# Patient Record
Sex: Female | Born: 1940 | Race: Black or African American | Hispanic: No | Marital: Single | Smoking: Former smoker
Health system: Southern US, Community
[De-identification: ages and names within clinical notes are randomized; demographics above are authoritative.]

## PROBLEM LIST (undated history)

## (undated) DIAGNOSIS — F329 Major depressive disorder, single episode, unspecified: Secondary | ICD-10-CM

## (undated) DIAGNOSIS — K219 Gastro-esophageal reflux disease without esophagitis: Secondary | ICD-10-CM

## (undated) DIAGNOSIS — M1990 Unspecified osteoarthritis, unspecified site: Secondary | ICD-10-CM

## (undated) DIAGNOSIS — K279 Peptic ulcer, site unspecified, unspecified as acute or chronic, without hemorrhage or perforation: Secondary | ICD-10-CM

## (undated) DIAGNOSIS — M519 Unspecified thoracic, thoracolumbar and lumbosacral intervertebral disc disorder: Secondary | ICD-10-CM

## (undated) DIAGNOSIS — K429 Umbilical hernia without obstruction or gangrene: Secondary | ICD-10-CM

## (undated) DIAGNOSIS — D649 Anemia, unspecified: Secondary | ICD-10-CM

## (undated) DIAGNOSIS — J449 Chronic obstructive pulmonary disease, unspecified: Secondary | ICD-10-CM

## (undated) DIAGNOSIS — K5790 Diverticulosis of intestine, part unspecified, without perforation or abscess without bleeding: Secondary | ICD-10-CM

## (undated) DIAGNOSIS — Z9981 Dependence on supplemental oxygen: Secondary | ICD-10-CM

## (undated) DIAGNOSIS — G4733 Obstructive sleep apnea (adult) (pediatric): Secondary | ICD-10-CM

## (undated) DIAGNOSIS — I10 Essential (primary) hypertension: Secondary | ICD-10-CM

## (undated) DIAGNOSIS — F32A Depression, unspecified: Secondary | ICD-10-CM

## (undated) DIAGNOSIS — K648 Other hemorrhoids: Secondary | ICD-10-CM

## (undated) DIAGNOSIS — D3000 Benign neoplasm of unspecified kidney: Secondary | ICD-10-CM

## (undated) DIAGNOSIS — M21079 Valgus deformity, not elsewhere classified, unspecified ankle: Secondary | ICD-10-CM

## (undated) DIAGNOSIS — D126 Benign neoplasm of colon, unspecified: Secondary | ICD-10-CM

## (undated) DIAGNOSIS — E7800 Pure hypercholesterolemia, unspecified: Secondary | ICD-10-CM

## (undated) HISTORY — DX: Chronic obstructive pulmonary disease, unspecified: J44.9

## (undated) HISTORY — PX: INCISE AND DRAIN ABCESS: PRO64

## (undated) HISTORY — DX: Diverticulosis of intestine, part unspecified, without perforation or abscess without bleeding: K57.90

## (undated) HISTORY — DX: Anemia, unspecified: D64.9

## (undated) HISTORY — PX: COLONOSCOPY: SHX174

## (undated) HISTORY — PX: TOTAL KNEE ARTHROPLASTY: SHX125

## (undated) HISTORY — DX: Obstructive sleep apnea (adult) (pediatric): G47.33

## (undated) HISTORY — DX: Benign neoplasm of unspecified kidney: D30.00

## (undated) HISTORY — DX: Umbilical hernia without obstruction or gangrene: K42.9

## (undated) HISTORY — PX: LIPOMA RESECTION: SHX23

## (undated) HISTORY — DX: Benign neoplasm of colon, unspecified: D12.6

## (undated) HISTORY — DX: Other hemorrhoids: K64.8

## (undated) HISTORY — DX: Valgus deformity, not elsewhere classified, unspecified ankle: M21.079

---

## 1997-06-16 ENCOUNTER — Encounter: Admission: RE | Admit: 1997-06-16 | Discharge: 1997-09-14 | Payer: Self-pay | Admitting: Specialist

## 1997-08-18 ENCOUNTER — Encounter: Admission: RE | Admit: 1997-08-18 | Discharge: 1997-11-16 | Payer: Self-pay | Admitting: Specialist

## 1997-10-08 ENCOUNTER — Encounter: Admission: RE | Admit: 1997-10-08 | Discharge: 1998-01-06 | Payer: Self-pay | Admitting: Specialist

## 1998-04-24 ENCOUNTER — Other Ambulatory Visit: Admission: RE | Admit: 1998-04-24 | Discharge: 1998-04-24 | Payer: Self-pay | Admitting: Internal Medicine

## 1998-05-07 ENCOUNTER — Encounter: Payer: Self-pay | Admitting: Internal Medicine

## 1998-05-07 ENCOUNTER — Ambulatory Visit (HOSPITAL_COMMUNITY): Admission: RE | Admit: 1998-05-07 | Discharge: 1998-05-07 | Payer: Self-pay | Admitting: Internal Medicine

## 1999-01-11 HISTORY — PX: UPPER GASTROINTESTINAL ENDOSCOPY: SHX188

## 1999-05-04 ENCOUNTER — Encounter: Payer: Self-pay | Admitting: Specialist

## 1999-05-04 ENCOUNTER — Ambulatory Visit (HOSPITAL_COMMUNITY): Admission: RE | Admit: 1999-05-04 | Discharge: 1999-05-04 | Payer: Self-pay | Admitting: Specialist

## 1999-05-18 ENCOUNTER — Ambulatory Visit (HOSPITAL_COMMUNITY): Admission: RE | Admit: 1999-05-18 | Discharge: 1999-05-18 | Payer: Self-pay | Admitting: Specialist

## 1999-05-18 ENCOUNTER — Encounter: Payer: Self-pay | Admitting: Specialist

## 1999-05-27 ENCOUNTER — Encounter: Admission: RE | Admit: 1999-05-27 | Discharge: 1999-05-27 | Payer: Self-pay | Admitting: Internal Medicine

## 1999-05-27 ENCOUNTER — Encounter: Payer: Self-pay | Admitting: Internal Medicine

## 1999-06-17 ENCOUNTER — Other Ambulatory Visit: Admission: RE | Admit: 1999-06-17 | Discharge: 1999-06-17 | Payer: Self-pay | Admitting: Internal Medicine

## 1999-07-28 ENCOUNTER — Encounter (INDEPENDENT_AMBULATORY_CARE_PROVIDER_SITE_OTHER): Payer: Self-pay | Admitting: *Deleted

## 1999-07-28 ENCOUNTER — Ambulatory Visit (HOSPITAL_COMMUNITY): Admission: RE | Admit: 1999-07-28 | Discharge: 1999-07-28 | Payer: Self-pay | Admitting: Gastroenterology

## 1999-11-11 ENCOUNTER — Encounter (INDEPENDENT_AMBULATORY_CARE_PROVIDER_SITE_OTHER): Payer: Self-pay | Admitting: Specialist

## 1999-11-11 ENCOUNTER — Ambulatory Visit (HOSPITAL_COMMUNITY): Admission: RE | Admit: 1999-11-11 | Discharge: 1999-11-11 | Payer: Self-pay | Admitting: Gastroenterology

## 1999-11-21 ENCOUNTER — Encounter (INDEPENDENT_AMBULATORY_CARE_PROVIDER_SITE_OTHER): Payer: Self-pay | Admitting: *Deleted

## 2000-06-06 ENCOUNTER — Encounter: Payer: Self-pay | Admitting: Internal Medicine

## 2000-06-06 ENCOUNTER — Encounter: Admission: RE | Admit: 2000-06-06 | Discharge: 2000-06-06 | Payer: Self-pay | Admitting: Internal Medicine

## 2001-06-08 ENCOUNTER — Encounter: Admission: RE | Admit: 2001-06-08 | Discharge: 2001-06-08 | Payer: Self-pay | Admitting: Internal Medicine

## 2001-06-08 ENCOUNTER — Encounter: Payer: Self-pay | Admitting: Internal Medicine

## 2002-03-20 ENCOUNTER — Encounter: Payer: Self-pay | Admitting: Specialist

## 2002-03-26 ENCOUNTER — Inpatient Hospital Stay (HOSPITAL_COMMUNITY): Admission: RE | Admit: 2002-03-26 | Discharge: 2002-03-29 | Payer: Self-pay | Admitting: Specialist

## 2002-03-26 ENCOUNTER — Encounter: Payer: Self-pay | Admitting: Specialist

## 2002-03-29 ENCOUNTER — Inpatient Hospital Stay (HOSPITAL_COMMUNITY)
Admission: RE | Admit: 2002-03-29 | Discharge: 2002-04-06 | Payer: Self-pay | Admitting: Physical Medicine & Rehabilitation

## 2002-06-19 ENCOUNTER — Encounter: Admission: RE | Admit: 2002-06-19 | Discharge: 2002-06-19 | Payer: Self-pay | Admitting: Internal Medicine

## 2002-06-19 ENCOUNTER — Encounter: Payer: Self-pay | Admitting: Internal Medicine

## 2002-10-15 ENCOUNTER — Encounter: Payer: Self-pay | Admitting: Specialist

## 2002-10-15 ENCOUNTER — Encounter: Admission: RE | Admit: 2002-10-15 | Discharge: 2002-10-15 | Payer: Self-pay | Admitting: Specialist

## 2003-11-25 ENCOUNTER — Encounter: Admission: RE | Admit: 2003-11-25 | Discharge: 2003-11-25 | Payer: Self-pay | Admitting: Internal Medicine

## 2005-01-10 HISTORY — PX: UMBILICAL HERNIA REPAIR: SHX196

## 2005-01-19 ENCOUNTER — Encounter: Admission: RE | Admit: 2005-01-19 | Discharge: 2005-01-19 | Payer: Self-pay | Admitting: Internal Medicine

## 2005-02-03 ENCOUNTER — Encounter: Admission: RE | Admit: 2005-02-03 | Discharge: 2005-02-03 | Payer: Self-pay | Admitting: Specialist

## 2005-11-03 ENCOUNTER — Encounter: Admission: RE | Admit: 2005-11-03 | Discharge: 2005-11-03 | Payer: Self-pay | Admitting: Specialist

## 2005-11-25 ENCOUNTER — Inpatient Hospital Stay (HOSPITAL_COMMUNITY): Admission: AD | Admit: 2005-11-25 | Discharge: 2005-12-02 | Payer: Self-pay | Admitting: General Surgery

## 2005-11-25 ENCOUNTER — Encounter (INDEPENDENT_AMBULATORY_CARE_PROVIDER_SITE_OTHER): Payer: Self-pay | Admitting: *Deleted

## 2005-11-25 ENCOUNTER — Encounter (INDEPENDENT_AMBULATORY_CARE_PROVIDER_SITE_OTHER): Payer: Self-pay | Admitting: Specialist

## 2006-01-04 ENCOUNTER — Encounter (INDEPENDENT_AMBULATORY_CARE_PROVIDER_SITE_OTHER): Payer: Self-pay | Admitting: *Deleted

## 2006-03-06 ENCOUNTER — Encounter: Admission: RE | Admit: 2006-03-06 | Discharge: 2006-03-06 | Payer: Self-pay | Admitting: Internal Medicine

## 2006-05-26 ENCOUNTER — Encounter: Admission: RE | Admit: 2006-05-26 | Discharge: 2006-05-26 | Payer: Self-pay | Admitting: Specialist

## 2006-10-10 ENCOUNTER — Inpatient Hospital Stay (HOSPITAL_COMMUNITY): Admission: RE | Admit: 2006-10-10 | Discharge: 2006-10-19 | Payer: Self-pay | Admitting: Specialist

## 2006-10-11 ENCOUNTER — Ambulatory Visit: Payer: Self-pay | Admitting: Physical Medicine & Rehabilitation

## 2007-03-09 ENCOUNTER — Encounter: Admission: RE | Admit: 2007-03-09 | Discharge: 2007-03-09 | Payer: Self-pay | Admitting: Internal Medicine

## 2008-01-16 ENCOUNTER — Ambulatory Visit (HOSPITAL_COMMUNITY): Admission: RE | Admit: 2008-01-16 | Discharge: 2008-01-16 | Payer: Self-pay | Admitting: Specialist

## 2008-03-10 ENCOUNTER — Encounter: Admission: RE | Admit: 2008-03-10 | Discharge: 2008-03-10 | Payer: Self-pay | Admitting: Internal Medicine

## 2008-06-27 ENCOUNTER — Ambulatory Visit (HOSPITAL_COMMUNITY): Admission: RE | Admit: 2008-06-27 | Discharge: 2008-06-27 | Payer: Self-pay | Admitting: Psychiatry

## 2009-01-10 DIAGNOSIS — D3000 Benign neoplasm of unspecified kidney: Secondary | ICD-10-CM

## 2009-01-10 HISTORY — DX: Benign neoplasm of unspecified kidney: D30.00

## 2009-03-20 ENCOUNTER — Ambulatory Visit (HOSPITAL_COMMUNITY): Admission: RE | Admit: 2009-03-20 | Discharge: 2009-03-20 | Payer: Self-pay | Admitting: Urology

## 2009-03-26 ENCOUNTER — Encounter: Admission: RE | Admit: 2009-03-26 | Discharge: 2009-03-26 | Payer: Self-pay | Admitting: Internal Medicine

## 2009-04-15 ENCOUNTER — Encounter: Admission: RE | Admit: 2009-04-15 | Discharge: 2009-04-15 | Payer: Self-pay | Admitting: Urology

## 2009-06-16 ENCOUNTER — Ambulatory Visit (HOSPITAL_COMMUNITY): Admission: RE | Admit: 2009-06-16 | Discharge: 2009-06-16 | Payer: Self-pay | Admitting: Interventional Radiology

## 2009-07-17 ENCOUNTER — Encounter (INDEPENDENT_AMBULATORY_CARE_PROVIDER_SITE_OTHER): Payer: Self-pay | Admitting: Interventional Radiology

## 2009-07-17 ENCOUNTER — Observation Stay (HOSPITAL_COMMUNITY)
Admission: RE | Admit: 2009-07-17 | Discharge: 2009-07-18 | Payer: Self-pay | Source: Home / Self Care | Admitting: Interventional Radiology

## 2009-08-07 ENCOUNTER — Encounter: Payer: Self-pay | Admitting: Internal Medicine

## 2009-08-26 ENCOUNTER — Ambulatory Visit (HOSPITAL_COMMUNITY): Admission: RE | Admit: 2009-08-26 | Discharge: 2009-08-26 | Payer: Self-pay | Admitting: Interventional Radiology

## 2009-08-26 ENCOUNTER — Encounter: Admission: RE | Admit: 2009-08-26 | Discharge: 2009-08-26 | Payer: Self-pay | Admitting: Interventional Radiology

## 2009-08-26 ENCOUNTER — Encounter (INDEPENDENT_AMBULATORY_CARE_PROVIDER_SITE_OTHER): Payer: Self-pay | Admitting: *Deleted

## 2010-01-31 ENCOUNTER — Encounter: Payer: Self-pay | Admitting: Interventional Radiology

## 2010-02-16 ENCOUNTER — Telehealth: Payer: Self-pay | Admitting: Radiology

## 2010-02-17 ENCOUNTER — Other Ambulatory Visit: Payer: Self-pay | Admitting: Interventional Radiology

## 2010-02-17 ENCOUNTER — Other Ambulatory Visit (HOSPITAL_COMMUNITY): Payer: Self-pay | Admitting: Interventional Radiology

## 2010-02-17 ENCOUNTER — Encounter (INDEPENDENT_AMBULATORY_CARE_PROVIDER_SITE_OTHER): Payer: Self-pay | Admitting: *Deleted

## 2010-02-17 DIAGNOSIS — N2889 Other specified disorders of kidney and ureter: Secondary | ICD-10-CM

## 2010-02-25 NOTE — Letter (Signed)
Summary: New Patient letter  [facility]  [address] [place], [place] [address]   Phone: [phone]  Fax: [phone]       02/17/2010 MRN: [id number]  [facility] [address] [place], [place]  [address]  Dear Ms. Lampton,  Welcome to the Gastroenterology Division at [facility].    You are scheduled to see Dr.  Carlean Purl on 03-23-10 at 3:00P.M. on the 3rd floor at [facility], [place] [employer].  We ask that you try to arrive at our office 15 minutes prior to your appointment time to allow for check-in.  We would like you to complete the enclosed self-administered evaluation form prior to your visit and bring it with you on the day of your appointment.  We will review it with you.  Also, please bring a complete list of all your medications or, if you prefer, bring the medication bottles and we will list them.  Please bring your insurance card so that we may make a copy of it.  If your insurance requires a referral to see a specialist, please bring your referral form from your primary care physician.  Co-payments are due at the time of your visit and may be paid by cash, check or credit card.     Your office visit will consist of a consult with your physician (includes a physical exam), any laboratory testing he/she may order, scheduling of any necessary diagnostic testing (e.g. x-ray, ultrasound, CT-scan), and scheduling of a procedure (e.g. Endoscopy, Colonoscopy) if required.  Please allow enough time on your schedule to allow for any/all of these possibilities.    If you cannot keep your appointment, please call [phone] to cancel or reschedule prior to your appointment date.  This allows [place] the opportunity to schedule an appointment for another patient in need of care.  If you do not cancel or reschedule by 5 p.m. the business day prior to your appointment date, you will be charged a $50.00 late cancellation/no-show fee.    Thank you for choosing [facility] for your medical needs.  We appreciate the opportunity to care for you.  Please visit [place] at our website  to learn more about our practice.                     Sincerely,                                                             The Gastroenterology Division

## 2010-03-02 ENCOUNTER — Ambulatory Visit
Admission: RE | Admit: 2010-03-02 | Discharge: 2010-03-02 | Disposition: A | Discharge status: Home / Self Care | Payer: MEDICARE | Source: Ambulatory Visit | Attending: Interventional Radiology | Admitting: Interventional Radiology

## 2010-03-02 ENCOUNTER — Ambulatory Visit (HOSPITAL_COMMUNITY)
Admission: RE | Admit: 2010-03-02 | Discharge: 2010-03-02 | Disposition: A | Discharge status: Home / Self Care | Payer: MEDICARE | Source: Ambulatory Visit | Attending: Interventional Radiology | Admitting: Interventional Radiology

## 2010-03-02 ENCOUNTER — Encounter (INDEPENDENT_AMBULATORY_CARE_PROVIDER_SITE_OTHER): Payer: Self-pay | Admitting: *Deleted

## 2010-03-02 VITALS — BP 195/78 | HR 72 | Temp 98.8°F | Resp 20 | Ht 61.0 in | Wt 200.0 lb

## 2010-03-02 DIAGNOSIS — N2889 Other specified disorders of kidney and ureter: Secondary | ICD-10-CM

## 2010-03-02 DIAGNOSIS — N3946 Mixed incontinence: Secondary | ICD-10-CM | POA: Insufficient documentation

## 2010-03-02 DIAGNOSIS — Z483 Aftercare following surgery for neoplasm: Secondary | ICD-10-CM | POA: Insufficient documentation

## 2010-03-02 DIAGNOSIS — C649 Malignant neoplasm of unspecified kidney, except renal pelvis: Secondary | ICD-10-CM | POA: Insufficient documentation

## 2010-03-02 DIAGNOSIS — R351 Nocturia: Secondary | ICD-10-CM | POA: Insufficient documentation

## 2010-03-02 DIAGNOSIS — R350 Frequency of micturition: Secondary | ICD-10-CM | POA: Insufficient documentation

## 2010-03-02 HISTORY — DX: Unspecified thoracic, thoracolumbar and lumbosacral intervertebral disc disorder: M51.9

## 2010-03-02 HISTORY — DX: Peptic ulcer, site unspecified, unspecified as acute or chronic, without hemorrhage or perforation: K27.9

## 2010-03-02 HISTORY — DX: Gastro-esophageal reflux disease without esophagitis: K21.9

## 2010-03-02 HISTORY — DX: Depression, unspecified: F32.A

## 2010-03-02 HISTORY — DX: Major depressive disorder, single episode, unspecified: F32.9

## 2010-03-02 HISTORY — DX: Unspecified osteoarthritis, unspecified site: M19.90

## 2010-03-02 HISTORY — DX: Essential (primary) hypertension: I10

## 2010-03-02 HISTORY — DX: Pure hypercholesterolemia, unspecified: E78.00

## 2010-03-02 MED ORDER — IOHEXOL 300 MG/ML  SOLN: 50.0000 mL | Freq: Once | INTRAMUSCULAR | Status: DC | PRN

## 2010-03-04 ENCOUNTER — Other Ambulatory Visit: Payer: Self-pay | Admitting: Interventional Radiology

## 2010-03-04 DIAGNOSIS — N2889 Other specified disorders of kidney and ureter: Secondary | ICD-10-CM

## 2010-03-04 NOTE — Progress Notes (Signed)
Encounter addended by: Andris Baumann on: 03/04/2010 11:48 AM<BR>     Documentation filed: Follow-up Section

## 2010-03-05 ENCOUNTER — Other Ambulatory Visit: Payer: Self-pay | Admitting: Internal Medicine

## 2010-03-05 DIAGNOSIS — Z1231 Encounter for screening mammogram for malignant neoplasm of breast: Secondary | ICD-10-CM

## 2010-03-23 ENCOUNTER — Ambulatory Visit (INDEPENDENT_AMBULATORY_CARE_PROVIDER_SITE_OTHER): Payer: MEDICARE | Admitting: Internal Medicine

## 2010-03-23 ENCOUNTER — Encounter: Payer: Self-pay | Admitting: Internal Medicine

## 2010-03-23 DIAGNOSIS — R159 Full incontinence of feces: Secondary | ICD-10-CM

## 2010-03-23 DIAGNOSIS — R198 Other specified symptoms and signs involving the digestive system and abdomen: Secondary | ICD-10-CM

## 2010-03-23 DIAGNOSIS — R194 Change in bowel habit: Secondary | ICD-10-CM

## 2010-03-23 NOTE — Discharge Summary (Signed)
Summary: Abdominal Wall Abcess  NAME:  [name], [name]                 ACCOUNT NO.:  [id number]   MEDICAL RECORD NO.:  [id number]          PATIENT TYPE:  INP   LOCATION:  20                         FACILITY:  [facility]   PHYSICIAN:  Sammuel Hines. Daiva Nakayama, M.D. DATE OF BIRTH:  [birth year]   DATE OF ADMISSION:  11/25/2005  DATE OF DISCHARGE:  12/02/2005                               DISCHARGE SUMMARY   HISTORY OF PRESENT ILLNESS:  [name] is a 70 year old diabetic female  who was admitted with strangulated umbilical hernia that had eroded  through the abdominal wall and created an abdominal wall abscess.   HOSPITAL COURSE:  She was taken to the operating room on November 16  where she underwent resection of strangulated omentum as well as her  umbilicus, incision and debridement of the abdominal wall, repair of the  umbilical hernia primarily and the wound was left open and packed with  dressings. Postoperatively, she did relatively well. Her diabetes was  controlled with insulin sliding scale. Her dressing changes were started  on postop day #1. Once her bowel function had returned, her diet was  gradually advanced. Home health nursing was arranged for dressing  changes at home and on November 23, she was tolerating a diet and ready  for home health dressing changes. She was out of bed ambulating and was  ready for discharge home.   DISCHARGE MEDICATIONS:  She is to resume her home medications, given a  prescription for Vicodin for pain.   DISCHARGE INSTRUCTIONS:  Activities:  No heavy lifting. Diet:  Diabetic  diet. Followup will be with Dr. Marlou Starks in 2 weeks.   FINAL DIAGNOSIS:  Abdominal wall abscess from strangulated umbilical  hernia.   CONDITION ON DISCHARGE:  Stable.      Sammuel Hines. Daiva Nakayama, M.D.  Electronically Signed     PST/MEDQ  D:  01/04/2006  T:  01/04/2006  Job:  [id number]   cc:   Sammuel Hines. Daiva Nakayama, M.D.  [id number]  [place]  [place] [address]

## 2010-03-23 NOTE — Progress Notes (Signed)
Subjective:    Patient ID: [name], female    DOB: 04/14/1940, 70 y.o.   MRN: [id number]  HPI  She has been having problems with defecating every times she urinates. Very small amount of stool comes out. She will also have an urge to defecate and have normal bowel movements. She believes she has had a colonoscopy in the past but does not remember where or by whom. Chart review shows 2001 colonoscopy and polypectomy by [facility] and she thinks there has been a subsequent one. She Sees Dr. Reynaldo Minium for primary care but Dr. Terrall Laity office referred her (his PA). She has been seeing them after a knee replacement.  She does think bowel habits are more urgent and frequent and has had incontinence. Stools are loose at times. No treatment tried. No rectal bleeding. Does have some gas and boating. She is unclear as to actual duration or onset of problems. She acknowledges memory disturbance.  Review of Systems    Unable to sleep lying down, arthritis and back pain, recent URI with cough, urinary frequency , pedal edema, uses a walker and a right leg brace for chronic foot deformities and gait problems Complete ROS otherwise negative except as in HPI   Past Medical History  Diagnosis Date  . Arthritis   . Lumbar disc disease   . Asthma   . Depression   . Diabetes mellitus   . GERD (gastroesophageal reflux disease)   . Hypercholesterolemia   . HTN (hypertension)   . Peptic ulcer disease   . Valgus foot     right foot  . Gastric ulcer   . Adenomatous colon polyp   . Diverticulosis   . Internal hemorrhoids   . 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 Anemia   . Anemia    History   Social History  . Marital Status: Single    Spouse Name: N/A    Number of Children: 0  . Years of Education: N/A   Occupational History  . Retired     Astronomer   Social History Main Topics  . Smoking status: Former Smoker -- 2.0 packs/day for 5 years    Types: Cigarettes    Quit date:  01/10/1958  . Smokeless tobacco: Not on file  . Alcohol Use: No     Quit when she quit smoking  . Drug Use: No  . Sexually Active: Not on file   Other Topics Concern  . 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  , Disp: , Rfl: ;  Carbonyl Iron (FEOSOL PO), One tablet by mouth once a day , Disp: , Rfl: ;  donepezil (ARICEPT) 5 MG tablet, 5 mg. 2 tablets by mouth daily , Disp: , Rfl: ;  esomeprazole (NEXIUM) 40 MG capsule, One tablet by mouth once a day , Disp: , Rfl:  exenatide (BYETTA 10 MCG PEN) 10 MCG/0.04ML SOLN, 79mcg subQ twice a day , Disp: , Rfl: ;  folic acid (FOLVITE) Q000111Q MCG tablet, 400 mcg. One tablet 1-2 x per week , Disp: , Rfl: ;  furosemide (LASIX) 40 MG tablet, 2 tablets by mouth daily , Disp: , Rfl: ;  glipiZIDE (GLUCOTROL) 10 MG tablet, One tablet by mouth twice a day , Disp: , Rfl:  HYDROcodone-acetaminophen (VICODIN ES) 7.5-750 MG per tablet, One tablet by  mouth 1-2 x daily as needed , Disp: , Rfl: ;  LORazepam (ATIVAN) 0.5 MG tablet, One tablet by mouth at bedtime , Disp: , Rfl: ;  metFORMIN (GLUCOPHAGE-XR) 500 MG 24 hr tablet, 2 tablets by mouth twice a day , Disp: , Rfl: ;  polyethylene glycol (MIRALAX / GLYCOLAX) packet, One capful daily , Disp: , Rfl:  potassium chloride (KLOR-CON) 20 MEQ packet, One tablet by mouth daily , Disp: , Rfl: ;  Sennosides (SENNA LAX PO), Take by mouth. One tablet by mouth once daily , Disp: , Rfl: ;  vitamin E 400 UNIT capsule, Take 400 Units by mouth daily.  , Disp: , Rfl: ;  methocarbamol (ROBAXIN) 500 MG tablet, One tablet by mouth every 8 hours as needed , Disp: , Rfl:  No Known Allergies  Family Hx: + diabetes and heart disease Objective:   Physical Exam     Elderly black woman , no acute distress   eyes anicteric and mouth and posterior pharynx clear Neck supple and no  mass, no cervical or supraclavicular adenopathy  lungs clear Heart S1-S2 no rubs murmurs or gallops   abdomen is protuberant soft and nontender without obvious mass   rectal exam with female staff present shows normal anoderm , slightly reduced resting and voluntary squeeze tone , slightly reduced descent with defecation simulation, Abdominal pressure appears adequate. No mass or signifcant rectocele. Ext: brace on RLE, everted feet with Charcot changes, able to walk with assistance, slow getting on table Neuro: memory inconsistent on recall, variable reporting on history. Affect: pleasant Skin: no rash      Assessment & Plan:   Problem List as of 03/23/2010        Fecal incontinence   Last Assessment & Plan Note   03/23/2010 Documentation Signed 03/23/2010  6:14 PM by Gatha Mayer    Urge incontinence in setting of more frequent defecation. Given her memory loss issues, ? Dementia, it is hard to sort this out. i do not have last colonoscopy report and have requested that. Some possibilities include overuse of MiraLax and/or Senna, side effects of Aricept. She is also on metformin. It seems like this has been a fairly long-standing problem. Rectal exam shows some decreased anal sphincter tone (subjective) but not that bad in my opinion. I do not think she is having urinary incontinence but may have an overactive bladder. Await record review prior to anything further. Anticipate contacting Dr. Reynaldo Minium also.    Change in bowel habits   Last Assessment & Plan Note   03/23/2010 Documentation Signed 03/23/2010  6:14 PM by Gatha Mayer    Increased frequency of defecation. Sometimes may be loose. Do not get sense that there is frank diarrhea but history seems somewhat unreliable. Aricept side effects, MiraLax/Senna may be part of the problem. Await review of previous colonoscopy.

## 2010-03-23 NOTE — Procedures (Signed)
Summary: COLON/Bx (Dr Earlean Shawl)    Colonoscopy  Procedure date:  11/21/1999  Findings:      Location:  [facility]  Patient:    [name], [name]                        MRN: [id number] Proc. Date: 11/11/99 Adm. Date:  [id number] Attending:  Harriette Bouillon CC:         Richard A. Reynaldo Minium, M.D.   Procedure Report  PROCEDURE:  Colonoscopic polypectomy.  INDICATION:  A 70 year old African-American female undergoing colonoscopy for neoplasia surveillance and to further evaluate symptoms of hematochezia with tenesmus and proctalgia.  No prior colorectal neoplasia surveillance.  DESCRIPTION OF PROCEDURE:  After reviewing the nature of the procedure with the patient, including potential risks and complications, and after discussing alternative methods of diagnosis and treatment, informed consent was signed.  The patient was premedicated, receiving Versed 7 mg, fentanyl 75 mcg IV, administered in divided doses prior to and during the course of the procedure.  Using an Olympus standard video colonoscope, I intubated after digital examination revealed external hemorrhoids.  The scope was inserted and advanced easily around the entire length of the colon to the cecum. Preparation was excellent throughout.  Cecum was identified by the appendiceal orifice and ileocecal valve.  The scope was slowly withdrawn, with careful inspection of the entire colon in a retrograde manner, including retroflex view in the rectal vault.  Photo documentation was obtained in the cecum.  Abnormalities included two polyps, one at 70 cm in the descending colon and a second at the anorectal verge.  Both were approximately 8 mm in diameter, sessile, and benign-appearing.  Both were resected using hot biopsy forceps, recovered, and submitted in their individual pathology container.  Moderate sigmoid diverticulosis was noted.  There was no inflammation.   No evidence of diverticular disease in any other segments of the colon.  Finally, in the rectum, upon retroflex view, internal hemorrhoids were noted at the anus.  These were noninflamed at this time.  The colon was decompressed, scope withdrawn.  The patient tolerated the procedure without difficulty, being maintained on Datascope monitor and low-flow oxygen throughout.  Time 1, technical 2, preparation 1, total score equals 4.  ASSESSMENT: 1. Colon polyps, benign-appearing.  Resected from the descending colon and    anorectal verge using hot biopsy forceps.  Recovered and submitted to    pathology.  Pathology pending. 2. Diverticulosis, moderate sigmoid involvement.  Noninflamed. 3. Internal hemorrhoids, probable source of hematochezia.  RECOMMENDATIONS: 1. Postpolypectomy instructions reviewed. 2. Follow up pathology. 3. Repeat colonoscopy in three years if adenoma. 4. Annual Hemoccult, per Dr. Reynaldo Minium. 5. Anusol-HC suppository one p.r. b.i.d. for seven days, repeat p.r.n.    for hematochezia. 6. ROV two week two review findings and pathology results with patient. DD:  11/11/99 TD:  11/12/99 Job: [id number]  ------------------------------------------------------------------------------------------------------------------------------------------------------  FINAL DIAGNOSIS    ***MICROSCOPIC EXAMINATION AND DIAGNOSIS***    I. COLON: ADENOMATOUS POLYP. NO HIGH GRADE DYSPLASIA OR   INVASIVE MALIGNANCY IDENTIFIED.    II. COLON: HYPERPLASTIC POLYP. NO ADENOMATOUS CHANGE OR   MALIGNANCY IDENTIFIED.    smr   Date Reported: 11/12/99 Louanne Belton. Rodney Cruise, MD   *** Electronically Signed Out By EAA ***    Clinical information  R/O neoplasm. (tmc)    specimen(s) obtained   1: Colon, polyp, descending   2: Rectum, polyp    Gross Description   I. Received in formalin is a tan, soft tissue fragment that is   submitted in toto. Size: 0.2 cm    II. Received in formalin is  a tan, soft tissue fragment that is   submitted in toto. Size: 0.2 cm   (JBM:smr 11/1)    smr/

## 2010-03-23 NOTE — Op Note (Signed)
Summary: I&D Abdominal Wall Abscess  NAME:  [name], [name]                 ACCOUNT NO.:  [id number]   MEDICAL RECORD NO.:  [id number]          PATIENT TYPE:  INP   LOCATION:  1611                         FACILITY:  [facility]   PHYSICIAN:  Sammuel Hines. Daiva Nakayama, M.D. DATE OF BIRTH:  Jun 04, 1940   DATE OF PROCEDURE:  11/25/2005  DATE OF DISCHARGE:                               OPERATIVE REPORT   PREOPERATIVE DIAGNOSIS:  Incarcerated umbilical hernia with abscess.   POSTOPERATIVE DIAGNOSIS:  Incarcerated umbilical hernia with abscess.   PROCEDURE:  I&D of abdominal wall abscess, resection of small piece of  omentum and primary repair of umbilical hernia.   SURGEON:  Sammuel Hines. Marlou Starks, M.D.   ANESTHESIA:  General endotracheal.   PROCEDURE:  After informed consent was obtained, the patient was brought  to the operating room, placed in supine position on the operating room  table.  After induction of general anesthesia, the patient's abdomen was  prepped with Betadine and draped in usual sterile manner.  The patient  had an open draining wound at the umbilicus.  The abscess cavity was  probed with a hemostat superiorly and inferiorly and the cavity was  opened along the midline sharply with a 10 blade knife and  electrocautery.  Once the cavity was opened, the umbilical hernia was  readily identified.  The contents within the hernia were incarcerated  and appeared to be strangulated and this strangulated hernia had eroded  through the base of the belly-button allowing an infection to form.  The  umbilicus was rather necrotic so the umbilicus around this hernia was  excised sharply with a 10 blade knife and Bovie electrocautery.  This  dissection was carried down to the base of the hernia sac.  The base of  the hernia sac as it joined with fascia of the anterior abdominal wall  was opened sharply with the electrocautery.  This allowed [place] to open the  sac.  It appeared as though only infarcted omentum  was within the sac.  This omentum was excised with the LigaSure.  Once this was accomplished,  the fascia was widely open.  We were able to inspect the contents of the  abdominal cavity.  No bowel appeared to be involved with this hernia at  all.  At this point, the rest of the wound was debrided sharply with the  electrocautery until healthy tissue remained.  The fascia of the  anterior abdominal wall was then closed with interrupted #1 Novofil  stitches.  The wound was then packed with saline-soaked Kerlix and  sterile dressings were applied.  The patient tolerated the procedure  well.  At the end of the case all needle, sponge and instrument counts  were correct.  The patient was then awakened and taken to recovery in  stable condition.      Sammuel Hines. Daiva Nakayama, M.D.  Electronically Signed     PST/MEDQ  D:  11/30/2005  T:  11/30/2005  Job:  [id number]

## 2010-03-23 NOTE — Assessment & Plan Note (Signed)
Urge incontinence in setting of more frequent defecation. Given her memory loss issues, ? Dementia, it is hard to sort this out. i do not have last colonoscopy report and have requested that. Some possibilities include overuse of MiraLax and/or Senna, side effects of Aricept. She is also on metformin. It seems like this has been a fairly long-standing problem. Rectal exam shows some decreased anal sphincter tone (subjective) but not that bad in my opinion. I do not think she is having urinary incontinence but may have an overactive bladder. Await record review prior to anything further. Anticipate contacting Dr. Reynaldo Minium also.

## 2010-03-23 NOTE — Procedures (Signed)
Summary: EGD (Dr Earlean Shawl)   EGD  Procedure date:  07/28/1999  Findings:      Location: [facility]                          [facility]  Patient:    [name], [name]                        MRN: [id number] Proc. Date: 07/28/99 Adm. Date:  [id number] Disc. Date: [id number] Attending:  Harriette Bouillon CC:         Richard A. Reynaldo Minium, M.D.                           Procedure Report  PROCEDURE:  Panendoscopy.  INDICATION FOR PROCEDURE:  A 70 year old African-American female with 3 month history of odynophagia. Gradually progressive. No dysphagia. Denies regurgitation or mild pyrosis relieved with Maalox. No prior GI evaluation other than remote history of upper GI x-ray and normal abdominal ultrasound July 1999. Risk factors include history of diabetes. The patient using Vioxx intermittently. No additional NSAID or aspirin use. No tobacco or alcohol.  DESCRIPTION OF PROCEDURE:  After reviewing the nature of the procedure with the patient including potential risks and complications and after discussing alternative methods of diagnosis and treatment, informed consent was signed.  The patient was premedicated receiving Versed 5 mg, fentanyl 50 mcg administered in divided doses prior to and during the course of the procedure.  Using the Olympus video endoscope, the proximal esophagus was intubated under direct vision. Normal oropharynx. No lesion of the epiglottis, vocal chords or piriform sinus. The proximal and mid esophagus were normal. The distal esophagus notable for erosive inflammation consistent with LA classification grade A. Mucosal Z line distinct at 38 cm without stricture. No significant hiatal hernia appreciated. No evidence of Barretts metaplasia or neoplasia.  The gastric fundus, body and antrum were normal. Pylorus symmetric.  The duodenal bulb and second portion normal. Retroflexed view of the angularis, lesser curve, gastric cardia and fundus  negative. Stomach decompressed, scope withdrawn.  The patient tolerated the procedure without difficulty being maintained on Datascope monitor and low flow oxygen throughout.  ASSESSMENT: 1. Erosive esophagitis classification grade A. Consistent with chronic    reflux disease. 2. Odynophagia--secondary to reflux esophagitis.  RECOMMENDATION: 1. Nexium 40 mg p.o. q.a.m. 2. Antireflux measures. 3. Return office visit at 1 month to assess symptomatic response. DD:  07/28/99 TD:  07/30/99 Job: [id number]

## 2010-03-23 NOTE — Assessment & Plan Note (Signed)
Increased frequency of defecation. Sometimes may be loose. Do not get sense that there is frank diarrhea but history seems somewhat unreliable. Aricept side effects, MiraLax/Senna may be part of the problem. Await review of previous colonoscopy.

## 2010-03-23 NOTE — Patient Instructions (Signed)
Created in [place]. Given to patient.

## 2010-03-28 LAB — CROSSMATCH
ABO/RH(D): B POS
Antibody Screen: NEGATIVE

## 2010-03-28 LAB — PROTIME-INR
INR: 1.04 (ref 0.00–1.49)
Prothrombin Time: 13.5 seconds (ref 11.6–15.2)

## 2010-03-28 LAB — GLUCOSE, CAPILLARY
Glucose-Capillary: 180 mg/dL — ABNORMAL HIGH (ref 70–99)
Glucose-Capillary: 226 mg/dL — ABNORMAL HIGH (ref 70–99)

## 2010-03-28 LAB — CBC
HCT: 27.6 % — ABNORMAL LOW (ref 36.0–46.0)
Hemoglobin: 9.7 g/dL — ABNORMAL LOW (ref 12.0–15.0)
MCH: 30.8 pg (ref 26.0–34.0)
MCHC: 35.3 g/dL (ref 30.0–36.0)
Platelets: 240 10*3/uL (ref 150–400)
RBC: 3.89 MIL/uL (ref 3.87–5.11)
RDW: 13.7 % (ref 11.5–15.5)
WBC: 7.7 10*3/uL (ref 4.0–10.5)

## 2010-03-28 LAB — BASIC METABOLIC PANEL
BUN: 49 mg/dL — ABNORMAL HIGH (ref 6–23)
CO2: 24 mEq/L (ref 19–32)
Calcium: 8.9 mg/dL (ref 8.4–10.5)
Chloride: 106 mEq/L (ref 96–112)
Chloride: 109 mEq/L (ref 96–112)
Creatinine, Ser: 1.63 mg/dL — ABNORMAL HIGH (ref 0.4–1.2)
GFR calc Af Amer: 38 mL/min — ABNORMAL LOW (ref >60)
GFR calc non Af Amer: 31 mL/min — ABNORMAL LOW (ref >60)
GFR calc non Af Amer: 37 mL/min — ABNORMAL LOW (ref >60)
Glucose, Bld: 186 mg/dL — ABNORMAL HIGH (ref 70–99)
Potassium: 3.9 mEq/L (ref 3.5–5.1)
Potassium: 4.4 mEq/L (ref 3.5–5.1)
Sodium: 137 mEq/L (ref 135–145)
Sodium: 137 mEq/L (ref 135–145)

## 2010-03-29 LAB — BASIC METABOLIC PANEL
BUN: 31 mg/dL — ABNORMAL HIGH (ref 6–23)
GFR calc non Af Amer: 47 mL/min — ABNORMAL LOW (ref >60)
Potassium: 4.4 mEq/L (ref 3.5–5.1)
Sodium: 142 mEq/L (ref 135–145)

## 2010-03-29 LAB — PROTIME-INR
INR: 1.13 (ref 0.00–1.49)
Prothrombin Time: 14.4 seconds (ref 11.6–15.2)

## 2010-03-29 LAB — CBC
HCT: 33.9 % — ABNORMAL LOW (ref 36.0–46.0)
Platelets: 266 10*3/uL (ref 150–400)
WBC: 6.9 10*3/uL (ref 4.0–10.5)

## 2010-03-29 LAB — CROSSMATCH: ABO/RH(D): B POS

## 2010-03-29 LAB — APTT: aPTT: 33 seconds (ref 24–37)

## 2010-03-29 LAB — ABO/RH: ABO/RH(D): B POS

## 2010-03-30 ENCOUNTER — Ambulatory Visit
Admission: RE | Admit: 2010-03-30 | Discharge: 2010-03-30 | Disposition: A | Discharge status: Home / Self Care | Payer: MEDICARE | Source: Ambulatory Visit | Attending: Internal Medicine | Admitting: Internal Medicine

## 2010-03-30 DIAGNOSIS — Z1231 Encounter for screening mammogram for malignant neoplasm of breast: Secondary | ICD-10-CM

## 2010-04-16 ENCOUNTER — Telehealth: Payer: Self-pay | Admitting: Internal Medicine

## 2010-04-16 DIAGNOSIS — Z860100 Personal history of colon polyps, unspecified: Secondary | ICD-10-CM | POA: Insufficient documentation

## 2010-04-16 NOTE — Telephone Encounter (Signed)
I now have seen records Last colonoscopy 2009 with 6 mm adenoma and diverticulosis, internal hemorrhoids were sclerosed Please ask her to schedule an REV if she is still having bowel problems

## 2010-04-19 ENCOUNTER — Encounter: Payer: Self-pay | Admitting: Internal Medicine

## 2010-04-19 NOTE — Telephone Encounter (Signed)
I spoke with the patient she is still having fecal incontinence.  She is scheduled for a follow up appt on 05/25/10 1:30

## 2010-05-25 ENCOUNTER — Ambulatory Visit (INDEPENDENT_AMBULATORY_CARE_PROVIDER_SITE_OTHER): Payer: MEDICARE | Admitting: Internal Medicine

## 2010-05-25 ENCOUNTER — Encounter: Payer: Self-pay | Admitting: Internal Medicine

## 2010-05-25 DIAGNOSIS — Z860100 Personal history of colon polyps, unspecified: Secondary | ICD-10-CM

## 2010-05-25 DIAGNOSIS — R413 Other amnesia: Secondary | ICD-10-CM

## 2010-05-25 DIAGNOSIS — R198 Other specified symptoms and signs involving the digestive system and abdomen: Secondary | ICD-10-CM

## 2010-05-25 DIAGNOSIS — R159 Full incontinence of feces: Secondary | ICD-10-CM

## 2010-05-25 DIAGNOSIS — R350 Frequency of micturition: Secondary | ICD-10-CM

## 2010-05-25 DIAGNOSIS — R194 Change in bowel habit: Secondary | ICD-10-CM

## 2010-05-25 NOTE — Assessment & Plan Note (Signed)
She has short-term memory problems. She is alert and oriented x3 today. However in talking to her it is evident she has short-term memory disturbance. Her psychiatrist (Dr. Plovsky)has prescribed the Aricept, she says is too expensive and she was not certain that helped anyway. I had thought it might have been interfering with her defecation habits though I really can't sort that out based on what she told me today. She will need to follow with primary care and her psychiatrist regarding these issues.

## 2010-05-25 NOTE — Assessment & Plan Note (Signed)
This is now improved and she is constipated. Question if could be related to her spinal stenosis though it is very hard to sort out based upon her limited ability to provide accurate history in the setting of memory disturbance.

## 2010-05-25 NOTE — Assessment & Plan Note (Signed)
I do not think she needs a routine repeat colonoscopy until around November 2014, depending upon her health status at that time.

## 2010-05-25 NOTE — Assessment & Plan Note (Signed)
She is having significant urinary frequency I asked her to discuss this with primary care again. She may need to see a urologist. The etiology of this is unclear to me at this time with her multiple possibilities. This is a chronic issue. Again motion if her spinal disease part of the problem.

## 2010-05-25 NOTE — Progress Notes (Signed)
Subjective:     Patient ID: [name], female   DOB: [birth year], 70 y.o.   MRN: [id number]  HPI69 yo bw with prior fecal incontinence. Now since starting a new blood pressure medication she has become constipated and is not having fecal incontinence. She is still urinating frequently and has had some incontinence. She has recevied epidural injections for spinal stenosis and is having less back pain. She has forgotten about her Miralax and has not taken in a while.  Review of Systems As above Using a walker    Objective:   Physical Exam Elderly bw NAD Alert and oriented x 4 but has short-term memory problems    Assessment:     See problem list    Plan:     As per problem-oriented charting

## 2010-05-25 NOTE — Discharge Summary (Signed)
NAME:  [name], [name] NO.:  [id number]   MEDICAL RECORD NO.:  [id number]          PATIENT TYPE:  INP   LOCATION:  5035                         FACILITY:  [place]   PHYSICIAN:  Jessy Oto, M.D.   DATE OF BIRTH:  Jul 19, 1940   DATE OF ADMISSION:  10/10/2006  DATE OF DISCHARGE:  10/18/2006                               DISCHARGE SUMMARY   ADMISSION DIAGNOSES:  1. Severe osteoarthritis, right knee, with tricompartmental changes.  2. Status post left total knee replacement, 2005.  3. Iron-deficiency anemia.  4. Diabetes mellitus.  5. Gastroesophageal reflux disease with remote history of gastric      ulcer.  6. Asthma.  7. Lumbar stenosis and spondylolisthesis at L3-L4, treated      conservatively.  8. 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   DISCHARGE DIAGNOSES:  1. Severe osteoarthritis, right knee, with tricompartmental changes.  2. Status post left total knee replacement, 2005.  3. Iron-deficiency anemia.  4. Diabetes mellitus.  5. Gastroesophageal reflux disease with remote history of gastric      ulcer.  6. Asthma.  7. Lumbar stenosis and spondylolisthesis at L3-L4, treated      conservatively.  8. 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  9. Posthemorrhagic anemia requiring blood transfusion.  10.Postoperative constipation, resolved at discharge  11.Deconditioning requiring prolonged physical therapy.   PROCEDURE:  On October 10, 2006, the patient underwent right total  knee arthroplasty, computer-assisted, with cemented DePuy components.  This was performed by Dr. Louanne Skye and assisted by Phillips Hay, [facility],  under general anesthesia.   CONSULTATIONS:  None.   BRIEF HISTORY:  [name] is a 70 year old black female well known to Dr.  Otho Ket practice having undergone left total knee arthroplasty in 2005.  She has done quite well from this.  She has developed progressive pain  in the right knee with radiographs showing  progressive degenerative  joint disease tricompartmentally.  She has had intra-articular steroid  injections as well as viscous supplementation therapy.  She is no longer  getting relief of her symptoms and is having difficulty with ambulation.  She also requires an AFO to the right lower extremity for severe  planovalgus foot deformity and is having difficulty ambulating secondary  to the pain in the knee.  It was felt that she would benefit from  surgical intervention and was admitted for the procedure as stated  above.   BRIEF HOSPITAL COURSE:  The patient tolerated the procedure under  general anesthesia without complications.  Postoperatively she was  placed on Coumadin for DVT and PE prophylaxis.  Adjustments in Coumadin  dose were made according to daily protimes by the pharmacy at [facility].  The patient's diabetes was treated with sliding scale  initially, and as she was able to have better oral intake and laboratory  values improved, she was placed back on her oral agents.  The patient  did have acute blood loss anemia which was monitored initially.  She was  restarted on her usual medication for her iron-deficiency anemia.  Hemoglobin did drop to 8.1 on the  second postoperative day, and 2 units  of autogenous blood was given.   Patient had an elevation in her white blood cell count on October 12, 2006, the second postoperative day, with episodes of desaturations to  79%.  She was started on handheld nebulizers as well as incentive  spirometry.  This did help with her pulmonary symptoms, and her white  blood cell count returned to normal value.  Chest x-ray was obtained and  showed central pulmonary vascular prominence with no segmental region of  consolidation.  The patient developed severe constipation during the  hospital stay.  KUB indicated normal bowel gas pattern with no  obstruction or ileus indicated.  She was started on MiraLax and Mylanta  as well as Dulcolax  suppositories and eventually was able to have bowel  movements independently.  The patient did complain of cramping-type  discomfort which was relieved with flatus and bowel movement.  At the  time of discharge, we are recommending a trial course of Levsin 0.25 mg  q.4 hours sublingually as needed for stomach cramping.   The patient's Foley catheter was discontinued on the third postoperative  day.  She was able to void independently after that.   Patient developed nausea on the fifth postoperative day.  The nausea was  relieved when she had further bowel movement, and this did require Fleet  enema.  She also was given Zofran which helped with her nausea.   Physical therapy was performed during the hospital stay on a daily  basis.  She was allowed 50% partial weightbearing on the right lower  extremity, wearing a knee immobilizer for ambulation.  Also she wore her  AFO to the right foot during physical therapy.  The patient was treated  for range of motion and strengthening exercises.  She was able to flex  to 60 degrees prior to discharge, both by the CPM machine and actively.  She did have full extension at the time of discharge of the right knee,  as well.  She was very slow progress with her activity level, and it was  felt that she may be a suitable candidate for inpatient rehabilitation.  However, due to insurance issues, it was felt that she would need to  have short-term nursing home placement for physical therapy to  facilitate her independence prior to discharge home.   After assistance by the case managers at the hospital, a bed was secured  at [facility].  The patient will be transferred on  October 18, 2006 in stable condition.  During the hospital stay, dressing  changes were done regularly, and her wound was found to be without  drainage or erythema.  She did have mild edema about the knee as  expected, and this was treated with ice.   OTHER PERTINENT  LABORATORY VALUES:  Patient's discharge hemoglobin and  hematocrit were 8.7 and 25.4, INR 1.6 at discharge.  She did receive 2  units of autologous blood during the hospital stay.  Preoperatively her  hemoglobin was 10.8 with hematocrit 31.4.  EKG on admission showed  normal sinus rhythm.   PLAN:  The patient will be admitted to [facility] for  continuation of her physical therapy.  She should receive range of  motion exercises to the right knee.  The goal for flexion is at least 90  degrees of flexion with full extension.  She has had some edema about  the knee, and this should be treated with ice packs  to help facilitate  her increasing range of motion.  She should have strengthening exercises  to the right knee and should be able to do a straight leg raise prior to  discharge to her home.  Knee immobilizer should be utilized for  ambulatory purposes only.  When she is ambulating, the knee immobilizer  should be placed on as well as the double upright AFO to the right foot.  The patient should be 50% partial weightbearing on the right lower  extremity until further notice.  She should receive physical therapy in  this capacity on a daily basis.  Occupational therapy would be helpful  in assisting with ADLs prior to discharge.  Dressing may be changed on  an as-needed basis.  She has Steri-Strips in place which will be allowed  to exfoliate spontaneously.  Wound may be wet for purposes of showering.  The patient should follow up with Dr. Louanne Skye in approximately 7-10 days  for a recheck.  His office can be notified at [phone] for arrangements  for the appointment, and patient will require transportation.  The  patient has had significant constipation during the hospital stay.  It  is most likely related to her narcotic analgesics.  Currently she is on  Percocet 5/325 one to two every 4-6 hours as needed for pain.  She  should continue to receive this for severe pain,  gradually weaning her  to Vicodin 1-2 every 4-6 hours as needed for mild-to-moderate pain.  Ice  packs also should be utilized for pain control and edema to the right  knee.   Other medications include:  1. Lasix 40 mg daily.  2. Protonix 80 mg daily.  3. Feosol 45-mg tablets daily.  4. Folic acid 1 mg daily.  5. Vitamin B6 at 100 mg daily.  6. Albuterol inhaler daily or as needed.  7. Colace 1 p.o. b.i.d.  8. Vitamin B1 at 100 mg p.o. daily.  9. Vitamin B12 at 100 mcg daily.  10.MiraLax 17 grams in 8 ounces of liquid daily.  11.Dulcolax suppository as needed.  12.OxyContin 5/325 one to two every 4-6 hours as needed for      breakthrough pain.  13.Glucophage 500 mg p.o. b.i.d.  14.Glucotrol XL 10 mg p.o. b.i.d.  15.Reglan 10 mg p.o. q.6-8 hours p.r.n. nausea.  16.Robaxin 500 mg 1 every 6-8 hours as needed for spasm.  17.Maalox 15-30 mL q.4 hours p.r.n.  18.She should remain on Coumadin for DVT prophylaxis for 4-6 weeks      postop.  Adjustments in Coumadin dose should be made according to      protimes at the nursing facility.  19.The patient is also on Byetta 10 mcg subcu b.i.d.   As stated above, weaning her pain medications may begin as soon as the  patient is able to tolerate this at the nursing facility.  If patient  continues to have difficulty with GI symptoms, she should have  arrangements made to see her primary care physician for further  evaluation and treatment.  If there are questions or concerns regarding  her orthopedic status, please notify Dr. Otho Ket office.  Also we could  please prescribe Levsin 0.25 mg sublingually every 4-6 hours as needed  for stomach discomfort and cramping.  This may be helpful for her GI  problems.   CONDITION ON DISCHARGE:  Stable.      [facility]      Jessy Oto, M.D.  Electronically Signed  SMV/MEDQ  D:  10/18/2006  T:  10/18/2006  Job:  [id number]

## 2010-05-25 NOTE — Op Note (Signed)
NAME:  [name], [name]                 ACCOUNT NO.:  [id number]   MEDICAL RECORD NO.:  [id number]          PATIENT TYPE:  INP   LOCATION:  2550                         FACILITY:  [facility]   PHYSICIAN:  Jessy Oto, M.D.   DATE OF BIRTH:  07/25/1940   DATE OF PROCEDURE:  10/10/2006  DATE OF DISCHARGE:                               OPERATIVE REPORT   PREOPERATIVE DIAGNOSIS:  Severe osteoarthritis right knee, medial joint  line, and patellofemoral joint greater than lateral joint line.   POSTOPERATIVE DIAGNOSIS:  Severe osteoarthritis right knee, medial joint  line, and patellofemoral joint greater than lateral joint line.   PROCEDURE:  1. Right computer-assisted total knee arthroplasty.  2. Cemented DePuy.  3. PFC femoral and tibial components with a 17.5 tibial tray, a 3-Peg,      32 mm patella component.   IMPLANT:  PFC Signal by DePuy.   SURGEON:  Jessy Oto, M.D.   ASSISTANT:  Epimenio Foot, P.A.-C   ANESTHESIA:  General via oral tracheal intubation by Jessy Oto  Frederick, M.D. supplemented with femoral block right lower extremity.   FINDINGS:  As above.   SPECIMENS:  Joint bone removed was not sent for pathology.   FINDINGS:  Consistent with severe medial and patellofemoral  osteoarthritis changes subchondral cysts of the medial tibial plateau  and sclerosis changes, particularly over the medial tibial plateau, and  posterior aspect of the medial plateau and femoral condyle.   ESTIMATED BLOOD LOSS:  123XX123 mL   COMPLICATIONS:  None.   DRAINS:  Foley to straight drain, Hemovac x1 right knee.   TOTAL TOURNIQUET TIME:  350 mmHg for 2 hours and 4 minutes.   DISPOSITION:  The patient returned to the PACU in good condition.   HISTORY OF PRESENT ILLNESS:  The patient is a 70 year old female who has  undergone previous left total knee arthroplasty 3 or 4 years ago with  Osteonics components cemented in place.  This knee has done very well.  She has a history of  planovalgus deformities of the feet and diabetes  mellitus, diet controlled with oral and hypoglycemic agents.  She has  undergone extensive treatment, conservatively, for progressive  degenerative joint disease of the right knee involving repetitive  steroid injections, the use of viscoelastic supplement therapy on a  continuous basis.  She wears an AFO for the right lower extremity for a  severe planovalgus foot deformity with collapse of the medial large.  She is brought to the operating room as she is beginning to experience  severe knee pain with night pain as well requiring the use of narcotic  medicines in addition to anti-inflammatory agents.  She has a history of  lumbar degenerative disk disease, and multilevel lumbar spinal stenosis  associated with Tanner spondylolisthesis.  Fortunately, her gait pattern  also is affecting her lumbar spine, and making it symptomatic as well.   DESCRIPTION OF PROCEDURE:  After adequate general anesthesia, with a  right femoral nerve block performed in preoperative holding area, the  patient underwent a standard prep of the right lower extremity involving  the right foot upwards to the right upper thigh, tourniquet about the  right upper thigh, lateral thigh, and lateral thigh bump as well as a  foot rest on the operating room table.  She was then draped in the usual  manner, an iodine Vi-Drape was used.  Right lower extremity was elevated  and exsanguinated with an Esmarch bandage, tourniquet inflated to 350  mmHg.  Leg in flexion.   Incision approximately 15-18 cm in length through the skin and  subcutaneous layers centered over the midportion of the distal  quadriceps mechanism midway between the midportion of the patella,  medial aspect of patella, in a central point, and then continuing along  the medial border of the patellar tendon and anterior tibial tubercle.  Incision through skin and subcutaneous layers down to the patient's   peritenon and periexternal retinacular layer.  This was incised in line  with the skin and then developed both medial lateral for later  reapproximation.   Incision then made into the superior border of the insertion of the  retinaculum of the knee and the quadriceps mechanism into the insertion  of the superior and medial border of the patella, superior pole.  This  was then carried medially, preserving cuff for later reattachment to the  external retinaculum here.  This was carried along the medial aspect of  the right patella tendon down to bone over the anterior proximal tibia  and along the medial aspect of the anterior tibial tubercle.   Incision was extended proximally in line with the medial third of the  quadriceps tendon.  The patient's synovial lining was then incised with  a 10-blade scalpel in line with the previous incision with the external  retinaculum of the knee, and continued downwards.  Patella was then  everted, and the knee flexed.  Large osteophytes circumferentially about  the femoral condyles were then resected using Leksell rongeur  intercondylar notch debrided of osteophytes as well; as well as the  proximal portion of the tibia.  The exposure of the proximal tibia using  electrocautery was continued medially with periosteal elevation of the  soft tissue structures including the MCL and medial retinaculum of the  knee off of the proximal medial aspect of the tibia, and over the  posterior medial corner of the knee to allow for full release of the  soft tissue structures medially in a severe varus knee.   Knee then subluxed and the intercondylar notch debrided of the visible  portions of the posterior cruciate ligament and anterior cruciate  ligament.  Both medial and lateral menisci were resected, 50% of the  posterior patella tendon fat pad was resected as well, as well as the  medial fat pad.  This allowed for exposure of the tibia surface  proximally, as  well as the distal femur.  The leg then brought into full  extension, and the Schanz screws placed for the computer rays.  Two  Schanz screws placed over the distal medial aspect of the right femur  obtaining a unicortical purchase, and just engaging the deep cortex.  The guide for placement of pins was used.   After placing the two pins, the two pins were then placed over the  midportion of the tibia on the right side medially.  This too was done  by first making an opening in the patient's dressing, and then applying  a body drape.  Stab incision with a 10-blade scalpel, then two Schanz  screws placed.  Each engaged in the superficial cortex, and then the  deeper cortex.  Essentially unicortical screws just engaging the deep  cortex.  Both the tibial and femoral arrays were then placed.  Knee  placed through range of motion to ensure that the arrays were visible  computer mechanism.  Site computer set up quite nicely.   Then registration of the proximal hip axis was then determined by  abduction and circumduction of the right hip obtaining enough  registration points to provide an accurate localization of the right  femoral head axis.  Then using the pointer, the tibial points were then  determined.  First determining the medial and lateral malleoli, the  proximal anterior cortex of the tibia, both the medial and lateral  tibial cut lines for the expected proximal tibia cut, the anterior and  posterior positions, the groove rotation on the surface was determined,  and the anterior cruciate ligament location of insertion as well.   Then registration obtained for the anterior aspect of the tibia and both  medial and lateral tibial plateau areas were registered.  Following  this, accurate registration points and determination of computer model  was verified to within 1 mm in all areas.  Attention was then turned to  registration of the femoral points.  Registration of the anterior  point  just proximal to the anterior surface of the femoral condyles was used.  This to be the expected coronal cut point for the anterior coronal cut  of the femoral condyles.   Next the medial and lateral epicondyles were located and localized and  registered.  The anterior point to the intercondylar notch registered as  well.  Then painting performed of the patient's distal medial femoral  condyle as well as the patient's distal lateral femoral condyle were  then painted.   After registration of these points, then registration the anterior  portion of the distal femur was performed.  After obtaining computer  model the verification was obtained within 1 mm of the expected values.  Then the verification device was used to check the anterior bow over the  distal femur.  This appeared to be neutral.  Radiographs suggested  neutral bow as well.  Therefore, this was not measured.  After  registration of each, the femur and tibia, and then determination of the  appropriate size of the components, and a #3 was chosen.  This was  placed against the end surface of the femur, and used to obtain adequate  verification of the correct size; a #4 appeared to be too large, and a  #3 appeared to be correct size.  With this then, a #3 cutting guide for  the proximal tibia was then aligned according to the expected proximal  tibia cut to be performed.  The expected cut would be approximately 5.3  on the medial surface and 11 mm from the lateral surface.   The tibial cutting jig with a verification guide placed was then  carefully aligned in both varus-and-valgus, internal-and-external  rotation, and flexion-extension and pinned into place into the proximal  tibia.  Once the jig was then placed, then a final third pin was placed  for stabilization purposes.  It protected the patellar tendon and soft  tissue structures, and the proximal tibia cut was made using oscillating  saw.  Verification was  within 1 mm of the expected cut surface, and 1-  degree of varus-valgus.  With this then the cutting jig was removed from  proximal tibia,  and then of the patient's extension-and-flexion gap were  examined.  Further release of the medial capsule portion of the knee was  carried out, especially with the posterior-and-medial aspect, and then  continued distally further in order to allow for further release of the  medial contracture of this knee, and this allowed for excellent release.   With this then, the patient's knee could be brought into varus that was  neutral, and valgus was neutral.  The extension-gap, flexion-gap  appeared to normalize quite nicely.  Using the extension-flexion gap  measurement then, we able were able to determine the expected distal  femoral cut.  Originally a 13.2 mm cut was decided by the computer, and  I elected to perform an 11.2 mm cut.  The cutting jig for the distal  femur with the verification guide in place was then carefully aligned  over the distal femur, a correct degree of varus-and-valgus, and a  correct degree of flexion-and-extension.  This was then pinned into  place.  A third pin was placed for further stabilization of the jig.   Protecting soft tissue structures, then a distal femoral cut transverse  was made.  Verification indicated further need for removal of posterior  bone which was found be quite hard, sclerotic, and this was done several  times until accuracy within 1-degree of varus-valgus, and 1 mm of  expected cut depth was obtained.  With this then, the cutting jig was  then removed from the distal femur, and the rotation guide placed over  the cut surface at the end of the femur.  Using this guide and the  computer, then the pins were then placed in the end of the femur for  placement of the cutting jig for the chamfer cuts and coronal cuts of  the distal femur.   A #3 cutting guide was then applied to the end of the femur, and   impacted using the holes provided by the rotation guide.  These were  held in place with clamps.  The anterior coronal cut and posterior  coronal cuts were performed, after first verifying the expected depth of  the anterior cut using the lobster claw.  After performing the anterior  cut, and verifying its depth, another additional cut was made over the  anterior surface to provide for accuracy.  This allowed for the cut to  be within 1 mm depth and in the correct degree of rotation.   Following this then, protecting the soft tissue structures posteriorly  with Cregos,  then the posterior coronal cuts were performed both in the  medial and lateral femoral condyles protecting posterior structures as  noted.  These were removed, and the posterior chamfer cuts, and anterior  chamfer cuts were performed without difficulty.  All excess bone  fragments were removed.  The cutting jig was then removed.  Tibia was  then subluxated, and the #3 plate for the proximal tibia was applied  after first debriding bone along the edges of the previous resected  proximal tibia.  Plate easily fit onto the proximal tibia in good  position and alignment.  Medialization was performed.  This was then  pinned into place, and the upright guide for the proximal reamer for the  proximal tibia was then applied.  Once this was applied, the reamer was  then used to ream the proximal femur in reverse.  An upright guide for  the reaming of the proximal tibia was removed, and the winged flanges as  well as the anterior central peg holes were then impacted into place  within the plate provided.  The plastic peg was then placed.  Cutting  jig for the distal femoral box cut was then applied to the end of the  femur, and aligned properly.  Then using oscillating saw, cutting in the  transverse direction, the transverse cut was made for the deepest  portion of the box cut.  This blade was then left in place, shifted both   medial and lateral.  All cutting was performed in the pacing sagittal  plane across the medial, lateral, femoral condyles.  We then removed the  box cut as provided.  This completed then, the trial femoral component  #3 was impacted into place.  Trial reduction was carried out, first with  10, then a 12.5 and then 15 mm inserts.  Originally 12.5 was chosen.   With the chosen implants the patella was then cut, first measuring the  expected width of the patella of 23 mm, a depth of 8 mm cut was chosen,  and the guide for cutting the patella was then set at 15 mm, leaving 15  mm of patella.  The cutting guide was then used to make coronal cut of  the patella.  This was done without difficulty.   When this was completed, then the 32 mm guide was used for drilling  holes over the posterior aspect of the patella with respected pegs for  the implant.  These measured a depth of 8 mm and provided excellent  fixation.  This completed, then attention was turned to the irrigation  and debridement of the knee joint.  Permanent implants were brought onto  the field, after range of motion of the knee demonstrated that the knee  could be extended and flexed to 130 degrees neutral, varus-and-valgus  alignment in full extension.  The knee was then irrigated with pulsatile  lavage.  Removing any debris from the knee joint itself, and irrigating  the knee of any soft tissue debris.  Cement was then mixed and brought  onto the field as well as permanent implants #3 femoral component, and a  #3 tibial tray for the proximal tibia.  Trial was to be performed at the  end of insertion of the implants.  A 32 mm patella implant was brought  onto the field.  With this then, cement was placed over the posterior  runners of the femur, and over the deep portions of the tibial tray  where the insert had recesses.   The knee was then subluxed anteriorly and the Caleb retractor was  placed.  This exposed the entire  surface of the cut surface of the  proximal tibia.  Cement was then placed over the entire surface, and  then within the central peg hole a small mat was placed.  The tibial  component was then packed into place, excess cement was removed;  impacted further, and further cement removed.  Cement was then placed  over the cut surface of the distal femur into the lug holes that were  drilled while the trial prosthesis was in place at the end of the femur.  A small amount was placed within the notch cut out of the distal femur,  and over the anterior surface of the femur as well.   With cement on the runners of the femoral component, this was then  impacted into place until it was fully seated.  Excess cement was  removed about  circumference of the femoral prosthesis.  A trial 12.5 mm  implant was then placed into the proximal tibial tray, and the knee  brought into extension.  Patella component was coated with cement as  were the holes of the posterior aspect of the patella.  The patella  component was then seated, and impacted into place using a spring-loaded  compressor.  This was held in place until the cement was completely  hardened over this, and as much excess cement around the circumference  of the patella as could be removed was removed.   The knee was brought into full extension, then using the computer, then  care taken to attempt to provide for full extension and neutral varus-  valgus with the cement in place.  When the cement had completely  hardened a small amount of cement off the medial aspect of patella  component, middle, and posterior patella was removed using osteotome.  Care was taken not to the damage the articular surface of the poly or of  the metal.  Circumferentially examination of both the femoral tibial  components demonstrated all excess cement had been removed; and the bow  prosthesis appeared to be well seated.  Irrigation was performed.  Tourniquet was  released.  The knee packed, after 5 minutes I removed the  packing, and there was no significant bleeding present.  Still, some  cauterization was carried out over the posterior midline, in the region  of the expected perforating vessels and geniculate vessels.  I examined  both the medial and lateral areas with geniculate arteries, and there  was no apparent bleeding apparent here.   Trial placement of components within the knee was carried with a 12.5  indicating further shuck than was felt to be appropriate, so that we  carried it up to a 15 mm insert, and then a 17.5 mm implant and this  provided the best degree of medial and lateral stability with very  little evidence of laxity in the medial lateral.  Shuck was reduced to a  very small degree with the knee in flexion at 90 degrees.   A 17.5 mm tibial insert tray was chosen.  This was brought onto the  field.  The trial was then removed, and the permanent implant was then  placed into the proximal tibia with the tibia subluxated, demonstrating  the whole surface of proximal tibia.  The knee reduced then and placed  at full range of motion and full extension at 0 degrees and even  hyperextension of 2-3 degrees possible, and flexion over 130 degrees.  Knee stable in varus and valgus and able to obtain a neutral varus-  valgus alignment with simple pressure on the knee.   With this being the case, then irrigation was carried out of the knee  and a medium Hemovac drain was placed in the depth incision.  The  synovial layer of the knee was then approximated with running stitch of  #0 Vicryl suture.  The distal quadriceps mechanism approximated with  interrupted #1 Vicryl sutures.  The external retinaculum of the knee  approximated over the medial patella, medial patella tendon border with  interrupted #1 Vicryl sutures.  Small frayed areas of medial aspect of  the patella tendon approximated with a single suture of #0 Ethilon.  The   peritenon was then approximated with interrupted #0 Vicryl sutures, deep  subcu layer was approximated with interrupted #0 Vicryl sutures, and  then 2-0 Vicryl sutures.   Skin closed  with a running subcu stitch of 4-0 Vicryl.  Tincture of  benzoin Steri-Strips applied, 4x4s, and ABD pad fixed to the skin with  sterile Kerlix and then an Ace wrap applied on the right leg.  Knee  immobilizer was applied.  The patient was then reactivated following  release of tourniquet.  Total tourniquet time when the tourniquet was  released after cementing was 2 hours and 4 minutes at 350 mmHg.  Final  observations regarding the right knee, showed neutral varus-valgus  alignment with ability to hyperextend the knee to 2 degrees and flexion  to 130 degrees.  The knee was felt to be quite stable.  The patient was  then returned to the recovery room in satisfactory condition.  All  instrument and sponge counts were correct.      Jessy Oto, M.D.  Electronically Signed     JEN/MEDQ  D:  10/10/2006  T:  10/10/2006  Job:  [id number]

## 2010-05-25 NOTE — Patient Instructions (Addendum)
#  1 restart MiraLax and take 4 doses tonight and then at least one dose every day until your bowels are moving regularly and continue that. #2 always bring the medicines you're taking in their bottles and in a bag to every doctor visit. #3 schedule a followup appointment with Dr. Reynaldo Minium and ask him about your bladder problems and whether you should see a bladder specialist #4 see Dr. Carlean Purl again in about 6 weeks, late June or early July. #5 Aricept was left on your medication list even though you are not taking - discuss this with Dr. Reynaldo Minium, also. It sounds like it did not help you and is too expensive, so it my not be worth it. I had thought it might have been interfering with your bowels but cannot tell for sure.

## 2010-05-25 NOTE — Assessment & Plan Note (Signed)
Now constipated. Seems possibly related to blood pressure medication which she is not certain of she will follow up primary care about her blood pressure and take MiraLax to try to relieve the constipation, she had stopped taking it. I don't think she needs any endoscopic evaluation at this time. Spinal stenosis could be another part of her problem here as well.

## 2010-05-26 ENCOUNTER — Telehealth: Payer: Self-pay | Admitting: Internal Medicine

## 2010-05-26 NOTE — Telephone Encounter (Signed)
Patient called to provide the names of her other meds Norvasc 5 mg and Zocor 40 mg both daily.  I have updated them on her medication list.

## 2010-05-27 NOTE — Telephone Encounter (Signed)
thanks

## 2010-05-28 NOTE — Op Note (Signed)
NAME:  [name], [name]                 ACCOUNT NO.:  [id number]   MEDICAL RECORD NO.:  [id number]          PATIENT TYPE:  INP   LOCATION:  1611                         FACILITY:  [facility]   PHYSICIAN:  Sammuel Hines. Daiva Nakayama, M.D. DATE OF BIRTH:  1940/06/12   DATE OF PROCEDURE:  11/25/2005  DATE OF DISCHARGE:                               OPERATIVE REPORT   PREOPERATIVE DIAGNOSIS:  Incarcerated umbilical hernia with abscess.   POSTOPERATIVE DIAGNOSIS:  Incarcerated umbilical hernia with abscess.   PROCEDURE:  I&D of abdominal wall abscess, resection of small piece of  omentum and primary repair of umbilical hernia.   SURGEON:  Sammuel Hines. Marlou Starks, M.D.   ANESTHESIA:  General endotracheal.   PROCEDURE:  After informed consent was obtained, the patient was brought  to the operating room, placed in supine position on the operating room  table.  After induction of general anesthesia, the patient's abdomen was  prepped with Betadine and draped in usual sterile manner.  The patient  had an open draining wound at the umbilicus.  The abscess cavity was  probed with a hemostat superiorly and inferiorly and the cavity was  opened along the midline sharply with a 10 blade knife and  electrocautery.  Once the cavity was opened, the umbilical hernia was  readily identified.  The contents within the hernia were incarcerated  and appeared to be strangulated and this strangulated hernia had eroded  through the base of the belly-button allowing an infection to form.  The  umbilicus was rather necrotic so the umbilicus around this hernia was  excised sharply with a 10 blade knife and Bovie electrocautery.  This  dissection was carried down to the base of the hernia sac.  The base of  the hernia sac as it joined with fascia of the anterior abdominal wall  was opened sharply with the electrocautery.  This allowed [place] to open the  sac.  It appeared as though only infarcted omentum was within the sac.  This omentum was  excised with the LigaSure.  Once this was accomplished,  the fascia was widely open.  We were able to inspect the contents of the  abdominal cavity.  No bowel appeared to be involved with this hernia at  all.  At this point, the rest of the wound was debrided sharply with the  electrocautery until healthy tissue remained.  The fascia of the  anterior abdominal wall was then closed with interrupted #1 Novofil  stitches.  The wound was then packed with saline-soaked Kerlix and  sterile dressings were applied.  The patient tolerated the procedure  well.  At the end of the case all needle, sponge and instrument counts  were correct.  The patient was then awakened and taken to recovery in  stable condition.      Sammuel Hines. Daiva Nakayama, M.D.  Electronically Signed     PST/MEDQ  D:  11/30/2005  T:  11/30/2005  Job:  [id number]

## 2010-05-28 NOTE — Discharge Summary (Signed)
NAME:  [name], [name]                           ACCOUNT NO.:  [id number]   MEDICAL RECORD NO.:  [id number]                   PATIENT TYPE:  INP   LOCATION:  5016                                 FACILITY:  [facility]   PHYSICIAN:  Jessy Oto, M.D.                DATE OF BIRTH:  05-18-1940   DATE OF ADMISSION:  03/26/2002  DATE OF DISCHARGE:  03/29/2002                                 DISCHARGE SUMMARY   ADMISSION DIAGNOSES:  1. Severe osteoarthritis, left knee.  2. Asthma.  3. Gastroesophageal reflux disease.  4. Hiatal hernia.  5. Type 2 diabetes mellitus.  6. History of iron deficiency anemia.  7. History of colon polyps.  8. Lactose-intolerance.   DISCHARGE DIAGNOSES:  1. Severe osteoarthritis, left knee.  2. Asthma.  3. Gastroesophageal reflux disease.  4. Hiatal hernia.  5. Type 2 diabetes mellitus.  6. History of iron deficiency anemia.  7. History of colon polyps.  8. Lactose-intolerance.  9. Post hemorrhagic anemia, requiring blood transfusion.  10.      Hyperglycemia.  11.      Hyperkalemia, resolved at discharge.  12.      Hyponatremia, resolved at discharge.   PROCEDURE:  On March 26, 2002, the patient underwent left total knee  arthroplasty utilizing [employer] posterior stabilized knee  replacement performed by Dr. Louanne Skye and assisted by Epimenio Foot, P.A.  under epidural anesthesia.   CONSULTATIONS:  Jarvis Morgan, M.D. of physical medicine and  rehabilitation.   HISTORY OF PRESENT ILLNESS:  The patient is a 70 year old black female with  longstanding history of osteoarthritis, bilateral knees.  She has also been  treated for bilateral valgus foot deformity with use of bilateral patella  tendon bearing AFO's.  The patient has failed conservative treatment for  severe osteoarthritis of bilateral knees.  She now requires narcotic  analgesics at all times for pain control.  Synvisc injections have been of  no help to the patient as well.  It was  felt that she would require surgical  intervention and was admitted for the procedure as stated above.   HOSPITAL COURSE:  The patient tolerated the procedure under epidural  anesthesia without difficulty. Her epidural remained indwelling for the  first postoperative days for pain control.  The patient did note itching on  the first postoperative day, however, the Nubain did help with her symptoms.  She had no nausea and vomiting postoperatively.  On the first postoperative  day, the patient was noted to have good motion of her ankles. She was able  to do a quad set as well.  She had difficulty tolerating the CPM machine as  0 to 30 degrees did cause her increased pain. She was noted to have elevated  blood sugars and was placed on sliding scale insulin. Hyperkalemia was noted  on the BMET postoperatively and her supplementation was held.  The patient  was placed on Coumadin for DVT and PE prophylaxis with adjustments in doses  made according to daily pro-times by the pharmacy at [facility].  Postoperative hemoglobin continued to decrease steadily and she did require  two units of autologous packed red blood cells for transfusion.  The patient  had hyponatremia which resolved following discontinuation of her IV fluids.  Hemovac drain was discontinued on the first postoperative day without  difficulty.  The patient's dressing change was done initially on the second  postoperative day and daily thereafter. She had excellent wound healing  without drainage. She continued to utilize ice to the knee.  The patient was  started on physical therapy for ambulation and gait training as well as  range of motion and strengthening exercises. Due to the fact that she did  live alone and needed to be independent at the time of discharge, a rehab  consult was obtained. She was felt to be a suitable candidate for inpatient  rehabilitation and was placed onto their waiting list. The patient  received  occupational therapy for ADL's and tolerated it well.  Fortunately, a bed  became available on the rehab unit on March 29, 2002, and the patient was  able to be transferred in stable condition. Prior to transfer, she was  making slow advancement with her physical therapy. She was afebrile and  vital signs were stable at the time of transfer.   LABORATORY DATA:  EKG on admission with normal sinus rhythm with short PR  and nonspecific ST and T wave abnormality with no old tracings for  comparison, confirmed by Peter M. Martinique, M.D.  Postoperative x-ray of the  left knee showed satisfactory position of left total knee replacement.   Urinalysis on March 18 was negative for urinary tract infection.  CBC on  admission was within normal limits.  The patient's hemoglobin dropped to the  lowest value of 8.7 with hematocrit 25.1 and she would receive two units of  autologous packed red blood cells. She tolerated the transfusion well.  Coagulation studies on admission were normal.  At the time of transfer, PT  was noted to be 16.9 with INR of 1.4.  Chemistry studies on admission were  within normal limits with the exception of BUN of 34 and glucose 311.  BMET  on March 17, revealed potassium of 5.8 which normalized by the next day with  discontinuation of her supplements.  Sodium was noted to be 128 on March 18.   PLAN:  Arrangements were made for the patient to be transferred to [facility].  She will be allowed weightbearing as  tolerated on the operative extremity. She should continue to receive range  of motion and strengthening exercises as well.  Occupational therapy will  assist with ADL's. She will remain on Coumadin for DVT and PE prophylaxis  for a total of six weeks postoperatively.  The patient did well utilizing  oral analgesics after her epidural was discontinued and she will continue on OxyContin 20 mg p.o. b.i.d. with OxyIR for breakthrough pain. A  list of her  remaining medications were sent with her to rehab. She did have some mild  low grade fevers during the hospital stay. Urine culture at the time of  transfer continued to be negative. She did receive albuterol breathing  treatments. She also was instructed in incentive spirometry.  Blood sugars  continued to be mildly elevated at the time of transfer. She will continue  on sliding  scale and Dr. Reynaldo Minium, her usual primary care physician, will be  notified to assist with her medication management. Dressing changes will be  done daily and clean dressing applied.  The patient's subcuticular stitch  will not require removal. She will be allowed to shower once there is no  further drainage from the wound. The patient will follow up with Dr. Louanne Skye  after her stay at the rehab center. If they have questions or concerns prior  to that time, Dr. Otho Ket office will be notified.     Epimenio Foot, P.A.                    Jessy Oto, M.D.    SMV/MEDQ  D:  04/29/2002  T:  04/30/2002  Job:  [id number]

## 2010-05-28 NOTE — Discharge Summary (Signed)
NAME:  [name], [name]                 ACCOUNT NO.:  [id number]   MEDICAL RECORD NO.:  [id number]          PATIENT TYPE:  INP   LOCATION:  1611                         FACILITY:  [facility]   PHYSICIAN:  Sammuel Hines. Daiva Nakayama, M.D. DATE OF BIRTH:  1940-02-28   DATE OF ADMISSION:  11/25/2005  DATE OF DISCHARGE:  12/02/2005                               DISCHARGE SUMMARY   HISTORY OF PRESENT ILLNESS:  [name] is a 70 year old diabetic female  who was admitted with strangulated umbilical hernia that had eroded  through the abdominal wall and created an abdominal wall abscess.   HOSPITAL COURSE:  She was taken to the operating room on November 16  where she underwent resection of strangulated omentum as well as her  umbilicus, incision and debridement of the abdominal wall, repair of the  umbilical hernia primarily and the wound was left open and packed with  dressings. Postoperatively, she did relatively well. Her diabetes was  controlled with insulin sliding scale. Her dressing changes were started  on postop day #1. Once her bowel function had returned, her diet was  gradually advanced. Home health nursing was arranged for dressing  changes at home and on November 23, she was tolerating a diet and ready  for home health dressing changes. She was out of bed ambulating and was  ready for discharge home.   DISCHARGE MEDICATIONS:  She is to resume her home medications, given a  prescription for Vicodin for pain.   DISCHARGE INSTRUCTIONS:  Activities:  No heavy lifting. Diet:  Diabetic  diet. Followup will be with Dr. Marlou Starks in 2 weeks.   FINAL DIAGNOSIS:  Abdominal wall abscess from strangulated umbilical  hernia.   CONDITION ON DISCHARGE:  Stable.      Sammuel Hines. Daiva Nakayama, M.D.  Electronically Signed     PST/MEDQ  D:  01/04/2006  T:  01/04/2006  Job:  [id number]   cc:   Sammuel Hines. Daiva Nakayama, M.D.  [id number]  [place]  [place] [address]

## 2010-05-28 NOTE — Op Note (Signed)
NAME:  [name], [name]                           ACCOUNT NO.:  [id number]   MEDICAL RECORD NO.:  [id number]                   PATIENT TYPE:  INP   LOCATION:  2550                                 FACILITY:  [facility]   PHYSICIAN:  Jessy Oto, M.D.                DATE OF BIRTH:  03/05/1940   DATE OF PROCEDURE:  03/26/2002  DATE OF DISCHARGE:                                 OPERATIVE REPORT   PREOPERATIVE DIAGNOSIS:  Left knee severe osteoarthritis.   POSTOPERATIVE DIAGNOSIS:  Left knee severe osteoarthritis.   PROCEDURE:  Left total knee cemented [employer]onics Scorpio posterior-stabilized  knee replacement utilizing a #9 femoral component, a #7 tibial component,  with a 10 mm tibial tray insert and a 26 mm patella component.  Lateral  retinacular release.   SURGEON:  Jessy Oto, M.D.   ASSISTANT:  Epimenio Foot, P.A.   ANESTHESIA:  Epidural anesthesia, Nelda Severe. Tobias Alexander, M.D.   ESTIMATED BLOOD LOSS:  200 mL.   DRAINS:  Hemovac left knee x1, Foley to straight drain.   BRIEF CLINICAL HISTORY:  The patient is a 70 year old female who has been  followed for at least eight years with findings of osteoarthritis in her  knee, severe bilateral planovalgus foot deformity.  She is being treated  conservatively for her foot deformity with the use of bilateral patellar  tendon-bearing (PTB) AFOs.  This to support her arch and decrease weight  transfer, both legs.  She does have diabetes mellitus and very little in the  way of peripheral neuropathy.  She has been treated clinically for  developing severe osteoarthritis changes of both medial compartments of both  knees.  She has gotten to the point where she requires narcotic medication  to control her pain along with anti-inflammatory agents.  Attempts at  conservative management, including Synvisc injection, have been unable to  control her pain.  She is brought to the operating room to undergo a left  total knee arthroplasty.   INTRAOPERATIVE FINDINGS:  Severe osteoarthritis changes involving the  patellofemoral joint and medial joint surface.  Moderate changes involving  the lateral joint line.  She had evidence of posterior medial tibial plateau  insufficiency where it was apparent that she may have had a very small  osteochondral injury in the past.  The anterior cruciate ligament was  attenuated.  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   DESCRIPTION OF PROCEDURE:  After adequate spinal anesthesia, the patient  brought to the operating room, the left lower extremity prepped with  Duraprep solution and standard preoperative antibiotics, draped in the usual  manner, and with the tourniquet about the left upper thigh.  An iodine Vi-  Drape was used.  The left lower extremity was then elevated and  exsanguinated with an Esmarch bandage, tourniquet inflated to 350 mmHg.  The  patient  then had incision made in the midline, left knee, extending from  just below the left anterior tibial tubercle, midline of the patella and in  midline with the expected quadriceps tendon proximally.  Incision made  approximately 20-25 cm in length through the skin and subcu layers directly  down to the external retinaculum of the knee.  This was incised in line with  the skin incision and then flaps developed both medial and lateral for  reapproximation at the end of the case.  Incision then made into the deep  retinaculum layers of the knee, then the superomedial pole of the patella  through-and-through the quadriceps tendon, then incised along the medial  border of the patella where reattachment of the cuff was preserved here,  then continued along the medial aspect of the patellar tendon distally down  to the medial aspect of the anterior tibial tubercle.  Incision carried  sharply down to the periosteum of the tibia, and this was incised along with  the fat pad centrally.  Then a  full-thickness flap then elevated over the  anterior medial aspect of the proximal tibia.  The medial meniscus was  incised and excised over its anterior two-thirds.  Then the pes anserinus  tendons were then elevated off the medial aspect of the proximal tibia along  with the proximal joint capsule.  This all the way to the posteromedial  corner of the tibia to allow for adequate mobilization and soft tissue  tensioning at the medial joint line, which was significantly arthritic and  narrowed.   The lateral joint line was then similarly exposed along the tibial plateau  anteriorly and laterally.   The lateral ligament of the patella was then incised.  A partial synovectomy  was performed along the medial joint line as well as lateral joint line of  the knee in order to remove the hypertrophic synovitis that was noted to be  present.   The incision carried into the quadriceps proximally, further freeing up the  patella to allow for its eversion and then externally rotated it out of the  field.  Rongeurs were then used to debride the distal femur of osteophytes  both medial and lateral and the intercondylar region as well.  The ACL was  then removed, excised using electrocautery, then subluxing the posterior  cruciate ligament, partially excised.  Drill hole then placed into the  distal femur in line with the intramedullary canal just at the anterior  aspect of the intercondylar notch.  Drilling was performed and then a step-  cut reamer was used to further drill the distal portion of the femur.  Intramedullary guide was then inserted in 5 degrees of valgus.  This was  then fixed to the distal femur using fixation pins in slight degree of  external rotation of 3-5 degrees.  It was lined up primarily with the  epicondylar axis of the femur.  Following pinning, then the intramedullary  guide was removed.  A cross pin was provided to provide even further fixation of the femoral component.   A transverse cut then made, protecting  the soft tissue sleeve of the knee by using the oscillating saw.  This was  then removed.   The end of the femur was then sized and felt to approximate a #9 cement stem  best.  The 9 guide was then used to place holes for placing the distal  femoral cutting jig.  These pin holes were then placed and then the #9  cutting guide impacted into place.  Anterior and posterior cuts were then  performed, protecting the soft tissue sleeve appropriately, and then  posterior chamfer cuts and then anterior chamfer cuts were made without  difficulty.   The knee was then subluxed forward.  The remaining portions of the posterior  cruciate ligament were excised.  The remnants of the menisci, both medial  and lateral, were excised in entirety.  The median eminence was then removed  using oscillating saw down to the tibial plateau surfaces and evening it.  A  drill guide was then used to place the drill into the proximal portion of  the tibia for the alignment guide, first using the initial drill and then  the step-cut reamer to drill further.  The intramedullary guide was placed  without difficulty, providing excellent stability of the guide.  The guide  then pinned to the proximal tibia after determining proper alignment, the  patient showing and demonstrating a planovalgus foot deformity, a tendency  for the foot to externally rotate.  Measurements were taken off of the ankle  joint itself, bisecting the ankle joint.  The patient did have some  tendencies to tibial external torsion.  The alignment was placed just medial  to the anterior tibial tubercle.   The guide then pinned into place.  Cutting was performed at 4 mm below the  medial joint surface, ascertaining that this provided an adequate amount of  resection of bony material using the alignment guide.  Note that the distal  femoral cut was performed in a +2 cut, indicating that we removed an  additional  2 mm from the distal femur with her femoral cut.  After cutting  the proximal tibia, the tibia was further subluxed and the posterior aspect  of the tibia carefully trimmed to ensure that a smooth, flat surface was  present.   The trial reduction was then performed after cutting the femoral component.  The distal intercondylar region was cut out of the femur using the guide for  providing for removal of the notch over the anterior aspect of the distal  femur and then using a notching guide and removing excess bone, then taking  bone, placing in the medullary guide position, and impacting this bone into  place.  Trial femur was then placed, the trial tibia placed using a 10 mm  insert.  This provided excellent stability.  No evidence of recurvatum, the  knee in full extension.  With medial and lateral tests, there was no  shucking present and there was no evidence of significant medial or lateral collateral instability.  With this, the permanent prostheses were chosen  with #7 cement tibia, #9 femur.  The cut was made for the patella using the  patellar cutting guide.  Reaming with a 26 mm guide, 26 mm patella was  chosen using the patient's native patella and determining that this was the  appropriate size.  Medialization was made of the patella using the guide.  A  10 mm depth was chosen.  Then drill holes were made for the pegs for the  prosthesis using a proper alignment guide within the patellar clamp once  this was removed and the trial prosthesis was placed and the patella then  trimmed to approximate the prosthesis and all osteophytes were resected.  The knee then placed through a range of motion demonstrating a tendency for  the patella to sublux laterally.  Lateral retinacular release was performed,  slightly more lateral than usual  as the patellar tendon itself was showing  some evidence of fraying laterally.  With the lateral retinacular release,  the patella tracked normally  within the distal femoral groove without thumb  pressure.  The knee was then re-dislocated.  The distal femoral prosthesis  was removed.  This was done following the correct alignment of the tibial  component and marking the anterior aspect of the proximal tibia for  placement of the permanent prosthesis.  After removing both tibial and  femoral components, then McCale retractors were placed and the tibia  subluxed.  The tibial plate, #7 plate, was then approximated  to the  proximal tibia, pinned in position and the osteotomes for making of the  cemented keel openings were then done.  Three in total were performed up to  a #7, 9 cement keel.  Once this was completed, then the plate was removed.  Irrigation was performed of the knee, examining and removing any loose  debris from the knee joint.  Cauterization was performed in the region of  the posterior aspect of the central portion of the notch and also over both  medial and lateral aspects of the posterior knee in the regions of expected  geniculate vessels.  Once cement had been completely mixed and the permanent  prostheses were on the field, the knee was then replaced into flexion, the  patella everted, and McCale retractors inserted.  Drying performed of the  proximal tibial.  Drill holes were then placed over the posterior medial  aspect of the knee over bone felt to be quite hard, and this allowed for  better cement annealing and cement to the medial tibia.  Cement then in a  semisolid state was then placed into the keel opening for the proximal tibia  following this drying, then over the proximal tibial surface and the  permanent implant tibial component was then impacted in place.  Excess  cement was then removed circumferentially.  Cement was then placed into the  holes in the end of the femur and both over both the distal portions of the  femoral condyles and the anterior aspect of the condyle.  This was also cement that was  placed over the posterior trays of the femoral component.  The femoral component then carefully aligned and impacted onto the end of  the femur without difficulty.  The excess cement was resected  circumferentially about the femoral prosthesis, excising the cement that had  extruded.  Carefully the areas for the pegs for the patellar component were  marked on the circumference of the patella.  The cement was then placed in  the area reamed over the retropatellar region.  The pegs were then inserted  in the proper orientation and the clamp used to squeeze excess cement from  the posterior aspect of the patellar component, and all excess cement was  then removed circumferentially.  The knee placed into full extension with  the 10 mm trial insert and with this in extension, then the cement was  allowed to fully anneal.  The cement, once it was completely hardened, then  the knee was then re-flexed and the patella clamp removed, excess cement  about the patella was removed using a quarter-inch osteotome, carefully  protecting the surface of the patella.  Additional excess cement along the  medial aspect of the femur was excised as well as over the posterior medial  aspect of the tibia.  The knee then was packed with sponges and the  tourniquet  released.  Total tourniquet time was 1 hour 52 minutes.  The  patient after some two or three minutes of hemostasis then underwent  cauterization of those areas of small bleeding over the subcu layers as well  as deep layers.  There was no significant bleeding from the region of the  geniculate vessels both medial and lateral.  It was evident that there was  bone bleeding from the area of the central portion of the distal femur cut  from the intercondylar region as well as over the posterior aspect of the  tibia.  These areas were hemostased using bone wax.  Additional Gelfoam was  placed over the posterior aspect of the knee.  The knee then hyperflexed  and  the McCale instruments used to maintain exposure.  Then the 10 mm posterior-  stabilized tibial insert was then inserted in place, impacted into place  without difficulty.  It appeared to seat quite nicely.  The knee was then  reduced, placed into full extension, was stable to varus and valgus stress,  demonstrated full extension and flexion to 130 degrees with the patella  showing no tendency to lateral subluxation following the lateral release  provided.  A single Hemovac drain was passed into the depth of the incision.  The deep retinaculum of the knee then reapproximated with interrupted #1  Vicryl sutures, more superficial layers with interrupted #1 and 0 Vicryl  suture, with the deep subcu layers approximated with interrupted 0 Vicryl  suture, the more superficial layers with interrupted 2-0 Vicryl suture, and  the skin closed with a running subcu stitch of 4-0 Monocryl.  Tincture of  Benzoin and Steri-Strips applied, 4 x 4's, ABD pad affixed to the skin with Kerlix, and an Ace wrap applied from the toes of the left foot to the upper  thigh.  The patient was then returned to the recovery room in satisfactory  condition.  All instrument and sponge counts were correct.  Note that a knee  immobilizer was placed at the end of the case.                                               Jessy Oto, M.D.    JEN/MEDQ  D:  03/26/2002  T:  03/27/2002  Job:  [id number]

## 2010-05-28 NOTE — Procedures (Signed)
[facility]  Patient:    [name], [name]                        MRN: [id number] Proc. Date: 11/11/99 Adm. Date:  [id number] Attending:  Harriette Bouillon CC:         Richard A. Reynaldo Minium, M.D.   Procedure Report  PROCEDURE:  Colonoscopic polypectomy.  INDICATION:  A 70 year old African-American female undergoing colonoscopy for neoplasia surveillance and to further evaluate symptoms of hematochezia with tenesmus and proctalgia.  No prior colorectal neoplasia surveillance.  DESCRIPTION OF PROCEDURE:  After reviewing the nature of the procedure with the patient, including potential risks and complications, and after discussing alternative methods of diagnosis and treatment, informed consent was signed.  The patient was premedicated, receiving Versed 7 mg, fentanyl 75 mcg IV, administered in divided doses prior to and during the course of the procedure.  Using an Olympus standard video colonoscope, I intubated after digital examination revealed external hemorrhoids.  The scope was inserted and advanced easily around the entire length of the colon to the cecum. Preparation was excellent throughout.  Cecum was identified by the appendiceal orifice and ileocecal valve.  The scope was slowly withdrawn, with careful inspection of the entire colon in a retrograde manner, including retroflex view in the rectal vault.  Photo documentation was obtained in the cecum.  Abnormalities included two polyps, one at 70 cm in the descending colon and a second at the anorectal verge.  Both were approximately 8 mm in diameter, sessile, and benign-appearing.  Both were resected using hot biopsy forceps, recovered, and submitted in their individual pathology container.  Moderate sigmoid diverticulosis was noted.  There was no inflammation.  No evidence of diverticular disease in any other segments of the colon.  Finally, in the rectum, upon retroflex view, internal  hemorrhoids were noted at the anus.  These were noninflamed at this time.  The colon was decompressed, scope withdrawn.  The patient tolerated the procedure without difficulty, being maintained on Datascope monitor and low-flow oxygen throughout.  Time 1, technical 2, preparation 1, total score equals 4.  ASSESSMENT: 1. Colon polyps, benign-appearing.  Resected from the descending colon and    anorectal verge using hot biopsy forceps.  Recovered and submitted to    pathology.  Pathology pending. 2. Diverticulosis, moderate sigmoid involvement.  Noninflamed. 3. Internal hemorrhoids, probable source of hematochezia.  RECOMMENDATIONS: 1. Postpolypectomy instructions reviewed. 2. Follow up pathology. 3. Repeat colonoscopy in three years if adenoma. 4. Annual Hemoccult, per Dr. Reynaldo Minium. 5. Anusol-HC suppository one p.r. b.i.d. for seven days, repeat p.r.n.    for hematochezia. 6. ROV two week two review findings and pathology results with patient. DD:  11/11/99 TD:  11/12/99 Job: [id number]

## 2010-05-28 NOTE — Procedures (Signed)
[facility]  Patient:    [name], [name]                        MRN: [id number] Proc. Date: 07/28/99 Adm. Date:  [id number] Disc. Date: [id number] Attending:  Harriette Bouillon CC:         Richard A. Reynaldo Minium, M.D.                           Procedure Report  PROCEDURE:  Panendoscopy.  INDICATION FOR PROCEDURE:  A 70 year old African-American female with 3 month history of odynophagia. Gradually progressive. No dysphagia. Denies regurgitation or mild pyrosis relieved with Maalox. No prior GI evaluation other than remote history of upper GI x-ray and normal abdominal ultrasound July 1999. Risk factors include history of diabetes. The patient using Vioxx intermittently. No additional NSAID or aspirin use. No tobacco or alcohol.  DESCRIPTION OF PROCEDURE:  After reviewing the nature of the procedure with the patient including potential risks and complications and after discussing alternative methods of diagnosis and treatment, informed consent was signed.  The patient was premedicated receiving Versed 5 mg, fentanyl 50 mcg administered in divided doses prior to and during the course of the procedure.  Using the Olympus video endoscope, the proximal esophagus was intubated under direct vision. Normal oropharynx. No lesion of the epiglottis, vocal chords or piriform sinus. The proximal and mid esophagus were normal. The distal esophagus notable for erosive inflammation consistent with LA classification grade A. Mucosal Z line distinct at 38 cm without stricture. No significant hiatal hernia appreciated. No evidence of Barretts metaplasia or neoplasia.  The gastric fundus, body and antrum were normal. Pylorus symmetric.  The duodenal bulb and second portion normal. Retroflexed view of the angularis, lesser curve, gastric cardia and fundus negative. Stomach decompressed, scope withdrawn.  The patient tolerated the procedure without difficulty being maintained  on Datascope monitor and low flow oxygen throughout.  ASSESSMENT: 1. Erosive esophagitis classification grade A. Consistent with chronic    reflux disease. 2. Odynophagia--secondary to reflux esophagitis.  RECOMMENDATION: 1. Nexium 40 mg p.o. q.a.m. 2. Antireflux measures. 3. Return office visit at 1 month to assess symptomatic response. DD:  07/28/99 TD:  07/30/99 Job: [id number]

## 2010-05-28 NOTE — Discharge Summary (Signed)
NAME:  [name], [name]                           ACCOUNT NO.:  [id number]   MEDICAL RECORD NO.:  [id number]                   PATIENT TYPE:  IPS   LOCATION:  [id number]                                 FACILITY:  [facility]   PHYSICIAN:  Meredith Staggers, M.D.             DATE OF BIRTH:  June 15, 1940   DATE OF ADMISSION:  03/29/2002  DATE OF DISCHARGE:  04/06/2002                                 DISCHARGE SUMMARY   DISCHARGE DIAGNOSES:  1. 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  2. Postoperative anemia.  3. Pain management.  4. Coumadin for deep venous thrombosis prophylaxis.  5. Noninsulin-dependent diabetes mellitus with poor control.  6. Diverticulosis.  7. Hypertension.  8. Mild renal insufficiency, suspect secondary to diabetes mellitus.   HISTORY OF PRESENT ILLNESS:  A 70 year old black female admitted on March 26, 2002, with progressive left knee pain.  X-rays with tricompartmental  arthritis.  No relief with conservative care.  Underwent a left total knee  replacement on March 26, 2002, per Jessy Oto, M.D.  Placed on Coumadin  for deep venous thrombosis prophylaxis and weightbearing as tolerated.  Postoperative anemia of 8.1 and transfused two units of packed red blood  cells on March 29, 2002.  Minimum to moderate assist for ambulation.  Latest  blood sugar 232-339.  INR 1.4.  Hemoglobin 8.1.  BUN 22, creatinine 1.3 on  March 29, 2002.   PAST MEDICAL HISTORY:  1. Noninsulin-dependent diabetes mellitus with poor control.  2. Diverticulosis.  3. Hypertension.  4. Mild renal insufficiency, suspect secondary to diabetes mellitus.   PAST SURGICAL HISTORY:  Fatty tumor removed from the neck.   ALLERGIES:  None.   SOCIAL HISTORY:  No alcohol or tobacco.  Lives alone in [place], [place].  Independent with a cane at times.  She is a Air traffic controller.  One-level home with one step to entry.  Local family  in [place], [place].   PRIMARY MEDICAL DOCTOR:  Burnard Bunting, M.D.   MEDICATIONS PRIOR TO ADMISSION:  1. Maxzide 25 mg daily.  2. Nexium 40 mg daily.  3. Glucophage 500 mg two tablets twice daily.  4. Glucotrol XL 10 mg daily.  5. Avandia 8 mg daily.  6. Celebrex 200 mg twice daily.   HOSPITAL COURSE:  The patient did well while on rehabilitation services with  therapies initiated on a b.i.d. basis.  The following issues were following  during the patient's rehabilitation course:  Pertaining to the patient's  left total knee replacement, the surgical site was healing nicely.  No signs  of infection.  The staples were to be removed prior to discharge from the  hospital.  She was ambulating independently in her room with weightbearing  as tolerated.  CPM machine 0-85 degrees.  Maintained on Coumadin for deep  venous thrombosis prophylaxis.  Her calves remained cool without any  swelling, erythema, and nontender.  Latest INR 1.5.  She was to receive 12.5  mg of Coumadin on April 05, 2002.  She would be followed by [facility] to complete Coumadin protocol.  The latest hemoglobin was 9.1 and  hematocrit 26.7.  No bleeding episodes.  Pain control with OxyContin  Continued Release, which was adjusted accordingly and would be tapered at  discharge.  She had a history of noninsulin-dependent diabetes mellitus.  Upon admission to rehabilitation services, she was on Glucotrol XL 10 mg  every day.  This was increased to 20 mg one April 02, 2002.  Glucophage 1000  mg twice daily was increased to 1250 mg twice daily on April 03, 2002.  Also  continued on Avandia 8 mg at bedtime.  Initial attempts for NPH Insulin due  to elevated blood sugars of 291, 302, and 294.  The patient was quite  concerned on need for home insulin.  She wanted to make full attempts at  p.o. medications.  Her insulin was discontinued.  She did gain somewhat  better control after her Glucophage was  increased.  The latest blood sugars  were 147, 160, and 126.  She received full diabetic teaching.  There was  concern she may need insulin in the future and will follow up with Burnard Bunting, M.D.  She also can discuss weight loss diets.  Her blood pressures  remained controlled on Maxzide, although the initial BUN on admission to  rehabilitation services was mildly elevated at 36.  Again, this was repeated  on the day prior to discharge to be 38.  Her Maxzide was held on April 05, 2002.  She would need follow up with her primary M.D. concerning monitoring  of blood pressure with Maxzide on hold, continuing of Glucophage while BUN  elevated with follow-up chemistries.  Consideration to be made for insulin  therapy.  Overall for her functional mobility, as noted above, she was  independent in her room, needing some assistance for lower body dressing.  Home health therapies have been arranged.   DISCHARGE MEDICATIONS:  1. Coumadin.  She received 12.5 mg on April 05, 2002.  She will continue     Coumadin until April 26, 2002.  2. OxyContin Continued Release 20 mg twice daily x 1 week, then 10 mg x 1     week, and then discontinue.  3. Trinsicon twice daily.  4. Maxzide on hold.  5. Protonix 80 mg daily.  6. Avandia 8 mg at bedtime.  7. Glucotrol XL 20 mg daily.  8. Glucophage 1250 mg twice day.  9. Oxycodone Immediate Release for pain.   ACTIVITY:  Weightbearing as tolerated.   DIET:  1800 calorie ADA.   SPECIAL INSTRUCTIONS:  Home health nurse per [facility] to check INR  on Monday, April 08, 2002, to complete Coumadin protocol.  Home health  physical and occupational therapy per [facility].  The patient should follow up with Burnard Bunting, M.D., for follow-up  chemistries to monitor BUN with latest of 38 on April 05, 2002.  Maxzide was  on hold.  Follow-up blood sugar and consideration to be made for insulin  therapy.    Lauraine Rinne, P.A.                      Meredith Staggers, M.D.    DA/MEDQ  D:  04/05/2002  T:  04/06/2002  Job:  [id number]   cc:   Burnard Bunting, M.D.  [address]  [place]  [place] [address]  Fax: [phone]   Jessy Oto, M.D.  [address]  [place]  [place] [address]  Fax: [phone]

## 2010-07-08 ENCOUNTER — Other Ambulatory Visit (HOSPITAL_COMMUNITY): Payer: Self-pay | Admitting: Interventional Radiology

## 2010-07-08 ENCOUNTER — Other Ambulatory Visit: Payer: Self-pay | Admitting: Interventional Radiology

## 2010-07-08 DIAGNOSIS — N2889 Other specified disorders of kidney and ureter: Secondary | ICD-10-CM

## 2010-07-12 ENCOUNTER — Encounter: Payer: Self-pay | Admitting: Internal Medicine

## 2010-07-12 ENCOUNTER — Ambulatory Visit (INDEPENDENT_AMBULATORY_CARE_PROVIDER_SITE_OTHER): Payer: Medicare Other | Admitting: Internal Medicine

## 2010-07-12 DIAGNOSIS — R413 Other amnesia: Secondary | ICD-10-CM

## 2010-07-12 DIAGNOSIS — R32 Unspecified urinary incontinence: Secondary | ICD-10-CM | POA: Insufficient documentation

## 2010-07-12 DIAGNOSIS — R159 Full incontinence of feces: Secondary | ICD-10-CM

## 2010-07-12 NOTE — Patient Instructions (Signed)
You have  An appointment with Dr. Diona Fanti at [facility] on July 18th at 1:45 pm please arrive at 1:30 pm. Follow up with Dr. Carlean Purl as needed.

## 2010-07-12 NOTE — Progress Notes (Signed)
  She returns having persistent urge fecal and urinary incontinence. She is not sure if she has a urology appointment scheduled. She has been back to her primary care physician and think she has some diabetic testing pending but is really not certain what is pending for her. She does realize that she is to see the radiologist about follow up of her renal oncocytoma soon.  She is awakened she is alert and oriented x3. She did tell me what she had for lunch yesterday though she struggled to remember that a little bit. She says she has not been lost or significantly confused recently.

## 2010-07-13 NOTE — Assessment & Plan Note (Signed)
?   Due to spinal stenosis or other Seeing urology soon

## 2010-07-13 NOTE — Assessment & Plan Note (Signed)
Still has - ? From spinal stenosis vs. Pelvic floor issues I do not think I can help further Await GU evaluation

## 2010-07-13 NOTE — Assessment & Plan Note (Signed)
Having some difficulty with recent events still but was alert and oriented x 3

## 2010-07-20 ENCOUNTER — Ambulatory Visit (HOSPITAL_COMMUNITY)
Admission: RE | Admit: 2010-07-20 | Discharge: 2010-07-20 | Disposition: A | Discharge status: Home / Self Care | Payer: Medicare Other | Source: Ambulatory Visit | Attending: Interventional Radiology | Admitting: Interventional Radiology

## 2010-07-20 DIAGNOSIS — N2889 Other specified disorders of kidney and ureter: Secondary | ICD-10-CM

## 2010-07-20 DIAGNOSIS — K8020 Calculus of gallbladder without cholecystitis without obstruction: Secondary | ICD-10-CM | POA: Insufficient documentation

## 2010-07-20 DIAGNOSIS — N289 Disorder of kidney and ureter, unspecified: Secondary | ICD-10-CM | POA: Insufficient documentation

## 2010-07-28 ENCOUNTER — Ambulatory Visit
Admission: RE | Admit: 2010-07-28 | Discharge: 2010-07-28 | Disposition: A | Discharge status: Home / Self Care | Payer: Medicare Other | Source: Ambulatory Visit | Attending: Interventional Radiology | Admitting: Interventional Radiology

## 2010-07-28 DIAGNOSIS — N2889 Other specified disorders of kidney and ureter: Secondary | ICD-10-CM

## 2010-07-29 NOTE — Progress Notes (Signed)
YRLY F/U RT RENAL ABLATION.  MISSED HER APPT W/ DR DAHLSTEDT TODAY.  STILL C/C/ OF LBP AND RT LAT. ABD PAIN/DISCOMFORT.  SHE IS ON NEW MEDS FOR MEMORY, BUT CANT REMEMBER THE NAME OF THE MEDICINE.  SHE ALSO HAS WORSENING RENAL FX.  WILL SEE PT BACK IN A YR W/ ANOTHER SCAN.

## 2010-08-03 NOTE — Telephone Encounter (Signed)
See telephone encounter.

## 2010-09-28 ENCOUNTER — Ambulatory Visit (HOSPITAL_COMMUNITY)
Admission: RE | Admit: 2010-09-28 | Discharge: 2010-09-28 | Disposition: A | Discharge status: Home / Self Care | Payer: Medicare Other | Source: Ambulatory Visit | Attending: Urology | Admitting: Urology

## 2010-09-28 ENCOUNTER — Other Ambulatory Visit: Payer: Self-pay | Admitting: Urology

## 2010-09-28 DIAGNOSIS — I517 Cardiomegaly: Secondary | ICD-10-CM | POA: Insufficient documentation

## 2010-09-28 DIAGNOSIS — D49519 Neoplasm of unspecified behavior of unspecified kidney: Secondary | ICD-10-CM

## 2010-09-28 DIAGNOSIS — I10 Essential (primary) hypertension: Secondary | ICD-10-CM | POA: Insufficient documentation

## 2010-09-28 DIAGNOSIS — D4959 Neoplasm of unspecified behavior of other genitourinary organ: Secondary | ICD-10-CM | POA: Insufficient documentation

## 2010-09-28 DIAGNOSIS — E119 Type 2 diabetes mellitus without complications: Secondary | ICD-10-CM | POA: Insufficient documentation

## 2010-09-28 DIAGNOSIS — I2789 Other specified pulmonary heart diseases: Secondary | ICD-10-CM | POA: Insufficient documentation

## 2010-10-21 LAB — HEMOGLOBIN AND HEMATOCRIT, BLOOD
HCT: 25 — ABNORMAL LOW
Hemoglobin: 8.4 — ABNORMAL LOW

## 2010-10-21 LAB — URINE MICROSCOPIC-ADD ON

## 2010-10-21 LAB — CBC
HCT: 25.4 — ABNORMAL LOW
Hemoglobin: 8.1 — ABNORMAL LOW
Hemoglobin: 8.7 — ABNORMAL LOW
Hemoglobin: 8.8 — ABNORMAL LOW
MCHC: 34.4
MCHC: 34.8
MCV: 86.4
MCV: 88.4
MCV: 88.5
MCV: 89
RBC: 2.62 — ABNORMAL LOW
RBC: 2.88 — ABNORMAL LOW
RBC: 2.94 — ABNORMAL LOW
RBC: 2.97 — ABNORMAL LOW
RBC: 3.53 — ABNORMAL LOW
RDW: 13.6
RDW: 13.6
WBC: 8.6
WBC: 8.9

## 2010-10-21 LAB — BASIC METABOLIC PANEL
CO2: 26
Calcium: 8.2 — ABNORMAL LOW
Chloride: 108
Chloride: 103
Creatinine, Ser: 1.06
Creatinine, Ser: 1.32 — ABNORMAL HIGH
GFR calc Af Amer: >60
GFR calc Af Amer: 49 — ABNORMAL LOW
Glucose, Bld: 227 — ABNORMAL HIGH

## 2010-10-21 LAB — TYPE AND SCREEN
ABO/RH(D): B POS
Antibody Screen: NEGATIVE

## 2010-10-21 LAB — PROTIME-INR
INR: 1.6 — ABNORMAL HIGH
INR: 1.9 — ABNORMAL HIGH
INR: 2.4 — ABNORMAL HIGH
Prothrombin Time: 19.5 — ABNORMAL HIGH
Prothrombin Time: 19.9 — ABNORMAL HIGH
Prothrombin Time: 20.5 — ABNORMAL HIGH

## 2010-10-21 LAB — URINALYSIS, ROUTINE W REFLEX MICROSCOPIC
Bilirubin Urine: NEGATIVE
Hgb urine dipstick: NEGATIVE
Ketones, ur: NEGATIVE
Specific Gravity, Urine: 1.016
pH: 5.5

## 2010-10-21 LAB — HEMOGLOBIN A1C

## 2010-10-21 LAB — ABO/RH: ABO/RH(D): B POS

## 2011-02-07 ENCOUNTER — Inpatient Hospital Stay (HOSPITAL_COMMUNITY)
Admission: EM | Admit: 2011-02-07 | Discharge: 2011-02-10 | DRG: 378 | Disposition: A | Discharge status: Home / Self Care | Payer: Medicare Other | Source: Ambulatory Visit | Attending: Internal Medicine | Admitting: Internal Medicine

## 2011-02-07 ENCOUNTER — Encounter (HOSPITAL_COMMUNITY): Payer: Self-pay | Admitting: *Deleted

## 2011-02-07 DIAGNOSIS — K5731 Diverticulosis of large intestine without perforation or abscess with bleeding: Principal | ICD-10-CM | POA: Diagnosis present

## 2011-02-07 DIAGNOSIS — E7800 Pure hypercholesterolemia, unspecified: Secondary | ICD-10-CM | POA: Diagnosis present

## 2011-02-07 DIAGNOSIS — F3289 Other specified depressive episodes: Secondary | ICD-10-CM | POA: Diagnosis present

## 2011-02-07 DIAGNOSIS — N189 Chronic kidney disease, unspecified: Secondary | ICD-10-CM | POA: Diagnosis present

## 2011-02-07 DIAGNOSIS — D649 Anemia, unspecified: Secondary | ICD-10-CM

## 2011-02-07 DIAGNOSIS — I129 Hypertensive chronic kidney disease with stage 1 through stage 4 chronic kidney disease, or unspecified chronic kidney disease: Secondary | ICD-10-CM | POA: Diagnosis present

## 2011-02-07 DIAGNOSIS — Z96659 Presence of unspecified artificial knee joint: Secondary | ICD-10-CM

## 2011-02-07 DIAGNOSIS — K219 Gastro-esophageal reflux disease without esophagitis: Secondary | ICD-10-CM | POA: Diagnosis present

## 2011-02-07 DIAGNOSIS — E119 Type 2 diabetes mellitus without complications: Secondary | ICD-10-CM | POA: Diagnosis present

## 2011-02-07 DIAGNOSIS — J45909 Unspecified asthma, uncomplicated: Secondary | ICD-10-CM | POA: Diagnosis present

## 2011-02-07 DIAGNOSIS — K922 Gastrointestinal hemorrhage, unspecified: Secondary | ICD-10-CM

## 2011-02-07 DIAGNOSIS — Z79899 Other long term (current) drug therapy: Secondary | ICD-10-CM

## 2011-02-07 DIAGNOSIS — D62 Acute posthemorrhagic anemia: Secondary | ICD-10-CM | POA: Diagnosis present

## 2011-02-07 DIAGNOSIS — K5900 Constipation, unspecified: Secondary | ICD-10-CM | POA: Diagnosis present

## 2011-02-07 DIAGNOSIS — F329 Major depressive disorder, single episode, unspecified: Secondary | ICD-10-CM | POA: Diagnosis present

## 2011-02-07 DIAGNOSIS — G3184 Mild cognitive impairment, so stated: Secondary | ICD-10-CM | POA: Diagnosis present

## 2011-02-07 LAB — CBC
HCT: 25 % — ABNORMAL LOW (ref 36.0–46.0)
Hemoglobin: 8.4 g/dL — ABNORMAL LOW (ref 12.0–15.0)
MCV: 86.5 fL (ref 78.0–100.0)
RDW: 14.7 % (ref 11.5–15.5)
WBC: 7.8 10*3/uL (ref 4.0–10.5)

## 2011-02-07 LAB — COMPREHENSIVE METABOLIC PANEL
BUN: 45 mg/dL — ABNORMAL HIGH (ref 6–23)
CO2: 24 mEq/L (ref 19–32)
Calcium: 8.7 mg/dL (ref 8.4–10.5)
Chloride: 110 mEq/L (ref 96–112)
Creatinine, Ser: 1.81 mg/dL — ABNORMAL HIGH (ref 0.50–1.10)
GFR calc Af Amer: 32 mL/min — ABNORMAL LOW (ref >90)
GFR calc non Af Amer: 27 mL/min — ABNORMAL LOW (ref >90)
Glucose, Bld: 171 mg/dL — ABNORMAL HIGH (ref 70–99)
Total Bilirubin: 0.2 mg/dL — ABNORMAL LOW (ref 0.3–1.2)

## 2011-02-07 LAB — DIFFERENTIAL
Eosinophils Relative: 3 % (ref 0–5)
Lymphocytes Relative: 20 % (ref 12–46)
Monocytes Absolute: 0.4 10*3/uL (ref 0.1–1.0)
Monocytes Relative: 5 % (ref 3–12)
Neutro Abs: 5.7 10*3/uL (ref 1.7–7.7)

## 2011-02-07 LAB — OCCULT BLOOD, POC DEVICE: Fecal Occult Bld: POSITIVE

## 2011-02-07 MED ORDER — SODIUM CHLORIDE 0.9 % IV SOLN: INTRAVENOUS | Status: DC

## 2011-02-07 NOTE — ED Provider Notes (Cosign Needed)
History     CSN: [id number]  Arrival date & time 02/07/11  2043   First MD Initiated Contact with Patient 02/07/11 2216      Chief Complaint  Patient presents with  . Rectal Bleeding    (Consider location/radiation/quality/duration/timing/severity/associated sxs/prior treatment) HPI Comments: Returned home from a funeral, then had an episode of bloody stool.  She reports this as a large amount.  She denies abd or rectal pain.  No fevers or chills.  Colonoscopy "a year or two ago" was okay except for two polyps.    Patient is a 71 y.o. female presenting with hematochezia.  Rectal Bleeding  The current episode started today. The onset was sudden. The problem has been gradually improving. The patient is experiencing no pain. Stool description: dark and red blood mixed. There was no prior successful therapy. There was no prior unsuccessful therapy. Pertinent negatives include no anorexia, no fever, no abdominal pain, no diarrhea and no hemorrhoids.    Past Medical History  Diagnosis Date  . Arthritis   . Lumbar disc disease   . Asthma   . Depression   . Diabetes mellitus   . GERD (gastroesophageal reflux disease)   . Hypercholesterolemia   . HTN (hypertension)   . Peptic ulcer disease   . Valgus foot     right   . Gastric ulcer   . Adenomatous colon polyp   . Diverticulosis   . Internal hemorrhoids   . 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 Anemia   . Umbilical hernia     Past Surgical History  Procedure Date  . Total knee arthroplasty     Bilateral  . Incise and drain abcess     Abdominal Wall  . Umbilical hernia repair   . Lipoma resection     Right Neck  . Colonoscopy 2209 or 2010    Dr. Earlean Shawl, results unknwn, adenoma removed  2001 colonoscopy  . Upper gastrointestinal endoscopy 2001    [place], Grade A esophagitis    History reviewed. No pertinent family history.  History  Substance Use Topics  . Smoking status: Former Smoker -- 2.0 packs/day  for 5 years    Types: Cigarettes    Quit date: 01/10/1958  . Smokeless tobacco: Not on file  . Alcohol Use: No     Quit when she quit smoking    OB History    Grav Para Term Preterm Abortions TAB SAB Ect Mult Living                  Review of Systems  Constitutional: Negative for fever.  Gastrointestinal: Positive for hematochezia. Negative for abdominal pain, diarrhea, anorexia and hemorrhoids.  All other systems reviewed and are negative.    Allergies  Review of patient's allergies indicates no known allergies.  Home Medications   Current Outpatient Rx  Name Route Sig Dispense Refill  . BETAMETHASONE DIPROPIONATE AUG 0.05 % EX CREA Topical Apply 1 application topically 2 (two) times daily. Apply to left leg    . ESOMEPRAZOLE MAGNESIUM 40 MG PO CPDR Oral Take 40 mg by mouth daily before breakfast.     . GALANTAMINE HYDROBROMIDE 8 MG PO TABS Oral Take 8 mg by mouth 2 (two) times daily.    Marland Kitchen GLIPIZIDE ER 10 MG PO TB24 Oral Take 10 mg by mouth 2 (two) times daily.    . INSULIN GLARGINE 100 UNIT/ML [place] SOLN Subcutaneous Inject 35 Units into the skin daily.    Marland Kitchen  ONDANSETRON HCL 4 MG PO TABS Oral Take 4 mg by mouth every 4 (four) hours as needed. For nausea    . POTASSIUM CHLORIDE CRYS ER 20 MEQ PO TBCR Oral Take 20 mEq by mouth daily.    Marland Kitchen SIMVASTATIN 20 MG PO TABS Oral Take 20 mg by mouth every evening.    Marland Kitchen AMLODIPINE BESYLATE 5 MG PO TABS Oral Take 10 mg by mouth daily.     . FUROSEMIDE 40 MG PO TABS Oral Take 80 mg by mouth daily.     Marland Kitchen LORAZEPAM 0.5 MG PO TABS Oral Take 1 mg by mouth at bedtime as needed. For sleep    . METFORMIN HCL ER 500 MG PO TB24 Oral Take 1,000 mg by mouth 2 (two) times daily.       BP 151/61  Pulse 74  Temp(Src) 98.2 F (36.8 C) (Oral)  Resp 20  SpO2 98%  Physical Exam  Nursing note and vitals reviewed. Constitutional: She is oriented to person, place, and time. She appears well-developed and well-nourished. No distress.  HENT:  Head:  Normocephalic and atraumatic.  Neck: Normal range of motion. Neck supple.  Cardiovascular: Normal rate and regular rhythm.  Exam reveals no gallop and no friction rub.   No murmur heard. Pulmonary/Chest: Effort normal and breath sounds normal. No respiratory distress. She has no wheezes.  Abdominal: Soft. Bowel sounds are normal. She exhibits no distension. There is no tenderness.  Genitourinary:       DRE shows maroon-colored stool that is loose.    Musculoskeletal: Normal range of motion.  Neurological: She is alert and oriented to person, place, and time.  Skin: Skin is warm and dry. She is not diaphoretic.    ED Course  Procedures (including critical care time)   Labs Reviewed  CBC  DIFFERENTIAL  COMPREHENSIVE METABOLIC PANEL   No results found.   No diagnosis found.    MDM  The patient presents with hematochezia, and Hb which was low at 8.4.  She has had no further bleeding in the emergency department.  I called GI, Dr. Ardis Hughs from GI who wants the patient admitted to medicine.  I then called Dr. Sharlett Iles who was on call for [facility] who told me he is coming to the ED to see the patient.          Veryl Speak, MD 02/07/11 [phone]

## 2011-02-07 NOTE — ED Notes (Signed)
EDP at bedside  

## 2011-02-07 NOTE — ED Notes (Signed)
Rectal bleeding this afternoon.  With bright and dark red blood.  No previous history

## 2011-02-08 ENCOUNTER — Encounter (HOSPITAL_COMMUNITY): Payer: Self-pay | Admitting: Physician Assistant

## 2011-02-08 DIAGNOSIS — K922 Gastrointestinal hemorrhage, unspecified: Secondary | ICD-10-CM

## 2011-02-08 DIAGNOSIS — D649 Anemia, unspecified: Secondary | ICD-10-CM

## 2011-02-08 LAB — GLUCOSE, CAPILLARY
Glucose-Capillary: 122 mg/dL — ABNORMAL HIGH (ref 70–99)
Glucose-Capillary: 172 mg/dL — ABNORMAL HIGH (ref 70–99)

## 2011-02-08 LAB — CBC
Hemoglobin: 7.9 g/dL — ABNORMAL LOW (ref 12.0–15.0)
MCHC: 33.3 g/dL (ref 30.0–36.0)

## 2011-02-08 MED ORDER — VITAMIN C 500 MG PO TABS
1000.0000 mg | ORAL_TABLET | Freq: Every day | ORAL | Status: DC
  Administered 2011-02-08 – 2011-02-10 (×3): 1000 mg via ORAL
  Filled 2011-02-08 (×4): qty 2

## 2011-02-08 MED ORDER — HYDROCODONE-ACETAMINOPHEN 5-325 MG PO TABS
1.5000 | ORAL_TABLET | ORAL | Status: DC | PRN
  Administered 2011-02-08: 11:00:00 1.5 via ORAL
  Filled 2011-02-08: qty 2

## 2011-02-08 MED ORDER — ACETAMINOPHEN 650 MG RE SUPP: 650.0000 mg | Freq: Four times a day (QID) | RECTAL | Status: DC | PRN

## 2011-02-08 MED ORDER — HYDROCODONE-ACETAMINOPHEN 5-325 MG PO TABS: 1.0000 | ORAL_TABLET | ORAL | Status: DC | PRN

## 2011-02-08 MED ORDER — INSULIN GLARGINE 100 UNIT/ML ~~LOC~~ SOLN: 35.0000 [IU] | Freq: Every day | SUBCUTANEOUS | Status: DC

## 2011-02-08 MED ORDER — ONDANSETRON HCL 4 MG/2ML IJ SOLN: 4.0000 mg | Freq: Four times a day (QID) | INTRAMUSCULAR | Status: DC | PRN

## 2011-02-08 MED ORDER — ONDANSETRON HCL 4 MG PO TABS: 4.0000 mg | ORAL_TABLET | Freq: Four times a day (QID) | ORAL | Status: DC | PRN

## 2011-02-08 MED ORDER — METFORMIN HCL ER 500 MG PO TB24
1000.0000 mg | ORAL_TABLET | Freq: Two times a day (BID) | ORAL | Status: DC
  Administered 2011-02-08 – 2011-02-10 (×5): 1000 mg via ORAL
  Filled 2011-02-08 (×9): qty 2

## 2011-02-08 MED ORDER — VITAMIN E 180 MG (400 UNIT) PO CAPS
400.0000 [IU] | ORAL_CAPSULE | Freq: Every day | ORAL | Status: DC
  Administered 2011-02-08 – 2011-02-10 (×3): 400 [IU] via ORAL
  Filled 2011-02-08 (×4): qty 1

## 2011-02-08 MED ORDER — PANTOPRAZOLE SODIUM 40 MG PO TBEC
40.0000 mg | DELAYED_RELEASE_TABLET | Freq: Every day | ORAL | Status: DC
  Administered 2011-02-08 – 2011-02-10 (×3): 40 mg via ORAL
  Filled 2011-02-08 (×2): qty 1

## 2011-02-08 MED ORDER — LORAZEPAM 0.5 MG PO TABS: 1.0000 mg | ORAL_TABLET | Freq: Every evening | ORAL | Status: DC | PRN

## 2011-02-08 MED ORDER — POLYETHYLENE GLYCOL 3350 17 G PO PACK
17.0000 g | PACK | Freq: Every day | ORAL | Status: DC
  Administered 2011-02-08 – 2011-02-10 (×3): 17 g via ORAL
  Filled 2011-02-08 (×3): qty 1

## 2011-02-08 MED ORDER — SIMVASTATIN 20 MG PO TABS
20.0000 mg | ORAL_TABLET | Freq: Every evening | ORAL | Status: DC
  Administered 2011-02-08 – 2011-02-09 (×2): 20 mg via ORAL
  Filled 2011-02-08 (×4): qty 1

## 2011-02-08 MED ORDER — ONDANSETRON HCL 4 MG PO TABS: 4.0000 mg | ORAL_TABLET | ORAL | Status: DC | PRN

## 2011-02-08 MED ORDER — EXENATIDE 10 MCG/0.04ML ~~LOC~~ SOPN: 10.0000 ug | PEN_INJECTOR | Freq: Two times a day (BID) | SUBCUTANEOUS | Status: DC

## 2011-02-08 MED ORDER — INSULIN GLARGINE 100 UNIT/ML ~~LOC~~ SOLN
35.0000 [IU] | Freq: Every day | SUBCUTANEOUS | Status: DC
  Administered 2011-02-09 – 2011-02-10 (×2): 35 [IU] via SUBCUTANEOUS
  Filled 2011-02-08 (×2): qty 3

## 2011-02-08 MED ORDER — POTASSIUM CHLORIDE 20 MEQ PO PACK: 20.0000 meq | PACK | Freq: Every day | ORAL | Status: DC

## 2011-02-08 MED ORDER — ALUM & MAG HYDROXIDE-SIMETH 200-200-20 MG/5ML PO SUSP
30.0000 mL | Freq: Four times a day (QID) | ORAL | Status: DC | PRN
  Filled 2011-02-08: qty 30

## 2011-02-08 MED ORDER — ALBUTEROL SULFATE (5 MG/ML) 0.5% IN NEBU
2.5000 mg | INHALATION_SOLUTION | RESPIRATORY_TRACT | Status: DC | PRN
  Administered 2011-02-08: 2.5 mg via RESPIRATORY_TRACT
  Filled 2011-02-08: qty 0.5

## 2011-02-08 MED ORDER — POTASSIUM CHLORIDE CRYS ER 20 MEQ PO TBCR
20.0000 meq | EXTENDED_RELEASE_TABLET | Freq: Every day | ORAL | Status: DC
  Administered 2011-02-08 – 2011-02-10 (×3): 20 meq via ORAL
  Filled 2011-02-08 (×4): qty 1

## 2011-02-08 MED ORDER — POTASSIUM CHLORIDE IN NACL 20-0.9 MEQ/L-% IV SOLN
INTRAVENOUS | Status: DC
  Administered 2011-02-08: 03:00:00 via INTRAVENOUS
  Filled 2011-02-08 (×3): qty 1000

## 2011-02-08 MED ORDER — AMLODIPINE BESYLATE 10 MG PO TABS
10.0000 mg | ORAL_TABLET | Freq: Every day | ORAL | Status: DC
  Administered 2011-02-08 – 2011-02-10 (×3): 10 mg via ORAL
  Filled 2011-02-08 (×3): qty 1

## 2011-02-08 MED ORDER — INSULIN ASPART 100 UNIT/ML ~~LOC~~ SOLN
0.0000 [IU] | Freq: Three times a day (TID) | SUBCUTANEOUS | Status: DC
  Administered 2011-02-08 – 2011-02-09 (×2): 3 [IU] via SUBCUTANEOUS
  Filled 2011-02-08 (×2): qty 3

## 2011-02-08 MED ORDER — SIMVASTATIN 40 MG PO TABS: 40.0000 mg | ORAL_TABLET | ORAL | Status: DC

## 2011-02-08 MED ORDER — ZOLPIDEM TARTRATE 5 MG PO TABS: 5.0000 mg | ORAL_TABLET | Freq: Every evening | ORAL | Status: DC | PRN

## 2011-02-08 MED ORDER — ACETAMINOPHEN 325 MG PO TABS: 650.0000 mg | ORAL_TABLET | Freq: Four times a day (QID) | ORAL | Status: DC | PRN

## 2011-02-08 MED ORDER — FOLIC ACID 1 MG PO TABS
1000.0000 ug | ORAL_TABLET | Freq: Every day | ORAL | Status: DC
  Administered 2011-02-08 – 2011-02-10 (×3): 1 mg via ORAL
  Filled 2011-02-08 (×4): qty 1

## 2011-02-08 MED ORDER — AMLODIPINE BESYLATE 10 MG PO TABS
10.0000 mg | ORAL_TABLET | Freq: Every day | ORAL | Status: DC
  Filled 2011-02-08: qty 1

## 2011-02-08 NOTE — ED Notes (Signed)
Pt stated that she has been having rectal bleeding since today. She states that she has not had any abdominal pain or N/V. Bowel sounds present. She stated that she was not having a bowel movement at the time. Pt states that blood was bright red with minimal clots. Will continue to monitor.

## 2011-02-08 NOTE — Consult Note (Signed)
[facility] Consult: 12:09 PM 02/08/2011   Referring Provider: Philip Aspen Primary Care Physician:  Geoffery Lyons, MD, MD Primary Gastroenterologist:  Dr. Carlean Purl .  Previously [facility].  Consult for rectal bleeding.  HPI: [name] is a 71 y.o. female.  Developed painless hematochezia, several episodes, started yesterday afternoon.   latest colonoscopy 2009, recurrent adenoma and diverticulosis.    Hgb has dropped from 8.4 to 7.9.  No transfusions yet.  Says [name] has some epigastric discomfort.   Hx scant BPR in past, nothing recently.   Takes Nexium every day.  gerd not controlled with generic PPI.  Constipation occasionally, manages with prn laxatives.  Past Medical History  Diagnosis Date  . Arthritis   . Lumbar disc disease   . Asthma   . Depression   . Diabetes mellitus   . GERD (gastroesophageal reflux disease)   . Hypercholesterolemia   . HTN (hypertension)   . Peptic ulcer disease   . Valgus foot     right   . Gastric ulcer   . Adenomatous colon polyp   . Diverticulosis   . Internal hemorrhoids   . 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 Anemia   . Umbilical hernia     Past Surgical History  Procedure Date  . Total knee arthroplasty     Bilateral  . Incise and drain abcess     Abdominal Wall  . Umbilical hernia repair   . Lipoma resection     Right Neck  . Colonoscopy 2209 or 2010    oved  2001, 2005, 2009  colonoscopy.  [place]  . Upper gastrointestinal endoscopy 2001    gessner  Grade A esophagitis    Prior to Admission medications   Medication Sig Start Date End Date Taking? Authorizing Provider  augmented betamethasone dipropionate (DIPROLENE-AF) 0.05 % cream Apply 1 application topically 2 (two) times daily. Apply to left leg   Yes Historical Provider, MD  esomeprazole (NEXIUM) 40 MG capsule Take 40 mg by mouth daily before breakfast.    Yes Historical Provider, MD  galantamine (RAZADYNE) 8 MG  tablet Take 8 mg by mouth 2 (two) times daily.   Yes Historical Provider, MD  glipiZIDE (GLUCOTROL XL) 10 MG 24 hr tablet Take 10 mg by mouth 2 (two) times daily.   Yes Historical Provider, MD  insulin glargine (LANTUS) 100 UNIT/ML injection Inject 35 Units into the skin daily.   Yes Historical Provider, MD  ondansetron (ZOFRAN) 4 MG tablet Take 4 mg by mouth every 4 (four) hours as needed. For nausea   Yes Historical Provider, MD  potassium chloride SA (K-DUR,KLOR-CON) 20 MEQ tablet Take 20 mEq by mouth daily.   Yes Historical Provider, MD  simvastatin (ZOCOR) 20 MG tablet Take 20 mg by mouth every evening.   Yes Historical Provider, MD  amLODipine (NORVASC) 5 MG tablet Take 10 mg by mouth daily.     Historical Provider, MD  furosemide (LASIX) 40 MG tablet Take 80 mg by mouth daily.     Historical Provider, MD  LORazepam (ATIVAN) 0.5 MG tablet Take 1 mg by mouth at bedtime as needed. For sleep    Historical Provider, MD  metFORMIN (GLUCOPHAGE-XR) 500 MG 24 hr tablet Take 1,000 mg by mouth 2 (two) times daily.     Historical Provider, MD    Scheduled Meds:    . amLODipine  10 mg Oral Daily  . folic acid  [id number] mcg Oral Daily  . insulin aspart  0-15 Units Subcutaneous TID WC  . insulin glargine  35 Units Subcutaneous Daily  . metFORMIN  1,000 mg Oral BID WC  . pantoprazole  40 mg Oral Daily  . polyethylene glycol  17 g Oral Daily  . potassium chloride SA  20 mEq Oral Daily  . simvastatin  20 mg Oral QPM  . vitamin C  1,000 mg Oral Daily  . vitamin E  400 Units Oral Daily  . DISCONTD: amLODipine  10 mg Oral Daily  . DISCONTD: exenatide  10 mcg Subcutaneous BID WC  . DISCONTD: insulin glargine  35 Units Subcutaneous QHS  . DISCONTD: potassium chloride  20 mEq Oral Daily  . DISCONTD: simvastatin  40 mg Oral 1 day or 1 dose   Infusions:    . 0.9 % NaCl with KCl 20 mEq / L 75 mL/hr at 02/08/11 0243  . DISCONTD: sodium chloride     PRN Meds: acetaminophen, acetaminophen, albuterol,  alum & mag hydroxide-simeth, HYDROcodone-acetaminophen, LORazepam, ondansetron (ZOFRAN) IV, ondansetron, zolpidem, DISCONTD: HYDROcodone-acetaminophen, DISCONTD: ondansetron   Allergies as of 02/07/2011  . (No Known Allergies)    History reviewed. No pertinent family history.  History   Social History  . Marital Status: Single    Spouse Name: N/A    Number of Children: 0  . Years of Education: N/A   Occupational History  . Retired     Astronomer   Social History Main Topics  . Smoking status: Former Smoker -- 2.0 packs/day for 5 years    Types: Cigarettes    Quit date: 01/10/1958  . Smokeless tobacco: Not on file  . Alcohol Use: No     Quit when [name] quit smoking  . Drug Use: No  . Sexually Active: Not on file   Other Topics Concern  . 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  Chronic SOB, DOE CV:  No palpitations or chest pain GU:  Incontinent.  No hematuria GI:  As above Heme:  No hx excessive bleeding or bruising.    Transfusions:  None ever Neuro:  No headache.  No seizure Derm:  No rash or itching Endocrine:  No excessive thirst.  No night sweats Immunization:  Up to date flu shot  Travel:  none   PHYSICAL EXAM: Vital signs in last 24 hours: Temp:  [98.2 F (36.8 C)-98.6 F (37 C)] 98.4 F (36.9 C) (01/29 0812) Pulse Rate:  [64-98] 72  (01/29 0812) Resp:  [16-20] 16  (01/29 0812) BP: (151-182)/(53-85) 160/62 mmHg (01/29 0812) SpO2:  [98 %-100 %] 100 % (01/29 0812) Weight:  [220 lb 10.9 oz (100.1 kg)] 220 lb 10.9 oz (100.1 kg) (01/29 0135)  Last BM Date: 02/08/11  General: obese.  Looks unwell Head:  Atraumatic  Eyes:  assymetric , left eyelid bulges  ? Lipoma? Ears:  A bit HOH  Nose:  No discharge Mouth:  Dental bridges bothe jaws. Neck:  No mass.  obese Lungs:  Clear.  No cough Heart:  RR.  noMRG Abdomen:  Soft, obese, NT, ND.   Rectal: not done.     Musc/Skeltl: extremely flat feet.  Extremities:  No ankle edema  Neurologic:  Oriented x 3.  No tremor. Skin:  No rash, no sores Tattoos:  none Nodes:  None at neck   Psych:  Pleasant.  Not  anxious.   LAB RESULTS:  Basename 02/08/11 0522 02/07/11 2220  WBC 7.2 7.8  HGB 7.9* 8.4*  HCT 23.7* 25.0*  PLT 220 235   BMET Lab Results  Component Value Date   NA 142 02/07/2011   NA 137 07/18/2009   NA 137 07/17/2009   K 4.5 02/07/2011   K 4.4 07/18/2009   K 3.9 07/17/2009   CL 110 02/07/2011   CL 109 07/18/2009   CL 107 07/17/2009   CO2 24 02/07/2011   CO2 24 07/18/2009   CO2 23 07/17/2009   GLUCOSE 171* 02/07/2011   GLUCOSE 186* 07/18/2009   GLUCOSE 223* 07/17/2009   BUN 45* 02/07/2011   BUN 33* 07/18/2009   BUN 46* 07/17/2009   CREATININE 1.81* 02/07/2011   CREATININE 1.23* 07/18/2009   CREATININE 1.41* 07/17/2009   CALCIUM 8.7 02/07/2011   CALCIUM 8.0* 07/18/2009   CALCIUM 8.7 07/17/2009   LFT Lab Results  Component Value Date   PROT 6.3 02/07/2011   ALBUMIN 3.1* 02/07/2011   AST 14 02/07/2011   ALT 16 02/07/2011   ALKPHOS 76 02/07/2011   BILITOT 0.2* 02/07/2011   PT/INR Lab Results  Component Value Date   INR 1.04 07/14/2009   INR 1.13 06/16/2009   INR 1.9* 10/19/2006   ENDOSCOPIC STUDIES: 07/2007 colonoscopy  Diverticulosis and 55mm adenoma.  09/2003   Colonoscopy   To cecum.  Internal hemorrhoids, no recurrent polyps 11/1999   Colonoscopy    Adenomatous polyp removed.   07/1999  EGD   Esophagitis, GERD.  Due for follow up colon in Nov 2014.  IMPRESSION: 1.  Lower gi bleed, likely diverticular.  2.  Abl anemia.  Chronic anemia. 3.  IDDM 4.  Chronic kidney disease     PLAN: 1.  Follow cbc.  Transfuse prn.  Decision re colonoscopy to be made by dr Ardis Hughs.   LOS: 1 day   Azucena Freed  02/08/2011, 12:09 PM Pager: [phone]   ________________________________________________________________________  [facility] MD note:  I  personally examined the patient, reviewed the data and agree with the assessment and plan described above.  [name] says that the bleeding is definitely slowing down, but [name] had some red blood per rectum just a few hours ago again.  No abd pain.  Very likely diverticular and if [name] slows, stops completely then no need to repeat colonsocopy at this point.  I am ordering one unit blood transfusion.   Owens Loffler, MD [facility] Pager [phone]

## 2011-02-08 NOTE — ED Notes (Signed)
Called [phone]. RN unavailable. Will continue to monitor.

## 2011-02-08 NOTE — Progress Notes (Signed)
Paged Dr Philip Aspen for clarification of bed request. states that pt needs a Stepdown bed, will put in bed request. Also notified MD of pt blood pressure, instructed to give AM dose of Norvasc now

## 2011-02-08 NOTE — Progress Notes (Signed)
Late entry: Pt transferred from ED, admitted to Rm 6734. Pt is alert and oriented, comes from home alone. Ambulatory with cane and standby assist. States she has fallen at home recently, high fall risk. No skin breakdown noted. Instructed pt to call for assistance before getting out of bed. Mild dyspnea with activity but oxygen sats stable on RA, neb treatment given. MD notified of elevated BP (orders given). No active rectal bleeding noted, just streaks on toilet tissue when she wipes. No complaints of pain at this time, will continue monitoring   Pt transferred to Rm/6525 (StepDown) around 0350 per MD bed request, report called to RN on 6500

## 2011-02-08 NOTE — ED Notes (Signed)
EDP at bedside  

## 2011-02-08 NOTE — ED Notes (Signed)
Admitting MD at bedside.

## 2011-02-08 NOTE — H&P (Signed)
[name] is an 71 y.o. female.   Chief Complaint: rectal bleeding HPI:  The patient is a 71 year old woman with several medical problems who was in her usual state of good health until yesterday afternoon while she was at a funeral.  She went to the bathroom and had a small amount of bright red blood per rectum.  Later that afternoon she went home and had a large bloody bowel movement with fecal incontinence.  She had 2 more moderate-sized bloody bowel movements.  This was associated with some fatigue but no worsening of chronic shortness of breath, associated presented to the emergency room for evaluation.  She denied having symptoms of nausea, vomiting, abdominal pain, or rectal pain.  She last had a colonoscopy in about 2009 that showed some polyps.  She has no history of peptic ulcer disease.  Past Medical History  Diagnosis Date  . Arthritis   . Lumbar disc disease   . Asthma   . Depression   . Diabetes mellitus   . GERD (gastroesophageal reflux disease)   . Hypercholesterolemia   . HTN (hypertension)   . Peptic ulcer disease   . Valgus foot     right   . Gastric ulcer   . Adenomatous colon polyp   . Diverticulosis   . Internal hemorrhoids   . 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 Anemia   . Umbilical hernia     Medications Prior to Admission  Medication Dose Route Frequency Provider Last Rate Last Dose  . 0.9 % NaCl with KCl 20 mEq/ L  infusion   Intravenous Continuous Donnajean Lopes, MD      . acetaminophen (TYLENOL) tablet 650 mg  650 mg Oral Q6H PRN Donnajean Lopes, MD       Or  . acetaminophen (TYLENOL) suppository 650 mg  650 mg Rectal Q6H PRN Donnajean Lopes, MD      . albuterol (PROVENTIL) (5 MG/ML) 0.5% nebulizer solution 2.5 mg  2.5 mg Nebulization Q4H PRN Donnajean Lopes, MD      . alum & mag hydroxide-simeth (MAALOX/MYLANTA) 200-200-20 MG/5ML suspension 30 mL  30 mL Oral Q6H PRN Donnajean Lopes, MD      . amLODipine (NORVASC) tablet 10 mg   10 mg Oral Daily Donnajean Lopes, MD      . exenatide (BYETTA) injection SOLN 10 mcg  10 mcg Subcutaneous BID WC Donnajean Lopes, MD      . folic acid (FOLVITE) tablet 1 mg  1,000 mcg Oral Daily Donnajean Lopes, MD      . HYDROcodone-acetaminophen [facility]) 5-325 MG per tablet 1.5 tablet  1.5 tablet Oral Q4H PRN Donnajean Lopes, MD      . insulin aspart (novoLOG) injection 0-15 Units  0-15 Units Subcutaneous TID WC Donnajean Lopes, MD      . insulin glargine (LANTUS) injection 35 Units  35 Units Subcutaneous Daily Donnajean Lopes, MD      . LORazepam (ATIVAN) tablet 1 mg  1 mg Oral QHS PRN Donnajean Lopes, MD      . metFORMIN (GLUCOPHAGE-XR) 24 hr tablet 1,000 mg  1,000 mg Oral BID WC Donnajean Lopes, MD      . ondansetron [facility]) tablet 4 mg  4 mg Oral Q6H PRN Donnajean Lopes, MD       Or  . ondansetron [facility]) injection 4 mg  4 mg Intravenous Q6H PRN Donnajean Lopes, MD      .  pantoprazole (PROTONIX) EC tablet 40 mg  40 mg Oral Daily Donnajean Lopes, MD      . polyethylene glycol (MIRALAX / GLYCOLAX) packet 17 g  17 g Oral Daily Donnajean Lopes, MD      . potassium chloride SA (K-DUR,KLOR-CON) CR tablet 20 mEq  20 mEq Oral Daily Donnajean Lopes, MD      . simvastatin (ZOCOR) tablet 20 mg  20 mg Oral QPM Donnajean Lopes, MD      . vitamin C (ASCORBIC ACID) tablet 1,000 mg  1,000 mg Oral Daily Donnajean Lopes, MD      . vitamin E capsule 400 Units  400 Units Oral Daily Donnajean Lopes, MD      . zolpidem (AMBIEN) tablet 5 mg  5 mg Oral QHS PRN Donnajean Lopes, MD      . DISCONTD: 0.9 %  sodium chloride infusion   Intravenous Continuous Veryl Speak, MD      . DISCONTD: HYDROcodone-acetaminophen (NORCO) 5-325 MG per tablet 1-2 tablet  1-2 tablet Oral Q4H PRN Donnajean Lopes, MD      . DISCONTD: insulin glargine (LANTUS) injection 35 Units  35 Units Subcutaneous QHS Donnajean Lopes, MD      . DISCONTD: ondansetron [facility]) tablet 4 mg  4 mg Oral Q4H PRN  Donnajean Lopes, MD      . DISCONTD: potassium chloride (KLOR-CON) packet 20 mEq  20 mEq Oral Daily Donnajean Lopes, MD      . DISCONTD: simvastatin (ZOCOR) tablet 40 mg  40 mg Oral 1 day or 1 dose Donnajean Lopes, MD       Medications Prior to Admission  Medication Sig Dispense Refill  . esomeprazole (NEXIUM) 40 MG capsule Take 40 mg by mouth daily before breakfast.       . amLODipine (NORVASC) 5 MG tablet Take 10 mg by mouth daily.       . furosemide (LASIX) 40 MG tablet Take 80 mg by mouth daily.       Marland Kitchen LORazepam (ATIVAN) 0.5 MG tablet Take 1 mg by mouth at bedtime as needed. For sleep      . metFORMIN (GLUCOPHAGE-XR) 500 MG 24 hr tablet Take 1,000 mg by mouth 2 (two) times daily.         ADDITIONAL HOME MEDICATIONS: See medication list  PHYSICIANS INVOLVED IN CARE: Burnard Bunting (primary care), Silvano Rusk (GI)  Past Surgical History  Procedure Date  . Total knee arthroplasty     Bilateral  . Incise and drain abcess     Abdominal Wall  . Umbilical hernia repair   . Lipoma resection     Right Neck  . Colonoscopy 2209 or 2010    Dr. Earlean Shawl, results unknwn, adenoma removed  2001 colonoscopy  . Upper gastrointestinal endoscopy 2001    [place], Grade A esophagitis    History reviewed. No pertinent family history.   Social History:  reports that she quit smoking about 53 years ago. Her smoking use included Cigarettes. She has a 10 pack-year smoking history. She does not have any smokeless tobacco history on file. She reports that she does not drink alcohol or use illicit drugs.  Allergies: No Known Allergies   ROS: anemia, ankle swelling, arthritis, asthma, cancer/tumor, diabetes, heart murmur and high blood pressure  PHYSICAL EXAM: Blood pressure 182/85, pulse 74, temperature 98.6 F (37 C), temperature source Oral, resp. rate 18, SpO2 98.00%. In general, she is a mildly overweight  black woman who was in no apparent distress while lying partially upright in bed.   HEENT exam was within normal limits, neck supple without jugular venous distention or carotid bruit, chest had bilateral scattered wheezing without the use of secondary muscles of respiration, heart had a regular rate and rhythm with a systolic ejection murmur of grade 2/6 at the left sternal border, abdomen had normal bowel sounds and no tenderness, rectal exam by emergency room physician showed maroon-colored stool and no melena, extremities had bilateral 2+ pitting edema of the legs with hyperpigmentation consistent with chronic venous insufficiency.  She has a double upright brace on the right lower extremity.  Neurologic exam: The patient is alert and well oriented with normal affect.  She is able to answer questions appropriately.  She can move all extremities well.  Results for orders placed during the hospital encounter of 02/07/11 (from the past 48 hour(s))  CBC     Status: Abnormal   Collection Time   02/07/11 10:20 PM      Component Value Range Comment   WBC 7.8  4.0 - 10.5 (K/uL)    RBC 2.89 (*) 3.87 - 5.11 (MIL/uL)    Hemoglobin 8.4 (*) 12.0 - 15.0 (g/dL)    HCT 25.0 (*) 36.0 - 46.0 (%)    MCV 86.5  78.0 - 100.0 (fL)    MCH 29.1  26.0 - 34.0 (pg)    MCHC 33.6  30.0 - 36.0 (g/dL)    RDW 14.7  11.5 - 15.5 (%)    Platelets 235  150 - 400 (K/uL)   DIFFERENTIAL     Status: Normal   Collection Time   02/07/11 10:20 PM      Component Value Range Comment   Neutrophils Relative 73  43 - 77 (%)    Neutro Abs 5.7  1.7 - 7.7 (K/uL)    Lymphocytes Relative 20  12 - 46 (%)    Lymphs Abs 1.5  0.7 - 4.0 (K/uL)    Monocytes Relative 5  3 - 12 (%)    Monocytes Absolute 0.4  0.1 - 1.0 (K/uL)    Eosinophils Relative 3  0 - 5 (%)    Eosinophils Absolute 0.2  0.0 - 0.7 (K/uL)    Basophils Relative 0  0 - 1 (%)    Basophils Absolute 0.0  0.0 - 0.1 (K/uL)   COMPREHENSIVE METABOLIC PANEL     Status: Abnormal   Collection Time   02/07/11 10:20 PM      Component Value Range Comment   Sodium 142  135  - 145 (mEq/L)    Potassium 4.5  3.5 - 5.1 (mEq/L)    Chloride 110  96 - 112 (mEq/L)    CO2 24  19 - 32 (mEq/L)    Glucose, Bld 171 (*) 70 - 99 (mg/dL)    BUN 45 (*) 6 - 23 (mg/dL)    Creatinine, Ser 1.81 (*) 0.50 - 1.10 (mg/dL)    Calcium 8.7  8.4 - 10.5 (mg/dL)    Total Protein 6.3  6.0 - 8.3 (g/dL)    Albumin 3.1 (*) 3.5 - 5.2 (g/dL)    AST 14  0 - 37 (U/L)    ALT 16  0 - 35 (U/L)    Alkaline Phosphatase 76  39 - 117 (U/L)    Total Bilirubin 0.2 (*) 0.3 - 1.2 (mg/dL)    GFR calc non Af Amer 27 (*) >90 (mL/min)    GFR calc Af Wyvonnia Lora  32 (*) >90 (mL/min)   OCCULT BLOOD, POC DEVICE     Status: Normal   Collection Time   02/07/11 10:27 PM      Component Value Range Comment   Fecal Occult Bld POSITIVE     GLUCOSE, CAPILLARY     Status: Abnormal   Collection Time   02/08/11  1:31 AM      Component Value Range Comment   Glucose-Capillary 122 (*) 70 - 99 (mg/dL)    No results found.   Assessment/Plan #1 rectal bleed: this most likely secondary to diverticular hemorrhage given the volume of bleeding in the significant drop in hemoglobin level.  Alternatively, her bleeding could be from ischemic colitis.  It would be unlikely that her family would be from a neoplasm in the colon given that she has regular colonoscopies.  We will request a GI physician evaluation later today.We will continue IV fluids and also high dose proton pump inhibitor treatment for the unlikely possibility of an upper GI source of lower GI bleeding. #2 asthma: She has mild scattered wheezing so albuterol nebulizers will be added to her regimen. #3 diabetes mellitus, type II: Stable #4 hypertension: Blood pressure is somewhat elevated today in the emergency room.  If her blood pressure remains elevated we will adjust medications.   Helayne Metsker G 02/08/2011, 2:03 AM

## 2011-02-09 LAB — CBC
HCT: 27.6 % — ABNORMAL LOW (ref 36.0–46.0)
MCH: 29 pg (ref 26.0–34.0)
MCHC: 33.3 g/dL (ref 30.0–36.0)
MCV: 87.1 fL (ref 78.0–100.0)
RDW: 14.7 % (ref 11.5–15.5)

## 2011-02-09 LAB — GLUCOSE, CAPILLARY
Glucose-Capillary: 163 mg/dL — ABNORMAL HIGH (ref 70–99)
Glucose-Capillary: 94 mg/dL (ref 70–99)
Glucose-Capillary: 107 mg/dL — ABNORMAL HIGH (ref 70–99)

## 2011-02-09 LAB — TYPE AND SCREEN
Antibody Screen: NEGATIVE
Unit division: 0

## 2011-02-09 MED ORDER — ONDANSETRON HCL 4 MG PO TABS
4.0000 mg | ORAL_TABLET | Freq: Four times a day (QID) | ORAL | Status: DC | PRN
  Administered 2011-02-09 – 2011-02-10 (×2): 4 mg via ORAL
  Filled 2011-02-09 (×3): qty 1

## 2011-02-09 NOTE — Progress Notes (Signed)
Pt. Vomited mod. Amt. Clear fluid.  States was upset because her medications were generic and she was used to receiving name brand medications.   MD notified for antiemetic.

## 2011-02-09 NOTE — Progress Notes (Signed)
Subjective: [name] is doing well. CC she is sitting up in a chair diet has been advanced. Does have a little bit of dark stool but no further bright red blood. She has no abdominal pain nausea vomiting. She is no chest pain or shortness of breath. GIs recommendations are noted.  Objective: Vital signs in last 24 hours: Temp:  [97.7 F (36.5 C)-98.7 F (37.1 C)] 98.2 F (36.8 C) (01/30 0447) Pulse Rate:  [54-73] 55  (01/30 0447) Resp:  [16-27] 20  (01/30 0447) BP: (128-175)/(55-81) 166/75 mmHg (01/30 0447) SpO2:  [94 %-100 %] 99 % (01/30 0447) Weight change:   CBG (last 3)   Basename 02/08/11 2203 02/08/11 1737 02/08/11 1230  GLUCAP 88 164* 131*    Intake/Output from previous day: 01/29 0701 - 01/30 0700 In: 1015 [I.V.:675; Blood:340] Out: 4 [Urine:3; Stool:1]  Physical Exam: Patient is awake alert no distress. She is no JVD or bruits. Lungs are clear with diminished breath sounds at the bases. Cardiovascular exam distant heart sounds with a 2/6 systolic ejection murmur left sternal border. Abdomen is protuberant soft nondistended bowel sounds normal nontender. Extremities with 1+ edema stasis changes neurologically she is nonlateralizing higher cortical functioning is intact.   Lab Results:  [facility] 02/07/11 2220  NA 142  K 4.5  CL 110  CO2 24  GLUCOSE 171*  BUN 45*  CREATININE 1.81*  CALCIUM 8.7  MG --  PHOS --    Basename 02/07/11 2220  AST 14  ALT 16  ALKPHOS 76  BILITOT 0.2*  PROT 6.3  ALBUMIN 3.1*    Basename 02/09/11 0515 02/08/11 0522 02/07/11 2220  WBC 8.0 7.2 --  NEUTROABS -- -- 5.7  HGB 9.2* 7.9* --  HCT 27.6* 23.7* --  MCV 87.1 86.8 --  PLT 213 220 --   Lab Results  Component Value Date   INR 1.04 07/14/2009   INR 1.13 06/16/2009   INR 1.9* 10/19/2006   No results found for this basename: CKTOTAL:3,CKMB:3,CKMBINDEX:3,TROPONINI:3 in the last 72 hours No results found for this basename: TSH,T4TOTAL,FREET3,T3FREE,THYROIDAB in the last 72  hours No results found for this basename: VITAMINB12:2,FOLATE:2,FERRITIN:2,TIBC:2,IRON:2,RETICCTPCT:2 in the last 72 hours  Studies/Results: No results found.   Assessment/Plan: #1 lower GI bleed likely diverticular settling down has had significant endoscopic evaluations in the past we'll continue advanced the diet watch an additional day.  #2 diabetes mellitus type 2 stable  #3 asthma stable  #4 essential hypertension stable  Plan to the floor mobilize advance diet follow CBC   LOS: 2 days   Naitik Hermann A 02/09/2011, 7:37 AM

## 2011-02-09 NOTE — Progress Notes (Signed)
Subjective:  One unit blood transfusion yesterday with good bump in Hb.  Getting a bath this am.  Ate dinner well.  No n/vomiting.  Had a BM last night "was dark."  The BRB has stopped.  Has minor ache in epigastrium, otherwise feels fine.  Objective: Vital signs in last 24 hours: Temp:  [97.7 F (36.5 C)-98.7 F (37.1 C)] 98.2 F (36.8 C) (01/30 0447) Pulse Rate:  [54-73] 55  (01/30 0447) Resp:  [16-27] 20  (01/30 0447) BP: (128-175)/(55-81) 166/75 mmHg (01/30 0447) SpO2:  [94 %-100 %] 99 % (01/30 0447) Last BM Date: 02/08/11 General: alert and oriented times 3 Heart: regular rate and rythm Abdomen: soft, non-tender, non-distended, normal bowel sounds   Intake/Output from previous day: 01/29 0701 - 01/30 0700 In: 1015 [I.V.:675; Blood:340] Out: 4 [Urine:3; Stool:1] Intake/Output this shift: Total I/O In: 940 [I.V.:600; Blood:340] Out: 2 [Urine:2]   Lab Results:  Basename 02/09/11 0515 02/08/11 0522 02/07/11 2220  WBC 8.0 7.2 7.8  HGB 9.2* 7.9* 8.4*  PLT 213 220 235  MCV 87.1 86.8 86.5   BMET  Basename 02/07/11 2220  NA 142  K 4.5  CL 110  CO2 24  GLUCOSE 171*  BUN 45*  CREATININE 1.81*  CALCIUM 8.7   LFT  Basename 02/07/11 2220  PROT 6.3  ALBUMIN 3.1*  AST 14  ALT 16  ALKPHOS 76  BILITOT 0.2*  BILIDIR --  IBILI --     Assessment/Plan: 71 y.o. female with GI bleeding, likely lower  Good bump in Hb, she's been HD stable, no recurrent bright red bleeding.  The bleeding has probably stopped.  Would observe another 24 hours and if still doing well, she is safe to d/c tomorrow.  I will HL iv for now.  CBC in AM.   Owens Loffler, MD  02/09/2011, 6:59 AM [facility] Pager [phone]

## 2011-02-10 LAB — GLUCOSE, CAPILLARY
Glucose-Capillary: 79 mg/dL (ref 70–99)
Glucose-Capillary: 137 mg/dL — ABNORMAL HIGH (ref 70–99)

## 2011-02-10 LAB — CBC
Hemoglobin: 8.8 g/dL — ABNORMAL LOW (ref 12.0–15.0)
RBC: 2.99 MIL/uL — ABNORMAL LOW (ref 3.87–5.11)

## 2011-02-10 NOTE — Progress Notes (Signed)
Subjective:  Vomited once yesterday, zofran ordered PRN.  ATe chicken, broccoli for dinner with mild nausea but no vomiting. Says she "always gets nauseas with generic meds."  No overt GI bleeding.  No abd pains.  Objective: Vital signs in last 24 hours: Temp:  [97.7 F (36.5 C)-98.7 F (37.1 C)] 98.7 F (37.1 C) (01/31 0419) Pulse Rate:  [63-77] 72  (01/31 0419) Resp:  [20-30] 20  (01/31 0419) BP: (109-188)/(55-68) 169/65 mmHg (01/31 0419) SpO2:  [95 %-100 %] 96 % (01/31 0419) Last BM Date: 02/09/11 General: alert and oriented times 3 Heart: regular rate and rythm Abdomen: soft, non-tender, non-distended, normal bowel sounds    Lab Results:  Basename 02/10/11 0455 02/09/11 0515 02/08/11 0522  WBC 7.8 8.0 7.2  HGB 8.8* 9.2* 7.9*  PLT 201 213 220  MCV 88.0 87.1 86.8   BMET  Basename 02/07/11 2220  NA 142  K 4.5  CL 110  CO2 24  GLUCOSE 171*  BUN 45*  CREATININE 1.81*  CALCIUM 8.7   LFT  Basename 02/07/11 2220  PROT 6.3  ALBUMIN 3.1*  AST 14  ALT 16  ALKPHOS 47  BILITOT 0.2*  BILIDIR --  IBILI --     Assessment/Plan: 71 y.o. female with resolved gi bleeding  This was likely lower, likely diverticular and seems to have stopped.  Hb stable, hemodynamically stable, no overt gi bleeding.  I think she is safe to d/c home today.  Since bleeding has stopped I don't think she needs repeat colonoscopy at this point, will be due for polyp surveillance colonoscopy 07/2012 with Dr. Carlean Purl.    Owens Loffler, MD  02/10/2011, 7:28 AM [facility] Pager [phone]

## 2011-02-10 NOTE — Discharge Summary (Signed)
DISCHARGE SUMMARY  [name]  MR#: [id number]  DOB:December 03, 1940  Date of Admission: 02/07/2011 Date of Discharge: 02/10/2011  Attending Physician:Gaynell Eggleton A  Patient's HW:[id number] A, MD, MD  Consults:Treatment Team:  Owens Loffler, MD  Discharge Diagnoses: Active Problems:  GI bleed- likely secondary to diverticular bleed self-limiting  Anemia- due to chronic disease as well as blood loss  Known diverticulosis coli by colonoscopy 2009  Gastroesophageal reflux disease on chronic proton pump inhibitors  Diabetes mellitus type 2 oral agents  Essential hypertension  Hyperlipidemia  Chronic constipation  Mild cognitive impairment   Discharge Medications: Medication List  As of 02/10/2011  7:34 AM   STOP taking these medications         aspirin 81 MG tablet      BYETTA 10 MCG PEN 10 MCG/0.04ML Soln      donepezil 5 MG tablet      FEOSOL PO      folic acid [id number] MCG tablet      glipiZIDE 10 MG tablet      HYDROcodone-acetaminophen 7.5-750 MG per tablet      methocarbamol 500 MG tablet      polyethylene glycol packet      potassium chloride 20 MEQ packet      simvastatin 40 MG tablet      vitamin C 1000 MG tablet      vitamin E 400 UNIT capsule         TAKE these medications         amLODipine 5 MG tablet   Commonly known as: NORVASC   Take 10 mg by mouth daily.      augmented betamethasone dipropionate 0.05 % cream   Commonly known as: DIPROLENE-AF   Apply 1 application topically 2 (two) times daily. Apply to left leg      esomeprazole 40 MG capsule   Commonly known as: NEXIUM   Take 40 mg by mouth daily before breakfast.      furosemide 40 MG tablet   Commonly known as: LASIX   Take 80 mg by mouth daily.      galantamine 8 MG tablet   Commonly known as: RAZADYNE   Take 8 mg by mouth 2 (two) times daily.      glipiZIDE 10 MG 24 hr tablet   Commonly known as: GLUCOTROL XL   Take 10 mg by mouth 2 (two) times daily.      insulin  glargine 100 UNIT/ML injection   Commonly known as: LANTUS   Inject 35 Units into the skin daily.      LORazepam 0.5 MG tablet   Commonly known as: ATIVAN   Take 1 mg by mouth at bedtime as needed. For sleep      metFORMIN 500 MG 24 hr tablet   Commonly known as: GLUCOPHAGE-XR   Take 1,000 mg by mouth 2 (two) times daily.      ondansetron 4 MG tablet   Commonly known as: ZOFRAN   Take 4 mg by mouth every 4 (four) hours as needed. For nausea      potassium chloride SA 20 MEQ tablet   Commonly known as: K-DUR,KLOR-CON   Take 20 mEq by mouth daily.      simvastatin 20 MG tablet   Commonly known as: ZOCOR   Take 20 mg by mouth every evening.            Hospital Procedures: No results found.  History of Present Illness: The patient is a 71 year old woman  with several medical problems who was in her usual state of good health until yesterday afternoon while she was at a funeral. She went to the bathroom and had a small amount of bright red blood per rectum. Later that afternoon she went home and had a large bloody bowel movement with fecal incontinence. She had 2 more moderate-sized bloody bowel movements. This was associated with some fatigue but no worsening of chronic shortness of breath, associated presented to the emergency room for evaluation. She denied having symptoms of nausea, vomiting, abdominal pain, or rectal pain. She last had a colonoscopy in about 2009 that showed some polyps. She has no history of peptic ulcer disease.   Hospital Course: Patient was admitted to the step down unit for serial monitoring of H. and H., hydration and bowel rest. Bright red blood (to immediately with only one episode in the emergency room and nonsense admission. She did have one unit packed red blood cells transfused and blood stabilized quickly thereafter on proton pump inhibitors and hydration. Gastroenterology was consulted it was felt in keeping with our initial assessment this is likely  diverticular bleed, her anatomy was well known no further studies were indicated. For 36 hours she had no further bleeding and was on a regular diet. She no dominant pain. Did have some transient nausea and this was self-limiting. She remained stable hemodynamically with stable CBC thereafter. Blood sugars work separable. Pulmonary status was baseline. She is discharged for further as an outpatient but  Day of Discharge Exam BP 169/65  Pulse 72  Temp(Src) 98.7 F (37.1 C) (Oral)  Resp 20  Ht 5\' 5"  (1.651 m)  Wt 100.1 kg (220 lb 10.9 oz)  BMI 36.72 kg/m2  SpO2 96%  Physical Exam: General appearance: alert, cooperative, appears stated age and no distress Eyes: no scleral icterus Throat: oropharynx moist without erythema Resp: clear to auscultation bilaterally Cardio: regular rate and rhythm, S1, S2 normal, no murmur, click, rub or gallop Extremities: no clubbing, cyanosis or edema  Discharge Labs:  [facility] 02/07/11 2220  NA 142  K 4.5  CL 110  CO2 24  GLUCOSE 171*  BUN 45*  CREATININE 1.81*  CALCIUM 8.7  MG --  PHOS --    Basename 02/07/11 2220  AST 14  ALT 16  ALKPHOS 76  BILITOT 0.2*  PROT 6.3  ALBUMIN 3.1*    Basename 02/10/11 0455 02/09/11 0515 02/07/11 2220  WBC 7.8 8.0 --  NEUTROABS -- -- 5.7  HGB 8.8* 9.2* --  HCT 26.3* 27.6* --  MCV 88.0 87.1 --  PLT 201 213 --   No results found for this basename: CKTOTAL:3,CKMB:3,CKMBINDEX:3,TROPONINI:3 in the last 72 hours No results found for this basename: TSH,T4TOTAL,FREET3,T3FREE,THYROIDAB in the last 72 hours No results found for this basename: VITAMINB12:2,FOLATE:2,FERRITIN:2,TIBC:2,IRON:2,RETICCTPCT:2 in the last 72 hours  Discharge instructions: Discharge Orders    Future Orders Please Complete By Expires   Diet - low sodium heart healthy      Increase activity slowly         Disposition: Home  Follow-up Appts: Follow-up with Dr. Reynaldo Minium at [facility] in 1 week.  Call for  appointment.  Condition on Discharge: Stable  Tests Needing Follow-up: Recheck CBC and chemistries upon presentation back to the office. He is also been informed by myself as always to monitor blood sugars closely to be certain he stabilized in the outpatient setting. She is well aware of how to contact myself my nurse Beverlee Nims for any issues and  we will check on her within 24 hours by phone.  Signed: Aarthi Uyeno A 02/10/2011, 7:34 AM

## 2011-02-23 ENCOUNTER — Other Ambulatory Visit: Payer: Self-pay | Admitting: Internal Medicine

## 2011-02-23 DIAGNOSIS — Z1231 Encounter for screening mammogram for malignant neoplasm of breast: Secondary | ICD-10-CM

## 2011-03-31 ENCOUNTER — Ambulatory Visit
Admission: RE | Admit: 2011-03-31 | Discharge: 2011-03-31 | Disposition: A | Discharge status: Home / Self Care | Payer: Medicare Other | Source: Ambulatory Visit | Attending: Internal Medicine | Admitting: Internal Medicine

## 2011-03-31 DIAGNOSIS — Z1231 Encounter for screening mammogram for malignant neoplasm of breast: Secondary | ICD-10-CM

## 2011-04-08 ENCOUNTER — Encounter (HOSPITAL_COMMUNITY): Payer: Self-pay | Admitting: *Deleted

## 2011-04-08 ENCOUNTER — Emergency Department (HOSPITAL_COMMUNITY)
Admission: EM | Admit: 2011-04-08 | Discharge: 2011-04-09 | Disposition: A | Discharge status: Home / Self Care | Payer: Medicare Other | Attending: Emergency Medicine | Admitting: Emergency Medicine

## 2011-04-08 DIAGNOSIS — E7800 Pure hypercholesterolemia, unspecified: Secondary | ICD-10-CM | POA: Insufficient documentation

## 2011-04-08 DIAGNOSIS — E119 Type 2 diabetes mellitus without complications: Secondary | ICD-10-CM | POA: Insufficient documentation

## 2011-04-08 DIAGNOSIS — I129 Hypertensive chronic kidney disease with stage 1 through stage 4 chronic kidney disease, or unspecified chronic kidney disease: Secondary | ICD-10-CM | POA: Insufficient documentation

## 2011-04-08 DIAGNOSIS — J45901 Unspecified asthma with (acute) exacerbation: Secondary | ICD-10-CM | POA: Insufficient documentation

## 2011-04-08 DIAGNOSIS — Z794 Long term (current) use of insulin: Secondary | ICD-10-CM | POA: Insufficient documentation

## 2011-04-08 DIAGNOSIS — N189 Chronic kidney disease, unspecified: Secondary | ICD-10-CM | POA: Insufficient documentation

## 2011-04-08 DIAGNOSIS — D649 Anemia, unspecified: Secondary | ICD-10-CM | POA: Insufficient documentation

## 2011-04-08 LAB — CBC
Platelets: 199 10*3/uL (ref 150–400)
RDW: 14.5 % (ref 11.5–15.5)
WBC: 9 10*3/uL (ref 4.0–10.5)

## 2011-04-08 LAB — BASIC METABOLIC PANEL
Calcium: 9.1 mg/dL (ref 8.4–10.5)
Creatinine, Ser: 1.84 mg/dL — ABNORMAL HIGH (ref 0.50–1.10)
GFR calc Af Amer: 31 mL/min — ABNORMAL LOW (ref >90)

## 2011-04-08 MED ORDER — ALBUTEROL SULFATE (5 MG/ML) 0.5% IN NEBU
5.0000 mg | INHALATION_SOLUTION | Freq: Once | RESPIRATORY_TRACT | Status: AC
  Administered 2011-04-08: 5 mg via RESPIRATORY_TRACT
  Filled 2011-04-08: qty 1

## 2011-04-08 NOTE — ED Provider Notes (Signed)
History     CSN: [id number]  Arrival date & time 04/08/11  2134   First MD Initiated Contact with Patient 04/08/11 2209      Chief Complaint  Patient presents with  . Wheezing  . Shortness of Breath    (Consider location/radiation/quality/duration/timing/severity/associated sxs/prior treatment) HPI signs of wheezing and shortness of breath onset 2 days ago typical of asthma no pain anywhere. Air inhaler with transient and partial relief. Associated symptoms include mild cough denies fever denies other associated symptoms  Past Medical History  Diagnosis Date  . Arthritis   . Lumbar disc disease   . Asthma   . Depression   . Diabetes mellitus   . GERD (gastroesophageal reflux disease)   . Hypercholesterolemia   . HTN (hypertension)   . Peptic ulcer disease   . Valgus foot     right   . Gastric ulcer   . Adenomatous colon polyp   . Diverticulosis   . Internal hemorrhoids   . 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 Anemia   . Umbilical hernia     Past Surgical History  Procedure Date  . Total knee arthroplasty     Bilateral  . Incise and drain abcess     Abdominal Wall  . Umbilical hernia repair [id number]    incarcerated  . Lipoma resection     Right Neck  . Colonoscopy 2209 or 2010     2001, 2005, 2009  colonoscopy.  [place]  . Upper gastrointestinal endoscopy 2001    medoff Grade A esophagitis    History reviewed. No pertinent family history.  History  Substance Use Topics  . Smoking status: Former Smoker -- 2.0 packs/day for 5 years    Types: Cigarettes    Quit date: 01/10/1958  . Smokeless tobacco: Not on file  . Alcohol Use: No     Quit when she quit smoking    OB History    Grav Para Term Preterm Abortions TAB SAB Ect Mult Living                  Review of Systems  Constitutional: Negative.   HENT: Negative.   Respiratory: Positive for cough, shortness of breath and wheezing.   Cardiovascular: Negative.   Gastrointestinal:  Negative.   Musculoskeletal: Negative.   Skin: Negative.   Neurological: Negative.   Hematological: Negative.   Psychiatric/Behavioral: Negative.   All other systems reviewed and are negative.    Allergies  Review of patient's allergies indicates no known allergies.  Home Medications   Current Outpatient Rx  Name Route Sig Dispense Refill  . AMLODIPINE BESYLATE 5 MG PO TABS Oral Take 10 mg by mouth daily.     Marland Kitchen BETAMETHASONE DIPROPIONATE AUG 0.05 % EX CREA Topical Apply 1 application topically 2 (two) times daily. Apply to left leg    . ESOMEPRAZOLE MAGNESIUM 40 MG PO CPDR Oral Take 40 mg by mouth daily before breakfast.     . FUROSEMIDE 40 MG PO TABS Oral Take 80 mg by mouth daily.     Marland Kitchen GALANTAMINE HYDROBROMIDE 8 MG PO TABS Oral Take 8 mg by mouth 2 (two) times daily.    Marland Kitchen GLIPIZIDE ER 10 MG PO TB24 Oral Take 10 mg by mouth 2 (two) times daily.    . INSULIN GLARGINE 100 UNIT/ML [place] SOLN Subcutaneous Inject 35 Units into the skin daily.    Marland Kitchen METFORMIN HCL ER 500 MG PO TB24 Oral Take 1,000 mg  by mouth 2 (two) times daily.     Marland Kitchen POTASSIUM CHLORIDE CRYS ER 20 MEQ PO TBCR Oral Take 20 mEq by mouth daily.    Marland Kitchen SIMVASTATIN 20 MG PO TABS Oral Take 20 mg by mouth every evening.    Marland Kitchen ONDANSETRON HCL 4 MG PO TABS Oral Take 4 mg by mouth every 4 (four) hours as needed. For nausea      BP 183/58  Pulse 78  Resp 28  SpO2 100%  Physical Exam  Nursing note and vitals reviewed. Constitutional: She appears well-developed and well-nourished. No distress.  HENT:  Head: Normocephalic and atraumatic.  Eyes: Conjunctivae are normal. Pupils are equal, round, and reactive to light.  Neck: Neck supple. No tracheal deviation present. No thyromegaly present.  Cardiovascular: Normal rate and regular rhythm.   No murmur heard. Pulmonary/Chest: Effort normal and breath sounds normal.        speaks in sentencesprolonged expiratory phase with expiratory wheezes no use of accessory muscles   Abdominal:  Soft. Bowel sounds are normal. She exhibits no distension. There is no tenderness.  Musculoskeletal: Normal range of motion. She exhibits no edema and no tenderness.       Right lower extremity with in a metal brace bilateral lower extremities with 1+ pretibial pitting edema  Neurological: She is alert. Coordination normal.  Skin: Skin is warm and dry. No rash noted.  Psychiatric: She has a normal mood and affect.    ED Course  Procedures (including critical care time) 12:30 AM breathing is not at baseline after treatment with albuterol here. Prednisone ordered continuous nebulization albuterol 10 mg ordered by me Labs Reviewed - No data to display No results found. Results for orders placed during the hospital encounter of [id number]  BASIC METABOLIC PANEL      Component Value Range   Sodium 138  135 - 145 (mEq/L)   Potassium 3.7  3.5 - 5.1 (mEq/L)   Chloride 104  96 - 112 (mEq/L)   CO2 20  19 - 32 (mEq/L)   Glucose, Bld 103 (*) 70 - 99 (mg/dL)   BUN 43 (*) 6 - 23 (mg/dL)   Creatinine, Ser 1.84 (*) 0.50 - 1.10 (mg/dL)   Calcium 9.1  8.4 - 10.5 (mg/dL)   GFR calc non Af Amer 27 (*) >90 (mL/min)   GFR calc Af Amer 31 (*) >90 (mL/min)  CBC      Component Value Range   WBC 9.0  4.0 - 10.5 (K/uL)   RBC 3.29 (*) 3.87 - 5.11 (MIL/uL)   Hemoglobin 9.8 (*) 12.0 - 15.0 (g/dL)   HCT 27.8 (*) 36.0 - 46.0 (%)   MCV 84.5  78.0 - 100.0 (fL)   MCH 29.8  26.0 - 34.0 (pg)   MCHC 35.3  30.0 - 36.0 (g/dL)   RDW 14.5  11.5 - 15.5 (%)   Platelets 199  150 - 400 (K/uL)   Mm Digital Screening  04/04/2011  DG SCREEN MAMMOGRAM BILATERAL Bilateral CC and MLO view(s) were taken.  DIGITAL SCREENING MAMMOGRAM WITH CAD: The breast tissue is heterogeneously dense.  No masses or malignant type calcifications are  identified.  Compared with prior studies.  Images were processed with CAD.  IMPRESSION: No specific mammographic evidence of malignancy.  Next screening mammogram is recommended in one  year.  A result  letter of this screening mammogram will be mailed directly to the patient.  ASSESSMENT: Negative - BI-RADS 1  Screening mammogram in 1 year. ,  No diagnosis found.   Pt asined outn to Dr. Elise Benne at 1250 am MDM  Patient to be dispositioned based on respiratory status  Diagnosis #1 exacerbation of asthma #2 chronic renal insufficiency #3 chronic anemia       Orlie Dakin, MD 04/09/11 [phone]

## 2011-04-08 NOTE — ED Notes (Signed)
Pt presented to ED w/ shortness of breath, increased work of breathing, and audible wheezing on arrival - pt having difficulty speaking phrases. Pt admits to hx of asthma and states she has been using her home inhalar w/o relief.

## 2011-04-08 NOTE — Progress Notes (Signed)
RT note: pk flow post tx. 270, FEV1 post tx 1.60. Pf flow predicted 85.7%, FEV1 predicted 69.2.

## 2011-04-08 NOTE — Progress Notes (Signed)
RT notr: pk flow pre tx. 255, FEV1 per tx. 1.55 pk flow predicted 71.4%, FEV1 predicted 67.1%.

## 2011-04-09 MED ORDER — ALBUTEROL SULFATE (5 MG/ML) 0.5% IN NEBU
10.0000 mg | INHALATION_SOLUTION | Freq: Once | RESPIRATORY_TRACT | Status: AC
  Administered 2011-04-09: 10 mg via RESPIRATORY_TRACT
  Filled 2011-04-09: qty 2

## 2011-04-09 MED ORDER — PREDNISONE 20 MG PO TABS: 20.0000 mg | ORAL_TABLET | Freq: Every day | ORAL | Status: DC

## 2011-04-09 MED ORDER — AEROCHAMBER PLUS W/MASK MISC
Status: AC
  Administered 2011-04-09: 05:00:00
  Filled 2011-04-09: qty 1

## 2011-04-09 MED ORDER — ALBUTEROL SULFATE HFA 108 (90 BASE) MCG/ACT IN AERS
2.0000 | INHALATION_SPRAY | RESPIRATORY_TRACT | Status: AC
  Administered 2011-04-09: 2 via RESPIRATORY_TRACT
  Filled 2011-04-09: qty 6.7

## 2011-04-09 MED ORDER — PREDNISONE 20 MG PO TABS
40.0000 mg | ORAL_TABLET | Freq: Once | ORAL | Status: AC
  Administered 2011-04-09: 40 mg via ORAL
  Filled 2011-04-09: qty 2

## 2011-04-09 NOTE — Discharge Instructions (Signed)
RESOURCE GUIDE  Dental Problems  Patients with Medicaid: [place] [facility] Phone:  [phone]                                                  Phone:  [phone]  If unable to pay or uninsured, contact:  [facility] or [facility]. to become qualified for the adult dental clinic.  Chronic Pain Problems Contact [facility]  [phone] Patients need to be referred by their primary care doctor.  Insufficient Money for Medicine Contact [employer]:  call "211" or [place] [phone].  No Primary Care Doctor Call [facility]  [phone] Other agencies that provide inexpensive medical care    [place]  [phone]    [facility]  [place]  [phone]    [facility]  [phone]    [facility]  [place]  [place]  [phone] [place]   [phone] (emergency services [phone])  [facility]  [phone] [facility] [phone] [facility] [place] [phone] [place] [phone] Residential & Outpatient Substance Abuse Program  [phone]  Abuse/Neglect [place] [phone] [place] [phone] (After Hours)  Emergency [place] [phone]  [place] [facility] [place] [phone] [place] [phone]  MRSA Hotline #:   727-235-4053    [facility] Dept. [address]      [place] Phone:  [phone]                                   Phone:  [phone]                 Phone:  [place] Phone:  [phone]  [place] [phone] (After Hours)    Take the prescription as directed.  Use the albuterol inhaler:  2 to 4 puffs every 4 hours for the next 7 days.  Be aware that the prednisone (steroid) will increase your blood sugars.  Call your regular medical doctor on Monday to schedule a follow up appointment on Monday, and to receive further instructions regarding your blood sugars if they increase.  Return to the Emergency Department immediately sooner if worsening.

## 2011-04-09 NOTE — ED Notes (Signed)
Resp therapy notified for hour long neb treatment

## 2011-04-09 NOTE — ED Provider Notes (Signed)
0100:  Pt received with continuous neb in progress, PO prednisone already given.  Pt c/o "my asthma attack" for the past 2 days, using her home MDI with partial/transient relief.  Lungs diminished bilat, no wheezing or stridor, resps without distress.  0400:  Neb completed.  Lungs CTA bilat, no wheezing, resps easy, speaking full sentences with ease, Sats 99% R/A.  Pt ambulates very little with her rolling walker at baseline, pt ambulated her usual distance with her family watching.  Pt denies SOB while ambulating with lowest Sat 96% R/A while ambulating at her usual baseline gait per pt and family watching her.  Pt wants to go home now, feels "much better."  Will dose MDI teach/treat here, rx prednisone (pt informed her CBG will likely increase), f/u PMD on Monday.  Pt and family verb understanding and agreeable.   Alfonzo Feller, DO 04/09/11 [facility]

## 2011-04-09 NOTE — ED Notes (Signed)
O2 sat remained 99% on RA while laying in bed. O2 sat dropped to 96% on RA while ambulating.

## 2011-04-20 ENCOUNTER — Encounter: Payer: Self-pay | Admitting: Pulmonary Disease

## 2011-04-20 ENCOUNTER — Other Ambulatory Visit: Payer: Medicare Other

## 2011-04-20 ENCOUNTER — Ambulatory Visit (INDEPENDENT_AMBULATORY_CARE_PROVIDER_SITE_OTHER): Payer: Medicare Other | Admitting: Pulmonary Disease

## 2011-04-20 DIAGNOSIS — J45909 Unspecified asthma, uncomplicated: Secondary | ICD-10-CM

## 2011-04-20 DIAGNOSIS — J449 Chronic obstructive pulmonary disease, unspecified: Secondary | ICD-10-CM | POA: Insufficient documentation

## 2011-04-20 DIAGNOSIS — J4489 Other specified chronic obstructive pulmonary disease: Secondary | ICD-10-CM

## 2011-04-20 DIAGNOSIS — J309 Allergic rhinitis, unspecified: Secondary | ICD-10-CM

## 2011-04-20 HISTORY — DX: Other specified chronic obstructive pulmonary disease: J44.89

## 2011-04-20 HISTORY — DX: Chronic obstructive pulmonary disease, unspecified: J44.9

## 2011-04-20 MED ORDER — FLUTICASONE PROPIONATE 50 MCG/ACT NA SUSP: 2.0000 | Freq: Every day | NASAL | Status: DC

## 2011-04-20 MED ORDER — ALBUTEROL SULFATE HFA 108 (90 BASE) MCG/ACT IN AERS: 2.0000 | INHALATION_SPRAY | Freq: Four times a day (QID) | RESPIRATORY_TRACT | Status: DC | PRN

## 2011-04-20 MED ORDER — BUDESONIDE-FORMOTEROL FUMARATE 160-4.5 MCG/ACT IN AERO: 2.0000 | INHALATION_SPRAY | Freq: Two times a day (BID) | RESPIRATORY_TRACT | Status: DC

## 2011-04-20 NOTE — Patient Instructions (Signed)
Symbicort two puffs twice per day, and rinse mouth after using>>This was prescribed by Dr. Nichola Sizer two puffs as needed for cough, wheeze, or chest congestion Nasal irrigation (saline nasal spray) daily before using flonase Flonase two sprays in each nostril once daily Will schedule breathing test (PFT) Lab test today Will need to get copy of chest xray report from Dr. Reynaldo Minium Follow up in 3 to 4 weeks

## 2011-04-20 NOTE — Assessment & Plan Note (Signed)
She has symptoms of allergic rhinitis.  This is likely contributing to her asthma symptoms as well.  She is to use nasal irrigation and flonase.  She can use OTC anti-histamine prn.  Will check RAST with IgE to further assess.

## 2011-04-20 NOTE — Progress Notes (Signed)
Chief Complaint  Patient presents with  . Advice Only    per Dr. Reynaldo Minium: SOB w/ exertion, wheezing, chest tx--pt has asthma    History of Present Illness: [name] is a 71 y.o. female for evaluation of dyspnea, and wheezing.  She had trouble with asthma as a child.  This seemed to get better in her 20's.  She started noticing more trouble with her breathing again starting in 2012.  She does not recall any specific trigger for this.  She has trouble with allergies.  She gets itchy eyes, and runny nose with post-nasal drip.  This happens most in the Spring and Fall.  She tends to sneeze when around freshly cut grass.  She denies skin rash or hives.  She does not have any difficulty taking aspirin.  She has been getting a dry cough, wheeze, and chest tightness.  She has an albuterol inhaler, and this helps.  She has been using her albuterol more recently.  She was prescribed symbicort form her PCP yesterday, but has not picked this up yet.  She was seen in the emergency room on March 29, and was told she had an asthma exacerbation.  She was discharged from the ED with prednisone taper, and this helped.  She used to have a pet dog, and did not notice any trouble with this exposure.  She denies history of pneumonia or tuberculosis.  She used to work for [employer].  She smoked briefly in her 5's, and her father used to smoke.     She is followed by Dr. Carlean Purl with GI for reflux.  This seems to be under control with nexium.  She had chest xray at PCP office yesterday, but I do not have this available for review at present.   Past Medical History  Diagnosis Date  . Arthritis   . Lumbar disc disease   . Asthma   . Depression   . Diabetes mellitus   . GERD (gastroesophageal reflux disease)   . Hypercholesterolemia   . HTN (hypertension)   . Peptic ulcer disease   . Valgus foot     right   . Gastric ulcer   . Adenomatous colon polyp   . Diverticulosis   . Internal hemorrhoids     . 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 Anemia   . Umbilical hernia     Past Surgical History  Procedure Date  . Total knee arthroplasty     Bilateral  . Incise and drain abcess     Abdominal Wall  . Umbilical hernia repair [id number]    incarcerated  . Lipoma resection     Right Neck  . Colonoscopy 2209 or 2010     2001, 2005, 2009  colonoscopy.  [place]  . Upper gastrointestinal endoscopy 2001    medoff Grade A esophagitis    Current Outpatient Prescriptions on File Prior to Visit  Medication Sig Dispense Refill  . amLODipine (NORVASC) 5 MG tablet Take 10 mg by mouth daily.       Marland Kitchen augmented betamethasone dipropionate (DIPROLENE-AF) 0.05 % cream Apply 1 application topically 2 (two) times daily. Apply to left leg      . esomeprazole (NEXIUM) 40 MG capsule Take 40 mg by mouth daily before breakfast.       . furosemide (LASIX) 40 MG tablet Take 80 mg by mouth daily.       Marland Kitchen galantamine (RAZADYNE) 8 MG tablet Take 8 mg by mouth 2 (  two) times daily.      Marland Kitchen glipiZIDE (GLUCOTROL XL) 10 MG 24 hr tablet Take 10 mg by mouth 2 (two) times daily.      . insulin glargine (LANTUS) 100 UNIT/ML injection Inject 35 Units into the skin daily.      . metFORMIN (GLUCOPHAGE-XR) 500 MG 24 hr tablet Take 1,000 mg by mouth 2 (two) times daily.       . ondansetron (ZOFRAN) 4 MG tablet Take 4 mg by mouth every 4 (four) hours as needed. For nausea      . potassium chloride SA (K-DUR,KLOR-CON) 20 MEQ tablet Take 20 mEq by mouth daily.      . predniSONE (DELTASONE) 20 MG tablet Take 1 tablet (20 mg total) by mouth daily.  10 tablet  0  . simvastatin (ZOCOR) 20 MG tablet Take 20 mg by mouth every evening.        No Known Allergies  family history includes Breast cancer in her maternal aunt.   reports that she quit smoking about 53 years ago. Her smoking use included Cigarettes. She has a 10 pack-year smoking history. She does not have any smokeless tobacco history on file. She reports that she  does not drink alcohol or use illicit drugs.  Review of Systems Difficulty with memory.  Walks with walker.  Gets pains in legs when walking.  Has occasional heartburn.  Gets leg swelling.  Remained or ROS negative.  Physical Exam: BP 160/70  Pulse 72  Temp(Src) 98.2 F (36.8 C) (Oral)  Ht 5\' 4"  (1.626 m)  Wt 221 lb 6.4 oz (100.426 kg)  BMI 38.00 kg/m2  SpO2 98%  Body mass index is 38.00 kg/(m^2).  General - No distress, obese HEENT - Left eye pstosis, wears glasses, EOMI, no sinus tenderness, clear nasal discharge, TM clear, no oral exudate, no LAN Cardiac - s1s2 regular, no murmur Chest - good air entry, no wheeze/rales Abdomen - obese, soft, nontender, + bowel sounds Extremities - 1+ leg edema, valgus deformities, Rt foot in brace Neurologic - normal strength, CN intact, difficulty with recall memory Skin - chronic venous stasis changes Psychiatric - pleasant, normal mood/behavior  Assessment/Plan:  Outpatient Encounter Prescriptions as of 04/20/2011  Medication Sig Dispense Refill  . amLODipine (NORVASC) 5 MG tablet Take 10 mg by mouth daily.       Marland Kitchen augmented betamethasone dipropionate (DIPROLENE-AF) 0.05 % cream Apply 1 application topically 2 (two) times daily. Apply to left leg      . esomeprazole (NEXIUM) 40 MG capsule Take 40 mg by mouth daily before breakfast.       . furosemide (LASIX) 40 MG tablet Take 80 mg by mouth daily.       Marland Kitchen galantamine (RAZADYNE) 8 MG tablet Take 8 mg by mouth 2 (two) times daily.      Marland Kitchen glipiZIDE (GLUCOTROL XL) 10 MG 24 hr tablet Take 10 mg by mouth 2 (two) times daily.      . insulin glargine (LANTUS) 100 UNIT/ML injection Inject 35 Units into the skin daily.      . metFORMIN (GLUCOPHAGE-XR) 500 MG 24 hr tablet Take 1,000 mg by mouth 2 (two) times daily.       . ondansetron (ZOFRAN) 4 MG tablet Take 4 mg by mouth every 4 (four) hours as needed. For nausea      . potassium chloride SA (K-DUR,KLOR-CON) 20 MEQ tablet Take 20 mEq by mouth  daily.      . predniSONE (DELTASONE) 20 MG  tablet Take 1 tablet (20 mg total) by mouth daily.  10 tablet  0  . simvastatin (ZOCOR) 20 MG tablet Take 20 mg by mouth every evening.        Sloan Takagi Pager:  [phone] 04/20/2011, 2:38 PM

## 2011-04-20 NOTE — Progress Notes (Signed)
  Subjective:    Patient ID: [name], female    DOB: May 13, 1940, 71 y.o.   MRN: [id number]  HPI    Review of Systems  Constitutional: Negative for appetite change and unexpected weight change.  HENT: Negative for congestion, sore throat, trouble swallowing and dental problem.   Respiratory: Positive for cough and shortness of breath.   Cardiovascular: Negative for chest pain, palpitations and leg swelling.  Gastrointestinal: Negative for abdominal pain.  Musculoskeletal: Negative for joint swelling.  Skin: Positive for rash.  Neurological: Negative for headaches.  Psychiatric/Behavioral: Positive for dysphoric mood. The patient is nervous/anxious.        Objective:   Physical Exam        Assessment & Plan:

## 2011-04-20 NOTE — Assessment & Plan Note (Signed)
She has symptoms consistent with asthma.  She has improvement with inhaler therapy, and recent prednisone therapy.  She was prescribed symbicort, but has not picked this up yet.  I have advised her to start this, and continue proair prn.  Will need to get copy of her recent CXR from her PCP office.  Will arrange for PFT to further assess.

## 2011-04-21 ENCOUNTER — Telehealth: Payer: Self-pay | Admitting: *Deleted

## 2011-04-21 NOTE — Telephone Encounter (Signed)
Dr. Jacquiline Doe office called back and I was advised the report is not ready yet and will fax once it is. Will await fax

## 2011-04-21 NOTE — Telephone Encounter (Signed)
I have called yesterday for the report and had to leave this message in medical records vm and I called again and had to do the same. Wcb if not received by the end of today

## 2011-04-21 NOTE — Telephone Encounter (Signed)
Message copied by Inge Rise on Thu Apr 21, 2011 11:38 AM ------      Message from: Chesley Mires      Created: Thu Apr 21, 2011 11:31 AM       Can you call Dr. Jacquiline Doe office to have them fax over Ms. Pacha's CXR report from 04/19/11 when available.  Thanks

## 2011-04-22 LAB — ALLERGY FULL PROFILE
Alternaria Alternata: <0.1 kU/L (ref <0.35)
Aspergillus fumigatus, IgG: 0.26 kU/L (ref <0.35)
Bahia Grass: 0.19 kU/L (ref <0.35)
Cat Dander: 1.43 kU/L — ABNORMAL HIGH (ref <0.35)
Curvularia lunata: <0.1 kU/L (ref <0.35)
D. farinae: 0.97 kU/L — ABNORMAL HIGH (ref <0.35)
Elm IgE: 0.46 kU/L — ABNORMAL HIGH (ref <0.35)
Fescue: 0.16 kU/L (ref <0.35)
G009 Red Top: 0.28 kU/L (ref <0.35)
Lamb's Quarters: 0.31 kU/L (ref <0.35)
Oak: <0.1 kU/L (ref <0.35)
Sycamore Tree: 0.28 kU/L (ref <0.35)

## 2011-04-25 ENCOUNTER — Telehealth: Payer: Self-pay | Admitting: Pulmonary Disease

## 2011-04-25 NOTE — Telephone Encounter (Signed)
Received report for CXR from 04/19/11>>Cardiomegaly, no acute disease process.

## 2011-04-25 NOTE — Telephone Encounter (Signed)
CXR report has been received and placed in VS look at

## 2011-04-26 ENCOUNTER — Telehealth: Payer: Self-pay | Admitting: Pulmonary Disease

## 2011-04-26 NOTE — Telephone Encounter (Signed)
RAST >> dust mites, cat dander, dog dander, Elm, Plantain  IgE 63.2   Attempted to call pt to discuss results.  Will have my nurse inform patient that she has allergies to dust mites, cats, dogs, and Elm tree bark and plantains.  She is to continue her current regimen as detailed at last visit, and will discuss further at next visit.

## 2011-04-26 NOTE — Telephone Encounter (Signed)
Patient returned call, and I discussed results with her over the phone.

## 2011-05-17 ENCOUNTER — Encounter: Payer: Self-pay | Admitting: Pulmonary Disease

## 2011-05-17 ENCOUNTER — Ambulatory Visit (INDEPENDENT_AMBULATORY_CARE_PROVIDER_SITE_OTHER): Payer: Medicare Other | Admitting: Pulmonary Disease

## 2011-05-17 DIAGNOSIS — J45909 Unspecified asthma, uncomplicated: Secondary | ICD-10-CM

## 2011-05-17 DIAGNOSIS — J309 Allergic rhinitis, unspecified: Secondary | ICD-10-CM

## 2011-05-17 MED ORDER — MONTELUKAST SODIUM 10 MG PO TABS: 10.0000 mg | ORAL_TABLET | Freq: Every day | ORAL | Status: DC

## 2011-05-17 NOTE — Progress Notes (Signed)
Chief Complaint  Patient presents with  . Follow-up    Pt c/o increase SOB at rest and w/ exertion, cough w/ very little yellow to white phlem, wheezing     History of Present Illness: [name] is a 71 y.o. female with remote history of smoking and with dyspnea related to asthma and allergic rhinitis.  She was scheduled to have PFT today. She did not remember this was scheduled.  Her breathing has improved some since starting symbicort.  She does not think she is taking prednisone anymore.  She has been using proair several times per day, and this helps. She still has cough, wheeze, chest congestion, and gets winded with activity.  She has trouble with her breathing and allergies around fresh cut grass.    Past Medical History  Diagnosis Date  . Arthritis   . Lumbar disc disease   . Asthma   . Depression   . Diabetes mellitus   . GERD (gastroesophageal reflux disease)   . Hypercholesterolemia   . HTN (hypertension)   . Peptic ulcer disease   . Valgus foot     right   . Gastric ulcer   . Adenomatous colon polyp   . Diverticulosis   . Internal hemorrhoids   . 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 Anemia   . Umbilical hernia     Past Surgical History  Procedure Date  . Total knee arthroplasty     Bilateral  . Incise and drain abcess     Abdominal Wall  . Umbilical hernia repair [id number]    incarcerated  . Lipoma resection     Right Neck  . Colonoscopy 2209 or 2010     2001, 2005, 2009  colonoscopy.  [place]omas  . Upper gastrointestinal endoscopy 2001    medoff Grade A esophagitis    No Known Allergies  Physical Exam:  Blood pressure 122/64, pulse 76, temperature 98.2 F (36.8 C), temperature source Oral, height 5\' 4"  (1.626 m), weight 227 lb 12.8 oz (103.329 kg), SpO2 98.00%.  Body mass index is 39.10 kg/(m^2).  Wt Readings from Last 2 Encounters:  05/17/11 227 lb 12.8 oz (103.329 kg)  04/20/11 221 lb 6.4 oz (100.426 kg)    General - No  distress, obese  HEENT - Left eye pstosis, wears glasses, EOMI, no sinus tenderness, clear nasal discharge, no oral exudate, no LAN  Cardiac - s1s2 regular, no murmur  Chest - prolonged exhalation, faint expiratory wheeze clear with coughing Abdomen - obese, soft, nontender, + bowel sounds  Extremities - 1+ leg edema, valgus deformities, Rt foot in brace  Neurologic - normal strength, difficulty with recall memory  Skin - chronic venous stasis changes  Psychiatric - pleasant, normal mood/behavior  CXR from 04/19/11>>Cardiomegaly, no acute disease process.  RAST >> dust mites, cat dander, dog dander, Elm, Plantain  IgE 63.2  Assessment/Plan:  Outpatient Encounter Prescriptions as of 05/17/2011  Medication Sig Dispense Refill  . albuterol (PROAIR HFA) 108 (90 BASE) MCG/ACT inhaler Inhale 2 puffs into the lungs every 6 (six) hours as needed for wheezing.      Marland Kitchen amLODipine (NORVASC) 5 MG tablet Take 10 mg by mouth daily.       Marland Kitchen augmented betamethasone dipropionate (DIPROLENE-AF) 0.05 % cream Apply 1 application topically 2 (two) times daily. Apply to left leg      . budesonide-formoterol (SYMBICORT) 160-4.5 MCG/ACT inhaler Inhale 2 puffs into the lungs 2 (two) times daily.  1 Inhaler  12  . esomeprazole (NEXIUM) 40 MG capsule Take 40 mg by mouth daily before breakfast.       . fluticasone (FLONASE) 50 MCG/ACT nasal spray Place 2 sprays into the nose daily.  16 g  2  . furosemide (LASIX) 40 MG tablet Take 80 mg by mouth daily.       Marland Kitchen galantamine (RAZADYNE) 8 MG tablet Take 8 mg by mouth 2 (two) times daily.      Marland Kitchen glipiZIDE (GLUCOTROL XL) 10 MG 24 hr tablet Take 10 mg by mouth 2 (two) times daily.      . insulin glargine (LANTUS) 100 UNIT/ML injection Inject 35 Units into the skin daily.      . ondansetron (ZOFRAN) 4 MG tablet Take 4 mg by mouth every 4 (four) hours as needed. For nausea      . potassium chloride SA (K-DUR,KLOR-CON) 20 MEQ tablet Take 20 mEq by mouth daily.      .  simvastatin (ZOCOR) 20 MG tablet Take 20 mg by mouth every evening.      . montelukast (SINGULAIR) 10 MG tablet Take 1 tablet (10 mg total) by mouth daily.  30 tablet  5  . DISCONTD: metFORMIN (GLUCOPHAGE-XR) 500 MG 24 hr tablet Take 1,000 mg by mouth 2 (two) times daily.       Marland Kitchen DISCONTD: predniSONE (DELTASONE) 20 MG tablet Take 1 tablet (20 mg total) by mouth daily.  10 tablet  0    Torben Soloway Pager:  [phone] 05/17/2011, 4:38 PM

## 2011-05-17 NOTE — Assessment & Plan Note (Signed)
She still has difficulty with her asthma.  She has noticed some improvement since using symbicort, but still needs to use proair frequently.  Will add singulair to her regimen.  Will re-assess status in 2 months, and then determine when she can get set up to do PFT's.

## 2011-05-17 NOTE — Patient Instructions (Signed)
Start montelukast (singulair) 10 mg pill>>take one pill nightly before going to bed Symbicort two puffs twice per day, and rinse mouth after using Proair two puffs as needed for cough, wheeze, or chest congestion Follow up in 2 months

## 2011-05-17 NOTE — Assessment & Plan Note (Addendum)
She has several allergies detected on RAST and elevated IgE.  She is to continue flonase.  Will add singulair.  Depending on her response she may need additional interventions for her allergies.  Discussed environmental control techniques to limit allergen exposure.

## 2011-06-16 ENCOUNTER — Telehealth: Payer: Self-pay | Admitting: Pulmonary Disease

## 2011-06-16 DIAGNOSIS — J45909 Unspecified asthma, uncomplicated: Secondary | ICD-10-CM

## 2011-06-16 NOTE — Telephone Encounter (Signed)
Spoke with [name] at [facility]. She states that the pt has appt there in July to have f/u ct s/p renal ablation. Pt needs bmet drawn prior to this, and she is asking if we can order this at the pt's f/u here on 07/20/11. Please advise, thanks!

## 2011-06-17 NOTE — Telephone Encounter (Signed)
Okay to place order for BMET in July.

## 2011-06-17 NOTE — Telephone Encounter (Signed)
Order placed [facility] for [name]

## 2011-06-20 ENCOUNTER — Other Ambulatory Visit: Payer: Self-pay | Admitting: Interventional Radiology

## 2011-06-20 DIAGNOSIS — C649 Malignant neoplasm of unspecified kidney, except renal pelvis: Secondary | ICD-10-CM

## 2011-06-20 NOTE — Telephone Encounter (Signed)
Spoke with [name] and notified that this was done.

## 2011-07-20 ENCOUNTER — Ambulatory Visit (INDEPENDENT_AMBULATORY_CARE_PROVIDER_SITE_OTHER): Payer: Medicare Other | Admitting: Pulmonary Disease

## 2011-07-20 ENCOUNTER — Encounter: Payer: Self-pay | Admitting: Pulmonary Disease

## 2011-07-20 VITALS — BP 150/70 | HR 69 | Temp 97.6°F | Ht 64.0 in | Wt 212.8 lb

## 2011-07-20 DIAGNOSIS — R490 Dysphonia: Secondary | ICD-10-CM

## 2011-07-20 DIAGNOSIS — J45909 Unspecified asthma, uncomplicated: Secondary | ICD-10-CM

## 2011-07-20 DIAGNOSIS — J4530 Mild persistent asthma, uncomplicated: Secondary | ICD-10-CM

## 2011-07-20 MED ORDER — AEROCHAMBER MV MISC: Status: DC

## 2011-07-20 NOTE — Progress Notes (Signed)
Chief Complaint  Patient presents with  . Follow-up    Pt reports she feels "terrible, some days are rough",  SOB on and off sometimes worse than other times, chest tightness, wheezing, denies coughing,    History of Present Illness: [name] is a 71 y.o. female with remote history of smoking and with dyspnea related to asthma and allergic rhinitis.  She feels inhalers help.  She has been getting hoarse and wheeze, but a lot of this wheeze comes from her throat.  She gets winded easily with minimal activity and in hot weather.  She has trouble with steam from showering.  She has sinus congestion and post-nasal drip.  She has been using flonase.  She thinks her sinuses are improved since using singulair.  She is using symbicort twice per day, and proair 2 to 3 times per day.   Past Medical History  Diagnosis Date  . Arthritis   . Lumbar disc disease   . Asthma   . Depression   . Diabetes mellitus   . GERD (gastroesophageal reflux disease)   . Hypercholesterolemia   . HTN (hypertension)   . Peptic ulcer disease   . Valgus foot     right   . Gastric ulcer   . Adenomatous colon polyp   . Diverticulosis   . Internal hemorrhoids   . 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 Anemia   . Umbilical hernia     Past Surgical History  Procedure Date  . Total knee arthroplasty     Bilateral  . Incise and drain abcess     Abdominal Wall  . Umbilical hernia repair [id number]    incarcerated  . Lipoma resection     Right Neck  . Colonoscopy 2209 or 2010     2001, 2005, 2009  colonoscopy.  [place]  . Upper gastrointestinal endoscopy 2001    medoff Grade A esophagitis    No Known Allergies  Physical Exam:  Blood pressure 150/70, pulse 69, temperature 97.6 F (36.4 C), temperature source Oral, height 5\' 4"  (1.626 m), weight 212 lb 12.8 oz (96.525 kg), SpO2 100.00%.  Body mass index is 36.53 kg/(m^2).  Wt Readings from Last 2 Encounters:  07/20/11 212 lb 12.8 oz (96.525  kg)  05/17/11 227 lb 12.8 oz (103.329 kg)    General - No distress, obese  HEENT - Left eye pstosis, wears glasses, EOMI, no sinus tenderness, clear nasal discharge, no oral exudate, no LAN  Cardiac - s1s2 regular, no murmur  Chest - prolonged exhalation, faint expiratory wheeze clear with coughing mostly from upper airway Abdomen - obese, soft, nontender, + bowel sounds  Extremities - 1+ leg edema, valgus deformities, Rt foot in brace  Neurologic - normal strength, difficulty with recall memory  Skin - chronic venous stasis changes  Psychiatric - pleasant, normal mood/behavior    Assessment/Plan:  Outpatient Encounter Prescriptions as of 07/20/2011  Medication Sig Dispense Refill  . albuterol (PROAIR HFA) 108 (90 BASE) MCG/ACT inhaler Inhale 2 puffs into the lungs every 6 (six) hours as needed for wheezing.      Marland Kitchen amLODipine (NORVASC) 5 MG tablet Take 10 mg by mouth daily.       Marland Kitchen augmented betamethasone dipropionate (DIPROLENE-AF) 0.05 % cream Apply 1 application topically 2 (two) times daily. Apply to left leg      . budesonide-formoterol (SYMBICORT) 160-4.5 MCG/ACT inhaler Inhale 2 puffs into the lungs 2 (two) times daily.  1 Inhaler  12  . esomeprazole (NEXIUM) 40 MG capsule Take 40 mg by mouth daily before breakfast.       . fluticasone (FLONASE) 50 MCG/ACT nasal spray Place 2 sprays into the nose daily.  16 g  2  . furosemide (LASIX) 40 MG tablet Take 80 mg by mouth daily.       . insulin glargine (LANTUS) 100 UNIT/ML injection Inject 35 Units into the skin daily.      . ondansetron (ZOFRAN) 4 MG tablet Take 4 mg by mouth every 4 (four) hours as needed. For nausea      . potassium chloride SA (K-DUR,KLOR-CON) 20 MEQ tablet Take 20 mEq by mouth daily.      . simvastatin (ZOCOR) 20 MG tablet Take 20 mg by mouth every evening.      . galantamine (RAZADYNE) 8 MG tablet Take 8 mg by mouth 2 (two) times daily.      Marland Kitchen glipiZIDE (GLUCOTROL XL) 10 MG 24 hr tablet Take 10 mg by mouth 2  (two) times daily.      . montelukast (SINGULAIR) 10 MG tablet Take 1 tablet (10 mg total) by mouth daily.  30 tablet  5  . Spacer/Aero-Holding Chambers (AEROCHAMBER MV) inhaler Use as instructed  1 each  0    [name] Pager:  [phone] 07/20/2011, 5:36 PM

## 2011-07-20 NOTE — Assessment & Plan Note (Signed)
She has several allergies detected on RAST and elevated IgE.  She is to continue flonase, and singulair.  She may need additional interventions for her allergies.

## 2011-07-20 NOTE — Patient Instructions (Signed)
Follow up in 3 months

## 2011-07-20 NOTE — Assessment & Plan Note (Signed)
A good portion of her respiratory symptoms may be related to upper airway irritation.  She is to continue her sinus regimen.  Will add spacer device to her inhaler regimen.  She is using nexium per primary care.  If her symptoms persist, then she may need ENT evaluation.

## 2011-07-20 NOTE — Assessment & Plan Note (Signed)
She reports benefit from inhaler therapy.  I don't think she is using her inhalers correctly.  I reviewed proper use of her inhalers, and will provider her with spacer device.  If she continues to have difficulty, then she may need to change to nebulized medications.

## 2011-08-03 ENCOUNTER — Telehealth: Payer: Self-pay | Admitting: Pulmonary Disease

## 2011-08-03 NOTE — Telephone Encounter (Signed)
Order was placed for BMET in June but pt was not sent to lab at July appt with Dr Halford Chessman.  [name] is waiting for these results before she can schedule CT.  LM for pt to call our office regarding coming in for labs to be drawn.

## 2011-08-04 NOTE — Telephone Encounter (Signed)
Pt returned call & can be reached at [phone].  [name]

## 2011-08-04 NOTE — Telephone Encounter (Signed)
Pt aware and states she will come by this week for labs. Montrose Bing, CMA

## 2011-08-09 ENCOUNTER — Other Ambulatory Visit (INDEPENDENT_AMBULATORY_CARE_PROVIDER_SITE_OTHER): Payer: Medicare Other

## 2011-08-09 DIAGNOSIS — J45909 Unspecified asthma, uncomplicated: Secondary | ICD-10-CM

## 2011-08-09 LAB — BASIC METABOLIC PANEL
BUN: 48 mg/dL — ABNORMAL HIGH (ref 6–23)
CO2: 26 mEq/L (ref 19–32)
GFR: 40.42 mL/min — ABNORMAL LOW (ref >60.00)
Glucose, Bld: 90 mg/dL (ref 70–99)
Potassium: 5 mEq/L (ref 3.5–5.1)

## 2011-08-11 ENCOUNTER — Telehealth: Payer: Self-pay | Admitting: Emergency Medicine

## 2011-08-11 NOTE — Telephone Encounter (Signed)
ERROR

## 2011-08-25 ENCOUNTER — Other Ambulatory Visit: Payer: Self-pay | Admitting: Emergency Medicine

## 2011-08-25 DIAGNOSIS — C649 Malignant neoplasm of unspecified kidney, except renal pelvis: Secondary | ICD-10-CM

## 2011-09-21 LAB — CREATININE WITH EST GFR: GFR, Est Non African American: 34 mL/min — ABNORMAL LOW

## 2011-09-22 ENCOUNTER — Ambulatory Visit (HOSPITAL_COMMUNITY)
Admission: RE | Admit: 2011-09-22 | Discharge: 2011-09-22 | Disposition: A | Discharge status: Home / Self Care | Payer: Medicare Other | Source: Ambulatory Visit | Attending: Interventional Radiology | Admitting: Interventional Radiology

## 2011-09-22 ENCOUNTER — Ambulatory Visit
Admission: RE | Admit: 2011-09-22 | Discharge: 2011-09-22 | Disposition: A | Discharge status: Home / Self Care | Payer: Medicare Other | Source: Ambulatory Visit | Attending: Interventional Radiology | Admitting: Interventional Radiology

## 2011-09-22 DIAGNOSIS — K8020 Calculus of gallbladder without cholecystitis without obstruction: Secondary | ICD-10-CM | POA: Insufficient documentation

## 2011-09-22 DIAGNOSIS — I517 Cardiomegaly: Secondary | ICD-10-CM | POA: Insufficient documentation

## 2011-09-22 DIAGNOSIS — M5137 Other intervertebral disc degeneration, lumbosacral region: Secondary | ICD-10-CM | POA: Insufficient documentation

## 2011-09-22 DIAGNOSIS — C649 Malignant neoplasm of unspecified kidney, except renal pelvis: Secondary | ICD-10-CM

## 2011-09-22 DIAGNOSIS — M51379 Other intervertebral disc degeneration, lumbosacral region without mention of lumbar back pain or lower extremity pain: Secondary | ICD-10-CM | POA: Insufficient documentation

## 2011-09-22 DIAGNOSIS — Q619 Cystic kidney disease, unspecified: Secondary | ICD-10-CM | POA: Insufficient documentation

## 2011-09-22 MED ORDER — IOHEXOL 350 MG/ML SOLN
50.0000 mL | Freq: Once | INTRAVENOUS | Status: AC | PRN
  Administered 2011-09-22: 50 mL via INTRAVENOUS

## 2011-09-22 MED ORDER — MIDAZOLAM HCL 5 MG/5ML IJ SOLN: INTRAMUSCULAR | Status: AC | PRN

## 2011-09-22 NOTE — Progress Notes (Signed)
Patient ID: [name], female   DOB: [birth year], 71 y.o.   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  History: Ms. Keniston returns for follow-up 2 years after cryoablation of a right renal carcinoma. She has had no abdominal complaints or urinary symptoms. She was short of breath today just prior to coming to the office and used an albuterol inhaler.  Review of Systems: No hematuria, dysuria, fever, chills or flank pain.  Exam: Vital signs: Blood pressure 164/53, pulse 132, temperature 97.8, oxygen saturation 82% on room air. Lungs: Excretory wheezing bilaterally, left greater than right. Abdomen: Soft and nontender. No flank tenderness. After placing the patient on 2 liters of oxygen by nasal cannula and having the patient re-use her albuterol inhaler, oxygen saturation increased to 100%. The patient also stated that she was less short of breath.  Labs: Creatinine 1.56 and estimated GFR 39 ml/minute.  Imaging: CT of the abdomen today demonstrates post ablation changes of the posterior right kidney which are less prominent compared to the prior scan on 07/20/2010. There is no evidence of residual or recurrent enhancing tumor.  Assessment and Plan: No evidence of recurrent right renal tumor 2 years after percutaneous cryoablation. I have recommended another follow-up scan in 1 year.

## 2011-09-22 NOTE — Progress Notes (Signed)
YEARLY F/U RENAL ABLATION.. NO SYMPTOMS TO COMPLAIN ABOUT. PT DOES HAVE TROUBLE BREATHING, O2 SAT WERE 82 ON ROOM AIR TODAY, PT DID STATE THAT SHE TOOK A PUFF ON INHALER ABOUT 10 MIN. AGO.- PLACED PT. ON 2L OF O2. PER DR YAMAGATA'S REQUEST

## 2011-09-29 ENCOUNTER — Other Ambulatory Visit (INDEPENDENT_AMBULATORY_CARE_PROVIDER_SITE_OTHER): Payer: Medicare Other

## 2011-09-29 ENCOUNTER — Ambulatory Visit (INDEPENDENT_AMBULATORY_CARE_PROVIDER_SITE_OTHER)
Admission: RE | Admit: 2011-09-29 | Discharge: 2011-09-29 | Disposition: A | Discharge status: Home / Self Care | Payer: Medicare Other | Source: Ambulatory Visit | Attending: Adult Health | Admitting: Adult Health

## 2011-09-29 ENCOUNTER — Ambulatory Visit (INDEPENDENT_AMBULATORY_CARE_PROVIDER_SITE_OTHER): Payer: Medicare Other | Admitting: Adult Health

## 2011-09-29 ENCOUNTER — Encounter: Payer: Self-pay | Admitting: Adult Health

## 2011-09-29 VITALS — BP 122/80 | HR 70 | Temp 97.3°F | Ht 64.0 in | Wt 239.4 lb

## 2011-09-29 DIAGNOSIS — R0989 Other specified symptoms and signs involving the circulatory and respiratory systems: Secondary | ICD-10-CM

## 2011-09-29 DIAGNOSIS — J4530 Mild persistent asthma, uncomplicated: Secondary | ICD-10-CM

## 2011-09-29 DIAGNOSIS — J45909 Unspecified asthma, uncomplicated: Secondary | ICD-10-CM

## 2011-09-29 DIAGNOSIS — R0600 Dyspnea, unspecified: Secondary | ICD-10-CM

## 2011-09-29 NOTE — Progress Notes (Signed)
Reviewed and agree with assessment/plan. 

## 2011-09-29 NOTE — Patient Instructions (Addendum)
I will call with xray and lab results.  Prednisone taper over next week.  Extra lasix 40mg  x 2 days  Mucinex DM Twice daily  As needed   follow up Dr. Halford Chessman  In 1-2 weeks  Low salt diet .  Call if sugars are >250  Please contact office for sooner follow up if symptoms do not improve or worsen or seek emergency care

## 2011-09-29 NOTE — Assessment & Plan Note (Addendum)
Asthma flare  ? Volume overload, check xray and bnp xopenex neb in office  Short steroid taper as she is DM, advised to watch sugars closely , call if >250   Plan Prednisone taper over next week.  Extra lasix 40mg  x 2 days  Mucinex DM Twice daily  As needed   follow up Dr. Halford Chessman  In 1-2 weeks  Low salt diet .  Call if sugars are >250  Please contact office for sooner follow up if symptoms do not improve or worsen or seek emergency care

## 2011-09-29 NOTE — Progress Notes (Signed)
  Subjective:    Patient ID: [name], female    DOB: 1940/08/07, 71 y.o.   MRN: [id number]  HPI 71 yo female, former smoker,  with known hx of asthma and allergic rhinitis   09/29/2011 Acute Ov  Complains of increased SOB, wheezing, some tightness in chest, dry cough x3 weeks.  INcreased lower extremity swelling despite lasix 80mg  daily  No OTC used  Increased SABA use.  Taking Symbicort Twice daily  , no missed doses.  No discolored mucus or fever. No chest pain .  No calf pain or redness.  Wt is up >12 lbs     Review of Systems Constitutional:   No  weight loss, night sweats,  Fevers, chills, + fatigue, or  lassitude.  HEENT:   No headaches,  Difficulty swallowing,  Tooth/dental problems, or  Sore throat,                No sneezing, itching, ear ache,  +nasal congestion, post nasal drip,   CV:  No chest pain,  + Orthopnea, PND, swelling in lower extremities,  NO anasarca, dizziness, palpitations, syncope.   GI  No heartburn, indigestion, abdominal pain, nausea, vomiting, diarrhea, change in bowel habits, loss of appetite, bloody stools.   Resp:    No coughing up of blood.     No chest wall deformity  Skin: no rash or lesions.  GU: no dysuria, change in color of urine, no urgency or frequency.  No flank pain, no hematuria   MS:  No joint pain or swelling.  No decreased range of motion.  No back pain.  Psych:  No change in mood or affect. No depression or anxiety.  No memory loss.         Objective:   Physical Exam GEN: A/Ox3; pleasant , NAD, obese   HEENT:  [place]/AT,  EACs-clear, TMs-wnl, NOSE-clear, THROAT-clear, no lesions, no postnasal drip or exudate noted.   NECK:  Supple w/ fair ROM; no JVD; normal carotid impulses w/o bruits; no thyromegaly or nodules palpated; no lymphadenopathy.  RESP  Coarse BS w/ exp wheeze no accessory muscle use, no dullness to percussion  CARD:  RRR, no m/r/g  , 2+ peripheral edema, pulses intact, no cyanosis or clubbing. Neg  homans sign , venous insufficiency/stasis dermatatic changes   GI:   Soft & nt; nml bowel sounds; no organomegaly or masses detected.  Musco: Warm bil, no deformities or joint swelling noted.   Neuro: alert, no focal deficits noted.    Skin: Warm, no lesions or rashes         Assessment & Plan:

## 2011-09-30 LAB — BASIC METABOLIC PANEL
BUN: 49 mg/dL — ABNORMAL HIGH (ref 6–23)
Calcium: 8.8 mg/dL (ref 8.4–10.5)
Creatinine, Ser: 1.5 mg/dL — ABNORMAL HIGH (ref 0.4–1.2)
GFR: 44.16 mL/min — ABNORMAL LOW (ref >60.00)
Potassium: 4.3 mEq/L (ref 3.5–5.1)

## 2011-09-30 LAB — BRAIN NATRIURETIC PEPTIDE: Pro B Natriuretic peptide (BNP): 40 pg/mL (ref 0.0–100.0)

## 2011-09-30 NOTE — Progress Notes (Signed)
Quick Note:  LMOM TCB x1. ______ 

## 2011-09-30 NOTE — Progress Notes (Signed)
Quick Note:  Called spoke with patient, advised of cxr results / recs as stated by TP. Pt verbalized her understanding and denied any questions. ______ 

## 2011-09-30 NOTE — Progress Notes (Signed)
Quick Note:  Called spoke with patient, advised of lab results / recs as stated by TP. Pt verbalized her understanding and denied any questions. ______ 

## 2011-10-13 ENCOUNTER — Ambulatory Visit (INDEPENDENT_AMBULATORY_CARE_PROVIDER_SITE_OTHER): Payer: Medicare Other | Admitting: Pulmonary Disease

## 2011-10-13 ENCOUNTER — Encounter: Payer: Self-pay | Admitting: Pulmonary Disease

## 2011-10-13 VITALS — BP 138/66 | HR 65 | Temp 98.2°F | Ht 64.0 in | Wt 246.2 lb

## 2011-10-13 DIAGNOSIS — R490 Dysphonia: Secondary | ICD-10-CM

## 2011-10-13 DIAGNOSIS — J45909 Unspecified asthma, uncomplicated: Secondary | ICD-10-CM

## 2011-10-13 DIAGNOSIS — R05 Cough: Secondary | ICD-10-CM

## 2011-10-13 DIAGNOSIS — J4530 Mild persistent asthma, uncomplicated: Secondary | ICD-10-CM

## 2011-10-13 MED ORDER — ALBUTEROL SULFATE (2.5 MG/3ML) 0.083% IN NEBU: 2.5000 mg | INHALATION_SOLUTION | Freq: Four times a day (QID) | RESPIRATORY_TRACT | Status: DC

## 2011-10-13 MED ORDER — BUDESONIDE 0.25 MG/2ML IN SUSP: 0.2500 mg | Freq: Every day | RESPIRATORY_TRACT | Status: DC

## 2011-10-13 NOTE — Assessment & Plan Note (Signed)
I think much of her symptoms are related to upper airway irritation.  Will refer to ENT for further assessment.

## 2011-10-13 NOTE — Assessment & Plan Note (Signed)
Will stop symbicort.  Will change to nebulized albuterol and pulmicort.  She can continue singulair.

## 2011-10-13 NOTE — Assessment & Plan Note (Signed)
Advised she can use flonase twice per day.

## 2011-10-13 NOTE — Patient Instructions (Signed)
Flu shot today Stop symbicort Albuterol one vial nebulized twice per day before pulmicort, and as needed for cough, wheeze or chest congestion Pulmicort one vial nebulized twice per day Proair as needed for cough, wheeze or chest congestion Can use flonase twice per day Continue singulair 10 mg nightly Will arrange for ENT evaluation Follow up in 2 months

## 2011-10-13 NOTE — Progress Notes (Signed)
Chief Complaint  Patient presents with  . Follow-up    Pt c/o increased SOB, wheezing and dry cough worsening. Pt denies chest tightness/pain. Pt is very concerned of weight again since July...212lb up to 246.2lb    History of Present Illness: [name] is a 71 y.o. female with remote history of smoking and with dyspnea, chronic cough, and hoarseness.  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  She was given neb treatment and prednisone last month.  This helped some.  She feels nebulizer medicine works better for her.   She continues to have cough, but no sputum.  She gets wheeze and hoarseness from her throat.  She has sinus congestion with post-nasal drip.  She is using her proair 3 times per day.  Tests: CXR 09/30/11>>Moderate cardiac silhouette enlargement. Mild vascular congestion pattern. No consolidation or pleural effusion evident.    Past Medical History  Diagnosis Date  . Arthritis   . Lumbar disc disease   . Asthma   . Depression   . Diabetes mellitus   . GERD (gastroesophageal reflux disease)   . Hypercholesterolemia   . HTN (hypertension)   . Peptic ulcer disease   . Valgus foot     right   . Gastric ulcer   . Adenomatous colon polyp   . Diverticulosis   . Internal hemorrhoids   . 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 Anemia   . Umbilical hernia     Past Surgical History  Procedure Date  . Total knee arthroplasty     Bilateral  . Incise and drain abcess     Abdominal Wall  . Umbilical hernia repair [id number]    incarcerated  . Lipoma resection     Right Neck  . Colonoscopy 2209 or 2010     2001, 2005, 2009  colonoscopy.  [place]  . Upper gastrointestinal endoscopy 2001    medoff Grade A esophagitis    No Known Allergies  Physical Exam:  Blood pressure 138/66, pulse 65, temperature 98.2 F (36.8 C), temperature source Oral, height 5\' 4"  (1.626 m), weight 246 lb 3.2 oz (111.676 kg), SpO2 99.00%.  Body mass index is 42.26 kg/(m^2).  Wt  Readings from Last 2 Encounters:  10/13/11 246 lb 3.2 oz (111.676 kg)  09/29/11 239 lb 6.4 oz (108.591 kg)    General - No distress, obese  HEENT - Left eye pstosis, wears glasses, EOMI, no sinus tenderness, clear nasal discharge, no oral exudate, no LAN  Cardiac - s1s2 regular, no murmur  Chest - prolonged exhalation, faint expiratory wheeze clear with coughing mostly from upper airway Abdomen - obese, soft, nontender, + bowel sounds  Extremities - 1+ leg edema, valgus deformities, Rt foot in brace  Neurologic - normal strength, difficulty with recall memory  Skin - chronic venous stasis changes  Psychiatric - pleasant, normal mood/behavior    Assessment/Plan:  Outpatient Encounter Prescriptions as of 10/13/2011  Medication Sig Dispense Refill  . albuterol (PROAIR HFA) 108 (90 BASE) MCG/ACT inhaler Inhale 2 puffs into the lungs every 6 (six) hours as needed for wheezing.      Marland Kitchen amLODipine (NORVASC) 5 MG tablet Take 10 mg by mouth daily.       . Ascorbic Acid (VITAMIN C) 1000 MG tablet Take 1,000 mg by mouth daily.      Marland Kitchen augmented betamethasone dipropionate (DIPROLENE-AF) 0.05 % cream Apply 1 application topically 2 (two) times daily. Apply to left leg      .  budesonide-formoterol (SYMBICORT) 160-4.5 MCG/ACT inhaler Inhale 2 puffs into the lungs 2 (two) times daily.  1 Inhaler  12  . Calcium Carbonate-Vitamin D (CALTRATE 600+D) 600-400 MG-UNIT per tablet Take 1 tablet by mouth daily.      . ergocalciferol (VITAMIN D2) 50000 UNITS capsule Take 50,000 Units by mouth once a week.      . esomeprazole (NEXIUM) 40 MG capsule Take 40 mg by mouth daily before breakfast.       . ferrous sulfate 325 (65 FE) MG tablet Take 325 mg by mouth daily with breakfast.      . fluticasone (FLONASE) 50 MCG/ACT nasal spray Place 2 sprays into the nose daily.  16 g  2  . folic acid (FOLVITE) [id number] MCG tablet 1-2 per week      . furosemide (LASIX) 40 MG tablet Take 80 mg by mouth daily.       Marland Kitchen gabapentin  (NEURONTIN) 100 MG capsule Take 100 mg by mouth 3 (three) times daily.      Marland Kitchen galantamine (RAZADYNE) 8 MG tablet Take 8 mg by mouth 2 (two) times daily.      Marland Kitchen glipiZIDE (GLUCOTROL XL) 10 MG 24 hr tablet Take 10 mg by mouth 2 (two) times daily.      Marland Kitchen HUMALOG MIX 75/25 KWIKPEN (75-25) 100 UNIT/ML SUSP Inject 15 Units into the skin every morning.      . hyoscyamine (ANASPAZ) 0.125 MG TBDP Place 0.125 mg under the tongue every 8 (eight) hours as needed.      . insulin glargine (LANTUS) 100 UNIT/ML injection Inject 35 Units into the skin daily.      . Magnesium 250 MG TABS Take 1 tablet by mouth daily.      . montelukast (SINGULAIR) 10 MG tablet Take 1 tablet (10 mg total) by mouth daily.  30 tablet  5  . Omega-3 Fatty Acids (FISH OIL) 1000 MG CAPS Take 1 capsule by mouth daily.      . ondansetron (ZOFRAN) 4 MG tablet Take 4 mg by mouth every 4 (four) hours as needed. For nausea      . polyethylene glycol (MIRALAX / GLYCOLAX) packet Take 17 g by mouth daily.      . potassium chloride SA (K-DUR,KLOR-CON) 20 MEQ tablet Take 20 mEq by mouth daily.      Marland Kitchen pyridOXINE (VITAMIN B-6) 100 MG tablet Take 100 mg by mouth daily.      . simvastatin (ZOCOR) 20 MG tablet Take 20 mg by mouth every evening.      Marland Kitchen Spacer/Aero-Holding Chambers (AEROCHAMBER MV) inhaler Use as instructed  1 each  0  . VICODIN ES 7.5-300 MG TABS Take 1 tablet by mouth Twice daily as needed.      . vitamin B-12 (CYANOCOBALAMIN) 1000 MCG tablet Take 1,000 mcg by mouth daily.      . vitamin E 400 UNIT capsule Take 400 Units by mouth daily.        Cirilo Canner Pager:  [phone] 10/13/2011, 9:29 AM

## 2011-10-19 ENCOUNTER — Encounter: Payer: Self-pay | Admitting: Pulmonary Disease

## 2011-11-15 ENCOUNTER — Encounter: Payer: Self-pay | Admitting: Pulmonary Disease

## 2011-11-15 ENCOUNTER — Ambulatory Visit (INDEPENDENT_AMBULATORY_CARE_PROVIDER_SITE_OTHER): Payer: Medicare Other | Admitting: Pulmonary Disease

## 2011-11-15 VITALS — BP 138/70 | HR 75 | Temp 98.2°F | Ht 64.0 in | Wt 247.0 lb

## 2011-11-15 DIAGNOSIS — R490 Dysphonia: Secondary | ICD-10-CM

## 2011-11-15 DIAGNOSIS — J45909 Unspecified asthma, uncomplicated: Secondary | ICD-10-CM

## 2011-11-15 DIAGNOSIS — J4530 Mild persistent asthma, uncomplicated: Secondary | ICD-10-CM

## 2011-11-15 MED ORDER — BUDESONIDE 0.25 MG/2ML IN SUSP: 0.2500 mg | Freq: Two times a day (BID) | RESPIRATORY_TRACT | Status: DC

## 2011-11-15 NOTE — Progress Notes (Signed)
Chief Complaint  Patient presents with  . Follow-up    c/o bilateral hand/feet swelling, breathing unchanged, wheezing, dry cough. no chest tx    History of Present Illness: [name] is a 71 y.o. female former smoker with chronic cough from asthma, and allergic rhinitis with post-nasal drip.  Her hoarseness has improved since switching from symbicort to nebulized pulmicort.  She does not need to use albuterol as much.  Her sinuses are doing okay.  She gets occasional cough and wheeze, but not as bad as before.  She was seen by ENT in October, and evaluation was negative.  She was scheduled for PFT's last April, but was not able to get these done until now.  Tests: RAST 04/20/11>>IgE 63.2, dust mites, cat/dog dander, elm, plantain CXR 09/30/11>>Moderate cardiac silhouette enlargement. Mild vascular congestion pattern. No consolidation or pleural effusion evident.  10/18/11 ENT evaluation negative Ruby Cola)  Past Medical History  Diagnosis Date  . Arthritis   . Lumbar disc disease   . Asthma   . Depression   . Diabetes mellitus   . GERD (gastroesophageal reflux disease)   . Hypercholesterolemia   . HTN (hypertension)   . Peptic ulcer disease   . Valgus foot     right   . Gastric ulcer   . Adenomatous colon polyp   . Diverticulosis   . Internal hemorrhoids   . 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 Anemia   . Umbilical hernia     Past Surgical History  Procedure Date  . Total knee arthroplasty     Bilateral  . Incise and drain abcess     Abdominal Wall  . Umbilical hernia repair [id number]    incarcerated  . Lipoma resection     Right Neck  . Colonoscopy 2209 or 2010     2001, 2005, 2009  colonoscopy.  [place]  . Upper gastrointestinal endoscopy 2001    medoff Grade A esophagitis    Current Outpatient Prescriptions on File Prior to Visit  Medication Sig Dispense Refill  . albuterol (PROAIR HFA) 108 (90 BASE) MCG/ACT inhaler Inhale 2 puffs into the  lungs every 6 (six) hours as needed for wheezing.      Marland Kitchen albuterol (PROVENTIL) (2.5 MG/3ML) 0.083% nebulizer solution Take 3 mLs (2.5 mg total) by nebulization 4 (four) times daily.  75 mL  12  . amLODipine (NORVASC) 5 MG tablet Take 10 mg by mouth daily.       . Ascorbic Acid (VITAMIN C) 1000 MG tablet Take 1,000 mg by mouth daily.      Marland Kitchen augmented betamethasone dipropionate (DIPROLENE-AF) 0.05 % cream Apply 1 application topically 2 (two) times daily. Apply to left leg      . Calcium Carbonate-Vitamin D (CALTRATE 600+D) 600-400 MG-UNIT per tablet Take 1 tablet by mouth daily.      . ergocalciferol (VITAMIN D2) 50000 UNITS capsule Take 50,000 Units by mouth once a week.      . esomeprazole (NEXIUM) 40 MG capsule Take 40 mg by mouth daily before breakfast.       . ferrous sulfate 325 (65 FE) MG tablet Take 325 mg by mouth daily with breakfast.      . fluticasone (FLONASE) 50 MCG/ACT nasal spray Place 2 sprays into the nose daily.  16 g  2  . folic acid (FOLVITE) Q000111Q MCG tablet 1-2 per week      . furosemide (LASIX) 40 MG tablet Take 80 mg by  mouth daily.       Marland Kitchen gabapentin (NEURONTIN) 100 MG capsule Take 100 mg by mouth 3 (three) times daily.      Marland Kitchen galantamine (RAZADYNE) 8 MG tablet Take 8 mg by mouth 2 (two) times daily.      Marland Kitchen glipiZIDE (GLUCOTROL XL) 10 MG 24 hr tablet Take 10 mg by mouth 2 (two) times daily.      Marland Kitchen HUMALOG MIX 75/25 KWIKPEN (75-25) 100 UNIT/ML SUSP Inject 15 Units into the skin every morning.      . hyoscyamine (ANASPAZ) 0.125 MG TBDP Place 0.125 mg under the tongue every 8 (eight) hours as needed.      . insulin glargine (LANTUS) 100 UNIT/ML injection Inject 35 Units into the skin daily.      . Magnesium 250 MG TABS Take 1 tablet by mouth daily.      . montelukast (SINGULAIR) 10 MG tablet Take 1 tablet (10 mg total) by mouth daily.  30 tablet  5  . Omega-3 Fatty Acids (FISH OIL) 1000 MG CAPS Take 1 capsule by mouth daily.      . ondansetron (ZOFRAN) 4 MG tablet Take 4 mg  by mouth every 4 (four) hours as needed. For nausea      . polyethylene glycol (MIRALAX / GLYCOLAX) packet Take 17 g by mouth daily.      . potassium chloride SA (K-DUR,KLOR-CON) 20 MEQ tablet Take 20 mEq by mouth daily.      Marland Kitchen pyridOXINE (VITAMIN B-6) 100 MG tablet Take 100 mg by mouth daily.      . simvastatin (ZOCOR) 20 MG tablet Take 20 mg by mouth every evening.      Marland Kitchen VICODIN ES 7.5-300 MG TABS Take 1 tablet by mouth Twice daily as needed.      . vitamin B-12 (CYANOCOBALAMIN) 1000 MCG tablet Take 1,000 mcg by mouth daily.      . vitamin E 400 UNIT capsule Take 400 Units by mouth daily.      . [DISCONTINUED] budesonide (PULMICORT) 0.25 MG/2ML nebulizer solution Take 2 mLs (0.25 mg total) by nebulization daily.  60 mL  12  . Spacer/Aero-Holding Chambers (AEROCHAMBER MV) inhaler Use as instructed  1 each  0    No Known Allergies   Physical Exam: Filed Vitals:   11/15/11 1620 11/15/11 1621  BP:  138/70  Pulse:  75  Temp: 98.2 F (36.8 C)   TempSrc: Oral   Height: 5\' 4"  (1.626 m)   Weight: 247 lb (112.038 kg)   SpO2:  100%    Body mass index is 42.40 kg/(m^2).   Wt Readings from Last 2 Encounters:  11/15/11 247 lb (112.038 kg)  10/13/11 246 lb 3.2 oz (111.676 kg)    General - No distress, obese  HEENT - Left eye pstosis, wears glasses, no sinus tenderness, clear nasal discharge, no oral exudate, no LAN  Cardiac - s1s2 regular, no murmur  Chest - prolonged exhalation, no wheeze/rales Abdomen - Soft, nontender Extremities - 1+ leg edema, valgus deformities, Rt foot in brace  Neurologic - normal strength, difficulty with recall memory  Skin - chronic venous stasis changes  Psychiatric - pleasant, normal mood/behavior    Assessment/Plan:  Chesley Mires, MD [place] 11/15/2011, 4:29 PM Pager:  [phone] After 3pm call: [phone]

## 2011-11-15 NOTE — Assessment & Plan Note (Signed)
She had normal ENT evaluation by Dr. Ruby Cola 10/18/11.  Improved after changing from symbicort to nebulized pulmicort.

## 2011-11-15 NOTE — Assessment & Plan Note (Signed)
Improved on current regimen.  She is to continue nebulized pulmicort bid, singulair qhs, and prn albuterol.

## 2011-11-15 NOTE — Patient Instructions (Signed)
Will arrange for breathing test (PFT) and call with results Follow up in 6 months

## 2011-11-15 NOTE — Assessment & Plan Note (Signed)
Stable.  Had negative ENT evaluation.  She can use flonase as needed.

## 2011-11-29 ENCOUNTER — Ambulatory Visit (INDEPENDENT_AMBULATORY_CARE_PROVIDER_SITE_OTHER): Payer: Medicare Other | Admitting: Pulmonary Disease

## 2011-11-29 DIAGNOSIS — J4530 Mild persistent asthma, uncomplicated: Secondary | ICD-10-CM

## 2011-11-29 DIAGNOSIS — J45909 Unspecified asthma, uncomplicated: Secondary | ICD-10-CM

## 2011-11-29 LAB — PULMONARY FUNCTION TEST

## 2011-11-29 NOTE — Progress Notes (Signed)
PFT done today. 

## 2011-12-02 ENCOUNTER — Encounter: Payer: Self-pay | Admitting: Pulmonary Disease

## 2011-12-02 ENCOUNTER — Telehealth: Payer: Self-pay | Admitting: Pulmonary Disease

## 2011-12-02 NOTE — Telephone Encounter (Signed)
PFT 11/29/11>>FEV1 0.97 (48%), FEV1% 67, TLC 2.82 (59%), DLCO 46%, +BD from FEF 25-75%.  Left message detailing that PFT shows changes of chronic asthma with history of smoking.  No change to current treatment regimen.  Advised pt to call back with questions.

## 2011-12-05 ENCOUNTER — Other Ambulatory Visit: Payer: Self-pay | Admitting: Pulmonary Disease

## 2011-12-16 ENCOUNTER — Emergency Department (HOSPITAL_COMMUNITY): Payer: Medicare Other

## 2011-12-16 ENCOUNTER — Encounter (HOSPITAL_COMMUNITY): Payer: Self-pay | Admitting: Family Medicine

## 2011-12-16 ENCOUNTER — Emergency Department (HOSPITAL_COMMUNITY)
Admission: EM | Admit: 2011-12-16 | Discharge: 2011-12-16 | Disposition: A | Discharge status: Home / Self Care | Payer: Medicare Other | Attending: Emergency Medicine | Admitting: Emergency Medicine

## 2011-12-16 DIAGNOSIS — F329 Major depressive disorder, single episode, unspecified: Secondary | ICD-10-CM | POA: Insufficient documentation

## 2011-12-16 DIAGNOSIS — M79604 Pain in right leg: Secondary | ICD-10-CM

## 2011-12-16 DIAGNOSIS — IMO0002 Reserved for concepts with insufficient information to code with codable children: Secondary | ICD-10-CM | POA: Insufficient documentation

## 2011-12-16 DIAGNOSIS — M7989 Other specified soft tissue disorders: Secondary | ICD-10-CM

## 2011-12-16 DIAGNOSIS — E7800 Pure hypercholesterolemia, unspecified: Secondary | ICD-10-CM | POA: Insufficient documentation

## 2011-12-16 DIAGNOSIS — J449 Chronic obstructive pulmonary disease, unspecified: Secondary | ICD-10-CM | POA: Insufficient documentation

## 2011-12-16 DIAGNOSIS — M79609 Pain in unspecified limb: Secondary | ICD-10-CM

## 2011-12-16 DIAGNOSIS — Z794 Long term (current) use of insulin: Secondary | ICD-10-CM | POA: Insufficient documentation

## 2011-12-16 DIAGNOSIS — M129 Arthropathy, unspecified: Secondary | ICD-10-CM | POA: Insufficient documentation

## 2011-12-16 DIAGNOSIS — Z8711 Personal history of peptic ulcer disease: Secondary | ICD-10-CM | POA: Insufficient documentation

## 2011-12-16 DIAGNOSIS — M216X9 Other acquired deformities of unspecified foot: Secondary | ICD-10-CM | POA: Insufficient documentation

## 2011-12-16 DIAGNOSIS — J4489 Other specified chronic obstructive pulmonary disease: Secondary | ICD-10-CM | POA: Insufficient documentation

## 2011-12-16 DIAGNOSIS — E119 Type 2 diabetes mellitus without complications: Secondary | ICD-10-CM | POA: Insufficient documentation

## 2011-12-16 DIAGNOSIS — F3289 Other specified depressive episodes: Secondary | ICD-10-CM | POA: Insufficient documentation

## 2011-12-16 DIAGNOSIS — I10 Essential (primary) hypertension: Secondary | ICD-10-CM | POA: Insufficient documentation

## 2011-12-16 DIAGNOSIS — R0602 Shortness of breath: Secondary | ICD-10-CM | POA: Insufficient documentation

## 2011-12-16 DIAGNOSIS — R609 Edema, unspecified: Secondary | ICD-10-CM | POA: Insufficient documentation

## 2011-12-16 DIAGNOSIS — D649 Anemia, unspecified: Secondary | ICD-10-CM | POA: Insufficient documentation

## 2011-12-16 DIAGNOSIS — Z87891 Personal history of nicotine dependence: Secondary | ICD-10-CM | POA: Insufficient documentation

## 2011-12-16 DIAGNOSIS — K319 Disease of stomach and duodenum, unspecified: Secondary | ICD-10-CM | POA: Insufficient documentation

## 2011-12-16 DIAGNOSIS — K219 Gastro-esophageal reflux disease without esophagitis: Secondary | ICD-10-CM | POA: Insufficient documentation

## 2011-12-16 DIAGNOSIS — K5730 Diverticulosis of large intestine without perforation or abscess without bleeding: Secondary | ICD-10-CM | POA: Insufficient documentation

## 2011-12-16 DIAGNOSIS — Z79899 Other long term (current) drug therapy: Secondary | ICD-10-CM | POA: Insufficient documentation

## 2011-12-16 LAB — CBC WITH DIFFERENTIAL/PLATELET
Basophils Relative: 0 % (ref 0–1)
Eosinophils Absolute: 0.3 10*3/uL (ref 0.0–0.7)
HCT: 28.5 % — ABNORMAL LOW (ref 36.0–46.0)
Hemoglobin: 9.4 g/dL — ABNORMAL LOW (ref 12.0–15.0)
MCH: 28.2 pg (ref 26.0–34.0)
MCHC: 33 g/dL (ref 30.0–36.0)
MCV: 85.6 fL (ref 78.0–100.0)
Monocytes Absolute: 0.4 10*3/uL (ref 0.1–1.0)
Monocytes Relative: 5 % (ref 3–12)

## 2011-12-16 LAB — URINALYSIS, ROUTINE W REFLEX MICROSCOPIC
Bilirubin Urine: NEGATIVE
Hgb urine dipstick: NEGATIVE
Ketones, ur: NEGATIVE mg/dL
Nitrite: NEGATIVE
Protein, ur: NEGATIVE mg/dL
Specific Gravity, Urine: 1.01 (ref 1.005–1.030)
Urobilinogen, UA: 0.2 mg/dL (ref 0.0–1.0)

## 2011-12-16 LAB — TROPONIN I: Troponin I: <0.3 ng/mL (ref <0.30)

## 2011-12-16 LAB — COMPREHENSIVE METABOLIC PANEL
Albumin: 3.5 g/dL (ref 3.5–5.2)
BUN: 57 mg/dL — ABNORMAL HIGH (ref 6–23)
Creatinine, Ser: 1.74 mg/dL — ABNORMAL HIGH (ref 0.50–1.10)
GFR calc Af Amer: 33 mL/min — ABNORMAL LOW (ref >90)
Total Bilirubin: 0.3 mg/dL (ref 0.3–1.2)
Total Protein: 7.5 g/dL (ref 6.0–8.3)

## 2011-12-16 LAB — PRO B NATRIURETIC PEPTIDE: Pro B Natriuretic peptide (BNP): 242.3 pg/mL — ABNORMAL HIGH (ref 0–125)

## 2011-12-16 MED ORDER — IPRATROPIUM BROMIDE 0.02 % IN SOLN
0.5000 mg | Freq: Once | RESPIRATORY_TRACT | Status: AC
  Administered 2011-12-16: 0.5 mg via RESPIRATORY_TRACT
  Filled 2011-12-16: qty 2.5

## 2011-12-16 MED ORDER — ONDANSETRON HCL 4 MG/2ML IJ SOLN
4.0000 mg | Freq: Once | INTRAMUSCULAR | Status: AC
  Administered 2011-12-16: 4 mg via INTRAVENOUS
  Filled 2011-12-16: qty 2

## 2011-12-16 MED ORDER — ALBUTEROL SULFATE (5 MG/ML) 0.5% IN NEBU
5.0000 mg | INHALATION_SOLUTION | Freq: Once | RESPIRATORY_TRACT | Status: AC
  Administered 2011-12-16: 5 mg via RESPIRATORY_TRACT
  Filled 2011-12-16: qty 1

## 2011-12-16 MED ORDER — IOHEXOL 350 MG/ML SOLN
80.0000 mL | Freq: Once | INTRAVENOUS | Status: AC | PRN
  Administered 2011-12-16: 80 mL via INTRAVENOUS

## 2011-12-16 MED ORDER — MORPHINE SULFATE 4 MG/ML IJ SOLN
4.0000 mg | Freq: Once | INTRAMUSCULAR | Status: AC
Start: 2011-12-16 — End: 2011-12-16
  Administered 2011-12-16: 4 mg via INTRAVENOUS
  Filled 2011-12-16: qty 1

## 2011-12-16 NOTE — ED Provider Notes (Signed)
History     CSN: [id number]  Arrival date & time 12/16/11  1502   First MD Initiated Contact with Patient 12/16/11 1524      Chief Complaint  Patient presents with  . Leg Pain    (Consider location/radiation/quality/duration/timing/severity/associated sxs/prior treatment) HPI Comments: Patient complains of right leg pain for the past 2 days. She was Applying brakes the car and felt a pulling sensation in her right posterior thigh that radiates down the right leg. Her entire leg feels heavy and she is having difficulty moving it.  Has never had pain like this in the past, denies falls.  Has history of bulging disks in back but states that is not bothering her now. Has urine incontinence at baseline.  Hx valgus R foot with brace.  C/o SOB but has history of asthma, denies chest pain. No cough, fever, or vomiting.  Patient appears short of breath but states her breathing is no worse than baseline.  Much difficulty transferring from wheelchair to bed which she states she is normally able to do.  The history is provided by the patient.    Past Medical History  Diagnosis Date  . Arthritis   . Lumbar disc disease   . Asthma   . Depression   . Diabetes mellitus   . GERD (gastroesophageal reflux disease)   . Hypercholesterolemia   . HTN (hypertension)   . Peptic ulcer disease   . Valgus foot     right   . Gastric ulcer   . Adenomatous colon polyp   . Diverticulosis   . Internal hemorrhoids   . 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 Anemia   . Umbilical hernia   . 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 Total knee arthroplasty     Bilateral  . Incise and drain abcess     Abdominal Wall  . Umbilical hernia repair [id number]    incarcerated  . Lipoma resection     Right Neck  . Colonoscopy 2209 or 2010     2001, 2005, 2009  colonoscopy.  [place]  . Upper gastrointestinal endoscopy 2001    medoff Grade A esophagitis    Family History   Problem Relation Age of Onset  . Breast cancer Maternal Aunt     History  Substance Use Topics  . Smoking status: Former Smoker -- 2.0 packs/day for 5 years    Types: Cigarettes    Quit date: 01/10/1958  . Smokeless tobacco: Never Used  . Alcohol Use: No     Comment: Quit when she quit smoking    OB History    Grav Para Term Preterm Abortions TAB SAB Ect Mult Living                  Review of Systems  Constitutional: Positive for activity change. Negative for fever.  HENT: Negative for congestion and rhinorrhea.   Respiratory: Positive for shortness of breath. Negative for chest tightness.   Gastrointestinal: Negative for nausea, vomiting and abdominal pain.  Genitourinary: Negative for dysuria and hematuria.  Musculoskeletal: Positive for myalgias, back pain, arthralgias and gait problem.  Skin: Negative for rash.  Neurological: Positive for weakness.    Allergies  Review of patient's allergies indicates no known allergies.  Home Medications   Current Outpatient Rx  Name  Route  Sig  Dispense  Refill  . ALBUTEROL SULFATE HFA 108 (90 BASE) MCG/ACT IN AERS  Inhalation   Inhale 2 puffs into the lungs every 6 (six) hours as needed for wheezing.         . ALBUTEROL SULFATE (2.5 MG/3ML) 0.083% IN NEBU   Nebulization   Take 3 mLs (2.5 mg total) by nebulization 4 (four) times daily.   75 mL   12   . AMLODIPINE BESYLATE 5 MG PO TABS   Oral   Take 10 mg by mouth daily.          Marland Kitchen VITAMIN C 1000 MG PO TABS   Oral   Take 1,000 mg by mouth daily.         Marland Kitchen BETAMETHASONE DIPROPIONATE AUG 0.05 % EX CREA   Topical   Apply 1 application topically 2 (two) times daily. Apply to left leg         . BUDESONIDE 0.25 MG/2ML IN SUSP   Nebulization   Take 2 mLs (0.25 mg total) by nebulization 2 (two) times daily.   60 mL   12   . CALCIUM CARBONATE-VITAMIN D 600-400 MG-UNIT PO TABS   Oral   Take 1 tablet by mouth daily.         . ERGOCALCIFEROL 50000 UNITS PO  CAPS   Oral   Take 50,000 Units by mouth once a week.         . ESOMEPRAZOLE MAGNESIUM 40 MG PO CPDR   Oral   Take 40 mg by mouth daily before breakfast.          . FERROUS SULFATE 325 (65 FE) MG PO TABS   Oral   Take 325 mg by mouth daily with breakfast.         . FLUTICASONE PROPIONATE 50 MCG/ACT NA SUSP   Nasal   Place 2 sprays into the nose daily.   16 g   2   . FOLIC ACID Q000111Q MCG PO TABS      1-2 per week         . FUROSEMIDE 40 MG PO TABS   Oral   Take 80 mg by mouth daily.          Marland Kitchen GABAPENTIN 100 MG PO CAPS   Oral   Take 100 mg by mouth daily.          Marland Kitchen GALANTAMINE HYDROBROMIDE 8 MG PO TABS   Oral   Take 8 mg by mouth 2 (two) times daily.         Marland Kitchen GLIPIZIDE ER 10 MG PO TB24   Oral   Take 10 mg by mouth 2 (two) times daily.         Marland Kitchen HUMALOG MIX 75/25 KWIKPEN (75-25) 100 UNIT/ML Daniel SUSP   Subcutaneous   Inject 15 Units into the skin every morning.         . HYOSCYAMINE SULFATE 0.125 MG PO TBDP   Sublingual   Place 0.125 mg under the tongue every 8 (eight) hours as needed.         . INSULIN GLARGINE 100 UNIT/ML Fidelity SOLN   Subcutaneous   Inject 35 Units into the skin daily.         Marland Kitchen MAGNESIUM 250 MG PO TABS   Oral   Take 1 tablet by mouth daily.         Marland Kitchen FISH OIL 1000 MG PO CAPS   Oral   Take 1 capsule by mouth daily.         Marland Kitchen ONDANSETRON HCL 4 MG PO TABS  Oral   Take 4 mg by mouth every 4 (four) hours as needed. For nausea         . POLYETHYLENE GLYCOL 3350 PO PACK   Oral   Take 17 g by mouth daily.         Marland Kitchen POTASSIUM CHLORIDE CRYS ER 20 MEQ PO TBCR   Oral   Take 20 mEq by mouth daily.         Marland Kitchen VITAMIN B-6 100 MG PO TABS   Oral   Take 100 mg by mouth daily.         . SERTRALINE HCL 50 MG PO TABS   Oral   Take 50 mg by mouth daily.         Marland Kitchen SIMVASTATIN 10 MG PO TABS   Oral   Take 10 mg by mouth at bedtime.         Marland Kitchen SINGULAIR 10 MG PO TABS      TAKE 1 TABLET (10 MG TOTAL) BY MOUTH  DAILY.   30 tablet   5   . VICODIN ES 7.5-300 MG PO TABS   Oral   Take 1 tablet by mouth Twice daily as needed.         Marland Kitchen VITAMIN B-12 1000 MCG PO TABS   Oral   Take 1,000 mcg by mouth daily.         Marland Kitchen VITAMIN E 400 UNITS PO CAPS   Oral   Take 400 Units by mouth daily.           BP 128/55  Pulse 78  Temp 98.5 F (36.9 C) (Oral)  Resp 18  SpO2 96%  Physical Exam  Constitutional: She is oriented to person, place, and time. She appears well-developed and well-nourished. No distress.       Morbidly obese  HENT:  Head: Normocephalic and atraumatic.  Mouth/Throat: No oropharyngeal exudate.  Eyes: Conjunctivae normal are normal. Pupils are equal, round, and reactive to light.  Neck: Normal range of motion. Neck supple.  Cardiovascular: Normal rate, regular rhythm and normal heart sounds.   No murmur heard. Pulmonary/Chest: Effort normal and breath sounds normal. No respiratory distress. She has no wheezes.  Abdominal: Soft. There is no tenderness. There is no rebound and no guarding.  Musculoskeletal: She exhibits edema.       +2 pedal edema bilaterally with venous insufficiency changes.  Difficult to palpate DP and PT pulse. Able to flex and extend R knee. C/o pain posteriorly.  No palpable cords.  FROM hip.  No midline lumbar pain.  Able to hold leg off bed against gravity but has significant pain.  Neurological: She is alert and oriented to person, place, and time. No cranial nerve deficit. She exhibits normal muscle tone. Coordination normal.  Skin: Skin is warm. No rash noted.    ED Course  Procedures (including critical care time)  Labs Reviewed  CBC WITH DIFFERENTIAL - Abnormal; Notable for the following:    RBC 3.33 (*)     Hemoglobin 9.4 (*)     HCT 28.5 (*)     All other components within normal limits  COMPREHENSIVE METABOLIC PANEL - Abnormal; Notable for the following:    BUN 57 (*)     Creatinine, Ser 1.74 (*)     GFR calc non Af Amer 29 (*)     GFR  calc Af Amer 33 (*)     All other components within normal limits  PRO B NATRIURETIC PEPTIDE -  Abnormal; Notable for the following:    Pro B Natriuretic peptide (BNP) 242.3 (*)     All other components within normal limits  D-DIMER, QUANTITATIVE - Abnormal; Notable for the following:    D-Dimer, Quant 1.76 (*)     All other components within normal limits  URINALYSIS, ROUTINE W REFLEX MICROSCOPIC  TROPONIN I   Dg Chest 2 View  12/16/2011  *RADIOLOGY REPORT*  Clinical Data: Shortness of breath  CHEST - 2 VIEW  Comparison: 09/29/2011  Findings: Study is limited by poor inspiration.  Cardiomegaly again noted.  No convincing pulmonary edema.  Bilateral basilar atelectasis.  No segmental infiltrate.  Mild degenerative changes thoracic spine.  IMPRESSION: Limited study by poor inspiration.  Cardiomegaly again noted. Bilateral basilar atelectasis.  No segmental infiltrate.   Original Report Authenticated By: Lahoma Crocker, M.D.    Dg Hip Complete Right  12/16/2011  *RADIOLOGY REPORT*  Clinical Data: Leg pain  RIGHT HIP - COMPLETE 2+ VIEW  Comparison: None.  Findings: Three views of the right hip submitted.  No acute fracture or subluxation.  No radiopaque foreign body.  IMPRESSION: No acute fracture or subluxation.  No radiopaque foreign body.   Original Report Authenticated By: Lahoma Crocker, M.D.    Ct Angio Chest Pe W/cm &/or Wo Cm  12/16/2011  *RADIOLOGY REPORT*  Clinical Data: Right leg pain.  Elevated D-dimer.  CT ANGIOGRAPHY CHEST  Technique:  Multidetector CT imaging of the chest using the standard protocol during bolus administration of intravenous contrast. Multiplanar reconstructed images including MIPs were obtained and reviewed to evaluate the vascular anatomy.  Contrast: 55mL OMNIPAQUE IOHEXOL 350 MG/ML SOLN  Comparison: Chest radiographs obtained today.  Findings: Normally opacified pulmonary arteries with no pulmonary arterial filling defects seen.  Mildly enlarged mediastinal and right hilar  lymph nodes.  These include a right paratracheal lymph node or a conglomeration of nodes with a short axis diameter of 1.4 cm on image number 20, a subcarinal node with a short axis diameter of the 1.6 cm on image number 34 and right hilar node with a short axis diameter of 1.1 cm on image number 41.  Mild patchy interstitial prominence in both lungs.  No lung masses are seen.  The heart is enlarged.  No pleural fluid.  The included portion of the upper abdomen is unremarkable.  Thoracic spine degenerative changes.  1.1 cm oval, smoothly marginated mass in the upper outer quadrant of the left breast on image number 35.  No significant finding was seen in the breasts on the screening mammogram dated 02/23/2011.  IMPRESSION:  1.  No pulmonary emboli. 2.  Probable mild interstitial pulmonary edema. 3.  Mild mediastinal and right hilar adenopathy. 4.  Probably benign mass in the upper outer quadrant of the left breast.  This will require correlation with the patient's previous mammograms to assess stability.   Original Report Authenticated By: Claudie Revering, M.D.    Mr Lumbar Spine Wo Contrast  12/16/2011  *RADIOLOGY REPORT*  Clinical Data: Low back left leg pain.  History of renal oncocytoma with radiofrequency ablation.  MRI LUMBAR SPINE WITHOUT CONTRAST  Technique:  Multiplanar and multiecho pulse sequences of the lumbar spine were obtained without intravenous contrast.  Comparison: Abdomen CT dated 09/22/2011.  Findings: The the patient five non-rib bearing lumbar vertebrae on the previous CT.  The last open disc space is labeled the L5-S1 level today.  The previously demonstrated post ablation appearance of the lower pole of the right kidney is unchanged.  Disc degeneration throughout the lumbar and lower thoracic spine.  T12-L1:  Two small hemangiomata in the L1 vertebral body. Otherwise, unremarkable.  L1-2:  Bilateral facet hypertrophy with mild canal stenosis.  L2-3:  Bilateral facet and ligamentum flavum  hypertrophy and mild to moderate diffuse posterior disc bulging, causing moderate canal stenosis.  No significant foraminal stenosis.  L3-4:  Bilateral facet hypertrophy with grade 1 anterolisthesis and mild diffuse posterior disc bulging.  Bilateral ligamentum flavum hypertrophy.  These changes are causing marked canal stenosis, moderate to marked foraminal stenosis on the right and moderate foraminal stenosis on the left.  There are is flattening of both L3 nerve roots within the foramina.  L4-5:  Bilateral facet and ligamentum flavum hypertrophy and moderate diffuse posterior disc bulging, producing moderate canal stenosis, moderate foraminal stenosis on the left and no significant foraminal stenosis on the right.  There is some flattening of the left L4 nerve root within the foramen.  L5-S1:  Mild diffuse disc bulging.  Facet hypertrophy on the left. Mild foraminal stenosis on the left.  No significant foraminal stenosis on the right.  The conus medullaris has a normal appearance with its tip at the L2- 3 level.  IMPRESSION:  1.  Multilevel degenerative changes, as described above. 2.  Canal and foraminal stenosis at multiple levels, as described above.  This is most pronounced at the L3-4 level, where there is marked canal stenosis and moderate to marked foraminal stenosis on the right with some nerve root flattening in the foramen. 3.  Stable post ablation changes in the lower pole of the right kidney.   Original Report Authenticated By: Claudie Revering, M.D.    Dg Knee Complete 4 Views Right  12/16/2011  *RADIOLOGY REPORT*  Clinical Data: Leg pain  RIGHT KNEE - COMPLETE 4+ VIEW  Comparison: 10/10/2006  Findings: Four views of the right knee submitted.  No acute fracture or subluxation.  There is right knee prosthesis in anatomic alignment.  Dystrophic calcifications are noted in the soft tissue infrapatellar region.  No joint effusion.  IMPRESSION: No acute fracture or subluxation.  Right knee prosthesis  in  anatomic alignment.   Original Report Authenticated By: Lahoma Crocker, M.D.      1. Leg pain, right       MDM  R posterior leg pain that onset with driving.  No weakness, numbness, tingling. Urine incontinence at baseline. No fever chest pain, SOB.    Xrays negative.  UA negative.  Pain with raising R leg from bed but no weakness. Palpable DP pulses  Will obtain doppler to r.o DVT as well as MRI L spine given pain and weakness in leg. No evidence of acute limb ischemia or DVT. No evidence of fracture.  D/w NP smith who will follow up studies and ensure can ambulate.     Ezequiel Essex, MD 12/16/11 2222

## 2011-12-16 NOTE — ED Notes (Signed)
Per pt right leg pain since yesterday. sts the back is in her back and shoots down her leg.

## 2011-12-16 NOTE — Progress Notes (Signed)
VASCULAR LAB PRELIMINARY  PRELIMINARY  PRELIMINARY  PRELIMINARY  Right lower extremity venous duplex completed.    Preliminary report:  Right:  No obvious evidence of DVT, superficial thrombosis, or Baker's cyst.  Technically limited by edema and body habitus.  Mid to distal FV and calf veins not adequately visualized.  Trev Boley, RVT 12/16/2011, 5:30 PM

## 2011-12-16 NOTE — ED Notes (Signed)
Pt. To XRAY.

## 2011-12-16 NOTE — ED Provider Notes (Signed)
Lab and radiology results reviewed, have been discussed with Dr. Wyvonnia Dusky prior to his departure, shared with patient and patient's family.  Patient's pain has resolved.  Ambulatory to bathroom without difficulty.  Patient to follow-up with her PCP early next week.  Norman Herrlich, NP 12/16/11 [phone]

## 2011-12-17 NOTE — ED Provider Notes (Signed)
Medical screening examination/treatment/procedure(s) were conducted as a shared visit with non-physician practitioner(s) and myself.  I personally evaluated the patient during the encounter   Ezequiel Essex, MD 12/17/11 1140

## 2011-12-27 ENCOUNTER — Encounter: Payer: Self-pay | Admitting: Pulmonary Disease

## 2012-01-02 ENCOUNTER — Telehealth: Payer: Self-pay | Admitting: Pulmonary Disease

## 2012-01-02 NOTE — Telephone Encounter (Signed)
Per Dr. Juanetta Gosling last note, she is to use Pulimcort BID and albuterol prn.  Pt is aware of this information.

## 2012-02-14 ENCOUNTER — Telehealth: Payer: Self-pay | Admitting: Pulmonary Disease

## 2012-02-17 NOTE — Telephone Encounter (Signed)
Called, spoke with pt.  Pt states [name] called [place].  Pt states Mae is not there right now.  She will ask Mae what this was about and have her call back.  Pt was unsure of the question regarding singulair.  Will await call back.

## 2012-02-28 ENCOUNTER — Other Ambulatory Visit: Payer: Self-pay | Admitting: Internal Medicine

## 2012-02-28 DIAGNOSIS — Z1231 Encounter for screening mammogram for malignant neoplasm of breast: Secondary | ICD-10-CM

## 2012-03-01 ENCOUNTER — Ambulatory Visit: Payer: Medicare Other | Attending: Internal Medicine

## 2012-03-01 DIAGNOSIS — R5381 Other malaise: Secondary | ICD-10-CM | POA: Insufficient documentation

## 2012-03-01 DIAGNOSIS — M6281 Muscle weakness (generalized): Secondary | ICD-10-CM | POA: Insufficient documentation

## 2012-03-01 DIAGNOSIS — M2550 Pain in unspecified joint: Secondary | ICD-10-CM | POA: Insufficient documentation

## 2012-03-01 DIAGNOSIS — IMO0001 Reserved for inherently not codable concepts without codable children: Secondary | ICD-10-CM | POA: Insufficient documentation

## 2012-03-01 DIAGNOSIS — M2560 Stiffness of unspecified joint, not elsewhere classified: Secondary | ICD-10-CM | POA: Insufficient documentation

## 2012-03-08 ENCOUNTER — Ambulatory Visit: Payer: Medicare Other | Admitting: Physical Therapy

## 2012-03-13 ENCOUNTER — Ambulatory Visit: Payer: Medicare Other | Attending: Internal Medicine | Admitting: Physical Therapy

## 2012-03-13 DIAGNOSIS — R5381 Other malaise: Secondary | ICD-10-CM | POA: Insufficient documentation

## 2012-03-13 DIAGNOSIS — M6281 Muscle weakness (generalized): Secondary | ICD-10-CM | POA: Insufficient documentation

## 2012-03-13 DIAGNOSIS — IMO0001 Reserved for inherently not codable concepts without codable children: Secondary | ICD-10-CM | POA: Insufficient documentation

## 2012-03-13 DIAGNOSIS — M2560 Stiffness of unspecified joint, not elsewhere classified: Secondary | ICD-10-CM | POA: Insufficient documentation

## 2012-03-13 DIAGNOSIS — M2550 Pain in unspecified joint: Secondary | ICD-10-CM | POA: Insufficient documentation

## 2012-03-14 ENCOUNTER — Other Ambulatory Visit: Payer: Self-pay | Admitting: Pulmonary Disease

## 2012-03-15 ENCOUNTER — Ambulatory Visit: Payer: Medicare Other | Admitting: Physical Therapy

## 2012-03-20 ENCOUNTER — Encounter: Payer: Medicare Other | Admitting: Physical Therapy

## 2012-03-20 ENCOUNTER — Ambulatory Visit: Payer: Medicare Other | Admitting: Rehabilitation

## 2012-03-22 ENCOUNTER — Ambulatory Visit: Payer: Medicare Other | Admitting: Rehabilitation

## 2012-03-27 ENCOUNTER — Ambulatory Visit: Payer: Medicare Other | Admitting: Rehabilitation

## 2012-03-29 ENCOUNTER — Ambulatory Visit: Payer: Medicare Other | Admitting: Rehabilitation

## 2012-04-03 ENCOUNTER — Ambulatory Visit: Payer: Medicare Other | Admitting: Rehabilitation

## 2012-04-06 ENCOUNTER — Ambulatory Visit: Payer: Medicare Other

## 2012-04-10 ENCOUNTER — Ambulatory Visit: Payer: Medicare Other | Attending: Internal Medicine | Admitting: Rehabilitation

## 2012-04-10 DIAGNOSIS — M6281 Muscle weakness (generalized): Secondary | ICD-10-CM | POA: Insufficient documentation

## 2012-04-10 DIAGNOSIS — IMO0001 Reserved for inherently not codable concepts without codable children: Secondary | ICD-10-CM | POA: Insufficient documentation

## 2012-04-10 DIAGNOSIS — M2550 Pain in unspecified joint: Secondary | ICD-10-CM | POA: Insufficient documentation

## 2012-04-10 DIAGNOSIS — R5381 Other malaise: Secondary | ICD-10-CM | POA: Insufficient documentation

## 2012-04-10 DIAGNOSIS — M2560 Stiffness of unspecified joint, not elsewhere classified: Secondary | ICD-10-CM | POA: Insufficient documentation

## 2012-04-12 ENCOUNTER — Ambulatory Visit: Payer: Medicare Other

## 2012-04-16 ENCOUNTER — Ambulatory Visit
Admission: RE | Admit: 2012-04-16 | Discharge: 2012-04-16 | Disposition: A | Discharge status: Home / Self Care | Payer: Medicare Other | Source: Ambulatory Visit | Attending: Internal Medicine | Admitting: Internal Medicine

## 2012-04-16 DIAGNOSIS — Z1231 Encounter for screening mammogram for malignant neoplasm of breast: Secondary | ICD-10-CM

## 2012-04-17 ENCOUNTER — Ambulatory Visit: Payer: Medicare Other | Admitting: Physical Therapy

## 2012-04-19 ENCOUNTER — Other Ambulatory Visit: Payer: Self-pay | Admitting: *Deleted

## 2012-04-19 ENCOUNTER — Ambulatory Visit: Payer: Medicare Other | Admitting: Physical Therapy

## 2012-04-19 MED ORDER — MONTELUKAST SODIUM 10 MG PO TABS: ORAL_TABLET | ORAL | Status: DC

## 2012-04-24 ENCOUNTER — Ambulatory Visit: Payer: Medicare Other

## 2012-04-26 ENCOUNTER — Ambulatory Visit: Payer: Medicare Other | Admitting: Physical Therapy

## 2012-05-01 ENCOUNTER — Ambulatory Visit: Payer: Medicare Other

## 2012-05-03 ENCOUNTER — Ambulatory Visit: Payer: Medicare Other

## 2012-05-15 ENCOUNTER — Ambulatory Visit: Payer: Medicare Other | Attending: Internal Medicine

## 2012-05-15 DIAGNOSIS — IMO0001 Reserved for inherently not codable concepts without codable children: Secondary | ICD-10-CM | POA: Insufficient documentation

## 2012-05-15 DIAGNOSIS — M2550 Pain in unspecified joint: Secondary | ICD-10-CM | POA: Insufficient documentation

## 2012-05-15 DIAGNOSIS — M6281 Muscle weakness (generalized): Secondary | ICD-10-CM | POA: Insufficient documentation

## 2012-05-15 DIAGNOSIS — R5381 Other malaise: Secondary | ICD-10-CM | POA: Insufficient documentation

## 2012-05-15 DIAGNOSIS — M2560 Stiffness of unspecified joint, not elsewhere classified: Secondary | ICD-10-CM | POA: Insufficient documentation

## 2012-05-17 ENCOUNTER — Ambulatory Visit: Payer: Medicare Other

## 2012-07-03 IMAGING — CT CT GUIDANCE TISSUE ABLATION
1 of 18 series · 3 of 32 positions shown, 7 images · non-contrast
Comparison: none

07/20/2009 – DUPLICATE COPY for exam association in [facility] – No change from original report.
CLINICAL DATA: Right renal mass. Medical comorbidity use place
 the patient at higher risk for surgical resection. The patient
 presents for biopsy and percutaneous ablation.

[Series 2: rt renal st · axial · 0.79mm/px · z∈[-187,-37]mm · 3 of 31 slices shown, 7 images]
[im 1/31  soft-tissue]
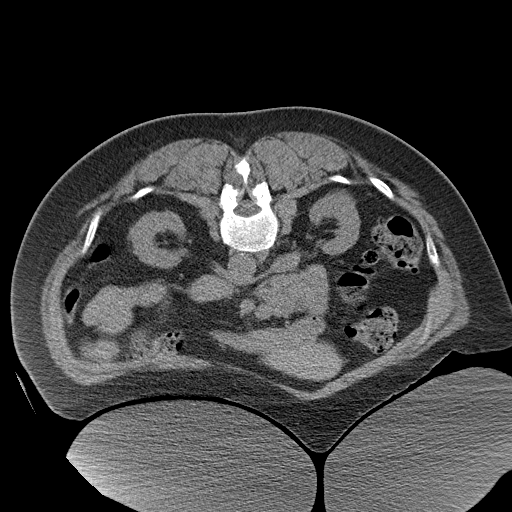
[im 1/31  lung]
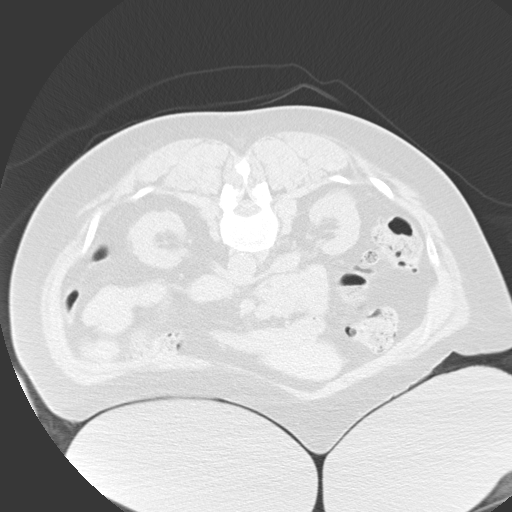
[im 1/31  bone]
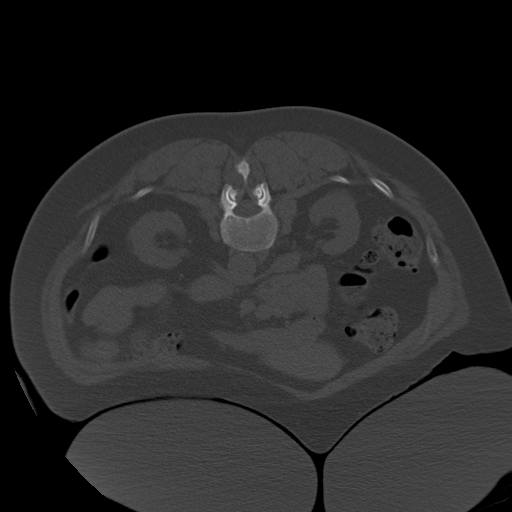
[im 16/31  soft-tissue]
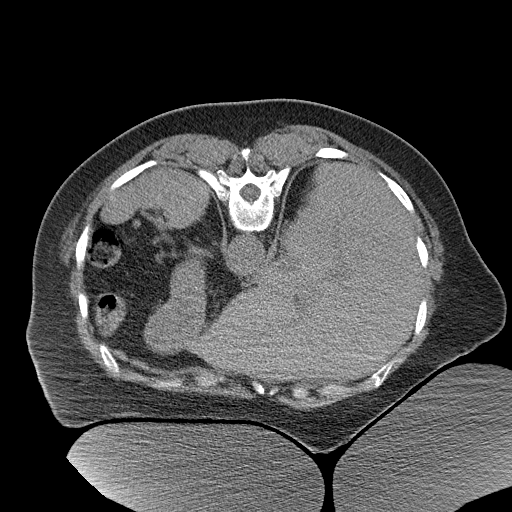
[im 16/31  lung]
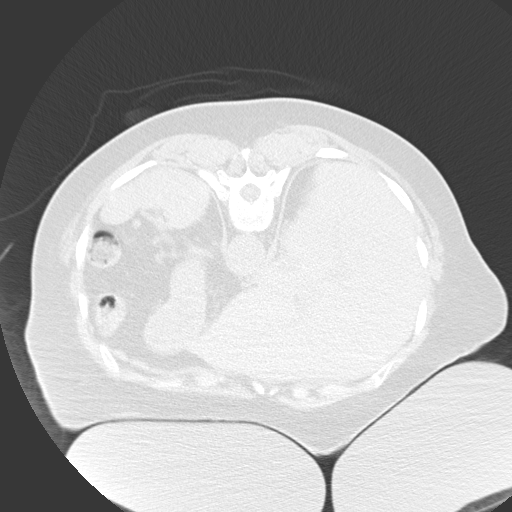
[im 31/31  soft-tissue]
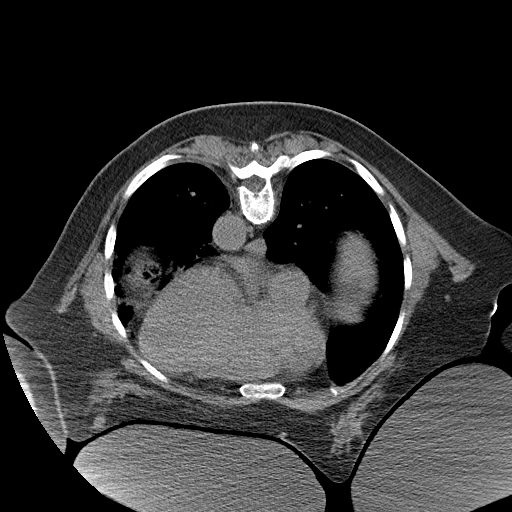
[im 31/31  lung]
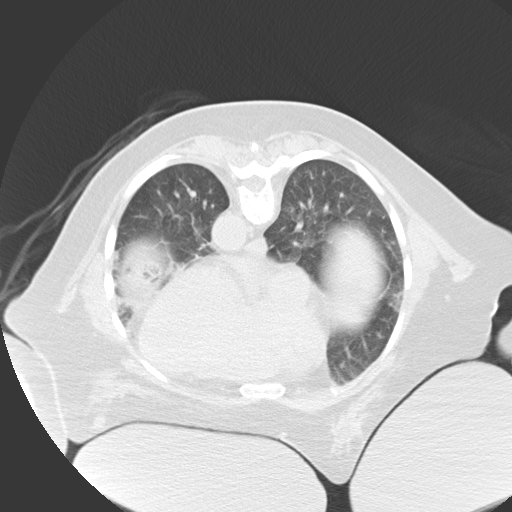

[3 of 32 positions shown; findings below may reference images not displayed]

1. CT GUIDED CORE BIOPSY OF RIGHT RENAL TUMOR
 2. CT-GUIDED PERCUTANEOUS CRYOABLATION OF RIGHT RENAL TUMOR

 Anesthesia: General

 Medications: 1 gram IV Ancef. The antibiotic was administered in
 an appropriate time interval prior to needle puncture of the skin.

 Contrast: None

 Procedure: The procedure, risks, benefits, and alternatives were
 explained to the patient. Questions regarding the procedure were
 encouraged and answered. The patient understands and consents to
 the procedure.

 The patient was placed under general anesthesia. Initial
 unenhanced CT was performed in a prone position to localize the
 right renal tumor.

 The right flank region was prepped with Betadine in a sterile
 fashion, and a sterile drape was applied covering the operative
 field. A sterile gown and sterile gloves were used for the
 procedure.

 Under CT guidance, a 17 gauge trocar needle was advanced into the
 posterior aspect of the mass. Coaxial core biopsy samples were
 obtained with an 18 gauge device. A total of three core biopsy
 samples were submitted in formalin.

 Under CT guidance, a series of three 15 cm length Ierc-H3
 cryoablation probes were advanced in a tandem fashion into the
 tumor. Probe positioning was confirmed by CT prior to ablation.

 Cryoablation was performed with initial 10-minute freeze cycle via
 all three probes followed by 8-minute thaw and a second 10-minute
 freeze cycle. Formation of the iceball was monitored by CT during
 the procedure. Post-procedural CT was performed.

 Complications: None
FINDINGS: Well-circumscribed tumor is seen emanating in an
 exophytic fashion off of the posterior cortex of the lower pole of
 the right kidney. The tumor measures approximately 3.3 cm in
 greatest diameter by unenhanced CT. Solid tissue samples were
 obtained.

 Cryoablation was performed via three separate probes. These were
 positioned with a single probe in the superior aspect of the tumor
 and two parallel probes in the mid to inferior aspect of the tumor.
 During the procedure, monitoring CT demonstrates adequate ice ball
 formation encompassing the entire tumor.
IMPRESSION: CT guided percutaneous core biopsy and cryoablation of right renal
 tumor. The patient will be observed overnight. Initial follow-up
 will be performed in approximately 4 weeks.

## 2012-07-23 ENCOUNTER — Encounter: Payer: Self-pay | Admitting: Adult Health

## 2012-07-23 ENCOUNTER — Ambulatory Visit (INDEPENDENT_AMBULATORY_CARE_PROVIDER_SITE_OTHER): Payer: Medicare Other | Admitting: Adult Health

## 2012-07-23 ENCOUNTER — Other Ambulatory Visit (INDEPENDENT_AMBULATORY_CARE_PROVIDER_SITE_OTHER): Payer: Medicare Other

## 2012-07-23 VITALS — BP 164/70 | HR 88 | Temp 98.0°F | Ht 64.0 in | Wt 252.0 lb

## 2012-07-23 DIAGNOSIS — R0600 Dyspnea, unspecified: Secondary | ICD-10-CM

## 2012-07-23 DIAGNOSIS — R0609 Other forms of dyspnea: Secondary | ICD-10-CM

## 2012-07-23 DIAGNOSIS — J449 Chronic obstructive pulmonary disease, unspecified: Secondary | ICD-10-CM

## 2012-07-23 DIAGNOSIS — R0989 Other specified symptoms and signs involving the circulatory and respiratory systems: Secondary | ICD-10-CM

## 2012-07-23 LAB — BASIC METABOLIC PANEL
CO2: 23 mEq/L (ref 19–32)
Calcium: 8.7 mg/dL (ref 8.4–10.5)
Chloride: 112 mEq/L (ref 96–112)
Glucose, Bld: 226 mg/dL — ABNORMAL HIGH (ref 70–99)
Sodium: 140 mEq/L (ref 135–145)

## 2012-07-23 MED ORDER — PREDNISONE 10 MG PO TABS: ORAL_TABLET | ORAL | Status: DC
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
## 2012-07-23 NOTE — Patient Instructions (Addendum)
Prednisone taper over next week.  Take your lasix when you get home  Mucinex DM Twice daily  As needed   Set up for 2 D echo and over night oximetry  I will call with labs  follow up Dr. Halford Chessman  In 2-3 weeks  (or Jilian West )  Low salt diet .  Call if sugars are >250  Please contact office for sooner follow up if symptoms do not improve or worsen or seek emergency care

## 2012-07-24 NOTE — Assessment & Plan Note (Signed)
Flare with smoke exposure  ?underlying diastolic hrt failure component -check bnp, echo  ? OSA (w/ daytime sleepiness) -set up for ONO - if positive consider sleep study   Plan  Prednisone taper over next week.  Take your lasix when you get home  Mucinex DM Twice daily  As needed   Set up for 2 D echo and over night oximetry  I will call with labs  follow up Dr. Halford Chessman  In 2-3 weeks  (or Kainen Struckman )  Low salt diet .  Call if sugars are >250  Please contact office for sooner follow up if symptoms do not improve or worsen or seek emergency care

## 2012-07-24 NOTE — Progress Notes (Signed)
Reviewed and agree with assessment/plan. 

## 2012-07-26 NOTE — Progress Notes (Signed)
Quick Note:  LMOM TCB x1. Labs faxed via biscom to Dr Reynaldo Minium. ______

## 2012-07-27 ENCOUNTER — Ambulatory Visit (HOSPITAL_COMMUNITY): Payer: Medicare Other | Attending: Cardiology | Admitting: Radiology

## 2012-07-27 DIAGNOSIS — R0989 Other specified symptoms and signs involving the circulatory and respiratory systems: Secondary | ICD-10-CM | POA: Insufficient documentation

## 2012-07-27 DIAGNOSIS — M51379 Other intervertebral disc degeneration, lumbosacral region without mention of lumbar back pain or lower extremity pain: Secondary | ICD-10-CM | POA: Insufficient documentation

## 2012-07-27 DIAGNOSIS — E785 Hyperlipidemia, unspecified: Secondary | ICD-10-CM | POA: Insufficient documentation

## 2012-07-27 DIAGNOSIS — E669 Obesity, unspecified: Secondary | ICD-10-CM | POA: Insufficient documentation

## 2012-07-27 DIAGNOSIS — R011 Cardiac murmur, unspecified: Secondary | ICD-10-CM | POA: Insufficient documentation

## 2012-07-27 DIAGNOSIS — M5137 Other intervertebral disc degeneration, lumbosacral region: Secondary | ICD-10-CM | POA: Insufficient documentation

## 2012-07-27 DIAGNOSIS — R0609 Other forms of dyspnea: Secondary | ICD-10-CM | POA: Insufficient documentation

## 2012-07-27 DIAGNOSIS — Z87891 Personal history of nicotine dependence: Secondary | ICD-10-CM | POA: Insufficient documentation

## 2012-07-27 DIAGNOSIS — R0600 Dyspnea, unspecified: Secondary | ICD-10-CM

## 2012-07-27 DIAGNOSIS — J4489 Other specified chronic obstructive pulmonary disease: Secondary | ICD-10-CM | POA: Insufficient documentation

## 2012-07-27 DIAGNOSIS — J449 Chronic obstructive pulmonary disease, unspecified: Secondary | ICD-10-CM | POA: Insufficient documentation

## 2012-07-27 DIAGNOSIS — E119 Type 2 diabetes mellitus without complications: Secondary | ICD-10-CM | POA: Insufficient documentation

## 2012-07-27 NOTE — Progress Notes (Signed)
Echocardiogram performed.  

## 2012-08-01 ENCOUNTER — Encounter: Payer: Self-pay | Admitting: Adult Health

## 2012-08-01 ENCOUNTER — Telehealth: Payer: Self-pay | Admitting: Adult Health

## 2012-08-01 NOTE — Progress Notes (Signed)
Quick Note:  Pt aware ______ 

## 2012-08-01 NOTE — Telephone Encounter (Signed)
Notes Recorded by Melvenia Needles, NP on 07/24/2012 at 11:43 AM Labs show bs is elevated, follow up with PCP  Fax labs to PCP  Renal fxn appears improved  Cont w/ ov recs   Spoke with pt and notified of results per [facility]. Pt verbalized understanding and denied any questions.

## 2012-08-03 NOTE — Progress Notes (Signed)
Quick Note:  Pt returned call and is aware of results per 7.23.14 phone note. ______

## 2012-08-07 ENCOUNTER — Encounter: Payer: Self-pay | Admitting: Adult Health

## 2012-08-07 ENCOUNTER — Ambulatory Visit (INDEPENDENT_AMBULATORY_CARE_PROVIDER_SITE_OTHER): Payer: Medicare Other | Admitting: Adult Health

## 2012-08-07 VITALS — BP 128/74 | HR 69 | Temp 97.9°F | Wt 241.4 lb

## 2012-08-07 DIAGNOSIS — J449 Chronic obstructive pulmonary disease, unspecified: Secondary | ICD-10-CM
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
## 2012-08-07 NOTE — Progress Notes (Signed)
Reviewed and agree with assessment/plan. 

## 2012-08-07 NOTE — Patient Instructions (Addendum)
Set up for sleep study. -split night .  Begin Oxygen 2 l/m At bedtime   Low salt diet .  Follow up with Dr. Halford Chessman  In 6-8 weeks .  Please contact office for sooner follow up if symptoms do not improve or worsen or seek emergency care

## 2012-08-07 NOTE — Addendum Note (Signed)
Addended by: Parke Poisson E on: 08/07/2012 03:42 PM   Modules accepted: Orders

## 2012-08-07 NOTE — Assessment & Plan Note (Signed)
Reason exacerbation, now resolving. Patient with desaturations on overnight oximetry test was set up patient for sleep study to rule out possible underlying sleep apnea. Echo showed normal ejection fracture, but with elevated pulmonary artery pressures. Will follow sleep study to see if this could be contributing to her pulmonary hypertension  Plan  Set up for sleep study. -split night .  Begin Oxygen 2 l/m At bedtime   Low salt diet .  Follow up with Dr. Halford Chessman  In 6-8 weeks .  Please contact office for sooner follow up if symptoms do not improve or worsen or seek emergency care

## 2012-08-08 ENCOUNTER — Telehealth: Payer: Self-pay | Admitting: Pulmonary Disease

## 2012-08-08 NOTE — Telephone Encounter (Signed)
Melissa returned call. Please call back when available.

## 2012-08-08 NOTE — Telephone Encounter (Signed)
lmtcb x1 for [employer]

## 2012-08-08 NOTE — Telephone Encounter (Signed)
Called spoke with Melissa who verified that Medicare w/ only pay for nocturnal O2 if the pt desats for > 80mins.  Pt was only at approx 4:30sec.  Melissa stated that can try to qualify w/ ambulation or they can re-do the ONO, or pt can self-pay.  Advised Lenna Sciara will speak with TP about this when she returns to the office in the AM.

## 2012-08-09 NOTE — Telephone Encounter (Signed)
MCR will not cover o2 Will need to cont to watch  follow up at next ov  Please contact office for sooner follow up if symptoms do not improve or worsen or seek emergency care

## 2012-08-10 NOTE — Telephone Encounter (Signed)
Called, spoke with pt -  Explained below to her.  She verbalized understanding of this.  She is aware to keep Split Night Study in Aug and to keep OV with VS in Sept.  She is to call with any further questions or concerns in the meantime.  She verbalized understanding and voiced no further questions or concerns at this time.

## 2012-08-10 NOTE — Telephone Encounter (Signed)
Returning call can be reached at [phone].Elnita Maxwell

## 2012-08-10 NOTE — Telephone Encounter (Signed)
Called spoke with Melissa w/ [facility] to see if an order needs to be placed to cancel the O2; per Melissa, there does not.  LMOM TCB x1 for pt to discuss with her.

## 2012-08-12 ENCOUNTER — Encounter (HOSPITAL_COMMUNITY): Payer: Self-pay | Admitting: *Deleted

## 2012-08-12 ENCOUNTER — Observation Stay (HOSPITAL_COMMUNITY)
Admission: EM | Admit: 2012-08-12 | Discharge: 2012-08-15 | Disposition: A | Discharge status: Home / Self Care | Payer: Medicare Other | Attending: Internal Medicine | Admitting: Internal Medicine

## 2012-08-12 DIAGNOSIS — D126 Benign neoplasm of colon, unspecified: Secondary | ICD-10-CM

## 2012-08-12 DIAGNOSIS — J449 Chronic obstructive pulmonary disease, unspecified: Secondary | ICD-10-CM

## 2012-08-12 DIAGNOSIS — R0609 Other forms of dyspnea: Secondary | ICD-10-CM | POA: Insufficient documentation

## 2012-08-12 DIAGNOSIS — Z860100 Personal history of colon polyps, unspecified: Secondary | ICD-10-CM | POA: Insufficient documentation

## 2012-08-12 DIAGNOSIS — R194 Change in bowel habit: Secondary | ICD-10-CM

## 2012-08-12 DIAGNOSIS — R490 Dysphonia: Secondary | ICD-10-CM

## 2012-08-12 DIAGNOSIS — K625 Hemorrhage of anus and rectum: Secondary | ICD-10-CM

## 2012-08-12 DIAGNOSIS — K648 Other hemorrhoids: Secondary | ICD-10-CM | POA: Insufficient documentation

## 2012-08-12 DIAGNOSIS — R0989 Other specified symptoms and signs involving the circulatory and respiratory systems: Secondary | ICD-10-CM | POA: Insufficient documentation

## 2012-08-12 DIAGNOSIS — D638 Anemia in other chronic diseases classified elsewhere: Secondary | ICD-10-CM | POA: Diagnosis present

## 2012-08-12 DIAGNOSIS — D62 Acute posthemorrhagic anemia: Secondary | ICD-10-CM | POA: Diagnosis present

## 2012-08-12 DIAGNOSIS — K921 Melena: Principal | ICD-10-CM | POA: Insufficient documentation

## 2012-08-12 DIAGNOSIS — I10 Essential (primary) hypertension: Secondary | ICD-10-CM | POA: Insufficient documentation

## 2012-08-12 DIAGNOSIS — Z79899 Other long term (current) drug therapy: Secondary | ICD-10-CM | POA: Insufficient documentation

## 2012-08-12 DIAGNOSIS — D649 Anemia, unspecified: Secondary | ICD-10-CM

## 2012-08-12 DIAGNOSIS — K5730 Diverticulosis of large intestine without perforation or abscess without bleeding: Secondary | ICD-10-CM | POA: Diagnosis present

## 2012-08-12 DIAGNOSIS — K922 Gastrointestinal hemorrhage, unspecified: Secondary | ICD-10-CM

## 2012-08-12 DIAGNOSIS — E119 Type 2 diabetes mellitus without complications: Secondary | ICD-10-CM | POA: Insufficient documentation

## 2012-08-12 DIAGNOSIS — R159 Full incontinence of feces: Secondary | ICD-10-CM

## 2012-08-12 DIAGNOSIS — M1990 Unspecified osteoarthritis, unspecified site: Secondary | ICD-10-CM

## 2012-08-12 DIAGNOSIS — J4489 Other specified chronic obstructive pulmonary disease: Secondary | ICD-10-CM | POA: Diagnosis present

## 2012-08-12 NOTE — ED Notes (Signed)
Pt states she has bright red blood coming from rectum. She states when she goes to bathroom it is like water.

## 2012-08-12 NOTE — ED Provider Notes (Signed)
CSN: [id number]     Arrival date & time 08/12/12  2132 History     First MD Initiated Contact with Patient 08/12/12 2147     Chief Complaint  Patient presents with  . Rectal Bleeding   (Consider location/radiation/quality/duration/timing/severity/associated sxs/prior Treatment) HPI Comments: Patient presents to the ER for evaluation of rectal bleeding. Patient reports that she started having small amounts of blood per rectum 2 or 3 days ago. Since then she has had progressively worsening bleeding. Patient reports she has had multiple episodes today of large amounts of red bloody diarrhea today. She is not experiencing any abdominal pain. She has not had fever. Patient denies chest pain but is short of breath. She does report a history of asthma, however.  Patient is a 72 y.o. female presenting with hematochezia.  Rectal Bleeding Associated symptoms: no abdominal pain and no vomiting     Past Medical History  Diagnosis Date  . Arthritis   . Lumbar disc disease   . Asthma   . Depression   . Diabetes mellitus   . GERD (gastroesophageal reflux disease)   . Hypercholesterolemia   . HTN (hypertension)   . Peptic ulcer disease   . Valgus foot     right   . Gastric ulcer   . Adenomatous colon polyp   . Diverticulosis   . Internal hemorrhoids   . 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 Anemia   . Umbilical hernia   . 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 Total knee arthroplasty      Bilateral  . Incise and drain abcess      Abdominal Wall  . Umbilical hernia repair  2007    incarcerated  . Lipoma resection      Right Neck  . Colonoscopy  2209 or 2010     2001, 2005, 2009  colonoscopy.  [place]  . Upper gastrointestinal endoscopy  2001    medoff Grade A esophagitis   Family History  Problem Relation Age of Onset  . Breast cancer Maternal Aunt    History  Substance Use Topics  . Smoking status: Former Smoker --  2.00 packs/day for 5 years    Types: Cigarettes    Quit date: 01/10/1958  . Smokeless tobacco: Never Used  . Alcohol Use: No     Comment: Quit when she quit smoking   OB History   Grav Para Term Preterm Abortions TAB SAB Ect Mult Living                 Review of Systems  Respiratory: Positive for shortness of breath and wheezing.   Cardiovascular: Negative for chest pain.  Gastrointestinal: Positive for diarrhea, hematochezia and anal bleeding. Negative for vomiting and abdominal pain.  All other systems reviewed and are negative.    Allergies  Review of patient's allergies indicates no known allergies.  Home Medications   Current Outpatient Rx  Name  Route  Sig  Dispense  Refill  . albuterol (PROAIR HFA) 108 (90 BASE) MCG/ACT inhaler   Inhalation   Inhale 2 puffs into the lungs every 6 (six) hours as needed for wheezing.         Marland Kitchen albuterol (PROVENTIL) (2.5 MG/3ML) 0.083% nebulizer solution   Nebulization   Take 3 mLs (2.5 mg total) by nebulization 4 (four) times daily.   75 mL   12   . amLODipine (NORVASC) 5 MG tablet  Oral   Take 10 mg by mouth daily.          . Ascorbic Acid (VITAMIN C) 1000 MG tablet   Oral   Take 1,000 mg by mouth daily.         Marland Kitchen augmented betamethasone dipropionate (DIPROLENE-AF) 0.05 % cream   Topical   Apply 1 application topically 2 (two) times daily. To both legs         . budesonide (PULMICORT) 0.25 MG/2ML nebulizer solution   Nebulization   Take 2 mLs (0.25 mg total) by nebulization 2 (two) times daily.   60 mL   12   . Calcium Carbonate-Vitamin D (CALTRATE 600+D) 600-400 MG-UNIT per tablet   Oral   Take 1 tablet by mouth daily.         . ergocalciferol (VITAMIN D2) 50000 UNITS capsule   Oral   Take 50,000 Units by mouth once a week.         . esomeprazole (NEXIUM) 40 MG capsule   Oral   Take 40 mg by mouth daily before breakfast.          . ferrous sulfate 325 (65 FE) MG tablet   Oral   Take 325 mg by  mouth daily with breakfast.         . fluticasone (FLONASE) 50 MCG/ACT nasal spray               . folic acid (FOLVITE) [id number] MCG tablet      1-2 per week         . furosemide (LASIX) 40 MG tablet   Oral   Take 80 mg by mouth daily.          Marland Kitchen gabapentin (NEURONTIN) 100 MG capsule   Oral   Take 100 mg by mouth daily.          Marland Kitchen galantamine (RAZADYNE) 8 MG tablet   Oral   Take 8 mg by mouth 2 (two) times daily.         Marland Kitchen glipiZIDE (GLUCOTROL XL) 10 MG 24 hr tablet   Oral   Take 10 mg by mouth 2 (two) times daily.         Marland Kitchen HUMALOG MIX 75/25 KWIKPEN (75-25) 100 UNIT/ML SUSP   Subcutaneous   Inject 15 Units into the skin every morning.         . hyoscyamine (ANASPAZ) 0.125 MG TBDP   Sublingual   Place 0.125 mg under the tongue every 8 (eight) hours as needed.         . insulin glargine (LANTUS) 100 UNIT/ML injection   Subcutaneous   Inject 35 Units into the skin daily.         . Magnesium 250 MG TABS   Oral   Take 1 tablet by mouth daily.         . montelukast (SINGULAIR) 10 MG tablet      TAKE 1 TABLET (10 MG TOTAL) BY MOUTH DAILY.   30 tablet   5   . Omega-3 Fatty Acids (FISH OIL) 1000 MG CAPS   Oral   Take 1 capsule by mouth daily.         . ondansetron (ZOFRAN) 4 MG tablet   Oral   Take 4 mg by mouth every 4 (four) hours as needed. For nausea         . polyethylene glycol (MIRALAX / GLYCOLAX) packet   Oral   Take 17 g by mouth daily.         Marland Kitchen  potassium chloride SA (K-DUR,KLOR-CON) 20 MEQ tablet   Oral   Take 20 mEq by mouth daily.         Marland Kitchen pyridOXINE (VITAMIN B-6) 100 MG tablet   Oral   Take 100 mg by mouth daily.         . sertraline (ZOLOFT) 50 MG tablet   Oral   Take 50 mg by mouth daily.         . simvastatin (ZOCOR) 10 MG tablet   Oral   Take 10 mg by mouth at bedtime.         Marland Kitchen VICODIN ES 7.5-300 MG TABS   Oral   Take 1 tablet by mouth Twice daily as needed.         . vitamin B-12  (CYANOCOBALAMIN) 1000 MCG tablet   Oral   Take 1,000 mcg by mouth daily.         . vitamin E 400 UNIT capsule   Oral   Take 400 Units by mouth daily.          BP 186/70  Pulse 72  Temp(Src) 98.4 F (36.9 C) (Oral)  Resp 22  SpO2 100% Physical Exam  Constitutional: She is oriented to person, place, and time. She appears well-developed and well-nourished. No distress.  HENT:  Head: Normocephalic and atraumatic.  Right Ear: Hearing normal.  Left Ear: Hearing normal.  Nose: Nose normal.  Mouth/Throat: Oropharynx is clear and moist and mucous membranes are normal.  Eyes: Conjunctivae and EOM are normal. Pupils are equal, round, and reactive to light.  Neck: Normal range of motion. Neck supple.  Cardiovascular: Regular rhythm, S1 normal and S2 normal.  Exam reveals no gallop and no friction rub.   No murmur heard. Pulmonary/Chest: Effort normal and breath sounds normal. No respiratory distress. She exhibits no tenderness.  Abdominal: Soft. Normal appearance and bowel sounds are normal. There is no hepatosplenomegaly. There is no tenderness. There is no rebound, no guarding, no tenderness at McBurney's point and negative Murphy's sign. No hernia.  Musculoskeletal: Normal range of motion.  Neurological: She is alert and oriented to person, place, and time. She has normal strength. No cranial nerve deficit or sensory deficit. Coordination normal. GCS eye subscore is 4. GCS verbal subscore is 5. GCS motor subscore is 6.  Skin: Skin is warm, dry and intact. No rash noted. No cyanosis.  Psychiatric: She has a normal mood and affect. Her speech is normal and behavior is normal. Thought content normal.    ED Course   Procedures (including critical care time)  Labs Reviewed  CBC  COMPREHENSIVE METABOLIC PANEL  PROTIME-INR  TROPONIN I  PRO B NATRIURETIC PEPTIDE  TYPE AND SCREEN   No results found.  Diagnoses: 1. Rectal bleeding 2. Dyspnea secondary to asthma  MDM  Patient  presents to the ER for evaluation of rectal bleeding. Patient has had progressively worsening but red blood per rectum for several days. She is not expecting any abdominal pain and abdominal exam is benign.  Also appears to be slightly tachypneic and dyspneic. She reports a history of asthma. She does not have any significant wheezing at this time but shortness of breath might be secondary to her chronic asthma. Anemia would also be considered.  Blood work has not been drawn yet. Patient will be signed out to Doctor Alvino Chapel to followup on labs. Anticipate hospitalization for GI bleed.  Orpah Greek, MD 08/12/12 [phone]

## 2012-08-13 ENCOUNTER — Encounter: Payer: Self-pay | Admitting: Gastroenterology

## 2012-08-13 ENCOUNTER — Encounter (HOSPITAL_COMMUNITY): Payer: Self-pay | Admitting: Internal Medicine

## 2012-08-13 DIAGNOSIS — K5730 Diverticulosis of large intestine without perforation or abscess without bleeding: Secondary | ICD-10-CM | POA: Diagnosis present

## 2012-08-13 DIAGNOSIS — D126 Benign neoplasm of colon, unspecified: Secondary | ICD-10-CM

## 2012-08-13 DIAGNOSIS — Z86010 Personal history of colonic polyps: Secondary | ICD-10-CM

## 2012-08-13 DIAGNOSIS — M1990 Unspecified osteoarthritis, unspecified site: Secondary | ICD-10-CM | POA: Insufficient documentation

## 2012-08-13 DIAGNOSIS — K922 Gastrointestinal hemorrhage, unspecified: Secondary | ICD-10-CM

## 2012-08-13 DIAGNOSIS — D649 Anemia, unspecified: Secondary | ICD-10-CM

## 2012-08-13 DIAGNOSIS — D62 Acute posthemorrhagic anemia: Secondary | ICD-10-CM | POA: Diagnosis present

## 2012-08-13 DIAGNOSIS — D638 Anemia in other chronic diseases classified elsewhere: Secondary | ICD-10-CM | POA: Diagnosis present

## 2012-08-13 DIAGNOSIS — K625 Hemorrhage of anus and rectum: Secondary | ICD-10-CM | POA: Diagnosis present

## 2012-08-13 LAB — CBC
HCT: 25.7 % — ABNORMAL LOW (ref 36.0–46.0)
Hemoglobin: 8 g/dL — ABNORMAL LOW (ref 12.0–15.0)
MCH: 28.9 pg (ref 26.0–34.0)
MCV: 84.5 fL (ref 78.0–100.0)
Platelets: 222 10*3/uL (ref 150–400)
Platelets: 244 10*3/uL (ref 150–400)
RBC: 2.75 MIL/uL — ABNORMAL LOW (ref 3.87–5.11)
RBC: 3.04 MIL/uL — ABNORMAL LOW (ref 3.87–5.11)

## 2012-08-13 LAB — PROTIME-INR: INR: 1.16 (ref 0.00–1.49)

## 2012-08-13 LAB — COMPREHENSIVE METABOLIC PANEL
AST: 16 U/L (ref 0–37)
Albumin: 2.9 g/dL — ABNORMAL LOW (ref 3.5–5.2)
BUN: 41 mg/dL — ABNORMAL HIGH (ref 6–23)
Calcium: 8.3 mg/dL — ABNORMAL LOW (ref 8.4–10.5)
Chloride: 110 mEq/L (ref 96–112)
Creatinine, Ser: 1.43 mg/dL — ABNORMAL HIGH (ref 0.50–1.10)
GFR calc non Af Amer: 36 mL/min — ABNORMAL LOW (ref >90)
Total Bilirubin: 0.2 mg/dL — ABNORMAL LOW (ref 0.3–1.2)

## 2012-08-13 LAB — BASIC METABOLIC PANEL
Calcium: 8 mg/dL — ABNORMAL LOW (ref 8.4–10.5)
GFR calc Af Amer: 43 mL/min — ABNORMAL LOW (ref >90)
GFR calc non Af Amer: 37 mL/min — ABNORMAL LOW (ref >90)
Potassium: 4 mEq/L (ref 3.5–5.1)
Sodium: 141 mEq/L (ref 135–145)

## 2012-08-13 LAB — TYPE AND SCREEN
ABO/RH(D): B POS
Antibody Screen: NEGATIVE

## 2012-08-13 LAB — GLUCOSE, CAPILLARY
Glucose-Capillary: 63 mg/dL — ABNORMAL LOW (ref 70–99)
Glucose-Capillary: 83 mg/dL (ref 70–99)
Glucose-Capillary: 123 mg/dL — ABNORMAL HIGH (ref 70–99)
Glucose-Capillary: 178 mg/dL — ABNORMAL HIGH (ref 70–99)

## 2012-08-13 LAB — HEMOGLOBIN A1C
Hgb A1c MFr Bld: 6 % — ABNORMAL HIGH (ref <5.7)
Mean Plasma Glucose: 126 mg/dL — ABNORMAL HIGH (ref <117)

## 2012-08-13 MED ORDER — PEG-KCL-NACL-NASULF-NA ASC-C 100 G PO SOLR
0.5000 | Freq: Once | ORAL | Status: AC
  Administered 2012-08-13: 100 g via ORAL
  Filled 2012-08-13: qty 1

## 2012-08-13 MED ORDER — ZOLPIDEM TARTRATE 5 MG PO TABS: 5.0000 mg | ORAL_TABLET | Freq: Every evening | ORAL | Status: DC | PRN

## 2012-08-13 MED ORDER — BISACODYL 5 MG PO TBEC
5.0000 mg | DELAYED_RELEASE_TABLET | Freq: Once | ORAL | Status: AC
  Administered 2012-08-14: 5 mg via ORAL
  Filled 2012-08-13 (×2): qty 1

## 2012-08-13 MED ORDER — BISACODYL 5 MG PO TBEC
5.0000 mg | DELAYED_RELEASE_TABLET | Freq: Once | ORAL | Status: AC
  Administered 2012-08-13: 5 mg via ORAL

## 2012-08-13 MED ORDER — FUROSEMIDE 80 MG PO TABS
80.0000 mg | ORAL_TABLET | Freq: Every day | ORAL | Status: DC
  Administered 2012-08-13 – 2012-08-15 (×3): 80 mg via ORAL
  Filled 2012-08-13 (×3): qty 1

## 2012-08-13 MED ORDER — BUDESONIDE 0.25 MG/2ML IN SUSP
0.2500 mg | Freq: Two times a day (BID) | RESPIRATORY_TRACT | Status: DC
  Administered 2012-08-13 – 2012-08-15 (×5): 0.25 mg via RESPIRATORY_TRACT
  Filled 2012-08-13 (×7): qty 2

## 2012-08-13 MED ORDER — SIMVASTATIN 20 MG PO TABS
20.0000 mg | ORAL_TABLET | Freq: Every evening | ORAL | Status: DC
  Administered 2012-08-13 – 2012-08-14 (×2): 20 mg via ORAL
  Filled 2012-08-13 (×3): qty 1

## 2012-08-13 MED ORDER — MONTELUKAST SODIUM 10 MG PO TABS
10.0000 mg | ORAL_TABLET | Freq: Every day | ORAL | Status: DC
  Administered 2012-08-13 – 2012-08-14 (×2): 10 mg via ORAL
  Filled 2012-08-13 (×3): qty 1

## 2012-08-13 MED ORDER — INSULIN DETEMIR 100 UNIT/ML ~~LOC~~ SOLN
20.0000 [IU] | Freq: Every day | SUBCUTANEOUS | Status: DC
  Administered 2012-08-13 – 2012-08-14 (×2): 20 [IU] via SUBCUTANEOUS
  Filled 2012-08-13 (×4): qty 0.2

## 2012-08-13 MED ORDER — PEG-KCL-NACL-NASULF-NA ASC-C 100 G PO SOLR: 1.0000 | Freq: Once | ORAL | Status: DC

## 2012-08-13 MED ORDER — ALUM & MAG HYDROXIDE-SIMETH 200-200-20 MG/5ML PO SUSP
30.0000 mL | Freq: Four times a day (QID) | ORAL | Status: DC | PRN
  Filled 2012-08-13 (×2): qty 30

## 2012-08-13 MED ORDER — ACETAMINOPHEN-CODEINE #3 300-30 MG PO TABS
1.0000 | ORAL_TABLET | ORAL | Status: DC | PRN
  Administered 2012-08-13 – 2012-08-14 (×4): 1 via ORAL
  Filled 2012-08-13 (×4): qty 1

## 2012-08-13 MED ORDER — ALBUTEROL SULFATE (5 MG/ML) 0.5% IN NEBU
2.5000 mg | INHALATION_SOLUTION | Freq: Four times a day (QID) | RESPIRATORY_TRACT | Status: DC
  Administered 2012-08-13 – 2012-08-15 (×9): 2.5 mg via RESPIRATORY_TRACT
  Filled 2012-08-13 (×9): qty 0.5

## 2012-08-13 MED ORDER — ONDANSETRON HCL 4 MG PO TABS
4.0000 mg | ORAL_TABLET | Freq: Four times a day (QID) | ORAL | Status: DC | PRN
  Administered 2012-08-13: 4 mg via ORAL
  Filled 2012-08-13: qty 1

## 2012-08-13 MED ORDER — POLYETHYLENE GLYCOL 3350 17 G PO PACK
17.0000 g | PACK | Freq: Every day | ORAL | Status: DC
  Administered 2012-08-13 – 2012-08-15 (×2): 17 g via ORAL
  Filled 2012-08-13 (×3): qty 1

## 2012-08-13 MED ORDER — POTASSIUM CHLORIDE IN NACL 20-0.9 MEQ/L-% IV SOLN
INTRAVENOUS | Status: DC
  Administered 2012-08-13 – 2012-08-14 (×3): via INTRAVENOUS
  Filled 2012-08-13 (×3): qty 1000

## 2012-08-13 MED ORDER — ONDANSETRON HCL 4 MG/2ML IJ SOLN: 4.0000 mg | Freq: Four times a day (QID) | INTRAMUSCULAR | Status: DC | PRN

## 2012-08-13 MED ORDER — PROCHLORPERAZINE MALEATE 5 MG PO TABS
5.0000 mg | ORAL_TABLET | Freq: Four times a day (QID) | ORAL | Status: DC | PRN
  Filled 2012-08-13 (×2): qty 1

## 2012-08-13 MED ORDER — FOLIC ACID 0.5 MG HALF TAB
400.0000 ug | ORAL_TABLET | Freq: Every day | ORAL | Status: DC
  Administered 2012-08-13 – 2012-08-15 (×3): 0.5 mg via ORAL
  Filled 2012-08-13 (×4): qty 1

## 2012-08-13 MED ORDER — PEG-KCL-NACL-NASULF-NA ASC-C 100 G PO SOLR
0.5000 | Freq: Once | ORAL | Status: AC
  Administered 2012-08-14: 100 g via ORAL

## 2012-08-13 MED ORDER — POLYVINYL ALCOHOL 1.4 % OP SOLN
Freq: Two times a day (BID) | OPHTHALMIC | Status: DC
  Administered 2012-08-13 – 2012-08-14 (×3): via OPHTHALMIC
  Administered 2012-08-14: 1 [drp] via OPHTHALMIC
  Administered 2012-08-14 – 2012-08-15 (×2): via OPHTHALMIC
  Filled 2012-08-13: qty 15

## 2012-08-13 MED ORDER — AMLODIPINE BESYLATE 2.5 MG PO TABS
2.5000 mg | ORAL_TABLET | Freq: Once | ORAL | Status: AC
  Administered 2012-08-13: 2.5 mg via ORAL
  Filled 2012-08-13: qty 1

## 2012-08-13 MED ORDER — INSULIN ASPART 100 UNIT/ML ~~LOC~~ SOLN
0.0000 [IU] | Freq: Three times a day (TID) | SUBCUTANEOUS | Status: DC
  Administered 2012-08-13: 3 [IU] via SUBCUTANEOUS
  Administered 2012-08-13: 2 [IU] via SUBCUTANEOUS
  Administered 2012-08-15: 5 [IU] via SUBCUTANEOUS

## 2012-08-13 NOTE — Progress Notes (Signed)
Pt has elevated blood pressure of 180/62 this evening. Avva MD made aware. New orders placed. Pt asymptomatic with no distress noted. Will continue to monitor. Velora Mediate

## 2012-08-13 NOTE — Consult Note (Signed)
[facility] Consult: 3:05 PM 08/13/2012   Referring Provider: Leanna Battles MD Primary Care Physician:  Geoffery Lyons, MD Primary Gastroenterologist:  Dr. Silvano Rusk.  Previously Dr. Earlean Shawl  Reason for Consultation:  Painless Hematochezia  HPI: [name] is a diabetic 72 y.o. female with COPD/asthma.   Had painless hematochezia and hgb of 7.9 in late 01/2011. This was attributed to diverticulosis.  Her last colonoscopy was in 2009 when she had adenomatous polyp and diverticulosis.  Was due for follow up surveillance colonoscopy in 07/2012.  This has not yet taken place.    Admitted last night with rectal bleeding.  It started on Friday 8/1 as blood with wiping after brown stool, same on Saturday.  Midday yesterday passed about 3 large stools of pure blood, there was no abdominal or rectal pain.  Last BM this AM was not bloody.  She has no nausea, no weakness or dizziness.  No new anticaogulant meds, takes only 81 ASA daily.  No Advil etc. Takes po Iron once daily at home.   Stable DOE.  No chest pain.    hgb in 12/2011 was 9.1.  Last night it was 8.8, today it is 8.0.  Coags are normal.   Past Medical History  Diagnosis Date  . Arthritis   . Lumbar disc disease   . Asthma   . Depression   . Diabetes mellitus   . GERD (gastroesophageal reflux disease)   . Hypercholesterolemia   . HTN (hypertension)   . Peptic ulcer disease   . Valgus foot     right   . Gastric ulcer   . Adenomatous colon polyp   . Diverticulosis   . Internal hemorrhoids   . 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 Anemia   . Umbilical hernia   . 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 Total knee arthroplasty      Bilateral  . Incise and drain abcess      Abdominal Wall  . Umbilical hernia repair  2007    incarcerated  . Lipoma resection      Right Neck  . Colonoscopy  2209 or 2010     2001,  2005, 2009  colonoscopy.  [place]  . Upper gastrointestinal endoscopy  2001    medoff Grade A esophagitis    Prior to Admission medications   Medication Sig Start Date End Date Taking? Authorizing Provider  acetaminophen-codeine (TYLENOL #3) 300-30 MG per tablet Take 1 tablet by mouth every 4 (four) hours as needed for pain.   Yes Historical Provider, MD  albuterol (PROAIR HFA) 108 (90 BASE) MCG/ACT inhaler Inhale 2 puffs into the lungs every 6 (six) hours as needed for wheezing. 04/20/11 07/23/13 Yes Chesley Mires, MD  albuterol (PROVENTIL) (2.5 MG/3ML) 0.083% nebulizer solution Take 3 mLs (2.5 mg total) by nebulization 4 (four) times daily. 10/13/11  Yes Chesley Mires, MD  amLODipine (NORVASC) 5 MG tablet Take 10 mg by mouth daily.    Yes Historical Provider, MD  Ascorbic Acid (VITAMIN C) 1000 MG tablet Take 1,000 mg by mouth daily.   Yes Historical Provider, MD  aspirin EC 81 MG tablet Take 81 mg by mouth daily.   Yes Historical Provider, MD  augmented betamethasone dipropionate (DIPROLENE-AF) 0.05 % cream Apply 1 application topically 2 (two) times daily. To both legs   Yes Historical Provider, MD  budesonide (PULMICORT) 0.25 MG/2ML nebulizer solution Take 2 mLs (0.25  mg total) by nebulization 2 (two) times daily. 11/15/11  Yes Chesley Mires, MD  Calcium Carbonate-Vitamin D (CALTRATE 600+D) 600-400 MG-UNIT per tablet Take 1 tablet by mouth daily.   Yes Historical Provider, MD  esomeprazole (NEXIUM) 40 MG capsule Take 40 mg by mouth daily before breakfast.    Yes Historical Provider, MD  ferrous sulfate 325 (65 FE) MG tablet Take 325 mg by mouth daily with breakfast.   Yes Historical Provider, MD  fluticasone (FLONASE) 50 MCG/ACT nasal spray Place 2 sprays into the nose daily.  03/14/12  Yes Chesley Mires, MD  folic acid (FOLVITE) A999333 MCG tablet Take 400 mcg by mouth daily.   Yes Historical Provider, MD  furosemide (LASIX) 40 MG tablet Take 80 mg by mouth daily.    Yes Historical Provider, MD  glipiZIDE  (GLUCOTROL XL) 10 MG 24 hr tablet Take 10 mg by mouth daily.    Yes Historical Provider, MD  HUMALOG MIX 75/25 KWIKPEN (75-25) 100 UNIT/ML SUSP Inject 15 Units into the skin every morning. 09/01/11  Yes Historical Provider, MD  insulin glargine (LANTUS) 100 UNIT/ML injection Inject 35 Units into the skin daily.   Yes Historical Provider, MD  Magnesium 250 MG TABS Take 1 tablet by mouth daily.   Yes Historical Provider, MD  montelukast (SINGULAIR) 10 MG tablet Take 10 mg by mouth at bedtime.   Yes Historical Provider, MD  Omega-3 Fatty Acids (FISH OIL) 1000 MG CAPS Take 1 capsule by mouth daily.   Yes Historical Provider, MD  polyethylene glycol (MIRALAX / GLYCOLAX) packet Take 17 g by mouth daily.   Yes Historical Provider, MD  potassium chloride SA (K-DUR,KLOR-CON) 20 MEQ tablet Take 20 mEq by mouth daily.   Yes Historical Provider, MD  prochlorperazine (COMPAZINE) 5 MG tablet Take 5 mg by mouth every 6 (six) hours as needed for nausea.   Yes Historical Provider, MD  Propylene Glycol (SYSTANE BALANCE OP) Apply 1 drop to eye 2 (two) times daily.   Yes Historical Provider, MD  pyridOXINE (VITAMIN B-6) 100 MG tablet Take 100 mg by mouth daily.   Yes Historical Provider, MD  simvastatin (ZOCOR) 20 MG tablet Take 20 mg by mouth every evening.   Yes Historical Provider, MD  vitamin B-12 (CYANOCOBALAMIN) 1000 MCG tablet Take 1,000 mcg by mouth daily.   Yes Historical Provider, MD  vitamin E 400 UNIT capsule Take 400 Units by mouth daily.   Yes Historical Provider, MD    Scheduled Meds: . albuterol  2.5 mg Nebulization QID  . budesonide  0.25 mg Nebulization BID  . folic acid  A999333 mcg Oral Daily  . furosemide  80 mg Oral Daily  . insulin aspart  0-15 Units Subcutaneous TID WC  . insulin detemir  20 Units Subcutaneous QHS  . montelukast  10 mg Oral QHS  . polyethylene glycol  17 g Oral Daily  . polyvinyl alcohol   Both Eyes BID  . simvastatin  20 mg Oral QPM   Infusions: . 0.9 % NaCl with KCl 20  mEq / L 50 mL/hr at 08/13/12 0412   PRN Meds: acetaminophen-codeine, alum & mag hydroxide-simeth, ondansetron (ZOFRAN) IV, ondansetron, prochlorperazine, zolpidem   Allergies as of 08/12/2012  . (No Known Allergies)    Family History  Problem Relation Age of Onset  . Breast cancer Maternal Aunt     History   Social History  . Marital Status: Single    Spouse Name: N/A    Number of Children: 0  .  Years of Education: N/A   Occupational History  . Retired     Astronomer   Social History Main Topics  . Smoking status: Former Smoker -- 2.00 packs/day for 5 years    Types: Cigarettes    Quit date: 01/10/1958  . Smokeless tobacco: Never Used  . Alcohol Use: No     Comment: Quit when she quit smoking  . Drug Use: No  . Sexually Active: Not on file   Other Topics Concern  . Not on file   Social History Narrative   Retired from [employer] no children   Sister in [place]: Constitutional:  No new weakness or fatigue.  Weight down about 10 # in last few months.  ENT:  No nose bleeds, no oral bleeding Pulm:  DOE worse sometimes, lately stable .  Is set up for sleep study on 8/27.  No home oxygen  CV:  No chest pain.  No palps.  Chronic (years) LE edema GU:  No blood in urine, no oliguria or polyuria GI:  Per HPI.  No heartburn, no anorexia, no dysphagia.  No constipation Heme:  Per HPI.    Transfusions:  Per HPI Neuro:  No headache, no blurry vision. Derm:  No rash or sores.  No pruritus Endocrine:  No sweats, chills. Immunization:  No queried Travel:  none   PHYSICAL EXAM: Vital signs in last 24 hours: Temp:  [98.3 F (36.8 C)-98.6 F (37 C)] 98.5 F (36.9 C) (08/04 1315) Pulse Rate:  [58-80] 68 (08/04 1315) Resp:  [20-30] 20 (08/04 1315) BP: (148-223)/(55-70) 174/58 mmHg (08/04 1315) SpO2:  [96 %-100 %] 99 % (08/04 1315) Weight:  [106.2 kg (234 lb 2.1 oz)] 106.2 kg (234 lb 2.1 oz) (08/04 0318)  General: obese, pleasant,  comfortable AAF who is dyspneic with speech Head:  Some facial swelling vs obesity,   Eyes:  + periorbital edema Ears:  Not HOH  Nose:  No discharge Mouth:  Clear, moist, fair dentition Neck:  no JVD, no bruits Lungs:  + expiratory wheexing.  + dyspnea with speach Heart: RRR, 2/6 SEM. Abdomen:  Obese, soft.  No masses, no HSM.  No bruits.   Rectal: brown stool, no gross blood but small spec of FOB+ stool.  External hemorrhoid is not thrombosed and not ulcerated   Musc/Skeltl: no joint contracture Extremities:  + 2 to 3 plus brawny, tense, woody edema from knees down.  No foot ulcers  Neurologic:  Oriented x 3.  Mentation and speech is slow.  Oriented x 3 but story changes.   Skin:  LE per above Tattoos:  none Nodes:  No cervical adenopathy   Psych:  Pleasant, relaxed.   Intake/Output from previous day: 08/03 0701 - 08/04 0700 In: 240 [P.O.:240] Out: -  Intake/Output this shift: Total I/O In: 480 [P.O.:480] Out: -   LAB RESULTS:    Ref. Range 02/10/2011 04:55 04/08/2011 22:58 12/16/2011 15:41 08/12/2012 23:50 08/13/2012 05:00  WBC Latest Range: 4.0-10.5 K/uL 7.8 9.0 7.5 8.6 8.3  RBC Latest Range: 3.87-5.11 MIL/uL 2.99 (L) 3.29 (L) 3.33 (L) 3.04 (L) 2.75 (L)  Hemoglobin Latest Range: 12.0-15.0 g/dL 8.8 (L) 9.8 (L) 9.4 (L) 8.8 (L) 8.0 (L)  HCT Latest Range: 36.0-46.0 % 26.3 (L) 27.8 (L) 28.5 (L) 25.7 (L) 23.3 (L)  MCV Latest Range: 78.0-100.0 fL 88.0 84.5 85.6 84.5 84.7  MCH Latest Range: 26.0-34.0 pg 29.4 29.8 28.2 28.9 29.1  MCHC Latest Range: 30.0-36.0 g/dL  33.5 35.3 33.0 34.2 34.3  RDW Latest Range: 11.5-15.5 % 14.5 14.5 15.2 16.0 (H) 16.1 (H)  Platelets Latest Range: 150-400 K/uL 201 199 221 244 222  Neutrophils Relative % Latest Range: 43-77 %   71       BMET Lab Results  Component Value Date   NA 141 08/13/2012   NA 142 08/12/2012   NA 140 07/23/2012   K 4.0 08/13/2012   K 4.2 08/12/2012   K 4.5 07/23/2012   CL 109 08/13/2012   CL 110 08/12/2012   CL 112 07/23/2012   CO2 21  08/13/2012   CO2 21 08/12/2012   CO2 23 07/23/2012   GLUCOSE 91 08/13/2012   GLUCOSE 117* 08/12/2012   GLUCOSE 226* 07/23/2012   BUN 37* 08/13/2012   BUN 41* 08/12/2012   BUN 36* 07/23/2012   CREATININE 1.39* 08/13/2012   CREATININE 1.43* 08/12/2012   CREATININE 1.4* 07/23/2012   CALCIUM 8.0* 08/13/2012   CALCIUM 8.3* 08/12/2012   CALCIUM 8.7 07/23/2012   LFT  Recent Labs  08/12/12 2350  PROT 6.5  ALBUMIN 2.9*  AST 16  ALT 22  ALKPHOS 81  BILITOT 0.2*   PT/INR Lab Results  Component Value Date   INR 1.16 08/12/2012   Transthoracic Echocardiography 07/27/2012 Study Conclusions - Left ventricle: The cavity size was normal. Wall thickness was increased in a pattern of moderate LVH. The estimated ejection fraction was 65%. Wall motion was normal; there were no regional wall motion abnormalities. - Aortic valve: The valve appears to be grossly normal. Mild regurgitation. - Mitral valve: Mild regurgitation. - Left atrium: The atrium was moderately dilated. - Right ventricle: The cavity size was normal. Systolic function was normal. - Tricuspid valve: Mild regurgitation. - Pulmonary arteries: PA peak pressure: 6mm Hg (S).   RADIOLOGY STUDIES: No results found.  ENDOSCOPIC STUDIES: 07/2007 colonoscopy Diverticulosis and 57mm adenoma.   09/2003 Colonoscopy To cecum. Internal hemorrhoids, no recurrent polyps   11/1999 Colonoscopy Adenomatous polyp removed. Diverticulosis, internal hemorrhoids.   07/1999 EGD Esophagitis, GERD.   IMPRESSION: *  Rectal bleeding:  Rule out diverticular (most likely), hemorrhoidal, neoplasia/recurrent adenomatous polyp *  Acute on chronic anemia. Hgb not far from baselines of last few years.  *  Asthma, COPD *  IDDM *  Chronic kidney disease. GFR 48.  Does not seem severe enough to be causing anemia but does have several comorbidities that could be causing anemia of chronic disease.   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  Split dose  Movi Prep tonite and in AM.  *  CBC in AM. *  Has she had TSH check in last several months?  If not, would be worth obtaining, as this can contribute to Anemia.    LOS: 1 day   Azucena Freed  08/13/2012, 3:05 PM Pager: [phone]      Attending physician's note   I have taken a history, examined the patient and reviewed the chart. I agree with the Advanced Practitioner's note, impression and recommendations. Acute LGI bleeding with a chronic anemia. Extensive diverticulosis, hemorrhoids and an adenomatous colon polyp on colonoscopy in 2009. Colonoscopy tomorrow for further evaluation. The risk of the procedure and sedation is increased due to her pulmonary difficulties. Monitor Hb/Hct.  Ladene Artist, MD [facility]

## 2012-08-13 NOTE — Progress Notes (Signed)
Attempted to call ED nurse to receive report. ED nurse unavailable at this time. Per unit secretary, nurse will call back when available. Velora Mediate

## 2012-08-13 NOTE — H&P (Signed)
[name] is an 72 y.o. female.   Chief Complaint: having rectal bleeding HPI:  Patient is a 72 year old woman of African American descent who has had persistent rectal bleeding for the past 2-3 days. The bleeding has gradually been worsening and in the past 24-hour she said several episodes of moderate volume bright red blood per rectum. This has not been associated with abdominal pain, nausea, fever, chills, lightheadedness, or chest discomfort. She has had mild dyspnea on exertion with cough. She has no history of peptic ulcer disease or significant lower GI bleed. She is closely followed by her pulmonary specialist because of asthma and nocturnal hypoxia.  Past Medical History  Diagnosis Date  . Arthritis   . Lumbar disc disease   . Asthma   . Depression   . Diabetes mellitus   . GERD (gastroesophageal reflux disease)   . Hypercholesterolemia   . HTN (hypertension)   . Peptic ulcer disease   . Valgus foot     right   . Gastric ulcer   . Adenomatous colon polyp   . Diverticulosis   . Internal hemorrhoids   . 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 Anemia   . Umbilical hernia   . 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 acetaminophen-codeine (TYLENOL #3) 300-30 MG per tablet Take 1 tablet by mouth every 4 (four) hours as needed for pain.      Marland Kitchen albuterol (PROAIR HFA) 108 (90 BASE) MCG/ACT inhaler Inhale 2 puffs into the lungs every 6 (six) hours as needed for wheezing.      Marland Kitchen albuterol (PROVENTIL) (2.5 MG/3ML) 0.083% nebulizer solution Take 3 mLs (2.5 mg total) by nebulization 4 (four) times daily.  75 mL  12  . amLODipine (NORVASC) 5 MG tablet Take 10 mg by mouth daily.       . Ascorbic Acid (VITAMIN C) 1000 MG tablet Take 1,000 mg by mouth daily.      Marland Kitchen aspirin EC 81 MG tablet Take 81 mg by mouth daily.      Marland Kitchen augmented betamethasone dipropionate (DIPROLENE-AF) 0.05 % cream Apply 1 application topically 2  (two) times daily. To both legs      . budesonide (PULMICORT) 0.25 MG/2ML nebulizer solution Take 2 mLs (0.25 mg total) by nebulization 2 (two) times daily.  60 mL  12  . Calcium Carbonate-Vitamin D (CALTRATE 600+D) 600-400 MG-UNIT per tablet Take 1 tablet by mouth daily.      Marland Kitchen esomeprazole (NEXIUM) 40 MG capsule Take 40 mg by mouth daily before breakfast.       . ferrous sulfate 325 (65 FE) MG tablet Take 325 mg by mouth daily with breakfast.      . fluticasone (FLONASE) 50 MCG/ACT nasal spray Place 2 sprays into the nose daily.       . folic acid (FOLVITE) A999333 MCG tablet Take 400 mcg by mouth daily.      . furosemide (LASIX) 40 MG tablet Take 80 mg by mouth daily.       Marland Kitchen glipiZIDE (GLUCOTROL XL) 10 MG 24 hr tablet Take 10 mg by mouth daily.       Marland Kitchen HUMALOG MIX 75/25 KWIKPEN (75-25) 100 UNIT/ML SUSP Inject 15 Units into the skin every morning.      . insulin glargine (LANTUS) 100 UNIT/ML injection Inject 35 Units into the skin daily.      . Magnesium 250 MG  TABS Take 1 tablet by mouth daily.      . montelukast (SINGULAIR) 10 MG tablet Take 10 mg by mouth at bedtime.      . Omega-3 Fatty Acids (FISH OIL) 1000 MG CAPS Take 1 capsule by mouth daily.      . polyethylene glycol (MIRALAX / GLYCOLAX) packet Take 17 g by mouth daily.      . potassium chloride SA (K-DUR,KLOR-CON) 20 MEQ tablet Take 20 mEq by mouth daily.      . prochlorperazine (COMPAZINE) 5 MG tablet Take 5 mg by mouth every 6 (six) hours as needed for nausea.      . Propylene Glycol (SYSTANE BALANCE OP) Apply 1 drop to eye 2 (two) times daily.      Marland Kitchen pyridOXINE (VITAMIN B-6) 100 MG tablet Take 100 mg by mouth daily.      . simvastatin (ZOCOR) 20 MG tablet Take 20 mg by mouth every evening.      . vitamin B-12 (CYANOCOBALAMIN) 1000 MCG tablet Take 1,000 mcg by mouth daily.      . vitamin E 400 UNIT capsule Take 400 Units by mouth daily.        ADDITIONAL HOME MEDICATIONS: No additional medications  PHYSICIANS INVOLVED IN CARE:  Burnard Bunting, Chesley Mires, Doristine Devoid  Past Surgical History  Procedure Laterality Date  . Total knee arthroplasty      Bilateral  . Incise and drain abcess      Abdominal Wall  . Umbilical hernia repair  2007    incarcerated  . Lipoma resection      Right Neck  . Colonoscopy  2209 or 2010     2001, 2005, 2009  colonoscopy.  [place]  . Upper gastrointestinal endoscopy  2001    medoff Grade A esophagitis    Family History  Problem Relation Age of Onset  . Breast cancer Maternal Aunt      Social History:  reports that she quit smoking about 54 years ago. Her smoking use included Cigarettes. She has a 10 pack-year smoking history. She has never used smokeless tobacco. She reports that she does not drink alcohol or use illicit drugs.  Allergies: No Known Allergies   ROS: anemia, arthritis, asthma, diabetes, heart murmur, high blood pressure and shortness of breath  PHYSICAL EXAM: Blood pressure 114/77, pulse 77, temperature 98.3 F (36.8 C), temperature source Oral, resp. rate 22, height 5\' 4"  (1.626 m), weight 106.2 kg (234 lb 2.1 oz), SpO2 96.00%.  in general, she is an overweight black woman who was in no apparent distress while lying partially upright in bed on nasal cannula oxygen. HEENT exam was within normal limits, neck was supple without jugular venous distention or carotid bruit, chest was clear to auscultation, heart had a regular rate and rhythm with a systolic ejection murmur grade 2/6 at the left sternal border, abdomen had normal bowel sounds and no tenderness, she had bilateral 1+ leg edema. Neurologic exam was nonfocal  Results for orders placed during the hospital encounter of 08/12/12 (from the past 48 hour(s))  CBC     Status: Abnormal   Collection Time    08/12/12 11:50 PM      Result Value Range   WBC 8.6  4.0 - 10.5 K/uL   RBC 3.04 (*) 3.87 - 5.11 MIL/uL   Hemoglobin 8.8 (*) 12.0 - 15.0 g/dL   HCT 25.7 (*) 36.0 - 46.0 %   MCV  84.5  78.0 - 100.0 fL  MCH 28.9  26.0 - 34.0 pg   MCHC 34.2  30.0 - 36.0 g/dL   RDW 16.0 (*) 11.5 - 15.5 %   Platelets 244  150 - 400 K/uL  COMPREHENSIVE METABOLIC PANEL     Status: Abnormal   Collection Time    08/12/12 11:50 PM      Result Value Range   Sodium 142  135 - 145 mEq/L   Potassium 4.2  3.5 - 5.1 mEq/L   Chloride 110  96 - 112 mEq/L   CO2 21  19 - 32 mEq/L   Glucose, Bld 117 (*) 70 - 99 mg/dL   BUN 41 (*) 6 - 23 mg/dL   Creatinine, Ser 1.43 (*) 0.50 - 1.10 mg/dL   Calcium 8.3 (*) 8.4 - 10.5 mg/dL   Total Protein 6.5  6.0 - 8.3 g/dL   Albumin 2.9 (*) 3.5 - 5.2 g/dL   AST 16  0 - 37 U/L   ALT 22  0 - 35 U/L   Alkaline Phosphatase 81  39 - 117 U/L   Total Bilirubin 0.2 (*) 0.3 - 1.2 mg/dL   GFR calc non Af Amer 36 (*) >90 mL/min   GFR calc Af Amer 42 (*) >90 mL/min   Comment:            The eGFR has been calculated     using the CKD EPI equation.     This calculation has not been     validated in all clinical     situations.     eGFR's persistently     <90 mL/min signify     possible Chronic Kidney Disease.  TYPE AND SCREEN     Status: None   Collection Time    08/12/12 11:50 PM      Result Value Range   ABO/RH(D) B POS     Antibody Screen NEG     Sample Expiration 08/15/2012    PROTIME-INR     Status: None   Collection Time    08/12/12 11:50 PM      Result Value Range   Prothrombin Time 14.6  11.6 - 15.2 seconds   INR 1.16  0.00 - 1.49  TROPONIN I     Status: None   Collection Time    08/12/12 11:50 PM      Result Value Range   Troponin I <0.30  <0.30 ng/mL   Comment:            Due to the release kinetics of cTnI,     a negative result within the first hours     of the onset of symptoms does not rule out     myocardial infarction with certainty.     If myocardial infarction is still suspected,     repeat the test at appropriate intervals.  PRO B NATRIURETIC PEPTIDE     Status: Abnormal   Collection Time    08/12/12 11:50 PM      Result Value  Range   Pro B Natriuretic peptide (BNP) 293.0 (*) 0 - 125 pg/mL   No results found.  12-lead EKG showed narrow complex beats with significant artifact. It is difficult to tell if she had atrial fibrillation versus a sinus rhythm.  Assessment/Plan #1 Rectal Bleed and Anemia: She appears to have a moderate lower GI bleed. This most likely from diverticular bleeding versus hemorrhoidal bleeding. This volume of bleeding is less likely from colonic neoplasm or other source. An upper GI source  is unlikely given the relatively normal BUN level. We will continue her proton pump inhibitor treatment and request a GI specialist consultation. #2 Palpitations: We will recheck a 12-lead EKG to see if she has atrial fibrillation. In addition she will have 24-hour telemetry done. #3 HTN: Well controlled on current medications. #4 DM-2:  Stable on insulin which will be continued.   Cliffard Hair G 08/13/2012, 3:22 AM

## 2012-08-13 NOTE — Progress Notes (Addendum)
Hypoglycemic Event  CBG: 59   Treatment: 15 GM carbohydrate snack  Symptoms: Hungry  Follow-up CBG: Time: 3:33 CBG Result: 63  Possible Reasons for Event: Inadequate meal intake  CBG: 63  Treatment: 15 GM carbohydrate snack  Symptoms: Hungry  Follow-up CBG: Time: 3:45 CBG Result:83  Possible Reasons for Event: Inadequate meal intake   Velora Mediate  Remember to initiate Hypoglycemia Order Set & complete

## 2012-08-13 NOTE — Progress Notes (Signed)
Received report from Randall Hiss, ED RN. Per RN, will send pt up as soon as Sharlett Iles MD is complete with his assessment at her bedside. Velora Mediate

## 2012-08-13 NOTE — Progress Notes (Signed)
Pt orientation to unit, room and routine. Information packet given to patient.  Admission INP armband ID verified with patient, and in place. SR up x 2, fall risk assessment complete with Patient and family verbalizing understanding of risks associated with falls. Pt verbalizes an understanding of how to use the call bell and to call for help before getting out of bed.    Will cont to monitor and assist as needed.  Velora Mediate, RN 08/13/2012 3:17 AM

## 2012-08-14 ENCOUNTER — Encounter (HOSPITAL_COMMUNITY)
Admission: EM | Disposition: A | Discharge status: Home / Self Care | Payer: Self-pay | Source: Home / Self Care | Attending: Emergency Medicine

## 2012-08-14 ENCOUNTER — Encounter (HOSPITAL_COMMUNITY): Payer: Self-pay | Admitting: Gastroenterology

## 2012-08-14 DIAGNOSIS — D126 Benign neoplasm of colon, unspecified: Secondary | ICD-10-CM

## 2012-08-14 HISTORY — PX: COLONOSCOPY: SHX5424

## 2012-08-14 LAB — CBC
HCT: 26.8 % — ABNORMAL LOW (ref 36.0–46.0)
Hemoglobin: 8.9 g/dL — ABNORMAL LOW (ref 12.0–15.0)
MCH: 28.9 pg (ref 26.0–34.0)
MCV: 87 fL (ref 78.0–100.0)
Platelets: 259 10*3/uL (ref 150–400)
RBC: 3.08 MIL/uL — ABNORMAL LOW (ref 3.87–5.11)
WBC: 7.3 10*3/uL (ref 4.0–10.5)

## 2012-08-14 LAB — GLUCOSE, CAPILLARY
Glucose-Capillary: 67 mg/dL — ABNORMAL LOW (ref 70–99)
Glucose-Capillary: 73 mg/dL (ref 70–99)
Glucose-Capillary: 113 mg/dL — ABNORMAL HIGH (ref 70–99)

## 2012-08-14 SURGERY — COLONOSCOPY
Anesthesia: Moderate Sedation

## 2012-08-14 MED ORDER — SODIUM CHLORIDE 0.9 % IV SOLN
INTRAVENOUS | Status: DC
  Administered 2012-08-14: 250 mL via INTRAVENOUS

## 2012-08-14 MED ORDER — FENTANYL CITRATE 0.05 MG/ML IJ SOLN
INTRAMUSCULAR | Status: AC
  Filled 2012-08-14: qty 2

## 2012-08-14 MED ORDER — FENTANYL CITRATE 0.05 MG/ML IJ SOLN
INTRAMUSCULAR | Status: DC | PRN
  Administered 2012-08-14 (×3): 25 ug via INTRAVENOUS

## 2012-08-14 MED ORDER — MIDAZOLAM HCL 5 MG/5ML IJ SOLN
INTRAMUSCULAR | Status: DC | PRN
  Administered 2012-08-14 (×2): 2 mg via INTRAVENOUS
  Administered 2012-08-14: 1 mg via INTRAVENOUS

## 2012-08-14 MED ORDER — AMLODIPINE BESYLATE 10 MG PO TABS
10.0000 mg | ORAL_TABLET | Freq: Every day | ORAL | Status: DC
  Administered 2012-08-15 (×2): 10 mg via ORAL
  Filled 2012-08-14 (×2): qty 1

## 2012-08-14 MED ORDER — MIDAZOLAM HCL 5 MG/ML IJ SOLN
INTRAMUSCULAR | Status: AC
  Filled 2012-08-14: qty 2

## 2012-08-14 NOTE — Progress Notes (Signed)
[facility] Daily Rounding Note  08/14/2012, 8:47 AM  SUBJECTIVE:       Stool clear after completing second stage of split prep.  Did not see any blood during her prep.  No SOB.  Feels a little nervous.  Did not sleep well.  No pain, no nausea.  OBJECTIVE:         Vital signs in last 24 hours:    Temp:  [97.8 F (36.6 C)-98.6 F (37 C)] 98 F (36.7 C) (08/05 0621) Pulse Rate:  [58-70] 70 (08/05 0621) Resp:  [20-22] 22 (08/05 0621) BP: (148-180)/(50-64) 161/64 mmHg (08/05 0621) SpO2:  [94 %-99 %] 98 % (08/05 0750) Weight:  [108.9 kg (240 lb 1.3 oz)] 108.9 kg (240 lb 1.3 oz) (08/04 2147) Last BM Date: 08/13/12 General: obese, pleasant, not acutely ill   Heart: rrr with systolic murmer Chest: clear bil.  No dyspnea Abdomen: soft, obese, NT, active BS.  No mass or HSM  Extremities: + LE woody edema Neuro/Psych:  Pleasant, oriented x 3.  Relaxed.  Tremor in trunk and head.   Intake/Output from previous day: 08/04 0701 - 08/05 0700 In: 2060 [P.O.:720; I.V.:1340] Out: -   Intake/Output this shift:    Lab Results:  Recent Labs  08/12/12 2350 08/13/12 0500 08/14/12 0500  WBC 8.6 8.3 7.3  HGB 8.8* 8.0* 8.9*  HCT 25.7* 23.3* 26.8*  PLT 244 222 259   BMET  Recent Labs  08/12/12 2350 08/13/12 0500  NA 142 141  K 4.2 4.0  CL 110 109  CO2 21 21  GLUCOSE 117* 91  BUN 41* 37*  CREATININE 1.43* 1.39*  CALCIUM 8.3* 8.0*   LFT  Recent Labs  08/12/12 2350  PROT 6.5  ALBUMIN 2.9*  AST 16  ALT 22  ALKPHOS 81  BILITOT 0.2*   PT/INR  Recent Labs  08/12/12 2350  LABPROT 14.6  INR 1.16    ASSESMENT: * Rectal bleeding: Rule out diverticular (most likely), hemorrhoidal, neoplasia/recurrent adenomatous polyp. Completed colonoscopy prep succesfully * Acute on chronic anemia. Hgb stable,  not far from baselines of last few years.  * Asthma, COPD  * IDDM  * Chronic kidney disease. GFR 48. Does not seem severe enough to be causing anemia but does have several  comorbidities that could be causing anemia of chronic disease.  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 I agree with the Advanced Practitioner's note, impression and recommendations. Bleeding has stopped. For colonoscopy today as planned.  Pricilla Riffle. Fuller Plan, MD [facility]

## 2012-08-14 NOTE — Telephone Encounter (Signed)
Error

## 2012-08-14 NOTE — Progress Notes (Signed)
Subjective: No longer having rectal bleeding, no nausea or abdominal pain or dyspnea  Objective: Vital signs in last 24 hours: Temp:  [97.8 F (36.6 C)-98.6 F (37 C)] 98 F (36.7 C) (08/05 0621) Pulse Rate:  [58-70] 70 (08/05 0621) Resp:  [20-22] 22 (08/05 0621) BP: (148-180)/(50-64) 161/64 mmHg (08/05 0621) SpO2:  [94 %-99 %] 98 % (08/05 0750) Weight:  [108.9 kg (240 lb 1.3 oz)] 108.9 kg (240 lb 1.3 oz) (08/04 2147) Weight change: 2.7 kg (5 lb 15.2 oz)   Intake/Output from previous day: 08/04 0701 - 08/05 0700 In: 2060 [P.O.:720; I.V.:1340] Out: -    General appearance: alert, cooperative and no distress Resp: clear to auscultation bilaterally Cardio: regular rate and rhythm GI: soft, non-tender; bowel sounds normal; no masses,  no organomegaly Extremities: bilateral 2+ leg edema  Lab Results:  Recent Labs  08/13/12 0500 08/14/12 0500  WBC 8.3 7.3  HGB 8.0* 8.9*  HCT 23.3* 26.8*  PLT 222 259   BMET  Recent Labs  08/12/12 2350 08/13/12 0500  NA 142 141  K 4.2 4.0  CL 110 109  CO2 21 21  GLUCOSE 117* 91  BUN 41* 37*  CREATININE 1.43* 1.39*  CALCIUM 8.3* 8.0*   CMET CMP     Component Value Date/Time   NA 141 08/13/2012 0500   K 4.0 08/13/2012 0500   CL 109 08/13/2012 0500   CO2 21 08/13/2012 0500   GLUCOSE 91 08/13/2012 0500   BUN 37* 08/13/2012 0500   CREATININE 1.39* 08/13/2012 0500   CREATININE 1.56* 09/20/2011 1800   CALCIUM 8.0* 08/13/2012 0500   PROT 6.5 08/12/2012 2350   ALBUMIN 2.9* 08/12/2012 2350   AST 16 08/12/2012 2350   ALT 22 08/12/2012 2350   ALKPHOS 81 08/12/2012 2350   BILITOT 0.2* 08/12/2012 2350   GFRNONAA 37* 08/13/2012 0500   GFRAA 43* 08/13/2012 0500    CBG (last 3)   Recent Labs  08/13/12 2152 08/14/12 0755 08/14/12 0829  GLUCAP 178* 67* 103*    INR RESULTS:   Lab Results  Component Value Date   INR 1.16 08/12/2012   INR 1.04 07/14/2009   INR 1.13 06/16/2009     Studies/Results: No results found.  Medications: I have reviewed the  patient's current medications.  Assessment/Plan: #1 Rectal Bleeding: likely diverticular and stable. Colonoscopy today, continue to monitor CBCs, and likely discharge home tomorrow. #2 HTN: improved with addition of amlodipine to regimen  LOS: 2 days   [name] G 08/14/2012, 8:52 AM

## 2012-08-14 NOTE — Discharge Summary (Signed)
Physician Discharge Summary  Patient ID: [name] MRN: [id number] DOB/AGE: 1940-09-04 72 y.o.  Admit date: 08/12/2012 Discharge date: 08/14/2012   Discharge Diagnoses:  Principal Problem:   Rectal bleeding Active Problems:   Anemia   COPD with asthma   Acute posthemorrhagic anemia   Diverticulosis of colon (without mention of hemorrhage)   Benign neoplasm of colon   Discharged Condition: good  Hospital Course:   Patient is a 72 year old woman of African American descent who has had persistent rectal bleeding for the past 2-3 days. The bleeding has gradually been worsening and in the past 24-hours she has had several episodes of moderate volume bright red blood per rectum. This has not been associated with abdominal pain, nausea, fever, chills, lightheadedness, or chest discomfort. She has had mild dyspnea on exertion with cough. She has no history of peptic ulcer disease or significant lower GI bleed. She is closely followed by her pulmonary specialist because of asthma and nocturnal hypoxia.  She was seen by a GI specialist, Dr. Lucio Edward, who did a colonoscopy on her on 08/14/12 that showed a 6 mm sessile polyp in the cecum with polypectomy done. Moderate diverticulosis was also seen in the sigmoid and descending colon. Small internal hemorrhoids were seen as well. It was felt most likely that her GI bleed was from diverticular hemorrhage. Her bleeding stopped and her hemoglobin and hemaatocrit stabilized. She did not need a transfusion of PRBCs. On the day of discharge she felt fine.   On the day of discharge she felt fine with no dyspnea, lightheadedness, or further rectal bleeding. She had a dry cough and no nausea.  Consults: GI  Significant Diagnostic Studies:  No results found. Colonoscopy Labs: Lab Results  Component Value Date   WBC 7.3 08/14/2012   HGB 8.9* 08/14/2012   HCT 26.8* 08/14/2012   MCV 87.0 08/14/2012   PLT 259 08/14/2012     Recent Labs Lab 08/12/12 2350  08/13/12 0500  NA 142 141  K 4.2 4.0  CL 110 109  CO2 21 21  BUN 41* 37*  CREATININE 1.43* 1.39*  CALCIUM 8.3* 8.0*  PROT 6.5  --   BILITOT 0.2*  --   ALKPHOS 81  --   ALT 22  --   AST 16  --   GLUCOSE 117* 91       Lab Results  Component Value Date   INR 1.16 08/12/2012   INR 1.04 07/14/2009   INR 1.13 06/16/2009     No results found for this or any previous visit (from the past 240 hour(s)).    Discharge Exam: Blood pressure 186/65, pulse 58, temperature 98.2 F (36.8 C), temperature source Oral, resp. rate 22, height 5\' 4"  (1.626 m), weight 108.9 kg (240 lb 1.3 oz), SpO2 100.00%.  Physical Exam: In general, she is an overweight black woman who was in no apparent distress. HEENT exam was significant for mild left ptosis, neck was supple and without JVD or bruit, chest was clear, heart had a regular rate and rhythm with a 1/6 SEM, abdomen was benign, extremities had bilateral 2+ brawny edema, she was alert and well oriented and could move all extremities well.  Disposition:  She will be discharged from the hospital to home. It was emphasized that she should have no aspirin or NSAIDS for the next 2 weeks. She could use Tylenol as needed for pain.   Discharge Orders   Future Appointments Provider Department Dept Phone   09/05/2012 8:00  PM [facility] [place] [facility] [place]   10/04/2012 2:15 PM Chesley Mires, MD [facility] [phone]   Future Orders Complete By Expires     Call MD for:  As directed     Comments:      Call for recurrent rectal bleeding, dizziness, difficulty breathing, abdominal pain, or other concerning symptoms.    Diet - low sodium heart healthy  As directed     Discharge instructions  As directed     Comments:      She will be discharged to home and should call Dr. Jacquiline Doe office tomorrow to set up a after hospitalization visit within 1 week of discharge from the hospital. Do not have any aspirin or  nonsteroidal antiinflammatory drugs such as aleve, motrin, advil, or ibuprofen for 2 weeks after discharge.    Increase activity slowly  As directed         Medication List    STOP taking these medications       aspirin EC 81 MG tablet      TAKE these medications       acetaminophen-codeine 300-30 MG per tablet  Commonly known as:  TYLENOL #3  Take 1 tablet by mouth every 4 (four) hours as needed for pain.     albuterol 108 (90 BASE) MCG/ACT inhaler  Commonly known as:  PROAIR HFA  Inhale 2 puffs into the lungs every 6 (six) hours as needed for wheezing.     albuterol (2.5 MG/3ML) 0.083% nebulizer solution  Commonly known as:  PROVENTIL  Take 3 mLs (2.5 mg total) by nebulization 4 (four) times daily.     amLODipine 5 MG tablet  Commonly known as:  NORVASC  Take 10 mg by mouth daily.     augmented betamethasone dipropionate 0.05 % cream  Commonly known as:  DIPROLENE-AF  Apply 1 application topically 2 (two) times daily. To both legs     budesonide 0.25 MG/2ML nebulizer solution  Commonly known as:  PULMICORT  Take 2 mLs (0.25 mg total) by nebulization 2 (two) times daily.     CALTRATE 600+D 600-400 MG-UNIT per tablet  Generic drug:  Calcium Carbonate-Vitamin D  Take 1 tablet by mouth daily.     esomeprazole 40 MG capsule  Commonly known as:  NEXIUM  Take 40 mg by mouth daily before breakfast.     ferrous sulfate 325 (65 FE) MG tablet  Take 325 mg by mouth daily with breakfast.     Fish Oil 1000 MG Caps  Take 1 capsule by mouth daily.     fluticasone 50 MCG/ACT nasal spray  Commonly known as:  FLONASE  Place 2 sprays into the nose daily.     folic acid A999333 MCG tablet  Commonly known as:  FOLVITE  Take 400 mcg by mouth daily.     furosemide 40 MG tablet  Commonly known as:  LASIX  Take 80 mg by mouth daily.     glipiZIDE 10 MG 24 hr tablet  Commonly known as:  GLUCOTROL XL  Take 10 mg by mouth daily.     HUMALOG MIX 75/25 KWIKPEN (75-25) 100 UNIT/ML  Supn  Generic drug:  Insulin Lispro Prot & Lispro  Inject 15 Units into the skin every morning.     insulin glargine 100 UNIT/ML injection  Commonly known as:  LANTUS  Inject 35 Units into the skin daily.     Magnesium 250 MG Tabs  Take 1  tablet by mouth daily.     montelukast 10 MG tablet  Commonly known as:  SINGULAIR  Take 10 mg by mouth at bedtime.     polyethylene glycol packet  Commonly known as:  MIRALAX / GLYCOLAX  Take 17 g by mouth daily.     potassium chloride SA 20 MEQ tablet  Commonly known as:  K-DUR,KLOR-CON  Take 20 mEq by mouth daily.     prochlorperazine 5 MG tablet  Commonly known as:  COMPAZINE  Take 5 mg by mouth every 6 (six) hours as needed for nausea.     pyridOXINE 100 MG tablet  Commonly known as:  VITAMIN B-6  Take 100 mg by mouth daily.     simvastatin 20 MG tablet  Commonly known as:  ZOCOR  Take 20 mg by mouth every evening.     SYSTANE BALANCE OP  Apply 1 drop to eye 2 (two) times daily.     vitamin B-12 1000 MCG tablet  Commonly known as:  CYANOCOBALAMIN  Take 1,000 mcg by mouth daily.     vitamin C 1000 MG tablet  Take 1,000 mg by mouth daily.     vitamin E 400 UNIT capsule  Take 400 Units by mouth daily.           Follow-up Information   Follow up with Geoffery Lyons, MD.   Contact information:   [facility] [place] [address] [phone]       Follow up with ARONSON,RICHARD A, MD. Schedule an appointment as soon as possible for a visit in 1 week.   Contact information:   [facility] [place] [address] [phone]       Signed: Donnajean Lopes 08/14/2012, 11:07 PM

## 2012-08-14 NOTE — H&P (View-Only) (Signed)
[facility] Consult: 3:05 PM 08/13/2012   Referring Provider: Leanna Battles MD Primary Care Physician:  Geoffery Lyons, MD Primary Gastroenterologist:  Dr. Silvano Rusk.  Previously Dr. Earlean Shawl  Reason for Consultation:  Painless Hematochezia  HPI: [name] is a diabetic 72 y.o. female with COPD/asthma.   Had painless hematochezia and hgb of 7.9 in late 01/2011. This was attributed to diverticulosis.  Her last colonoscopy was in 2009 when she had adenomatous polyp and diverticulosis.  Was due for follow up surveillance colonoscopy in 07/2012.  This has not yet taken place.    Admitted last night with rectal bleeding.  It started on Friday 8/1 as blood with wiping after brown stool, same on Saturday.  Midday yesterday passed about 3 large stools of pure blood, there was no abdominal or rectal pain.  Last BM this AM was not bloody.  She has no nausea, no weakness or dizziness.  No new anticaogulant meds, takes only 81 ASA daily.  No Advil etc. Takes po Iron once daily at home.   Stable DOE.  No chest pain.    hgb in 12/2011 was 9.1.  Last night it was 8.8, today it is 8.0.  Coags are normal.   Past Medical History  Diagnosis Date  . Arthritis   . Lumbar disc disease   . Asthma   . Depression   . Diabetes mellitus   . GERD (gastroesophageal reflux disease)   . Hypercholesterolemia   . HTN (hypertension)   . Peptic ulcer disease   . Valgus foot     right   . Gastric ulcer   . Adenomatous colon polyp   . Diverticulosis   . Internal hemorrhoids   . 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 Anemia   . Umbilical hernia   . 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 Total knee arthroplasty      Bilateral  . Incise and drain abcess      Abdominal Wall  . Umbilical hernia repair  2007    incarcerated  . Lipoma resection      Right Neck  . Colonoscopy  2209 or 2010     2001,  2005, 2009  colonoscopy.  [place]  . Upper gastrointestinal endoscopy  2001    medoff Grade A esophagitis    Prior to Admission medications   Medication Sig Start Date End Date Taking? Authorizing Provider  acetaminophen-codeine (TYLENOL #3) 300-30 MG per tablet Take 1 tablet by mouth every 4 (four) hours as needed for pain.   Yes Historical Provider, MD  albuterol (PROAIR HFA) 108 (90 BASE) MCG/ACT inhaler Inhale 2 puffs into the lungs every 6 (six) hours as needed for wheezing. 04/20/11 07/23/13 Yes Chesley Mires, MD  albuterol (PROVENTIL) (2.5 MG/3ML) 0.083% nebulizer solution Take 3 mLs (2.5 mg total) by nebulization 4 (four) times daily. 10/13/11  Yes Chesley Mires, MD  amLODipine (NORVASC) 5 MG tablet Take 10 mg by mouth daily.    Yes Historical Provider, MD  Ascorbic Acid (VITAMIN C) 1000 MG tablet Take 1,000 mg by mouth daily.   Yes Historical Provider, MD  aspirin EC 81 MG tablet Take 81 mg by mouth daily.   Yes Historical Provider, MD  augmented betamethasone dipropionate (DIPROLENE-AF) 0.05 % cream Apply 1 application topically 2 (two) times daily. To both legs   Yes Historical Provider, MD  budesonide (PULMICORT) 0.25 MG/2ML nebulizer solution Take 2 mLs (0.25  mg total) by nebulization 2 (two) times daily. 11/15/11  Yes Chesley Mires, MD  Calcium Carbonate-Vitamin D (CALTRATE 600+D) 600-400 MG-UNIT per tablet Take 1 tablet by mouth daily.   Yes Historical Provider, MD  esomeprazole (NEXIUM) 40 MG capsule Take 40 mg by mouth daily before breakfast.    Yes Historical Provider, MD  ferrous sulfate 325 (65 FE) MG tablet Take 325 mg by mouth daily with breakfast.   Yes Historical Provider, MD  fluticasone (FLONASE) 50 MCG/ACT nasal spray Place 2 sprays into the nose daily.  03/14/12  Yes Chesley Mires, MD  folic acid (FOLVITE) A999333 MCG tablet Take 400 mcg by mouth daily.   Yes Historical Provider, MD  furosemide (LASIX) 40 MG tablet Take 80 mg by mouth daily.    Yes Historical Provider, MD  glipiZIDE  (GLUCOTROL XL) 10 MG 24 hr tablet Take 10 mg by mouth daily.    Yes Historical Provider, MD  HUMALOG MIX 75/25 KWIKPEN (75-25) 100 UNIT/ML SUSP Inject 15 Units into the skin every morning. 09/01/11  Yes Historical Provider, MD  insulin glargine (LANTUS) 100 UNIT/ML injection Inject 35 Units into the skin daily.   Yes Historical Provider, MD  Magnesium 250 MG TABS Take 1 tablet by mouth daily.   Yes Historical Provider, MD  montelukast (SINGULAIR) 10 MG tablet Take 10 mg by mouth at bedtime.   Yes Historical Provider, MD  Omega-3 Fatty Acids (FISH OIL) 1000 MG CAPS Take 1 capsule by mouth daily.   Yes Historical Provider, MD  polyethylene glycol (MIRALAX / GLYCOLAX) packet Take 17 g by mouth daily.   Yes Historical Provider, MD  potassium chloride SA (K-DUR,KLOR-CON) 20 MEQ tablet Take 20 mEq by mouth daily.   Yes Historical Provider, MD  prochlorperazine (COMPAZINE) 5 MG tablet Take 5 mg by mouth every 6 (six) hours as needed for nausea.   Yes Historical Provider, MD  Propylene Glycol (SYSTANE BALANCE OP) Apply 1 drop to eye 2 (two) times daily.   Yes Historical Provider, MD  pyridOXINE (VITAMIN B-6) 100 MG tablet Take 100 mg by mouth daily.   Yes Historical Provider, MD  simvastatin (ZOCOR) 20 MG tablet Take 20 mg by mouth every evening.   Yes Historical Provider, MD  vitamin B-12 (CYANOCOBALAMIN) 1000 MCG tablet Take 1,000 mcg by mouth daily.   Yes Historical Provider, MD  vitamin E 400 UNIT capsule Take 400 Units by mouth daily.   Yes Historical Provider, MD    Scheduled Meds: . albuterol  2.5 mg Nebulization QID  . budesonide  0.25 mg Nebulization BID  . folic acid  [id number] mcg Oral Daily  . furosemide  80 mg Oral Daily  . insulin aspart  0-15 Units Subcutaneous TID WC  . insulin detemir  20 Units Subcutaneous QHS  . montelukast  10 mg Oral QHS  . polyethylene glycol  17 g Oral Daily  . polyvinyl alcohol   Both Eyes BID  . simvastatin  20 mg Oral QPM   Infusions: . 0.9 % NaCl with KCl 20  mEq / L 50 mL/hr at 08/13/12 0412   PRN Meds: acetaminophen-codeine, alum & mag hydroxide-simeth, ondansetron (ZOFRAN) IV, ondansetron, prochlorperazine, zolpidem   Allergies as of 08/12/2012  . (No Known Allergies)    Family History  Problem Relation Age of Onset  . Breast cancer Maternal Aunt     History   Social History  . Marital Status: Single    Spouse Name: N/A    Number of Children: 0  .  Years of Education: N/A   Occupational History  . Retired     Astronomer   Social History Main Topics  . Smoking status: Former Smoker -- 2.00 packs/day for 5 years    Types: Cigarettes    Quit date: 01/10/1958  . Smokeless tobacco: Never Used  . Alcohol Use: No     Comment: Quit when she quit smoking  . Drug Use: No  . Sexually Active: Not on file   Other Topics Concern  . Not on file   Social History Narrative   Retired from [employer] no children   Sister in [place]: Constitutional:  No new weakness or fatigue.  Weight down about 10 # in last few months.  ENT:  No nose bleeds, no oral bleeding Pulm:  DOE worse sometimes, lately stable .  Is set up for sleep study on 8/27.  No home oxygen  CV:  No chest pain.  No palps.  Chronic (years) LE edema GU:  No blood in urine, no oliguria or polyuria GI:  Per HPI.  No heartburn, no anorexia, no dysphagia.  No constipation Heme:  Per HPI.    Transfusions:  Per HPI Neuro:  No headache, no blurry vision. Derm:  No rash or sores.  No pruritus Endocrine:  No sweats, chills. Immunization:  No queried Travel:  none   PHYSICAL EXAM: Vital signs in last 24 hours: Temp:  [98.3 F (36.8 C)-98.6 F (37 C)] 98.5 F (36.9 C) (08/04 1315) Pulse Rate:  [58-80] 68 (08/04 1315) Resp:  [20-30] 20 (08/04 1315) BP: (148-223)/(55-70) 174/58 mmHg (08/04 1315) SpO2:  [96 %-100 %] 99 % (08/04 1315) Weight:  [106.2 kg (234 lb 2.1 oz)] 106.2 kg (234 lb 2.1 oz) (08/04 0318)  General: obese, pleasant,  comfortable AAF who is dyspneic with speech Head:  Some facial swelling vs obesity,   Eyes:  + periorbital edema Ears:  Not HOH  Nose:  No discharge Mouth:  Clear, moist, fair dentition Neck:  no JVD, no bruits Lungs:  + expiratory wheexing.  + dyspnea with speach Heart: RRR, 2/6 SEM. Abdomen:  Obese, soft.  No masses, no HSM.  No bruits.   Rectal: brown stool, no gross blood but small spec of FOB+ stool.  External hemorrhoid is not thrombosed and not ulcerated   Musc/Skeltl: no joint contracture Extremities:  + 2 to 3 plus brawny, tense, woody edema from knees down.  No foot ulcers  Neurologic:  Oriented x 3.  Mentation and speech is slow.  Oriented x 3 but story changes.   Skin:  LE per above Tattoos:  none Nodes:  No cervical adenopathy   Psych:  Pleasant, relaxed.   Intake/Output from previous day: 08/03 0701 - 08/04 0700 In: 240 [P.O.:240] Out: -  Intake/Output this shift: Total I/O In: 480 [P.O.:480] Out: -   LAB RESULTS:    Ref. Range 02/10/2011 04:55 04/08/2011 22:58 12/16/2011 15:41 08/12/2012 23:50 08/13/2012 05:00  WBC Latest Range: 4.0-10.5 K/uL 7.8 9.0 7.5 8.6 8.3  RBC Latest Range: 3.87-5.11 MIL/uL 2.99 (L) 3.29 (L) 3.33 (L) 3.04 (L) 2.75 (L)  Hemoglobin Latest Range: 12.0-15.0 g/dL 8.8 (L) 9.8 (L) 9.4 (L) 8.8 (L) 8.0 (L)  HCT Latest Range: 36.0-46.0 % 26.3 (L) 27.8 (L) 28.5 (L) 25.7 (L) 23.3 (L)  MCV Latest Range: 78.0-100.0 fL 88.0 84.5 85.6 84.5 84.7  MCH Latest Range: 26.0-34.0 pg 29.4 29.8 28.2 28.9 29.1  MCHC Latest Range: 30.0-36.0 g/dL  33.5 35.3 33.0 34.2 34.3  RDW Latest Range: 11.5-15.5 % 14.5 14.5 15.2 16.0 (H) 16.1 (H)  Platelets Latest Range: 150-400 K/uL 201 199 221 244 222  Neutrophils Relative % Latest Range: 43-77 %   71       BMET Lab Results  Component Value Date   NA 141 08/13/2012   NA 142 08/12/2012   NA 140 07/23/2012   K 4.0 08/13/2012   K 4.2 08/12/2012   K 4.5 07/23/2012   CL 109 08/13/2012   CL 110 08/12/2012   CL 112 07/23/2012   CO2 21  08/13/2012   CO2 21 08/12/2012   CO2 23 07/23/2012   GLUCOSE 91 08/13/2012   GLUCOSE 117* 08/12/2012   GLUCOSE 226* 07/23/2012   BUN 37* 08/13/2012   BUN 41* 08/12/2012   BUN 36* 07/23/2012   CREATININE 1.39* 08/13/2012   CREATININE 1.43* 08/12/2012   CREATININE 1.4* 07/23/2012   CALCIUM 8.0* 08/13/2012   CALCIUM 8.3* 08/12/2012   CALCIUM 8.7 07/23/2012   LFT  Recent Labs  08/12/12 2350  PROT 6.5  ALBUMIN 2.9*  AST 16  ALT 22  ALKPHOS 81  BILITOT 0.2*   PT/INR Lab Results  Component Value Date   INR 1.16 08/12/2012   Transthoracic Echocardiography 07/27/2012 Study Conclusions - Left ventricle: The cavity size was normal. Wall thickness was increased in a pattern of moderate LVH. The estimated ejection fraction was 65%. Wall motion was normal; there were no regional wall motion abnormalities. - Aortic valve: The valve appears to be grossly normal. Mild regurgitation. - Mitral valve: Mild regurgitation. - Left atrium: The atrium was moderately dilated. - Right ventricle: The cavity size was normal. Systolic function was normal. - Tricuspid valve: Mild regurgitation. - Pulmonary arteries: PA peak pressure: 97mm Hg (S).   RADIOLOGY STUDIES: No results found.  ENDOSCOPIC STUDIES: 07/2007 colonoscopy Diverticulosis and 68mm adenoma.   09/2003 Colonoscopy To cecum. Internal hemorrhoids, no recurrent polyps   11/1999 Colonoscopy Adenomatous polyp removed. Diverticulosis, internal hemorrhoids.   07/1999 EGD Esophagitis, GERD.   IMPRESSION: *  Rectal bleeding:  Rule out diverticular (most likely), hemorrhoidal, neoplasia/recurrent adenomatous polyp *  Acute on chronic anemia. Hgb not far from baselines of last few years.  *  Asthma, COPD *  IDDM *  Chronic kidney disease. GFR 48.  Does not seem severe enough to be causing anemia but does have several comorbidities that could be causing anemia of chronic disease.   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  Split dose  Movi Prep tonite and in AM.  *  CBC in AM. *  Has she had TSH check in last several months?  If not, would be worth obtaining, as this can contribute to Anemia.    LOS: 1 day   Azucena Freed  08/13/2012, 3:05 PM Pager: [phone]      Attending physician's note   I have taken a history, examined the patient and reviewed the chart. I agree with the Advanced Practitioner's note, impression and recommendations. Acute LGI bleeding with a chronic anemia. Extensive diverticulosis, hemorrhoids and an adenomatous colon polyp on colonoscopy in 2009. Colonoscopy tomorrow for further evaluation. The risk of the procedure and sedation is increased due to her pulmonary difficulties. Monitor Hb/Hct.  Ladene Artist, MD [facility]

## 2012-08-14 NOTE — Interval H&P Note (Signed)
History and Physical Interval Note:  08/14/2012 11:54 AM  [name]  has presented today for surgery, with the diagnosis of painless hematochezia, hemorrhoids and hx colon polyps  The various methods of treatment have been discussed with the patient and family. After consideration of risks, benefits and other options for treatment, the patient has consented to  Procedure(s): COLONOSCOPY (N/A) as a surgical intervention .  The patient's history has been reviewed, patient examined, no change in status, stable for surgery.  I have reviewed the patient's chart and labs.  Questions were answered to the patient's satisfaction.     Pricilla Riffle. Fuller Plan MD [facility]

## 2012-08-14 NOTE — Op Note (Signed)
[facility] [place], [address]   COLONOSCOPY PROCEDURE REPORT PATIENT: [name], [name]  MR#: [id number] BIRTHDATE: 07/10/1940 , 65  yrs. old GENDER: Female ENDOSCOPIST: Ladene Artist, MD, [facility] REFERRED [id number] Reynaldo Minium, M.D. PROCEDURE DATE:  08/14/2012 PROCEDURE:   Colonoscopy with snare polypectomy ASA CLASS:   Class III INDICATIONS:hematochezia and Patient's personal history of adenomatous colon polyps. MEDICATIONS: medications were titrated to patient response per physician's verbal order, Fentanyl 75 mcg IV, and Versed 5 mg IV DESCRIPTION OF PROCEDURE:   After the risks benefits and alternatives of the procedure were thoroughly explained, informed consent was obtained.  A digital rectal exam revealed no abnormalities of the rectum.   The [id number]  endoscope was introduced through the anus and advanced to the cecum, which was identified by both the appendix and ileocecal valve. No adverse events experienced.  The colon was tortuous. The quality of the prep was adequate, using MoviPrep  The instrument was then slowly withdrawn as the colon was fully examined.  COLON FINDINGS: A sessile polyp measuring 6 mm in size was found at the cecum.  A polypectomy was performed using snare without cautery. The resection was complete and the polyp tissue was completely retrieved. There was mild oozing a the polypectomy site and hot forceps with cautery was applied to the site with hemostasis achieved. A diverticulum was noted at the cecum adjacent to the polyp.  Moderate diverticulosis was noted in the sigmoid colon and descending colon.   The colon was otherwise normal. There was no diverticulosis, inflammation, polyps or cancers unless previously stated.  Retroflexed views revealed small internal hemorrhoids. The time to cecum=4 minutes 00 seconds.  Withdrawal time=15 minutes 00 seconds.  The scope was withdrawn and the procedure  completed. COMPLICATIONS: There were no complications. ENDOSCOPIC IMPRESSION: 1.   Sessile polyp measuring 6 mm at the cecum; polypectomy performed using snare and cautery 2.   Diverticulum at the cecum adjacent to the polyp 3.   Moderate diverticulosis was noted in the sigmoid colon and descending colon 4.   Small internal hemorrhoids  RECOMMENDATIONS: 1.  Hold aspirin, aspirin products, and anti-inflammatory medication for 2 weeks. 2.  Presumed diverticular bleed 3.  Await pathology results 4.  High fiber diet with liberal fluid intake. 5.  Repeat Colonoscopy in 5 years with Dr. Silvano Rusk.  eSigned:  Ladene Artist, MD, [facility] 08/14/2012 12:38 PM

## 2012-08-15 ENCOUNTER — Encounter (HOSPITAL_COMMUNITY): Payer: Self-pay | Admitting: Gastroenterology

## 2012-08-15 LAB — GLUCOSE, CAPILLARY
Glucose-Capillary: 66 mg/dL — ABNORMAL LOW (ref 70–99)
Glucose-Capillary: 84 mg/dL (ref 70–99)
Glucose-Capillary: 222 mg/dL — ABNORMAL HIGH (ref 70–99)

## 2012-08-15 LAB — CBC
Hemoglobin: 8 g/dL — ABNORMAL LOW (ref 12.0–15.0)
MCH: 28.7 pg (ref 26.0–34.0)
MCHC: 32.9 g/dL (ref 30.0–36.0)
RDW: 16.5 % — ABNORMAL HIGH (ref 11.5–15.5)

## 2012-08-15 MED ORDER — ALBUTEROL SULFATE HFA 108 (90 BASE) MCG/ACT IN AERS
2.0000 | INHALATION_SPRAY | RESPIRATORY_TRACT | Status: DC | PRN
  Administered 2012-08-15: 2 via RESPIRATORY_TRACT
  Filled 2012-08-15: qty 6.7

## 2012-08-15 NOTE — Progress Notes (Signed)
Patient discharge Home per Md order.  Discharge instructions reviewed with patient and family.  Copies of all forms given and explained. Patient/family voiced understanding of all instructions.  Discharge in no acute distress.

## 2012-08-16 ENCOUNTER — Telehealth: Payer: Self-pay | Admitting: Adult Health

## 2012-08-16 ENCOUNTER — Other Ambulatory Visit (HOSPITAL_COMMUNITY): Payer: Self-pay | Admitting: Interventional Radiology

## 2012-08-16 DIAGNOSIS — C641 Malignant neoplasm of right kidney, except renal pelvis: Secondary | ICD-10-CM

## 2012-08-16 NOTE — Telephone Encounter (Signed)
According to EPIC this was sent to [facility] 08/07/12 and confirmed by Melissa this was received I called Melissa and she will check on this. Will await call back

## 2012-08-16 NOTE — Telephone Encounter (Signed)
ONO to completed tonight--Melissa states that this was an internal error and she sees now where the order was received on 7/29 or the O2 to be checked.  Apologies to TP and pt for the delay.  Patient has been contacted and aware that ONO to be completed tonight. Someone to contact her shortly.   Will send to TP as FYI.

## 2012-08-17 ENCOUNTER — Telehealth: Payer: Self-pay | Admitting: Pulmonary Disease

## 2012-08-17 NOTE — Telephone Encounter (Signed)
Spoke with Melissa/[facility]-- Aware to wait per VS  Melissa will wait for our order.

## 2012-08-17 NOTE — Telephone Encounter (Signed)
Can wait

## 2012-08-17 NOTE — Telephone Encounter (Signed)
Does ONO be done before 8/24 or ok to wait until it is covered by insurance? Please advise, thank you!!

## 2012-08-17 NOTE — Telephone Encounter (Signed)
Spoke with Melissa/[facility]-- As seen in earlier messages, pt did not qualify for O2qhs d/t pt not de-stating the required amt. Pt can have repeat ONO is VS agrees for this to be ordered-to retest her and recheck her sats. Pt can repeat ONO after 09/02/12 per Medicare (will only cover after 30 days of prior study) Pt also has the option to self pay/private pay if you wish to have this done sooner than 09/02/12 @ $25.00  Please advise Dr Halford Chessman, thanks.  Page sent to Dr Halford Chessman Please see message in box&gt; Radel, Tanna(April 10, 1940) MRN: [id number]

## 2012-08-17 NOTE — Telephone Encounter (Signed)
Okay to send order to repeat ONO with room air.

## 2012-09-03 ENCOUNTER — Encounter: Payer: Self-pay | Admitting: Adult Health

## 2012-09-05 ENCOUNTER — Ambulatory Visit (HOSPITAL_BASED_OUTPATIENT_CLINIC_OR_DEPARTMENT_OTHER): Payer: Medicare Other | Attending: Adult Health | Admitting: Radiology

## 2012-09-05 VITALS — Ht 65.0 in | Wt 241.0 lb

## 2012-09-05 DIAGNOSIS — J4489 Other specified chronic obstructive pulmonary disease: Secondary | ICD-10-CM | POA: Insufficient documentation

## 2012-09-05 DIAGNOSIS — G4733 Obstructive sleep apnea (adult) (pediatric): Secondary | ICD-10-CM | POA: Insufficient documentation

## 2012-09-05 DIAGNOSIS — E119 Type 2 diabetes mellitus without complications: Secondary | ICD-10-CM | POA: Insufficient documentation

## 2012-09-05 DIAGNOSIS — I10 Essential (primary) hypertension: Secondary | ICD-10-CM | POA: Insufficient documentation

## 2012-09-05 DIAGNOSIS — J449 Chronic obstructive pulmonary disease, unspecified: Secondary | ICD-10-CM

## 2012-09-05 DIAGNOSIS — I2789 Other specified pulmonary heart diseases: Secondary | ICD-10-CM | POA: Insufficient documentation

## 2012-09-13 DIAGNOSIS — G4733 Obstructive sleep apnea (adult) (pediatric): Secondary | ICD-10-CM

## 2012-09-14 NOTE — Procedures (Signed)
NAME:  [name], [name] NO.:  [id number]  MEDICAL RECORD NO.:  [id number]          PATIENT TYPE:  OUT  LOCATION:  SLEEP CENTER                 FACILITY[facility]  [facility]  PHYSICIAN:  Chesley Mires, MD        DATE OF BIRTH:  10-05-1940  DATE OF STUDY:  09/05/2012                           NOCTURNAL POLYSOMNOGRAM  REFERRING PHYSICIAN:  Rexene Edison, NP  FACILITY:  [facility] STUDY:  [name] is a 72 year old female who has a history of COPD, diabetes, hypertension, and depression.  She was found to have pulmonary hypertension.  She reports snoring, sleep disruption, and daytime sleepiness.  She was referred to the sleep lab for further evaluation of hypersomnia with obstructive sleep apnea.  Height is 5 feet 5 inches.  Weight is 241 pounds.  BMI is 40. Neck size is 16 inches.  EPWORTH SLEEPINESS SCORE:  13.  MEDICATIONS:  Reviewed in her chart.  SLEEP ARCHITECTURE:  Total recording time was 356 minutes.  Total sleep time was 107 minutes.  Sleep efficiency was 30%.  Sleep latency was 7 minutes.  The study was notable for lack of slow-wave sleep and REM sleep.  She slept exclusively in the supine position.  She had difficulty with sleep initiation, sleep maintenance due to respiratory events.  RESPIRATORY DATA:  The average respiratory rate was 20.  Moderate snoring was noted by the technician.  The overall apnea/hypopnea index was 51.9. There was 1 mixed respiratory event.  The remainder of the events were obstructive in nature.  OXYGEN DATA:  The baseline oxygenation was 95%.  The oxygen saturation nadir was 81%.  The patient spent a total of 3.6 minutes with an oxygen saturation below 88%.  The study was conducted without the use of supplemental oxygen.  CARDIAC DATA:  The average heart rate was 67.  The rhythm strip showed sinus rhythm with occasional PACs.  MOVEMENT-PARASOMNIA:  The periodic limb movement index was 0.  The patient had  no restroom trips.  IMPRESSIONS-RECOMMENDATIONS:  This study shows evidence for severe obstructive sleep apnea with an apnea/hypopnea index of 51.9 and oxygen saturation nadir of 81%.  Additional therapeutic interventions include weight reduction, CPAP therapy, oral appliance, or surgical intervention.     Chesley Mires, MD [place], [place] Board of Sleep Medicine    VS/MEDQ  D:  09/13/2012 11:10:08  T:  09/13/2012 22:53:13  Job:  [id number]

## 2012-09-17 ENCOUNTER — Encounter: Payer: Self-pay | Admitting: Pulmonary Disease

## 2012-09-17 ENCOUNTER — Encounter: Payer: Self-pay | Admitting: Adult Health

## 2012-09-17 ENCOUNTER — Telehealth: Payer: Self-pay | Admitting: Pulmonary Disease

## 2012-09-17 DIAGNOSIS — G4733 Obstructive sleep apnea (adult) (pediatric): Secondary | ICD-10-CM

## 2012-09-17 HISTORY — DX: Obstructive sleep apnea (adult) (pediatric): G47.33

## 2012-09-17 NOTE — Telephone Encounter (Addendum)
PSG 09/05/12 >> AHI 51.9, SpO2 low 81%.  Will have my nurse inform pt that sleep study shows severe sleep apnea.  Options are to proceed with CPAP set up now, and then discuss results in more detail at [facility] on 10/04/12.  Alternative is to wait for ROV on 10/04/12 to discuss results first.  Preference is to start CPAP now.  If pt is agreeable to CPAP, then send order for auto CPAP range 5 to 15 cm H2O with heated humidity and mask of choice.  Have download sent 2 weeks after starting CPAP.

## 2012-09-17 NOTE — Telephone Encounter (Signed)
Pt is aware of her sleep study results. Order will be placed for CPAP.

## 2012-10-04 ENCOUNTER — Ambulatory Visit (INDEPENDENT_AMBULATORY_CARE_PROVIDER_SITE_OTHER): Payer: Medicare Other | Admitting: Pulmonary Disease

## 2012-10-04 ENCOUNTER — Encounter: Payer: Self-pay | Admitting: Pulmonary Disease

## 2012-10-04 VITALS — BP 140/88 | HR 89 | Temp 98.1°F | Wt 240.0 lb

## 2012-10-04 DIAGNOSIS — J309 Allergic rhinitis, unspecified: Secondary | ICD-10-CM

## 2012-10-04 DIAGNOSIS — J449 Chronic obstructive pulmonary disease, unspecified: Secondary | ICD-10-CM

## 2012-10-04 DIAGNOSIS — G4733 Obstructive sleep apnea (adult) (pediatric): Secondary | ICD-10-CM

## 2012-10-04 NOTE — Progress Notes (Signed)
Chief Complaint  Patient presents with  . Asthma    Breathing is improved. Reports SOB, coughing and wheezing. Denies chest tightness.    History of Present Illness: [name] is a 72 y.o. female former smoker with chronic cough from asthma, and allergic rhinitis with post-nasal drip.  She is here to review her sleep study.  This showed severe sleep apnea.  She has since been started on CPAP.  She has full face mask.  Feels this has helped her sleep.  She had a few episodes of nausea, and couldn't use her CPAP on those nights.  Her nausea has improved.  Her breathing is improved.  She uses her nebulizer twice per day.  She feels flonase has helped her sinuses.  Tests: RAST 04/20/11>>IgE 63.2, dust mites, cat/dog dander, elm, plantain CXR 09/30/11>>Moderate cardiac silhouette enlargement. Mild vascular congestion pattern. No consolidation or pleural effusion evident.  10/18/11 ENT evaluation negative Ruby Cola) PFT 11/29/11>>FEV1 0.97 (48%), FEV1% 67, TLC 2.82 (59%), DLCO 46%, +BD from FEF 25-75%. Echo 07/27/12 >> mod LVH, EF 65%, mild AS, mild MR, mod LA dilation, PAS 61 mmHg PSG 09/05/12 >> AHI 51.9, SpO2 low 81%.  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  She  has past surgical history that includes Total knee arthroplasty; Incise and drain abcess; Umbilical hernia repair (2007); Lipoma resection; Colonoscopy (2209 or 2010); Upper gastrointestinal endoscopy (2001); and Colonoscopy (N/A, 08/14/2012).   Current Outpatient Prescriptions on File Prior to Visit  Medication Sig Dispense Refill  . albuterol  (PROAIR HFA) 108 (90 BASE) MCG/ACT inhaler Inhale 2 puffs into the lungs every 6 (six) hours as needed for wheezing.      Marland Kitchen albuterol (PROVENTIL) (2.5 MG/3ML) 0.083% nebulizer solution Take 3 mLs (2.5 mg total) by nebulization 4 (four) times daily.  75 mL  12  . amLODipine (NORVASC) 5 MG tablet Take 10 mg by mouth daily.       . Ascorbic Acid (VITAMIN C) 1000 MG tablet Take 1,000 mg by mouth daily.      Marland Kitchen augmented betamethasone dipropionate (DIPROLENE-AF) 0.05 % cream Apply 1 application topically 2 (two) times daily. To both legs      . budesonide (PULMICORT) 0.25 MG/2ML nebulizer solution Take 2 mLs (0.25 mg total) by nebulization 2 (two) times daily.  60 mL  12  . Calcium Carbonate-Vitamin D (CALTRATE 600+D) 600-400 MG-UNIT per tablet Take 1 tablet by mouth daily.      Marland Kitchen esomeprazole (NEXIUM) 40 MG capsule Take 40 mg by mouth daily before breakfast.       . ferrous sulfate 325 (65 FE) MG tablet Take 325 mg by mouth daily with breakfast.      . fluticasone (FLONASE) 50 MCG/ACT nasal spray Place 2 sprays into the nose daily.       . folic acid (FOLVITE) A999333 MCG tablet Take 400 mcg by mouth daily.      . furosemide (LASIX) 40 MG tablet Take 80 mg by mouth daily.       Marland Kitchen glipiZIDE (GLUCOTROL XL) 10 MG 24 hr tablet Take 10 mg by mouth daily.       Marland Kitchen HUMALOG MIX 75/25 KWIKPEN (75-25) 100 UNIT/ML SUSP Inject 15 Units into the skin  every morning.      . insulin glargine (LANTUS) 100 UNIT/ML injection Inject 35 Units into the skin daily.      . Magnesium 250 MG TABS Take 1 tablet by mouth daily.      . montelukast (SINGULAIR) 10 MG tablet Take 10 mg by mouth at bedtime.      . Omega-3 Fatty Acids (FISH OIL) 1000 MG CAPS Take 1 capsule by mouth daily.      . polyethylene glycol (MIRALAX / GLYCOLAX) packet Take 17 g by mouth daily.      . potassium chloride SA (K-DUR,KLOR-CON) 20 MEQ tablet Take 20 mEq by mouth daily.      . prochlorperazine (COMPAZINE) 5 MG tablet Take 5 mg by mouth every 6 (six) hours  as needed for nausea.      . Propylene Glycol (SYSTANE BALANCE OP) Apply 1 drop to eye 2 (two) times daily.      Marland Kitchen pyridOXINE (VITAMIN B-6) 100 MG tablet Take 100 mg by mouth daily.      . simvastatin (ZOCOR) 20 MG tablet Take 20 mg by mouth every evening.      . vitamin B-12 (CYANOCOBALAMIN) 1000 MCG tablet Take 1,000 mcg by mouth daily.      . vitamin E 400 UNIT capsule Take 400 Units by mouth daily.       No current facility-administered medications on file prior to visit.    No Known Allergies   Physical Exam:  General - No distress, obese  HEENT - Left eye pstosis, wears glasses, no sinus tenderness, clear nasal discharge, no oral exudate, no LAN  Cardiac - s1s2 regular, 2/6 systolic murmur  Chest - prolonged exhalation, no wheeze/rales Abdomen - Soft, nontender Extremities - 1+ leg edema, valgus deformities, Rt foot in brace  Neurologic - normal strength, difficulty with recall memory  Skin - chronic venous stasis changes  Psychiatric - pleasant, normal mood/behavior    Assessment/Plan:  Chesley Mires, MD [place] 10/04/2012, 2:46 PM Pager:  [phone] After 3pm call: [phone]

## 2012-10-04 NOTE — Assessment & Plan Note (Signed)
Stable on flonase.

## 2012-10-04 NOTE — Assessment & Plan Note (Signed)
She has severe sleep apnea.  I have reviewed the recent sleep study results with the patient.  We discussed how sleep apnea can affect various health problems including risks for hypertension, cardiovascular disease, and diabetes.  We also discussed how sleep disruption can increase risks for accident, such as while driving.  Weight loss as a means of improving sleep apnea was also reviewed.  Additional treatment options discussed were CPAP therapy, oral appliance, and surgical intervention.  She has been started on CPAP and doing well with this.  Will arrange for ONO with CPAP and RA to determine if she also needs supplemental oxygen.

## 2012-10-04 NOTE — Patient Instructions (Signed)
Will arrange for overnight oxygen test while wearing CPAP Flu shot today Follow up in 2 months

## 2012-10-04 NOTE — Assessment & Plan Note (Signed)
Stable on current regimen of pulmicort, montelukast, and prn albuterol.

## 2012-10-11 ENCOUNTER — Other Ambulatory Visit: Payer: Self-pay | Admitting: Radiology

## 2012-10-11 DIAGNOSIS — C641 Malignant neoplasm of right kidney, except renal pelvis: Secondary | ICD-10-CM

## 2012-10-16 LAB — CREATININE WITH EST GFR: Creat: 1.55 mg/dL — ABNORMAL HIGH (ref 0.50–1.10)

## 2012-10-17 ENCOUNTER — Ambulatory Visit (HOSPITAL_COMMUNITY)
Admission: RE | Admit: 2012-10-17 | Discharge: 2012-10-17 | Disposition: A | Discharge status: Home / Self Care | Payer: Medicare Other | Source: Ambulatory Visit | Attending: Interventional Radiology | Admitting: Interventional Radiology

## 2012-10-17 ENCOUNTER — Ambulatory Visit
Admission: RE | Admit: 2012-10-17 | Discharge: 2012-10-17 | Disposition: A | Discharge status: Home / Self Care | Payer: Medicare Other | Source: Ambulatory Visit | Attending: Interventional Radiology | Admitting: Interventional Radiology

## 2012-10-17 ENCOUNTER — Other Ambulatory Visit: Payer: Medicare Other

## 2012-10-17 DIAGNOSIS — C641 Malignant neoplasm of right kidney, except renal pelvis: Secondary | ICD-10-CM

## 2012-10-17 DIAGNOSIS — Q762 Congenital spondylolisthesis: Secondary | ICD-10-CM | POA: Insufficient documentation

## 2012-10-17 DIAGNOSIS — C649 Malignant neoplasm of unspecified kidney, except renal pelvis: Secondary | ICD-10-CM | POA: Insufficient documentation

## 2012-10-17 DIAGNOSIS — N289 Disorder of kidney and ureter, unspecified: Secondary | ICD-10-CM | POA: Insufficient documentation

## 2012-10-17 DIAGNOSIS — K8020 Calculus of gallbladder without cholecystitis without obstruction: Secondary | ICD-10-CM | POA: Insufficient documentation

## 2012-10-17 MED ORDER — IOHEXOL 300 MG/ML  SOLN
100.0000 mL | Freq: Once | INTRAMUSCULAR | Status: AC | PRN
  Administered 2012-10-17: 100 mL via INTRAVENOUS

## 2012-10-17 NOTE — Progress Notes (Signed)
Currently taking an antibiotic for UTI prescribed by physician extender at [facility].  Denies problems with urination.  Appetite:  Good. Sleep: improving.  Feels that is related to CPAP over last 2-3 wks for recent Dx of sleep apnea.  Also complaining of back pain with radiation to RLE.   Interventional Radiology Procedure Request  Reece Levy, RN 10/17/2012 3:55 PM

## 2012-10-23 ENCOUNTER — Telehealth: Payer: Self-pay | Admitting: Pulmonary Disease

## 2012-10-23 DIAGNOSIS — G4733 Obstructive sleep apnea (adult) (pediatric): Secondary | ICD-10-CM

## 2012-10-23 NOTE — Telephone Encounter (Signed)
Pt is aware of results. 

## 2012-10-23 NOTE — Telephone Encounter (Signed)
ONO with CPAP and RA 10/09/12 >> Test time 3 hrs 33 min.  Mean SpO2 92%, low SpO2 82%.  Spent 33 min with SpO2 < 89%.  Will have my nurse inform pt that ONO shows low oxygen level, but this may be related to needing adjustment to CPAP setting.  Will get CPAP download and call her with result.  Will then decide if she needs increase in CPAP setting or if she may need supplemental oxygen at night with CPAP.

## 2012-11-06 ENCOUNTER — Telehealth: Payer: Self-pay | Admitting: Pulmonary Disease

## 2012-11-06 DIAGNOSIS — G4733 Obstructive sleep apnea (adult) (pediatric): Secondary | ICD-10-CM

## 2012-11-06 NOTE — Telephone Encounter (Signed)
Pt is aware of results. Order has been placed for repeat ONO and O2 qhs.

## 2012-11-06 NOTE — Telephone Encounter (Signed)
CPAP 09/24/12 to 10/23/12 >> used on 20 of 30 nights with average 2.8 hours.  Average AHI 2.2 with median CPAP 7 cm H2O and 95 th percentile CPAP 10 cm H2O.  ONO with CPAP and RA 10/09/12 >> Test time 3 hrs 33 min. Mean SpO2 92%, low SpO2 82%. Spent 33 min with SpO2 < 89%.  Will have my nurse inform pt that CPAP report shows very good control of sleep apnea.  She should continue using this for the entire time she is asleep.  Her ONO did show low oxygen level not related to sleep apnea.  This is likely related to hypoventilation (shallow breathing).  She will need to use oxygen with CPAP at night.  Will arrange for 2 liters oxygen with CPAP and repeat ONO on this set up.  Will have my nurse place order for this if pt is agreeable to plan.

## 2012-11-06 NOTE — Telephone Encounter (Signed)
lmtcb x1 

## 2012-11-06 NOTE — Telephone Encounter (Signed)
Returning call can be reached at [phone].Elnita Maxwell

## 2012-11-13 ENCOUNTER — Telehealth: Payer: Self-pay | Admitting: Pulmonary Disease

## 2012-11-13 DIAGNOSIS — G4733 Obstructive sleep apnea (adult) (pediatric): Secondary | ICD-10-CM

## 2012-11-13 DIAGNOSIS — G4736 Sleep related hypoventilation in conditions classified elsewhere: Secondary | ICD-10-CM

## 2012-11-13 NOTE — Telephone Encounter (Signed)
Received ONO with pt using 2 liters.  Test was to be done with pt using 2 liters oxygen and CPAP >> ordered mistakenly entered for ONO with 2 liters but no CPAP.  Will re-order ONO with pt to use CPAP and 2 liters oxygen.

## 2012-11-30 ENCOUNTER — Encounter: Payer: Self-pay | Admitting: Pulmonary Disease

## 2012-12-04 ENCOUNTER — Ambulatory Visit (INDEPENDENT_AMBULATORY_CARE_PROVIDER_SITE_OTHER): Payer: Medicare Other | Admitting: Pulmonary Disease

## 2012-12-04 ENCOUNTER — Encounter: Payer: Self-pay | Admitting: Pulmonary Disease

## 2012-12-04 ENCOUNTER — Telehealth: Payer: Self-pay | Admitting: Pulmonary Disease

## 2012-12-04 VITALS — BP 130/84 | HR 72 | Ht 64.0 in | Wt 235.0 lb

## 2012-12-04 DIAGNOSIS — G4733 Obstructive sleep apnea (adult) (pediatric): Secondary | ICD-10-CM

## 2012-12-04 DIAGNOSIS — G4700 Insomnia, unspecified: Secondary | ICD-10-CM

## 2012-12-04 DIAGNOSIS — J449 Chronic obstructive pulmonary disease, unspecified: Secondary | ICD-10-CM

## 2012-12-04 DIAGNOSIS — E662 Morbid (severe) obesity with alveolar hypoventilation: Secondary | ICD-10-CM

## 2012-12-04 DIAGNOSIS — J309 Allergic rhinitis, unspecified: Secondary | ICD-10-CM

## 2012-12-04 MED ORDER — TRAZODONE HCL 50 MG PO TABS: 50.0000 mg | ORAL_TABLET | Freq: Every evening | ORAL | Status: DC | PRN

## 2012-12-04 MED ORDER — MONTELUKAST SODIUM 10 MG PO TABS: 10.0000 mg | ORAL_TABLET | Freq: Every day | ORAL | Status: DC

## 2012-12-04 NOTE — Telephone Encounter (Signed)
ONO with CPAP and 2 liters 11/20/12 >> Test time 3 hrs 4 min.  Mean SpO2 94.3%, low SpO2 77% (?artifact).  Spent 10 min 32 sec with SpO2 < 89%.  Will have my nurse inform pt that ONO with CPAP and 2 liters looks better, but oxygen still low at times during the night.  Will increase oxygen to 2.5 liters with CPAP (I have sent order for this).

## 2012-12-04 NOTE — Patient Instructions (Signed)
Try trazodone 50 mg nightly as needed to help with sleep Follow up in 3 months

## 2012-12-04 NOTE — Progress Notes (Signed)
Chief Complaint  Patient presents with  . Sleep Apnea    Using CPAP when she does get to sleep. Feels like she doesn't get enough when she does use it. Has a hard time sleeping.    History of Present Illness: [name] is a 72 y.o. female former smoker with chronic cough from asthma, allergic rhinitis with post-nasal drip, and severe OSA on auto CPAP and 2.5 liters qhs.  She has been using CPAP for about 3 to 4 hours per night >> this is all the sleep she gets.  She feels CPAP helps her sleep.  She has trouble falling asleep, and staying asleep.  She feels anxious.  Her breathing has been okay.  She is not having much cough, wheeze, or sputum.  She denies fever.  Her sinuses have been okay.  She was changed from brand name to generic singulair >> generic version makes her nauseous.  She did not have problems with nausea from brand name singulair.  She feels singulair helps her breathing.  Tests: RAST 04/20/11>>IgE 63.2, dust mites, cat/dog dander, elm, plantain CXR 09/30/11>>Moderate cardiac silhouette enlargement. Mild vascular congestion pattern. No consolidation or pleural effusion evident.  10/18/11 ENT evaluation negative Ruby Cola) PFT 11/29/11>>FEV1 0.97 (48%), FEV1% 67, TLC 2.82 (59%), DLCO 46%, +BD from FEF 25-75%. Echo 07/27/12 >> mod LVH, EF 65%, mild AS, mild MR, mod LA dilation, PAS 61 mmHg PSG 09/05/12 >> AHI 51.9, SpO2 low 81%. CPAP 09/24/12 to 10/23/12 >> used on 20 of 30 nights with average 2.8 hours. Average AHI 2.2 with median CPAP 7 cm H2O and 95 th percentile CPAP 10 cm H2O. ONO with CPAP and RA 10/09/12 >> Test time 3 hrs 33 min. Mean SpO2 92%, low SpO2 82%. Spent 33 min with SpO2 < 89%. ONO with CPAP and 2 liters 11/20/12 >> Test time 3 hrs 4 min. Mean SpO2 94.3%, low SpO2 77% (?artifact). Spent 10 min 32 sec with SpO2 < 89%.    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  She  has past surgical history that includes Total knee arthroplasty; Incise and drain abcess; Umbilical hernia repair (2007); Lipoma resection; Colonoscopy (2209 or 2010); Upper gastrointestinal endoscopy (2001); and Colonoscopy (N/A, 08/14/2012).   Current Outpatient Prescriptions on File Prior to Visit  Medication Sig Dispense Refill  . albuterol (PROAIR HFA) 108 (90 BASE) MCG/ACT inhaler Inhale 2 puffs into the lungs every 6 (six) hours as needed for wheezing.      Marland Kitchen albuterol (PROVENTIL) (2.5 MG/3ML) 0.083% nebulizer solution Take 3 mLs (2.5 mg total) by nebulization 4 (four) times daily.  75 mL  12  . amLODipine (NORVASC) 5 MG tablet Take 10 mg by mouth daily.       . Ascorbic Acid (VITAMIN C) 1000 MG tablet Take 1,000 mg by mouth daily.      Marland Kitchen augmented betamethasone dipropionate (DIPROLENE-AF) 0.05 % cream Apply 1 application topically 2 (two) times daily. To both legs      . budesonide (PULMICORT) 0.25 MG/2ML nebulizer solution Take 2 mLs (0.25 mg total) by nebulization 2 (two) times daily.  60 mL  12  . Calcium Carbonate-Vitamin D (CALTRATE 600+D) 600-400 MG-UNIT per tablet Take 1 tablet by mouth daily.      Marland Kitchen  esomeprazole (NEXIUM) 40 MG capsule Take 40 mg by mouth daily before breakfast.       . ferrous sulfate 325 (65 FE) MG tablet Take 325 mg by mouth daily with breakfast.      . fluticasone (FLONASE) 50 MCG/ACT nasal spray Place 2 sprays into the nose daily.       . folic acid (FOLVITE) A999333 MCG tablet Take 400 mcg by mouth daily.      . furosemide (LASIX) 40 MG tablet Take 80 mg by mouth daily.       Marland Kitchen glipiZIDE (GLUCOTROL XL) 10 MG 24 hr tablet Take 10 mg by mouth daily.       Marland Kitchen HUMALOG MIX 75/25  KWIKPEN (75-25) 100 UNIT/ML SUSP Inject 15 Units into the skin every morning.      . insulin glargine (LANTUS) 100 UNIT/ML injection Inject 35 Units into the skin daily.      . Magnesium 250 MG TABS Take 1 tablet by mouth daily.      . montelukast (SINGULAIR) 10 MG tablet Take 10 mg by mouth at bedtime.      . Omega-3 Fatty Acids (FISH OIL) 1000 MG CAPS Take 1 capsule by mouth daily.      . polyethylene glycol (MIRALAX / GLYCOLAX) packet Take 17 g by mouth daily.      . potassium chloride SA (K-DUR,KLOR-CON) 20 MEQ tablet Take 20 mEq by mouth daily.      . prochlorperazine (COMPAZINE) 5 MG tablet Take 5 mg by mouth every 6 (six) hours as needed for nausea.      . Propylene Glycol (SYSTANE BALANCE OP) Apply 1 drop to eye 2 (two) times daily.      Marland Kitchen pyridOXINE (VITAMIN B-6) 100 MG tablet Take 100 mg by mouth daily.      . simvastatin (ZOCOR) 20 MG tablet Take 20 mg by mouth every evening.      . vitamin B-12 (CYANOCOBALAMIN) 1000 MCG tablet Take 1,000 mcg by mouth daily.      . vitamin E 400 UNIT capsule Take 400 Units by mouth daily.       No current facility-administered medications on file prior to visit.    No Known Allergies   Physical Exam:  General - No distress, obese  HEENT - Left eye pstosis, wears glasses, no sinus tenderness, clear nasal discharge, no oral exudate, no LAN  Cardiac - s1s2 regular, 2/6 systolic murmur  Chest - prolonged exhalation, no wheeze/rales Abdomen - Soft, nontender Extremities - 1+ leg edema, valgus deformities Neurologic - normal strength, difficulty with recall memory  Skin - chronic venous stasis changes  Psychiatric - pleasant, normal mood/behavior    Assessment/Plan:  Chesley Mires, MD [place] 12/04/2012, 12:20 PM Pager:  [phone] After 3pm call: [phone]

## 2012-12-04 NOTE — Telephone Encounter (Signed)
Pt came in for appointment today. She is aware of ONO results.

## 2012-12-10 DIAGNOSIS — G4700 Insomnia, unspecified: Secondary | ICD-10-CM | POA: Insufficient documentation

## 2012-12-10 NOTE — Assessment & Plan Note (Signed)
She c/o trouble falling asleep, staying asleep, and feeling anxious.  Will try her on trazodone qhs.

## 2012-12-10 NOTE — Assessment & Plan Note (Signed)
She is to continue pulmicort, and prn albuterol.  She is intolerant of generic montelukast >> will change to brand name singulair.

## 2012-12-10 NOTE — Assessment & Plan Note (Signed)
She reports benefit from CPAP.  Encouraged her to use this whenever she is asleep, including during daytime naps.  She is to continue auto CPAP with 2.5 liters oxygen at night.

## 2012-12-10 NOTE — Assessment & Plan Note (Signed)
Continue flonase 

## 2013-02-03 ENCOUNTER — Inpatient Hospital Stay (HOSPITAL_COMMUNITY)
Admission: EM | Admit: 2013-02-03 | Discharge: 2013-02-07 | DRG: 378 | Disposition: A | Discharge status: Home / Self Care | Payer: Medicare Other | Attending: Internal Medicine | Admitting: Internal Medicine

## 2013-02-03 ENCOUNTER — Encounter (HOSPITAL_COMMUNITY): Payer: Self-pay | Admitting: Emergency Medicine

## 2013-02-03 DIAGNOSIS — N183 Chronic kidney disease, stage 3 unspecified: Secondary | ICD-10-CM | POA: Diagnosis present

## 2013-02-03 DIAGNOSIS — F329 Major depressive disorder, single episode, unspecified: Secondary | ICD-10-CM | POA: Diagnosis present

## 2013-02-03 DIAGNOSIS — G4733 Obstructive sleep apnea (adult) (pediatric): Secondary | ICD-10-CM | POA: Diagnosis present

## 2013-02-03 DIAGNOSIS — Z8711 Personal history of peptic ulcer disease: Secondary | ICD-10-CM

## 2013-02-03 DIAGNOSIS — Z87891 Personal history of nicotine dependence: Secondary | ICD-10-CM

## 2013-02-03 DIAGNOSIS — D649 Anemia, unspecified: Secondary | ICD-10-CM

## 2013-02-03 DIAGNOSIS — E1129 Type 2 diabetes mellitus with other diabetic kidney complication: Secondary | ICD-10-CM | POA: Diagnosis present

## 2013-02-03 DIAGNOSIS — E785 Hyperlipidemia, unspecified: Secondary | ICD-10-CM | POA: Diagnosis present

## 2013-02-03 DIAGNOSIS — E1149 Type 2 diabetes mellitus with other diabetic neurological complication: Secondary | ICD-10-CM | POA: Diagnosis present

## 2013-02-03 DIAGNOSIS — E1142 Type 2 diabetes mellitus with diabetic polyneuropathy: Secondary | ICD-10-CM | POA: Diagnosis present

## 2013-02-03 DIAGNOSIS — Z794 Long term (current) use of insulin: Secondary | ICD-10-CM

## 2013-02-03 DIAGNOSIS — D638 Anemia in other chronic diseases classified elsewhere: Secondary | ICD-10-CM | POA: Diagnosis present

## 2013-02-03 DIAGNOSIS — K5731 Diverticulosis of large intestine without perforation or abscess with bleeding: Principal | ICD-10-CM | POA: Diagnosis present

## 2013-02-03 DIAGNOSIS — Z79899 Other long term (current) drug therapy: Secondary | ICD-10-CM

## 2013-02-03 DIAGNOSIS — K922 Gastrointestinal hemorrhage, unspecified: Secondary | ICD-10-CM | POA: Diagnosis present

## 2013-02-03 DIAGNOSIS — Z803 Family history of malignant neoplasm of breast: Secondary | ICD-10-CM

## 2013-02-03 DIAGNOSIS — J449 Chronic obstructive pulmonary disease, unspecified: Secondary | ICD-10-CM | POA: Diagnosis present

## 2013-02-03 DIAGNOSIS — D62 Acute posthemorrhagic anemia: Secondary | ICD-10-CM | POA: Diagnosis present

## 2013-02-03 DIAGNOSIS — I129 Hypertensive chronic kidney disease with stage 1 through stage 4 chronic kidney disease, or unspecified chronic kidney disease: Secondary | ICD-10-CM | POA: Diagnosis present

## 2013-02-03 DIAGNOSIS — K219 Gastro-esophageal reflux disease without esophagitis: Secondary | ICD-10-CM | POA: Diagnosis present

## 2013-02-03 DIAGNOSIS — J4489 Other specified chronic obstructive pulmonary disease: Secondary | ICD-10-CM | POA: Diagnosis present

## 2013-02-03 DIAGNOSIS — Z96659 Presence of unspecified artificial knee joint: Secondary | ICD-10-CM

## 2013-02-03 DIAGNOSIS — F3289 Other specified depressive episodes: Secondary | ICD-10-CM | POA: Diagnosis present

## 2013-02-03 DIAGNOSIS — E7800 Pure hypercholesterolemia, unspecified: Secondary | ICD-10-CM | POA: Diagnosis present

## 2013-02-03 DIAGNOSIS — R531 Weakness: Secondary | ICD-10-CM

## 2013-02-03 LAB — COMPREHENSIVE METABOLIC PANEL
ALT: 14 U/L (ref 0–35)
AST: 13 U/L (ref 0–37)
Albumin: 3 g/dL — ABNORMAL LOW (ref 3.5–5.2)
Alkaline Phosphatase: 82 U/L (ref 39–117)
BUN: 33 mg/dL — AB (ref 6–23)
CALCIUM: 8.4 mg/dL (ref 8.4–10.5)
CO2: 23 mEq/L (ref 19–32)
Chloride: 110 mEq/L (ref 96–112)
Creatinine, Ser: 1.55 mg/dL — ABNORMAL HIGH (ref 0.50–1.10)
GFR calc Af Amer: 38 mL/min — ABNORMAL LOW (ref >90)
GFR calc non Af Amer: 33 mL/min — ABNORMAL LOW (ref >90)
GLUCOSE: 96 mg/dL (ref 70–99)
Potassium: 4.1 mEq/L (ref 3.7–5.3)
Sodium: 144 mEq/L (ref 137–147)
TOTAL PROTEIN: 6.7 g/dL (ref 6.0–8.3)
Total Bilirubin: 0.4 mg/dL (ref 0.3–1.2)

## 2013-02-03 LAB — CBC
HEMATOCRIT: 23.1 % — AB (ref 36.0–46.0)
HEMOGLOBIN: 7.8 g/dL — AB (ref 12.0–15.0)
MCH: 29 pg (ref 26.0–34.0)
MCHC: 33.8 g/dL (ref 30.0–36.0)
MCV: 85.9 fL (ref 78.0–100.0)
Platelets: 196 10*3/uL (ref 150–400)
RBC: 2.69 MIL/uL — ABNORMAL LOW (ref 3.87–5.11)
RDW: 15.7 % — ABNORMAL HIGH (ref 11.5–15.5)
WBC: 7.3 10*3/uL (ref 4.0–10.5)

## 2013-02-03 LAB — PROTIME-INR
INR: 1.19 (ref 0.00–1.49)
Prothrombin Time: 14.8 seconds (ref 11.6–15.2)

## 2013-02-03 LAB — APTT: aPTT: 38 seconds — ABNORMAL HIGH (ref 24–37)

## 2013-02-03 LAB — OCCULT BLOOD, POC DEVICE: FECAL OCCULT BLD: POSITIVE — AB

## 2013-02-03 NOTE — ED Notes (Signed)
Pt on the phone

## 2013-02-03 NOTE — ED Notes (Signed)
[name] [id number].Marland KitchenMarland Kitchen

## 2013-02-03 NOTE — ED Notes (Addendum)
Presents with GI bleeding, began 2 days ago, reports having bright red, dark and clots in commode, filling commode 3-4- times a day assocaited with weakness. History of same requiring transfusions. Pale mucous membranes.  Last BM one hour ago.

## 2013-02-03 NOTE — ED Notes (Signed)
Pt unable to stand long enough for standing orthostatic vitals

## 2013-02-03 NOTE — ED Notes (Signed)
MD gave pt permission to use her albuterol inhaler.

## 2013-02-03 NOTE — ED Notes (Signed)
Flow called and asked for update on pt's bed assignment, room needs to be cleaned, about 45 minutes.

## 2013-02-03 NOTE — ED Provider Notes (Signed)
CSN: [id number]     Arrival date & time 02/03/13  1652 History   First MD Initiated Contact with Patient 02/03/13 1725     Chief Complaint  Patient presents with  . GI Bleeding   (Consider location/radiation/quality/duration/timing/severity/associated sxs/prior Treatment) HPI Pt presenting with c/o rectal bleeding.  She states symptoms began 2 days ago intiially she saw bright red blood, then it seemed to slow down, then today had return of bright red blood.  No abdominal pain or cramping.  No fever/chills.  She has had hx of rectal bleeding and per chart review has been found to have diverticulosis.  States the blood clots have filled the toilet.  Has begun to feel weak and dizzy today.  No fainting.  No chest pain or shortness of breath.  There are no other associated systemic symptoms, there are no other alleviating or modifying factors.   Past Medical History  Diagnosis Date  . Arthritis   . Lumbar disc disease   . Asthma   . Depression   . Diabetes mellitus   . GERD (gastroesophageal reflux disease)   . Hypercholesterolemia   . HTN (hypertension)   . Peptic ulcer disease   . Valgus foot     right   . Gastric ulcer   . Adenomatous colon polyp   . Diverticulosis   . Internal hemorrhoids   . 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 Anemia   . Umbilical hernia   . 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 OSA (obstructive sleep apnea) 09/17/2012   Past Surgical History  Procedure Laterality Date  . Total knee arthroplasty      Bilateral  . Incise and drain abcess      Abdominal Wall  . Umbilical hernia repair  2007    incarcerated  . Lipoma resection      Right Neck  . Colonoscopy  2209 or 2010     2001, 2005, 2009  colonoscopy.  [place]omas  . Upper gastrointestinal endoscopy  2001    medoff Grade A esophagitis  . Colonoscopy N/A 08/14/2012    Procedure: COLONOSCOPY;  Surgeon: Ladene Artist, MD;  Location: [facility] ENDOSCOPY;  Service: Endoscopy;  Laterality: N/A;   Family  History  Problem Relation Age of Onset  . Breast cancer Maternal Aunt    History  Substance Use Topics  . Smoking status: Former Smoker -- 2.00 packs/day for 5 years    Types: Cigarettes    Quit date: 01/10/1958  . Smokeless tobacco: Never Used  . Alcohol Use: No     Comment: Quit when she quit smoking   OB History   Grav Para Term Preterm Abortions TAB SAB Ect Mult Living                 Review of Systems ROS reviewed and all otherwise negative except for mentioned in HPI  Allergies  Review of patient's allergies indicates no known allergies.  Home Medications   No current outpatient prescriptions on file. BP 150/74  Pulse 69  Temp(Src) 98.4 F (36.9 C) (Oral)  Resp 20  Ht 5\' 5"  (1.651 m)  Wt 231 lb 1.6 oz (104.826 kg)  BMI 38.46 kg/m2  SpO2 99% Vitals reviewed Physical Exam Physical Examination: General appearance - alert, well appearing, and in no distress Mental status - alert, oriented to person, place, and time Eyes - no scleral icterus, no conjunctival injection, some conjunctival pallor Mouth - mucous membranes moist, pharynx normal without lesions Chest -  clear to auscultation, no wheezes, rales or rhonchi, symmetric air entry Heart - normal rate, regular rhythm, normal S1, S2, no murmurs, rubs, clicks or gallops Abdomen - soft, nontender, nondistended, no masses or organomegaly Rectal - hemoccult positive, reddish stool Extremities - peripheral pulses normal, no pedal edema, no clubbing or cyanosis Skin - normal coloration and turgor, no rashes  ED Course  Procedures (including critical care time)  9:43 PM d/w Dr. Joya Salm, he will see patient in the ED for admission.   CRITICAL CARE Performed by: Threasa Beards Total critical care time: 35 Critical care time was exclusive of separately billable procedures and treating other patients. Critical care was necessary to treat or prevent imminent or life-threatening deterioration. Critical care was time  spent personally by me on the following activities: development of treatment plan with patient and/or surrogate as well as nursing, discussions with consultants, evaluation of patient's response to treatment, examination of patient, obtaining history from patient or surrogate, ordering and performing treatments and interventions, ordering and review of laboratory studies, ordering and review of radiographic studies, pulse oximetry and re-evaluation of patient's condition. Labs Review Labs Reviewed  CBC - Abnormal; Notable for the following:    RBC 2.69 (*)    Hemoglobin 7.8 (*)    HCT 23.1 (*)    RDW 15.7 (*)    All other components within normal limits  COMPREHENSIVE METABOLIC PANEL - Abnormal; Notable for the following:    BUN 33 (*)    Creatinine, Ser 1.55 (*)    Albumin 3.0 (*)    GFR calc non Af Amer 33 (*)    GFR calc Af Amer 38 (*)    All other components within normal limits  APTT - Abnormal; Notable for the following:    aPTT 38 (*)    All other components within normal limits  HEMOGLOBIN A1C - Abnormal; Notable for the following:    Hemoglobin A1C 6.1 (*)    Mean Plasma Glucose 128 (*)    All other components within normal limits  BASIC METABOLIC PANEL - Abnormal; Notable for the following:    Glucose, Bld 114 (*)    BUN 30 (*)    Creatinine, Ser 1.43 (*)    GFR calc non Af Amer 36 (*)    GFR calc Af Amer 42 (*)    All other components within normal limits  HEMOGLOBIN AND HEMATOCRIT, BLOOD - Abnormal; Notable for the following:    Hemoglobin 7.6 (*)    HCT 21.3 (*)    All other components within normal limits  HEMOGLOBIN AND HEMATOCRIT, BLOOD - Abnormal; Notable for the following:    Hemoglobin 9.5 (*)    HCT 27.0 (*)    All other components within normal limits  HEMOGLOBIN AND HEMATOCRIT, BLOOD - Abnormal; Notable for the following:    Hemoglobin 8.4 (*)    HCT 23.7 (*)    All other components within normal limits  HEMOGLOBIN AND HEMATOCRIT, BLOOD - Abnormal;  Notable for the following:    Hemoglobin 9.4 (*)    HCT 27.1 (*)    All other components within normal limits  GLUCOSE, CAPILLARY - Abnormal; Notable for the following:    Glucose-Capillary 108 (*)    All other components within normal limits  GLUCOSE, CAPILLARY - Abnormal; Notable for the following:    Glucose-Capillary 134 (*)    All other components within normal limits  BASIC METABOLIC PANEL - Abnormal; Notable for the following:    Glucose, Bld 158 (*)  BUN 29 (*)    Creatinine, Ser 1.51 (*)    GFR calc non Af Amer 34 (*)    GFR calc Af Amer 39 (*)    All other components within normal limits  HEMOGLOBIN AND HEMATOCRIT, BLOOD - Abnormal; Notable for the following:    Hemoglobin 9.7 (*)    HCT 27.4 (*)    All other components within normal limits  GLUCOSE, CAPILLARY - Abnormal; Notable for the following:    Glucose-Capillary 104 (*)    All other components within normal limits  GLUCOSE, CAPILLARY - Abnormal; Notable for the following:    Glucose-Capillary 161 (*)    All other components within normal limits  GLUCOSE, CAPILLARY - Abnormal; Notable for the following:    Glucose-Capillary 165 (*)    All other components within normal limits  GLUCOSE, CAPILLARY - Abnormal; Notable for the following:    Glucose-Capillary 184 (*)    All other components within normal limits  GLUCOSE, CAPILLARY - Abnormal; Notable for the following:    Glucose-Capillary 194 (*)    All other components within normal limits  OCCULT BLOOD, POC DEVICE - Abnormal; Notable for the following:    Fecal Occult Bld POSITIVE (*)    All other components within normal limits  PROTIME-INR  GLUCOSE, CAPILLARY  BASIC METABOLIC PANEL  CBC  TYPE AND SCREEN  PREPARE RBC (CROSSMATCH)   Imaging Review No results found.  EKG Interpretation    Date/Time:    Ventricular Rate:    PR Interval:    QRS Duration:   QT Interval:    QTC Calculation:   R Axis:     Text Interpretation:               MDM   1. Lower GI bleed   2. Anemia   3. Generalized weakness    Pt presenting with lower GI bleed, anemic associated with weakness. Pt treated with IV fluids, type and screen obtained.  D/w her PMD who will admit.  Pt has stable vital signs.     Threasa Beards, MD 02/06/13 [phone]

## 2013-02-03 NOTE — ED Notes (Signed)
Admitting MD at BS.  

## 2013-02-03 NOTE — ED Notes (Signed)
Pt undressed, in gown, on continuous pulse oximetry and blood pressure cuff; family at bedside 

## 2013-02-03 NOTE — ED Notes (Signed)
IV attempted X 2  IV team called.Marland Kitchen

## 2013-02-03 NOTE — ED Notes (Signed)
Pt denies N/V at this time.  

## 2013-02-03 NOTE — H&P (Signed)
PCP:   Geoffery Lyons, MD   Chief Complaint:  Bloody stools  HPI: [name] is a 73 year old with multiple chronic medical problems well-known to me to include hypertension, diabetes mellitus to with renal and neurologic complications, anemia of chronic disease, chronic asthma presenting at this time with at least 2 days of bloody stools. She had a similar episode back in August and was hospitalized resulting in a colonoscopy which revealed one polyp and diverticulosis. She's had no difficulty since that time but began noticing some bright red blood per Friday. Frequency has intensified as of today she's had multiple bloody stools and several including clot formation. This is now accompanied by weakness and lightheadedness she presented to the emergency room for more evaluation. In the emergency room she had another large bloody stool with clot formation. She has had a drop in hemoglobin and she is admitted for further management she's having no chest pain nausea or vomiting. [place] he swelling is at baseline. She is having lightheadedness and near syncopal type symptoms but no frank syncope.   Past Medical History: Past Medical History  Diagnosis Date  . Arthritis   . Lumbar disc disease   . Asthma   . Depression   . Diabetes mellitus   . GERD (gastroesophageal reflux disease)   . Hypercholesterolemia   . HTN (hypertension)   . Peptic ulcer disease   . Valgus foot     right   . Gastric ulcer   . Adenomatous colon polyp   . Diverticulosis   . Internal hemorrhoids   . 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 Anemia   . Umbilical hernia   . 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 OSA (obstructive sleep apnea) 09/17/2012   Past Surgical History  Procedure Laterality Date  . Total knee arthroplasty      Bilateral  . Incise and drain abcess      Abdominal Wall  . Umbilical hernia repair  2007    incarcerated  . Lipoma resection      Right Neck  . Colonoscopy  2209 or 2010    2001, 2005, 2009  colonoscopy.  [place]  . Upper gastrointestinal endoscopy  2001    medoff Grade A esophagitis  . Colonoscopy N/A 08/14/2012    Procedure: COLONOSCOPY;  Surgeon: Ladene Artist, MD;  Location: [facility] ENDOSCOPY;  Service: Endoscopy;  Laterality: N/A;    Medications: Prior to Admission medications   Medication Sig Start Date End Date Taking? Authorizing Provider  albuterol (PROAIR HFA) 108 (90 BASE) MCG/ACT inhaler Inhale 2 puffs into the lungs every 6 (six) hours as needed for wheezing. 04/20/11 07/23/13 Yes Chesley Mires, MD  albuterol (PROVENTIL) (2.5 MG/3ML) 0.083% nebulizer solution Take 3 mLs (2.5 mg total) by nebulization 4 (four) times daily. 10/13/11  Yes Chesley Mires, MD  amLODipine (NORVASC) 5 MG tablet Take 10 mg by mouth daily.    Yes Historical Provider, MD  Ascorbic Acid (VITAMIN C) 1000 MG tablet Take 1,000 mg by mouth daily.   Yes Historical Provider, MD  augmented betamethasone dipropionate (DIPROLENE-AF) 0.05 % cream Apply 1 application topically 2 (two) times daily. To both legs   Yes Historical Provider, MD  budesonide (PULMICORT) 0.25 MG/2ML nebulizer solution Take 2 mLs (0.25 mg total) by nebulization 2 (two) times daily. 11/15/11  Yes Chesley Mires, MD  Calcium Carbonate-Vitamin D (CALTRATE 600+D) 600-400 MG-UNIT per tablet Take 1 tablet by mouth daily.   Yes Historical Provider, MD  esomeprazole ([place])  40 MG capsule Take 40 mg by mouth daily before breakfast.    Yes Historical Provider, MD  ferrous sulfate 325 (65 FE) MG tablet Take 325 mg by mouth daily with breakfast.   Yes Historical Provider, MD  fluticasone (FLONASE) 50 MCG/ACT nasal spray Place 2 sprays into the nose daily.  03/14/12  Yes Chesley Mires, MD  folic acid (FOLVITE) A999333 MCG tablet Take 400 mcg by mouth daily.   Yes Historical Provider, MD  furosemide (LASIX) 40 MG tablet Take 80 mg by mouth daily.    Yes Historical Provider, MD  glipiZIDE (GLUCOTROL XL) 10 MG 24 hr tablet Take 10 mg by mouth daily.     Yes Historical Provider, MD  HUMALOG MIX 75/25 KWIKPEN (75-25) 100 UNIT/ML SUSP Inject 15 Units into the skin every morning. 09/01/11  Yes Historical Provider, MD  insulin glargine (LANTUS) 100 UNIT/ML injection Inject 35 Units into the skin daily.   Yes Historical Provider, MD  Magnesium 250 MG TABS Take 1 tablet by mouth daily.   Yes Historical Provider, MD  montelukast (SINGULAIR) 10 MG tablet Take 1 tablet (10 mg total) by mouth at bedtime. 12/04/12  Yes Chesley Mires, MD  Omega-3 Fatty Acids (FISH OIL) 1000 MG CAPS Take 1 capsule by mouth daily.   Yes Historical Provider, MD  potassium chloride SA (K-DUR,KLOR-CON) 20 MEQ tablet Take 20 mEq by mouth daily.   Yes Historical Provider, MD  prochlorperazine (COMPAZINE) 5 MG tablet Take 5 mg by mouth every 6 (six) hours as needed for nausea.   Yes Historical Provider, MD  Propylene Glycol (SYSTANE BALANCE OP) Apply 1 drop to eye 2 (two) times daily.   Yes Historical Provider, MD  pyridOXINE (VITAMIN B-6) 100 MG tablet Take 100 mg by mouth daily.   Yes Historical Provider, MD  simvastatin (ZOCOR) 20 MG tablet Take 20 mg by mouth every evening.   Yes Historical Provider, MD  traZODone (DESYREL) 50 MG tablet Take 1 tablet (50 mg total) by mouth at bedtime as needed for sleep. 12/04/12  Yes Chesley Mires, MD  vitamin B-12 (CYANOCOBALAMIN) 1000 MCG tablet Take 1,000 mcg by mouth daily.   Yes Historical Provider, MD  vitamin E 400 UNIT capsule Take 400 Units by mouth daily.   Yes Historical Provider, MD  polyethylene glycol (MIRALAX / GLYCOLAX) packet Take 17 g by mouth daily.    Historical Provider, MD    Allergies:  No Known Allergies  Social History:  reports that she quit smoking about 55 years ago. Her smoking use included Cigarettes. She has a 10 pack-year smoking history. She has never used smokeless tobacco. She reports that she does not drink alcohol or use illicit drugs.  Family History: Family History  Problem Relation Age of Onset  .  Breast cancer Maternal Aunt     Physical Exam: Filed Vitals:   02/03/13 1854 02/03/13 1857 02/03/13 1940 02/03/13 2215  BP: 199/71 198/73 185/54 164/58  Pulse: 72 72 68 62  Temp:      TempSrc:      Resp:   20 16  SpO2:   100% 98%   General appearance: alert, cooperative and no distress Head: Normocephalic, without obvious abnormality, atraumatic Eyes: conjunctivae/corneas clear. PERRL, EOM's intact.  Nose: Nares normal. Septum midline. Mucosa normal. No drainage or sinus tenderness. Throat: lips, mucosa, and tongue normal; teeth and gums normal Neck: no adenopathy, no carotid bruit, no JVD and thyroid not enlarged, symmetric, no tenderness/mass/nodules Resp: clear to auscultation bilaterally Cardio: regular  rate and rhythm, S1, S2 normal and systolic murmur: systolic ejection 2/6, crescendo at lower left sternal border GI: soft, non-tender; bowel sounds normal; no masses,  no organomegaly Extremities: extremities normal, atraumatic, no cyanosis, 1+ edema with venous stasis changes. Feet with bilateral Charcot changes Pulses: 2+ and symmetric Lymph nodes: Cervical adenopathy: no cervical lymphadenopathy Neurologic: Alert and oriented X 3, normal strength and tone. Normal symmetric reflexes.     Labs on Admission:   Recent Labs  02/03/13 1716  NA 144  K 4.1  CL 110  CO2 23  GLUCOSE 96  BUN 33*  CREATININE 1.55*  CALCIUM 8.4    Recent Labs  02/03/13 1716  AST 13  ALT 14  ALKPHOS 82  BILITOT 0.4  PROT 6.7  ALBUMIN 3.0*   No results found for this basename: LIPASE, AMYLASE,  in the last 72 hours  Recent Labs  02/03/13 1716  WBC 7.3  HGB 7.8*  HCT 23.1*  MCV 85.9  PLT 196   No results found for this basename: CKTOTAL, CKMB, CKMBINDEX, TROPONINI,  in the last 72 hours No results found for this basename: TSH, T4TOTAL, FREET3, T3FREE, THYROIDAB,  in the last 72 hours No results found for this basename: VITAMINB12, FOLATE, FERRITIN, TIBC, IRON, RETICCTPCT,  in  the last 72 hours  Radiological Exams on Admission: No results found. Orders placed during the hospital encounter of 08/12/12  . ED EKG  . ED EKG  . EKG 12-LEAD  . EKG 12-LEAD  . EKG 12-LEAD  . EKG 12-LEAD  . EKG    Assessment/Plan Active Problems: 1.  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 Patient is arty had a full endoscopic workup there for doubly this be necessary at this time.   Rosaleigh Brazzel A 02/03/2013, 10:47 PM

## 2013-02-04 ENCOUNTER — Encounter (HOSPITAL_COMMUNITY): Payer: Self-pay | Admitting: *Deleted

## 2013-02-04 LAB — HEMOGLOBIN AND HEMATOCRIT, BLOOD
HCT: 21.3 % — ABNORMAL LOW (ref 36.0–46.0)
HCT: 27.1 % — ABNORMAL LOW (ref 36.0–46.0)
HEMATOCRIT: 27 % — AB (ref 36.0–46.0)
HEMATOCRIT: 23.7 % — AB (ref 36.0–46.0)
HEMOGLOBIN: 8.4 g/dL — AB (ref 12.0–15.0)
HEMOGLOBIN: 9.4 g/dL — AB (ref 12.0–15.0)
Hemoglobin: 7.6 g/dL — ABNORMAL LOW (ref 12.0–15.0)
Hemoglobin: 9.5 g/dL — ABNORMAL LOW (ref 12.0–15.0)

## 2013-02-04 LAB — GLUCOSE, CAPILLARY
GLUCOSE-CAPILLARY: 108 mg/dL — AB (ref 70–99)
Glucose-Capillary: 134 mg/dL — ABNORMAL HIGH (ref 70–99)
Glucose-Capillary: 73 mg/dL (ref 70–99)
Glucose-Capillary: 104 mg/dL — ABNORMAL HIGH (ref 70–99)

## 2013-02-04 LAB — BASIC METABOLIC PANEL
BUN: 30 mg/dL — AB (ref 6–23)
CO2: 21 meq/L (ref 19–32)
CREATININE: 1.43 mg/dL — AB (ref 0.50–1.10)
Calcium: 8.4 mg/dL (ref 8.4–10.5)
Chloride: 110 mEq/L (ref 96–112)
GFR calc Af Amer: 42 mL/min — ABNORMAL LOW (ref >90)
GFR calc non Af Amer: 36 mL/min — ABNORMAL LOW (ref >90)
GLUCOSE: 114 mg/dL — AB (ref 70–99)
Potassium: 4.2 mEq/L (ref 3.7–5.3)
Sodium: 145 mEq/L (ref 137–147)

## 2013-02-04 LAB — HEMOGLOBIN A1C
Hgb A1c MFr Bld: 6.1 % — ABNORMAL HIGH (ref <5.7)
Mean Plasma Glucose: 128 mg/dL — ABNORMAL HIGH (ref <117)

## 2013-02-04 LAB — PREPARE RBC (CROSSMATCH)

## 2013-02-04 MED ORDER — HYDRALAZINE HCL 20 MG/ML IJ SOLN
10.0000 mg | Freq: Four times a day (QID) | INTRAMUSCULAR | Status: DC | PRN
  Administered 2013-02-04 – 2013-02-05 (×2): 10 mg via INTRAVENOUS
  Filled 2013-02-04 (×2): qty 1

## 2013-02-04 MED ORDER — METHYLPREDNISOLONE SODIUM SUCC 125 MG IJ SOLR
60.0000 mg | Freq: Four times a day (QID) | INTRAMUSCULAR | Status: DC
  Administered 2013-02-04 – 2013-02-06 (×6): 60 mg via INTRAVENOUS
  Filled 2013-02-04 (×10): qty 0.96

## 2013-02-04 MED ORDER — ACETAMINOPHEN 650 MG RE SUPP: 650.0000 mg | Freq: Four times a day (QID) | RECTAL | Status: DC | PRN

## 2013-02-04 MED ORDER — ONDANSETRON HCL 4 MG PO TABS: 4.0000 mg | ORAL_TABLET | Freq: Four times a day (QID) | ORAL | Status: DC | PRN

## 2013-02-04 MED ORDER — TRAZODONE HCL 50 MG PO TABS
50.0000 mg | ORAL_TABLET | Freq: Every evening | ORAL | Status: DC | PRN
  Filled 2013-02-04: qty 1

## 2013-02-04 MED ORDER — BUDESONIDE 0.25 MG/2ML IN SUSP
0.2500 mg | Freq: Two times a day (BID) | RESPIRATORY_TRACT | Status: DC
  Administered 2013-02-04 – 2013-02-07 (×5): 0.25 mg via RESPIRATORY_TRACT
  Filled 2013-02-04 (×8): qty 2

## 2013-02-04 MED ORDER — ZOLPIDEM TARTRATE 5 MG PO TABS: 5.0000 mg | ORAL_TABLET | Freq: Every evening | ORAL | Status: DC | PRN

## 2013-02-04 MED ORDER — GLIPIZIDE ER 10 MG PO TB24
10.0000 mg | ORAL_TABLET | Freq: Every day | ORAL | Status: DC
  Administered 2013-02-04 – 2013-02-06 (×3): 10 mg via ORAL
  Filled 2013-02-04 (×5): qty 1

## 2013-02-04 MED ORDER — ONDANSETRON HCL 4 MG/2ML IJ SOLN: 4.0000 mg | Freq: Four times a day (QID) | INTRAMUSCULAR | Status: DC | PRN

## 2013-02-04 MED ORDER — SIMVASTATIN 20 MG PO TABS
20.0000 mg | ORAL_TABLET | Freq: Every evening | ORAL | Status: DC
  Administered 2013-02-05 – 2013-02-07 (×3): 20 mg via ORAL
  Filled 2013-02-04 (×4): qty 1

## 2013-02-04 MED ORDER — AMLODIPINE BESYLATE 10 MG PO TABS
10.0000 mg | ORAL_TABLET | Freq: Every day | ORAL | Status: DC
  Administered 2013-02-04 – 2013-02-07 (×4): 10 mg via ORAL
  Filled 2013-02-04 (×4): qty 1

## 2013-02-04 MED ORDER — ALBUTEROL SULFATE (2.5 MG/3ML) 0.083% IN NEBU
3.0000 mL | INHALATION_SOLUTION | Freq: Four times a day (QID) | RESPIRATORY_TRACT | Status: DC | PRN
  Administered 2013-02-04: 3 mL via RESPIRATORY_TRACT
  Filled 2013-02-04: qty 3

## 2013-02-04 MED ORDER — INSULIN ASPART 100 UNIT/ML ~~LOC~~ SOLN
0.0000 [IU] | Freq: Three times a day (TID) | SUBCUTANEOUS | Status: DC
  Administered 2013-02-04: 2 [IU] via SUBCUTANEOUS
  Administered 2013-02-05 – 2013-02-06 (×5): 3 [IU] via SUBCUTANEOUS
  Administered 2013-02-06: 2 [IU] via SUBCUTANEOUS

## 2013-02-04 MED ORDER — SODIUM CHLORIDE 0.9 % IV SOLN
INTRAVENOUS | Status: DC
  Administered 2013-02-04: 02:00:00 via INTRAVENOUS

## 2013-02-04 MED ORDER — LOSARTAN POTASSIUM 50 MG PO TABS
50.0000 mg | ORAL_TABLET | Freq: Every day | ORAL | Status: DC
  Administered 2013-02-04 – 2013-02-07 (×4): 50 mg via ORAL
  Filled 2013-02-04 (×4): qty 1

## 2013-02-04 MED ORDER — HYDRALAZINE HCL 20 MG/ML IJ SOLN
INTRAMUSCULAR | Status: AC
  Filled 2013-02-04: qty 1

## 2013-02-04 MED ORDER — INSULIN GLARGINE 100 UNIT/ML ~~LOC~~ SOLN
20.0000 [IU] | Freq: Every day | SUBCUTANEOUS | Status: DC
  Administered 2013-02-04 – 2013-02-06 (×4): 20 [IU] via SUBCUTANEOUS
  Filled 2013-02-04 (×5): qty 0.2

## 2013-02-04 MED ORDER — SODIUM CHLORIDE 0.9 % IV SOLN
INTRAVENOUS | Status: DC
  Administered 2013-02-04: 15 mL/h via INTRAVENOUS
  Administered 2013-02-04: 23:00:00 via INTRAVENOUS
  Administered 2013-02-06: 20 mL/h via INTRAVENOUS

## 2013-02-04 MED ORDER — FUROSEMIDE 80 MG PO TABS
80.0000 mg | ORAL_TABLET | Freq: Every day | ORAL | Status: DC
  Administered 2013-02-04 – 2013-02-07 (×4): 80 mg via ORAL
  Filled 2013-02-04 (×4): qty 1

## 2013-02-04 MED ORDER — FUROSEMIDE 10 MG/ML IJ SOLN
20.0000 mg | Freq: Once | INTRAMUSCULAR | Status: AC
  Administered 2013-02-04: 20 mg via INTRAVENOUS

## 2013-02-04 MED ORDER — ACETAMINOPHEN 325 MG PO TABS: 650.0000 mg | ORAL_TABLET | Freq: Four times a day (QID) | ORAL | Status: DC | PRN

## 2013-02-04 MED ORDER — PANTOPRAZOLE SODIUM 40 MG PO TBEC
40.0000 mg | DELAYED_RELEASE_TABLET | Freq: Every day | ORAL | Status: DC
  Administered 2013-02-04 – 2013-02-07 (×4): 40 mg via ORAL
  Filled 2013-02-04 (×3): qty 1

## 2013-02-04 MED ORDER — POTASSIUM CHLORIDE CRYS ER 20 MEQ PO TBCR
20.0000 meq | EXTENDED_RELEASE_TABLET | Freq: Every day | ORAL | Status: DC
  Administered 2013-02-04 – 2013-02-07 (×4): 20 meq via ORAL
  Filled 2013-02-04 (×4): qty 1

## 2013-02-04 MED ORDER — HYDRALAZINE HCL 20 MG/ML IJ SOLN
10.0000 mg | Freq: Once | INTRAMUSCULAR | Status: AC
  Administered 2013-02-04: 11:00:00 via INTRAVENOUS

## 2013-02-04 MED ORDER — ALBUTEROL SULFATE HFA 108 (90 BASE) MCG/ACT IN AERS
2.0000 | INHALATION_SPRAY | RESPIRATORY_TRACT | Status: DC | PRN
  Administered 2013-02-04 – 2013-02-07 (×3): 2 via RESPIRATORY_TRACT
  Filled 2013-02-04 (×2): qty 6.7

## 2013-02-04 MED ORDER — ALBUTEROL SULFATE (2.5 MG/3ML) 0.083% IN NEBU
2.5000 mg | INHALATION_SOLUTION | Freq: Four times a day (QID) | RESPIRATORY_TRACT | Status: DC
  Administered 2013-02-04: 2.5 mg via RESPIRATORY_TRACT
  Filled 2013-02-04 (×2): qty 3

## 2013-02-04 MED ORDER — ALBUTEROL SULFATE HFA 108 (90 BASE) MCG/ACT IN AERS: 2.0000 | INHALATION_SPRAY | RESPIRATORY_TRACT | Status: DC | PRN

## 2013-02-04 MED ORDER — ALBUTEROL SULFATE (2.5 MG/3ML) 0.083% IN NEBU: 2.5000 mg | INHALATION_SOLUTION | RESPIRATORY_TRACT | Status: DC | PRN

## 2013-02-04 NOTE — Progress Notes (Signed)
MD called, made aware of pt Bp. Commented no need to call with pt Bp.

## 2013-02-04 NOTE — Progress Notes (Signed)
MD on call returned call. Orders for solu-medrol 60mg  q 6 and pro-air 3 puffs, 4 times daily. RN will put in order

## 2013-02-04 NOTE — Progress Notes (Signed)
Pt Bp is elevated. Called MD office, awaiting to call back.

## 2013-02-04 NOTE — Progress Notes (Signed)
Patient stated that her albuterol (proventil) was not the same as pro-air. RN typed in pro-air and Proventil came up. RN will not be putting order in for pro-air, for it is already in patient's MAR. Will call respiratory to give treatment

## 2013-02-04 NOTE — Progress Notes (Signed)
Pt had a BM prior to BT completion. Noted blood clots in stool - minimal.

## 2013-02-04 NOTE — Progress Notes (Signed)
Pt arrived on floor. Alet and oriented. VS taken. Able to make needs known. Call light place within reached. Oriented to room and staff. We will continue to monitor.

## 2013-02-04 NOTE — Progress Notes (Signed)
BP was 198/70 P69 MD notified

## 2013-02-04 NOTE — Progress Notes (Signed)
Called ED to get pt report, unable to speak RN. Awaiting RN to call back.

## 2013-02-04 NOTE — Progress Notes (Signed)
Pt completed 1PRBC BT, able to tolerate well. No adverse reaction noted. Will continue to monitor.

## 2013-02-04 NOTE — Progress Notes (Signed)
Pt initiated BT. Will continue to monitor.

## 2013-02-04 NOTE — Progress Notes (Signed)
Subjective: Well, no concern Still bloody stool and weak--some dyspnea  Objective: Vital signs in last 24 hours: Temp:  [97.7 F (36.5 C)-99.4 F (37.4 C)] 98.7 F (37.1 C) (01/26 1211) Pulse Rate:  [62-72] 68 (01/26 1211) Resp:  [16-28] 22 (01/26 1211) BP: (140-199)/(54-92) 140/75 mmHg (01/26 1211) SpO2:  [94 %-100 %] 96 % (01/26 1211) Weight:  [104.826 kg (231 lb 1.6 oz)] 104.826 kg (231 lb 1.6 oz) (01/26 0253) Weight change:   CBG (last 3)   Recent Labs  02/04/13 0801 02/04/13 1153  GLUCAP 108* 134*    Intake/Output from previous day: 01/25 0701 - 01/26 0700 In: 387.5 [Blood:387.5] Out: -   Physical Exam: Awake, alert, no distress No jvd Chest clear, decrease bs at bases Abdomen soft and nontender 1+edema, stasis Neuro normal   Lab Results:  Recent Labs  02/03/13 1716 02/04/13 0755  NA 144 145  K 4.1 4.2  CL 110 110  CO2 23 21  GLUCOSE 96 114*  BUN 33* 30*  CREATININE 1.55* 1.43*  CALCIUM 8.4 8.4    Recent Labs  02/03/13 1716  AST 13  ALT 14  ALKPHOS 82  BILITOT 0.4  PROT 6.7  ALBUMIN 3.0*    Recent Labs  02/03/13 1716  WBC 7.3  HGB 7.8*  HCT 23.1*  MCV 85.9  PLT 196   Lab Results  Component Value Date   INR 1.19 02/03/2013   INR 1.16 08/12/2012   INR 1.04 07/14/2009   No results found for this basename: CKTOTAL, CKMB, CKMBINDEX, TROPONINI,  in the last 72 hours No results found for this basename: TSH, T4TOTAL, FREET3, T3FREE, THYROIDAB,  in the last 72 hours No results found for this basename: VITAMINB12, FOLATE, FERRITIN, TIBC, IRON, RETICCTPCT,  in the last 72 hours  Studies/Results: No results found.   Assessment/Plan: 1. 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

## 2013-02-04 NOTE — Progress Notes (Signed)
RN received pt report from [facility].

## 2013-02-04 NOTE — Progress Notes (Signed)
Patient states that she is having a very hard time breathing and that her albuterol is not working. She stated to RN that she needs pro-air. RN has paged on call MD and is awaiting call back

## 2013-02-05 LAB — HEMOGLOBIN AND HEMATOCRIT, BLOOD
HCT: 27.4 % — ABNORMAL LOW (ref 36.0–46.0)
Hemoglobin: 9.7 g/dL — ABNORMAL LOW (ref 12.0–15.0)

## 2013-02-05 LAB — GLUCOSE, CAPILLARY
Glucose-Capillary: 161 mg/dL — ABNORMAL HIGH (ref 70–99)
Glucose-Capillary: 165 mg/dL — ABNORMAL HIGH (ref 70–99)
Glucose-Capillary: 184 mg/dL — ABNORMAL HIGH (ref 70–99)
Glucose-Capillary: 194 mg/dL — ABNORMAL HIGH (ref 70–99)

## 2013-02-05 LAB — TYPE AND SCREEN
ABO/RH(D): B POS
Antibody Screen: NEGATIVE
UNIT DIVISION: 0
Unit division: 0

## 2013-02-05 LAB — BASIC METABOLIC PANEL
BUN: 29 mg/dL — AB (ref 6–23)
CO2: 20 mEq/L (ref 19–32)
CREATININE: 1.51 mg/dL — AB (ref 0.50–1.10)
Calcium: 8.8 mg/dL (ref 8.4–10.5)
Chloride: 105 mEq/L (ref 96–112)
GFR calc non Af Amer: 34 mL/min — ABNORMAL LOW (ref >90)
GFR, EST AFRICAN AMERICAN: 39 mL/min — AB (ref >90)
GLUCOSE: 158 mg/dL — AB (ref 70–99)
Potassium: 4.4 mEq/L (ref 3.7–5.3)
Sodium: 141 mEq/L (ref 137–147)

## 2013-02-05 NOTE — Evaluation (Signed)
Physical Therapy Evaluation Patient Details Name: [name] MRN: [id number] DOB: 1940/06/05 Today's Date: 02/05/2013 Time: [id number] PT Time Calculation (min): 25 min  PT Assessment / Plan / Recommendation History of Present Illness  [name] is a 73 year old with multiple chronic medical problems well-known to me to include hypertension, diabetes mellitus to with renal and neurologic complications, anemia of chronic disease, chronic asthma presenting at this time with at least 2 days of bloody stools.  Clinical Impression  Patient presents with decreased independence with mobility due to deficits listed below and will benefit from skilled PT in the acute setting to allow return home independent and occasional assist from friends.  May benefit from [place] at d/c.      PT Assessment  Patient needs continued PT services    Follow Up Recommendations  Home health PT;Supervision - Intermittent    Does the patient have the potential to tolerate intense rehabilitation    N/A  Barriers to Discharge Decreased caregiver support      Equipment Recommendations  None recommended by PT    Recommendations for Other Services OT consult   Frequency Min 3X/week    Precautions / Restrictions Precautions Precautions: Fall Precaution Comments: on O2   Pertinent Vitals/Pain No pain complaints; dyspneic with ambulation and bed mobility on 2L O2      Mobility  Bed Mobility Overal bed mobility: Modified Independent Transfers Overall transfer level: Modified independent Equipment used: 4-wheeled walker General transfer comment: uses momentum strategy Ambulation/Gait Ambulation/Gait assistance: Supervision Ambulation Distance (Feet): 20 Feet Assistive device: 4-wheeled walker Gait Pattern/deviations: Step-through pattern;Trunk flexed;Shuffle;Wide base of support;Decreased stride length General Gait Details: assist with O2 lines and IV pole and for safety especially on turns when she gets  outside the walker; stayed in room to/from door twice due to fatigued from toileting, doesn't have her shoes and reach of O2 tubing    Exercises     PT Diagnosis: Abnormality of gait;Generalized weakness  PT Problem List: Decreased strength;Decreased activity tolerance;Decreased balance;Decreased mobility PT Treatment Interventions: DME instruction;Balance training;Gait training;Functional mobility training;Patient/family education;Therapeutic activities;Therapeutic exercise     PT Goals(Current goals can be found in the care plan section) Acute Rehab PT Goals Patient Stated Goal: To return to independent PT Goal Formulation: With patient Time For Goal Achievement: 02/19/13 Potential to Achieve Goals: Good  Visit Information  Last PT Received On: 02/05/13 Assistance Needed: +1 History of Present Illness: [name] is a 73 year old with multiple chronic medical problems well-known to me to include hypertension, diabetes mellitus to with renal and neurologic complications, anemia of chronic disease, chronic asthma presenting at this time with at least 2 days of bloody stools.       Prior [place] expects to be discharged to:: Private residence Living Arrangements: Alone Available Help at Discharge: Available PRN/intermittently;Friend(s) Type of Home: House Home Access: Level entry Home Layout: One [place]: [place] - 4 wheels;Walker - 2 wheels;Shower seat;Bedside commode;Other (comment) (oxygen at home, CPAP) Prior Function Level of Independence: Independent with assistive device(s) Communication Communication: No difficulties Dominant Hand: Right    Cognition  Cognition Arousal/Alertness: Awake/alert Behavior During Therapy: WFL for tasks assessed/performed Overall Cognitive Status: Within Functional Limits for tasks assessed    Extremity/Trunk Assessment Lower Extremity Assessment Lower Extremity Assessment: Overall WFL for tasks  assessed Cervical / Trunk Assessment Cervical / Trunk Assessment: Kyphotic   Balance Balance Overall balance assessment: Needs assistance Sitting balance-Leahy Scale: Good Standing balance support: Single extremity supported;Bilateral upper extremity supported;No upper extremity  supported Standing balance-Leahy Scale: Fair Standing balance comment: at times holding walker or rail of bed, then walked few steps to bed without devices hunched over throughout and difficulty picking up legs to step due to heavy legs  End of Session PT - End of Session Equipment Utilized During Treatment: Oxygen Activity Tolerance: Patient limited by fatigue Patient left: in bed;with call bell/phone within reach  GP     [facility] 02/05/2013, 4:08 PM [place], [place] 02/05/2013

## 2013-02-05 NOTE — Progress Notes (Signed)
Subjective: Discussed inhalers-resp good. Rectal bleeding more intermittent  Objective: Vital signs in last 24 hours: Temp:  [98 F (36.7 C)-99.4 F (37.4 C)] 98.5 F (36.9 C) (01/27 0340) Pulse Rate:  [65-87] 87 (01/27 0631) Resp:  [19-24] 20 (01/27 0340) BP: (140-198)/(55-80) 180/60 mmHg (01/27 0631) SpO2:  [95 %-99 %] 97 % (01/27 0340) Weight change:   CBG (last 3)   Recent Labs  02/04/13 1813 02/04/13 2054 02/05/13 0746  GLUCAP 73 104* 161*    Intake/Output from previous day: 01/26 0701 - 01/27 0700 In: 798 [P.O.:360; I.V.:113; Blood:325] Out: -   Physical Exam:  Awake, alert, no distress  No jvd  Chest clear, decrease bs at bases  Abdomen soft and nontender  1+edema, stasis  Neuro normal      Lab Results:  Recent Labs  02/04/13 0755 02/05/13 0706  NA 145 141  K 4.2 4.4  CL 110 105  CO2 21 20  GLUCOSE 114* 158*  BUN 30* 29*  CREATININE 1.43* 1.51*  CALCIUM 8.4 8.8    Recent Labs  02/03/13 1716  AST 13  ALT 14  ALKPHOS 82  BILITOT 0.4  PROT 6.7  ALBUMIN 3.0*    Recent Labs  02/03/13 1716  02/04/13 2305 02/05/13 0815  WBC 7.3  --   --   --   HGB 7.8*  < > 9.4* 9.7*  HCT 23.1*  < > 27.1* 27.4*  MCV 85.9  --   --   --   PLT 196  --   --   --   < > = values in this interval not displayed. Lab Results  Component Value Date   INR 1.19 02/03/2013   INR 1.16 08/12/2012   INR 1.04 07/14/2009   No results found for this basename: CKTOTAL, CKMB, CKMBINDEX, TROPONINI,  in the last 72 hours No results found for this basename: TSH, T4TOTAL, FREET3, T3FREE, THYROIDAB,  in the last 72 hours No results found for this basename: VITAMINB12, FOLATE, FERRITIN, TIBC, IRON, RETICCTPCT,  in the last 72 hours  Studies/Results: No results found.   Assessment/Plan:  1. 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

## 2013-02-06 LAB — BASIC METABOLIC PANEL
BUN: 37 mg/dL — ABNORMAL HIGH (ref 6–23)
CALCIUM: 8 mg/dL — AB (ref 8.4–10.5)
CO2: 21 meq/L (ref 19–32)
Chloride: 107 mEq/L (ref 96–112)
Creatinine, Ser: 1.72 mg/dL — ABNORMAL HIGH (ref 0.50–1.10)
GFR calc Af Amer: 33 mL/min — ABNORMAL LOW (ref >90)
GFR calc non Af Amer: 29 mL/min — ABNORMAL LOW (ref >90)
Glucose, Bld: 189 mg/dL — ABNORMAL HIGH (ref 70–99)
POTASSIUM: 4.7 meq/L (ref 3.7–5.3)
SODIUM: 142 meq/L (ref 137–147)

## 2013-02-06 LAB — CBC
HCT: 25.8 % — ABNORMAL LOW (ref 36.0–46.0)
HEMOGLOBIN: 9.1 g/dL — AB (ref 12.0–15.0)
MCH: 29.9 pg (ref 26.0–34.0)
MCHC: 35.3 g/dL (ref 30.0–36.0)
MCV: 84.9 fL (ref 78.0–100.0)
Platelets: 189 10*3/uL (ref 150–400)
RBC: 3.04 MIL/uL — AB (ref 3.87–5.11)
RDW: 17.2 % — ABNORMAL HIGH (ref 11.5–15.5)
WBC: 10.2 10*3/uL (ref 4.0–10.5)

## 2013-02-06 LAB — GLUCOSE, CAPILLARY
GLUCOSE-CAPILLARY: 166 mg/dL — AB (ref 70–99)
GLUCOSE-CAPILLARY: 151 mg/dL — AB (ref 70–99)
Glucose-Capillary: 129 mg/dL — ABNORMAL HIGH (ref 70–99)
Glucose-Capillary: 147 mg/dL — ABNORMAL HIGH (ref 70–99)
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
## 2013-02-06 NOTE — Progress Notes (Signed)
Subjective: Well. No concerns. She reports no further blood  Objective: Vital signs in last 24 hours: Temp:  [98.4 F (36.9 C)-98.6 F (37 C)] 98.4 F (36.9 C) (01/28 0630) Pulse Rate:  [67-91] 69 (01/28 0630) Resp:  [18-20] 20 (01/28 0630) BP: (150-184)/(63-74) 150/74 mmHg (01/28 0630) SpO2:  [95 %-100 %] 100 % (01/28 0827) Weight change:   CBG (last 3)   Recent Labs  02/05/13 1720 02/05/13 2155 02/06/13 0752  GLUCAP 184* 194* 166*    Intake/Output from previous day: 01/27 0701 - 01/28 0700 In: 1035.5 [P.O.:680; I.V.:355.5] Out: -   Physical Exam:  Awake, alert, no distress  No jvd  Chest clear, decrease bs at bases  Abdomen soft and nontender  1+edema, stasis  Neuro normal    Lab Results:  Recent Labs  02/05/13 0706 02/06/13 0905  NA 141 142  K 4.4 4.7  CL 105 107  CO2 20 21  GLUCOSE 158* 189*  BUN 29* 37*  CREATININE 1.51* 1.72*  CALCIUM 8.8 8.0*    Recent Labs  02/03/13 1716  AST 13  ALT 14  ALKPHOS 82  BILITOT 0.4  PROT 6.7  ALBUMIN 3.0*    Recent Labs  02/03/13 1716  02/05/13 0815 02/06/13 0905  WBC 7.3  --   --  10.2  HGB 7.8*  < > 9.7* 9.1*  HCT 23.1*  < > 27.4* 25.8*  MCV 85.9  --   --  84.9  PLT 196  --   --  189  < > = values in this interval not displayed. Lab Results  Component Value Date   INR 1.19 02/03/2013   INR 1.16 08/12/2012   INR 1.04 07/14/2009   No results found for this basename: CKTOTAL, CKMB, CKMBINDEX, TROPONINI,  in the last 72 hours No results found for this basename: TSH, T4TOTAL, FREET3, T3FREE, THYROIDAB,  in the last 72 hours No results found for this basename: VITAMINB12, FOLATE, FERRITIN, TIBC, IRON, RETICCTPCT,  in the last 72 hours  Studies/Results: No results found.   Assessment/Plan: 1. GI bleed- bleed compatible with a diverticular bleed clearly symptomatic at this time. 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

## 2013-02-06 NOTE — Care Management Note (Signed)
    Page 1 of 2   02/08/2013     10:22:10 AM   CARE MANAGEMENT NOTE 02/08/2013  Patient:  [name], [name]   Account Number:  [id number]  Date Initiated:  02/05/2013  Documentation initiated by:  [name]  Subjective/Objective Assessment:   dx gib  admit- lives alone.     Action/Plan:   pt/ot eval- rec hhpt   Anticipated DC Date:  02/07/2013   Anticipated DC Plan:  [place]  CM consult      [facility] Choice  HOME HEALTH   Choice offered to / List presented to:  C-1 Patient        [place] arranged  [place].   Status of service:  Completed, signed off Medicare Important Message given?   (If response is "NO", the following Medicare IM given date fields will be blank) Date Medicare IM given:   Date Additional Medicare IM given:    Discharge Disposition:  [place]  Per UR Regulation:  Reviewed for med. necessity/level of care/duration of stay  If discussed at [place] of Stay Meetings, dates discussed:    Comments:  02/07/13 14:07 Nicole Bamberger RN, BSN [phone] patient chose [facility] for hh[facility]/ot, patient for dc today, Await MD order for hhpt/ot.  MD states to go ahead and send in referral, they will take care of the rest at his office . NCM called [facility] and left message to go ahead with referral. Soc will began 24-48 hrs post discharge.  02/05/13 Nicole Saupe RN, BSN [phone] patient lives alone, per physical therapy eval recs hhpt/ot.  NCM will offer patient agency choice for hhpt.

## 2013-02-06 NOTE — Progress Notes (Signed)
Physical Therapy Treatment Patient Details Name: [name] MRN: [id number] DOB: [birth year] Today's Date: 02/06/2013 Time: [id number] PT Time Calculation (min): 24 min  PT Assessment / Plan / Recommendation  History of Present Illness [name] is a 73 year old with multiple chronic medical problems well-known to me to include hypertension, diabetes mellitus to with renal and neurologic complications, anemia of chronic disease, chronic asthma presenting at this time with at least 2 days of bloody stools.   PT Comments   Pt is progressing towards goals, ambulated 100' with RW and supervision, though she continues to be a high fall risk. O2 sats remained 94% on RA despite 2/4 DOE. Pt's right handbreak is broken on her RW and needs to be fixed to increase her safety. PT will continue to follow.   Follow Up Recommendations  Home health PT;Supervision - Intermittent     Does the patient have the potential to tolerate intense rehabilitation     Barriers to Discharge        Equipment Recommendations  None recommended by PT (needs to have break fixed on 4-wheel RW)    Recommendations for Other Services    Frequency Min 3X/week   Progress towards PT Goals Progress towards PT goals: Progressing toward goals  Plan Current plan remains appropriate    Precautions / Restrictions Precautions Precautions: Fall Precaution Comments: on O2 PRN at home Restrictions Weight Bearing Restrictions: No   Pertinent Vitals/Pain See impression above    Mobility  Bed Mobility Overal bed mobility: Modified Independent Transfers Overall transfer level: Needs assistance Equipment used: 4-wheeled walker Transfers: Sit to/from Stand Sit to Stand: Min guard General transfer comment: uses momentum to stand, min-guard from low surface.  Ambulation/Gait Ambulation/Gait assistance: Supervision Ambulation Distance (Feet): 100 Feet Assistive device: 4-wheeled walker Gait Pattern/deviations: Step-through  pattern;Shuffle;Trunk flexed;Wide base of support Gait velocity: decreased Gait velocity interpretation: <1.8 ft/sec, indicative of risk for recurrent falls General Gait Details: pt ambulated without O2, increased WOB but O2 sats remained 94%. Pt with increased eversion bilateral feet. 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 Shoes donned for ambulation which helped increase distance from yesterday.    Exercises     PT Diagnosis:    PT Problem List:   PT Treatment Interventions:     PT Goals (current goals can now be found in the care plan section) Acute Rehab PT Goals Patient Stated Goal: To return to independent PT Goal Formulation: With patient Time For Goal Achievement: 02/19/13 Potential to Achieve Goals: Good  Visit Information  Last PT Received On: 02/06/13 Assistance Needed: +1 History of Present Illness: [name] is a 73 year old with multiple chronic medical problems well-known to me to include hypertension, diabetes mellitus to with renal and neurologic complications, anemia of chronic disease, chronic asthma presenting at this time with at least 2 days of bloody stools.    Subjective Data  Subjective: pt wishes she could get a new RW as her right handbreak is broken Patient Stated Goal: To return to independent   Cognition  Cognition Arousal/Alertness: Awake/alert Behavior During Therapy: WFL for tasks assessed/performed Overall Cognitive Status: Within Functional Limits for tasks assessed    Balance  Balance Overall balance assessment: Needs assistance Standing balance support: Bilateral upper extremity supported;During functional activity Standing balance-Leahy Scale: Fair Standing balance comment: needs UE support for safety with standing due to kyphotic posture  End of Session PT - End of Session Equipment Utilized During Treatment: Gait  belt Activity Tolerance: Patient limited by  fatigue Patient left: in bed;with call bell/phone within reach Nurse Communication: Mobility status   GP   Leighton Roach, [place]  [place], [place] 02/06/2013, 3:55 PM

## 2013-02-07 LAB — CBC
HCT: 23.9 % — ABNORMAL LOW (ref 36.0–46.0)
Hemoglobin: 8.3 g/dL — ABNORMAL LOW (ref 12.0–15.0)
MCH: 29.6 pg (ref 26.0–34.0)
MCHC: 34.7 g/dL (ref 30.0–36.0)
MCV: 85.4 fL (ref 78.0–100.0)
PLATELETS: 213 10*3/uL (ref 150–400)
RBC: 2.8 MIL/uL — ABNORMAL LOW (ref 3.87–5.11)
RDW: 17.5 % — AB (ref 11.5–15.5)
WBC: 11.9 10*3/uL — AB (ref 4.0–10.5)

## 2013-02-07 LAB — BASIC METABOLIC PANEL
BUN: 54 mg/dL — ABNORMAL HIGH (ref 6–23)
CALCIUM: 7.5 mg/dL — AB (ref 8.4–10.5)
CHLORIDE: 107 meq/L (ref 96–112)
CO2: 21 mEq/L (ref 19–32)
Creatinine, Ser: 2.18 mg/dL — ABNORMAL HIGH (ref 0.50–1.10)
GFR calc non Af Amer: 22 mL/min — ABNORMAL LOW (ref >90)
GFR, EST AFRICAN AMERICAN: 25 mL/min — AB (ref >90)
Glucose, Bld: 75 mg/dL (ref 70–99)
Potassium: 4.2 mEq/L (ref 3.7–5.3)
Sodium: 141 mEq/L (ref 137–147)

## 2013-02-07 LAB — GLUCOSE, CAPILLARY
GLUCOSE-CAPILLARY: 107 mg/dL — AB (ref 70–99)
GLUCOSE-CAPILLARY: 145 mg/dL — AB (ref 70–99)
Glucose-Capillary: 57 mg/dL — ABNORMAL LOW (ref 70–99)
Glucose-Capillary: 60 mg/dL — ABNORMAL LOW (ref 70–99)
Glucose-Capillary: 147 mg/dL — ABNORMAL HIGH (ref 70–99)

## 2013-02-07 MED ORDER — LOSARTAN POTASSIUM 50 MG PO TABS: 50.0000 mg | ORAL_TABLET | Freq: Every day | ORAL | Status: DC

## 2013-02-07 NOTE — Discharge Summary (Signed)
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     albuterol (2.5 MG/3ML) 0.083% nebulizer solution  Commonly known as:  PROVENTIL  Take 3 mLs (2.5 mg total) by nebulization 4 (four) times daily.     amLODipine 5 MG tablet  Commonly known as:  NORVASC  Take 10 mg by mouth daily.     augmented betamethasone dipropionate 0.05 % cream  Commonly known as:  DIPROLENE-AF  Apply 1 application topically 2 (two) times daily. To both legs     budesonide 0.25 MG/2ML nebulizer solution  Commonly known as:  PULMICORT  Take 2 mLs (0.25 mg total) by nebulization 2 (two) times daily.     CALTRATE 600+D 600-400 MG-UNIT per tablet  Generic drug:  Calcium Carbonate-Vitamin D  Take 1 tablet by mouth daily.     esomeprazole 40 MG capsule  Commonly known as:  NEXIUM  Take 40 mg by mouth daily before breakfast.     ferrous sulfate 325 (65 FE) MG tablet  Take 325 mg by mouth daily with breakfast.     Fish Oil 1000 MG Caps  Take 1 capsule by mouth daily.      fluticasone 50 MCG/ACT nasal spray  Commonly known as:  FLONASE  Place 2 sprays into the nose daily.     folic acid A999333 MCG tablet  Commonly known as:  FOLVITE  Take 400 mcg by mouth daily.     furosemide 40 MG tablet  Commonly known as:  LASIX  Take 80 mg by mouth daily.     glipiZIDE 10 MG 24 hr tablet  Commonly known as:  GLUCOTROL XL  Take 10 mg by mouth daily.     HUMALOG MIX 75/25 KWIKPEN (75-25) 100 UNIT/ML Kwikpen  Generic drug:  Insulin Lispro Prot & Lispro  Inject 15 Units into the skin every morning.     insulin glargine 100 UNIT/ML injection  Commonly known as:  LANTUS  Inject 35 Units into the skin daily.     losartan 50 MG tablet  Commonly known as:  COZAAR  Take 1 tablet (50 mg total) by mouth daily.     Magnesium 250 MG Tabs  Take 1 tablet by mouth daily.     montelukast 10 MG tablet  Commonly known as:  SINGULAIR  Take 1 tablet (10 mg total) by mouth at bedtime.     polyethylene glycol packet  Commonly known as:  MIRALAX / GLYCOLAX  Take 17  g by mouth daily.     potassium chloride SA 20 MEQ tablet  Commonly known as:  K-DUR,KLOR-CON  Take 20 mEq by mouth daily.     prochlorperazine 5 MG tablet  Commonly known as:  COMPAZINE  Take 5 mg by mouth every 6 (six) hours as needed for nausea.     pyridOXINE 100 MG tablet  Commonly known as:  VITAMIN B-6  Take 100 mg by mouth daily.     simvastatin 20 MG tablet  Commonly known as:  ZOCOR  Take 20 mg by mouth every evening.     SYSTANE BALANCE OP  Apply 1 drop to eye 2 (two) times daily.     traZODone 50 MG tablet  Commonly known as:  DESYREL  Take 1 tablet (50 mg total) by mouth at bedtime as needed for sleep.     vitamin B-12 1000 MCG tablet  Commonly known as:  CYANOCOBALAMIN  Take 1,000 mcg by mouth daily.     vitamin E 400 UNIT capsule  Take 400 Units by mouth daily.        Hospital Procedures: No results found.  History of Present Illness:  [name] is a 73 year old with  multiple chronic medical problems well-known to me to include hypertension, diabetes mellitus to with renal and neurologic complications, anemia of chronic disease, chronic asthma presenting at this time with at least 2 days of bloody stools. She had a similar episode back in August and was hospitalized resulting in a colonoscopy which revealed one polyp and diverticulosis. She's had no difficulty since that time but began noticing some bright red blood per Friday. Frequency has intensified as of today she's had multiple bloody stools and several including clot formation. This is now accompanied by weakness and lightheadedness she presented to the emergency room for more evaluation. In the emergency room she had another large bloody stool with clot formation. She has had a drop in hemoglobin and she is admitted for further management she's having no chest pain nausea or vomiting. [place] he swelling is at baseline. She is having lightheadedness and near syncopal type symptoms but no frank syncope.     Hospital Course: Patient was omitted to the medical service placed at bowel rest initially as she had frequent bloody stools passing clot. She had a similar hospitalization an extensive GI workup leading to a diagnosis of diverticular bleed back in August but has done well since that time. Other medical comorbidities including her pulmonary disease and her diabetes has been relatively stable. She had about 2 days of symptoms leading up to this hospitalization. She is weak with ongoing GI bleeding and hemoglobin in the 7 range therefore transfuse 2 units packed red blood cells with stabilization of her hemoglobin in the 9 range. Hemoglobin remained stable after this as did she hemodynamically infection is a bit hypertensive requiring the addition of losartan. She remained stable on her inhalers from a pulmonary standpoint. Ultimately she had approximately 48 hours of no bleeding and diet was advanced. Upon  discharge remained stable but with a guarded prognosis as she is quite aware that this can be a recurrent possibility but ultimately require surgical intervention.  Day of Discharge Exam BP 139/71  Pulse 61  Temp(Src) 98.3 F (36.8 C) (Oral)  Resp 20  Ht 5\' 5"  (1.651 m)  Wt 104.826 kg (231 lb 1.6 oz)  BMI 38.46 kg/m2  SpO2 98%  Physical Exam: General appearance: alert, cooperative and no distress Eyes: no scleral icterus  Throat: oropharynx moist without erythema Resp: clear to auscultation bilaterally Cardio: regular rate and rhythm Extremities: no clubbing, cyanosis , chronic trace to 1+ edema with Charcot feet and venous stasis changes Neurologically she is nonlateralizing does have a distal neuropathy sensory Abdomen is soft nontender good bowel sounds  Discharge Labs:  Recent Labs  02/05/13 0706 02/06/13 0905  NA 141 142  K 4.4 4.7  CL 105 107  CO2 20 21  GLUCOSE 158* 189*  BUN 29* 37*  CREATININE 1.51* 1.72*  CALCIUM 8.8 8.0*   No results found for this basename: AST, ALT, ALKPHOS, BILITOT, PROT, ALBUMIN,  in the last 72 hours  Recent Labs  02/05/13 0815 02/06/13 0905  WBC  --  10.2  HGB 9.7* 9.1*  HCT 27.4* 25.8*  MCV  --  84.9  PLT  --  189   No results found for this basename: CKTOTAL, CKMB, CKMBINDEX, TROPONINI,  in the last 72 hours No results found for this basename: TSH, T4TOTAL, FREET3, T3FREE, THYROIDAB,  in the last 72 hours No results found for this basename: VITAMINB12, FOLATE, FERRITIN, TIBC, IRON, RETICCTPCT,  in the last 72 hours  Discharge instructions:     Discharge Orders   Future Orders Complete By Expires   Diet - low sodium heart healthy  As directed    Increase activity slowly  As directed       Disposition: Home  Follow-up Appts: Follow-up with Dr. Reynaldo Minium at [facility] in next week.  Call for appointment.  Condition on Discharge: Stable and improved  Tests Needing Follow-up: Lab work next week in the  office  Signed: Jameer Storie A 02/07/2013, 7:46 AM

## 2013-02-07 NOTE — Progress Notes (Signed)
Patient is given discharged instructions. All questions were answered. Information in patient's purse on how to get in contact with advance home care. Patient's IV was removed and pt to leave with family.

## 2013-02-07 NOTE — Progress Notes (Signed)
Occupational Therapy Treatment Patient Details Name: [name] MRN: [id number] DOB: 1940/07/11 Today's Date: 02/07/2013 Time: [id number] OT Time Calculation (min): 30 min  OT Assessment / Plan / Recommendation  History of present illness Pt presented with bloody stools.  She has a h/o HTN, DM,chronic asthma.     OT comments  Pt with improved endurance and mobility for ADL.  Instructed in energy conservation.  Pt is hopeful for d/c later today.  Will have intermittent assist of family and friends.  Follow Up Recommendations  Home health OT;Supervision - Intermittent    Barriers to Discharge       Equipment Recommendations  None recommended by OT    Recommendations for Other Services    Frequency Min 2X/week   Progress towards OT Goals Progress towards OT goals: Progressing toward goals  Plan Discharge plan remains appropriate    Precautions / Restrictions Precautions Precautions: Fall Precaution Comments: on O2 PRN at home and at night Restrictions Weight Bearing Restrictions: No   Pertinent Vitals/Pain VSS, no pain    ADL  Grooming: Wash/dry hands;Supervision/safety Where Assessed - Grooming: Unsupported standing Toilet Transfer: Copy Method: Sit to Loss adjuster, chartered: Therapist, occupational and Hygiene: Supervision/safety Where Assessed - Best boy and Hygiene: Sit to stand from 3-in-1 or toilet Equipment Used:  (rollator) Transfers/Ambulation Related to ADLs: used rollator with supervision ADL Comments: Showed pt a picture of the stock aide from the internet and instructed in where she may purchase in the community.    OT Diagnosis:    OT Problem List:   OT Treatment Interventions:     OT Goals(current goals can now be found in the care plan section) Acute Rehab OT Goals Patient Stated Goal: to do as much as I can for myself  Visit Information  Last OT Received On:  02/07/13 Assistance Needed: +1 History of Present Illness: Pt presented with bloody stools.  She has a h/o HTN, DM,chronic asthma.      Subjective Data      Prior Functioning       Cognition  Cognition Arousal/Alertness: Awake/alert Behavior During Therapy: WFL for tasks assessed/performed Overall Cognitive Status: Within Functional Limits for tasks assessed    Mobility  Bed Mobility Overal bed mobility: Modified Independent General bed mobility comments: sit to supine, supine to sit Transfers Equipment used: 4-wheeled walker Transfers: Sit to/from Stand Sit to Stand: Supervision    Exercises      Balance Balance Sitting balance-Leahy Scale: Good Standing balance-Leahy Scale: Fair  End of Session OT - End of Session Activity Tolerance: Patient tolerated treatment well Patient left: in bed;with call bell/phone within reach  GO     Malka So 02/07/2013, 2:55 PM [phone]

## 2013-03-05 ENCOUNTER — Other Ambulatory Visit (HOSPITAL_COMMUNITY): Payer: Self-pay

## 2013-03-06 ENCOUNTER — Emergency Department (HOSPITAL_COMMUNITY)
Admission: EM | Admit: 2013-03-06 | Discharge: 2013-03-06 | Disposition: A | Discharge status: Home / Self Care | Payer: Medicare Other | Attending: Emergency Medicine | Admitting: Emergency Medicine

## 2013-03-06 ENCOUNTER — Ambulatory Visit (HOSPITAL_COMMUNITY)
Admission: RE | Admit: 2013-03-06 | Discharge: 2013-03-06 | Disposition: A | Discharge status: Home / Self Care | Payer: Medicare Other | Source: Ambulatory Visit | Attending: Internal Medicine | Admitting: Internal Medicine

## 2013-03-06 ENCOUNTER — Emergency Department (HOSPITAL_COMMUNITY): Payer: Medicare Other

## 2013-03-06 ENCOUNTER — Other Ambulatory Visit: Payer: Self-pay

## 2013-03-06 ENCOUNTER — Encounter (HOSPITAL_COMMUNITY): Payer: Self-pay | Admitting: Emergency Medicine

## 2013-03-06 DIAGNOSIS — IMO0002 Reserved for concepts with insufficient information to code with codable children: Secondary | ICD-10-CM | POA: Insufficient documentation

## 2013-03-06 DIAGNOSIS — Z8711 Personal history of peptic ulcer disease: Secondary | ICD-10-CM | POA: Insufficient documentation

## 2013-03-06 DIAGNOSIS — R0989 Other specified symptoms and signs involving the circulatory and respiratory systems: Principal | ICD-10-CM | POA: Insufficient documentation

## 2013-03-06 DIAGNOSIS — Z87448 Personal history of other diseases of urinary system: Secondary | ICD-10-CM | POA: Insufficient documentation

## 2013-03-06 DIAGNOSIS — Z860100 Personal history of colon polyps, unspecified: Secondary | ICD-10-CM | POA: Insufficient documentation

## 2013-03-06 DIAGNOSIS — R0609 Other forms of dyspnea: Secondary | ICD-10-CM | POA: Insufficient documentation

## 2013-03-06 DIAGNOSIS — Z87891 Personal history of nicotine dependence: Secondary | ICD-10-CM | POA: Insufficient documentation

## 2013-03-06 DIAGNOSIS — E7800 Pure hypercholesterolemia, unspecified: Secondary | ICD-10-CM | POA: Insufficient documentation

## 2013-03-06 DIAGNOSIS — M129 Arthropathy, unspecified: Secondary | ICD-10-CM | POA: Insufficient documentation

## 2013-03-06 DIAGNOSIS — F3289 Other specified depressive episodes: Secondary | ICD-10-CM | POA: Insufficient documentation

## 2013-03-06 DIAGNOSIS — I10 Essential (primary) hypertension: Secondary | ICD-10-CM | POA: Insufficient documentation

## 2013-03-06 DIAGNOSIS — F329 Major depressive disorder, single episode, unspecified: Secondary | ICD-10-CM | POA: Insufficient documentation

## 2013-03-06 DIAGNOSIS — Z794 Long term (current) use of insulin: Secondary | ICD-10-CM | POA: Insufficient documentation

## 2013-03-06 DIAGNOSIS — K219 Gastro-esophageal reflux disease without esophagitis: Secondary | ICD-10-CM | POA: Insufficient documentation

## 2013-03-06 DIAGNOSIS — J45901 Unspecified asthma with (acute) exacerbation: Secondary | ICD-10-CM

## 2013-03-06 DIAGNOSIS — Z7982 Long term (current) use of aspirin: Secondary | ICD-10-CM | POA: Insufficient documentation

## 2013-03-06 DIAGNOSIS — D649 Anemia, unspecified: Secondary | ICD-10-CM | POA: Insufficient documentation

## 2013-03-06 DIAGNOSIS — E119 Type 2 diabetes mellitus without complications: Secondary | ICD-10-CM | POA: Insufficient documentation

## 2013-03-06 DIAGNOSIS — J441 Chronic obstructive pulmonary disease with (acute) exacerbation: Secondary | ICD-10-CM | POA: Insufficient documentation

## 2013-03-06 DIAGNOSIS — R0600 Dyspnea, unspecified: Secondary | ICD-10-CM

## 2013-03-06 LAB — CBC WITH DIFFERENTIAL/PLATELET
BASOS ABS: 0 10*3/uL (ref 0.0–0.1)
Basophils Relative: 0 % (ref 0–1)
Eosinophils Absolute: 0.4 10*3/uL (ref 0.0–0.7)
Eosinophils Relative: 5 % (ref 0–5)
HCT: 26.9 % — ABNORMAL LOW (ref 36.0–46.0)
Hemoglobin: 9.2 g/dL — ABNORMAL LOW (ref 12.0–15.0)
LYMPHS PCT: 18 % (ref 12–46)
Lymphs Abs: 1.5 10*3/uL (ref 0.7–4.0)
MCH: 29.7 pg (ref 26.0–34.0)
MCHC: 34.2 g/dL (ref 30.0–36.0)
MCV: 86.8 fL (ref 78.0–100.0)
Monocytes Absolute: 0.4 10*3/uL (ref 0.1–1.0)
Monocytes Relative: 5 % (ref 3–12)
Neutro Abs: 6.2 10*3/uL (ref 1.7–7.7)
Neutrophils Relative %: 72 % (ref 43–77)
PLATELETS: 222 10*3/uL (ref 150–400)
RBC: 3.1 MIL/uL — ABNORMAL LOW (ref 3.87–5.11)
RDW: 15 % (ref 11.5–15.5)
WBC: 8.6 10*3/uL (ref 4.0–10.5)

## 2013-03-06 LAB — BASIC METABOLIC PANEL
BUN: 58 mg/dL — ABNORMAL HIGH (ref 6–23)
CHLORIDE: 107 meq/L (ref 96–112)
CO2: 22 meq/L (ref 19–32)
Calcium: 8.7 mg/dL (ref 8.4–10.5)
Creatinine, Ser: 1.89 mg/dL — ABNORMAL HIGH (ref 0.50–1.10)
GFR calc Af Amer: 30 mL/min — ABNORMAL LOW (ref >90)
GFR calc non Af Amer: 26 mL/min — ABNORMAL LOW (ref >90)
Glucose, Bld: 200 mg/dL — ABNORMAL HIGH (ref 70–99)
POTASSIUM: 4.6 meq/L (ref 3.7–5.3)
SODIUM: 143 meq/L (ref 137–147)

## 2013-03-06 LAB — PRO B NATRIURETIC PEPTIDE: PRO B NATRI PEPTIDE: 242.7 pg/mL — AB (ref 0–125)

## 2013-03-06 LAB — PREPARE RBC (CROSSMATCH)

## 2013-03-06 MED ORDER — IPRATROPIUM BROMIDE 0.02 % IN SOLN
0.5000 mg | Freq: Once | RESPIRATORY_TRACT | Status: AC
  Administered 2013-03-06: 0.5 mg via RESPIRATORY_TRACT
  Filled 2013-03-06: qty 2.5

## 2013-03-06 MED ORDER — FUROSEMIDE 20 MG PO TABS: 60.0000 mg | ORAL_TABLET | Freq: Two times a day (BID) | ORAL | Status: DC

## 2013-03-06 MED ORDER — ALBUTEROL SULFATE (2.5 MG/3ML) 0.083% IN NEBU
5.0000 mg | INHALATION_SOLUTION | Freq: Once | RESPIRATORY_TRACT | Status: AC
  Administered 2013-03-06: 5 mg via RESPIRATORY_TRACT
  Filled 2013-03-06: qty 6

## 2013-03-06 MED ORDER — FUROSEMIDE 10 MG/ML IJ SOLN
40.0000 mg | Freq: Once | INTRAMUSCULAR | Status: AC
  Administered 2013-03-06: 40 mg via INTRAVENOUS
  Filled 2013-03-06 (×2): qty 4

## 2013-03-06 MED ORDER — FUROSEMIDE 40 MG PO TABS
40.0000 mg | ORAL_TABLET | Freq: Once | ORAL | Status: AC
  Administered 2013-03-06: 40 mg via ORAL
  Filled 2013-03-06: qty 1

## 2013-03-06 MED ORDER — ALBUTEROL SULFATE (2.5 MG/3ML) 0.083% IN NEBU
INHALATION_SOLUTION | RESPIRATORY_TRACT | Status: AC
  Filled 2013-03-06: qty 3

## 2013-03-06 MED ORDER — ALBUTEROL SULFATE (2.5 MG/3ML) 0.083% IN NEBU
2.5000 mg | INHALATION_SOLUTION | Freq: Once | RESPIRATORY_TRACT | Status: AC
  Administered 2013-03-06: 2.5 mg via RESPIRATORY_TRACT

## 2013-03-06 MED ORDER — AMLODIPINE BESYLATE 5 MG PO TABS
5.0000 mg | ORAL_TABLET | Freq: Once | ORAL | Status: AC
  Administered 2013-03-06: 5 mg via ORAL
  Filled 2013-03-06: qty 1

## 2013-03-06 MED ORDER — FUROSEMIDE 10 MG/ML IJ SOLN
20.0000 mg | Freq: Once | INTRAMUSCULAR | Status: DC
  Filled 2013-03-06: qty 2

## 2013-03-06 MED ORDER — LOSARTAN POTASSIUM 50 MG PO TABS
50.0000 mg | ORAL_TABLET | Freq: Once | ORAL | Status: AC
  Administered 2013-03-06: 50 mg via ORAL
  Filled 2013-03-06: qty 1

## 2013-03-06 NOTE — ED Notes (Signed)
Pt from short stay.  Pt here for blood transfusion due to low hemoglobin.  Pt was to receive 2 units PRBCs.  During transfusion pt started to become short of breath and developed wheezing.  Pt with labored breathing on arrival.  97% on RA.  No other complaints or reaction from blood transfusion.  Transfusion stopped on arrival.

## 2013-03-06 NOTE — ED Provider Notes (Signed)
CSN: [id number]     Arrival date & time 03/06/13  1517 History   First MD Initiated Contact with Patient 03/06/13 1533     Chief Complaint  Patient presents with  . Shortness of Breath     HPI  Patient presents with dyspnea. Patient was at short stay receiving blood transfusion, for recurrent anemia when she developed dyspnea.  There was no concurrent chest pain, headache, syncope, near syncope, fever, nausea or Patient has no history of similar events. She states that she was in her usual state of health until this event. She does have a history of chronic GI bleed/anemia for which she has received transfusions multiple times previously. Since the event the patient has received Lasix, pretreatment with some improvement in her condition.  Past Medical History  Diagnosis Date  . Arthritis   . Lumbar disc disease   . Asthma   . Depression   . Diabetes mellitus   . GERD (gastroesophageal reflux disease)   . Hypercholesterolemia   . HTN (hypertension)   . Peptic ulcer disease   . Valgus foot     right   . Gastric ulcer   . Adenomatous colon polyp   . Diverticulosis   . Internal hemorrhoids   . 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 Anemia   . Umbilical hernia   . 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 OSA (obstructive sleep apnea) 09/17/2012   Past Surgical History  Procedure Laterality Date  . Total knee arthroplasty      Bilateral  . Incise and drain abcess      Abdominal Wall  . Umbilical hernia repair  2007    incarcerated  . Lipoma resection      Right Neck  . Colonoscopy  2209 or 2010     2001, 2005, 2009  colonoscopy.  [place]  . Upper gastrointestinal endoscopy  2001    medoff Grade A esophagitis  . Colonoscopy N/A 08/14/2012    Procedure: COLONOSCOPY;  Surgeon: Ladene Artist, MD;  Location[facility] ENDOSCOPY;  Service: Endoscopy;  Laterality: N/A;   Family History  Problem Relation Age of Onset  . Breast cancer Maternal Aunt    History  Substance Use  Topics  . Smoking status: Former Smoker -- 2.00 packs/day for 5 years    Types: Cigarettes    Quit date: 01/10/1958  . Smokeless tobacco: Never Used  . Alcohol Use: No     Comment: Quit when she quit smoking   OB History   Grav Para Term Preterm Abortions TAB SAB Ect Mult Living                 Review of Systems  Constitutional:       Per HPI, otherwise negative  HENT:       Per HPI, otherwise negative  Respiratory:       Per HPI, otherwise negative  Cardiovascular:       Per HPI, otherwise negative  Gastrointestinal: Negative for vomiting.  Endocrine:       Negative aside from HPI  Genitourinary:       Neg aside from HPI   Musculoskeletal:       Per HPI, otherwise negative  Skin: Negative.   Neurological: Negative for syncope.      Allergies  Other  Home Medications   Current Outpatient Rx  Name  Route  Sig  Dispense  Refill  . albuterol (PROAIR HFA) 108 (90 BASE) MCG/ACT inhaler  Inhalation   Inhale 2 puffs into the lungs every 6 (six) hours as needed for wheezing.         Marland Kitchen albuterol (PROVENTIL) (2.5 MG/3ML) 0.083% nebulizer solution   Nebulization   Take 3 mLs (2.5 mg total) by nebulization 4 (four) times daily.   75 mL   12   . amLODipine (NORVASC) 5 MG tablet   Oral   Take 10 mg by mouth daily.          Marland Kitchen ascorbic acid (VITAMIN C) 1000 MG tablet   Oral   Take 1,000 mg by mouth daily.         Marland Kitchen aspirin 81 MG tablet   Oral   Take 81 mg by mouth daily.         Marland Kitchen augmented betamethasone dipropionate (DIPROLENE-AF) 0.05 % cream   Topical   Apply 1 application topically 2 (two) times daily. To both legs         . budesonide (PULMICORT) 0.25 MG/2ML nebulizer solution   Nebulization   Take 2 mLs (0.25 mg total) by nebulization 2 (two) times daily.   60 mL   12   . Calcium Carbonate-Vitamin D (CALTRATE 600+D) 600-400 MG-UNIT per tablet   Oral   Take 1 tablet by mouth daily.         . fluticasone (FLONASE) 50 MCG/ACT nasal spray    Nasal   Place 2 sprays into the nose daily.          . folic acid (FOLVITE) A999333 MCG tablet   Oral   Take 400 mcg by mouth daily.         . furosemide (LASIX) 40 MG tablet   Oral   Take 80 mg by mouth daily.          Marland Kitchen glipiZIDE (GLUCOTROL XL) 10 MG 24 hr tablet   Oral   Take 10 mg by mouth daily.          Marland Kitchen HUMALOG MIX 75/25 KWIKPEN (75-25) 100 UNIT/ML SUSP   Subcutaneous   Inject 15 Units into the skin every morning.         . insulin glargine (LANTUS) 100 UNIT/ML injection   Subcutaneous   Inject 35 Units into the skin at bedtime.          . iron polysaccharides (NIFEREX) 150 MG capsule   Oral   Take 150 mg by mouth 2 (two) times daily.         Marland Kitchen losartan (COZAAR) 50 MG tablet   Oral   Take 1 tablet (50 mg total) by mouth daily.   30 tablet   11   . magnesium oxide (MAG-OX) 400 MG tablet   Oral   Take 400 mg by mouth daily.         . montelukast (SINGULAIR) 10 MG tablet   Oral   Take 1 tablet (10 mg total) by mouth at bedtime.   30 tablet   5   . Omega-3 Fatty Acids (FISH OIL) 1000 MG CAPS   Oral   Take 1 capsule by mouth daily.         . potassium chloride SA (K-DUR,KLOR-CON) 20 MEQ tablet   Oral   Take 20 mEq by mouth daily.         . prochlorperazine (COMPAZINE) 5 MG tablet   Oral   Take 5 mg by mouth every 6 (six) hours as needed for nausea.         Marland Kitchen  Propylene Glycol (SYSTANE BALANCE) 0.6 % SOLN   Ophthalmic   Apply 1 drop to eye 2 (two) times daily.         Marland Kitchen pyridOXINE (VITAMIN B-6) 100 MG tablet   Oral   Take 100 mg by mouth daily.         . traMADol (ULTRAM) 50 MG tablet   Oral   Take 50 mg by mouth every 6 (six) hours as needed for severe pain.         . vitamin B-12 (CYANOCOBALAMIN) 1000 MCG tablet   Oral   Take 1,000 mcg by mouth daily.         . vitamin E 400 UNIT capsule   Oral   Take 400 Units by mouth daily.          BP 131/74  Pulse 80  Temp(Src) 98.7 F (37.1 C)  Resp 19  SpO2  97% Physical Exam  Nursing note and vitals reviewed. Constitutional: She is oriented to person, place, and time. She appears well-developed and well-nourished. No distress.  HENT:  Head: Normocephalic and atraumatic.  Eyes: Conjunctivae and EOM are normal.  Cardiovascular: Normal rate and regular rhythm.   Pulmonary/Chest: No accessory muscle usage or stridor. Tachypnea noted. She has decreased breath sounds. She has wheezes.  Abdominal: Normal appearance. She exhibits no distension. There is no tenderness.  Musculoskeletal: She exhibits no edema.  Neurological: She is alert and oriented to person, place, and time. No cranial nerve deficit.  Skin: Skin is warm and dry.  Psychiatric: She has a normal mood and affect.    ED Course  Procedures (including critical care time) Labs Review Labs Reviewed  CBC WITH DIFFERENTIAL - Abnormal; Notable for the following:    RBC 3.10 (*)    Hemoglobin 9.2 (*)    HCT 26.9 (*)    All other components within normal limits  BASIC METABOLIC PANEL - Abnormal; Notable for the following:    Glucose, Bld 200 (*)    BUN 58 (*)    Creatinine, Ser 1.89 (*)    GFR calc non Af Amer 26 (*)    GFR calc Af Amer 30 (*)    All other components within normal limits  PRO B NATRIURETIC PEPTIDE - Abnormal; Notable for the following:    Pro B Natriuretic peptide (BNP) 242.7 (*)    All other components within normal limits   Imaging Review Dg Chest Portable 1 View  03/06/2013   CLINICAL DATA:  Short of breath  EXAM: PORTABLE CHEST - 1 VIEW  COMPARISON:  12/16/2011  FINDINGS: Low volumes. Diffuse predominately interstitial edema. Upper normal heart size. No pneumothorax.  IMPRESSION: Diffuse predominately interstitial edema.   Electronically Signed   By: Maryclare Bean M.D.   On: 03/06/2013 15:53    EKG Interpretation    Date/Time:  Wednesday March 06 2013 15:25:52 EST Ventricular Rate:  72 PR Interval:  173 QRS Duration: 84 QT Interval:  389 QTC  Calculation: 426 R Axis:   54 Text Interpretation:  Sinus or ectopic atrial rhythm Borderline repolarization abnormality Minimal ST elevation, lateral leads Sinus rhythm Artifact T wave abnormality Abnormal ekg Confirmed by Carmin Muskrat  MD ([id number]) on 03/06/2013 5:07:29 PM            6:29 PM Lung sounds significantly better.  Repeat XR ordered.  8:45 PM Patient some substantially better.  Oxygenation is 100%.  Patient requests discharge. Labs reviewed with the patient.  Hemoglobin is increased from recent  value.   MDM   This patient presents after the dyspnea while receiving a blood transfusion.  Notably, the patient had no rash, no fever, no altered mental status, no abdominal pain, no nausea or vomiting.  This aspect are all reassuring for the low suspicion of transfusion-related acute lung injury. There is greater suspicion for volume related dyspnea.  The patient's improvement following diuresis, monitoring is reassuring for this. The patient's ED course was unremarkable, she was hungry, remained awake and alert throughout, and after a long discussion return precautions all instructions, she was discharged, per her request.   Carmin Muskrat, MD 03/06/13 2046

## 2013-03-06 NOTE — Discharge Instructions (Signed)
As discussed, it is important that you follow up as soon as possible with your physician for continued management of your condition. ° °If you develop any new, or concerning changes in your condition, please return to the emergency department immediately. ° °

## 2013-03-06 NOTE — ED Notes (Signed)
ED EKG given to Dr. Vanita Panda.

## 2013-03-06 NOTE — ED Notes (Signed)
Pt with hx of GI bleed.  Sts when she had a bm during transfusion she has not noticed any blood in her stool.  Sts is has been a couple of weeks since she has noticed any blood.

## 2013-03-06 NOTE — Progress Notes (Addendum)
1240 patient complaining of shortness of breath.  Wheezing ausultated throughout lungs. O2 sat 97%.  Dr Reynaldo Minium advised.  Orders received [id number] Respiratory therapist in.  Administered albuterol nebulizer treatment.  Breathing better.  Scattered wheezes now noted. Placed on o2 at 2 liters per nasal canula. 1401 Continues to have expiratory wheezes bilaterally.  02 sat 100% on O2 at 2 liters per nasal canula 1420 Spoke with Danae Chen, Dr Jacquiline Doe assistant and expressed concerns about patient living alone and going home tonight since she is not currently at her baseline.  She cannot ambulate to the bathroom without getting short of breath. [place] Dr Reynaldo Minium will not be able to evaluate patient this afternoon. He states if the patient and I feel that she needs evaluation that she will need to go to ED.   1510 Transferred via stretcher to ED for evaluation without incident

## 2013-03-07 LAB — TYPE AND SCREEN
ABO/RH(D): B POS
Antibody Screen: NEGATIVE
Unit division: 0
Unit division: 0

## 2013-03-11 ENCOUNTER — Other Ambulatory Visit: Payer: Self-pay

## 2013-03-11 DIAGNOSIS — Z1231 Encounter for screening mammogram for malignant neoplasm of breast: Secondary | ICD-10-CM

## 2013-04-18 ENCOUNTER — Ambulatory Visit
Admission: RE | Admit: 2013-04-18 | Discharge: 2013-04-18 | Disposition: A | Discharge status: Home / Self Care | Payer: Medicare Other | Source: Ambulatory Visit

## 2013-04-18 DIAGNOSIS — Z1231 Encounter for screening mammogram for malignant neoplasm of breast: Secondary | ICD-10-CM

## 2013-04-19 ENCOUNTER — Other Ambulatory Visit: Payer: Self-pay | Admitting: Internal Medicine

## 2013-04-19 DIAGNOSIS — R928 Other abnormal and inconclusive findings on diagnostic imaging of breast: Secondary | ICD-10-CM

## 2013-05-01 ENCOUNTER — Ambulatory Visit
Admission: RE | Admit: 2013-05-01 | Discharge: 2013-05-01 | Disposition: A | Discharge status: Home / Self Care | Payer: Medicare Other | Source: Ambulatory Visit | Attending: Internal Medicine | Admitting: Internal Medicine

## 2013-05-01 ENCOUNTER — Encounter (INDEPENDENT_AMBULATORY_CARE_PROVIDER_SITE_OTHER): Payer: Self-pay

## 2013-05-01 DIAGNOSIS — R928 Other abnormal and inconclusive findings on diagnostic imaging of breast: Secondary | ICD-10-CM

## 2013-07-24 ENCOUNTER — Other Ambulatory Visit: Payer: Self-pay | Admitting: Pulmonary Disease

## 2013-09-11 ENCOUNTER — Other Ambulatory Visit (HOSPITAL_COMMUNITY): Payer: Self-pay | Admitting: Internal Medicine

## 2013-09-11 DIAGNOSIS — I358 Other nonrheumatic aortic valve disorders: Secondary | ICD-10-CM

## 2013-09-12 ENCOUNTER — Ambulatory Visit (HOSPITAL_COMMUNITY): Payer: Medicare Other

## 2013-10-03 ENCOUNTER — Other Ambulatory Visit (HOSPITAL_COMMUNITY): Payer: Self-pay | Admitting: Interventional Radiology

## 2013-10-03 ENCOUNTER — Other Ambulatory Visit: Payer: Self-pay | Admitting: Radiology

## 2013-10-03 DIAGNOSIS — C641 Malignant neoplasm of right kidney, except renal pelvis: Secondary | ICD-10-CM

## 2013-10-10 ENCOUNTER — Inpatient Hospital Stay (HOSPITAL_COMMUNITY): Admission: RE | Admit: 2013-10-10 | Payer: Medicare Other | Source: Ambulatory Visit

## 2013-10-10 ENCOUNTER — Other Ambulatory Visit (HOSPITAL_COMMUNITY): Payer: Self-pay | Admitting: Internal Medicine

## 2013-10-10 DIAGNOSIS — R011 Cardiac murmur, unspecified: Secondary | ICD-10-CM

## 2013-10-16 ENCOUNTER — Ambulatory Visit (HOSPITAL_COMMUNITY)
Admission: RE | Admit: 2013-10-16 | Discharge: 2013-10-16 | Disposition: A | Discharge status: Home / Self Care | Payer: Medicare Other | Source: Ambulatory Visit | Attending: Cardiovascular Disease | Admitting: Cardiovascular Disease

## 2013-10-16 DIAGNOSIS — I359 Nonrheumatic aortic valve disorder, unspecified: Secondary | ICD-10-CM

## 2013-10-16 DIAGNOSIS — R011 Cardiac murmur, unspecified: Secondary | ICD-10-CM | POA: Insufficient documentation

## 2013-10-16 DIAGNOSIS — E119 Type 2 diabetes mellitus without complications: Secondary | ICD-10-CM | POA: Diagnosis not present

## 2013-10-16 NOTE — Progress Notes (Signed)
2D Echo Performed 10/16/2013    Marygrace Drought, RCS

## 2013-10-24 LAB — CREATININE WITH EST GFR
CREATININE: 1.62 mg/dL — AB (ref 0.50–1.10)
GFR, EST AFRICAN AMERICAN: 36 mL/min — AB
GFR, EST NON AFRICAN AMERICAN: 31 mL/min — AB

## 2013-10-24 LAB — BUN: BUN: 43 mg/dL — AB (ref 6–23)

## 2013-10-30 ENCOUNTER — Encounter (HOSPITAL_COMMUNITY): Payer: Self-pay

## 2013-10-30 ENCOUNTER — Ambulatory Visit (HOSPITAL_COMMUNITY)
Admission: RE | Admit: 2013-10-30 | Discharge: 2013-10-30 | Disposition: A | Discharge status: Home / Self Care | Payer: Medicare Other | Source: Ambulatory Visit | Attending: Interventional Radiology | Admitting: Interventional Radiology

## 2013-10-30 ENCOUNTER — Ambulatory Visit
Admission: RE | Admit: 2013-10-30 | Discharge: 2013-10-30 | Disposition: A | Discharge status: Home / Self Care | Payer: Medicare Other | Source: Ambulatory Visit | Attending: Interventional Radiology | Admitting: Interventional Radiology

## 2013-10-30 DIAGNOSIS — Z1289 Encounter for screening for malignant neoplasm of other sites: Secondary | ICD-10-CM | POA: Insufficient documentation

## 2013-10-30 DIAGNOSIS — C641 Malignant neoplasm of right kidney, except renal pelvis: Secondary | ICD-10-CM

## 2013-10-30 DIAGNOSIS — Z85528 Personal history of other malignant neoplasm of kidney: Secondary | ICD-10-CM | POA: Diagnosis not present

## 2013-10-30 MED ORDER — IOHEXOL 300 MG/ML  SOLN
60.0000 mL | Freq: Once | INTRAMUSCULAR | Status: AC | PRN
  Administered 2013-10-30: 60 mL via INTRAVENOUS

## 2013-10-30 NOTE — Progress Notes (Signed)
Chief Complaint: Chief Complaint  Patient presents with  . 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 female status post her case cryoablation of a right renal carcinoma on 07/17/2009. The patient denies any urinary symptoms. She has difficulty ambulating the to chronic back pain and lumbar disc disease.  Past Medical History  Diagnosis Date  . Arthritis   . Lumbar disc disease   . Asthma   . Depression   . Diabetes mellitus   . GERD (gastroesophageal reflux disease)   . Hypercholesterolemia   . HTN (hypertension)   . Peptic ulcer disease   . Valgus foot     right   . Gastric ulcer   . Adenomatous colon polyp   . Diverticulosis   . Internal hemorrhoids   . 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 Anemia   . Umbilical hernia   . 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 OSA (obstructive sleep apnea) 09/17/2012    Past Surgical History  Procedure Laterality Date  . Total knee arthroplasty      Bilateral  . Incise and drain abcess      Abdominal Wall  . Umbilical hernia repair  2007    incarcerated  . Lipoma resection      Right Neck  . Colonoscopy  2209 or 2010     2001, 2005, 2009  colonoscopy.  [place]  . Upper gastrointestinal endoscopy  2001    medoff Grade A esophagitis  . Colonoscopy N/A 08/14/2012    Procedure: COLONOSCOPY;  Surgeon: Ladene Artist, MD;  Location[facility] ENDOSCOPY;  Service: Endoscopy;  Laterality: N/A;    Allergies: Other  Medications: Prior to Admission medications   Medication Sig Start Date End Date Taking? Authorizing Provider  albuterol (PROVENTIL) (2.5 MG/3ML) 0.083% nebulizer solution Take 3 mLs (2.5 mg total) by nebulization 4 (four) times daily. 10/13/11  Yes Chesley Mires, MD  amLODipine (NORVASC) 5 MG tablet Take 10 mg by mouth daily.    Yes Historical Provider, MD  budesonide (PULMICORT) 0.25 MG/2ML nebulizer solution Take 2 mLs (0.25 mg total) by  nebulization 2 (two) times daily. 11/15/11  Yes Chesley Mires, MD  Calcium Carbonate-Vitamin D (CALTRATE 600+D) 600-400 MG-UNIT per tablet Take 1 tablet by mouth daily.   Yes Historical Provider, MD  folic acid (FOLVITE) A999333 MCG tablet Take 400 mcg by mouth daily.   Yes Historical Provider, MD  furosemide (LASIX) 20 MG tablet Take 3 tablets (60 mg total) by mouth 2 (two) times daily. Please take 60mg  twice daily for three days, then return to your typical dose 03/06/13  Yes Carmin Muskrat, MD  glipiZIDE (GLUCOTROL XL) 10 MG 24 hr tablet Take 10 mg by mouth daily.    Yes Historical Provider, MD  HUMALOG MIX 75/25 KWIKPEN (75-25) 100 UNIT/ML SUSP Inject 15 Units into the skin every morning. 09/01/11  Yes Historical Provider, MD  insulin glargine (LANTUS) 100 UNIT/ML injection Inject 35 Units into the skin at bedtime.    Yes Historical Provider, MD  iron polysaccharides (NIFEREX) 150 MG capsule Take 150 mg by mouth 2 (two) times daily.   Yes Historical Provider, MD  losartan (COZAAR) 50 MG tablet Take 1 tablet (50 mg total) by mouth daily. 02/07/13  Yes Geoffery Lyons, MD  magnesium oxide (MAG-OX) 400 MG tablet Take 400 mg by mouth daily.   Yes Historical Provider,  MD  Omega-3 Fatty Acids (FISH OIL) 1000 MG CAPS Take 1 capsule by mouth daily.   Yes Historical Provider, MD  prochlorperazine (COMPAZINE) 5 MG tablet Take 5 mg by mouth every 6 (six) hours as needed for nausea.   Yes Historical Provider, MD  Propylene Glycol (SYSTANE BALANCE) 0.6 % SOLN Apply 1 drop to eye 2 (two) times daily.   Yes Historical Provider, MD  pyridOXINE (VITAMIN B-6) 100 MG tablet Take 100 mg by mouth daily.   Yes Historical Provider, MD  SINGULAIR 10 MG tablet TAKE 1 TABLET BY MOUTH AT BEDTIME 07/24/13  Yes Chesley Mires, MD  traMADol (ULTRAM) 50 MG tablet Take 50 mg by mouth every 6 (six) hours as needed for severe pain.   Yes Historical Provider, MD  vitamin B-12 (CYANOCOBALAMIN) 1000 MCG tablet Take 1,000 mcg by mouth daily.    Yes Historical Provider, MD  vitamin E 400 UNIT capsule Take 400 Units by mouth daily.   Yes Historical Provider, MD  albuterol (PROAIR HFA) 108 (90 BASE) MCG/ACT inhaler Inhale 2 puffs into the lungs every 6 (six) hours as needed for wheezing. 04/20/11 07/23/13  Chesley Mires, MD  ascorbic acid (VITAMIN C) 1000 MG tablet Take 1,000 mg by mouth daily.    Historical Provider, MD  aspirin 81 MG tablet Take 81 mg by mouth daily.    Historical Provider, MD  augmented betamethasone dipropionate (DIPROLENE-AF) 0.05 % cream Apply 1 application topically 2 (two) times daily. To both legs    Historical Provider, MD  fluticasone (FLONASE) 50 MCG/ACT nasal spray Place 2 sprays into the nose daily.  03/14/12   Chesley Mires, MD  potassium chloride SA (K-DUR,KLOR-CON) 20 MEQ tablet Take 20 mEq by mouth daily.    Historical Provider, MD    Family History  Problem Relation Age of Onset  . Breast cancer Maternal Aunt     History   Social History  . Marital Status: Single    Spouse Name: N/A    Number of Children: 0  . Years of Education: N/A   Occupational History  . Retired     Astronomer   Social History Main Topics  . Smoking status: Former Smoker -- 2.00 packs/day for 5 years    Types: Cigarettes    Quit date: 01/10/1958  . Smokeless tobacco: Never Used  . Alcohol Use: No     Comment: Quit when she quit smoking  . Drug Use: No  . Sexual Activity: Not on file   Other Topics Concern  . Not on file   Social History Narrative   Retired from [employer] no children   Sister in [place] discussed and pertinent positives are indicated in the HPI above.  All other systems are negative.  Review of Systems  Constitutional: Negative.  Negative for fever and chills.  Respiratory: Negative.   Cardiovascular: Negative.   Gastrointestinal: Negative.  Negative for nausea, vomiting, abdominal pain and abdominal distention.  Genitourinary: Negative.   Negative for dysuria, urgency, frequency, hematuria, flank pain and pelvic pain.  Musculoskeletal: Positive for back pain.     Vital Signs: BP 157/64  Pulse 80  Temp(Src) 98.1 F (36.7 C) (Oral)  Resp 14  SpO2 95%  Physical Exam  Constitutional: She appears well-developed and well-nourished. No distress.  Pulmonary/Chest: No respiratory distress.  Abdominal: She exhibits no distension and no mass. There is no tenderness. There is no guarding.  Skin: Skin  is warm and dry. No rash noted. No erythema.    Imaging: CT was performed earlier today. This shows stable ablation defect of the posterior right kidney without evidence of abnormal enhancement after administration of contrast. No new renal lesions are identified.  Labs:  CBC:  Recent Labs  02/03/13 1716  02/05/13 0815 02/06/13 0905 02/07/13 0730 03/06/13 1532  WBC 7.3  --   --  10.2 11.9* 8.6  HGB 7.8*  < > 9.7* 9.1* 8.3* 9.2*  HCT 23.1*  < > 27.4* 25.8* 23.9* 26.9*  PLT 196  --   --  189 213 222  < > = values in this interval not displayed.  COAGS:  Recent Labs  02/03/13 1732  INR 1.19  APTT 38*    BMP:  Recent Labs  02/05/13 0706 02/06/13 0905 02/07/13 0730 03/06/13 1532 10/23/13 1705  NA 141 142 141 143  --   K 4.4 4.7 4.2 4.6  --   CL 105 107 107 107  --   CO2 20 21 21 22   --   GLUCOSE 158* 189* 75 200*  --   BUN 29* 37* 54* 58* 43*  CALCIUM 8.8 8.0* 7.5* 8.7  --   CREATININE 1.51* 1.72* 2.18* 1.89* 1.62*  GFRNONAA 34* 29* 22* 26* 31*  GFRAA 39* 33* 25* 30* 36*    LIVER FUNCTION TESTS:  Recent Labs  02/03/13 1716  BILITOT 0.4  AST 13  ALT 14  ALKPHOS 82  PROT 6.7  ALBUMIN 3.0*    TUMOR MARKERS: No results found for this basename: AFPTM, CEA, CA199, CHROMGRNA,  in the last 8760 hours  Assessment and Plan:  No evidence of recurrent right renal carcinoma 4 years after percutaneous cryoablation. Renal function is stable. Given the stable appearance of the ablation defect, I  recommended we cease routine post procedural imaging. Patient will notify me if she has any new abdominal pain, flank pain or urinary symptoms.  I spent a total of 15 minutes face to face in clinical consultation, greater than 50% of which was counseling/coordinating care for post right renal ablation.  Venetia Night. Kathlene Cote, M.D. Pager:  [phone]       Signed: Aletta Edouard T 10/30/2013, 3:30 PM

## 2013-11-05 ENCOUNTER — Telehealth: Payer: Self-pay | Admitting: Pulmonary Disease

## 2013-11-05 MED ORDER — CEFUROXIME AXETIL 500 MG PO TABS: 500.0000 mg | ORAL_TABLET | Freq: Two times a day (BID) | ORAL | Status: DC

## 2013-11-05 MED ORDER — PREDNISONE 10 MG PO TABS
ORAL_TABLET | ORAL | Status: DC
Start: 2013-11-05 — End: 2014-05-29

## 2013-11-05 NOTE — Telephone Encounter (Signed)
Pt aware of rec's per VS--aware to go to ED if not tolerating medications and continue to worsen. Rx's sent to [facility]  Nothing further needed.

## 2013-11-05 NOTE — Telephone Encounter (Signed)
Spoke with pt-- pt c/o increased SOB x 2 days, wheezing, cough and chest tightness.  Denies CP Using albuterol nebulizer too frequent per pt -- requests Rx be sent to [address] [place] [address] Pt also states that she has been using Pulmicort neb TID instead of BID as prescribed.  Has appt with VS 11/07/13--states that she cannot come in any sooner.   Pt is very SOB on phone, could not carry on a sentence without stopping to catch her breath, gasping and coughing.  Offered appt today but patient refused stating she does not have a ride to get here. Advised the patient that with the way she sounds and the fact that she is worsening she may need to go to the ED, Pt states that she does not feel like she is "bad enough" to go the ED. Pt would like rec's from VS.  Allergies  Allergen Reactions  . Other Other (See Comments)    "all generics make her sick"   Please advise Dr Halford Chessman. Thanks.

## 2013-11-05 NOTE — Telephone Encounter (Signed)
Can send script for prednisone 10 mg pills >> 3 pills for 2 days, 2 pills for 2 days, 1 pill for 2 days, dispense 12 pills with no refills.  Send script for cefuroxime 500 mg bid, dispense 14 pills, with no refills.  Please tell her to go to ER if she does not think she can tolerate medications at home.  Otherwise she needs to keep appointment on 11/07/13.

## 2013-11-07 ENCOUNTER — Encounter: Payer: Self-pay | Admitting: Pulmonary Disease

## 2013-11-07 ENCOUNTER — Ambulatory Visit (INDEPENDENT_AMBULATORY_CARE_PROVIDER_SITE_OTHER): Payer: Medicare Other | Admitting: Pulmonary Disease

## 2013-11-07 VITALS — BP 152/88 | HR 79 | Temp 98.3°F | Ht 64.0 in | Wt 241.2 lb

## 2013-11-07 DIAGNOSIS — J441 Chronic obstructive pulmonary disease with (acute) exacerbation: Secondary | ICD-10-CM

## 2013-11-07 DIAGNOSIS — R058 Other specified cough: Secondary | ICD-10-CM | POA: Insufficient documentation

## 2013-11-07 DIAGNOSIS — J4541 Moderate persistent asthma with (acute) exacerbation: Secondary | ICD-10-CM

## 2013-11-07 DIAGNOSIS — G4733 Obstructive sleep apnea (adult) (pediatric): Secondary | ICD-10-CM

## 2013-11-07 DIAGNOSIS — R05 Cough: Secondary | ICD-10-CM | POA: Insufficient documentation

## 2013-11-07 DIAGNOSIS — J449 Chronic obstructive pulmonary disease, unspecified: Secondary | ICD-10-CM | POA: Insufficient documentation

## 2013-11-07 MED ORDER — ALBUTEROL SULFATE (2.5 MG/3ML) 0.083% IN NEBU: 2.5000 mg | INHALATION_SOLUTION | Freq: Four times a day (QID) | RESPIRATORY_TRACT | Status: DC

## 2013-11-07 MED ORDER — FLUTICASONE PROPIONATE 50 MCG/ACT NA SUSP: 2.0000 | Freq: Every day | NASAL | Status: DC

## 2013-11-07 NOTE — Progress Notes (Signed)
Chief Complaint  Patient presents with  . Acute Visit    c/o sob with exertion x 1 wk.,wheezing,cough-occass. prod.pale yellow,no fcs,chest tightness occass.,nausea,    History of Present Illness: [name] is a 73 y.o. female former smoker with chronic cough from asthma, allergic rhinitis with post-nasal drip, and severe OSA on auto CPAP and 2.5 liters qhs.  She called office earlier this week with increased cough, wheeze, chest congestion and sinus congestion.  She was started on prednisone and cefuroxime.  Some of her symptoms are improved.  She is bringing up sputum more easily and is getting up clear to yellow sputum.  She denies chest pain, fever, or hemoptysis.  She has wheeze now from her throat and has more sinus congestion with post-nasal drip.  Her reflux symptoms are stable, and she does not feel like reflux is an issue at present.   Tests: RAST 04/20/11>>IgE 63.2, dust mites, cat/dog dander, elm, plantain CXR 09/30/11>>Moderate cardiac silhouette enlargement. Mild vascular congestion pattern. No consolidation or pleural effusion evident.  10/18/11 ENT evaluation negative [name]) PFT 11/29/11>>FEV1 0.97 (48%), FEV1% 67, TLC 2.82 (59%), DLCO 46%, +BD from FEF 25-75%. Echo 07/27/12 >> mod LVH, EF 65%, mild AS, mild MR, mod LA dilation, PAS 61 mmHg PSG 09/05/12 >> AHI 51.9, SpO2 low 81%. CPAP 09/24/12 to 10/23/12 >> used on 20 of 30 nights with average 2.8 hours. Average AHI 2.2 with median CPAP 7 cm H2O and 95 th percentile CPAP 10 cm H2O. ONO with CPAP and RA 10/09/12 >> Test time 3 hrs 33 min. Mean SpO2 92%, low SpO2 82%. Spent 33 min with SpO2 < 89%. ONO with CPAP and 2 liters 11/20/12 >> Test time 3 hrs 4 min. Mean SpO2 94.3%, low SpO2 77% (?artifact). Spent 10 min 32 sec with SpO2 < 89%.  PMHx, PSHx, Medications, Allergies, Fhx, Shx reviewed.  Physical Exam:  General - No distress, obese  HEENT - Left eye pstosis, wears glasses, no sinus tenderness, clear nasal  discharge, no oral exudate, no LAN, wheeze from throat Cardiac - s1s2 regular, 2/6 systolic murmur  Chest - prolonged exhalation, no wheeze/rales Abdomen - Soft, nontender Extremities - 1+ leg edema, valgus deformities Neurologic - normal strength, difficulty with recall memory  Skin - chronic venous stasis changes  Psychiatric - pleasant, normal mood/behavior    Assessment/Plan:  Nicole Mires, MD [place] 11/07/2013, 12:36 PM Pager:  [phone] After 3pm call: [phone]

## 2013-11-07 NOTE — Assessment & Plan Note (Signed)
Likely from rhinitis with post-nasal drip.  Will have her start flonase again.  Discussed allowing voice rest, and avoiding forced cough.

## 2013-11-07 NOTE — Patient Instructions (Signed)
Flonase two sprays each nostril daily for 1 week, then as needed Call if not feeling better by next week Follow up in 6 months

## 2013-11-07 NOTE — Assessment & Plan Note (Signed)
Continue CPAP with 2.5 liters oxygen at night.

## 2013-11-07 NOTE — Assessment & Plan Note (Signed)
She is to continue pulmicort, singulair, and prn albuterol.

## 2013-11-07 NOTE — Assessment & Plan Note (Signed)
She has some improvement since starting prednisone and cefuroxime >> she is to complete these.

## 2013-11-19 ENCOUNTER — Telehealth: Payer: Self-pay | Admitting: Pulmonary Disease

## 2013-11-19 MED ORDER — CEFUROXIME AXETIL 500 MG PO TABS
500.0000 mg | ORAL_TABLET | Freq: Two times a day (BID) | ORAL | Status: DC
Start: 2013-11-19 — End: 2014-05-29

## 2013-11-19 NOTE — Telephone Encounter (Signed)
Per 11/07/13 OV w/ VS; Patient Instructions       Flonase two sprays each nostril daily for 1 week, then as needed Call if not feeling better by next week Follow up in 6 months   Called spoke with pt. She reports she finished the ceftin and prednisone RX'd for her. She c/o wheezing, slight prod cough-light yellow phlem, PND, nasal congestion. Pt reports the ABX only helped a very little. Requesting recs. Please advise VS thanks  Allergies  Allergen Reactions  . Other Other (See Comments)    "all generics make her sick"     Current Outpatient Prescriptions on File Prior to Visit  Medication Sig Dispense Refill  . albuterol (PROAIR HFA) 108 (90 BASE) MCG/ACT inhaler Inhale 2 puffs into the lungs every 6 (six) hours as needed for wheezing.    Marland Kitchen albuterol (PROVENTIL) (2.5 MG/3ML) 0.083% nebulizer solution Take 3 mLs (2.5 mg total) by nebulization 4 (four) times daily. 75 mL 12  . amLODipine (NORVASC) 5 MG tablet Take 10 mg by mouth daily.     Marland Kitchen augmented betamethasone dipropionate (DIPROLENE-AF) 0.05 % cream Apply 1 application topically 2 (two) times daily. To both legs    . budesonide (PULMICORT) 0.25 MG/2ML nebulizer solution Take 2 mLs (0.25 mg total) by nebulization 2 (two) times daily. 60 mL 12  . Calcium Carbonate-Vitamin D (CALTRATE 600+D) 600-400 MG-UNIT per tablet Take 1 tablet by mouth daily.    . cefUROXime (CEFTIN) 500 MG tablet Take 1 tablet (500 mg total) by mouth 2 (two) times daily with a meal. 14 tablet 0  . fluticasone (FLONASE) 50 MCG/ACT nasal spray Place 2 sprays into both nostrils daily. 16 g 2  . folic acid (FOLVITE) A999333 MCG tablet Take 400 mcg by mouth daily.    . furosemide (LASIX) 20 MG tablet Take 3 tablets (60 mg total) by mouth 2 (two) times daily. Please take 60mg  twice daily for three days, then return to your typical dose 18 tablet 0  . glipiZIDE (GLUCOTROL XL) 10 MG 24 hr tablet Take 10 mg by mouth daily.     Marland Kitchen HUMALOG MIX 75/25 KWIKPEN (75-25) 100 UNIT/ML  SUSP Inject 15 Units into the skin at bedtime.     . insulin glargine (LANTUS) 100 UNIT/ML injection Inject 30 Units into the skin daily.     . iron polysaccharides (NIFEREX) 150 MG capsule Take 150 mg by mouth 2 (two) times daily.    Marland Kitchen losartan (COZAAR) 50 MG tablet Take 1 tablet (50 mg total) by mouth daily. 30 tablet 11  . magnesium oxide (MAG-OX) 400 MG tablet Take 400 mg by mouth daily.    Marland Kitchen NEXIUM 40 MG capsule Take 1 capsule in morning by mouth    . Omega-3 Fatty Acids (FISH OIL) 1000 MG CAPS Take 1 capsule by mouth daily.    . potassium chloride SA (K-DUR,KLOR-CON) 20 MEQ tablet Take 20 mEq by mouth daily.    . predniSONE (DELTASONE) 10 MG tablet Take 3 pills for 2 days, 2 x 2 days, 1 x 2 days 12 tablet 0  . prochlorperazine (COMPAZINE) 5 MG tablet Take 5 mg by mouth every 6 (six) hours as needed for nausea.    . Propylene Glycol (SYSTANE BALANCE) 0.6 % SOLN Apply 1 drop to eye 2 (two) times daily.    Marland Kitchen pyridOXINE (VITAMIN B-6) 100 MG tablet Take 100 mg by mouth daily.    Marland Kitchen SINGULAIR 10 MG tablet TAKE 1 TABLET BY MOUTH AT BEDTIME  30 tablet 5  . traMADol (ULTRAM) 50 MG tablet Take 50 mg by mouth every 6 (six) hours as needed for severe pain.    . vitamin B-12 (CYANOCOBALAMIN) 1000 MCG tablet Take 1,000 mcg by mouth daily.    . vitamin E 400 UNIT capsule Take 400 Units by mouth daily.     No current facility-administered medications on file prior to visit.

## 2013-11-19 NOTE — Telephone Encounter (Signed)
Called spoke with VS in elink Per VS: okay to refill the ceftin and stick with the flonase.  This is likely sinuses and PND.  Thanks.  Pt is aware.  Rx sent to verified pharmacy. Will sign off.

## 2013-11-19 NOTE — Telephone Encounter (Signed)
Refill ceftin.  Advise to continue flonase and nasal irrigation.

## 2013-11-19 NOTE — Telephone Encounter (Signed)
Please advise SN thanks 

## 2014-01-13 ENCOUNTER — Telehealth: Payer: Self-pay | Admitting: Pulmonary Disease

## 2014-01-13 NOTE — Telephone Encounter (Signed)
Needs ov with all meds in hand to regroup - no additional recs over the phone

## 2014-01-13 NOTE — Telephone Encounter (Signed)
Last OV 11-07-13. Pt states she is still having a dry cough and wheezing that she feels is no better since last visit. She states she is taking Flonase as directed, as well as her inhalers. She sates she does not feel her symptoms are any worse, just no better. Please advise. Morehouse Bing, CMA  Allergies  Allergen Reactions  . Other Other (See Comments)    "all generics make her sick"

## 2014-01-13 NOTE — Telephone Encounter (Signed)
Called pt and appt scheduled to see MW tomorrow for acute visit. Aware to bring all meds.

## 2014-01-14 ENCOUNTER — Telehealth: Payer: Self-pay | Admitting: Pulmonary Disease

## 2014-01-14 ENCOUNTER — Ambulatory Visit: Payer: Self-pay | Admitting: Internal Medicine

## 2014-01-14 MED ORDER — BENZONATATE 100 MG PO CAPS: 100.0000 mg | ORAL_CAPSULE | Freq: Three times a day (TID) | ORAL | Status: DC | PRN

## 2014-01-14 NOTE — Telephone Encounter (Signed)
rx sent. Pt is aware. Jennifer Castillo, CMA  

## 2014-01-14 NOTE — Telephone Encounter (Signed)
Spoke with pt. She could not come to her appointment due to transportation issues. Reports having a cough x4 weeks. Cough is dry and causes SOB. Has used OTC cough syrup with no relief. Would like something called in.  VS - please advise.

## 2014-01-14 NOTE — Telephone Encounter (Signed)
Can call in script for benzonatate 100 mg tid prn, dispense 60 pills with 1 refill.

## 2014-03-05 ENCOUNTER — Other Ambulatory Visit: Payer: Self-pay | Admitting: Pulmonary Disease

## 2014-04-03 ENCOUNTER — Other Ambulatory Visit: Payer: Self-pay

## 2014-04-03 DIAGNOSIS — Z1231 Encounter for screening mammogram for malignant neoplasm of breast: Secondary | ICD-10-CM

## 2014-04-21 ENCOUNTER — Ambulatory Visit: Payer: Medicare Other

## 2014-04-21 ENCOUNTER — Encounter: Payer: Self-pay | Admitting: Podiatry

## 2014-04-21 ENCOUNTER — Ambulatory Visit (INDEPENDENT_AMBULATORY_CARE_PROVIDER_SITE_OTHER): Payer: Medicare Other | Admitting: Podiatry

## 2014-04-21 VITALS — BP 184/93 | HR 83 | Resp 18

## 2014-04-21 DIAGNOSIS — M79606 Pain in leg, unspecified: Secondary | ICD-10-CM

## 2014-04-21 DIAGNOSIS — B351 Tinea unguium: Secondary | ICD-10-CM

## 2014-04-21 DIAGNOSIS — E1151 Type 2 diabetes mellitus with diabetic peripheral angiopathy without gangrene: Secondary | ICD-10-CM

## 2014-04-21 DIAGNOSIS — M79671 Pain in right foot: Secondary | ICD-10-CM

## 2014-04-21 DIAGNOSIS — Q828 Other specified congenital malformations of skin: Secondary | ICD-10-CM | POA: Diagnosis not present

## 2014-04-21 NOTE — Progress Notes (Signed)
   Subjective:    Patient ID: [name], female    DOB: Feb 15, 1940, 74 y.o.   MRN: [id number]  HPI my right foot is hurting and has been going on for about two months and feels like something jabbing me and feels like a hard place on the bottom and is sore and tender and there is no draining with it    Review of Systems  Constitutional: Positive for appetite change and fatigue.  HENT: Positive for sinus pressure and sneezing.   Eyes: Positive for itching.  Respiratory: Positive for wheezing.   Cardiovascular: Positive for leg swelling.  Gastrointestinal: Positive for diarrhea and constipation.  Endocrine:       Excessive thirst  Neurological: Positive for weakness and numbness.  Hematological:       Slow to heal  Psychiatric/Behavioral: Positive for confusion. The patient is nervous/anxious.        Objective:   Physical Exam        Assessment & Plan:

## 2014-04-22 ENCOUNTER — Ambulatory Visit
Admission: RE | Admit: 2014-04-22 | Discharge: 2014-04-22 | Disposition: A | Discharge status: Home / Self Care | Payer: Medicare Other | Source: Ambulatory Visit

## 2014-04-22 DIAGNOSIS — Z1231 Encounter for screening mammogram for malignant neoplasm of breast: Secondary | ICD-10-CM

## 2014-04-22 NOTE — Progress Notes (Signed)
Subjective:     Patient ID: [name], female   DOB: 1940-08-01, 74 y.o.   MRN: [id number]  HPI patient presents with caregiver with pain in the right foot midfoot area and lesion underneath the right second metatarsal that's painful and nail disease with thickness and yellow brittle type deformity with pain   Review of Systems  All other systems reviewed and are negative.      Objective:   Physical Exam  Constitutional: She is oriented to person, place, and time.  Cardiovascular: Intact distal pulses.   Musculoskeletal: Normal range of motion.  Neurological: She is oriented to person, place, and time.  Skin: Skin is warm.  Nursing note and vitals reviewed.  neurovascular status intact muscle strength adequate and range of motion subtalar midtarsal joint within normal limits. Patient's noted to have a keratotic lesion underneath the second metatarsal right and is found to have yellow brittle nailbeds 1-5 both feet that are painful and is noted to have pain in the midfoot right with mild swelling noted. 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 Debrided nailbeds 1-5 both feet and lesion under the right with no iatrogenic bleeding noted

## 2014-05-28 ENCOUNTER — Telehealth: Payer: Self-pay | Admitting: Pulmonary Disease

## 2014-05-28 ENCOUNTER — Ambulatory Visit: Payer: Self-pay | Admitting: Pulmonary Disease

## 2014-05-28 NOTE — Telephone Encounter (Signed)
Dr. Halford Chessman has several openings tomorrow, 05/29/14. Pt has been scheduled for 05/29/14 at 2:30pm. Nothing further was needed.

## 2014-05-29 ENCOUNTER — Ambulatory Visit (INDEPENDENT_AMBULATORY_CARE_PROVIDER_SITE_OTHER): Payer: Medicare Other | Admitting: Pulmonary Disease

## 2014-05-29 ENCOUNTER — Encounter: Payer: Self-pay | Admitting: Pulmonary Disease

## 2014-05-29 VITALS — BP 154/70 | HR 72 | Ht 64.0 in | Wt 237.6 lb

## 2014-05-29 DIAGNOSIS — R05 Cough: Secondary | ICD-10-CM | POA: Diagnosis not present

## 2014-05-29 DIAGNOSIS — G4733 Obstructive sleep apnea (adult) (pediatric): Secondary | ICD-10-CM | POA: Diagnosis not present

## 2014-05-29 DIAGNOSIS — E662 Morbid (severe) obesity with alveolar hypoventilation: Secondary | ICD-10-CM | POA: Diagnosis not present

## 2014-05-29 DIAGNOSIS — R058 Other specified cough: Secondary | ICD-10-CM

## 2014-05-29 DIAGNOSIS — J449 Chronic obstructive pulmonary disease, unspecified: Secondary | ICD-10-CM | POA: Diagnosis not present

## 2014-05-29 DIAGNOSIS — Z9989 Dependence on other enabling machines and devices: Principal | ICD-10-CM

## 2014-05-29 NOTE — Progress Notes (Signed)
Chief Complaint  Patient presents with  . Follow-up    Pt states that her breathing has been doing okay except on damp/humid days. Pt would like to get a POC. Wears CPAP some nights. Pt uses O2 at night. Some nights pt just uses O2 and not the CPAP.    History of Present Illness: [name] is a 74 y.o. female former smoker with chronic cough from asthma, allergic rhinitis with post-nasal drip, and severe OSA on auto CPAP and 2.5 liters qhs.  Her breathing has been stable for the most part.  She notices more trouble with pollen and damp weather.  She has occasional hoarseness.  She is not having sinus congestion.  She denies sputum, wheeze, fever, or chest pain.  She uses her CPAP some at night.  Sometimes she just sleeps with supplemental oxygen.  She is not sure if her oxygen is plugged into her CPAP.  She reports taking singulair nightly.  She uses budesonide and albuterol prn >> several times per week.  Tests: RAST 04/20/11>>IgE 63.2, dust mites, cat/dog dander, elm, plantain 10/18/11 ENT evaluation negative [place]) PFT 11/29/11>>FEV1 0.97 (48%), FEV1% 67, TLC 2.82 (59%), DLCO 46%, +BD from FEF 25-75%. Echo 07/27/12 >> mod LVH, EF 65%, mild AS, mild MR, mod LA dilation, PAS 61 mmHg PSG 09/05/12 >> AHI 51.9, SpO2 low 81%. CPAP 09/24/12 to 10/23/12 >> used on 20 of 30 nights with average 2.8 hours. Average AHI 2.2 with median CPAP 7 cm H2O and 95 th percentile CPAP 10 cm H2O. ONO with CPAP and RA 10/09/12 >> Test time 3 hrs 33 min. Mean SpO2 92%, low SpO2 82%. Spent 33 min with SpO2 < 89%. ONO with CPAP and 2 liters 11/20/12 >> Test time 3 hrs 4 min. Mean SpO2 94.3%, low SpO2 77% (?artifact). Spent 10 min 32 sec with SpO2 < 89%.  PMHx >> Depression, DM, GERD, PUD, HTN, HLD, Valgus Rt foot  PSHx, Medications, Allergies, Fhx, Shx reviewed.  Physical Exam: Blood pressure 154/70, pulse 72, height 5\' 4"  (1.626 m), weight 237 lb 9.6 oz (107.775 kg), SpO2 99 %. Body mass index is  40.76 kg/(m^2).  General - No distress, obese  HEENT - Left eye pstosis, wears glasses, no sinus tenderness, clear nasal discharge, no oral exudate, no LAN, wheeze from throat Cardiac - s1s2 regular, 2/6 systolic murmur  Chest - prolonged exhalation, no wheeze/rales Abdomen - Soft, nontender Extremities - 1+ leg edema, valgus deformities Neurologic - normal strength, difficulty with recall memory  Skin - chronic venous stasis changes  Psychiatric - pleasant, normal mood/behavior    Assessment/Plan:  COPD with asthma. Plan: - continue prn budesonide, albuterol - continue singulair  Obstructive sleep apnea. Plan: - will get CPAP download - advised her to use CPAP every night to get maximal benefit  Obesity hypoventilation syndrome. Explained that daytime supplemental oxygen is not indicated at this time. Plan: - will have [facility] ensure her equipment is set up properly - 2.5 liters oxygen at night with CPAP  Upper airway cough syndrome related to allergies and rhinitis with post nasal drip. Plan: - continue singulair   Chesley Mires, MD [place] 05/29/2014, 3:08 PM Pager:  [phone] After 3pm call: [phone]

## 2014-05-29 NOTE — Patient Instructions (Signed)
Budesonide one vial in nebulizer machine twice per day as needed for breathing Albuterol one vial in nebulizer machine every 4 to 6 hours as needed for breathing Montelukast (Singulair) 10 mg pill nightly for asthma and allergies Will get copy of CPAP report Will have [facility] make sure your CPAP and oxygen set up are plugged in correctly  Follow up in 1 year

## 2014-07-12 ENCOUNTER — Other Ambulatory Visit: Payer: Self-pay | Admitting: Pulmonary Disease

## 2014-08-11 ENCOUNTER — Ambulatory Visit: Payer: Medicare Other

## 2014-08-12 ENCOUNTER — Ambulatory Visit (INDEPENDENT_AMBULATORY_CARE_PROVIDER_SITE_OTHER): Payer: Medicare Other | Admitting: Podiatry

## 2014-08-12 ENCOUNTER — Encounter: Payer: Self-pay | Admitting: Podiatry

## 2014-08-12 DIAGNOSIS — M79606 Pain in leg, unspecified: Secondary | ICD-10-CM

## 2014-08-12 DIAGNOSIS — B351 Tinea unguium: Secondary | ICD-10-CM | POA: Diagnosis not present

## 2014-08-12 DIAGNOSIS — Q828 Other specified congenital malformations of skin: Secondary | ICD-10-CM

## 2014-08-12 DIAGNOSIS — E1151 Type 2 diabetes mellitus with diabetic peripheral angiopathy without gangrene: Secondary | ICD-10-CM

## 2014-08-12 DIAGNOSIS — M79673 Pain in unspecified foot: Secondary | ICD-10-CM

## 2014-08-12 NOTE — Progress Notes (Signed)
Subjective:     Patient ID: [name], female   DOB: 30-Jun-1940, 74 y.o.   MRN: [id number]  HPIThis patient presents to the office for continued treatment of her callus on her right midfoot. She also presents for nail care. She is diabetic with vascular and neurologic disease.    Review of Systems     Objective:   Physical Exam GENERAL APPEARANCE: Alert, conversant. Appropriately groomed. No acute distress.  VASCULAR: Pedal pulses are not  palpable at 2/4  Bilateral due to her severe foot swelling..  Capillary refill time is immediate to all digits,   NEUROLOGIC: sensation is diminished  epicritically and protectively to 5.07 monofilament at 5/5 sites bilateral.    MUSCULOSKELETAL: acceptable muscle strength, tone and stability bilateral.  Intrinsic muscluature intact bilateral.  Rectus appearance of foot and digits noted bilateral. 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  Severe venous stasis both anterior aspects of both legs. DERMATOLOGIC: skin color, texture, and turgor are within normal limits.  Plantat callus noted midfoot right foot due to underlying bone spur.  NAILS  THick disfigured discolored nails both feet.      Assessment:     Onychomycosis  Callus right foot.     Plan:     Debridement of Nails  Debridement of Callus.  RTC 3 mos.

## 2014-09-02 ENCOUNTER — Emergency Department (HOSPITAL_COMMUNITY)
Admission: EM | Admit: 2014-09-02 | Discharge: 2014-09-03 | Disposition: A | Discharge status: Home / Self Care | Payer: Medicare Other | Attending: Emergency Medicine | Admitting: Emergency Medicine

## 2014-09-02 ENCOUNTER — Encounter (HOSPITAL_COMMUNITY): Payer: Self-pay | Admitting: *Deleted

## 2014-09-02 DIAGNOSIS — F329 Major depressive disorder, single episode, unspecified: Secondary | ICD-10-CM | POA: Diagnosis not present

## 2014-09-02 DIAGNOSIS — Z86018 Personal history of other benign neoplasm: Secondary | ICD-10-CM | POA: Insufficient documentation

## 2014-09-02 DIAGNOSIS — E780 Pure hypercholesterolemia: Secondary | ICD-10-CM | POA: Insufficient documentation

## 2014-09-02 DIAGNOSIS — Z794 Long term (current) use of insulin: Secondary | ICD-10-CM | POA: Diagnosis not present

## 2014-09-02 DIAGNOSIS — Z8739 Personal history of other diseases of the musculoskeletal system and connective tissue: Secondary | ICD-10-CM | POA: Diagnosis not present

## 2014-09-02 DIAGNOSIS — Z8711 Personal history of peptic ulcer disease: Secondary | ICD-10-CM | POA: Diagnosis not present

## 2014-09-02 DIAGNOSIS — Z87891 Personal history of nicotine dependence: Secondary | ICD-10-CM | POA: Diagnosis not present

## 2014-09-02 DIAGNOSIS — Z7951 Long term (current) use of inhaled steroids: Secondary | ICD-10-CM | POA: Diagnosis not present

## 2014-09-02 DIAGNOSIS — K922 Gastrointestinal hemorrhage, unspecified: Secondary | ICD-10-CM | POA: Diagnosis not present

## 2014-09-02 DIAGNOSIS — D649 Anemia, unspecified: Secondary | ICD-10-CM

## 2014-09-02 DIAGNOSIS — I10 Essential (primary) hypertension: Secondary | ICD-10-CM | POA: Insufficient documentation

## 2014-09-02 DIAGNOSIS — Z79899 Other long term (current) drug therapy: Secondary | ICD-10-CM | POA: Insufficient documentation

## 2014-09-02 DIAGNOSIS — K625 Hemorrhage of anus and rectum: Secondary | ICD-10-CM | POA: Diagnosis present

## 2014-09-02 DIAGNOSIS — J449 Chronic obstructive pulmonary disease, unspecified: Secondary | ICD-10-CM | POA: Diagnosis not present

## 2014-09-02 DIAGNOSIS — E119 Type 2 diabetes mellitus without complications: Secondary | ICD-10-CM | POA: Diagnosis not present

## 2014-09-02 LAB — COMPREHENSIVE METABOLIC PANEL
ALT: 28 U/L (ref 14–54)
ANION GAP: 7 (ref 5–15)
AST: 18 U/L (ref 15–41)
Albumin: 3.1 g/dL — ABNORMAL LOW (ref 3.5–5.0)
Alkaline Phosphatase: 103 U/L (ref 38–126)
BUN: 40 mg/dL — ABNORMAL HIGH (ref 6–20)
CHLORIDE: 108 mmol/L (ref 101–111)
CO2: 25 mmol/L (ref 22–32)
Calcium: 9 mg/dL (ref 8.9–10.3)
Creatinine, Ser: 2.22 mg/dL — ABNORMAL HIGH (ref 0.44–1.00)
GFR calc non Af Amer: 21 mL/min — ABNORMAL LOW (ref >60)
GFR, EST AFRICAN AMERICAN: 24 mL/min — AB (ref >60)
Glucose, Bld: 274 mg/dL — ABNORMAL HIGH (ref 65–99)
Potassium: 4.6 mmol/L (ref 3.5–5.1)
SODIUM: 140 mmol/L (ref 135–145)
Total Bilirubin: 0.5 mg/dL (ref 0.3–1.2)
Total Protein: 6.8 g/dL (ref 6.5–8.1)

## 2014-09-02 LAB — TYPE AND SCREEN
ABO/RH(D): B POS
Antibody Screen: NEGATIVE

## 2014-09-02 LAB — CBC
HCT: 27.1 % — ABNORMAL LOW (ref 36.0–46.0)
HEMOGLOBIN: 9.2 g/dL — AB (ref 12.0–15.0)
MCH: 29.2 pg (ref 26.0–34.0)
MCHC: 33.9 g/dL (ref 30.0–36.0)
MCV: 86 fL (ref 78.0–100.0)
Platelets: 252 10*3/uL (ref 150–400)
RBC: 3.15 MIL/uL — AB (ref 3.87–5.11)
RDW: 14.5 % (ref 11.5–15.5)
WBC: 6.8 10*3/uL (ref 4.0–10.5)

## 2014-09-02 LAB — POC OCCULT BLOOD, ED: FECAL OCCULT BLD: POSITIVE — AB

## 2014-09-02 NOTE — Discharge Instructions (Signed)
If you were given medicines take as directed.  If you are on coumadin or contraceptives realize their levels and effectiveness is altered by many different medicines.  If you have any reaction (rash, tongues swelling, other) to the medicines stop taking and see a physician.    If your blood pressure was elevated in the ER make sure you follow up for management with a primary doctor or return for chest pain, shortness of breath or stroke symptoms.  Please follow up as directed and return to the ER or see a physician for new or worsening symptoms.  Thank you. Filed Vitals:   09/02/14 2215 09/02/14 2230 09/02/14 2245 09/02/14 2300  BP: 218/78 218/65 201/66 172/75  Pulse: 73 77 71 65  Temp:      TempSrc:      Resp: 18 17 22    Weight:      SpO2: 100% 98% 100% 98%    Bloody Stools Bloody stools means there is blood in your poop (stool). It is a sign that there is a problem somewhere in the digestive system. It is important for your doctor to find the cause of your bleeding, so the problem can be treated.  HOME CARE  Only take medicine as told by your doctor.  Eat foods with fiber (prunes, bran cereals).  Drink enough fluids to keep your pee (urine) clear or pale yellow.  Sit in warm water (sitz bath) for 10 to 15 minutes as told by your doctor.  Know how to take your medicines (enemas, suppositories) if advised by your doctor.  Watch for signs that you are getting better or getting worse. GET HELP RIGHT AWAY IF:   You are not getting better.  You start to get better but then get worse again.  You have new problems.  You have severe bleeding from the place where poop comes out (rectum) that does not stop.  You throw up (vomit) blood.  You feel weak or pass out (faint).  You have a fever. MAKE SURE YOU:   Understand these instructions.  Will watch your condition.  Will get help right away if you are not doing well or get worse. Document Released: 12/15/2008 Document  Revised: 03/21/2011 Document Reviewed: 05/14/2010 [facility] Patient Information 2015 [place], [place]. This information is not intended to replace advice given to you by your health care provider. Make sure you discuss any questions you have with your health care provider.

## 2014-09-02 NOTE — ED Notes (Signed)
Pt reports multiple episodes of bright red rectal bleeding since yesterday evening. Pt denies abdominal pain, N/V. Pt states that she has hx of hemorrhoids.

## 2014-09-02 NOTE — ED Provider Notes (Addendum)
CSN: [id number]     Arrival date & time 09/02/14  1828 History   First MD Initiated Contact with Patient 09/02/14 2238     Chief Complaint  Patient presents with  . Rectal Bleeding     (Consider location/radiation/quality/duration/timing/severity/associated sxs/prior Treatment) HPI Comments: 74 year old female with history of polyps, hemorrhoids, diverticular disease, COPD presents with few episodes of streaked blood in the stool since yesterday. Similar to previous. No syncope and no symptoms currently. No abdominal pain. Patient is seen lower GI in the past. Symptoms intermittent.  The history is provided by the patient.    Past Medical History  Diagnosis Date  . Arthritis   . Lumbar disc disease   . Asthma   . Depression   . Diabetes mellitus   . GERD (gastroesophageal reflux disease)   . Hypercholesterolemia   . HTN (hypertension)   . Peptic ulcer disease   . Valgus foot     right   . Gastric ulcer   . Adenomatous colon polyp   . Diverticulosis   . Internal hemorrhoids   . 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 Anemia   . Umbilical hernia   . 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 OSA (obstructive sleep apnea) 09/17/2012   Past Surgical History  Procedure Laterality Date  . Total knee arthroplasty      Bilateral  . Incise and drain abcess      Abdominal Wall  . Umbilical hernia repair  2007    incarcerated  . Lipoma resection      Right Neck  . Colonoscopy  2209 or 2010     2001, 2005, 2009  colonoscopy.  [place]  . Upper gastrointestinal endoscopy  2001    medoff Grade A esophagitis  . Colonoscopy N/A 08/14/2012    Procedure: COLONOSCOPY;  Surgeon: Ladene Artist, MD;  Location: [facility];  Service: Endoscopy;  Laterality: N/A;   Family History  Problem Relation Age of Onset  . Breast cancer Maternal Aunt    Social History  Substance Use Topics  . Smoking status: Former Smoker -- 2.00 packs/day for 5 years    Types: Cigarettes    Quit date:  01/10/1958  . Smokeless tobacco: Never Used  . Alcohol Use: No     Comment: Quit when she quit smoking   OB History    No data available     Review of Systems  Constitutional: Negative for fever and chills.  HENT: Negative for congestion.   Eyes: Negative for visual disturbance.  Respiratory: Negative for shortness of breath.   Cardiovascular: Negative for chest pain.  Gastrointestinal: Positive for blood in stool. Negative for vomiting and abdominal pain.  Genitourinary: Negative for dysuria and flank pain.  Musculoskeletal: Negative for back pain, neck pain and neck stiffness.  Skin: Negative for rash.  Neurological: Negative for light-headedness and headaches.      Allergies  Other  Home Medications   Prior to Admission medications   Medication Sig Start Date End Date Taking? Authorizing Provider  ACCU-CHEK AVIVA PLUS test strip  02/20/14   Historical Provider, MD  albuterol (PROAIR HFA) 108 (90 BASE) MCG/ACT inhaler Inhale 2 puffs into the lungs every 6 (six) hours as needed for wheezing. 04/20/11 05/29/14  Chesley Mires, MD  albuterol (PROVENTIL) (2.5 MG/3ML) 0.083% nebulizer solution Take 3 mLs (2.5 mg total) by nebulization 4 (four) times daily. 11/07/13   Chesley Mires, MD  Alcohol Swabs (ALCOHOL PREP) 70 % PADS  02/20/14  Historical Provider, MD  amLODipine (NORVASC) 5 MG tablet Take 10 mg by mouth daily.     Historical Provider, MD  ASSURE COMFORT LANCETS 30G [place]  02/20/14   Historical Provider, MD  augmented betamethasone dipropionate (DIPROLENE-AF) 0.05 % cream Apply 1 application topically 2 (two) times daily. To both legs    Historical Provider, MD  Blood Glucose Calibration (ACCU-CHEK AVIVA) SOLN  02/20/14   Historical Provider, MD  Blood Glucose Monitoring Suppl (ACCU-CHEK AVIVA PLUS) W/DEVICE KIT  02/20/14   Historical Provider, MD  budesonide (PULMICORT) 0.25 MG/2ML nebulizer solution Take 2 mLs (0.25 mg total) by nebulization 2 (two) times daily. 11/15/11   Chesley Mires, MD  budesonide (PULMICORT) 0.25 MG/2ML nebulizer solution TAKE 2 MLS (0.25 MG TOTAL) BY NEBULIZATION DAILY. 07/15/14   Chesley Mires, MD  Calcium Carbonate-Vitamin D (CALTRATE 600+D) 600-400 MG-UNIT per tablet Take 1 tablet by mouth daily.    Historical Provider, MD  fluticasone (FLONASE) 50 MCG/ACT nasal spray Place 2 sprays into both nostrils daily. 11/07/13   Chesley Mires, MD  folic acid (FOLVITE) 034 MCG tablet Take 400 mcg by mouth daily.    Historical Provider, MD  glipiZIDE (GLUCOTROL XL) 10 MG 24 hr tablet Take 10 mg by mouth daily.     Historical Provider, MD  HUMALOG MIX 75/25 KWIKPEN (75-25) 100 UNIT/ML SUSP Inject 15 Units into the skin at bedtime.  09/01/11   Historical Provider, MD  insulin glargine (LANTUS) 100 UNIT/ML injection Inject 30 Units into the skin daily.     Historical Provider, MD  iron polysaccharides (NIFEREX) 150 MG capsule Take 150 mg by mouth 2 (two) times daily.    Historical Provider, MD  Lancet Devices (ADJUSTABLE LANCING DEVICE) MISC  02/20/14   Historical Provider, MD  LASIX 40 MG tablet Take 80 mg by mouth daily. 03/27/14   Historical Provider, MD  losartan (COZAAR) 100 MG tablet  04/10/14   Historical Provider, MD  magnesium oxide (MAG-OX) 400 MG tablet Take 400 mg by mouth daily.    Historical Provider, MD  methocarbamol (ROBAXIN) 500 MG tablet Take 500 mg by mouth 3 (three) times daily as needed. 02/07/14   Historical Provider, MD  NEXIUM 40 MG capsule Take 1 capsule in morning by mouth 10/24/13   Historical Provider, MD  Omega-3 Fatty Acids (FISH OIL) 1000 MG CAPS Take 1 capsule by mouth daily.    Historical Provider, MD  potassium chloride SA (K-DUR,KLOR-CON) 20 MEQ tablet Take 20 mEq by mouth daily.    Historical Provider, MD  prochlorperazine (COMPAZINE) 5 MG tablet Take 5 mg by mouth every 6 (six) hours as needed for nausea.    Historical Provider, MD  Propylene Glycol (SYSTANE BALANCE) 0.6 % SOLN Apply 1 drop to eye 2 (two) times daily.    Historical  Provider, MD  pyridOXINE (VITAMIN B-6) 100 MG tablet Take 100 mg by mouth daily.    Historical Provider, MD  SINGULAIR 10 MG tablet TAKE 1 TABLET BY MOUTH AT BEDTIME *BRAND ONLY DO NOT CHANGE.* 03/05/14   Chesley Mires, MD  traMADol (ULTRAM) 50 MG tablet Take 50 mg by mouth every 6 (six) hours as needed for severe pain.    Historical Provider, MD  vitamin B-12 (CYANOCOBALAMIN) 1000 MCG tablet Take 1,000 mcg by mouth daily.    Historical Provider, MD  Vitamin D, Ergocalciferol, (DRISDOL) 50000 UNITS CAPS capsule Take 50,000 Units by mouth 2 (two) times a week. 04/07/14   Historical Provider, MD  vitamin E 400 UNIT  capsule Take 400 Units by mouth daily.    Historical Provider, MD  ZOCOR 20 MG tablet Take 20 mg by mouth daily. 03/16/14   Historical Provider, MD   BP 205/61 mmHg  Pulse 61  Temp(Src) 98.1 F (36.7 C) (Oral)  Resp 15  Wt 240 lb (108.863 kg)  SpO2 100% Physical Exam  Constitutional: She is oriented to person, place, and time. She appears well-developed and well-nourished.  HENT:  Head: Normocephalic and atraumatic.  Eyes: Conjunctivae are normal. Right eye exhibits no discharge. Left eye exhibits no discharge.  Neck: Normal range of motion. Neck supple. No tracheal deviation present.  Cardiovascular: Normal rate and regular rhythm.   Pulmonary/Chest: Effort normal and breath sounds normal.  Abdominal: Soft. She exhibits no distension. There is no tenderness. There is no guarding.  Genitourinary:  Brown stool and no hemorrhoids appreciated  Musculoskeletal: She exhibits no edema.  Neurological: She is alert and oriented to person, place, and time.  Skin: Skin is warm. No rash noted.  Psychiatric: She has a normal mood and affect.  Nursing note and vitals reviewed.   ED Course  Procedures (including critical care time) Labs Review Labs Reviewed  COMPREHENSIVE METABOLIC PANEL - Abnormal; Notable for the following:    Glucose, Bld 274 (*)    BUN 40 (*)    Creatinine, Ser 2.22  (*)    Albumin 3.1 (*)    GFR calc non Af Amer 21 (*)    GFR calc Af Amer 24 (*)    All other components within normal limits  CBC - Abnormal; Notable for the following:    RBC 3.15 (*)    Hemoglobin 9.2 (*)    HCT 27.1 (*)    All other components within normal limits  POC OCCULT BLOOD, ED  I-STAT CHEM 8, ED  POC OCCULT BLOOD, ED  TYPE AND SCREEN    Imaging Review No results found. I have personally reviewed and evaluated these images and lab results as part of my medical decision-making.   EKG Interpretation None      MDM   Final diagnoses:  Lower GI bleeding  Chronic anemia   Patient with history of diverticulosis, hemorrhoid and bleeding presents with recurrent bright red blood in the stools without abdominal pain. Patient has a history of hemorrhoids as well. Patient well-appearing on exam no abdominal pain no active bleeding in the ER patient had a bowel movement with streaked blood. Plan for repeat hemoglobin if stable patient is to follow-up closely with her GI doctor.  Results and differential diagnosis were discussed with the patient/parent/guardian. Xrays were independently reviewed by myself.  Close follow up outpatient was discussed, comfortable with the plan.   Medications - No data to display  Filed Vitals:   09/02/14 2215 09/02/14 2230 09/02/14 2245 09/02/14 2300  BP: 218/78 218/65 201/66 172/75  Pulse: 73 77 71 65  Temp:      TempSrc:      Resp: _0 Weight:      SpO2: 100% 98% 100% 98%    Final diagnoses:  Lower GI bleeding  Chronic anemia  CRF      Elnora Morrison, MD 09/02/14 [id number]  Elnora Morrison, MD 09/02/14 [phone]

## 2014-09-03 LAB — I-STAT CHEM 8, ED
BUN: 44 mg/dL — ABNORMAL HIGH (ref 6–20)
CALCIUM ION: 1.17 mmol/L (ref 1.13–1.30)
CHLORIDE: 105 mmol/L (ref 101–111)
Creatinine, Ser: 2.2 mg/dL — ABNORMAL HIGH (ref 0.44–1.00)
Glucose, Bld: 249 mg/dL — ABNORMAL HIGH (ref 65–99)
HCT: 28 % — ABNORMAL LOW (ref 36.0–46.0)
Hemoglobin: 9.5 g/dL — ABNORMAL LOW (ref 12.0–15.0)
POTASSIUM: 4.6 mmol/L (ref 3.5–5.1)
SODIUM: 140 mmol/L (ref 135–145)
TCO2: 22 mmol/L (ref 0–100)

## 2014-09-03 NOTE — ED Notes (Signed)
Pt stable, ambulatory, states understanding of discharge instructions, daughter at bedside 

## 2014-10-31 ENCOUNTER — Other Ambulatory Visit: Payer: Self-pay | Admitting: Pulmonary Disease

## 2014-11-20 ENCOUNTER — Ambulatory Visit: Payer: Medicare Other | Admitting: Adult Health

## 2014-11-25 ENCOUNTER — Encounter: Payer: Self-pay | Admitting: Podiatry

## 2014-11-25 ENCOUNTER — Ambulatory Visit (INDEPENDENT_AMBULATORY_CARE_PROVIDER_SITE_OTHER): Payer: Medicare Other | Admitting: Podiatry

## 2014-11-25 DIAGNOSIS — E1151 Type 2 diabetes mellitus with diabetic peripheral angiopathy without gangrene: Secondary | ICD-10-CM | POA: Diagnosis not present

## 2014-11-25 DIAGNOSIS — Q828 Other specified congenital malformations of skin: Secondary | ICD-10-CM | POA: Diagnosis not present

## 2014-11-25 DIAGNOSIS — B351 Tinea unguium: Secondary | ICD-10-CM

## 2014-11-25 DIAGNOSIS — M79606 Pain in leg, unspecified: Secondary | ICD-10-CM | POA: Diagnosis not present

## 2014-11-25 NOTE — Progress Notes (Signed)
Subjective:     Patient ID: [name], female   DOB: Nov 09, 1940, 74 y.o.   MRN: [id number]  HPIThis patient presents to the office for continued treatment of her callus on her right midfoot. She also presents for nail care. She is diabetic with vascular and neurologic disease. She has severe flatfeet both feet.    Review of Systems     Objective:   Physical Exam GENERAL APPEARANCE: Alert, conversant. Appropriately groomed. No acute distress.  VASCULAR: Pedal pulses are not  palpable at 2/4  Bilateral due to her severe foot swelling..  Capillary refill time is immediate to all digits,   NEUROLOGIC: sensation is diminished  epicritically and protectively to 5.07 monofilament at 5/5 sites bilateral.    MUSCULOSKELETAL: acceptable muscle strength, tone and stability bilateral.  Intrinsic muscluature intact bilateral.  Rectus appearance of foot and digits noted bilateral. 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  Severe venous stasis both anterior aspects of both legs. DERMATOLOGIC: skin color, texture, and turgor are within normal limits.  Plantat callus noted midfoot right foot due to underlying bone spur.  NAILS  Thick disfigured discolored nails both feet.      Assessment:     Onychomycosis  Callus right foot.     Plan:     Debridement of Nails  Debridement of Callus.  RTC 3 mos.  Gardiner Barefoot DPM

## 2014-11-28 ENCOUNTER — Other Ambulatory Visit: Payer: Self-pay | Admitting: Pulmonary Disease

## 2015-02-27 ENCOUNTER — Ambulatory Visit (INDEPENDENT_AMBULATORY_CARE_PROVIDER_SITE_OTHER): Payer: Medicare Other | Admitting: Podiatry

## 2015-02-27 ENCOUNTER — Encounter: Payer: Self-pay | Admitting: Podiatry

## 2015-02-27 DIAGNOSIS — E1151 Type 2 diabetes mellitus with diabetic peripheral angiopathy without gangrene: Secondary | ICD-10-CM

## 2015-02-27 DIAGNOSIS — Q828 Other specified congenital malformations of skin: Secondary | ICD-10-CM | POA: Diagnosis not present

## 2015-02-27 NOTE — Progress Notes (Signed)
Subjective:     Patient ID: [name], female   DOB: Apr 05, 1940, 75 y.o.   MRN: [id number]  HPIThis patient presents to the office for continued treatment of her callus on her right midfoot. She also presents for nail care. She is diabetic with vascular and neurologic disease. She has severe flatfeet both feet.    Review of Systems     Objective:   Physical Exam GENERAL APPEARANCE: Alert, conversant. Appropriately groomed. No acute distress.  VASCULAR: Pedal pulses are not  palpable at 2/4  Bilateral due to her severe foot swelling..  Capillary refill time is immediate to all digits,   NEUROLOGIC: sensation is diminished  epicritically and protectively to 5.07 monofilament at 5/5 sites bilateral.    MUSCULOSKELETAL: acceptable muscle strength, tone and stability bilateral.  Intrinsic muscluature intact bilateral.  Rectus appearance of foot and digits noted bilateral. 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  Severe venous stasis both anterior aspects of both legs. DERMATOLOGIC: skin color, texture, and turgor are within normal limits.  Plantat callus noted midfoot right foot due to underlying bone spur.  NAILS  Thick disfigured discolored nails both feet. Asymptomatic today.      Assessment:      Callus right foot.     Plan:       Debridement of Callus.  RTC 3 mos. Dispense diabetic insoles.  Gardiner Barefoot DPM

## 2015-03-31 ENCOUNTER — Other Ambulatory Visit: Payer: Self-pay

## 2015-03-31 DIAGNOSIS — Z1231 Encounter for screening mammogram for malignant neoplasm of breast: Secondary | ICD-10-CM

## 2015-04-13 ENCOUNTER — Telehealth: Payer: Self-pay | Admitting: Pulmonary Disease

## 2015-04-13 NOTE — Telephone Encounter (Signed)
Called spoke with patient's friend and [place] who is asking if pt may take Ipratropium neb soln that was obtained from another friend.  Pt is currently taking Budesonide and Albuterol neb solns.  Mae is aware for pt to NOT take this medication unless [facility] authorizes it and is okay with a call back tomorrow.  Mae denies any increase in pt's symptoms, she is at baseline.  Dr Halford Chessman please advise, thank you.  Upcoming visit w/ VS 5.9.17 Last ov 5.19.16 with [facility]: Patient Instructions       Budesonide one vial in nebulizer machine twice per day as needed for breathing Albuterol one vial in nebulizer machine every 4 to 6 hours as needed for breathing Montelukast (Singulair) 10 mg pill nightly for asthma and allergies Will get copy of CPAP report Will have [facility] make sure your CPAP and oxygen set up are plugged in correctly  Follow up in 1 year

## 2015-04-14 NOTE — Telephone Encounter (Signed)
She doesn't need to use ipratropium.

## 2015-04-14 NOTE — Telephone Encounter (Signed)
Spoke with patient-she is to NOT take any Ipratropium nebulizer medicine. Nothing more needed at this time.

## 2015-04-23 ENCOUNTER — Ambulatory Visit
Admission: RE | Admit: 2015-04-23 | Discharge: 2015-04-23 | Disposition: A | Discharge status: Home / Self Care | Payer: Medicare Other | Source: Ambulatory Visit

## 2015-04-23 DIAGNOSIS — Z1231 Encounter for screening mammogram for malignant neoplasm of breast: Secondary | ICD-10-CM

## 2015-05-19 ENCOUNTER — Ambulatory Visit (INDEPENDENT_AMBULATORY_CARE_PROVIDER_SITE_OTHER)
Admission: RE | Admit: 2015-05-19 | Discharge: 2015-05-19 | Disposition: A | Discharge status: Home / Self Care | Payer: Medicare Other | Source: Ambulatory Visit | Attending: Pulmonary Disease | Admitting: Pulmonary Disease

## 2015-05-19 ENCOUNTER — Ambulatory Visit (INDEPENDENT_AMBULATORY_CARE_PROVIDER_SITE_OTHER): Payer: Medicare Other | Admitting: Pulmonary Disease

## 2015-05-19 ENCOUNTER — Encounter: Payer: Self-pay | Admitting: Pulmonary Disease

## 2015-05-19 VITALS — BP 148/86 | HR 87 | Ht 64.0 in | Wt 233.0 lb

## 2015-05-19 DIAGNOSIS — R0600 Dyspnea, unspecified: Secondary | ICD-10-CM

## 2015-05-19 MED ORDER — IPRATROPIUM-ALBUTEROL 0.5-2.5 (3) MG/3ML IN SOLN: 3.0000 mL | Freq: Four times a day (QID) | RESPIRATORY_TRACT | Status: DC | PRN

## 2015-05-19 NOTE — Progress Notes (Signed)
Current Outpatient Prescriptions on File Prior to Visit  Medication Sig  . ACCU-CHEK AVIVA PLUS test strip   . albuterol (PROVENTIL) (2.5 MG/3ML) 0.083% nebulizer solution USE 1 VIAL IN NEBULIZER 4 TIMES DAILY  . Alcohol Swabs (ALCOHOL PREP) 70 % PADS   . amLODipine (NORVASC) 5 MG tablet Take 10 mg by mouth daily.   . ASSURE COMFORT LANCETS 30G MISC   . augmented betamethasone dipropionate (DIPROLENE-AF) 0.05 % cream Apply 1 application topically 2 (two) times daily. To both legs  . Blood Glucose Calibration (ACCU-CHEK AVIVA) SOLN   . Blood Glucose Monitoring Suppl (ACCU-CHEK AVIVA PLUS) W/DEVICE KIT   . budesonide (PULMICORT) 0.25 MG/2ML nebulizer solution Take 2 mLs (0.25 mg total) by nebulization 2 (two) times daily.  . budesonide (PULMICORT) 0.25 MG/2ML nebulizer solution TAKE 2 MLS (0.25 MG TOTAL) BY NEBULIZATION DAILY.  . Calcium Carbonate-Vitamin D (CALTRATE 600+D) 600-400 MG-UNIT per tablet Take 1 tablet by mouth daily.  . fluticasone (FLONASE) 50 MCG/ACT nasal spray Place 2 sprays into both nostrils daily.  . folic acid (FOLVITE) 242 MCG tablet Take 400 mcg by mouth daily.  Marland Kitchen glipiZIDE (GLUCOTROL XL) 10 MG 24 hr tablet Take 10 mg by mouth daily.   Marland Kitchen HUMALOG MIX 75/25 KWIKPEN (75-25) 100 UNIT/ML SUSP Inject 15 Units into the skin at bedtime.   . insulin glargine (LANTUS) 100 UNIT/ML injection Inject 30 Units into the skin daily.   . iron polysaccharides (NIFEREX) 150 MG capsule Take 150 mg by mouth 2 (two) times daily.  Elmore Guise Devices (ADJUSTABLE LANCING DEVICE) MISC   . LASIX 40 MG tablet Take 80 mg by mouth daily.  Marland Kitchen losartan (COZAAR) 100 MG tablet   . magnesium oxide (MAG-OX) 400 MG tablet Take 400 mg by mouth daily.  . methocarbamol (ROBAXIN) 500 MG tablet Take 500 mg by mouth 3 (three) times daily as needed.  Marland Kitchen NEXIUM 40 MG capsule Take 1 capsule in morning by mouth  . Omega-3 Fatty Acids (FISH OIL) 1000 MG CAPS Take 1 capsule by mouth daily.  . potassium chloride SA  (K-DUR,KLOR-CON) 20 MEQ tablet Take 20 mEq by mouth daily.  . prochlorperazine (COMPAZINE) 5 MG tablet Take 5 mg by mouth every 6 (six) hours as needed for nausea.  . Propylene Glycol (SYSTANE BALANCE) 0.6 % SOLN Apply 1 drop to eye 2 (two) times daily.  Marland Kitchen pyridOXINE (VITAMIN B-6) 100 MG tablet Take 100 mg by mouth daily.  Marland Kitchen SINGULAIR 10 MG tablet TAKE 1 TABLET BY MOUTH AT BEDTIME *BRAND ONLY DO NOT CHANGE.*  . traMADol (ULTRAM) 50 MG tablet Take 50 mg by mouth every 6 (six) hours as needed for severe pain.  . vitamin B-12 (CYANOCOBALAMIN) 1000 MCG tablet Take 1,000 mcg by mouth daily.  . Vitamin D, Ergocalciferol, (DRISDOL) 50000 UNITS CAPS capsule Take 50,000 Units by mouth 2 (two) times a week.  . vitamin E 400 UNIT capsule Take 400 Units by mouth daily.  Marland Kitchen ZOCOR 20 MG tablet Take 20 mg by mouth daily.   No current facility-administered medications on file prior to visit.    Chief Complaint  Patient presents with  . Follow-up    Pt c/o worsening SOB with exertion and some at rest, cough and wheezing x 2-3 weeks. Tries to wear CPAP nightly-Pt states that she had issues with her CPAP machine collecting water in mask -  [facility] has fixed this. Pt uses O2 @ 2.5L at night with CPAP. Wants to be evaluated for POC for daytime  use.    Tests RAST 04/20/11>>IgE 63.2, dust mites, cat/dog dander, elm, plantain 10/18/11 ENT evaluation negative [facility]) PFT 11/29/11>>FEV1 0.97 (48%), FEV1% 67, TLC 2.82 (59%), DLCO 46%, +BD from FEF 25-75%. Echo 07/27/12 >> mod LVH, EF 65%, mild AS, mild MR, mod LA dilation, PAS 61 mmHg PSG 09/05/12 >> AHI 51.9, SpO2 low 81%. CPAP 09/24/12 to 10/23/12 >> used on 20 of 30 nights with average 2.8 hours. Average AHI 2.2 with median CPAP 7 cm H2O and 95 th percentile CPAP 10 cm H2O. ONO with CPAP and RA 10/09/12 >> Test time 3 hrs 33 min. Mean SpO2 92%, low SpO2 82%. Spent 33 min with SpO2 < 89%. ONO with CPAP and 2 liters 11/20/12 >> Test time 3 hrs 4 min. Mean SpO2  94.3%, low SpO2 77% (?artifact). Spent 10 min 32 sec with SpO2 < 89%.  Past medical history Depression, DM, GERD, PUD, HTN, HLD, Valgus Rt foot  Past surgical history, Family history, Social history, Allergies  Vital signs BP 148/86 mmHg  Pulse 87  Ht _0  (1.626 m)  Wt 233 lb (105.688 kg)  BMI 39.97 kg/m2  SpO2 98%   History of Present Illness: [name] is a 75 y.o. female former smoker with chronic cough from asthma, allergic rhinitis with post-nasal drip, and severe OSA on auto CPAP and 2.5 liters qhs.  She has noticed more trouble with her breathing.  She gets wheezing from her neck.  She his getting cough, but usually not much sputum.  She feels congested in her throat.  She has not been using flonase.  She is taking nexium.  She has been using pulmicort and albuterol >> she is not sure if these help.  She has several samples of duoneb, and would prefer to use this in place of albuterol.  She is still using singulair  She had to stop using CPAP >> had water build up in tube and mask.  Her humidifier was turned down, and this has helped.  She is planning to resume CPAP.  She gets winded with minimal exertion.  She thinks she needs to use oxygen during the day.  Physical Exam:  General - No distress, obese  HEENT - Left eye pstosis, wears glasses, no sinus tenderness, clear nasal discharge, no oral exudate, no LAN, wheeze from throat Cardiac - s1s2 regular, 2/6 systolic murmur  Chest - prolonged exhalation, no wheeze/rales Abdomen - Soft, nontender Extremities - 1+ leg edema, valgus deformities Neurologic - normal strength, difficulty with recall memory  Skin - chronic venous stasis changes  Psychiatric - pleasant, normal mood/behavior  Discussion: I think most of her issues are related to upper airway irritability due to post nasal drip and reflux.  She likely also has dyspnea related to deconditioning.  Assessment/Plan:  COPD with asthma. Plan: - continue  budesonide and singulair - advised it is okay to change to duoneb in place of albuterol - will repeat chest xray today to further assess whether there is additional cause of her dyspnea  Obstructive sleep apnea. - advised her to resume auto CPAP  Obesity hypoventilation syndrome. - she did not have oxygen desaturation with ambulation today >> Explained that daytime supplemental oxygen is not indicated at this time. - 2.5 liters oxygen at night with CPAP  Upper airway cough syndrome related to allergies and rhinitis with post nasal drip. - resume flonase - continue singulair - she might need evaluation with speech therapy  Laryngopharyngeal reflux. - continue nexium  Patient Instructions  Budesonide one vial nebulized twice per day >> rinse mouth after each use  Albuterol and Ipratropium one vial nebulized every 4 to 6 hours as needed for cough, wheezing, or chest congestion  Flonase 1 spray in each nostril daily  Nexium 40 mg daily >> take 30 minutes before first meal of the day  Chest xray today  Follow up in 4 months    Chesley Mires, MD [place] 05/19/2015, 12:09 PM Pager:  [phone]

## 2015-05-19 NOTE — Patient Instructions (Signed)
Budesonide one vial nebulized twice per day >> rinse mouth after each use  Albuterol and Ipratropium one vial nebulized every 4 to 6 hours as needed for cough, wheezing, or chest congestion  Flonase 1 spray in each nostril daily  Nexium 40 mg daily >> take 30 minutes before first meal of the day  Chest xray today  Follow up in 4 months

## 2015-05-20 ENCOUNTER — Telehealth: Payer: Self-pay | Admitting: Pulmonary Disease

## 2015-05-20 NOTE — Telephone Encounter (Signed)
Dg Chest 2 View  05/19/2015  CLINICAL DATA:  Dyspnea EXAM: CHEST  2 VIEW COMPARISON:  03/06/2013 FINDINGS: Cardiomegaly and pulmonary venous congestion with diffuse interstitial coarsening but no definitive Kerley lines. No effusion or air leak. Negative aortic and hilar contours. IMPRESSION: Cardiomegaly and mild CHF. Electronically Signed   By: Monte Fantasia M.D.   On: 05/19/2015 13:10    She normally takes 80 mg lasix daily (40 mg pills).  Advised pt to take lasix 80 mg in the morning and 40 mg in the afternoon x next 2 days.  She is to call back if breathing gets worse.

## 2015-05-22 ENCOUNTER — Telehealth: Payer: Self-pay | Admitting: Pulmonary Disease

## 2015-05-22 NOTE — Telephone Encounter (Signed)
LMTCB

## 2015-05-26 NOTE — Telephone Encounter (Signed)
This should be done by her PCP.

## 2015-05-26 NOTE — Telephone Encounter (Signed)
Pt aware that she needs to contact her PCP for this request. Nothing further needed.

## 2015-05-26 NOTE — Telephone Encounter (Signed)
Spoke with pt, requesting new order for walker with sitdown chair to be sent to [facility].   Pt states that her current walker with a sitdown chair has broken.   VS ok to place new order?  Thanks!

## 2015-06-05 ENCOUNTER — Ambulatory Visit (INDEPENDENT_AMBULATORY_CARE_PROVIDER_SITE_OTHER): Payer: Medicare Other | Admitting: Podiatry

## 2015-06-05 DIAGNOSIS — E1151 Type 2 diabetes mellitus with diabetic peripheral angiopathy without gangrene: Secondary | ICD-10-CM

## 2015-06-05 DIAGNOSIS — Q828 Other specified congenital malformations of skin: Secondary | ICD-10-CM | POA: Diagnosis not present

## 2015-06-05 NOTE — Progress Notes (Signed)
Subjective:     Patient ID: [name], female   DOB: 10-05-1940, 75 y.o.   MRN: [id number]  HPIThis patient presents to the office for continued treatment of her callus on her right midfoot. She also presents for nail care. She is diabetic with vascular and neurologic disease. She has severe flatfeet both feet.    Review of Systems     Objective:   Physical Exam GENERAL APPEARANCE: Alert, conversant. Appropriately groomed. No acute distress.  VASCULAR: Pedal pulses are not  palpable at 2/4  Bilateral due to her severe foot swelling..  Capillary refill time is immediate to all digits,   NEUROLOGIC: sensation is diminished  epicritically and protectively to 5.07 monofilament at 5/5 sites bilateral.    MUSCULOSKELETAL: acceptable muscle strength, tone and stability bilateral.  Intrinsic muscluature intact bilateral.  Rectus appearance of foot and digits noted bilateral. 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  Severe venous stasis both anterior aspects of both legs. DERMATOLOGIC: skin color, texture, and turgor are within normal limits.  Plantat callus noted midfoot right foot due to underlying bone spur.  NAILS  Thick disfigured discolored nails both feet. Asymptomatic today.      Assessment:      Callus right foot.     Plan:       Debridement of Callus.  RTC 3 mos. Dispense diabetic insoles and the insoles were adjusted to help eliminate foot pain.  Gardiner Barefoot DPM

## 2015-07-09 ENCOUNTER — Other Ambulatory Visit: Payer: Self-pay | Admitting: Pulmonary Disease

## 2015-09-04 ENCOUNTER — Ambulatory Visit: Payer: Medicare Other | Admitting: Podiatry

## 2015-09-22 ENCOUNTER — Ambulatory Visit: Payer: Medicare Other | Admitting: Podiatry

## 2015-09-23 ENCOUNTER — Ambulatory Visit: Payer: Medicare Other | Admitting: Pulmonary Disease

## 2015-10-28 ENCOUNTER — Ambulatory Visit (INDEPENDENT_AMBULATORY_CARE_PROVIDER_SITE_OTHER): Payer: Self-pay | Admitting: Family

## 2015-11-17 ENCOUNTER — Ambulatory Visit: Payer: Medicare Other | Admitting: Pulmonary Disease

## 2015-11-19 ENCOUNTER — Encounter: Payer: Self-pay | Admitting: Pulmonary Disease

## 2015-11-19 ENCOUNTER — Ambulatory Visit (INDEPENDENT_AMBULATORY_CARE_PROVIDER_SITE_OTHER): Payer: Medicare Other | Admitting: Pulmonary Disease

## 2015-11-19 VITALS — BP 160/94 | HR 73 | Ht 64.0 in | Wt 234.0 lb

## 2015-11-19 DIAGNOSIS — J449 Chronic obstructive pulmonary disease, unspecified: Secondary | ICD-10-CM | POA: Diagnosis not present

## 2015-11-19 DIAGNOSIS — Z23 Encounter for immunization: Secondary | ICD-10-CM | POA: Diagnosis not present

## 2015-11-19 DIAGNOSIS — R058 Other specified cough: Secondary | ICD-10-CM

## 2015-11-19 DIAGNOSIS — R05 Cough: Secondary | ICD-10-CM | POA: Diagnosis not present

## 2015-11-19 DIAGNOSIS — E662 Morbid (severe) obesity with alveolar hypoventilation: Secondary | ICD-10-CM

## 2015-11-19 NOTE — Patient Instructions (Signed)
High dose flu shot today  Follow up in 6 months 

## 2015-11-19 NOTE — Progress Notes (Signed)
Current Outpatient Prescriptions on File Prior to Visit  Medication Sig  . ACCU-CHEK AVIVA PLUS test strip   . Alcohol Swabs (ALCOHOL PREP) 70 % PADS   . amLODipine (NORVASC) 5 MG tablet Take 10 mg by mouth daily.   . ASSURE COMFORT LANCETS 30G MISC   . augmented betamethasone dipropionate (DIPROLENE-AF) 0.05 % cream Apply 1 application topically 2 (two) times daily. To both legs  . Blood Glucose Calibration (ACCU-CHEK AVIVA) SOLN   . Blood Glucose Monitoring Suppl (ACCU-CHEK AVIVA PLUS) W/DEVICE KIT   . budesonide (PULMICORT) 0.25 MG/2ML nebulizer solution Take 2 mLs (0.25 mg total) by nebulization 2 (two) times daily.  . Calcium Carbonate-Vitamin D (CALTRATE 600+D) 600-400 MG-UNIT per tablet Take 1 tablet by mouth daily.  . fluticasone (FLONASE) 50 MCG/ACT nasal spray Place 2 sprays into both nostrils daily.  . folic acid (FOLVITE) 433 MCG tablet Take 400 mcg by mouth daily.  Marland Kitchen glipiZIDE (GLUCOTROL XL) 10 MG 24 hr tablet Take 10 mg by mouth daily.   Marland Kitchen HUMALOG MIX 75/25 KWIKPEN (75-25) 100 UNIT/ML SUSP Inject 15 Units into the skin at bedtime.   . insulin glargine (LANTUS) 100 UNIT/ML injection Inject 30 Units into the skin daily.   Marland Kitchen ipratropium-albuterol (DUONEB) 0.5-2.5 (3) MG/3ML SOLN Take 3 mLs by nebulization every 6 (six) hours as needed.  . iron polysaccharides (NIFEREX) 150 MG capsule Take 150 mg by mouth 2 (two) times daily.  Elmore Guise Devices (ADJUSTABLE LANCING DEVICE) MISC   . LASIX 40 MG tablet Take 80 mg by mouth daily.  Marland Kitchen losartan (COZAAR) 100 MG tablet   . magnesium oxide (MAG-OX) 400 MG tablet Take 400 mg by mouth daily.  . methocarbamol (ROBAXIN) 500 MG tablet Take 500 mg by mouth 3 (three) times daily as needed.  . montelukast (SINGULAIR) 10 MG tablet TAKE 1 TABLET BY MOUTH AT BEDTIME *BRAND ONLY DO NOT CHANGE.*  . NEXIUM 40 MG capsule Take 1 capsule in morning by mouth  . Omega-3 Fatty Acids (FISH OIL) 1000 MG CAPS Take 1 capsule by mouth daily.  . potassium chloride SA  (K-DUR,KLOR-CON) 20 MEQ tablet Take 20 mEq by mouth daily.  . prochlorperazine (COMPAZINE) 5 MG tablet Take 5 mg by mouth every 6 (six) hours as needed for nausea.  . Propylene Glycol (SYSTANE BALANCE) 0.6 % SOLN Apply 1 drop to eye 2 (two) times daily.  Marland Kitchen pyridOXINE (VITAMIN B-6) 100 MG tablet Take 100 mg by mouth daily.  . traMADol (ULTRAM) 50 MG tablet Take 50 mg by mouth every 6 (six) hours as needed for severe pain.  . vitamin B-12 (CYANOCOBALAMIN) 1000 MCG tablet Take 1,000 mcg by mouth daily.  . Vitamin D, Ergocalciferol, (DRISDOL) 50000 UNITS CAPS capsule Take 50,000 Units by mouth 2 (two) times a week.  . vitamin E 400 UNIT capsule Take 400 Units by mouth daily.  Marland Kitchen ZOCOR 20 MG tablet Take 20 mg by mouth daily.   No current facility-administered medications on file prior to visit.     Chief Complaint  Patient presents with  . Follow-up    Pt states that she quit using the CPAP about 1 month ago and is only using the O2. Pt states that she was getting water in the tubing and mask. DME: [facility]. ; Pt states that her breathing has been doing well. States the she feels that she benefits better from the O2 at night than she was the CPAP machine. Using nebulizer meds as directed. Would like flu vaccine  today if able.     Pulmonary tests RAST 04/20/11>>IgE 63.2, dust mites, cat/dog dander, elm, plantain 10/18/11 ENT evaluation negative [name]) PFT 11/29/11>>FEV1 0.97 (48%), FEV1% 67, TLC 2.82 (59%), DLCO 46%, +BD from FEF 25-75%.  Cardiac tests Echo 07/27/12 >> mod LVH, EF 65%, mild AS, mild MR, mod LA dilation, PAS 61 mmHg  Sleep tests PSG 09/05/12 >> AHI 51.9, SpO2 low 81%. CPAP 09/24/12 to 10/23/12 >> used on 20 of 30 nights with average 2.8 hours. Average AHI 2.2 with median CPAP 7 cm H2O and 95 th percentile CPAP 10 cm H2O. ONO with CPAP and RA 10/09/12 >> Test time 3 hrs 33 min. Mean SpO2 92%, low SpO2 82%. Spent 33 min with SpO2 < 89%. ONO with CPAP and 2 liters 11/20/12 >>  Test time 3 hrs 4 min. Mean SpO2 94.3%, low SpO2 77% (?artifact). Spent 10 min 32 sec with SpO2 < 89%.  Past medical history Depression, DM, GERD, PUD, HTN, HLD, Valgus Rt foot  Past surgical history, Family history, Social history, Allergies  Vital signs BP (!) 160/94 (BP Location: Left Arm, Cuff Size: Normal)   Pulse 73   Ht _0  (1.626 m)   Wt 234 lb (106.1 kg)   SpO2 100%   BMI 40.17 kg/m    History of Present Illness: [name] is a 75 y.o. female former smoker with chronic cough from asthma, allergic rhinitis with post-nasal drip, and severe OSA on auto CPAP and 2.5 liters qhs.  Her cough and breathing have been stable.  She is not getting sputum or wheezing.  Uses nebulizer 3 times per day.  She stopped using CPAP.  Was getting water build up in mask.  Feels like she sleeps better w/o CPAP.  She is using 2.5 liters oxygen at night.  Physical Exam:  General - No distress, obese  HEENT - Left eye pstosis, wears glasses, no sinus tenderness, no oral exudate, no LAN Cardiac - s1s2 regular, 2/6 systolic murmur  Chest - prolonged exhalation, no wheeze/rales Abdomen - Soft, nontender Extremities - 1+ leg edema, valgus deformities Neurologic - normal strength, difficulty with recall memory  Skin - chronic venous stasis changes  Psychiatric - pleasant, normal mood/behavior   Assessment/Plan:  COPD with asthma. - continue budesonide and singulair, prn duoneb - flu shot today  Obstructive sleep apnea. - discussed options to prevent rain out - advised her to resume auto CPAP, and explained how CPAP can help with her breathing at night  Obesity hypoventilation syndrome. - 2.5 liters oxygen at night  Upper airway cough syndrome related to allergies and rhinitis with post nasal drip. - improved - continue flonase, singulair   Patient Instructions  High dose flu shot today  Follow up in 6 months   Nicole Mires, MD [place] 11/19/2015,  11:44 AM Pager:  [phone]

## 2016-03-08 ENCOUNTER — Ambulatory Visit: Payer: Medicare Other | Admitting: Podiatry

## 2016-03-10 ENCOUNTER — Ambulatory Visit (INDEPENDENT_AMBULATORY_CARE_PROVIDER_SITE_OTHER): Payer: Medicare Other | Admitting: Orthopedic Surgery

## 2016-03-10 ENCOUNTER — Encounter (INDEPENDENT_AMBULATORY_CARE_PROVIDER_SITE_OTHER): Payer: Self-pay | Admitting: Orthopedic Surgery

## 2016-03-10 VITALS — Ht 64.0 in | Wt 234.0 lb

## 2016-03-10 DIAGNOSIS — I87323 Chronic venous hypertension (idiopathic) with inflammation of bilateral lower extremity: Secondary | ICD-10-CM | POA: Diagnosis not present

## 2016-03-10 DIAGNOSIS — B351 Tinea unguium: Secondary | ICD-10-CM | POA: Diagnosis not present

## 2016-03-10 NOTE — Progress Notes (Signed)
Office Visit Note   Patient: [name]           Date of Birth: 02-Sep-1940           MRN: [id number] Visit Date: 03/10/2016              Requested by: Burnard Bunting, MD [address] [place], [place] [address] PCP: Geoffery Lyons, MD  Chief Complaint  Patient presents with  . Left Foot - Nail Problem  . Right Foot - Nail Problem    HPI: Patient is here today for a bilateral nail trim. Pamella Pert, RMA    Assessment & Plan: Visit Diagnoses:  1. Onychomycosis   2. Idiopathic chronic venous hypertension of both lower extremities with inflammation     Plan: Nails trimmed 10 ulcer debridement 1 right foot. Continue with her medical shoes custom orthotics follow-up in 3 months  Follow-Up Instructions: Return in about 3 months (around 06/10/2016).   Ortho Exam Examination patient is alert oriented no adenopathy well-dressed normal affect she has shortness of breath while being seated. Examination patient has an antalgic gait she has difficulty getting from a seated to a standing position. She has brawny skin color changes of the massive swelling in both legs but no open ulcers no cellulitis no drainage. Examination of her feet she has thick and discolored onychomycotic nails 10 she is unable to safely trim nails on her own and the nails are trimmed 10 without complications. She has a Wagner grade 1 ulcer beneath the rocker-bottom deformity of the right foot. After informed consent a 10 blade knife was used to debride the skin and soft tissue back to healthy viable tissue. The area is 1 cm in diameter and 3 mm deep.  Imaging: No results found.  Labs: Lab Results  Component Value Date   HGBA1C 6.1 (H) 02/04/2013   HGBA1C 6.0 (H) 08/13/2012   HGBA1C  10/11/2006    (NOTE) Test Procedure               Results       Units       Normal Range Hemoglobin A1c w Mean Plasm Hemoglobin A1C               TNP           %            4.6-6.1               R  (performed at MAIN)  Unable to determine HgbA1C using standard method  due to interference of probable abnormal hemoglobin. Suggest Glycohemoglobin, Total (80088).    Orders:  No orders of the defined types were placed in this encounter.  No orders of the defined types were placed in this encounter.    Procedures: No procedures performed  Clinical Data: No additional findings.  Subjective: Review of Systems  Objective: Vital Signs: Ht 5\' 4"  (1.626 m)   Wt 234 lb (106.1 kg)   BMI 40.17 kg/m   Specialty Comments:  No specialty comments available.  PMFS History: Patient Active Problem List   Diagnosis Date Noted  . Idiopathic chronic venous hypertension of both lower extremities with inflammation 03/10/2016  . Onychomycosis 03/10/2016  . COPD exacerbation ([place]) 11/07/2013  . Upper airway cough syndrome 11/07/2013  . Insomnia 12/10/2012  . OSA (obstructive sleep apnea) 09/17/2012  . Benign neoplasm of colon 08/14/2012  . Rectal bleeding 08/13/2012    Class: Acute  . Anemia 08/13/2012  Class: Acute  . Osteoarthritis 08/13/2012    Class: Chronic  . Acute posthemorrhagic anemia 08/13/2012  . Diverticulosis of colon (without mention of hemorrhage) 08/13/2012  . Hoarseness 07/20/2011  . COPD with asthma ([place]) 04/20/2011  . Allergic rhinitis 04/20/2011  . GI bleed 02/08/2011  . Personal history of colonic polyps 04/16/2010  . Fecal incontinence 03/23/2010  . Change in bowel habits 03/23/2010   Past Medical History:  Diagnosis Date  . Adenomatous colon polyp   . Anemia   . Arthritis   . Asthma   . COPD with asthma ([place]) 04/20/2011  . Depression   . Diabetes mellitus   . Diverticulosis   . Gastric ulcer   . GERD (gastroesophageal reflux disease)   . HTN (hypertension)   . Hypercholesterolemia   . Internal hemorrhoids   . Lumbar disc disease   . OSA (obstructive sleep apnea) 09/17/2012  . Peptic ulcer disease   . 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 Umbilical hernia     . Valgus foot    right     Family History  Problem Relation Age of Onset  . Breast cancer Maternal Aunt     Past Surgical History:  Procedure Laterality Date  . COLONOSCOPY  2209 or 2010    2001, 2005, 2009  colonoscopy.  [place]  . COLONOSCOPY N/A 08/14/2012   Procedure: COLONOSCOPY;  Surgeon: Ladene Artist, MD;  Location: [facility] ENDOSCOPY;  Service: Endoscopy;  Laterality: N/A;  . INCISE AND DRAIN ABCESS     Abdominal Wall  . LIPOMA RESECTION     Right Neck  . TOTAL KNEE ARTHROPLASTY     Bilateral  . UMBILICAL HERNIA REPAIR  2007   incarcerated  . UPPER GASTROINTESTINAL ENDOSCOPY  2001   medoff Grade A esophagitis   Social History   Occupational History  . Retired     Astronomer   Social History Main Topics  . Smoking status: Former Smoker    Packs/day: 2.00    Years: 5.00    Types: Cigarettes    Quit date: 01/10/1958  . Smokeless tobacco: Never Used  . Alcohol use No     Comment: Quit when she quit smoking  . Drug use: No  . Sexual activity: Not on file

## 2016-03-20 ENCOUNTER — Other Ambulatory Visit: Payer: Self-pay | Admitting: Pulmonary Disease

## 2016-04-06 ENCOUNTER — Ambulatory Visit: Payer: Medicare Other | Admitting: Podiatry

## 2016-05-17 ENCOUNTER — Other Ambulatory Visit: Payer: Self-pay | Admitting: Internal Medicine

## 2016-05-17 DIAGNOSIS — Z1231 Encounter for screening mammogram for malignant neoplasm of breast: Secondary | ICD-10-CM

## 2016-06-08 ENCOUNTER — Ambulatory Visit
Admission: RE | Admit: 2016-06-08 | Discharge: 2016-06-08 | Disposition: A | Discharge status: Home / Self Care | Payer: Medicare Other | Source: Ambulatory Visit | Attending: Internal Medicine | Admitting: Internal Medicine

## 2016-06-08 DIAGNOSIS — Z1231 Encounter for screening mammogram for malignant neoplasm of breast: Secondary | ICD-10-CM

## 2016-06-13 ENCOUNTER — Ambulatory Visit (INDEPENDENT_AMBULATORY_CARE_PROVIDER_SITE_OTHER): Payer: Medicare Other | Admitting: Orthopedic Surgery

## 2016-06-13 ENCOUNTER — Ambulatory Visit (INDEPENDENT_AMBULATORY_CARE_PROVIDER_SITE_OTHER): Payer: Medicare Other | Admitting: Family

## 2016-06-13 DIAGNOSIS — I87323 Chronic venous hypertension (idiopathic) with inflammation of bilateral lower extremity: Secondary | ICD-10-CM

## 2016-06-13 DIAGNOSIS — B351 Tinea unguium: Secondary | ICD-10-CM

## 2016-06-13 DIAGNOSIS — L97421 Non-pressure chronic ulcer of left heel and midfoot limited to breakdown of skin: Secondary | ICD-10-CM | POA: Diagnosis not present

## 2016-06-13 NOTE — Progress Notes (Signed)
Office Visit Note   Patient: [name]           Date of Birth: 02/22/1940           MRN: [id number] Visit Date: 06/13/2016              Requested by: Burnard Bunting, MD [address] [place], [place] [address] PCP: Burnard Bunting, MD  No chief complaint on file.   HPI: The patient is 76 year old woman who presents today in follow-up. She has onychomycotic nails that she is unable to safely trim individually. She also is seen in follow-up for chronic venous swelling of her bilateral lower extremities. Is in more now its TED hose today. States has no ulcerations today. Does have a plantar ulceration on her right foot that needs evaluation as well as trimming. The patient is short of breath at rest. Ambulates with a rolling walker.     Assessment & Plan: Visit Diagnoses:  1. Idiopathic chronic venous hypertension of both lower extremities with inflammation   2. Onychomycosis   3. Midfoot ulcer, left, limited to breakdown of skin ([place])     Plan: Nails trimmed 10 ulcer debridement 1 right foot. Continue with her medical shoes custom orthotics follow-up in 3 months.  Follow-Up Instructions: Return in about 3 months (around 09/13/2016).   Ortho Exam Examination patient is alert oriented no adenopathy well-dressed normal affect she has shortness of breath while being seated. Examination patient has an antalgic gait she has difficulty getting from a seated to a standing position. She has brawny skin color changes of the massive swelling in both legs but no open ulcers no cellulitis no drainage. Examination of her feet she has thick and discolored onychomycotic nails 10 she is unable to safely trim nails on her own and the nails are trimmed 10 without complications. She has a Wagner grade 1 ulcer beneath the rocker-bottom deformity of the right foot. After informed consent a 10 blade knife was used to debride the skin and soft tissue back to healthy viable tissue. The area is 2 cm  in diameter and 3 mm deep.  Imaging: No results found.  Labs: Lab Results  Component Value Date   HGBA1C 6.1 (H) 02/04/2013   HGBA1C 6.0 (H) 08/13/2012   HGBA1C  10/11/2006    (NOTE) Test Procedure               Results       Units       Normal Range Hemoglobin A1c w Mean Plasm Hemoglobin A1C               TNP           %            4.6-6.1               R  (performed at MAIN) Unable to determine HgbA1C using standard method  due to interference of probable abnormal hemoglobin. Suggest Glycohemoglobin, Total (80088).    Orders:  No orders of the defined types were placed in this encounter.  No orders of the defined types were placed in this encounter.    Procedures: No procedures performed  Clinical Data: No additional findings.  Subjective: Review of Systems  Constitutional: Negative for chills and fever.  Cardiovascular: Positive for leg swelling.  Skin: Positive for rash. Negative for color change and wound.    Objective: Vital Signs: There were no vitals taken for this visit.  Specialty Comments:  No specialty comments available.  PMFS History: Patient Active Problem List   Diagnosis Date Noted  . Midfoot ulcer, left, limited to breakdown of skin ([place]) 06/13/2016  . Idiopathic chronic venous hypertension of both lower extremities with inflammation 03/10/2016  . Onychomycosis 03/10/2016  . COPD exacerbation ([place]) 11/07/2013  . Upper airway cough syndrome 11/07/2013  . Insomnia 12/10/2012  . OSA (obstructive sleep apnea) 09/17/2012  . Benign neoplasm of colon 08/14/2012  . Rectal bleeding 08/13/2012    Class: Acute  . Anemia 08/13/2012    Class: Acute  . Osteoarthritis 08/13/2012    Class: Chronic  . Acute posthemorrhagic anemia 08/13/2012  . Diverticulosis of colon (without mention of hemorrhage) 08/13/2012  . Hoarseness 07/20/2011  . 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 Allergic rhinitis 04/20/2011  . GI bleed 02/08/2011  . Personal history of  colonic polyps 04/16/2010  . Fecal incontinence 03/23/2010  . Change in bowel habits 03/23/2010   Past Medical History:  Diagnosis Date  . Adenomatous colon polyp   . Anemia   . Arthritis   . Asthma   . 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 Depression   . Diabetes mellitus   . Diverticulosis   . Gastric ulcer   . GERD (gastroesophageal reflux disease)   . HTN (hypertension)   . Hypercholesterolemia   . Internal hemorrhoids   . Lumbar disc disease   . OSA (obstructive sleep apnea) 09/17/2012  . Peptic ulcer disease   . 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 Umbilical hernia   . Valgus foot    right     Family History  Problem Relation Age of Onset  . Breast cancer Maternal Aunt     Past Surgical History:  Procedure Laterality Date  . COLONOSCOPY  2209 or 2010    2001, 2005, 2009  colonoscopy.  [place]  . COLONOSCOPY N/A 08/14/2012   Procedure: COLONOSCOPY;  Surgeon: Ladene Artist, MD;  Location: [facility] ENDOSCOPY;  Service: Endoscopy;  Laterality: N/A;  . INCISE AND DRAIN ABCESS     Abdominal Wall  . LIPOMA RESECTION     Right Neck  . TOTAL KNEE ARTHROPLASTY     Bilateral  . UMBILICAL HERNIA REPAIR  2007   incarcerated  . UPPER GASTROINTESTINAL ENDOSCOPY  2001   medoff Grade A esophagitis   Social History   Occupational History  . Retired     Astronomer   Social History Main Topics  . Smoking status: Former Smoker    Packs/day: 2.00    Years: 5.00    Types: Cigarettes    Quit date: 01/10/1958  . Smokeless tobacco: Never Used  . Alcohol use No     Comment: Quit when she quit smoking  . Drug use: No  . Sexual activity: Not on file

## 2016-06-29 ENCOUNTER — Ambulatory Visit: Payer: Medicare Other | Admitting: Pulmonary Disease

## 2016-09-08 ENCOUNTER — Ambulatory Visit (INDEPENDENT_AMBULATORY_CARE_PROVIDER_SITE_OTHER): Payer: Medicare Other | Admitting: Family

## 2016-09-08 ENCOUNTER — Encounter (INDEPENDENT_AMBULATORY_CARE_PROVIDER_SITE_OTHER): Payer: Self-pay | Admitting: Family

## 2016-09-08 DIAGNOSIS — L97421 Non-pressure chronic ulcer of left heel and midfoot limited to breakdown of skin: Secondary | ICD-10-CM

## 2016-09-08 DIAGNOSIS — B351 Tinea unguium: Secondary | ICD-10-CM

## 2016-09-08 NOTE — Progress Notes (Signed)
Office Visit Note   Patient: [name]           Date of Birth: February 23, 1940           MRN: [id number] Visit Date: 09/08/2016              Requested by: Burnard Bunting, MD [address] [place],  [address] PCP: Burnard Bunting, MD  Chief Complaint  Patient presents with  . Left Foot - Follow-up  . Right Foot - Follow-up    HPI: The patient is 76 year old woman who presents today in follow-up. She has onychomycotic nails that she is unable to safely trim individually. She also is seen in follow-up for chronic venous swelling of her bilateral lower extremities. Is in compression hose today. States has no ulcerations today to legs. Does have a plantar ulceration on her right foot that needs evaluation as well as trimming. Ambulates with a rolling walker.     Assessment & Plan: Visit Diagnoses:  1. Midfoot ulcer, left, limited to breakdown of skin ([place])   2. Onychomycosis     Plan: Nails trimmed 10; ulcer debridement 1 right foot. Continue with her medical shoes custom orthotics follow-up in 3 months.  Follow-Up Instructions: Return in about 3 months (around 12/09/2016).   Ortho Exam Examination patient is alert oriented no adenopathy well-dressed normal affect. Examination patient has an antalgic gait she has difficulty getting from a seated to a standing position. She has brawny skin color changes of the massive swelling in both legs but no open ulcers no cellulitis no drainage. Examination of her feet she has thickened and discolored onychomycotic nails 10 she is unable to safely trim nails on her own and the nails are trimmed 10 without complications. She has a Wagner grade 1 ulcer beneath the rocker-bottom deformity of the right foot. After informed consent a 10 blade knife was used to debride the skin and soft tissue back to healthy viable tissue. The callus is 2 cm in diameter and 3 mm deep.  Imaging: No results found.  Labs: Lab Results  Component Value  Date   HGBA1C 6.1 (H) 02/04/2013   HGBA1C 6.0 (H) 08/13/2012   HGBA1C  10/11/2006    (NOTE) Test Procedure               Results       Units       Normal Range Hemoglobin A1c w Mean Plasm Hemoglobin A1C               TNP           %            4.6-6.1               R  (performed at MAIN) Unable to determine HgbA1C using standard method  due to interference of probable abnormal hemoglobin. Suggest Glycohemoglobin, Total (80088).    Orders:  No orders of the defined types were placed in this encounter.  No orders of the defined types were placed in this encounter.    Procedures: No procedures performed  Clinical Data: No additional findings.  Subjective: Review of Systems  Constitutional: Negative for chills and fever.  Cardiovascular: Positive for leg swelling.  Skin: Positive for rash. Negative for color change and wound.    Objective: Vital Signs: There were no vitals taken for this visit.  Specialty Comments:  No specialty comments available.  PMFS History: Patient Active Problem List   Diagnosis Date  Noted  . Midfoot ulcer, left, limited to breakdown of skin ([place]) 06/13/2016  . Idiopathic chronic venous hypertension of both lower extremities with inflammation 03/10/2016  . Onychomycosis 03/10/2016  . COPD exacerbation ([place]) 11/07/2013  . Upper airway cough syndrome 11/07/2013  . Insomnia 12/10/2012  . OSA (obstructive sleep apnea) 09/17/2012  . Benign neoplasm of colon 08/14/2012  . Rectal bleeding 08/13/2012    Class: Acute  . Anemia 08/13/2012    Class: Acute  . Osteoarthritis 08/13/2012    Class: Chronic  . Acute posthemorrhagic anemia 08/13/2012  . Diverticulosis of colon (without mention of hemorrhage) 08/13/2012  . Hoarseness 07/20/2011  . 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 Allergic rhinitis 04/20/2011  . GI bleed 02/08/2011  . Personal history of colonic polyps 04/16/2010  . Fecal incontinence 03/23/2010  . Change in bowel habits 03/23/2010    Past Medical History:  Diagnosis Date  . Adenomatous colon polyp   . Anemia   . Arthritis   . Asthma   . COPD with asthma ([place]) 04/20/2011  . Depression   . Diabetes mellitus   . Diverticulosis   . Gastric ulcer   . GERD (gastroesophageal reflux disease)   . HTN (hypertension)   . Hypercholesterolemia   . Internal hemorrhoids   . Lumbar disc disease   . OSA (obstructive sleep apnea) 09/17/2012  . Peptic ulcer disease   . 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 Umbilical hernia   . Valgus foot    right     Family History  Problem Relation Age of Onset  . Breast cancer Maternal Aunt     Past Surgical History:  Procedure Laterality Date  . COLONOSCOPY  2209 or 2010    2001, 2005, 2009  colonoscopy.  [place]  . COLONOSCOPY N/A 08/14/2012   Procedure: COLONOSCOPY;  Surgeon: Ladene Artist, MD;  [facility] END[facility]COPY;  Service: Endoscopy;  Laterality: N/A;  . INCISE AND DRAIN ABCESS     Abdominal Wall  . LIPOMA RESECTION     Right Neck  . TOTAL KNEE ARTHROPLASTY     Bilateral  . UMBILICAL HERNIA REPAIR  2007   incarcerated  . UPPER GASTROINTESTINAL ENDOSCOPY  2001   medoff Grade A esophagitis   Social History   Occupational History  . Retired     Astronomer   Social History Main Topics  . Smoking status: Former Smoker    Packs/day: 2.00    Years: 5.00    Types: Cigarettes    Quit date: 01/10/1958  . Smokeless tobacco: Never Used  . Alcohol use No     Comment: Quit when she quit smoking  . Drug use: No  . Sexual activity: Not on file

## 2016-10-05 ENCOUNTER — Telehealth: Payer: Self-pay | Admitting: Pulmonary Disease

## 2016-10-05 ENCOUNTER — Ambulatory Visit (INDEPENDENT_AMBULATORY_CARE_PROVIDER_SITE_OTHER): Payer: Medicare Other | Admitting: Pulmonary Disease

## 2016-10-05 ENCOUNTER — Encounter: Payer: Self-pay | Admitting: Pulmonary Disease

## 2016-10-05 VITALS — BP 138/78 | HR 83 | Ht 61.0 in | Wt 226.8 lb

## 2016-10-05 DIAGNOSIS — J449 Chronic obstructive pulmonary disease, unspecified: Secondary | ICD-10-CM | POA: Diagnosis not present

## 2016-10-05 DIAGNOSIS — J9611 Chronic respiratory failure with hypoxia: Secondary | ICD-10-CM

## 2016-10-05 DIAGNOSIS — R05 Cough: Secondary | ICD-10-CM

## 2016-10-05 DIAGNOSIS — R058 Other specified cough: Secondary | ICD-10-CM

## 2016-10-05 MED ORDER — IPRATROPIUM-ALBUTEROL 0.5-2.5 (3) MG/3ML IN SOLN: 3.0000 mL | Freq: Four times a day (QID) | RESPIRATORY_TRACT | 11 refills | Status: DC | PRN

## 2016-10-05 MED ORDER — MONTELUKAST SODIUM 10 MG PO TABS: 10.0000 mg | ORAL_TABLET | Freq: Every day | ORAL | 5 refills | Status: DC

## 2016-10-05 MED ORDER — BUDESONIDE 0.25 MG/2ML IN SUSP: 0.2500 mg | Freq: Two times a day (BID) | RESPIRATORY_TRACT | 12 refills | Status: DC

## 2016-10-05 NOTE — Telephone Encounter (Signed)
If that is what is required, then we will have to bring her in.

## 2016-10-05 NOTE — Progress Notes (Signed)
Current Outpatient Prescriptions on File Prior to Visit  Medication Sig  . ACCU-CHEK AVIVA PLUS test strip   . Alcohol Swabs (ALCOHOL PREP) 70 % PADS   . amLODipine (NORVASC) 5 MG tablet Take 10 mg by mouth daily.   . ASSURE COMFORT LANCETS 30G MISC   . augmented betamethasone dipropionate (DIPROLENE-AF) 0.05 % cream Apply 1 application topically 2 (two) times daily. To both legs  . Blood Glucose Calibration (ACCU-CHEK AVIVA) SOLN   . Blood Glucose Monitoring Suppl (ACCU-CHEK AVIVA PLUS) W/DEVICE KIT   . Calcium Carbonate-Vitamin D (CALTRATE 600+D) 600-400 MG-UNIT per tablet Take 1 tablet by mouth daily.  . fluticasone (FLONASE) 50 MCG/ACT nasal spray Place 2 sprays into both nostrils daily.  . folic acid (FOLVITE) 320 MCG tablet Take 400 mcg by mouth daily.  Marland Kitchen glipiZIDE (GLUCOTROL XL) 10 MG 24 hr tablet Take 10 mg by mouth daily.   Marland Kitchen HUMALOG MIX 75/25 KWIKPEN (75-25) 100 UNIT/ML SUSP Inject 15 Units into the skin at bedtime.   . insulin glargine (LANTUS) 100 UNIT/ML injection Inject 30 Units into the skin daily.   . iron polysaccharides (NIFEREX) 150 MG capsule Take 150 mg by mouth 2 (two) times daily.  El[place] Guise Devices (ADJUSTABLE LANCING DEVICE) MISC   . LASIX 40 MG tablet Take 80 mg by mouth daily.  Marland Kitchen losartan (COZAAR) 100 MG tablet   . magnesium oxide (MAG-OX) 400 MG tablet Take 400 mg by mouth daily.  . methocarbamol (ROBAXIN) 500 MG tablet Take 500 mg by mouth 3 (three) times daily as needed.  Marland Kitchen NEXIUM 40 MG capsule Take 1 capsule in morning by mouth  . Omega-3 Fatty Acids (FISH OIL) 1000 MG CAPS Take 1 capsule by mouth daily.  . potassium chloride SA (K-DUR,KLOR-CON) 20 MEQ tablet Take 20 mEq by mouth daily.  . prochlorperazine (COMPAZINE) 5 MG tablet Take 5 mg by mouth every 6 (six) hours as needed for nausea.  . Propylene Glycol (SYSTANE BALANCE) 0.6 % SOLN Apply 1 drop to eye 2 (two) times daily.  Marland Kitchen pyridOXINE (VITAMIN B-6) 100 MG tablet Take 100 mg by mouth daily.  . traMADol  (ULTRAM) 50 MG tablet Take 50 mg by mouth every 6 (six) hours as needed for severe pain.  . vitamin B-12 (CYANOCOBALAMIN) 1000 MCG tablet Take 1,000 mcg by mouth daily.  . Vitamin D, Ergocalciferol, (DRISDOL) 50000 UNITS CAPS capsule Take 50,000 Units by mouth 2 (two) times a week.  . vitamin E 400 UNIT capsule Take 400 Units by mouth daily.  Marland Kitchen ZOCOR 20 MG tablet Take 20 mg by mouth daily.   No current facility-administered medications on file prior to visit.     Chief Complaint  Patient presents with  . Follow-up    Pt was SOB from weight station used wheelchair to bring back to the exam room. At rest pt o2 was 89% once sitting breathing normal went up to 92%. Pt is wheezing more often last three weeks, and when speaking with her she is winded.  Pt SOB more often than usual. Pt has CPAP machine, doesn't use often. DME-[facility]    Pulmonary tests RAST 04/20/11>>IgE 63.2, dust mites, cat/dog dander, elm, plantain 10/18/11 ENT evaluation negative [place]) PFT 11/29/11>>FEV1 0.97 (48%), FEV1% 67, TLC 2.82 (59%), DLCO 46%, +BD from FEF 25-75%.  Cardiac tests Echo 07/27/12 >> mod LVH, EF 65%, mild AS, mild MR, mod LA dilation, PAS 61 mmHg  Sleep tests PSG 09/05/12 >> AHI 51.9, SpO2 low 81%. CPAP 09/24/12  to 10/23/12 >> used on 20 of 30 nights with average 2.8 hours. Average AHI 2.2 with median CPAP 7 cm H2O and 95 th percentile CPAP 10 cm H2O. ONO with CPAP and RA 10/09/12 >> Test time 3 hrs 33 min. Mean SpO2 92%, low SpO2 82%. Spent 33 min with SpO2 < 89%. ONO with CPAP and 2 liters 11/20/12 >> Test time 3 hrs 4 min. Mean SpO2 94.3%, low SpO2 77% (?artifact). Spent 10 min 32 sec with SpO2 < 89%.  Past medical history Depression, DM, GERD, PUD, HTN, HLD, Valgus Rt foot  Past surgical history, Family history, Social history, Allergies  Vital signs BP 138/78 (BP Location: Left Arm, Cuff Size: Normal)   Pulse 83   Ht _0  (1.549 m)   Wt 226 lb 12.8 oz (102.9 kg)   SpO2 92%   BMI  42.85 kg/m    History of Present Illness: [name] is a 76 y.o. female former smoker with chronic cough from asthma, allergic rhinitis with post-nasal drip, and severe OSA on auto CPAP and 2.5 liters qhs.  She doesn't like using CPAP.  She feels supplemental oxygen works better for her.  She has trouble using oxygen when she goes out >> tanks are too heavy.  She gets confused about her medications.  She thinks she is taking one medication twice per day in nebulizer, and the other is several times per day.  She has intermittent cough and clear sputum.  She has been getting intermittent wheezing.  She is not having chest pain, fever, or sinus congestion.  Physical Exam:  General - pleasant Eyes - Lt eye ptosis, wears glasses ENT - no sinus tenderness, no oral exudate, no LAN Cardiac - regular, 2/6 systolic murmur Chest - no wheeze, rales Abd - soft, non tender Ext - 2+ non pitting edema Skin - chronic venous stasis changes Neuro - normal strength Psych - normal mood   Assessment/Plan:  COPD with asthma. - I had an extensive discussion with her about different roles of her nebulizer medications and when to use them - continue pulmicort bid, and duoneb every 4 to 6 hours as needed - continue singulair  Obstructive sleep apnea. - she has trouble tolerating CPAP  Chronic respiratory failure with hypoxia from COPD and obesity hypoventilation syndrome. - will try to arrange for POC - continue 2.5 liters during the day - will arrange for ONO with 2.5 liters at night and determine if she needs her nocturnal oxygen adjusted  Upper airway cough syndrome related to allergies and rhinitis with post nasal drip. - continue singulair, and flonase   Patient Instructions  Will arrange for overnight oxygen test with you using 2.5 liters oxygen  Will see if your home care company can arrange for more portable oxygen system  Pulmicort (budesonide) 1 vial in nebulizer in the morning  and evening  Albuterol and Ipratropium 1 vial in nebulizer every 4 to 6 hours as needed  Montelukast (singulair) 10 mg pill nightly  Follow up in 6 months   Time spent 28 minutes  Chesley Mires, MD [place] 10/05/2016, 12:45 PM Pager:  [phone]

## 2016-10-05 NOTE — Telephone Encounter (Signed)
VS--[name] from [facility] stated that they can do the evaluation for the POC but they will need new testing to qualify for portability---pt will need to come in for a walk.  Please advise. Thanks

## 2016-10-05 NOTE — Patient Instructions (Signed)
Will arrange for overnight oxygen test with you using 2.5 liters oxygen  Will see if your home care company can arrange for more portable oxygen system  Pulmicort (budesonide) 1 vial in nebulizer in the morning and evening  Albuterol and Ipratropium 1 vial in nebulizer every 4 to 6 hours as needed  Montelukast (singulair) 10 mg pill nightly  Follow up in 6 months

## 2016-10-06 NOTE — Telephone Encounter (Signed)
lmtcb X1 for pt to schedule O2 titration walk 59mw schedule.

## 2016-10-06 NOTE — Telephone Encounter (Signed)
lmtcb x2 for pt. 

## 2016-10-10 NOTE — Telephone Encounter (Signed)
Called and spoke to pt. Informed her that we need her to come in for a walk test to qualify pt for O2. Pt states she can do this but will need to find transportation. She states she will call us back when she schedule the walk. Will keep message open to f/u to be sure walk is scheduled.

## 2016-10-10 NOTE — Telephone Encounter (Signed)
Pt was scheduled for 10/12/2016 at 11:30.Marland KitchenMarland Kitchen

## 2016-10-12 ENCOUNTER — Ambulatory Visit: Payer: Medicare Other

## 2016-10-12 ENCOUNTER — Ambulatory Visit (INDEPENDENT_AMBULATORY_CARE_PROVIDER_SITE_OTHER): Payer: Medicare Other | Admitting: *Deleted

## 2016-10-12 DIAGNOSIS — J449 Chronic obstructive pulmonary disease, unspecified: Secondary | ICD-10-CM | POA: Diagnosis not present

## 2016-10-28 ENCOUNTER — Encounter: Payer: Self-pay | Admitting: Pulmonary Disease

## 2016-11-01 ENCOUNTER — Encounter: Payer: Self-pay | Admitting: Pulmonary Disease

## 2016-11-22 ENCOUNTER — Emergency Department (HOSPITAL_COMMUNITY): Payer: Medicare Other

## 2016-11-22 ENCOUNTER — Encounter (HOSPITAL_COMMUNITY): Payer: Self-pay | Admitting: Emergency Medicine

## 2016-11-22 ENCOUNTER — Inpatient Hospital Stay (HOSPITAL_COMMUNITY)
Admission: EM | Admit: 2016-11-22 | Discharge: 2016-12-02 | DRG: 291 | Disposition: A | Discharge status: Skilled Nursing Facility | Payer: Medicare Other | Attending: Family Medicine | Admitting: Family Medicine

## 2016-11-22 ENCOUNTER — Other Ambulatory Visit: Payer: Self-pay

## 2016-11-22 DIAGNOSIS — E1122 Type 2 diabetes mellitus with diabetic chronic kidney disease: Secondary | ICD-10-CM | POA: Diagnosis present

## 2016-11-22 DIAGNOSIS — D649 Anemia, unspecified: Secondary | ICD-10-CM

## 2016-11-22 DIAGNOSIS — E669 Obesity, unspecified: Secondary | ICD-10-CM | POA: Diagnosis present

## 2016-11-22 DIAGNOSIS — Z85528 Personal history of other malignant neoplasm of kidney: Secondary | ICD-10-CM

## 2016-11-22 DIAGNOSIS — D631 Anemia in chronic kidney disease: Secondary | ICD-10-CM | POA: Diagnosis present

## 2016-11-22 DIAGNOSIS — N179 Acute kidney failure, unspecified: Secondary | ICD-10-CM | POA: Clinically undetermined

## 2016-11-22 DIAGNOSIS — J9621 Acute and chronic respiratory failure with hypoxia: Secondary | ICD-10-CM | POA: Diagnosis not present

## 2016-11-22 DIAGNOSIS — J441 Chronic obstructive pulmonary disease with (acute) exacerbation: Secondary | ICD-10-CM | POA: Diagnosis not present

## 2016-11-22 DIAGNOSIS — K5900 Constipation, unspecified: Secondary | ICD-10-CM | POA: Diagnosis not present

## 2016-11-22 DIAGNOSIS — E785 Hyperlipidemia, unspecified: Secondary | ICD-10-CM | POA: Diagnosis present

## 2016-11-22 DIAGNOSIS — F329 Major depressive disorder, single episode, unspecified: Secondary | ICD-10-CM | POA: Diagnosis present

## 2016-11-22 DIAGNOSIS — D638 Anemia in other chronic diseases classified elsewhere: Secondary | ICD-10-CM | POA: Diagnosis present

## 2016-11-22 DIAGNOSIS — I5033 Acute on chronic diastolic (congestive) heart failure: Secondary | ICD-10-CM | POA: Diagnosis not present

## 2016-11-22 DIAGNOSIS — N271 Small kidney, bilateral: Secondary | ICD-10-CM | POA: Diagnosis present

## 2016-11-22 DIAGNOSIS — G4700 Insomnia, unspecified: Secondary | ICD-10-CM | POA: Diagnosis not present

## 2016-11-22 DIAGNOSIS — R05 Cough: Secondary | ICD-10-CM

## 2016-11-22 DIAGNOSIS — R059 Cough, unspecified: Secondary | ICD-10-CM

## 2016-11-22 DIAGNOSIS — J309 Allergic rhinitis, unspecified: Secondary | ICD-10-CM | POA: Diagnosis present

## 2016-11-22 DIAGNOSIS — Z6837 Body mass index (BMI) 37.0-37.9, adult: Secondary | ICD-10-CM

## 2016-11-22 DIAGNOSIS — J4489 Other specified chronic obstructive pulmonary disease: Secondary | ICD-10-CM | POA: Diagnosis present

## 2016-11-22 DIAGNOSIS — Z79899 Other long term (current) drug therapy: Secondary | ICD-10-CM

## 2016-11-22 DIAGNOSIS — Z9981 Dependence on supplemental oxygen: Secondary | ICD-10-CM

## 2016-11-22 DIAGNOSIS — Z794 Long term (current) use of insulin: Secondary | ICD-10-CM

## 2016-11-22 DIAGNOSIS — I878 Other specified disorders of veins: Secondary | ICD-10-CM | POA: Diagnosis present

## 2016-11-22 DIAGNOSIS — Z7951 Long term (current) use of inhaled steroids: Secondary | ICD-10-CM

## 2016-11-22 DIAGNOSIS — I509 Heart failure, unspecified: Secondary | ICD-10-CM

## 2016-11-22 DIAGNOSIS — J449 Chronic obstructive pulmonary disease, unspecified: Secondary | ICD-10-CM | POA: Diagnosis present

## 2016-11-22 DIAGNOSIS — R0602 Shortness of breath: Secondary | ICD-10-CM

## 2016-11-22 DIAGNOSIS — I130 Hypertensive heart and chronic kidney disease with heart failure and stage 1 through stage 4 chronic kidney disease, or unspecified chronic kidney disease: Secondary | ICD-10-CM | POA: Diagnosis not present

## 2016-11-22 DIAGNOSIS — N2581 Secondary hyperparathyroidism of renal origin: Secondary | ICD-10-CM | POA: Diagnosis present

## 2016-11-22 DIAGNOSIS — Z23 Encounter for immunization: Secondary | ICD-10-CM

## 2016-11-22 DIAGNOSIS — Z8711 Personal history of peptic ulcer disease: Secondary | ICD-10-CM

## 2016-11-22 DIAGNOSIS — E872 Acidosis, unspecified: Secondary | ICD-10-CM | POA: Diagnosis present

## 2016-11-22 DIAGNOSIS — G4733 Obstructive sleep apnea (adult) (pediatric): Secondary | ICD-10-CM | POA: Diagnosis not present

## 2016-11-22 DIAGNOSIS — N183 Chronic kidney disease, stage 3 (moderate): Secondary | ICD-10-CM

## 2016-11-22 DIAGNOSIS — K210 Gastro-esophageal reflux disease with esophagitis: Secondary | ICD-10-CM | POA: Diagnosis present

## 2016-11-22 DIAGNOSIS — E875 Hyperkalemia: Secondary | ICD-10-CM | POA: Diagnosis present

## 2016-11-22 DIAGNOSIS — N184 Chronic kidney disease, stage 4 (severe): Secondary | ICD-10-CM | POA: Diagnosis present

## 2016-11-22 DIAGNOSIS — Z96653 Presence of artificial knee joint, bilateral: Secondary | ICD-10-CM | POA: Diagnosis present

## 2016-11-22 DIAGNOSIS — M1990 Unspecified osteoarthritis, unspecified site: Secondary | ICD-10-CM | POA: Diagnosis present

## 2016-11-22 DIAGNOSIS — Z87891 Personal history of nicotine dependence: Secondary | ICD-10-CM

## 2016-11-22 HISTORY — DX: Chronic obstructive pulmonary disease, unspecified: J44.9

## 2016-11-22 LAB — CBC WITH DIFFERENTIAL/PLATELET
BASOS ABS: 0 10*3/uL (ref 0.0–0.1)
Basophils Relative: 0 %
EOS PCT: 8 %
Eosinophils Absolute: 0.5 10*3/uL (ref 0.0–0.7)
HEMATOCRIT: 28 % — AB (ref 36.0–46.0)
Hemoglobin: 9.2 g/dL — ABNORMAL LOW (ref 12.0–15.0)
LYMPHS PCT: 14 %
Lymphs Abs: 0.9 10*3/uL (ref 0.7–4.0)
MCH: 30.4 pg (ref 26.0–34.0)
MCHC: 32.9 g/dL (ref 30.0–36.0)
MCV: 92.4 fL (ref 78.0–100.0)
MONO ABS: 0.4 10*3/uL (ref 0.1–1.0)
Monocytes Relative: 7 %
NEUTROS ABS: 4.4 10*3/uL (ref 1.7–7.7)
Neutrophils Relative %: 71 %
PLATELETS: 139 10*3/uL — AB (ref 150–400)
RBC: 3.03 MIL/uL — AB (ref 3.87–5.11)
RDW: 19.3 % — ABNORMAL HIGH (ref 11.5–15.5)
WBC: 6.2 10*3/uL (ref 4.0–10.5)

## 2016-11-22 LAB — URINALYSIS, ROUTINE W REFLEX MICROSCOPIC
Bilirubin Urine: NEGATIVE
Glucose, UA: NEGATIVE mg/dL
Hgb urine dipstick: NEGATIVE
KETONES UR: NEGATIVE mg/dL
Nitrite: NEGATIVE
PROTEIN: NEGATIVE mg/dL
Specific Gravity, Urine: 1.008 (ref 1.005–1.030)
pH: 5 (ref 5.0–8.0)

## 2016-11-22 LAB — INFLUENZA PANEL BY PCR (TYPE A & B)
Influenza A By PCR: NEGATIVE
Influenza B By PCR: NEGATIVE

## 2016-11-22 LAB — CBC
HCT: 52.7 % — ABNORMAL HIGH (ref 36.0–46.0)
Hemoglobin: 17.6 g/dL — ABNORMAL HIGH (ref 12.0–15.0)
MCH: 31 pg (ref 26.0–34.0)
MCHC: 33.4 g/dL (ref 30.0–36.0)
MCV: 92.9 fL (ref 78.0–100.0)
PLATELETS: UNDETERMINED 10*3/uL (ref 150–400)
RBC: 5.67 MIL/uL — ABNORMAL HIGH (ref 3.87–5.11)
RDW: 19.5 % — AB (ref 11.5–15.5)
WBC: 1.9 10*3/uL — ABNORMAL LOW (ref 4.0–10.5)

## 2016-11-22 LAB — BASIC METABOLIC PANEL
ANION GAP: 6 (ref 5–15)
ANION GAP: 11 (ref 5–15)
Anion gap: 4 — ABNORMAL LOW (ref 5–15)
BUN: 99 mg/dL — AB (ref 6–20)
BUN: 98 mg/dL — ABNORMAL HIGH (ref 6–20)
BUN: 94 mg/dL — ABNORMAL HIGH (ref 6–20)
CALCIUM: 8.7 mg/dL — AB (ref 8.9–10.3)
CALCIUM: 8.5 mg/dL — AB (ref 8.9–10.3)
CO2: 20 mmol/L — AB (ref 22–32)
CO2: 20 mmol/L — AB (ref 22–32)
CO2: 17 mmol/L — ABNORMAL LOW (ref 22–32)
CREATININE: 2.32 mg/dL — AB (ref 0.44–1.00)
Calcium: 8.8 mg/dL — ABNORMAL LOW (ref 8.9–10.3)
Chloride: 117 mmol/L — ABNORMAL HIGH (ref 101–111)
Chloride: 115 mmol/L — ABNORMAL HIGH (ref 101–111)
Chloride: 114 mmol/L — ABNORMAL HIGH (ref 101–111)
Creatinine, Ser: 2.32 mg/dL — ABNORMAL HIGH (ref 0.44–1.00)
Creatinine, Ser: 2.31 mg/dL — ABNORMAL HIGH (ref 0.44–1.00)
GFR calc Af Amer: 23 mL/min — ABNORMAL LOW (ref >60)
GFR calc non Af Amer: 19 mL/min — ABNORMAL LOW (ref >60)
GFR calc non Af Amer: 20 mL/min — ABNORMAL LOW (ref >60)
GFR, EST AFRICAN AMERICAN: 23 mL/min — AB (ref >60)
GFR, EST AFRICAN AMERICAN: 23 mL/min — AB (ref >60)
GFR, EST NON AFRICAN AMERICAN: 19 mL/min — AB (ref >60)
GLUCOSE: 148 mg/dL — AB (ref 65–99)
GLUCOSE: 130 mg/dL — AB (ref 65–99)
Glucose, Bld: 134 mg/dL — ABNORMAL HIGH (ref 65–99)
POTASSIUM: 6 mmol/L — AB (ref 3.5–5.1)
Potassium: 6 mmol/L — ABNORMAL HIGH (ref 3.5–5.1)
Potassium: 5.1 mmol/L (ref 3.5–5.1)
Sodium: 141 mmol/L (ref 135–145)
Sodium: 141 mmol/L (ref 135–145)
Sodium: 142 mmol/L (ref 135–145)

## 2016-11-22 LAB — PROCALCITONIN

## 2016-11-22 LAB — I-STAT TROPONIN, ED: TROPONIN I, POC: 0.04 ng/mL (ref 0.00–0.08)

## 2016-11-22 LAB — CBG MONITORING, ED
GLUCOSE-CAPILLARY: 189 mg/dL — AB (ref 65–99)
Glucose-Capillary: 139 mg/dL — ABNORMAL HIGH (ref 65–99)
Glucose-Capillary: 130 mg/dL — ABNORMAL HIGH (ref 65–99)
Glucose-Capillary: 170 mg/dL — ABNORMAL HIGH (ref 65–99)

## 2016-11-22 LAB — BRAIN NATRIURETIC PEPTIDE: B Natriuretic Peptide: 74.2 pg/mL (ref 0.0–100.0)

## 2016-11-22 MED ORDER — ACETAMINOPHEN 650 MG RE SUPP: 650.0000 mg | Freq: Four times a day (QID) | RECTAL | Status: DC | PRN

## 2016-11-22 MED ORDER — ALBUTEROL SULFATE (2.5 MG/3ML) 0.083% IN NEBU: 5.0000 mg | INHALATION_SOLUTION | RESPIRATORY_TRACT | Status: DC | PRN

## 2016-11-22 MED ORDER — GUAIFENESIN ER 600 MG PO TB12: 600.0000 mg | ORAL_TABLET | Freq: Two times a day (BID) | ORAL | Status: DC | PRN

## 2016-11-22 MED ORDER — FUROSEMIDE 40 MG PO TABS
40.0000 mg | ORAL_TABLET | Freq: Two times a day (BID) | ORAL | Status: DC
  Administered 2016-11-23: 40 mg via ORAL
  Filled 2016-11-22: qty 1

## 2016-11-22 MED ORDER — FUROSEMIDE 10 MG/ML IJ SOLN: 40.0000 mg | Freq: Two times a day (BID) | INTRAMUSCULAR | Status: DC

## 2016-11-22 MED ORDER — FOLIC ACID 0.5 MG HALF TAB
0.5000 mg | ORAL_TABLET | Freq: Every day | ORAL | Status: DC
  Administered 2016-11-22: 0.5 mg via ORAL
  Filled 2016-11-22 (×2): qty 1

## 2016-11-22 MED ORDER — METHOCARBAMOL 500 MG PO TABS
500.0000 mg | ORAL_TABLET | Freq: Three times a day (TID) | ORAL | Status: DC | PRN
Start: 2016-11-22 — End: 2016-12-02

## 2016-11-22 MED ORDER — ALBUTEROL SULFATE (2.5 MG/3ML) 0.083% IN NEBU
5.0000 mg | INHALATION_SOLUTION | Freq: Once | RESPIRATORY_TRACT | Status: AC
  Administered 2016-11-22: 5 mg via RESPIRATORY_TRACT
  Filled 2016-11-22: qty 6

## 2016-11-22 MED ORDER — INSULIN ASPART 100 UNIT/ML ~~LOC~~ SOLN
6.0000 [IU] | Freq: Once | SUBCUTANEOUS | Status: AC
  Administered 2016-11-22: 6 [IU] via INTRAVENOUS
  Filled 2016-11-22: qty 1

## 2016-11-22 MED ORDER — MAGNESIUM OXIDE 400 (241.3 MG) MG PO TABS
400.0000 mg | ORAL_TABLET | Freq: Every day | ORAL | Status: DC
  Administered 2016-11-22 – 2016-12-02 (×11): 400 mg via ORAL
  Filled 2016-11-22 (×11): qty 1

## 2016-11-22 MED ORDER — FLUTICASONE PROPIONATE 50 MCG/ACT NA SUSP
2.0000 | Freq: Every day | NASAL | Status: DC
  Administered 2016-11-23 – 2016-12-02 (×10): 2 via NASAL
  Filled 2016-11-22: qty 16

## 2016-11-22 MED ORDER — VITAMIN B-6 100 MG PO TABS
100.0000 mg | ORAL_TABLET | Freq: Every day | ORAL | Status: DC
  Administered 2016-11-22 – 2016-12-02 (×11): 100 mg via ORAL
  Filled 2016-11-22 (×11): qty 1

## 2016-11-22 MED ORDER — HYDROCODONE-ACETAMINOPHEN 5-325 MG PO TABS
1.0000 | ORAL_TABLET | ORAL | Status: DC | PRN
  Administered 2016-11-26: 2 via ORAL
  Filled 2016-11-22: qty 2

## 2016-11-22 MED ORDER — METHYLPREDNISOLONE SODIUM SUCC 125 MG IJ SOLR
125.0000 mg | Freq: Once | INTRAMUSCULAR | Status: AC
  Administered 2016-11-22: 125 mg via INTRAVENOUS
  Filled 2016-11-22: qty 2

## 2016-11-22 MED ORDER — HYPROMELLOSE (GONIOSCOPIC) 2.5 % OP SOLN: 1.0000 [drp] | Freq: Two times a day (BID) | OPHTHALMIC | Status: DC

## 2016-11-22 MED ORDER — FUROSEMIDE 10 MG/ML IJ SOLN
40.0000 mg | Freq: Once | INTRAMUSCULAR | Status: AC
  Administered 2016-11-22: 40 mg via INTRAVENOUS
  Filled 2016-11-22: qty 4

## 2016-11-22 MED ORDER — LORAZEPAM 1 MG PO TABS: 1.0000 mg | ORAL_TABLET | Freq: Every evening | ORAL | Status: DC | PRN

## 2016-11-22 MED ORDER — SENNOSIDES-DOCUSATE SODIUM 8.6-50 MG PO TABS: 1.0000 | ORAL_TABLET | Freq: Every evening | ORAL | Status: DC | PRN

## 2016-11-22 MED ORDER — FUROSEMIDE 10 MG/ML IJ SOLN: 40.0000 mg | Freq: Once | INTRAMUSCULAR | Status: DC

## 2016-11-22 MED ORDER — LORAZEPAM 0.5 MG PO TABS
0.5000 mg | ORAL_TABLET | Freq: Every morning | ORAL | Status: DC
Start: 2016-11-22 — End: 2016-12-02
  Administered 2016-11-22 – 2016-12-02 (×11): 0.5 mg via ORAL
  Filled 2016-11-22 (×11): qty 1

## 2016-11-22 MED ORDER — HYPROMELLOSE (GONIOSCOPIC) 2.5 % OP SOLN
1.0000 [drp] | Freq: Two times a day (BID) | OPHTHALMIC | Status: DC
  Filled 2016-11-22: qty 15

## 2016-11-22 MED ORDER — POLYVINYL ALCOHOL 1.4 % OP SOLN
1.0000 [drp] | Freq: Two times a day (BID) | OPHTHALMIC | Status: DC
  Administered 2016-11-22 – 2016-12-02 (×20): 1 [drp] via OPHTHALMIC
  Filled 2016-11-22 (×2): qty 15

## 2016-11-22 MED ORDER — INSULIN GLARGINE 100 UNIT/ML ~~LOC~~ SOLN
30.0000 [IU] | Freq: Every day | SUBCUTANEOUS | Status: DC
  Administered 2016-11-22 – 2016-12-02 (×11): 30 [IU] via SUBCUTANEOUS
  Filled 2016-11-22 (×11): qty 0.3

## 2016-11-22 MED ORDER — INSULIN ASPART 100 UNIT/ML ~~LOC~~ SOLN
0.0000 [IU] | Freq: Three times a day (TID) | SUBCUTANEOUS | Status: DC
  Administered 2016-11-22 (×2): 2 [IU] via SUBCUTANEOUS
  Administered 2016-11-23: 3 [IU] via SUBCUTANEOUS
  Administered 2016-11-23 – 2016-11-24 (×2): 1 [IU] via SUBCUTANEOUS
  Administered 2016-11-24 – 2016-11-25 (×2): 2 [IU] via SUBCUTANEOUS
  Administered 2016-11-26: 3 [IU] via SUBCUTANEOUS
  Administered 2016-11-27: 5 [IU] via SUBCUTANEOUS
  Administered 2016-11-27 (×2): 1 [IU] via SUBCUTANEOUS
  Administered 2016-11-28: 3 [IU] via SUBCUTANEOUS
  Administered 2016-11-28: 2 [IU] via SUBCUTANEOUS
  Administered 2016-11-28: 5 [IU] via SUBCUTANEOUS
  Administered 2016-11-29 (×2): 1 [IU] via SUBCUTANEOUS
  Administered 2016-11-30: 3 [IU] via SUBCUTANEOUS
  Administered 2016-11-30: 1 [IU] via SUBCUTANEOUS
  Administered 2016-12-01: 3 [IU] via SUBCUTANEOUS
  Administered 2016-12-02: 1 [IU] via SUBCUTANEOUS
  Filled 2016-11-22: qty 1

## 2016-11-22 MED ORDER — AMLODIPINE BESYLATE 10 MG PO TABS
10.0000 mg | ORAL_TABLET | Freq: Every day | ORAL | Status: DC
  Administered 2016-11-22 – 2016-12-02 (×11): 10 mg via ORAL
  Filled 2016-11-22 (×6): qty 1
  Filled 2016-11-22: qty 2
  Filled 2016-11-22 (×4): qty 1

## 2016-11-22 MED ORDER — OMEGA-3-ACID ETHYL ESTERS 1 G PO CAPS
1.0000 g | ORAL_CAPSULE | Freq: Every day | ORAL | Status: DC
  Administered 2016-11-22 – 2016-12-02 (×11): 1 g via ORAL
  Filled 2016-11-22 (×11): qty 1

## 2016-11-22 MED ORDER — ONDANSETRON HCL 4 MG PO TABS: 4.0000 mg | ORAL_TABLET | Freq: Four times a day (QID) | ORAL | Status: DC | PRN

## 2016-11-22 MED ORDER — PANTOPRAZOLE SODIUM 40 MG PO TBEC
40.0000 mg | DELAYED_RELEASE_TABLET | Freq: Every day | ORAL | Status: DC
  Administered 2016-11-22 – 2016-12-02 (×11): 40 mg via ORAL
  Filled 2016-11-22 (×11): qty 1

## 2016-11-22 MED ORDER — IPRATROPIUM BROMIDE 0.02 % IN SOLN
0.5000 mg | Freq: Once | RESPIRATORY_TRACT | Status: AC
  Administered 2016-11-22: 0.5 mg via RESPIRATORY_TRACT
  Filled 2016-11-22: qty 2.5

## 2016-11-22 MED ORDER — HEPARIN SODIUM (PORCINE) 5000 UNIT/ML IJ SOLN
5000.0000 [IU] | Freq: Three times a day (TID) | INTRAMUSCULAR | Status: DC
Start: 2016-11-22 — End: 2016-12-02
  Administered 2016-11-22 – 2016-12-02 (×31): 5000 [IU] via SUBCUTANEOUS
  Filled 2016-11-22 (×31): qty 1

## 2016-11-22 MED ORDER — PROCHLORPERAZINE MALEATE 5 MG PO TABS
5.0000 mg | ORAL_TABLET | Freq: Four times a day (QID) | ORAL | Status: DC | PRN
  Filled 2016-11-22: qty 1

## 2016-11-22 MED ORDER — LOSARTAN POTASSIUM 50 MG PO TABS
100.0000 mg | ORAL_TABLET | Freq: Every day | ORAL | Status: DC
  Administered 2016-11-23: 100 mg via ORAL
  Filled 2016-11-22: qty 2

## 2016-11-22 MED ORDER — DEXTROSE 50 % IV SOLN
1.0000 | Freq: Once | INTRAVENOUS | Status: AC
  Administered 2016-11-22: 50 mL via INTRAVENOUS
  Filled 2016-11-22: qty 50

## 2016-11-22 MED ORDER — CYCLOBENZAPRINE HCL 10 MG PO TABS: 10.0000 mg | ORAL_TABLET | Freq: Three times a day (TID) | ORAL | Status: DC | PRN

## 2016-11-22 MED ORDER — ONDANSETRON HCL 4 MG/2ML IJ SOLN: 4.0000 mg | Freq: Four times a day (QID) | INTRAMUSCULAR | Status: DC | PRN

## 2016-11-22 MED ORDER — LEVOFLOXACIN IN D5W 500 MG/100ML IV SOLN
500.0000 mg | INTRAVENOUS | Status: DC
  Administered 2016-11-22: 500 mg via INTRAVENOUS
  Filled 2016-11-22: qty 100

## 2016-11-22 MED ORDER — FUROSEMIDE 10 MG/ML IJ SOLN
40.0000 mg | Freq: Two times a day (BID) | INTRAMUSCULAR | Status: AC
  Administered 2016-11-22 – 2016-11-23 (×2): 40 mg via INTRAVENOUS
  Filled 2016-11-22 (×2): qty 4

## 2016-11-22 MED ORDER — POLYSACCHARIDE IRON COMPLEX 150 MG PO CAPS
150.0000 mg | ORAL_CAPSULE | Freq: Two times a day (BID) | ORAL | Status: DC
  Administered 2016-11-22 – 2016-12-02 (×21): 150 mg via ORAL
  Filled 2016-11-22 (×23): qty 1

## 2016-11-22 MED ORDER — SODIUM POLYSTYRENE SULFONATE 15 GM/60ML PO SUSP
30.0000 g | Freq: Once | ORAL | Status: AC
  Administered 2016-11-22: 30 g via ORAL
  Filled 2016-11-22: qty 120

## 2016-11-22 MED ORDER — MONTELUKAST SODIUM 10 MG PO TABS
10.0000 mg | ORAL_TABLET | Freq: Every day | ORAL | Status: DC
  Administered 2016-11-22 – 2016-12-01 (×10): 10 mg via ORAL
  Filled 2016-11-22 (×10): qty 1

## 2016-11-22 MED ORDER — BISACODYL 10 MG RE SUPP: 10.0000 mg | Freq: Every day | RECTAL | Status: DC | PRN

## 2016-11-22 MED ORDER — ACETAMINOPHEN 325 MG PO TABS: 650.0000 mg | ORAL_TABLET | Freq: Four times a day (QID) | ORAL | Status: DC | PRN

## 2016-11-22 MED ORDER — IPRATROPIUM-ALBUTEROL 0.5-2.5 (3) MG/3ML IN SOLN
3.0000 mL | Freq: Four times a day (QID) | RESPIRATORY_TRACT | Status: DC
  Administered 2016-11-22 – 2016-11-23 (×5): 3 mL via RESPIRATORY_TRACT
  Filled 2016-11-22 (×6): qty 3

## 2016-11-22 MED ORDER — METHYLPREDNISOLONE SODIUM SUCC 125 MG IJ SOLR
60.0000 mg | Freq: Three times a day (TID) | INTRAMUSCULAR | Status: DC
  Administered 2016-11-22 – 2016-11-23 (×4): 60 mg via INTRAVENOUS
  Filled 2016-11-22 (×4): qty 2

## 2016-11-22 NOTE — ED Notes (Signed)
Patient returned from xray.

## 2016-11-22 NOTE — Progress Notes (Signed)
PT refused CPAP.  

## 2016-11-22 NOTE — Progress Notes (Signed)
Pharmacy Antibiotic Note  [name] is a 76 y.o. female admitted on 11/22/2016 with COPD exacerbation.  Pharmacy has been consulted for Levofloxacin dosing. SCr 2.31 on admit, CrCl~24.  Plan: Levofloxacin 500mg  IV q48h Monitor clinical progress, c/s, renal function F/u de-escalation plan/LOT     No data recorded.  Recent Labs  Lab 11/22/16 0405 11/22/16 0541  WBC 1.9* 6.2  CREATININE 2.32* 2.31*    CrCl cannot be calculated (Unknown ideal weight.).    Allergies  Allergen Reactions  . Other Other (See Comments)    "all generics make her sick"    Elicia Lamp, PharmD, BCPS Clinical Pharmacist 11/22/2016 9:41 AM

## 2016-11-22 NOTE — ED Notes (Signed)
CBG 189. 

## 2016-11-22 NOTE — ED Notes (Signed)
CBG 138

## 2016-11-22 NOTE — ED Provider Notes (Signed)
[facility]PART[facility] Provider Note   CSN: [id number] Arrival date & time:        History   Chief Complaint Chief Complaint  Patient presents with  . Chest Pain    HPI [name] is a 76 y.o. female.  The history is provided by the patient and medical records.  Chest Pain   Associated symptoms include cough and shortness of breath.     76 year old female with history of anemia, arthritis, asthma, COPD on chronic 3 L oxygen, depression, diabetes, gastric ulcers, GERD, hypertension, hyperlipidemia, sleep apnea, presenting to the ED for shortness of breath.  States it began yesterday morning and was worsening throughout the day and into the evening.  States she has had a dry cough but is not able to bring anything up.  She has noticed some wheezing.  She did try using her albuterol inhaler without relief.  She did not try any nebulizer treatments.  She has had increased lower extremity edema, takes Lasix.  Last 2D echo in 2015 with estimated EF of 60-65%.  Past Medical History:  Diagnosis Date  . Adenomatous colon polyp   . Anemia   . Arthritis   . Asthma   . COPD with asthma ([place]) 04/20/2011  . Depression   . Diabetes mellitus   . Diverticulosis   . Gastric ulcer   . GERD (gastroesophageal reflux disease)   . HTN (hypertension)   . Hypercholesterolemia   . Internal hemorrhoids   . Lumbar disc disease   . OSA (obstructive sleep apnea) 09/17/2012  . Peptic ulcer disease   . 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 Umbilical hernia   . Valgus foot    right     Patient Active Problem List   Diagnosis Date Noted  . Midfoot ulcer, left, limited to breakdown of skin ([place]) 06/13/2016  . Idiopathic chronic venous hypertension of both lower extremities with inflammation 03/10/2016  . Onychomycosis 03/10/2016  . COPD exacerbation ([place]) 11/07/2013  . Upper airway cough syndrome 11/07/2013  . Insomnia 12/10/2012  . OSA (obstructive sleep  apnea) 09/17/2012  . Benign neoplasm of colon 08/14/2012  . Rectal bleeding 08/13/2012    Class: Acute  . Anemia 08/13/2012    Class: Acute  . Osteoarthritis 08/13/2012    Class: Chronic  . Acute posthemorrhagic anemia 08/13/2012  . Diverticulosis of colon (without mention of hemorrhage) 08/13/2012  . Hoarseness 07/20/2011  . COPD with asthma ([place]) 04/20/2011  . Allergic rhinitis 04/20/2011  . GI bleed 02/08/2011  . Personal history of colonic polyps 04/16/2010  . Fecal incontinence 03/23/2010  . Change in bowel habits 03/23/2010    Past Surgical History:  Procedure Laterality Date  . COLONOSCOPY  2209 or 2010    2001, 2005, 2009  colonoscopy.  [place]  . INCISE AND DRAIN ABCESS     Abdominal Wall  . LIPOMA RESECTION     Right Neck  . TOTAL KNEE ARTHROPLASTY     Bilateral  . UMBILICAL HERNIA REPAIR  2007   incarcerated  . UPPER GASTROINTESTINAL ENDOSCOPY  2001   medoff Grade A esophagitis    OB History    No data available       Home Medications    Prior to Admission medications   Medication Sig Start Date End Date Taking? Authorizing Provider  ACCU-CHEK AVIVA PLUS test strip  02/20/14   [provider]  Alcohol Swabs (ALCOHOL PREP) 70 % PADS  02/20/14  [provider]  amLODipine (NORVASC) 5 MG tablet Take 10 mg by mouth daily.     [provider]  ASSURE COMFORT LANCETS 30G [place]  02/20/14   [provider]  augmented betamethasone dipropionate (DIPROLENE-AF) 0.05 % cream Apply 1 application topically 2 (two) times daily. To both legs    [provider]  Blood Glucose Calibration (ACCU-CHEK AVIVA) SOLN  02/20/14   [provider]  Blood Glucose Monitoring Suppl (ACCU-CHEK AVIVA PLUS) W/DEVICE KIT  02/20/14   [provider]  budesonide (PULMICORT) 0.25 MG/2ML nebulizer solution Take 2 mLs (0.25 mg total) by nebulization 2 (two) times daily. 10/05/16   Chesley Mires, MD  Calcium Carbonate-Vitamin D  (CALTRATE 600+D) 600-400 MG-UNIT per tablet Take 1 tablet by mouth daily.    [provider]  fluticasone (FLONASE) 50 MCG/ACT nasal spray Place 2 sprays into both nostrils daily. 11/07/13   Chesley Mires, MD  folic acid (FOLVITE) 222 MCG tablet Take 400 mcg by mouth daily.    [provider]  glipiZIDE (GLUCOTROL XL) 10 MG 24 hr tablet Take 10 mg by mouth daily.     [provider]  HUMALOG MIX 75/25 KWIKPEN (75-25) 100 UNIT/ML SUSP Inject 15 Units into the skin at bedtime.  09/01/11   [provider]  insulin glargine (LANTUS) 100 UNIT/ML injection Inject 30 Units into the skin daily.     [provider]  ipratropium-albuterol (DUONEB) 0.5-2.5 (3) MG/3ML SOLN Take 3 mLs by nebulization every 6 (six) hours as needed. 10/05/16   Chesley Mires, MD  iron polysaccharides (NIFEREX) 150 MG capsule Take 150 mg by mouth 2 (two) times daily.    [provider]  Lancet Devices (ADJUSTABLE LANCING DEVICE) MISC  02/20/14   [provider]  LASIX 40 MG tablet Take 80 mg by mouth daily. 03/27/14   [provider]  losartan (COZAAR) 100 MG tablet  04/10/14   [provider]  magnesium oxide (MAG-OX) 400 MG tablet Take 400 mg by mouth daily.    [provider]  methocarbamol (ROBAXIN) 500 MG tablet Take 500 mg by mouth 3 (three) times daily as needed. 02/07/14   [provider]  montelukast (SINGULAIR) 10 MG tablet Take 1 tablet (10 mg total) by mouth at bedtime. 10/05/16   Chesley Mires, MD  NEXIUM 40 MG capsule Take 1 capsule in morning by mouth 10/24/13   [provider]  Omega-3 Fatty Acids (FISH OIL) 1000 MG CAPS Take 1 capsule by mouth daily.    [provider]  potassium chloride SA (K-DUR,KLOR-CON) 20 MEQ tablet Take 20 mEq by mouth daily.    [provider]  prochlorperazine (COMPAZINE) 5 MG tablet Take 5 mg by mouth every 6 (six) hours as needed for nausea.    [provider]    Propylene Glycol (SYSTANE BALANCE) 0.6 % SOLN Apply 1 drop to eye 2 (two) times daily.    [provider]  pyridOXINE (VITAMIN B-6) 100 MG tablet Take 100 mg by mouth daily.    [provider]  traMADol (ULTRAM) 50 MG tablet Take 50 mg by mouth every 6 (six) hours as needed for severe pain.    [provider]  vitamin B-12 (CYANOCOBALAMIN) 1000 MCG tablet Take 1,000 mcg by mouth daily.    [provider]  Vitamin D, Ergocalciferol, (DRISDOL) 50000 UNITS CAPS capsule Take 50,000 Units by mouth 2 (two) times a week. 04/07/14   [provider]  vitamin E  400 UNIT capsule Take 400 Units by mouth daily.    [provider]  ZOCOR 20 MG tablet Take 20 mg by mouth daily. 03/16/14   [provider]    Family History Family History  Problem Relation Age of Onset  . Breast cancer Maternal Aunt     Social History Social History   Tobacco Use  . Smoking status: Former Smoker    Packs/day: 2.00    Years: 5.00    Pack years: 10.00    Types: Cigarettes    Last attempt to quit: 01/10/1958    Years since quitting: 58.9  . Smokeless tobacco: Never Used  Substance Use Topics  . Alcohol use: No    Comment: Quit when she quit smoking  . Drug use: No     Allergies   Other   Review of Systems Review of Systems  Respiratory: Positive for cough, chest tightness, shortness of breath and wheezing.   All other systems reviewed and are negative.    Physical Exam Updated Vital Signs BP (!) 162/64 (BP Location: Right Arm)   Pulse (!) 51   Resp (!) 25   SpO2 100%   Physical Exam  Constitutional: She is oriented to person, place, and time. She appears well-developed and well-nourished.  HENT:  Head: Normocephalic and atraumatic.  Mouth/Throat: Oropharynx is clear and moist.  Eyes: Conjunctivae and EOM are normal. Pupils are equal, round, and reactive to light.  Neck: Normal range of motion.  Mild JVD noted  Cardiovascular: Normal  rate, regular rhythm and normal heart sounds.  Pulmonary/Chest: Effort normal. She has wheezes.  Mild accessory muscle use noted, expiratory wheezes noted, dry cough, O2 sat 96% on baseline 3 L  Abdominal: Soft. Bowel sounds are normal.  Musculoskeletal: Normal range of motion.  Lymphedema of both legs, some pitting edema noted  Neurological: She is alert and oriented to person, place, and time.  Skin: Skin is warm and dry.  Psychiatric: She has a normal mood and affect.  Nursing note and vitals reviewed.    ED Treatments / Results  Labs (all labs ordered are listed, but only abnormal results are displayed) Labs Reviewed  BASIC METABOLIC PANEL - Abnormal; Notable for the following components:      Result Value   Potassium 6.0 (*)    Chloride 117 (*)    CO2 20 (*)    Glucose, Bld 148 (*)    BUN 99 (*)    Creatinine, Ser 2.32 (*)    Calcium 8.7 (*)    GFR calc non Af Amer 19 (*)    GFR calc Af Amer 23 (*)    Anion gap 4 (*)    All other components within normal limits  CBC - Abnormal; Notable for the following components:   WBC 1.9 (*)    RBC 5.67 (*)    Hemoglobin 17.6 (*)    HCT 52.7 (*)    RDW 19.5 (*)    All other components within normal limits  BRAIN NATRIURETIC PEPTIDE  CBC WITH DIFFERENTIAL/PLATELET  BASIC METABOLIC PANEL  I-STAT TROPONIN, ED    EKG  EKG Interpretation  Date/Time:  Tuesday November 22 2016 04:00:23 EST Ventricular Rate:  51 PR Interval:    QRS Duration: 105 QT Interval:  467 QTC Calculation: 431 R Axis:   33 Text Interpretation:  Sinus rhythm Short PR interval No significant change was found Confirmed by Ezequiel Essex [phone]) on 11/22/2016 4:12:59 AM       Radiology  Dg Chest 2 View  Result Date: 11/22/2016 CLINICAL DATA:  Acute onset of generalized chest tightness and shortness of breath. EXAM: CHEST  2 VIEW COMPARISON:  Chest radiograph performed 05/19/2015 FINDINGS: The lungs are well-aerated. Vascular congestion is noted.  Increased interstitial markings raise concern for pulmonary edema. There is no evidence of pleural effusion or pneumothorax. The heart is mildly enlarged. No acute osseous abnormalities are seen. Degenerative change is noted at the left glenohumeral joint, with osteophyte formation. IMPRESSION: Vascular congestion and mild cardiomegaly. Increased interstitial markings raise concern for pulmonary edema. Electronically Signed   By: Garald Balding M.D.   On: 11/22/2016 04:32    Procedures Procedures (including critical care time)  Medications Ordered in ED Medications  furosemide (LASIX) injection 40 mg (not administered)  methylPREDNISolone sodium succinate (SOLU-MEDROL) 125 mg/2 mL injection 125 mg (125 mg Intravenous Given 11/22/16 0513)  ipratropium (ATROVENT) nebulizer solution 0.5 mg (0.5 mg Nebulization Given 11/22/16 0511)  albuterol (PROVENTIL) (2.5 MG/3ML) 0.083% nebulizer solution 5 mg (5 mg Nebulization Given 11/22/16 0511)     Initial Impression / Assessment and Plan / ED Course  I have reviewed the triage vital signs and the nursing notes.  Pertinent labs & imaging results that were available during my care of the patient were reviewed by me and considered in my medical decision making (see chart for details).  76 year old female here with shortness of breath.  Has history of COPD as well as asthma, on 3 L chronically.  She is afebrile and nontoxic.  Does have some wheezes as well as some JVD and peripheral edema.  Seems she may be mixed clinical picture of CHF/COPD.  Will obtain labs, CXR.  Given nebs, solu-medrol.  Patient also bradycardic here which is new.  At prior PCP/pulmonary visits HR was usually in the 70's-80's.  Will monitor this.  First set of labs with abnormal numbers-- K+ 6.0 with normal EKG, WBC count of 1.9 and hgb of 17.6.  Trop is negative.  BNP WNL.  CXR with vascular congestion/pulmonary edema.  Lasix ordered.  Will repeat CBC and BMP.  6:58 AM Repeat CBC/BMP  pending.  Care will be signed out to oncoming provider.  Pending results and patient's improvement after lasix, may need admission.    Patient seen and evaluated with attending physician, Dr. Wyvonnia Dusky, who agrees with assessment and plan of care.  Final Clinical Impressions(s) / ED Diagnoses   Final diagnoses:  Shortness of breath    ED Discharge Orders    None       Larene Pickett, PA-C 11/22/16 1610    Ezequiel Essex, MD 11/22/16 [phone]

## 2016-11-22 NOTE — ED Notes (Signed)
Pharmacy notified to please verify medications

## 2016-11-22 NOTE — H&P (Signed)
History and Physical    [name] WUJ:[id number] DOB: Feb 06, 1940 DOA: 11/22/2016   PCP: Burnard Bunting, MD   Patient coming from:  Home    Chief Complaint: Shortness of breath   HPI: [name] is a 76 y.o. female with medical history significant for asthma, COPD, on chronic 3-4 L oxygen, diabetes, GERD, hypertension, hyperlipidemia, OSA, depression, presenting to the emergency department after experiencing worsening shortness of breath since yesterday. The patient reports dry cough. She also has noted some wheezing. At home, she had tried to use her albuterol, without relief. She did not try any nebulizer treatments at home. She denies any rhinorrhea, or hemoptysis. She denies any fever or chills. She denies any night sweats or mucositis. She denies any sick contacts. She lives alone., Nausea or vomiting. She reports chronic lower extremity swelling, but she denies any worsening of it. She is a very poor historian, however no confusion was reported. She does not smoke. She is compliant with her medications. Her last fluoxetine was in 2017, and current pneumonia vaccine was in 2012.  ED Course:  BP (!) 147/65   Pulse (!) 58   Resp 20   SpO2 100%    Chest x-ray was consistent with pulmonary edema. She received albuterol nebulizer 2, Lasix 40 mg IV, and sodium is 125 mg 1, with very slow response. Hemoglobin 9.2, platelets 139, bicarbonate 20, creatinine 2.31, calcium 8.8, glucose 130. In addition, her potassium was noted to be 6, receiving Kayexalate,  D50  albuterol, and a new value is pending.  Review of Systems:  As per HPI otherwise all other systems reviewed and are negative  Past Medical History:  Diagnosis Date  . Adenomatous colon polyp   . Anemia   . Arthritis   . Asthma   . COPD (chronic obstructive pulmonary disease) ([place])   . COPD with asthma ([place]) 04/20/2011  . Depression   . Diabetes mellitus   . Diverticulosis   . Gastric ulcer   . GERD (gastroesophageal  reflux disease)   . HTN (hypertension)   . Hypercholesterolemia   . Internal hemorrhoids   . Lumbar disc disease   . OSA (obstructive sleep apnea) 09/17/2012  . Peptic ulcer disease   . 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 Umbilical hernia   . Valgus foot    right     Past Surgical History:  Procedure Laterality Date  . COLONOSCOPY  2209 or 2010    2001, 2005, 2009  colonoscopy.  [place]  . INCISE AND DRAIN ABCESS     Abdominal Wall  . LIPOMA RESECTION     Right Neck  . TOTAL KNEE ARTHROPLASTY     Bilateral  . UMBILICAL HERNIA REPAIR  2007   incarcerated  . UPPER GASTROINTESTINAL ENDOSCOPY  2001   medoff Grade A esophagitis    Social History Social History   Socioeconomic History  . Marital status: Single    Spouse name: Not on file  . Number of children: 0  . Years of education: Not on file  . Highest education level: Not on file  Social Needs  . Financial resource strain: Not on file  . Food insecurity - worry: Not on file  . Food insecurity - inability: Not on file  . Transportation needs - medical: Not on file  . Transportation needs - non-medical: Not on file  Occupational History  . Occupation: Retired    Comment: [employer]  Tobacco Use  . Smoking  status: Former Smoker    Packs/day: 2.00    Years: 5.00    Pack years: 10.00    Types: Cigarettes    Last attempt to quit: 01/10/1958    Years since quitting: 58.9  . Smokeless tobacco: Never Used  Substance and Sexual Activity  . Alcohol use: No    Comment: Quit when she quit smoking  . Drug use: No  . Sexual activity: No  Other Topics Concern  . Not on file  Social History Narrative   Retired from [employer] no children   Sister in [place]  . Other Other (See Comments)    "all generics make her sick"    Family History  Problem Relation Age of Onset  . Breast cancer Maternal Aunt       Prior to Admission medications   Medication  Sig Start Date End Date Taking? Authorizing Provider  amLODipine (NORVASC) 5 MG tablet Take 10 mg by mouth daily.    Yes [provider]  budesonide (PULMICORT) 0.25 MG/2ML nebulizer solution Take 2 mLs (0.25 mg total) by nebulization 2 (two) times daily. 10/05/16  Yes Chesley Mires, MD  Calcium Carbonate-Vitamin D (CALTRATE 600+D) 600-400 MG-UNIT per tablet Take 1 tablet by mouth daily.   Yes [provider]  cyclobenzaprine (FLEXERIL) 10 MG tablet Take 10 mg 3 (three) times daily as needed by mouth for muscle spasms.   Yes [provider]  fluticasone (FLONASE) 50 MCG/ACT nasal spray Place 2 sprays into both nostrils daily. 11/07/13  Yes Chesley Mires, MD  folic acid (FOLVITE) 161 MCG tablet Take 400 mcg by mouth daily.   Yes [provider]  glipiZIDE (GLUCOTROL XL) 10 MG 24 hr tablet Take 10 mg 2 (two) times daily by mouth.    Yes [provider]  HUMALOG MIX 75/25 KWIKPEN (75-25) 100 UNIT/ML SUSP Inject 15 Units into the skin at bedtime.  09/01/11  Yes [provider]  insulin glargine (LANTUS) 100 UNIT/ML injection Inject 30 Units into the skin daily.    Yes [provider]  ipratropium-albuterol (DUONEB) 0.5-2.5 (3) MG/3ML SOLN Take 3 mLs by nebulization every 6 (six) hours as needed. Patient taking differently: Take 3 mLs every 6 (six) hours as needed by nebulization (SOB).  10/05/16  Yes Chesley Mires, MD  iron polysaccharides (NIFEREX) 150 MG capsule Take 150 mg by mouth 2 (two) times daily.   Yes [provider]  LASIX 40 MG tablet Take 40 mg 2 (two) times daily by mouth.  03/27/14  Yes [provider]  LORazepam (ATIVAN) 1 MG tablet Take 0.5-1 mg See admin instructions by mouth. Take 0.5 tablet every morning and 1 tablet in the evening as needed for sleep   Yes [provider]  losartan (COZAAR) 100 MG tablet Take 100 mg daily by mouth.  04/10/14  Yes [provider]  magnesium oxide (MAG-OX) 400 MG  tablet Take 400 mg by mouth daily.   Yes [provider]  methocarbamol (ROBAXIN) 500 MG tablet Take 500 mg 3 (three) times daily as needed by mouth for muscle spasms.  02/07/14  Yes [provider]  montelukast (SINGULAIR) 10 MG tablet Take 1 tablet (10 mg total) by mouth at bedtime. 10/05/16  Yes Chesley Mires, MD  NEXIUM 40 MG capsule Take 1 capsule in morning by mouth 10/24/13  Yes [provider]  Omega-3 Fatty Acids (FISH OIL) 1000 MG CAPS Take 1 capsule  by mouth daily.   Yes [provider]  potassium chloride SA (K-DUR,KLOR-CON) 20 MEQ tablet Take 20 mEq by mouth daily.   Yes [provider]  prochlorperazine (COMPAZINE) 5 MG tablet Take 5 mg by mouth every 6 (six) hours as needed for nausea.   Yes [provider]  Propylene Glycol (SYSTANE BALANCE) 0.6 % SOLN Apply 1 drop to eye 2 (two) times daily.   Yes [provider]  pyridOXINE (VITAMIN B-6) 100 MG tablet Take 100 mg by mouth daily.   Yes [provider]  traMADol (ULTRAM) 50 MG tablet Take 50 mg by mouth every 6 (six) hours as needed for severe pain.   Yes [provider]  traZODone (DESYREL) 50 MG tablet Take 50 mg at bedtime as needed by mouth for sleep.   Yes [provider]  vitamin B-12 (CYANOCOBALAMIN) 1000 MCG tablet Take 1,000 mcg by mouth daily.   Yes [provider]  Vitamin D, Ergocalciferol, (DRISDOL) 50000 UNITS CAPS capsule Take 50,000 Units by mouth 2 (two) times a week. 04/07/14  Yes [provider]  vitamin E 400 UNIT capsule Take 400 Units by mouth daily.   Yes [provider]  ZOCOR 20 MG tablet Take 20 mg by mouth daily. 03/16/14  Yes [provider]  augmented betamethasone dipropionate (DIPROLENE-AF) 0.05 % cream Apply 1 application topically 2 (two) times daily. To both legs    [provider]    Physical Exam:  Vitals:   11/22/16 0715 11/22/16 0723 11/22/16 0745 11/22/16 0815    BP: (!) 145/56  (!) 149/69 (!) 147/65  Pulse: (!) 43  (!) 45 (!) 58  Resp: (!) 22  (!) 21 20  SpO2: 94% 94% 100% 100%   Constitutional: Moderate degree of discomfort due to shortness of breath. Eyes: PERRL, lids and conjunctivae normal ENMT: Mucous membranes are moist, without exudate or lesions  Neck: normal, supple, no masses, no thyromegaly Respiratory: Remarkable for bilateral wheezing, minimal crackles at the bases, no rhonchi.  Normal respiratory effort  Cardiovascular: Tachycardia rate and rhythm,  soft 1/6 systolic murmur, rubs or gallops. 1+ bilateral lower extremity edema. 2+ pedal pulses. No carotid bruits.  Abdomen: Soft, morbidly obese non tender, No hepatosplenomegaly. Bowel sounds positive.  Musculoskeletal: no clubbing / cyanosis. Moves all extremities Skin: no jaundice, No lesions. Chronic venous stasis changes and lymphedema in lower extremities Neurologic: Sensation intact  Strength equal in all extremities Psychiatric:   Alert and oriented x 3. Anxious mood.     Labs on Admission: I have personally reviewed following labs and imaging studies  CBC: Recent Labs  Lab 11/22/16 0405 11/22/16 0541  WBC 1.9* 6.2  NEUTROABS  --  4.4  HGB 17.6* 9.2*  HCT 52.7* 28.0*  MCV 92.9 92.4  PLT PLATELET CLUMPS NOTED ON SMEAR, UNABLE TO ESTIMATE 139*    Basic Metabolic Panel: Recent Labs  Lab 11/22/16 0405 11/22/16 0541  NA 141 141  K 6.0* 6.0*  CL 117* 115*  CO2 20* 20*  GLUCOSE 148* 130*  BUN 99* 98*  CREATININE 2.32* 2.31*  CALCIUM 8.7* 8.8*    GFR: CrCl cannot be calculated (Unknown ideal weight.).  Liver Function Tests: No results for input(s): AST, ALT, ALKPHOS, BILITOT, PROT, ALBUMIN in the last 168 hours. No results for input(s): LIPASE, AMYLASE in the last 168 hours. No results for input(s): AMMONIA in the last 168 hours.  Coagulation Profile: No results for input(s): INR, PROTIME in the last 168 hours.  Cardiac Enzymes: No results for  input(s): CKTOTAL, CKMB, CKMBINDEX, TROPONINI in the last 168 hours.  BNP (last 3 results) No results for input(s): PROBNP in the last 8760 hours.  HbA1C: No results for input(s): HGBA1C in the last 72 hours.  CBG: Recent Labs  Lab 11/22/16 0824  GLUCAP 139*    Lipid Profile: No results for input(s): CHOL, HDL, LDLCALC, TRIG, CHOLHDL, LDLDIRECT in the last 72 hours.  Thyroid Function Tests: No results for input(s): TSH, T4TOTAL, FREET4, T3FREE, THYROIDAB in the last 72 hours.  Anemia Panel: No results for input(s): VITAMINB12, FOLATE, FERRITIN, TIBC, IRON, RETICCTPCT in the last 72 hours.  Urine analysis:    Component Value Date/Time   COLORURINE YELLOW 12/16/2011 [place] 12/16/2011 1717   LABSPEC 1.010 12/16/2011 1717   PHURINE 5.0 12/16/2011 1717   GLUCOSEU NEGATIVE 12/16/2011 1717   HGBUR NEGATIVE 12/16/2011 1717   BILIRUBINUR NEGATIVE 12/16/2011 1717   KETONESUR NEGATIVE 12/16/2011 1717   PROTEINUR NEGATIVE 12/16/2011 1717   UROBILINOGEN 0.2 12/16/2011 1717   NITRITE NEGATIVE 12/16/2011 1717   LEUKOCYTESUR NEGATIVE 12/16/2011 1717    Sepsis Labs: @LABRCNTIP (procalcitonin:4,lacticidven:4) )No results found for this or any previous visit (from the past 240 hour(s)).   Radiological Exams on Admission: Dg Chest 2 View  Result Date: 11/22/2016 CLINICAL DATA:  Acute onset of generalized chest tightness and shortness of breath. EXAM: CHEST  2 VIEW COMPARISON:  Chest radiograph performed 05/19/2015 FINDINGS: The lungs are well-aerated. Vascular congestion is noted. Increased interstitial markings raise concern for pulmonary edema. There is no evidence of pleural effusion or pneumothorax. The heart is mildly enlarged. No acute osseous abnormalities are seen. Degenerative change is noted at the left glenohumeral joint, with osteophyte formation. IMPRESSION: Vascular congestion and mild cardiomegaly. Increased interstitial markings raise concern for  pulmonary edema. Electronically Signed   By: Garald Balding M.D.   On: 11/22/2016 04:32    EKG: Independently reviewed.  Assessment/Plan Active Problems:   COPD exacerbation (HCC)   COPD with asthma (HCC)   Allergic rhinitis   Anemia   Osteoarthritis   OSA (obstructive sleep apnea)   Insomnia   Acute exacerbation of CHF (congestive heart failure) (HCC)   Acute on chronic respiratory failure with hypoxia (HCC)    Acute on chronic hypoxic respiratory failure likely secondary toAcute on chronic COPD exacerbation, with mild decompensation of CHF . CXR remarkable for pulmonary edema. Last 2 D echo in 2015 showed EF 65-70 percentsevere LVH, vigorous systolic  BNP 74  Osats were normal in 4 L   WBC 6   Current weight 226 lbs  Received 2 Albuterol treatments, Solumedrol 125 mg IV x1,  IV lasix 40 mg x 1 in ED.  Place telemetry obs  COPD order set  2 D echo  Duoneb q 6 hrs and Albuterol q 2 h prn  Solumedrol 60 mg q 8 h Resp viral panel   Mucinex bid  O2 prn Incentive spirometry Levaquin for antibiotic coverage CXR in am   V Lasix 40 mg bid today, may resume her oral Lasix in am  Strict I/Os  Acute hyperkalemia, likely due to Cozaar and K supplements  Hyperkalemia protocol has been followed in the ER,  has been given IV insulin-D50-Kayexalate-albuterol treatment. Initial K 6.0 . TN 0.04  . EKG SR no ACS.  Recheck BMET later today   Repeat EKG as indicated Check Troponin Telemetry Hold Cozaar and K supplement    Hypertension BP (!) 157/65   Pulse  69   Resp (!) 24   SpO2 100%  Continue home anti-hypertensive medications , hold Cozaar  Hyperlipidemia Continue home Lovaza   Type II Diabetes Current blood sugar level is 130  Lab Results  Component Value Date   HGBA1C 6.1 (H) 02/04/2013  Hgb A1C Hold home oral diabetic medications.  Lantus , SSI   GERD, no acute symptoms Continue PPI  OSA Continue CPAP nightly   Chronic kidney disease stage 4. Cr 2.3 at  baseline  Lab Results  Component Value Date   CREATININE 2.31 (H) 11/22/2016   CREATININE 2.32 (H) 11/22/2016   CREATININE 2.20 (H) 09/03/2014  Repeat CMET in am   Anemia of chronic disease Hemoglobin on admission 9.2, no bleeding issues noted  Repeat CBC in am  No transfusion is indicated at this time Continue Iron supplements   DVT prophylaxis:  Heparin  Code Status:    Full  Family Communication:  Discussed with patient Disposition Plan: Expect patient to be discharged to home after condition improves Consults called:   None   Admission status:  Tele Obs    Sharene Butters, PA-C [facility]   11/22/2016, 9:20 AM

## 2016-11-22 NOTE — ED Triage Notes (Signed)
Patient with one day history of shortness of breath and chest tightness.  Patient stated to EMS that it got worse during the day into evening and night.  Patient has history of COPD, wears oxygen at home at 3L.

## 2016-11-23 ENCOUNTER — Observation Stay (HOSPITAL_COMMUNITY): Payer: Medicare Other

## 2016-11-23 DIAGNOSIS — G4733 Obstructive sleep apnea (adult) (pediatric): Secondary | ICD-10-CM | POA: Diagnosis not present

## 2016-11-23 DIAGNOSIS — J9621 Acute and chronic respiratory failure with hypoxia: Secondary | ICD-10-CM | POA: Diagnosis not present

## 2016-11-23 DIAGNOSIS — R0602 Shortness of breath: Secondary | ICD-10-CM | POA: Diagnosis not present

## 2016-11-23 DIAGNOSIS — E872 Acidosis, unspecified: Secondary | ICD-10-CM | POA: Diagnosis present

## 2016-11-23 DIAGNOSIS — N183 Chronic kidney disease, stage 3 unspecified: Secondary | ICD-10-CM | POA: Clinically undetermined

## 2016-11-23 DIAGNOSIS — N189 Chronic kidney disease, unspecified: Secondary | ICD-10-CM | POA: Diagnosis not present

## 2016-11-23 DIAGNOSIS — N179 Acute kidney failure, unspecified: Secondary | ICD-10-CM | POA: Diagnosis not present

## 2016-11-23 DIAGNOSIS — J441 Chronic obstructive pulmonary disease with (acute) exacerbation: Secondary | ICD-10-CM | POA: Diagnosis not present

## 2016-11-23 DIAGNOSIS — D631 Anemia in chronic kidney disease: Secondary | ICD-10-CM | POA: Diagnosis not present

## 2016-11-23 DIAGNOSIS — I5033 Acute on chronic diastolic (congestive) heart failure: Secondary | ICD-10-CM | POA: Diagnosis not present

## 2016-11-23 LAB — RESPIRATORY PANEL BY PCR
ADENOVIRUS-RVPPCR: NOT DETECTED
BORDETELLA PERTUSSIS-RVPCR: NOT DETECTED
CHLAMYDOPHILA PNEUMONIAE-RVPPCR: NOT DETECTED
CORONAVIRUS 229E-RVPPCR: NOT DETECTED
CORONAVIRUS HKU1-RVPPCR: NOT DETECTED
CORONAVIRUS NL63-RVPPCR: NOT DETECTED
Coronavirus OC43: NOT DETECTED
Influenza A: NOT DETECTED
Influenza B: NOT DETECTED
MYCOPLASMA PNEUMONIAE-RVPPCR: NOT DETECTED
Metapneumovirus: NOT DETECTED
Parainfluenza Virus 1: NOT DETECTED
Parainfluenza Virus 2: NOT DETECTED
Parainfluenza Virus 3: NOT DETECTED
Parainfluenza Virus 4: NOT DETECTED
Respiratory Syncytial Virus: NOT DETECTED
Rhinovirus / Enterovirus: NOT DETECTED

## 2016-11-23 LAB — CBC
HEMATOCRIT: 27.5 % — AB (ref 36.0–46.0)
HEMOGLOBIN: 8.9 g/dL — AB (ref 12.0–15.0)
MCH: 29.6 pg (ref 26.0–34.0)
MCHC: 32.4 g/dL (ref 30.0–36.0)
MCV: 91.4 fL (ref 78.0–100.0)
Platelets: 122 10*3/uL — ABNORMAL LOW (ref 150–400)
RBC: 3.01 MIL/uL — ABNORMAL LOW (ref 3.87–5.11)
RDW: 18.8 % — AB (ref 11.5–15.5)
WBC: 8.1 10*3/uL (ref 4.0–10.5)

## 2016-11-23 LAB — HEMOGLOBIN A1C
HEMOGLOBIN A1C: 6.7 % — AB (ref 4.8–5.6)
MEAN PLASMA GLUCOSE: 146 mg/dL

## 2016-11-23 LAB — COMPREHENSIVE METABOLIC PANEL
ALBUMIN: 3 g/dL — AB (ref 3.5–5.0)
ALT: 101 U/L — ABNORMAL HIGH (ref 14–54)
ANION GAP: 8 (ref 5–15)
AST: 43 U/L — AB (ref 15–41)
Alkaline Phosphatase: 119 U/L (ref 38–126)
BILIRUBIN TOTAL: 0.6 mg/dL (ref 0.3–1.2)
BUN: 97 mg/dL — AB (ref 6–20)
CHLORIDE: 117 mmol/L — AB (ref 101–111)
CO2: 19 mmol/L — AB (ref 22–32)
Calcium: 8.5 mg/dL — ABNORMAL LOW (ref 8.9–10.3)
Creatinine, Ser: 2.48 mg/dL — ABNORMAL HIGH (ref 0.44–1.00)
GFR calc Af Amer: 21 mL/min — ABNORMAL LOW (ref >60)
GFR calc non Af Amer: 18 mL/min — ABNORMAL LOW (ref >60)
Glucose, Bld: 90 mg/dL (ref 65–99)
POTASSIUM: 5.5 mmol/L — AB (ref 3.5–5.1)
Sodium: 144 mmol/L (ref 135–145)
Total Protein: 6.9 g/dL (ref 6.5–8.1)

## 2016-11-23 LAB — GLUCOSE, CAPILLARY
GLUCOSE-CAPILLARY: 147 mg/dL — AB (ref 65–99)
GLUCOSE-CAPILLARY: 225 mg/dL — AB (ref 65–99)
GLUCOSE-CAPILLARY: 189 mg/dL — AB (ref 65–99)
Glucose-Capillary: 131 mg/dL — ABNORMAL HIGH (ref 65–99)
Glucose-Capillary: 80 mg/dL (ref 65–99)

## 2016-11-23 LAB — PROTIME-INR
INR: 1.23
Prothrombin Time: 15.4 seconds — ABNORMAL HIGH (ref 11.4–15.2)

## 2016-11-23 MED ORDER — IPRATROPIUM-ALBUTEROL 0.5-2.5 (3) MG/3ML IN SOLN: 3.0000 mL | Freq: Two times a day (BID) | RESPIRATORY_TRACT | Status: DC

## 2016-11-23 MED ORDER — INFLUENZA VAC SPLIT HIGH-DOSE 0.5 ML IM SUSY
0.5000 mL | PREFILLED_SYRINGE | INTRAMUSCULAR | Status: AC
  Administered 2016-11-24: 0.5 mL via INTRAMUSCULAR
  Filled 2016-11-23: qty 0.5

## 2016-11-23 MED ORDER — SODIUM POLYSTYRENE SULFONATE 15 GM/60ML PO SUSP
15.0000 g | Freq: Once | ORAL | Status: AC
  Administered 2016-11-23: 15 g via ORAL
  Filled 2016-11-23: qty 60

## 2016-11-23 MED ORDER — FOLIC ACID 1 MG PO TABS
1.0000 mg | ORAL_TABLET | Freq: Every day | ORAL | Status: DC
  Administered 2016-11-23 – 2016-12-02 (×10): 1 mg via ORAL
  Filled 2016-11-23 (×10): qty 1

## 2016-11-23 MED ORDER — IPRATROPIUM-ALBUTEROL 0.5-2.5 (3) MG/3ML IN SOLN: 3.0000 mL | Freq: Four times a day (QID) | RESPIRATORY_TRACT | Status: DC | PRN

## 2016-11-23 MED ORDER — PREDNISONE 20 MG PO TABS
40.0000 mg | ORAL_TABLET | Freq: Every day | ORAL | Status: AC
  Administered 2016-11-24 – 2016-11-27 (×4): 40 mg via ORAL
  Filled 2016-11-23 (×4): qty 2

## 2016-11-23 MED ORDER — PREDNISONE 20 MG PO TABS
40.0000 mg | ORAL_TABLET | Freq: Every day | ORAL | Status: DC
  Filled 2016-11-23: qty 2

## 2016-11-23 MED ORDER — LEVOFLOXACIN 500 MG PO TABS
500.0000 mg | ORAL_TABLET | Freq: Every day | ORAL | Status: DC
  Administered 2016-11-23: 500 mg via ORAL
  Filled 2016-11-23: qty 1

## 2016-11-23 MED ORDER — FUROSEMIDE 10 MG/ML IJ SOLN
40.0000 mg | Freq: Two times a day (BID) | INTRAMUSCULAR | Status: AC
  Administered 2016-11-23 – 2016-11-24 (×2): 40 mg via INTRAVENOUS
  Filled 2016-11-23 (×2): qty 4

## 2016-11-23 MED ORDER — ALBUTEROL SULFATE (2.5 MG/3ML) 0.083% IN NEBU: 5.0000 mg | INHALATION_SOLUTION | RESPIRATORY_TRACT | Status: DC | PRN

## 2016-11-23 MED ORDER — HYDRALAZINE HCL 20 MG/ML IJ SOLN
10.0000 mg | Freq: Four times a day (QID) | INTRAMUSCULAR | Status: DC | PRN
  Administered 2016-11-28: 10 mg via INTRAVENOUS
  Filled 2016-11-23: qty 1

## 2016-11-23 NOTE — Progress Notes (Signed)
PROGRESS NOTE  [name] ZJI:[id number] DOB: [birth year] DOA: 11/22/2016 PCP: Burnard Bunting, MD  HPI/Recap of past 24 hours:  [name] is a 76 y.o. year old female with medical history significant for type 2 diabetes, CKD stage III, hypertension, COPD, obstructive sleep apnea on CPAP, hyperlipidemia who presented on 11/22/2016 with worsening dyspnea and was found to have acute on chronic respiratory failure presumed secondary to combined COPD exacerbation and acute exacerbation of chronic diastolic heart failure.     In the past 24 hours, patient continues to report cough with minimal sputum production.  Denies fevers, chills, abdominal pain, chest pain.    Assessment/Plan: Active Problems:   COPD with asthma (HCC)   Allergic rhinitis   Anemia   Osteoarthritis   OSA (obstructive sleep apnea)   Insomnia   COPD exacerbation (HCC)   Acute exacerbation of CHF (congestive heart failure) (HCC)   Acute on chronic respiratory failure with hypoxia (HCC)   Acute renal failure superimposed on stage 3 chronic kidney disease (HCC)   Metabolic acidosis with normal anion gap and bicarbonate losses    Acute on chronic respiratory failure secondary to #2 and 3.  Multifactorial (COPD and CHF exacerbation).  At home oxygen requirement at rest, but unsure of requirements when mobile. -Continue supplemental oxygen, walking oxygen test - Notable JVD, and crackles on exam we will continue IV Lasix diuresis - Continue inhaler regimen  Acute exacerbation of chronic diastolic heart failure, hypervolemic.  TTE on 2015 with preserved EF.  Chest x-ray on admission with significant interstitial edema, right-sided pleural effusion and cardiomegaly.  BNP 74 however unhelpful in setting of obesity -Obtain new TTE, continue IV Lasix (40 mg twice daily) diuresis -Daily weights, strict intake and output   AKI on CKD, likely secondary to CHF exacerbation Hyperkalemia, elevated BUN and non-anion  gap metabolic acidosis, presumed related to AK I -Status post Kayexalate in the ED, need to repeat x1 -Serial BMPs -If creatinine or bicarb worsens will formally evaluate (renal ultrasound, Fe urea, urine electrolytes)  COPD exacerbation.  Improving.  Unclear etiology denies any recent sick contacts.  Reports adherence with medication regimen.  Possibly related to CHF exacerbation -De-escalate to PRN duo nebs, p.o. Levaquin, Oral prednisone, Mucinex, Singulair  Hypertension.  Uncontrolled -Home amlodipine -Holding home losartan in setting of persistent hyperkalemia and AK I -PRN IV hydralazine for SBP greater than 170  Type 2 diabetes, controlled.  A1c 6.7 (11/22/2016) -Home Lantus 30 units, NovoLog correction coverage  GERD.  Stable -Home Protonix Code Status: Full code  Family Communication: No family at bedside  Disposition Plan: IV diuresis with improved respiratory status   Consultants:  None  Procedures:  None  Antimicrobials:  Levaquin 11/22/2016-current  Cultures:  None  DVT prophylaxis: Heparin   Objective: Vitals:   11/23/16 0835 11/23/16 0841 11/23/16 1729 11/23/16 2022  BP: (!) 171/64  (!) 160/62 (!) 120/41  Pulse:   (!) 56 63  Resp:   16 18  SpO2:  98% 96% 100%  Weight:       No intake or output data in the 24 hours ending 11/23/16 2243 Filed Weights   11/23/16 0500  Weight: 103.7 kg (228 lb 9.9 oz)    Exam:   General: Lying in bed, in no apparent distress  Cardiovascular: Regular rate and rhythm, no appreciable murmurs or gallops, elevated JVD to mid neck  Respiratory: Crackles at bases into the mid lung bilaterally, no appreciable wheezes  Abdomen: Soft, nondistended  Musculoskeletal:  Normal range of motion  Skin: Chronic skin changes on bilateral lower extremities from ankle to below knee: Darkly hyperpigmented skin  Neurologic: Alert and oriented x3  Psychiatry: Appropriate affect and mood   Data Reviewed: CBC: Recent  Labs  Lab 11/22/16 0405 11/22/16 0541 11/23/16 0725  WBC 1.9* 6.2 8.1  NEUTROABS  --  4.4  --   HGB 17.6* 9.2* 8.9*  HCT 52.7* 28.0* 27.5*  MCV 92.9 92.4 91.4  PLT PLATELET CLUMPS NOTED ON SMEAR, UNABLE TO ESTIMATE 139* 546*   Basic Metabolic Panel: Recent Labs  Lab 11/22/16 0405 11/22/16 0541 11/22/16 1835 11/23/16 0725  NA 141 141 142 144  K 6.0* 6.0* 5.1 5.5*  CL 117* 115* 114* 117*  CO2 20* 20* 17* 19*  GLUCOSE 148* 130* 134* 90  BUN 99* 98* 94* 97*  CREATININE 2.32* 2.31* 2.32* 2.48*  CALCIUM 8.7* 8.8* 8.5* 8.5*   GFR: Estimated Creatinine Clearance: 21.7 mL/min (A) (by C-G formula based on SCr of 2.48 mg/dL (H)). Liver Function Tests: Recent Labs  Lab 11/23/16 0725  AST 43*  ALT 101*  ALKPHOS 119  BILITOT 0.6  PROT 6.9  ALBUMIN 3.0*   No results for input(s): LIPASE, AMYLASE in the last 168 hours. No results for input(s): AMMONIA in the last 168 hours. Coagulation Profile: Recent Labs  Lab 11/23/16 0725  INR 1.23   Cardiac Enzymes: No results for input(s): CKTOTAL, CKMB, CKMBINDEX, TROPONINI in the last 168 hours. BNP (last 3 results) No results for input(s): PROBNP in the last 8760 hours. HbA1C: Recent Labs    11/22/16 1037  HGBA1C 6.7*   CBG: Recent Labs  Lab 11/22/16 1832 11/23/16 0808 11/23/16 1225 11/23/16 1704 11/23/16 2054  GLUCAP 131* 80 147* 225* 189*   Lipid Profile: No results for input(s): CHOL, HDL, LDLCALC, TRIG, CHOLHDL, LDLDIRECT in the last 72 hours. Thyroid Function Tests: No results for input(s): TSH, T4TOTAL, FREET4, T3FREE, THYROIDAB in the last 72 hours. Anemia Panel: No results for input(s): VITAMINB12, FOLATE, FERRITIN, TIBC, IRON, RETICCTPCT in the last 72 hours. Urine analysis:    Component Value Date/Time   COLORURINE STRAW (A) 11/22/2016 1643   APPEARANCEUR CLEAR 11/22/2016 1643   LABSPEC 1.008 11/22/2016 1643   PHURINE 5.0 11/22/2016 1643   GLUCOSEU NEGATIVE 11/22/2016 1643   HGBUR NEGATIVE  11/22/2016 1643   BILIRUBINUR NEGATIVE 11/22/2016 1643   KETONESUR NEGATIVE 11/22/2016 1643   PROTEINUR NEGATIVE 11/22/2016 1643   UROBILINOGEN 0.2 12/16/2011 1717   NITRITE NEGATIVE 11/22/2016 1643   LEUKOCYTESUR TRACE (A) 11/22/2016 1643   Sepsis Labs: @LABRCNTIP (procalcitonin:4,lacticidven:4)  ) Recent Results (from the past 240 hour(s))  Respiratory Panel by PCR     Status: None   Collection Time: 11/23/16  2:10 PM  Result Value Ref Range Status   Adenovirus NOT DETECTED NOT DETECTED Final   Coronavirus 229E NOT DETECTED NOT DETECTED Final   Coronavirus HKU1 NOT DETECTED NOT DETECTED Final   Coronavirus NL63 NOT DETECTED NOT DETECTED Final   Coronavirus OC43 NOT DETECTED NOT DETECTED Final   Metapneumovirus NOT DETECTED NOT DETECTED Final   Rhinovirus / Enterovirus NOT DETECTED NOT DETECTED Final   Influenza A NOT DETECTED NOT DETECTED Final   Influenza B NOT DETECTED NOT DETECTED Final   Parainfluenza Virus 1 NOT DETECTED NOT DETECTED Final   Parainfluenza Virus 2 NOT DETECTED NOT DETECTED Final   Parainfluenza Virus 3 NOT DETECTED NOT DETECTED Final   Parainfluenza Virus 4 NOT DETECTED NOT DETECTED Final   Respiratory Syncytial  Virus NOT DETECTED NOT DETECTED Final   Bordetella pertussis NOT DETECTED NOT DETECTED Final   Chlamydophila pneumoniae NOT DETECTED NOT DETECTED Final   Mycoplasma pneumoniae NOT DETECTED NOT DETECTED Final      Studies: Dg Chest 2 View  Result Date: 11/23/2016 CLINICAL DATA:  Shortness of breath.  COPD. EXAM: CHEST  2 VIEW COMPARISON:  11/22/2016 FINDINGS: There is moderate cardiac enlargement. Aortic atherosclerosis noted. Pulmonary edema pattern has improved from the previous exam. Small pleural effusions are unchanged. IMPRESSION: 1. Improvement in pulmonary edema pattern. Electronically Signed   By: Kerby Moors M.D.   On: 11/23/2016 09:34    Scheduled Meds: . amLODipine  10 mg Oral Daily  . fluticasone  2 spray Each Nare Daily  .  folic acid  1 mg Oral Daily  . furosemide  40 mg Intravenous BID  . heparin  5,000 Units Subcutaneous Q8H  . [START ON 11/24/2016] Influenza vac split quadrivalent PF  0.5 mL Intramuscular Tomorrow-1000  . insulin aspart  0-9 Units Subcutaneous TID WC  . insulin glargine  30 Units Subcutaneous Daily  . iron polysaccharides  150 mg Oral BID  . levofloxacin  500 mg Oral Daily  . LORazepam  0.5 mg Oral q morning - 10a  . magnesium oxide  400 mg Oral Daily  . montelukast  10 mg Oral QHS  . omega-3 acid ethyl esters  1 g Oral Daily  . pantoprazole  40 mg Oral Daily  . polyvinyl alcohol  1 drop Both Eyes BID  . [START ON 11/24/2016] predniSONE  40 mg Oral Q breakfast  . pyridOXINE  100 mg Oral Daily    Continuous Infusions:    LOS: 0 days     Desiree Hane, MD [facility] Pager [phone]  If 7PM-7AM, please contact night-coverage [email or web address] Password [facility] 11/23/2016, 10:43 PM

## 2016-11-23 NOTE — Progress Notes (Signed)
0614-Central telemetry called to inform this RN that the pt's HR went down to 39, non-sustained. Pt is asymptomatic. MD notified, waiting on response. Will continue to monitor and assess.  *No response at 0645, no new orders obtained.

## 2016-11-24 ENCOUNTER — Observation Stay (HOSPITAL_COMMUNITY): Payer: Medicare Other

## 2016-11-24 DIAGNOSIS — G4733 Obstructive sleep apnea (adult) (pediatric): Secondary | ICD-10-CM | POA: Diagnosis present

## 2016-11-24 DIAGNOSIS — N179 Acute kidney failure, unspecified: Secondary | ICD-10-CM | POA: Diagnosis not present

## 2016-11-24 DIAGNOSIS — I130 Hypertensive heart and chronic kidney disease with heart failure and stage 1 through stage 4 chronic kidney disease, or unspecified chronic kidney disease: Secondary | ICD-10-CM | POA: Diagnosis present

## 2016-11-24 DIAGNOSIS — J441 Chronic obstructive pulmonary disease with (acute) exacerbation: Secondary | ICD-10-CM | POA: Diagnosis present

## 2016-11-24 DIAGNOSIS — M1990 Unspecified osteoarthritis, unspecified site: Secondary | ICD-10-CM | POA: Diagnosis present

## 2016-11-24 DIAGNOSIS — K210 Gastro-esophageal reflux disease with esophagitis: Secondary | ICD-10-CM | POA: Diagnosis present

## 2016-11-24 DIAGNOSIS — Z23 Encounter for immunization: Secondary | ICD-10-CM | POA: Diagnosis present

## 2016-11-24 DIAGNOSIS — I878 Other specified disorders of veins: Secondary | ICD-10-CM | POA: Diagnosis present

## 2016-11-24 DIAGNOSIS — Z6837 Body mass index (BMI) 37.0-37.9, adult: Secondary | ICD-10-CM | POA: Diagnosis not present

## 2016-11-24 DIAGNOSIS — N184 Chronic kidney disease, stage 4 (severe): Secondary | ICD-10-CM | POA: Diagnosis present

## 2016-11-24 DIAGNOSIS — G4700 Insomnia, unspecified: Secondary | ICD-10-CM | POA: Diagnosis present

## 2016-11-24 DIAGNOSIS — K5900 Constipation, unspecified: Secondary | ICD-10-CM | POA: Diagnosis not present

## 2016-11-24 DIAGNOSIS — Z9981 Dependence on supplemental oxygen: Secondary | ICD-10-CM | POA: Diagnosis not present

## 2016-11-24 DIAGNOSIS — N271 Small kidney, bilateral: Secondary | ICD-10-CM | POA: Diagnosis present

## 2016-11-24 DIAGNOSIS — F329 Major depressive disorder, single episode, unspecified: Secondary | ICD-10-CM | POA: Diagnosis present

## 2016-11-24 DIAGNOSIS — N189 Chronic kidney disease, unspecified: Secondary | ICD-10-CM | POA: Diagnosis not present

## 2016-11-24 DIAGNOSIS — E1122 Type 2 diabetes mellitus with diabetic chronic kidney disease: Secondary | ICD-10-CM | POA: Diagnosis present

## 2016-11-24 DIAGNOSIS — N183 Chronic kidney disease, stage 3 (moderate): Secondary | ICD-10-CM | POA: Diagnosis not present

## 2016-11-24 DIAGNOSIS — J9621 Acute and chronic respiratory failure with hypoxia: Secondary | ICD-10-CM | POA: Diagnosis present

## 2016-11-24 DIAGNOSIS — N2581 Secondary hyperparathyroidism of renal origin: Secondary | ICD-10-CM | POA: Diagnosis present

## 2016-11-24 DIAGNOSIS — E875 Hyperkalemia: Secondary | ICD-10-CM | POA: Diagnosis present

## 2016-11-24 DIAGNOSIS — E785 Hyperlipidemia, unspecified: Secondary | ICD-10-CM | POA: Diagnosis present

## 2016-11-24 DIAGNOSIS — D631 Anemia in chronic kidney disease: Secondary | ICD-10-CM | POA: Diagnosis not present

## 2016-11-24 DIAGNOSIS — R0602 Shortness of breath: Secondary | ICD-10-CM | POA: Diagnosis present

## 2016-11-24 DIAGNOSIS — E669 Obesity, unspecified: Secondary | ICD-10-CM | POA: Diagnosis present

## 2016-11-24 DIAGNOSIS — I5033 Acute on chronic diastolic (congestive) heart failure: Secondary | ICD-10-CM | POA: Diagnosis not present

## 2016-11-24 DIAGNOSIS — E872 Acidosis: Secondary | ICD-10-CM | POA: Diagnosis present

## 2016-11-24 LAB — ECHOCARDIOGRAM COMPLETE
HEIGHTINCHES: 63 in
Weight: 3657.87 oz

## 2016-11-24 LAB — FOLATE: FOLATE: 33 ng/mL (ref >5.9)

## 2016-11-24 LAB — CBC WITH DIFFERENTIAL/PLATELET
BASOS PCT: 0 %
Basophils Absolute: 0 10*3/uL (ref 0.0–0.1)
EOS ABS: 0 10*3/uL (ref 0.0–0.7)
Eosinophils Relative: 0 %
HEMATOCRIT: 24.7 % — AB (ref 36.0–46.0)
Hemoglobin: 8 g/dL — ABNORMAL LOW (ref 12.0–15.0)
Lymphocytes Relative: 6 %
Lymphs Abs: 0.5 10*3/uL — ABNORMAL LOW (ref 0.7–4.0)
MCH: 29.3 pg (ref 26.0–34.0)
MCHC: 32.4 g/dL (ref 30.0–36.0)
MCV: 90.5 fL (ref 78.0–100.0)
MONO ABS: 0.4 10*3/uL (ref 0.1–1.0)
MONOS PCT: 5 %
NEUTROS ABS: 7 10*3/uL (ref 1.7–7.7)
Neutrophils Relative %: 89 %
Platelets: 114 10*3/uL — ABNORMAL LOW (ref 150–400)
RBC: 2.73 MIL/uL — ABNORMAL LOW (ref 3.87–5.11)
RDW: 19 % — AB (ref 11.5–15.5)
WBC: 8 10*3/uL (ref 4.0–10.5)

## 2016-11-24 LAB — IRON AND TIBC
IRON: 120 ug/dL (ref 28–170)
SATURATION RATIOS: 47 % — AB (ref 10.4–31.8)
TIBC: 255 ug/dL (ref 250–450)
UIBC: 135 ug/dL

## 2016-11-24 LAB — GLUCOSE, CAPILLARY
GLUCOSE-CAPILLARY: 123 mg/dL — AB (ref 65–99)
Glucose-Capillary: 77 mg/dL (ref 65–99)
Glucose-Capillary: 98 mg/dL (ref 65–99)
Glucose-Capillary: 151 mg/dL — ABNORMAL HIGH (ref 65–99)
Glucose-Capillary: 139 mg/dL — ABNORMAL HIGH (ref 65–99)

## 2016-11-24 LAB — BASIC METABOLIC PANEL
Anion gap: 9 (ref 5–15)
BUN: 102 mg/dL — ABNORMAL HIGH (ref 6–20)
CALCIUM: 7.9 mg/dL — AB (ref 8.9–10.3)
CO2: 18 mmol/L — AB (ref 22–32)
CREATININE: 2.66 mg/dL — AB (ref 0.44–1.00)
Chloride: 115 mmol/L — ABNORMAL HIGH (ref 101–111)
GFR calc Af Amer: 19 mL/min — ABNORMAL LOW (ref >60)
GFR calc non Af Amer: 16 mL/min — ABNORMAL LOW (ref >60)
GLUCOSE: 109 mg/dL — AB (ref 65–99)
Potassium: 5.3 mmol/L — ABNORMAL HIGH (ref 3.5–5.1)
Sodium: 142 mmol/L (ref 135–145)

## 2016-11-24 LAB — CREATININE, URINE, 24 HOUR: Creatinine, Urine: 41 mg/dL

## 2016-11-24 LAB — PHOSPHORUS: Phosphorus: 5.1 mg/dL — ABNORMAL HIGH (ref 2.5–4.6)

## 2016-11-24 LAB — RETICULOCYTES
RBC.: 2.67 MIL/uL — ABNORMAL LOW (ref 3.87–5.11)
Retic Count, Absolute: 56.1 10*3/uL (ref 19.0–186.0)
Retic Ct Pct: 2.1 % (ref 0.4–3.1)

## 2016-11-24 LAB — FERRITIN: Ferritin: 189 ng/mL (ref 11–307)

## 2016-11-24 LAB — VITAMIN B12: Vitamin B-12: 1696 pg/mL — ABNORMAL HIGH (ref 180–914)

## 2016-11-24 MED ORDER — LEVOFLOXACIN 750 MG PO TABS: 750.0000 mg | ORAL_TABLET | ORAL | Status: DC

## 2016-11-24 MED ORDER — FUROSEMIDE 10 MG/ML IJ SOLN
120.0000 mg | Freq: Every day | INTRAVENOUS | Status: DC
  Administered 2016-11-24 – 2016-11-25 (×2): 120 mg via INTRAVENOUS
  Filled 2016-11-24 (×2): qty 12

## 2016-11-24 NOTE — Consult Note (Addendum)
[facility] CONSULT NOTE    Date: 11/24/2016                  Patient Name:  [name]  MRN: [id number]  DOB: 1940/11/12  Age / Sex: 76 y.o., female         PCP: Burnard Bunting, MD                 Service Requesting Consult:  [facility] hospitalist                 Reason for Consult:  Acute on chronic kidney disease            History of Present Illness: Patient is a 76 y.o. female with a PMHx significant for type 2 diabetes, CKD 4, right renal cell carcinoma status post ablation 2011, hypertension, COPD on 3-4 L oxygen at home, OSA on CPAP, hyperlipidemia, GERD who was admitted to [facility] on 11/22/2016 for worsening dyspnea in the setting of patient was COPD exacerbation and mild exacerbation of chronic diastolic heart failure. Patient initially reported worsening shortness of breath which started today prior to admission as well as dry cough and some wheezing.  Patient use home albuterol with minimal relief of symptoms.  On admission, patient denies any systemic signs of infection such as fever or chills. Patient also reported lower extremity swelling not worse from her usual.  On admission to the ED, chest x-ray showed significant pulmonary edema, she was given Lasix 40 IV as well as breathing treatments.  Her admission potassium was 6 for which patient received Kayexalate.  Patient also had a non-gap metabolic acidosis.  On admission (11/13), creatinine was 2.32 which is close to baseline of 2.2.  BUN was 99 with no signs of confusion.  Calcium was 8.8, hemoglobin was stable at 9.2.  No leukocytosis.  Since admission, her creatinine has continued to worsen and is 2.66 this morning.  Potassium is 5.5, BUN 102, albumin is 3, calcium is 8.5 elevation in AST and ALT with normal bili.  Repeat chest x-ray from this morning (11/15) showed improvement in pulmonary edema.  Patient has been receiving Lasix 40 mg IV since admission, but with little UOP response past 2 days.  Patient  has received 2 rounds of Kayexalate.  Patient has maintained good blood pressure while inpatient, with no reported hypotensive episodes since her admission. Received losartan 100 mg yesterday, has since been discontinued.   Review of creatinines since 08/2014 2.2-2.66 (most recent baseline 2.2) GFR 19-22 so CKD 4 ostensibly since 08/2014.  Pt anemic, no Fe studies. PTH status not known. Pt stated never aware of CKD before (just of renal mass for which she underwent cryoablation in 2011). Has [facility] on HD so familiar with the process. Says she would do if kidneys were to fail.   Outpatient medications: Medications Prior to Admission  Medication Sig Dispense Refill Last Dose  . amLODipine (NORVASC) 5 MG tablet Take 10 mg by mouth daily.    11/21/2016 at Unknown time  . budesonide (PULMICORT) 0.25 MG/2ML nebulizer solution Take 2 mLs (0.25 mg total) by nebulization 2 (two) times daily. 60 mL 12 11/21/2016 at Unknown time  . Calcium Carbonate-Vitamin D (CALTRATE 600+D) 600-400 MG-UNIT per tablet Take 1 tablet by mouth daily.   11/21/2016 at Unknown time  . cyclobenzaprine (FLEXERIL) 10 MG tablet Take 10 mg 3 (three) times daily as needed by mouth for muscle spasms.   unk  . fluticasone (FLONASE) 50 MCG/ACT nasal  spray Place 2 sprays into both nostrils daily. 16 g 2 11/21/2016 at Unknown time  . folic acid (FOLVITE) 998 MCG tablet Take 400 mcg by mouth daily.   11/21/2016 at Unknown time  . glipiZIDE (GLUCOTROL XL) 10 MG 24 hr tablet Take 10 mg 2 (two) times daily by mouth.    11/21/2016 at Unknown time  . HUMALOG MIX 75/25 KWIKPEN (75-25) 100 UNIT/ML SUSP Inject 15 Units into the skin at bedtime.    11/21/2016 at Unknown time  . insulin glargine (LANTUS) 100 UNIT/ML injection Inject 30 Units into the skin daily.    11/21/2016 at Unknown time  . ipratropium-albuterol (DUONEB) 0.5-2.5 (3) MG/3ML SOLN Take 3 mLs by nebulization every 6 (six) hours as needed. (Patient taking differently: Take 3 mLs every 6  (six) hours as needed by nebulization (SOB). ) 360 mL 11 unk  . iron polysaccharides (NIFEREX) 150 MG capsule Take 150 mg by mouth 2 (two) times daily.   11/21/2016 at Unknown time  . LASIX 40 MG tablet Take 40 mg 2 (two) times daily by mouth.   9 11/21/2016 at Unknown time  . LORazepam (ATIVAN) 1 MG tablet Take 0.5-1 mg See admin instructions by mouth. Take 0.5 tablet every morning and 1 tablet in the evening as needed for sleep   11/21/2016 at Unknown time  . losartan (COZAAR) 100 MG tablet Take 100 mg daily by mouth.    11/21/2016 at Unknown time  . magnesium oxide (MAG-OX) 400 MG tablet Take 400 mg by mouth daily.   11/21/2016 at Unknown time  . methocarbamol (ROBAXIN) 500 MG tablet Take 500 mg 3 (three) times daily as needed by mouth for muscle spasms.   0 unk  . montelukast (SINGULAIR) 10 MG tablet Take 1 tablet (10 mg total) by mouth at bedtime. 30 tablet 5 11/21/2016 at Unknown time  . NEXIUM 40 MG capsule Take 1 capsule in morning by mouth   11/21/2016 at Unknown time  . Omega-3 Fatty Acids (FISH OIL) 1000 MG CAPS Take 1 capsule by mouth daily.   11/21/2016 at Unknown time  . potassium chloride SA (K-DUR,KLOR-CON) 20 MEQ tablet Take 20 mEq by mouth daily.   11/21/2016 at Unknown time  . prochlorperazine (COMPAZINE) 5 MG tablet Take 5 mg by mouth every 6 (six) hours as needed for nausea.   unk  . Propylene Glycol (SYSTANE BALANCE) 0.6 % SOLN Apply 1 drop to eye 2 (two) times daily.   11/21/2016 at Unknown time  . pyridOXINE (VITAMIN B-6) 100 MG tablet Take 100 mg by mouth daily.   11/21/2016 at Unknown time  . traMADol (ULTRAM) 50 MG tablet Take 50 mg by mouth every 6 (six) hours as needed for severe pain.   unk  . traZODone (DESYREL) 50 MG tablet Take 50 mg at bedtime as needed by mouth for sleep.   unk  . vitamin B-12 (CYANOCOBALAMIN) 1000 MCG tablet Take 1,000 mcg by mouth daily.   11/21/2016 at Unknown time  . Vitamin D, Ergocalciferol, (DRISDOL) 50000 UNITS CAPS capsule Take 50,000  Units by mouth 2 (two) times a week.  7 Past Week at Unknown time  . vitamin E 400 UNIT capsule Take 400 Units by mouth daily.   11/21/2016 at Unknown time  . ZOCOR 20 MG tablet Take 20 mg by mouth daily.  5 11/21/2016 at Unknown time  . augmented betamethasone dipropionate (DIPROLENE-AF) 0.05 % cream Apply 1 application topically 2 (two) times daily. To both legs   unknown  Current Facility-Administered Medications  Medication Dose Route Frequency Provider Last Rate Last Dose  . acetaminophen (TYLENOL) tablet 650 mg  650 mg Oral Q6H PRN Rondel Jumbo, PA-C       Or  . acetaminophen (TYLENOL) suppository 650 mg  650 mg Rectal Q6H PRN Rondel Jumbo, PA-C      . amLODipine (NORVASC) tablet 10 mg  10 mg Oral Daily Rondel Jumbo, PA-C   10 mg at 11/24/16 0846  . bisacodyl (DULCOLAX) suppository 10 mg  10 mg Rectal Daily PRN Rondel Jumbo, PA-C      . fluticasone (FLONASE) 50 MCG/ACT nasal spray 2 spray  2 spray Each Nare Daily Rondel Jumbo, PA-C   2 spray at 11/24/16 0847  . folic acid (FOLVITE) tablet 1 mg  1 mg Oral Daily Oretha Milch D, MD   1 mg at 11/24/16 0846  . guaiFENesin (MUCINEX) 12 hr tablet 600 mg  600 mg Oral BID PRN Rondel Jumbo, PA-C      . heparin injection 5,000 Units  5,000 Units Subcutaneous Q8H Rondel Jumbo, PA-C   5,000 Units at 11/24/16 0548  . hydrALAZINE (APRESOLINE) injection 10 mg  10 mg Intravenous Q6H PRN Oretha Milch D, MD      . HYDROcodone-acetaminophen (NORCO/VICODIN) 5-325 MG per tablet 1-2 tablet  1-2 tablet Oral Q4H PRN Rondel Jumbo, PA-C      . insulin aspart (novoLOG) injection 0-9 Units  0-9 Units Subcutaneous TID WC Rondel Jumbo, PA-C   3 Units at 11/23/16 1724  . insulin glargine (LANTUS) injection 30 Units  30 Units Subcutaneous Daily Rondel Jumbo, PA-C   30 Units at 11/24/16 8657  . ipratropium-albuterol (DUONEB) 0.5-2.5 (3) MG/3ML nebulizer solution 3 mL  3 mL Nebulization Q6H PRN Oretha Milch D, MD      . iron  polysaccharides (NIFEREX) capsule 150 mg  150 mg Oral BID Rondel Jumbo, PA-C   150 mg at 11/24/16 0845  . [START ON 11/25/2016] levofloxacin (LEVAQUIN) tablet 750 mg  750 mg Oral Q48H Nettey, Myrlene Broker D, MD      . LORazepam (ATIVAN) tablet 0.5 mg  0.5 mg Oral q morning - 10a Rondel Jumbo, PA-C   0.5 mg at 11/24/16 0845  . LORazepam (ATIVAN) tablet 1 mg  1 mg Oral QHS PRN Waldemar Dickens, MD      . magnesium oxide (MAG-OX) tablet 400 mg  400 mg Oral Daily Rondel Jumbo, PA-C   400 mg at 11/24/16 0845  . methocarbamol (ROBAXIN) tablet 500 mg  500 mg Oral TID PRN Rondel Jumbo, PA-C      . montelukast (SINGULAIR) tablet 10 mg  10 mg Oral QHS Rondel Jumbo, PA-C   10 mg at 11/23/16 2024  . omega-3 acid ethyl esters (LOVAZA) capsule 1 g  1 g Oral Daily Rondel Jumbo, PA-C   1 g at 11/24/16 0846  . ondansetron (ZOFRAN) tablet 4 mg  4 mg Oral Q6H PRN Rondel Jumbo, PA-C       Or  . ondansetron (ZOFRAN) injection 4 mg  4 mg Intravenous Q6H PRN Rondel Jumbo, PA-C      . pantoprazole (PROTONIX) EC tablet 40 mg  40 mg Oral Daily Rondel Jumbo, PA-C   40 mg at 11/24/16 0846  . polyvinyl alcohol (LIQUIFILM TEARS) 1.4 % ophthalmic solution 1 drop  1 drop Both Eyes BID Waldemar Dickens, MD   1  drop at 11/24/16 0847  . predniSONE (DELTASONE) tablet 40 mg  40 mg Oral Q breakfast Harvel Quale, RPH   40 mg at 11/24/16 0845  . prochlorperazine (COMPAZINE) tablet 5 mg  5 mg Oral Q6H PRN Rondel Jumbo, PA-C      . pyridOXINE (VITAMIN B-6) tablet 100 mg  100 mg Oral Daily Rondel Jumbo, PA-C   100 mg at 11/24/16 0846  . senna-docusate (Senokot-S) tablet 1 tablet  1 tablet Oral QHS PRN Rondel Jumbo, PA-C         Allergies  Allergen Reactions  . Other Other (See Comments)    "all generics make her sick"     Past Medical History:  Diagnosis Date  . Adenomatous colon polyp   . Anemia   . Arthritis   . Asthma   . COPD (chronic obstructive pulmonary disease) ([place])   . COPD with  asthma ([place]) 04/20/2011  . Depression   . Diabetes mellitus   . Diverticulosis   . Gastric ulcer   . GERD (gastroesophageal reflux disease)   . HTN (hypertension)   . Hypercholesterolemia   . Internal hemorrhoids   . Lumbar disc disease   . OSA (obstructive sleep apnea) 09/17/2012  . Peptic ulcer disease   . 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 Umbilical hernia   . Valgus foot    right     Past Surgical History:  Procedure Laterality Date  . COLONOSCOPY  2209 or 2010    2001, 2005, 2009  colonoscopy.  [place]  . COLONOSCOPY N/A 08/14/2012   Procedure: COLONOSCOPY;  Surgeon: Ladene Artist, MD;  Location: [facility] ENDOSCOPY;  Service: Endoscopy;  Laterality: N/A;  . INCISE AND DRAIN ABCESS     Abdominal Wall  . LIPOMA RESECTION     Right Neck  . TOTAL KNEE ARTHROPLASTY     Bilateral  . UMBILICAL HERNIA REPAIR  2007   incarcerated  . UPPER GASTROINTESTINAL ENDOSCOPY  2001   medoff Grade A esophagitis    Family History  Problem Relation Age of Onset  . Breast cancer Maternal Aunt     Social History   Socioeconomic History  . Marital status: Single    Spouse name: Not on file  . Number of children: 0  . Years of education: Not on file  . Highest education level: Not on file  Social Needs  . Financial resource strain: Not on file  . Food insecurity - worry: Not on file  . Food insecurity - inability: Not on file  . Transportation needs - medical: Not on file  . Transportation needs - non-medical: Not on file  Occupational History  . Occupation: Retired    Comment: [employer]  Tobacco Use  . Smoking status: Former Smoker    Packs/day: 2.00    Years: 5.00    Pack years: 10.00    Types: Cigarettes    Last attempt to quit: 01/10/1958    Years since quitting: 58.9  . Smokeless tobacco: Never Used  Substance and Sexual Activity  . Alcohol use: No    Comment: Quit when she quit smoking  . Drug use: No  . Sexual activity: No  Other Topics Concern  . Not on  file  Social History Narrative   Retired from [employer] no children   Sister in [place]: As per HPI  Vital Signs: Blood pressure (!) 132/52, pulse 64, temperature (!)  94.3 F (34.6 C), temperature source Rectal, resp. rate 18, height 5\' 3"  (1.6 m), weight 103.7 kg (228 lb 9.9 oz), SpO2 100 %.  Weight trends: Filed Weights   11/23/16 0500  Weight: 103.7 kg (228 lb 9.9 oz)    Physical Exam: General:  Well-developed, well-nourished, in no acute distress; alert, appropriate and cooperative throughout examination.  Head: Normocephalic, atraumatic.  Eyes: PERRL, EOMI, No signs of anemia or jaundince.  Nose: Mucous membranes moist, not inflammed, nonerythematous.  Throat: Oropharynx nonerythematous, no exudate appreciated.   Neck: No deformities, masses, or tenderness noted.Supple, No carotid Bruits, no JVD.  Lungs:  Normal respiratory effort. Bibasilar crackles noted on exam with some end expiratory  wheezes.  Heart: RRR. S1 and S2 normal without gallop, systolic murmur 2/6 upper sternal border and 2nd 1/6 apical systolic murmur. No pericardial rub.   Abdomen:  BS normoactive. Soft, Nondistended, non-tender.  No masses or organomegaly.  Extremities: Significant woody LE skin changes with hyperpigmentation to her knee and hyperkeratotic skin with multiple papules noted. No skin breakdown or significant pitting edema noted. Pitting edema of thighs and lateral abd wall  Neurologic: A&O X3, CN II - XII are grossly intact. Motor strength is 5/5 in the all 4 extremities, Sensations intact to light touch, Cerebellar signs negative. No asterixis  Skin: No visible rashes, scars.  GU: Wearing female urinary continence device, hooked to wall suction, minimal urine  Lab results: Basic Metabolic Panel: Recent Labs  Lab 11/22/16 1835 11/23/16 0725 11/24/16 0746  NA 142 144 142  K 5.1 5.5* 5.3*  CL 114* 117* 115*  CO2 17* 19* 18*  GLUCOSE 134* 90 109*  BUN  94* 97* 102*  CREATININE 2.32* 2.48* 2.66*  CALCIUM 8.5* 8.5* 7.9*    Recent Labs  Lab 11/23/16 0725  AST 43*  ALT 101*  ALKPHOS 119  BILITOT 0.6  PROT 6.9  ALBUMIN 3.0*    Recent Labs  Lab 11/22/16 0405 11/22/16 0541 11/23/16 0725 11/24/16 0746  WBC 1.9* 6.2 8.1 8.0  NEUTROABS  --  4.4  --  7.0  HGB 17.6* 9.2* 8.9* 8.0*  HCT 52.7* 28.0* 27.5* 24.7*  MCV 92.9 92.4 91.4 90.5  PLT PLATELET CLUMPS NOTED ON SMEAR, UNABLE TO ESTIMATE 139* 122* 114*     Recent Labs  Lab 11/23/16 1225 11/23/16 1704 11/23/16 2054 11/24/16 0610 11/24/16 0818  GLUCAP 147* 225* 189* 77 48    Microbiology: Results for orders placed or performed during the hospital encounter of 11/22/16  Respiratory Panel by PCR     Status: None   Collection Time: 11/23/16  2:10 PM  Result Value Ref Range Status   Adenovirus NOT DETECTED NOT DETECTED Final   Coronavirus 229E NOT DETECTED NOT DETECTED Final   Coronavirus HKU1 NOT DETECTED NOT DETECTED Final   Coronavirus NL63 NOT DETECTED NOT DETECTED Final   Coronavirus OC43 NOT DETECTED NOT DETECTED Final   Metapneumovirus NOT DETECTED NOT DETECTED Final   Rhinovirus / Enterovirus NOT DETECTED NOT DETECTED Final   Influenza A NOT DETECTED NOT DETECTED Final   Influenza B NOT DETECTED NOT DETECTED Final   Parainfluenza Virus 1 NOT DETECTED NOT DETECTED Final   Parainfluenza Virus 2 NOT DETECTED NOT DETECTED Final   Parainfluenza Virus 3 NOT DETECTED NOT DETECTED Final   Parainfluenza Virus 4 NOT DETECTED NOT DETECTED Final   Respiratory Syncytial Virus NOT DETECTED NOT DETECTED Final   Bordetella pertussis NOT DETECTED NOT DETECTED Final   Chlamydophila pneumoniae NOT DETECTED  NOT DETECTED Final   Mycoplasma pneumoniae NOT DETECTED NOT DETECTED Final    Coagulation Studies: Recent Labs    11/23/16 0725  LABPROT 15.4*  INR 1.23    Urinalysis: Recent Labs    11/22/16 1643  COLORURINE STRAW*  LABSPEC 1.008  PHURINE 5.0  GLUCOSEU  NEGATIVE  HGBUR NEGATIVE  BILIRUBINUR NEGATIVE  KETONESUR NEGATIVE  PROTEINUR NEGATIVE  NITRITE NEGATIVE  LEUKOCYTESUR TRACE*      Imaging:  Dg Chest 2 View  Result Date: 11/23/2016 CLINICAL DATA:  Shortness of breath.  COPD. EXAM: CHEST  2 VIEW COMPARISON:  11/22/2016 FINDINGS: There is moderate cardiac enlargement. Aortic atherosclerosis noted. Pulmonary edema pattern has improved from the previous exam. Small pleural effusions are unchanged. IMPRESSION: 1. Improvement in pulmonary edema pattern. Electronically Signed   By: Kerby Moors M.D.   On: 11/23/2016 09:34   US Renal  Result Date: 11/24/2016 CLINICAL DATA:  Acute renal failure superimposed on stage 3 chronic kidney disease. EXAM: RENAL / URINARY TRACT ULTRASOUND COMPLETE COMPARISON:  CT renal protocol dated October 30, 2013. FINDINGS: Right Kidney: Length: 8.8 cm. Increased echogenicity. No mass or hydronephrosis visualized. Two small exophytic simple cysts measuring up to 0.9 cm are noted. Left Kidney: Length: 8.6 cm. Increased echogenicity. No mass or hydronephrosis visualized. A 1.3 cm echogenic lesion within the lower pole of the left kidney likely corresponds to a proteinaceous or calcified cyst, stable since 2014. Bladder: Appears normal for degree of bladder distention. IMPRESSION: 1. Increased renal echogenicity bilaterally, consistent with medical renal disease. No hydronephrosis. Electronically Signed   By: Titus Dubin M.D.   On: 11/24/2016 13:09    Assessment & Plan: Pt is a 76 y.o. yo female with a PMHX significant for type 2 diabetes, CKD 4, right renal cell carcinoma status post ablation in 2011, hypertension, COPD on 3-4 L oxygen at home, OSA on CPAP, hyperlipidemia, GERD who was admitted to [facility] on 11/22/2016 for worsening dyspnea in the setting COPD exacerbation and mild exacerbation of chronic diastolic heart failure.  #AKI on CKD4  Patient presented with slightly elevated creatinine of 2.32 from baseline  2.2 with elevated BUN and hyperkalemia requiring Kayexalate on admission. Patient also had a non-gap metabolic acidosis . Initial concern for COPD exacerbation in combination with mild diastolic CHF exacerbation.  On admission, patient had a mildly elevated  BNP (unreliable given obesity) with initial chest x-ray showing significant pulmonary edema and bilateral crackles on exam. Patient received multiple breathing treatment as well as steroids and 40 mg IV Lasix.  Patient was also placed on home 3 L nasal cannula. Symptoms improved with breathing treatments and steroids (Solu-Medrol). Patient was on Levaquin for 1 day. Since admission creatinine has slowly increased to 2.6 this morning. Patient received 1 dose of losartan yesterday prior to discontinuation. Patient had poor urine output for the past 24 hours after receiving 5 doses of 40 mg IV Lasix.Patient does have elevated BUN but shows no signs of altered mental status, confusion or asterixis on exam.  This morning, potassium is 5.3 and calcium is 7.9 with some degree of hypoalbuminemia. Hemoglobin is 8.0 down from 8.9 yesterday.  Likely anemia of chronic disease in the setting of CKD 4.  Renal ultrasound did not show any signs of obstruction or hydronephrosis, however there is increased echogenicity consistent with progression of medical renal disease.  Initial non-gap metabolic acidosis likely secondary to CO2 retention in the setting of chronic obstructive lung disease. Slight worsening in kidney function likely caused by  continuation of ARB as well as diastolic CHF with poor diuresis.  There also could be could be an element of progression of medical renal disease in the setting of diabetes and hypertension. Nephrology team will recommend more aggressive diuresis with increase furosemide dose.  Nephrology team discussed with patient possible need for dialysis in the future, patient seems to be agreeable to that possibility.  Patient already has family  members (godmother) on dialysis.  Patient will need further education and will need to follow-up with outpatient nephrology.  --Continue to hold losartan and avoid nephrotoxic agent --Ordered 2 doses of Lasix 120 mg IV, titrate as needed based on UOP --Strict I's and O's, daily weight, monitor volume status --We will order anemia panel given Hb 8 --Follow-up on PTH and phosphorus level --Switch from carb modified to renal/carb modified diet --Follow-up on morning kidney labs --Provide patient with dialysis education --SPEP/UPEP since no prior renal workup  #Diastolic CHF, exacerbation Patient admitted with concern of volume overload with echo and exam pitting edema up to mid thigh.  Chest x-ray was consistent with significant pulmonary edema.  Patient was on Lasix 40 mg p.o. at home with minimal urine output.  Since admission patient has been given multiple dose of IV Lasix 40 mg and continues to have poor urine output however repeat chest x-ray showed mild improvement in pulmonary edema.   -  We will increase Lasix dose to 120 IV and monitor volume output.  Expect improvement in shortness of breath as well as edema with increase diuresis. --Could consider heart failure team consult, primary team is managing  #Acute respiratory failure, improving Patient is currently on 2 L nasal cannula with slightly increased work of breathing and expiratory wheezing.  Patient is currently receiving breathing treatment in addition to prednisone.  Antibiotic has been stopped.  Patient is on CPAP at night.  Expect improvement of breathing status with increased diuresis.  Chest x-ray slowly showing improvement in pulmonary edema.  Management as above.  #Hypertension Since admission patient has been on [id number] systolic blood pressure with one episode of 160 but has been essentially normotensive.  There was no signs of hypotension since admission which would be concerning for hypoperfusion of renal system.  Patient  on losartan, and amlodipine at home.  Given AKI in the setting of CKD 4 we will discontinue ARB.  Continue with amlodipine however would monitor blood pressure to ensure good kidney perfusion.    --Continue to hold losartan (and with advanced stage of CKD would not recommend resuming) --Continue amlodipine as needed --Monitor blood pressure  #Anemia On admission hemoglobin was 9.2, consistent with anemia of chronic disease in the setting of CKD 4, down to 8's today.  Patient may need iron supplementation going forward that she sees nephrology in the outpatient setting.  No current indication for blood transfusion, however patient has significant cardiac history with a transfusion threshold of 8.  --Follow-up on anemia panel  DVT PPX -per primary team  Dr. Marjie Skiff Resident 11/24/2016  I have seen and examined this patient and agree with plan and assessment in the above note of Dr. Andy Gauss with renal recommendations/interventions highlighted. Elderly AAF with DM, HTN, CKD4 at baseline (since 2016), severe OSA, COPD, anemia. Admitted with SOB, COPD exac + HF/pulm edema despite nebs and usual home lasix 80 mg PTA. Managed w/steroids, nebs, small doses of IV lasix.  Asked to see d/t rising creatinine. On ARB PTA, rec'd dose yesterday. No NSAIDS. Prev renal hx sig for  cryoablation of RCCA 2011. Current exam notable for mild ^ WOB, basilar crackles on lung exam, chronic wood changes LE's below knees but with pitting edema of thighs and lat abd wall. No asterixis. Creatinine 2.2->2.6 since admission (getting 40 mg doses IV lasix, minimal diuresis), BUN around 100. Persistent mild K elev despite stopping losartan and 2 doses SPSS. Bilaterally small kidneys on [place], no proteinuria on dipstick. 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 Recommendations include escalating diuretic program/reassessing diuretic needs, anemia evaluation with IV Fe/Aranesp as indicated, sodium bicarbonate po for  metabolic acidosis, renal K restricted diet, CKD education. Has some knowledge of dialysis (God mother dialyzes at [facility]) and indicates would do HD if kidneys fail. With advanced CKD would not resume ARB.   Dr. Joelyn Oms will followup in the AM  Jamal Maes, MD [facility] [phone] Pager 11/24/2016, 6:05 PM

## 2016-11-24 NOTE — Progress Notes (Signed)
PROGRESS NOTE  [name] UTM:[id number] DOB: 1940-03-28 DOA: 11/22/2016 PCP: Burnard Bunting, MD  HPI/Recap of past 24 hours:  [name] is a 76 y.o. year old female with medical history significant for type 2 diabetes, CKD stage III s/p right renal carcinoma ( s/p cryoablation 07/2009), hypertension, COPD, obstructive sleep apnea on CPAP, hyperlipidemia who presented on 11/22/2016 with worsening dyspnea and was found to have acute on chronic respiratory failure presumed secondary to combined COPD exacerbation and acute exacerbation of chronic diastolic heart failure.  In the past 24 hours, patient reports some minimal improvement in breathing. Having cough but non-productive.  Denies fevers, chills, abdominal pain, chest pain.    Assessment/Plan: Active Problems:   COPD with asthma (HCC)   Allergic rhinitis   Anemia   Osteoarthritis   OSA (obstructive sleep apnea)   Insomnia   COPD exacerbation (HCC)   Acute exacerbation of CHF (congestive heart failure) (HCC)   Acute on chronic respiratory failure with hypoxia (HCC)   Acute renal failure superimposed on stage 3 chronic kidney disease (HCC)   Metabolic acidosis with normal anion gap and bicarbonate losses  1.AKI on CKD, likely secondary to CHF exacerbation, worsening  Hyperkalemia Non Anion Gap Metabolic Acidosis -Persistent hyperkalemia, worsening non-anion gap metabolic acidosis, elevated BUN (102) concerning for progression of CKD.  Has not improved with  aggressive IV diuresis and discontinuation of losartan. No obvious nephrotoxins -History of renal carcinoma s/p cryoablation in 2011 -Status post Kayexalate x2 during admission - renal u/s, Fe Urea - Nephrology consulted -Serial BMPs, strict intake and output   2.Acute on chronic respiratory failure secondary to #3 and 4.  Multifactorial (COPD and CHF exacerbation).  At home oxygen requirement at rest, but unsure of requirements when mobile. -Continue  supplemental oxygen, walking oxygen test - Continue inhaler regimen  3.Acute exacerbation of chronic diastolic heart failure, hypervolemic.  TTE on 2015 with preserved EF.  Chest x-ray on admission with significant interstitial edema, right-sided pleural effusion and cardiomegaly.  BNP 75 however unhelpful in setting of obesity -f/u repeat TTE, continue IV Lasix (40 mg twice daily) diuresis -Daily weights, strict intake and output   4.COPD exacerbation.  Improving.  Unclear etiology denies any recent sick contacts.  Reports adherence with medication regimen.  Possibly related to CHF exacerbation -PRN duo nebs, p.o. Levaquin, Oral prednisone, Mucinex, Singulair  5.Hypertension.  Improved control -Home amlodipine -Holding home losartan in setting of persistent hyperkalemia and AK I -PRN IV hydralazine for SBP greater than 170  6.Type 2 diabetes, controlled.  A1c 6.7 (11/22/2016) -Home Lantus 30 units, NovoLog correction coverage  7.GERD.  Stable -Home Protonix  Code Status: Full code  Family Communication: No family at bedside  Disposition Plan: IV diuresis, acute renal failure workup    Consultants:  None  Procedures:  None  Antimicrobials:  Levaquin 11/22/2016-current  Cultures:  None  DVT prophylaxis: Heparin   Objective: Vitals:   11/24/16 0450 11/24/16 0556 11/24/16 0800 11/24/16 0901  BP:   (!) 132/52   Pulse:   64   Resp:      Temp: (!) 93.8 F (34.3 C) (!) 93.9 F (34.4 C)  (!) 94.3 F (34.6 C)  TempSrc: Rectal Rectal  Rectal  SpO2:   100%   Weight:      Height:        Intake/Output Summary (Last 24 hours) at 11/24/2016 1116 Last data filed at 11/24/2016 1037 Gross per 24 hour  Intake 240 ml  Output 400  ml  Net -160 ml   Filed Weights   11/23/16 0500  Weight: 103.7 kg (228 lb 9.9 oz)    Exam:   General: Lying in bed, in no apparent distress  Cardiovascular: no pitting edema, no appreciable JVD,   Respiratory: Crackles at bases  into the mid lung bilaterally, no appreciable wheezes  Abdomen: Soft, nondistended  Musculoskeletal: Normal range of motion  Skin: Chronic skin changes on bilateral lower extremities from ankle to below knee: Darkly hyperpigmented skin.  Feet consistent with charcot  Neurologic: Alert and oriented x3   Psychiatry: Appropriate affect and mood   Data Reviewed: CBC: Recent Labs  Lab 11/22/16 0405 11/22/16 0541 11/23/16 0725 11/24/16 0746  WBC 1.9* 6.2 8.1 8.0  NEUTROABS  --  4.4  --  7.0  HGB 17.6* 9.2* 8.9* 8.0*  HCT 52.7* 28.0* 27.5* 24.7*  MCV 92.9 92.4 91.4 90.5  PLT PLATELET CLUMPS NOTED ON SMEAR, UNABLE TO ESTIMATE 139* 122* 361*   Basic Metabolic Panel: Recent Labs  Lab 11/22/16 0405 11/22/16 0541 11/22/16 1835 11/23/16 0725 11/24/16 0746  NA 141 141 142 144 142  K 6.0* 6.0* 5.1 5.5* 5.3*  CL 117* 115* 114* 117* 115*  CO2 20* 20* 17* 19* 18*  GLUCOSE 148* 130* 134* 90 109*  BUN 99* 98* 94* 97* 102*  CREATININE 2.32* 2.31* 2.32* 2.48* 2.66*  CALCIUM 8.7* 8.8* 8.5* 8.5* 7.9*   GFR: Estimated Creatinine Clearance: 21 mL/min (A) (by C-G formula based on SCr of 2.66 mg/dL (H)). Liver Function Tests: Recent Labs  Lab 11/23/16 0725  AST 43*  ALT 101*  ALKPHOS 119  BILITOT 0.6  PROT 6.9  ALBUMIN 3.0*   No results for input(s): LIPASE, AMYLASE in the last 168 hours. No results for input(s): AMMONIA in the last 168 hours. Coagulation Profile: Recent Labs  Lab 11/23/16 0725  INR 1.23   Cardiac Enzymes: No results for input(s): CKTOTAL, CKMB, CKMBINDEX, TROPONINI in the last 168 hours. BNP (last 3 results) No results for input(s): PROBNP in the last 8760 hours. HbA1C: Recent Labs    11/22/16 1037  HGBA1C 6.7*   CBG: Recent Labs  Lab 11/23/16 1225 11/23/16 1704 11/23/16 2054 11/24/16 0610 11/24/16 0818  GLUCAP 147* 225* 189* 77 98   Lipid Profile: No results for input(s): CHOL, HDL, LDLCALC, TRIG, CHOLHDL, LDLDIRECT in the last 72  hours. Thyroid Function Tests: No results for input(s): TSH, T4TOTAL, FREET4, T3FREE, THYROIDAB in the last 72 hours. Anemia Panel: No results for input(s): VITAMINB12, FOLATE, FERRITIN, TIBC, IRON, RETICCTPCT in the last 72 hours. Urine analysis:    Component Value Date/Time   COLORURINE STRAW (A) 11/22/2016 1643   APPEARANCEUR CLEAR 11/22/2016 1643   LABSPEC 1.008 11/22/2016 1643   PHURINE 5.0 11/22/2016 1643   GLUCOSEU NEGATIVE 11/22/2016 1643   HGBUR NEGATIVE 11/22/2016 1643   BILIRUBINUR NEGATIVE 11/22/2016 1643   KETONESUR NEGATIVE 11/22/2016 1643   PROTEINUR NEGATIVE 11/22/2016 1643   UROBILINOGEN 0.2 12/16/2011 1717   NITRITE NEGATIVE 11/22/2016 1643   LEUKOCYTESUR TRACE (A) 11/22/2016 1643   Sepsis Labs: @LABRCNTIP (procalcitonin:4,lacticidven:4)  ) Recent Results (from the past 240 hour(s))  Respiratory Panel by PCR     Status: None   Collection Time: 11/23/16  2:10 PM  Result Value Ref Range Status   Adenovirus NOT DETECTED NOT DETECTED Final   Coronavirus 229E NOT DETECTED NOT DETECTED Final   Coronavirus HKU1 NOT DETECTED NOT DETECTED Final   Coronavirus NL63 NOT DETECTED NOT DETECTED Final  Coronavirus OC43 NOT DETECTED NOT DETECTED Final   Metapneumovirus NOT DETECTED NOT DETECTED Final   Rhinovirus / Enterovirus NOT DETECTED NOT DETECTED Final   Influenza A NOT DETECTED NOT DETECTED Final   Influenza B NOT DETECTED NOT DETECTED Final   Parainfluenza Virus 1 NOT DETECTED NOT DETECTED Final   Parainfluenza Virus 2 NOT DETECTED NOT DETECTED Final   Parainfluenza Virus 3 NOT DETECTED NOT DETECTED Final   Parainfluenza Virus 4 NOT DETECTED NOT DETECTED Final   Respiratory Syncytial Virus NOT DETECTED NOT DETECTED Final   Bordetella pertussis NOT DETECTED NOT DETECTED Final   Chlamydophila pneumoniae NOT DETECTED NOT DETECTED Final   Mycoplasma pneumoniae NOT DETECTED NOT DETECTED Final      Studies: No results found.  Scheduled Meds: . amLODipine  10 mg  Oral Daily  . fluticasone  2 spray Each Nare Daily  . folic acid  1 mg Oral Daily  . heparin  5,000 Units Subcutaneous Q8H  . insulin aspart  0-9 Units Subcutaneous TID WC  . insulin glargine  30 Units Subcutaneous Daily  . iron polysaccharides  150 mg Oral BID  . [START ON 11/25/2016] levofloxacin  750 mg Oral Q48H  . LORazepam  0.5 mg Oral q morning - 10a  . magnesium oxide  400 mg Oral Daily  . montelukast  10 mg Oral QHS  . omega-3 acid ethyl esters  1 g Oral Daily  . pantoprazole  40 mg Oral Daily  . polyvinyl alcohol  1 drop Both Eyes BID  . predniSONE  40 mg Oral Q breakfast  . pyridOXINE  100 mg Oral Daily    Continuous Infusions:    LOS: 0 days     Desiree Hane, MD [facility] Pager [phone]  If 7PM-7AM, please contact night-coverage [email or web address] Password [email or web address] 11/24/2016, 11:16 AM

## 2016-11-24 NOTE — Progress Notes (Signed)
Echocardiogram 2D Echocardiogram has been performed.  [name] 11/24/2016, 9:43 AM

## 2016-11-24 NOTE — Progress Notes (Signed)
0450-Was unable to obtain an oral/axillary temp. Rectal temp checked and was 93.8-MD notified. Heat turned up in pt's room and warm blankets applied. MD order to recheck pt's temp in an hour and replace the blankets in 30 min. Will continue to monitor and assess pt situation.   0556: pt's temp increased slightly to 93.9-MD notified and wanted the pt's BS to be checked. BS is 77-MD made aware. He does not want to move the pt to a higher level of care at this point, he would like [place] to continue alternating the warm blankets and monitor pt's status. Will update oncoming RN.

## 2016-11-25 DIAGNOSIS — N189 Chronic kidney disease, unspecified: Secondary | ICD-10-CM

## 2016-11-25 DIAGNOSIS — R0602 Shortness of breath: Secondary | ICD-10-CM

## 2016-11-25 DIAGNOSIS — D631 Anemia in chronic kidney disease: Secondary | ICD-10-CM

## 2016-11-25 LAB — RENAL FUNCTION PANEL
ALBUMIN: 2.7 g/dL — AB (ref 3.5–5.0)
Anion gap: 6 (ref 5–15)
BUN: 112 mg/dL — AB (ref 6–20)
CO2: 22 mmol/L (ref 22–32)
Calcium: 7.6 mg/dL — ABNORMAL LOW (ref 8.9–10.3)
Chloride: 111 mmol/L (ref 101–111)
Creatinine, Ser: 3.11 mg/dL — ABNORMAL HIGH (ref 0.44–1.00)
GFR calc Af Amer: 16 mL/min — ABNORMAL LOW (ref >60)
GFR calc non Af Amer: 14 mL/min — ABNORMAL LOW (ref >60)
GLUCOSE: 108 mg/dL — AB (ref 65–99)
PHOSPHORUS: 5.4 mg/dL — AB (ref 2.5–4.6)
Potassium: 4.9 mmol/L (ref 3.5–5.1)
SODIUM: 139 mmol/L (ref 135–145)

## 2016-11-25 LAB — CBC
HEMATOCRIT: 24.1 % — AB (ref 36.0–46.0)
Hemoglobin: 7.8 g/dL — ABNORMAL LOW (ref 12.0–15.0)
MCH: 29.5 pg (ref 26.0–34.0)
MCHC: 32.4 g/dL (ref 30.0–36.0)
MCV: 91.3 fL (ref 78.0–100.0)
Platelets: 119 10*3/uL — ABNORMAL LOW (ref 150–400)
RBC: 2.64 MIL/uL — ABNORMAL LOW (ref 3.87–5.11)
RDW: 19 % — AB (ref 11.5–15.5)
WBC: 6.6 10*3/uL (ref 4.0–10.5)

## 2016-11-25 LAB — GLUCOSE, CAPILLARY
GLUCOSE-CAPILLARY: 77 mg/dL (ref 65–99)
GLUCOSE-CAPILLARY: 78 mg/dL (ref 65–99)
Glucose-Capillary: 91 mg/dL (ref 65–99)
Glucose-Capillary: 165 mg/dL — ABNORMAL HIGH (ref 65–99)
Glucose-Capillary: 210 mg/dL — ABNORMAL HIGH (ref 65–99)

## 2016-11-25 LAB — PARATHYROID HORMONE, INTACT (NO CA): PTH: 118 pg/mL — ABNORMAL HIGH (ref 15–65)

## 2016-11-25 LAB — UREA NITROGEN, URINE: Urea Nitrogen, Ur: 372 mg/dL

## 2016-11-25 MED ORDER — FUROSEMIDE 10 MG/ML IJ SOLN
120.0000 mg | Freq: Three times a day (TID) | INTRAVENOUS | Status: AC
  Administered 2016-11-25 – 2016-11-29 (×14): 120 mg via INTRAVENOUS
  Filled 2016-11-25: qty 10
  Filled 2016-11-25 (×2): qty 12
  Filled 2016-11-25: qty 10
  Filled 2016-11-25 (×2): qty 12
  Filled 2016-11-25: qty 10
  Filled 2016-11-25: qty 12
  Filled 2016-11-25 (×3): qty 10
  Filled 2016-11-25 (×2): qty 12
  Filled 2016-11-25 (×2): qty 10

## 2016-11-25 MED ORDER — SODIUM BICARBONATE 650 MG PO TABS
650.0000 mg | ORAL_TABLET | Freq: Three times a day (TID) | ORAL | Status: DC
  Administered 2016-11-25 – 2016-11-30 (×16): 650 mg via ORAL
  Filled 2016-11-25 (×16): qty 1

## 2016-11-25 MED ORDER — LEVOFLOXACIN 500 MG PO TABS
500.0000 mg | ORAL_TABLET | ORAL | Status: AC
  Administered 2016-11-25 – 2016-11-27 (×2): 500 mg via ORAL
  Filled 2016-11-25 (×3): qty 1

## 2016-11-25 MED ORDER — DARBEPOETIN ALFA 60 MCG/0.3ML IJ SOSY
60.0000 ug | PREFILLED_SYRINGE | INTRAMUSCULAR | Status: DC
  Administered 2016-11-25: 60 ug via SUBCUTANEOUS
  Filled 2016-11-25 (×2): qty 0.3

## 2016-11-25 NOTE — Progress Notes (Addendum)
PROGRESS NOTE  [name] YKD:[id number] DOB: 1940-08-17 DOA: 11/22/2016 PCP: Burnard Bunting, MD  HPI/Recap of past 24 hours:  [name] is a 76 y.o. year old female with medical history significant for type 2 diabetes, CKD stage III s/p right renal carcinoma ( s/p cryoablation 07/2009), hypertension, COPD, obstructive sleep apnea on CPAP, hyperlipidemia who presented on 11/22/2016 with worsening dyspnea and was found to have acute on chronic respiratory failure presumed secondary to combined COPD exacerbation and acute exacerbation of chronic diastolic heart failure.  This morning patient reports improvement in respiratory status.. Having cough but non-productive.  Denies fevers, chills, abdominal pain, chest pain.    Assessment/Plan: Active Problems:   COPD with asthma (HCC)   Allergic rhinitis   Anemia   Osteoarthritis   OSA (obstructive sleep apnea)   Insomnia   COPD exacerbation (HCC)   Acute exacerbation of CHF (congestive heart failure) (HCC)   Acute on chronic respiratory failure with hypoxia (HCC)   Acute renal failure superimposed on stage 3 chronic kidney disease (HCC)   Metabolic acidosis with normal anion gap and bicarbonate losses  1.AKI on CKD, likely secondary to CHF exacerbation, worsening  Hyperkalemia, improving Non Anion Gap Metabolic Acidosis, improving Concerned ay be progression of underlying CKD Status post Kayexalate x2 during admission  Renal ultrasound-consistent with medical renal disease Fe Urea Nephrology consulted: sodium bicarb, aggressive IV diuresis, started aranesp Monitor BMPs, strict intake and output, avoiding nephrotoxins   2.Acute on chronic respiratory failure secondary, stable  Multifactorial (COPD, CHF exacerbation, ARF).  At home oxygen requirement at rest, but unsure of requirements when mobile. -Continue supplemental oxygen, - Continue inhaler regimen --Aggressive diuresis as mentioned above  3.Acute exacerbation of  chronic diastolic heart failure, hypervolemic.  TTE  shows preserved EF, grad 2 diastolic function.  Chest x-ray on admission with significant interstitial edema, right-sided pleural effusion and cardiomegaly.  BNP 75 however unhelpful in setting of obesity -f/u repeat TTE, continue IV Lasix (40 mg twice daily) diuresis -Daily weights, strict intake and output   4.COPD exacerbation.  Improving.  Unclear etiology denies any recent sick contacts.  Reports adherence with medication regimen.  Possibly related to CHF exacerbation -PRN duo nebs, p.o. Levaquin, Oral prednisone, Mucinex, Singulair  5.Hypertension.  Improved control -Home amlodipine -Holding home losartan in setting of persistent hyperkalemia and AK I -PRN IV hydralazine for SBP greater than 170  6.Type 2 diabetes, controlled.  A1c 6.7 (11/22/2016) -Home Lantus 30 units, NovoLog correction coverage  7.GERD.  Stable -Home Protonix   8. OSA on CPAP . Stable Could be contributing to increased PA pressures seen on TTE. Patient non-adherent with BIPAP at home  9. Anemia of CKD Starting aranesp  Code Status: Full code  Family Communication: No family at bedside  Disposition Plan: IV diuresis, acute renal failure workup    Consultants:  None  Procedures:  TTE 11: EF 60-65 %, wall motion normal, grade 2 diastolic dysfunction, mild aortic stenosis, moderate-severe mitral regurgitation. Severely dilated left and right atrium. Pulmonary arteries severely increased to 72 mmHg  Antimicrobials:  Levaquin 11/22/2016-current  Cultures:  None  DVT prophylaxis: Heparin   Objective: Vitals:   11/24/16 1430 11/24/16 2145 11/25/16 0501 11/25/16 0923  BP: (!) 142/55 (!) 144/56 138/62 135/73  Pulse: 69 62 72   Resp: 18 18 18    Temp: 98 F (36.7 C) (!) 94.7 F (34.8 C) (!) 94.6 F (34.8 C)   TempSrc: Oral Rectal    SpO2: 100% 100% 100%  Weight:      Height:        Intake/Output Summary (Last 24 hours) at  11/25/2016 1142 Last data filed at 11/25/2016 0605 Gross per 24 hour  Intake 542 ml  Output 800 ml  Net -258 ml   Filed Weights   11/23/16 0500  Weight: 103.7 kg (228 lb 9.9 oz)    Exam:   General: Lying in bed, in no apparent distress  Cardiovascular: pitting edema of abdominal wall and thighs, systolic murmur soft loudest in RUSB, no radiation   Respiratory: , no appreciable wheezes, on supplemental oxygen  Abdomen: Soft, nondistended  Skin: Chronic skin changes on bilateral lower extremities from ankle to below knee: Darkly hyperpigmented skin.  Feet consistent with charcot  Neurologic: Alert and oriented x3   Psychiatry: Appropriate affect and mood   Data Reviewed: CBC: Recent Labs  Lab 11/22/16 0405 11/22/16 0541 11/23/16 0725 11/24/16 0746 11/25/16 0410  WBC 1.9* 6.2 8.1 8.0 6.6  NEUTROABS  --  4.4  --  7.0  --   HGB 17.6* 9.2* 8.9* 8.0* 7.8*  HCT 52.7* 28.0* 27.5* 24.7* 24.1*  MCV 92.9 92.4 91.4 90.5 91.3  PLT PLATELET CLUMPS NOTED ON SMEAR, UNABLE TO ESTIMATE 139* 122* 114* 885*   Basic Metabolic Panel: Recent Labs  Lab 11/22/16 0541 11/22/16 1835 11/23/16 0725 11/24/16 0746 11/24/16 1606 11/25/16 0410  NA 141 142 144 142  --  139  K 6.0* 5.1 5.5* 5.3*  --  4.9  CL 115* 114* 117* 115*  --  111  CO2 20* 17* 19* 18*  --  22  GLUCOSE 130* 134* 90 109*  --  108*  BUN 98* 94* 97* 102*  --  112*  CREATININE 2.31* 2.32* 2.48* 2.66*  --  3.11*  CALCIUM 8.8* 8.5* 8.5* 7.9*  --  7.6*  PHOS  --   --   --   --  5.1* 5.4*   GFR: Estimated Creatinine Clearance: 18 mL/min (A) (by C-G formula based on SCr of 3.11 mg/dL (H)). Liver Function Tests: Recent Labs  Lab 11/23/16 0725 11/25/16 0410  AST 43*  --   ALT 101*  --   ALKPHOS 119  --   BILITOT 0.6  --   PROT 6.9  --   ALBUMIN 3.0* 2.7*   No results for input(s): LIPASE, AMYLASE in the last 168 hours. No results for input(s): AMMONIA in the last 168 hours. Coagulation Profile: Recent Labs    Lab 11/23/16 0725  INR 1.23   Cardiac Enzymes: No results for input(s): CKTOTAL, CKMB, CKMBINDEX, TROPONINI in the last 168 hours. BNP (last 3 results) No results for input(s): PROBNP in the last 8760 hours. HbA1C: No results for input(s): HGBA1C in the last 72 hours. CBG: Recent Labs  Lab 11/24/16 1239 11/24/16 1617 11/24/16 2142 11/25/16 0752 11/25/16 0952  GLUCAP 123* 151* 139* 77 78   Lipid Profile: No results for input(s): CHOL, HDL, LDLCALC, TRIG, CHOLHDL, LDLDIRECT in the last 72 hours. Thyroid Function Tests: No results for input(s): TSH, T4TOTAL, FREET4, T3FREE, THYROIDAB in the last 72 hours. Anemia Panel: Recent Labs    11/24/16 1606  VITAMINB12 1,696*  FOLATE 33.0  FERRITIN 189  TIBC 255  IRON 120  RETICCTPCT 2.1   Urine analysis:    Component Value Date/Time   COLORURINE STRAW (A) 11/22/2016 1643   APPEARANCEUR CLEAR 11/22/2016 1643   LABSPEC 1.008 11/22/2016 1643   PHURINE 5.0 11/22/2016 1643   GLUCOSEU NEGATIVE 11/22/2016 1643  HGBUR NEGATIVE 11/22/2016 [place] 11/22/2016 1643   [place] 11/22/2016 1643   PROTEINUR NEGATIVE 11/22/2016 1643   UROBILINOGEN 0.2 12/16/2011 1717   NITRITE NEGATIVE 11/22/2016 1643   LEUKOCYTESUR TRACE (A) 11/22/2016 1643   Sepsis Labs: @LABRCNTIP (procalcitonin:4,lacticidven:4)  ) Recent Results (from the past 240 hour(s))  Respiratory Panel by PCR     Status: None   Collection Time: 11/23/16  2:10 PM  Result Value Ref Range Status   Adenovirus NOT DETECTED NOT DETECTED Final   Coronavirus 229E NOT DETECTED NOT DETECTED Final   Coronavirus HKU1 NOT DETECTED NOT DETECTED Final   Coronavirus NL63 NOT DETECTED NOT DETECTED Final   Coronavirus OC43 NOT DETECTED NOT DETECTED Final   Metapneumovirus NOT DETECTED NOT DETECTED Final   Rhinovirus / Enterovirus NOT DETECTED NOT DETECTED Final   Influenza A NOT DETECTED NOT DETECTED Final   Influenza B NOT DETECTED NOT DETECTED Final    Parainfluenza Virus 1 NOT DETECTED NOT DETECTED Final   Parainfluenza Virus 2 NOT DETECTED NOT DETECTED Final   Parainfluenza Virus 3 NOT DETECTED NOT DETECTED Final   Parainfluenza Virus 4 NOT DETECTED NOT DETECTED Final   Respiratory Syncytial Virus NOT DETECTED NOT DETECTED Final   Bordetella pertussis NOT DETECTED NOT DETECTED Final   Chlamydophila pneumoniae NOT DETECTED NOT DETECTED Final   Mycoplasma pneumoniae NOT DETECTED NOT DETECTED Final      Studies: US Renal  Result Date: 11/24/2016 CLINICAL DATA:  Acute renal failure superimposed on stage 3 chronic kidney disease. EXAM: RENAL / URINARY TRACT ULTRASOUND COMPLETE COMPARISON:  CT renal protocol dated October 30, 2013. FINDINGS: Right Kidney: Length: 8.8 cm. Increased echogenicity. No mass or hydronephrosis visualized. Two small exophytic simple cysts measuring up to 0.9 cm are noted. Left Kidney: Length: 8.6 cm. Increased echogenicity. No mass or hydronephrosis visualized. A 1.3 cm echogenic lesion within the lower pole of the left kidney likely corresponds to a proteinaceous or calcified cyst, stable since 2014. Bladder: Appears normal for degree of bladder distention. IMPRESSION: 1. Increased renal echogenicity bilaterally, consistent with medical renal disease. No hydronephrosis. Electronically Signed   By: Titus Dubin M.D.   On: 11/24/2016 13:09    Scheduled Meds: . amLODipine  10 mg Oral Daily  . darbepoetin (ARANESP) injection - NON-DIALYSIS  60 mcg Subcutaneous Q Fri-1800  . fluticasone  2 spray Each Nare Daily  . folic acid  1 mg Oral Daily  . heparin  5,000 Units Subcutaneous Q8H  . insulin aspart  0-9 Units Subcutaneous TID WC  . insulin glargine  30 Units Subcutaneous Daily  . iron polysaccharides  150 mg Oral BID  . levofloxacin  750 mg Oral Q48H  . LORazepam  0.5 mg Oral q morning - 10a  . magnesium oxide  400 mg Oral Daily  . montelukast  10 mg Oral QHS  . omega-3 acid ethyl esters  1 g Oral Daily  .  pantoprazole  40 mg Oral Daily  . polyvinyl alcohol  1 drop Both Eyes BID  . predniSONE  40 mg Oral Q breakfast  . pyridOXINE  100 mg Oral Daily  . sodium bicarbonate  650 mg Oral TID    Continuous Infusions: . furosemide       LOS: 1 day     Desiree Hane, MD [facility] Pager [phone]  If 7PM-7AM, please contact night-coverage [email or web address] Password [email or web address] 11/25/2016, 11:42 AM

## 2016-11-25 NOTE — Progress Notes (Signed)
Admit: 11/22/2016 LOS: 1  29F with CKD4, dCHF exacerbation, OSA/COPD, anemia  Subjective:  0.8L UOP recorded, 1 unmeasured void, on lasix 120mg  IV BID SCr up to 3.1, BUN 112, K 4.9 Ferritin 189, TSAT 47; Hb 7.8 MCV 91 100% on 2L [place] No weights recorded since 11/14; daily weights ordered Says breathing 'better' but not very forthcoming with information  11/15 0701 - 11/16 0700 In: 782 [P.O.:720; IV Piggyback:62] Out: 800 [Urine:800]  Filed Weights   11/23/16 0500  Weight: 103.7 kg (228 lb 9.9 oz)    Scheduled Meds: . amLODipine  10 mg Oral Daily  . fluticasone  2 spray Each Nare Daily  . folic acid  1 mg Oral Daily  . heparin  5,000 Units Subcutaneous Q8H  . insulin aspart  0-9 Units Subcutaneous TID WC  . insulin glargine  30 Units Subcutaneous Daily  . iron polysaccharides  150 mg Oral BID  . levofloxacin  750 mg Oral Q48H  . LORazepam  0.5 mg Oral q morning - 10a  . magnesium oxide  400 mg Oral Daily  . montelukast  10 mg Oral QHS  . omega-3 acid ethyl esters  1 g Oral Daily  . pantoprazole  40 mg Oral Daily  . polyvinyl alcohol  1 drop Both Eyes BID  . predniSONE  40 mg Oral Q breakfast  . pyridOXINE  100 mg Oral Daily  . sodium bicarbonate  650 mg Oral TID   Continuous Infusions: . furosemide 120 mg (11/24/16 1720)   PRN Meds:.acetaminophen **OR** acetaminophen, bisacodyl, guaiFENesin, hydrALAZINE, HYDROcodone-acetaminophen, ipratropium-albuterol, LORazepam, methocarbamol, ondansetron **OR** ondansetron (ZOFRAN) IV, prochlorperazine, senna-docusate  Current Labs: reviewed    Physical Exam:  Blood pressure 135/73, pulse 72, temperature (!) 94.6 F (34.8 C), resp. rate 18, height 5\' 3"  (1.6 m), weight 103.7 kg (228 lb 9.9 oz), SpO2 100 %. Obese, chronically ill appearing RRR Diminished BS in bases Severe Chronic venous stasis changes in LEs, 4+ edema External urine collection device  A 1. CKD4, some AoCKD, nonproteinuric, small kidneys consistent with  medical-renal disease 2. dCHF exacerbation 3. COPD with exacerbation 4. HTN, stable; off ARB 5. DM2 6. OSA 7. GERD 8. Anemia TSAT 47% no Fe needed; needs ESA  P 1. Cont with diuretics for now given hypervolemia, if SCr worsens further will hold  2. Daily weights, Daily Renal Panel, Strict I/Os, Avoid nephrotoxins (NSAIDs, judicious IV Contrast)  3. She has some knowledge of HD but appears to be marginal candidate, cont to discuss with pt if appears more likely to bei ndicated 4. Start ESA aranesp 21mcg q2wk today 5. Daily weights, Daily Renal Panel, Strict I/Os, Avoid nephrotoxins (NSAIDs, judicious IV Contrast)   Pearson Grippe MD 11/25/2016, 10:32 AM  Recent Labs  Lab 11/23/16 0725 11/24/16 0746 11/24/16 1606 11/25/16 0410  NA 144 142  --  139  K 5.5* 5.3*  --  4.9  CL 117* 115*  --  111  CO2 19* 18*  --  22  GLUCOSE 90 109*  --  108*  BUN 97* 102*  --  112*  CREATININE 2.48* 2.66*  --  3.11*  CALCIUM 8.5* 7.9*  --  7.6*  PHOS  --   --  5.1* 5.4*   Recent Labs  Lab 11/22/16 0541 11/23/16 0725 11/24/16 0746 11/25/16 0410  WBC 6.2 8.1 8.0 6.6  NEUTROABS 4.4  --  7.0  --   HGB 9.2* 8.9* 8.0* 7.8*  HCT 28.0* 27.5* 24.7* 24.1*  MCV 92.4 91.4  90.5 91.3  PLT 139* 122* 114* 119*

## 2016-11-25 NOTE — Plan of Care (Signed)
Patient progressing 

## 2016-11-26 LAB — RENAL FUNCTION PANEL
ALBUMIN: 2.6 g/dL — AB (ref 3.5–5.0)
Anion gap: 6 (ref 5–15)
BUN: 124 mg/dL — AB (ref 6–20)
CHLORIDE: 109 mmol/L (ref 101–111)
CO2: 23 mmol/L (ref 22–32)
CREATININE: 3.34 mg/dL — AB (ref 0.44–1.00)
Calcium: 7.4 mg/dL — ABNORMAL LOW (ref 8.9–10.3)
GFR, EST AFRICAN AMERICAN: 14 mL/min — AB (ref >60)
GFR, EST NON AFRICAN AMERICAN: 12 mL/min — AB (ref >60)
Glucose, Bld: 122 mg/dL — ABNORMAL HIGH (ref 65–99)
PHOSPHORUS: 5.4 mg/dL — AB (ref 2.5–4.6)
POTASSIUM: 5.2 mmol/L — AB (ref 3.5–5.1)
Sodium: 138 mmol/L (ref 135–145)

## 2016-11-26 LAB — GLUCOSE, CAPILLARY
GLUCOSE-CAPILLARY: 78 mg/dL (ref 65–99)
GLUCOSE-CAPILLARY: 112 mg/dL — AB (ref 65–99)
GLUCOSE-CAPILLARY: 230 mg/dL — AB (ref 65–99)
GLUCOSE-CAPILLARY: 261 mg/dL — AB (ref 65–99)

## 2016-11-26 NOTE — Progress Notes (Addendum)
PROGRESS NOTE  [name] CZY:[id number] DOB: August 08, 1940 DOA: 11/22/2016 PCP: Burnard Bunting, MD  HPI/Recap of past 24 hours:  [name] is a 76 y.o. year old female with medical history significant for type 2 diabetes, CKD stage III s/p right renal carcinoma ( s/p cryoablation 07/2009), hypertension, COPD, obstructive sleep apnea on CPAP, hyperlipidemia who presented on 11/22/2016 with worsening dyspnea and was found to have acute on chronic respiratory failure presumed secondary to combined COPD exacerbation and acute exacerbation of chronic diastolic heart failure.  States feels breathing is better. No complaints. Denies cough, chest pain, abdominal pain. Normal BMs    Assessment/Plan: Active Problems:   COPD with asthma (HCC)   Allergic rhinitis   Anemia   Osteoarthritis   OSA (obstructive sleep apnea)   Insomnia   COPD exacerbation (HCC)   Acute exacerbation of CHF (congestive heart failure) (HCC)   Acute on chronic respiratory failure with hypoxia (HCC)   Acute renal failure superimposed on stage 3 chronic kidney disease (HCC)   Metabolic acidosis with normal anion gap and bicarbonate losses  1.AKI on CKD, likely secondary to CHF exacerbation, worsening  Hyperkalemia, improving Non Anion Gap Metabolic Acidosis, improving Concerned may be progression of underlying CKD Status post Kayexalate x2 during admission  Renal ultrasound-consistent with medical renal disease Nephrology consulted: sodium bicarb, aggressive IV diuresis (lasix 120 mg BID), started aranesp,  Monitor BMPs, strict intake and output, avoiding nephrotoxins   2.Acute on chronic respiratory failure secondary, stable  Multifactorial (COPD, CHF exacerbation, ARF).  At home oxygen requirement at rest, but unsure of requirements when mobile. -Continue supplemental oxygen, - Continue inhaler regimen --Aggressive diuresis as mentioned above  3.Acute exacerbation of chronic diastolic heart failure,  hypervolemic.   TTE  shows preserved EF, grade 2 diastolic function.  Chest x-ray on admission with significant interstitial edema, right-sided pleural effusion and cardiomegaly.  BNP 74 however unhelpful in setting of obesity - continue IV Lasix (120 mg twice daily) diuresis -Daily weights, strict intake and output, follow BMP   4.COPD exacerbation.  Improving.  Unclear etiology denies any recent sick contacts.  Reports adherence with medication regimen.  Possibly related to CHF exacerbation -PRN duo nebs, p.o. Levaquin, Oral prednisone, Mucinex, Singulair  5.Hypertension.  Improved control -Home amlodipine -Holding home losartan in setting of persistent hyperkalemia and AK I -PRN IV hydralazine for SBP greater than 170  6.Type 2 diabetes, controlled.  A1c 6.7 (11/22/2016) -Home Lantus 30 units, NovoLog correction coverage  7.GERD.  Stable -Home Protonix   8. OSA on CPAP . Stable Could be contributing to increased PA pressures seen on TTE. Patient non-adherent with BIPAP at home  9. Anemia of CKD  aranesp  Code Status: Full code  Family Communication: No family at bedside  Disposition Plan: IV diuresis, monitoring kidney function/  Consultants:  Nephrology: Dr. Joelyn Oms  Procedures:  TTE 11: EF 60-65 %, wall motion normal, grade 2 diastolic dysfunction, mild aortic stenosis, moderate-severe mitral regurgitation. Severely dilated left and right atrium. Pulmonary arteries severely increased to 72 mmHg  Antimicrobials:  Levaquin 11/22/2016-current  Cultures:  None  DVT prophylaxis: Heparin   Objective: Vitals:   11/25/16 0923 11/25/16 1433 11/25/16 2143 11/26/16 0553  BP: 135/73 (!) 141/46 (!) 131/53 (!) 133/59  Pulse:  (!) 57 64 (!) 56  Resp:  18 (!) 22 20  Temp:   (!) 97.4 F (36.3 C)   TempSrc:   Oral   SpO2:  98% 100% 100%  Weight:  Height:        Intake/Output Summary (Last 24 hours) at 11/26/2016 1137 Last data filed at 11/26/2016 0540 Gross  per 24 hour  Intake 120 ml  Output 2200 ml  Net -2080 ml   Filed Weights   11/23/16 0500  Weight: 103.7 kg (228 lb 9.9 oz)    Exam:   General: Lying in bed, in no apparent distress  Cardiovascular: improving pitting edema of bilateral thigs, 3/6 SEM loudest in LUSB, no radiation   Respiratory: , no appreciable wheezes, on supplemental oxygen  Abdomen: Soft, nondistended  Skin: Chronic skin changes on bilateral lower extremities from ankle to below knee: Darkly hyperpigmented skin.    Neurologic: Alert and oriented x3   Psychiatry: Appropriate affect and mood   Data Reviewed: CBC: Recent Labs  Lab 11/22/16 0405 11/22/16 0541 11/23/16 0725 11/24/16 0746 11/25/16 0410  WBC 1.9* 6.2 8.1 8.0 6.6  NEUTROABS  --  4.4  --  7.0  --   HGB 17.6* 9.2* 8.9* 8.0* 7.8*  HCT 52.7* 28.0* 27.5* 24.7* 24.1*  MCV 92.9 92.4 91.4 90.5 91.3  PLT PLATELET CLUMPS NOTED ON SMEAR, UNABLE TO ESTIMATE 139* 122* 114* 026*   Basic Metabolic Panel: Recent Labs  Lab 11/22/16 1835 11/23/16 0725 11/24/16 0746 11/24/16 1606 11/25/16 0410 11/26/16 0450  NA 142 144 142  --  139 138  K 5.1 5.5* 5.3*  --  4.9 5.2*  CL 114* 117* 115*  --  111 109  CO2 17* 19* 18*  --  22 23  GLUCOSE 134* 90 109*  --  108* 122*  BUN 94* 97* 102*  --  112* 124*  CREATININE 2.32* 2.48* 2.66*  --  3.11* 3.34*  CALCIUM 8.5* 8.5* 7.9*  --  7.6* 7.4*  PHOS  --   --   --  5.1* 5.4* 5.4*   GFR: Estimated Creatinine Clearance: 16.7 mL/min (A) (by C-G formula based on SCr of 3.34 mg/dL (H)). Liver Function Tests: Recent Labs  Lab 11/23/16 0725 11/25/16 0410 11/26/16 0450  AST 43*  --   --   ALT 101*  --   --   ALKPHOS 119  --   --   BILITOT 0.6  --   --   PROT 6.9  --   --   ALBUMIN 3.0* 2.7* 2.6*   No results for input(s): LIPASE, AMYLASE in the last 168 hours. No results for input(s): AMMONIA in the last 168 hours. Coagulation Profile: Recent Labs  Lab 11/23/16 0725  INR 1.23   Cardiac  Enzymes: No results for input(s): CKTOTAL, CKMB, CKMBINDEX, TROPONINI in the last 168 hours. BNP (last 3 results) No results for input(s): PROBNP in the last 8760 hours. HbA1C: No results for input(s): HGBA1C in the last 72 hours. CBG: Recent Labs  Lab 11/25/16 0952 11/25/16 1144 11/25/16 1656 11/25/16 2106 11/26/16 0751  GLUCAP 78 91 165* 210* 78   Lipid Profile: No results for input(s): CHOL, HDL, LDLCALC, TRIG, CHOLHDL, LDLDIRECT in the last 72 hours. Thyroid Function Tests: No results for input(s): TSH, T4TOTAL, FREET4, T3FREE, THYROIDAB in the last 72 hours. Anemia Panel: Recent Labs    11/24/16 1606  VITAMINB12 1,696*  FOLATE 33.0  FERRITIN 189  TIBC 255  IRON 120  RETICCTPCT 2.1   Urine analysis:    Component Value Date/Time   COLORURINE STRAW (A) 11/22/2016 1643   APPEARANCEUR CLEAR 11/22/2016 1643   LABSPEC 1.008 11/22/2016 1643   PHURINE 5.0 11/22/2016 1643  GLUCOSEU NEGATIVE 11/22/2016 1643   HGBUR NEGATIVE 11/22/2016 1643   [place] 11/22/2016 1643   [place] 11/22/2016 1643   PROTEINUR NEGATIVE 11/22/2016 1643   UROBILINOGEN 0.2 12/16/2011 1717   NITRITE NEGATIVE 11/22/2016 1643   LEUKOCYTESUR TRACE (A) 11/22/2016 1643   Sepsis Labs: @LABRCNTIP (procalcitonin:4,lacticidven:4)  ) Recent Results (from the past 240 hour(s))  Respiratory Panel by PCR     Status: None   Collection Time: 11/23/16  2:10 PM  Result Value Ref Range Status   Adenovirus NOT DETECTED NOT DETECTED Final   Coronavirus 229E NOT DETECTED NOT DETECTED Final   Coronavirus HKU1 NOT DETECTED NOT DETECTED Final   Coronavirus NL63 NOT DETECTED NOT DETECTED Final   Coronavirus OC43 NOT DETECTED NOT DETECTED Final   Metapneumovirus NOT DETECTED NOT DETECTED Final   Rhinovirus / Enterovirus NOT DETECTED NOT DETECTED Final   Influenza A NOT DETECTED NOT DETECTED Final   Influenza B NOT DETECTED NOT DETECTED Final   Parainfluenza Virus 1 NOT DETECTED NOT  DETECTED Final   Parainfluenza Virus 2 NOT DETECTED NOT DETECTED Final   Parainfluenza Virus 3 NOT DETECTED NOT DETECTED Final   Parainfluenza Virus 4 NOT DETECTED NOT DETECTED Final   Respiratory Syncytial Virus NOT DETECTED NOT DETECTED Final   Bordetella pertussis NOT DETECTED NOT DETECTED Final   Chlamydophila pneumoniae NOT DETECTED NOT DETECTED Final   Mycoplasma pneumoniae NOT DETECTED NOT DETECTED Final      Studies: No results found.  Scheduled Meds: . amLODipine  10 mg Oral Daily  . darbepoetin (ARANESP) injection - NON-DIALYSIS  60 mcg Subcutaneous Q Fri-1800  . fluticasone  2 spray Each Nare Daily  . folic acid  1 mg Oral Daily  . heparin  5,000 Units Subcutaneous Q8H  . insulin aspart  0-9 Units Subcutaneous TID WC  . insulin glargine  30 Units Subcutaneous Daily  . iron polysaccharides  150 mg Oral BID  . levofloxacin  500 mg Oral Q48H  . LORazepam  0.5 mg Oral q morning - 10a  . magnesium oxide  400 mg Oral Daily  . montelukast  10 mg Oral QHS  . omega-3 acid ethyl esters  1 g Oral Daily  . pantoprazole  40 mg Oral Daily  . polyvinyl alcohol  1 drop Both Eyes BID  . predniSONE  40 mg Oral Q breakfast  . pyridOXINE  100 mg Oral Daily  . sodium bicarbonate  650 mg Oral TID    Continuous Infusions: . furosemide 120 mg (11/26/16 0952)     LOS: 2 days     Desiree Hane, MD [facility] Pager [phone]  If 7PM-7AM, please contact night-coverage [email or web address] Password [facility] 11/26/2016, 11:37 AM

## 2016-11-26 NOTE — Plan of Care (Signed)
Patient progressing 

## 2016-11-26 NOTE — Progress Notes (Signed)
Admit: 11/22/2016 LOS: 2  40F with CKD4, dCHF exacerbation, OSA/COPD, anemia  Subjective:  Eating well, says breathing a little better SCr 3.3, BUN124 Much improved UOP 100% on 3L [place]  11/16 0701 - 11/17 0700 In: 340 [P.O.:340] Out: 2200 [Urine:2200]  Filed Weights   11/23/16 0500  Weight: 103.7 kg (228 lb 9.9 oz)    Scheduled Meds: . amLODipine  10 mg Oral Daily  . darbepoetin (ARANESP) injection - NON-DIALYSIS  60 mcg Subcutaneous Q Fri-1800  . fluticasone  2 spray Each Nare Daily  . folic acid  1 mg Oral Daily  . heparin  5,000 Units Subcutaneous Q8H  . insulin aspart  0-9 Units Subcutaneous TID WC  . insulin glargine  30 Units Subcutaneous Daily  . iron polysaccharides  150 mg Oral BID  . levofloxacin  500 mg Oral Q48H  . LORazepam  0.5 mg Oral q morning - 10a  . magnesium oxide  400 mg Oral Daily  . montelukast  10 mg Oral QHS  . omega-3 acid ethyl esters  1 g Oral Daily  . pantoprazole  40 mg Oral Daily  . polyvinyl alcohol  1 drop Both Eyes BID  . predniSONE  40 mg Oral Q breakfast  . pyridOXINE  100 mg Oral Daily  . sodium bicarbonate  650 mg Oral TID   Continuous Infusions: . furosemide Stopped (11/26/16 0034)   PRN Meds:.acetaminophen **OR** acetaminophen, bisacodyl, guaiFENesin, hydrALAZINE, HYDROcodone-acetaminophen, ipratropium-albuterol, LORazepam, methocarbamol, ondansetron **OR** ondansetron (ZOFRAN) IV, prochlorperazine, senna-docusate  Current Labs: reviewed    Physical Exam:  Blood pressure (!) 133/59, pulse (!) 56, temperature (!) 97.4 F (36.3 C), temperature source Oral, resp. rate 20, height 5\' 3"  (1.6 m), weight 103.7 kg (228 lb 9.9 oz), SpO2 100 %. Obese, chronically ill appearing RRR Diminished BS in bases Severe Chronic venous stasis changes in LEs, 4+ edema External urine collection device  A 1. CKD4, some AoCKD, nonproteinuric, small kidneys consistent with medical-renal disease 2. dCHF exacerbation 3. COPD with  exacerbation 4. Chronic LEE with venous stasis 5. HTN, stable; off ARB 6. DM2 7. OSA 8. GERD 9. Anemia TSAT 47% no Fe needed; needs ESA  P 1. Cont with diuretics for now given hypervolemia,  2. Daily weights, Daily Renal Panel, Strict I/Os, Avoid nephrotoxins (NSAIDs, judicious IV Contrast)  3. She has some knowledge of HD but appears to be marginal candidate, cont to discuss with pt if appears more likely to be indicated 4. Start ESA aranesp 65mcg q2wk 11/16 5. Daily weights, Daily Renal Panel, Strict I/Os, Avoid nephrotoxins (NSAIDs, judicious IV Contrast)   Pearson Grippe MD 11/26/2016, 9:46 AM  Recent Labs  Lab 11/24/16 0746 11/24/16 1606 11/25/16 0410 11/26/16 0450  NA 142  --  139 138  K 5.3*  --  4.9 5.2*  CL 115*  --  111 109  CO2 18*  --  22 23  GLUCOSE 109*  --  108* 122*  BUN 102*  --  112* 124*  CREATININE 2.66*  --  3.11* 3.34*  CALCIUM 7.9*  --  7.6* 7.4*  PHOS  --  5.1* 5.4* 5.4*   Recent Labs  Lab 11/22/16 0541 11/23/16 0725 11/24/16 0746 11/25/16 0410  WBC 6.2 8.1 8.0 6.6  NEUTROABS 4.4  --  7.0  --   HGB 9.2* 8.9* 8.0* 7.8*  HCT 28.0* 27.5* 24.7* 24.1*  MCV 92.4 91.4 90.5 91.3  PLT 139* 122* 114* 119*

## 2016-11-27 LAB — GLUCOSE, CAPILLARY
GLUCOSE-CAPILLARY: 145 mg/dL — AB (ref 65–99)
GLUCOSE-CAPILLARY: 135 mg/dL — AB (ref 65–99)
GLUCOSE-CAPILLARY: 287 mg/dL — AB (ref 65–99)
Glucose-Capillary: 367 mg/dL — ABNORMAL HIGH (ref 65–99)

## 2016-11-27 LAB — BASIC METABOLIC PANEL
Anion gap: 10 (ref 5–15)
Anion gap: 11 (ref 5–15)
BUN: 128 mg/dL — AB (ref 6–20)
BUN: 130 mg/dL — AB (ref 6–20)
CALCIUM: 7.9 mg/dL — AB (ref 8.9–10.3)
CO2: 22 mmol/L (ref 22–32)
CO2: 19 mmol/L — ABNORMAL LOW (ref 22–32)
CREATININE: 3.33 mg/dL — AB (ref 0.44–1.00)
CREATININE: 3.4 mg/dL — AB (ref 0.44–1.00)
Calcium: 7.9 mg/dL — ABNORMAL LOW (ref 8.9–10.3)
Chloride: 107 mmol/L (ref 101–111)
Chloride: 108 mmol/L (ref 101–111)
GFR calc Af Amer: 15 mL/min — ABNORMAL LOW (ref >60)
GFR calc Af Amer: 14 mL/min — ABNORMAL LOW (ref >60)
GFR, EST NON AFRICAN AMERICAN: 13 mL/min — AB (ref >60)
GFR, EST NON AFRICAN AMERICAN: 12 mL/min — AB (ref >60)
GLUCOSE: 286 mg/dL — AB (ref 65–99)
Glucose, Bld: 199 mg/dL — ABNORMAL HIGH (ref 65–99)
Potassium: 5.6 mmol/L — ABNORMAL HIGH (ref 3.5–5.1)
Potassium: 5.9 mmol/L — ABNORMAL HIGH (ref 3.5–5.1)
SODIUM: 139 mmol/L (ref 135–145)
SODIUM: 138 mmol/L (ref 135–145)

## 2016-11-27 LAB — RENAL FUNCTION PANEL
ANION GAP: 7 (ref 5–15)
Albumin: 2.7 g/dL — ABNORMAL LOW (ref 3.5–5.0)
BUN: 130 mg/dL — ABNORMAL HIGH (ref 6–20)
CALCIUM: 7.5 mg/dL — AB (ref 8.9–10.3)
CO2: 24 mmol/L (ref 22–32)
Chloride: 108 mmol/L (ref 101–111)
Creatinine, Ser: 3.43 mg/dL — ABNORMAL HIGH (ref 0.44–1.00)
GFR, EST AFRICAN AMERICAN: 14 mL/min — AB (ref >60)
GFR, EST NON AFRICAN AMERICAN: 12 mL/min — AB (ref >60)
Glucose, Bld: 217 mg/dL — ABNORMAL HIGH (ref 65–99)
PHOSPHORUS: 5.5 mg/dL — AB (ref 2.5–4.6)
Potassium: 5.1 mmol/L (ref 3.5–5.1)
SODIUM: 139 mmol/L (ref 135–145)

## 2016-11-27 MED ORDER — SODIUM POLYSTYRENE SULFONATE 15 GM/60ML PO SUSP
30.0000 g | Freq: Once | ORAL | Status: AC
  Administered 2016-11-27: 30 g via ORAL
  Filled 2016-11-27: qty 120

## 2016-11-27 MED ORDER — SENNOSIDES-DOCUSATE SODIUM 8.6-50 MG PO TABS
2.0000 | ORAL_TABLET | Freq: Two times a day (BID) | ORAL | Status: DC
  Administered 2016-11-27 – 2016-11-28 (×4): 2 via ORAL
  Filled 2016-11-27 (×5): qty 2

## 2016-11-27 MED ORDER — POLYETHYLENE GLYCOL 3350 17 G PO PACK
17.0000 g | PACK | Freq: Every day | ORAL | Status: DC
  Administered 2016-11-27 – 2016-12-02 (×5): 17 g via ORAL
  Filled 2016-11-27 (×6): qty 1

## 2016-11-27 MED ORDER — INSULIN ASPART 100 UNIT/ML ~~LOC~~ SOLN
4.0000 [IU] | Freq: Once | SUBCUTANEOUS | Status: AC
  Administered 2016-11-27: 4 [IU] via SUBCUTANEOUS

## 2016-11-27 MED ORDER — SODIUM BICARBONATE 8.4 % IV SOLN
50.0000 meq | Freq: Once | INTRAVENOUS | Status: AC
  Administered 2016-11-27: 50 meq via INTRAVENOUS
  Filled 2016-11-27: qty 50

## 2016-11-27 MED ORDER — SODIUM CHLORIDE 0.9 % IV SOLN
1.0000 g | Freq: Once | INTRAVENOUS | Status: AC
  Administered 2016-11-27: 1 g via INTRAVENOUS
  Filled 2016-11-27: qty 10

## 2016-11-27 NOTE — Evaluation (Signed)
Physical Therapy Evaluation Patient Details Name: [name] MRN: [id number] DOB: [birth year] Today's Date: 11/27/2016   History of Present Illness  Patient is a 76 y/o female who presents with worsening SOB. Found to have acute on chronic respiratory failure secondary to COPD and decompensated CHF. CXR-pulmonary edema. PMH includes HTN, DM, chronic asthma, depression, COPD.  Clinical Impression  Patient presents with generalized weakness, deconditioning, fatigue, decreased activity tolerance, impaired balance and impaired mobility s/p above. Tolerated bed mobility and transfers with min-Mod A for balance/safety. Pt lives alone and does not have support at d/c. Pt fatigues very quickly after transferring to a chair. Not safe to be home alone. Would benefit from SNF to maximize independence and mobility prior to return home. Will follow acutely.     Follow Up Recommendations SNF    Equipment Recommendations  None recommended by PT    Recommendations for Other Services OT consult     Precautions / Restrictions Precautions Precautions: Fall Restrictions Weight Bearing Restrictions: No      Mobility  Bed Mobility Overal bed mobility: Needs Assistance Bed Mobility: Supine to Sit     Supine to sit: Min assist;HOB elevated     General bed mobility comments: Assist to elevate trunk to get to EOB with increased time and difficulty. Use of rail for support.   Transfers Overall transfer level: Needs assistance Equipment used: Rolling walker (2 wheeled) Transfers: Sit to/from Stand Sit to Stand: Mod assist         General transfer comment: Assist to power to standing with cues for hand placement/technique. Pt pulling up on RW despite cues.   Ambulation/Gait Ambulation/Gait assistance: Min assist;+2 safety/equipment Ambulation Distance (Feet): 4 Feet Assistive device: Rolling walker (2 wheeled) Gait Pattern/deviations: Decreased dorsiflexion - left;Decreased dorsiflexion -  right;Shuffle;Trunk flexed;Narrow base of support;Step-to pattern Gait velocity: decreased   General Gait Details: Able to take a few steps to get to chair with increased time and assist with balance/RW management. Pt with ER BLEs and valgus positioning of bil knees. Difficulty clearing bil feet and flexed knees noted.   Stairs            Wheelchair Mobility    Modified Rankin (Stroke Patients Only)       Balance Overall balance assessment: Needs assistance Sitting-balance support: Feet supported;Single extremity supported Sitting balance-Leahy Scale: Fair Sitting balance - Comments: Able to sit EOB performing AROM BLEs without LOB.    Standing balance support: During functional activity;Bilateral upper extremity supported Standing balance-Leahy Scale: Poor Standing balance comment: Reliant on BUEs for support in standing.                              Pertinent Vitals/Pain Pain Assessment: No/denies pain    Home Living Family/patient expects to be discharged to:: Private residence Living Arrangements: Alone Available Help at Discharge: Friend(s);Personal care attendant;Available PRN/intermittently Type of Home: House Home Access: Level entry     Home Layout: One level Home Equipment: Walker - 4 wheels;Bedside commode;Tub bench      Prior Function Level of Independence: Needs assistance   Gait / Transfers Assistance Needed: Uses rollator for ambulation. Does some cooking, but sits on stool to do so.   ADL's / Homemaking Assistance Needed: Has a aide come twice/week to assist with bathing.   Comments: Wears 3L/min 02 at night and PRN during the day.     Hand Dominance   Dominant Hand: Right  Extremity/Trunk Assessment   Upper Extremity Assessment Upper Extremity Assessment: Defer to OT evaluation    Lower Extremity Assessment Lower Extremity Assessment: Generalized weakness(Swelling present BLEs)       Communication   Communication: No  difficulties  Cognition Arousal/Alertness: Awake/alert Behavior During Therapy: WFL for tasks assessed/performed Overall Cognitive Status: No family/caregiver present to determine baseline cognitive functioning                                 General Comments: Seems WFL, but question safety awareness as pt wants tog o home and needs help with all mobility at this time.      General Comments General comments (skin integrity, edema, etc.): Sp02 ranged from 90-96% on 1.5L/min 02.    Exercises     Assessment/Plan    PT Assessment Patient needs continued PT services  PT Problem List Decreased strength;Decreased mobility;Obesity;Decreased activity tolerance;Cardiopulmonary status limiting activity;Decreased balance       PT Treatment Interventions Balance training;Patient/family education;Gait training;Therapeutic activities;Therapeutic exercise;Cognitive remediation;Functional mobility training    PT Goals (Current goals can be found in the Care Plan section)  Acute Rehab PT Goals Patient Stated Goal: to get out of this bed PT Goal Formulation: With patient Time For Goal Achievement: 12/11/16 Potential to Achieve Goals: Good    Frequency Min 2X/week   Barriers to discharge Decreased caregiver support lives alone    Co-evaluation               AM-PAC PT "6 Clicks" Daily Activity  Outcome Measure Difficulty turning over in bed (including adjusting bedclothes, sheets and blankets)?: Unable Difficulty moving from lying on back to sitting on the side of the bed? : Unable Difficulty sitting down on and standing up from a chair with arms (e.g., wheelchair, bedside commode, etc,.)?: Unable Help needed moving to and from a bed to chair (including a wheelchair)?: A Little Help needed walking in hospital room?: A Lot Help needed climbing 3-5 steps with a railing? : Total 6 Click Score: 9    End of Session Equipment Utilized During Treatment: Gait  belt;Oxygen Activity Tolerance: Patient limited by fatigue Patient left: in chair;with chair alarm set;with call bell/phone within reach Nurse Communication: Mobility status PT Visit Diagnosis: Unsteadiness on feet (R26.81);Muscle weakness (generalized) (M62.81);Difficulty in walking, not elsewhere classified (R26.2)    Time: 6468-0321 PT Time Calculation (min) (ACUTE ONLY): 21 min   Charges:   PT Evaluation $PT Eval Moderate Complexity: 1 Mod     PT G CodesWray Kearns, PT, DPT [phone]    Marguarite Arbour A Jillane Po 11/27/2016, 4:00 PM

## 2016-11-27 NOTE — Progress Notes (Signed)
PROGRESS NOTE  [name] HYW:[id number] DOB: Jun 03, 1940 DOA: 11/22/2016 PCP: Burnard Bunting, MD  HPI/Recap of past 24 hours:  [name] is a 76 y.o. year old female with medical history significant for type 2 diabetes, CKD stage III s/p right renal carcinoma ( s/p cryoablation 07/2009), hypertension, COPD, obstructive sleep apnea on CPAP, hyperlipidemia who presented on 11/22/2016 with worsening dyspnea and was found to have acute on chronic respiratory failure presumed secondary to combined COPD exacerbation and acute exacerbation of chronic diastolic heart failure.   No new complaints. Denies cough, chest pain, abdominal pain. Normal BMs    Assessment/Plan: Active Problems:   COPD with asthma (HCC)   Allergic rhinitis   Anemia   Osteoarthritis   OSA (obstructive sleep apnea)   Insomnia   COPD exacerbation (HCC)   Acute exacerbation of CHF (congestive heart failure) (HCC)   Acute on chronic respiratory failure with hypoxia (HCC)   Acute renal failure superimposed on stage 3 chronic kidney disease (HCC)   Metabolic acidosis with normal anion gap and bicarbonate losses  1.AKI on CKD, likely secondary to CHF exacerbation and progression of CKD, worsening  Hyperkalemia, improving Non Anion Gap Metabolic Acidosis, improving Concerned may be progression of underlying CKD. Uptrend of creatinine seems to be slowing down but clinically still hypervolemic.Renal ultrasound-consistent with medical renal disease Nephrology consulted: oral sodium bicarb, aggressive IV diuresis (lasix 120 mg TID), started aranesp,  Monitor BMPs BID, strict intake and output, avoiding nephrotoxins   2.Acute on chronic respiratory failure secondary, stable  Multifactorial (COPD, CHF exacerbation, ARF).  At home oxygen requirement at rest, but unsure of requirements when mobile. -Continue supplemental oxygen, - Continue inhaler regimen --Aggressive diuresis as mentioned above -- PT/OT  3.Acute  exacerbation of chronic diastolic heart failure, hypervolemic.   TTE  shows preserved EF, grade 2 diastolic function.  Chest x-ray on admission with significant interstitial edema, right-sided pleural effusion and cardiomegaly.  BNP 74 however unhelpful in setting of obesity - continue IV Lasix (120 mg three times daily) diuresis -Daily weights, strict intake and output, follow BMP BID (given increased diuresis)   4.COPD exacerbation.  Improving.  Unclear etiology denies any recent sick contacts.  Reports adherence with medication regimen.  Possibly related to CHF exacerbation -PRN duo nebs, p.o. Levaquin, Oral prednisone completed, Mucinex, Singulair  5.Hypertension.  Improved control -Home amlodipine -Holding home losartan in setting of persistent hyperkalemia and AK I -PRN IV hydralazine for SBP greater than 170  6.Type 2 diabetes, controlled.  A1c 6.7 (11/22/2016) -Home Lantus 30 units, NovoLog correction coverage  7.GERD.  Stable -Home Protonix   8. OSA on CPAP . Stable Could be contributing to increased PA pressures seen on TTE. Patient non-adherent with BIPAP at home  9. Anemia of CKD  aranesp  Code Status: Full code  Family Communication: No family at bedside  Disposition Plan: IV diuresis, monitoring kidney function/  Consultants:  Nephrology: Dr. Joelyn Oms  Procedures:  TTE 11: EF 60-65 %, wall motion normal, grade 2 diastolic dysfunction, mild aortic stenosis, moderate-severe mitral regurgitation. Severely dilated left and right atrium. Pulmonary arteries severely increased to 72 mmHg  Antimicrobials:  Levaquin 11/22/2016-current  Cultures:  None  DVT prophylaxis: Heparin   Objective: Vitals:   11/26/16 2112 11/27/16 0632 11/27/16 0640 11/27/16 0900  BP: (!) 148/63  (!) 156/72   Pulse: 63  (!) 57   Resp: 20  18   Temp: (!) 94.3 F (34.6 C)     TempSrc: Rectal  SpO2: 97%  100% 98%  Weight:  105.6 kg (232 lb 11.2 oz)    Height:         Intake/Output Summary (Last 24 hours) at 11/27/2016 1408 Last data filed at 11/27/2016 4818 Gross per 24 hour  Intake 174 ml  Output 1360 ml  Net -1186 ml   Filed Weights   11/23/16 0500 11/27/16 5631  Weight: 103.7 kg (228 lb 9.9 oz) 105.6 kg (232 lb 11.2 oz)    Exam:   General: Lying in bed, in no apparent distress  Cardiovascular:2+pitting edema of bilateral thigs, 3/6 SEM loudest in LUSB, no radiation   Respiratory: minimal crackles at bases, no appreciable wheezes, on supplemental oxygen  Abdomen: Soft, nondistended  Skin: Chronic skin changes on bilateral lower extremities from ankle to below knee: Darkly hyperpigmented skin.    Neurologic: Alert and oriented x3   Psychiatry: Appropriate affect and mood   Data Reviewed: CBC: Recent Labs  Lab 11/22/16 0405 11/22/16 0541 11/23/16 0725 11/24/16 0746 11/25/16 0410  WBC 1.9* 6.2 8.1 8.0 6.6  NEUTROABS  --  4.4  --  7.0  --   HGB 17.6* 9.2* 8.9* 8.0* 7.8*  HCT 52.7* 28.0* 27.5* 24.7* 24.1*  MCV 92.9 92.4 91.4 90.5 91.3  PLT PLATELET CLUMPS NOTED ON SMEAR, UNABLE TO ESTIMATE 139* 122* 114* 497*   Basic Metabolic Panel: Recent Labs  Lab 11/23/16 0725 11/24/16 0746 11/24/16 1606 11/25/16 0410 11/26/16 0450 11/27/16 0216  NA 144 142  --  139 138 139  K 5.5* 5.3*  --  4.9 5.2* 5.1  CL 117* 115*  --  111 109 108  CO2 19* 18*  --  22 23 24   GLUCOSE 90 109*  --  108* 122* 217*  BUN 97* 102*  --  112* 124* 130*  CREATININE 2.48* 2.66*  --  3.11* 3.34* 3.43*  CALCIUM 8.5* 7.9*  --  7.6* 7.4* 7.5*  PHOS  --   --  5.1* 5.4* 5.4* 5.5*   GFR: Estimated Creatinine Clearance: 16.5 mL/min (A) (by C-G formula based on SCr of 3.43 mg/dL (H)). Liver Function Tests: Recent Labs  Lab 11/23/16 0725 11/25/16 0410 11/26/16 0450 11/27/16 0216  AST 43*  --   --   --   ALT 101*  --   --   --   ALKPHOS 119  --   --   --   BILITOT 0.6  --   --   --   PROT 6.9  --   --   --   ALBUMIN 3.0* 2.7* 2.6* 2.7*   No  results for input(s): LIPASE, AMYLASE in the last 168 hours. No results for input(s): AMMONIA in the last 168 hours. Coagulation Profile: Recent Labs  Lab 11/23/16 0725  INR 1.23   Cardiac Enzymes: No results for input(s): CKTOTAL, CKMB, CKMBINDEX, TROPONINI in the last 168 hours. BNP (last 3 results) No results for input(s): PROBNP in the last 8760 hours. HbA1C: No results for input(s): HGBA1C in the last 72 hours. CBG: Recent Labs  Lab 11/26/16 1253 11/26/16 1720 11/26/16 2235 11/27/16 0754 11/27/16 1212  GLUCAP 112* 230* 261* 145* 135*   Lipid Profile: No results for input(s): CHOL, HDL, LDLCALC, TRIG, CHOLHDL, LDLDIRECT in the last 72 hours. Thyroid Function Tests: No results for input(s): TSH, T4TOTAL, FREET4, T3FREE, THYROIDAB in the last 72 hours. Anemia Panel: Recent Labs    11/24/16 1606  VITAMINB12 1,696*  FOLATE 33.0  FERRITIN 189  TIBC 255  IRON 120  RETICCTPCT 2.1   Urine analysis:    Component Value Date/Time   COLORURINE STRAW (A) 11/22/2016 1643   APPEARANCEUR CLEAR 11/22/2016 1643   LABSPEC 1.008 11/22/2016 1643   PHURINE 5.0 11/22/2016 1643   GLUCOSEU NEGATIVE 11/22/2016 1643   HGBUR NEGATIVE 11/22/2016 1643   BILIRUBINUR NEGATIVE 11/22/2016 1643   KETONESUR NEGATIVE 11/22/2016 1643   PROTEINUR NEGATIVE 11/22/2016 1643   UROBILINOGEN 0.2 12/16/2011 1717   NITRITE NEGATIVE 11/22/2016 1643   LEUKOCYTESUR TRACE (A) 11/22/2016 1643   Sepsis Labs: @LABRCNTIP (procalcitonin:4,lacticidven:4)  ) Recent Results (from the past 240 hour(s))  Respiratory Panel by PCR     Status: None   Collection Time: 11/23/16  2:10 PM  Result Value Ref Range Status   Adenovirus NOT DETECTED NOT DETECTED Final   Coronavirus 229E NOT DETECTED NOT DETECTED Final   Coronavirus HKU1 NOT DETECTED NOT DETECTED Final   Coronavirus NL63 NOT DETECTED NOT DETECTED Final   Coronavirus OC43 NOT DETECTED NOT DETECTED Final   Metapneumovirus NOT DETECTED NOT DETECTED Final    Rhinovirus / Enterovirus NOT DETECTED NOT DETECTED Final   Influenza A NOT DETECTED NOT DETECTED Final   Influenza B NOT DETECTED NOT DETECTED Final   Parainfluenza Virus 1 NOT DETECTED NOT DETECTED Final   Parainfluenza Virus 2 NOT DETECTED NOT DETECTED Final   Parainfluenza Virus 3 NOT DETECTED NOT DETECTED Final   Parainfluenza Virus 4 NOT DETECTED NOT DETECTED Final   Respiratory Syncytial Virus NOT DETECTED NOT DETECTED Final   Bordetella pertussis NOT DETECTED NOT DETECTED Final   Chlamydophila pneumoniae NOT DETECTED NOT DETECTED Final   Mycoplasma pneumoniae NOT DETECTED NOT DETECTED Final      Studies: No results found.  Scheduled Meds: . amLODipine  10 mg Oral Daily  . darbepoetin (ARANESP) injection - NON-DIALYSIS  60 mcg Subcutaneous Q Fri-1800  . fluticasone  2 spray Each Nare Daily  . folic acid  1 mg Oral Daily  . heparin  5,000 Units Subcutaneous Q8H  . insulin aspart  0-9 Units Subcutaneous TID WC  . insulin glargine  30 Units Subcutaneous Daily  . iron polysaccharides  150 mg Oral BID  . levofloxacin  500 mg Oral Q48H  . LORazepam  0.5 mg Oral q morning - 10a  . magnesium oxide  400 mg Oral Daily  . montelukast  10 mg Oral QHS  . omega-3 acid ethyl esters  1 g Oral Daily  . pantoprazole  40 mg Oral Daily  . polyethylene glycol  17 g Oral Daily  . polyvinyl alcohol  1 drop Both Eyes BID  . pyridOXINE  100 mg Oral Daily  . senna-docusate  2 tablet Oral BID  . sodium bicarbonate  650 mg Oral TID    Continuous Infusions: . furosemide 120 mg (11/27/16 0900)     LOS: 3 days     Desiree Hane, MD [facility] Pager [phone]  If 7PM-7AM, please contact night-coverage [email or web address] Password [email or web address] 11/27/2016, 2:08 PM

## 2016-11-27 NOTE — Progress Notes (Signed)
Admit: 11/22/2016 LOS: 3  74F with CKD4, dCHF exacerbation, OSA/COPD, anemia  Subjective:  Looks better, says dyspnea improved but hasn't been out of bed Decent diuresis with BID lasix 100% on 3L  Stable GFR / BUN  11/17 0701 - 11/18 0700 In: 174 [IV Piggyback:174] Out: 2360 [Urine:2360]  Filed Weights   11/23/16 0500 11/27/16 1017  Weight: 103.7 kg (228 lb 9.9 oz) 105.6 kg (232 lb 11.2 oz)    Scheduled Meds: . amLODipine  10 mg Oral Daily  . darbepoetin (ARANESP) injection - NON-DIALYSIS  60 mcg Subcutaneous Q Fri-1800  . fluticasone  2 spray Each Nare Daily  . folic acid  1 mg Oral Daily  . heparin  5,000 Units Subcutaneous Q8H  . insulin aspart  0-9 Units Subcutaneous TID WC  . insulin glargine  30 Units Subcutaneous Daily  . iron polysaccharides  150 mg Oral BID  . levofloxacin  500 mg Oral Q48H  . LORazepam  0.5 mg Oral q morning - 10a  . magnesium oxide  400 mg Oral Daily  . montelukast  10 mg Oral QHS  . omega-3 acid ethyl esters  1 g Oral Daily  . pantoprazole  40 mg Oral Daily  . polyethylene glycol  17 g Oral Daily  . polyvinyl alcohol  1 drop Both Eyes BID  . pyridOXINE  100 mg Oral Daily  . senna-docusate  2 tablet Oral BID  . sodium bicarbonate  650 mg Oral TID   Continuous Infusions: . furosemide 120 mg (11/27/16 0900)   PRN Meds:.acetaminophen **OR** acetaminophen, bisacodyl, guaiFENesin, hydrALAZINE, HYDROcodone-acetaminophen, ipratropium-albuterol, LORazepam, methocarbamol, ondansetron **OR** ondansetron (ZOFRAN) IV, prochlorperazine  Current Labs: reviewed    Physical Exam:  Blood pressure (!) 156/72, pulse (!) 57, temperature (!) 94.3 F (34.6 C), temperature source Rectal, resp. rate 18, height 5\' 3"  (1.6 m), weight 105.6 kg (232 lb 11.2 oz), SpO2 100 %. Obese, chronically ill appearing RRR Diminished BS in bases Severe Chronic venous stasis changes in LEs, 4+ edema External urine collection device  A 1. CKD4, some AoCKD, nonproteinuric,  small kidneys consistent with medical-renal disease 2. dCHF exacerbation 3. COPD with exacerbation 4. Chronic LEE with venous stasis 5. HTN, stable; off ARB 6. DM2 7. OSA 8. GERD 9. Anemia TSAT 47% no Fe needed; needs ESA  P 1. Cont with diuretics for now given hypervolemia,  2. Needs PT/OT, out of bed to chair and ambulating 3. Daily weights, Daily Renal Panel, Strict I/Os, Avoid nephrotoxins (NSAIDs, judicious IV Contrast)  4. She has some knowledge of HD but appears to be marginal candidate, cont to discuss with pt if appears more likely to be indicated 5. Cont ESA aranesp 43mcg q2wk given 11/16  Pearson Grippe MD 11/27/2016, 10:08 AM  Recent Labs  Lab 11/25/16 0410 11/26/16 0450 11/27/16 0216  NA 139 138 139  K 4.9 5.2* 5.1  CL 111 109 108  CO2 22 23 24   GLUCOSE 108* 122* 217*  BUN 112* 124* 130*  CREATININE 3.11* 3.34* 3.43*  CALCIUM 7.6* 7.4* 7.5*  PHOS 5.4* 5.4* 5.5*   Recent Labs  Lab 11/22/16 0541 11/23/16 0725 11/24/16 0746 11/25/16 0410  WBC 6.2 8.1 8.0 6.6  NEUTROABS 4.4  --  7.0  --   HGB 9.2* 8.9* 8.0* 7.8*  HCT 28.0* 27.5* 24.7* 24.1*  MCV 92.4 91.4 90.5 91.3  PLT 139* 122* 114* 119*

## 2016-11-28 LAB — RENAL FUNCTION PANEL
ANION GAP: 10 (ref 5–15)
Albumin: 3 g/dL — ABNORMAL LOW (ref 3.5–5.0)
BUN: 127 mg/dL — ABNORMAL HIGH (ref 6–20)
CALCIUM: 7.9 mg/dL — AB (ref 8.9–10.3)
CHLORIDE: 104 mmol/L (ref 101–111)
CO2: 28 mmol/L (ref 22–32)
CREATININE: 3.26 mg/dL — AB (ref 0.44–1.00)
GFR, EST AFRICAN AMERICAN: 15 mL/min — AB (ref >60)
GFR, EST NON AFRICAN AMERICAN: 13 mL/min — AB (ref >60)
Glucose, Bld: 223 mg/dL — ABNORMAL HIGH (ref 65–99)
Phosphorus: 4.7 mg/dL — ABNORMAL HIGH (ref 2.5–4.6)
Potassium: 4.3 mmol/L (ref 3.5–5.1)
SODIUM: 142 mmol/L (ref 135–145)

## 2016-11-28 LAB — GLUCOSE, CAPILLARY
GLUCOSE-CAPILLARY: 153 mg/dL — AB (ref 65–99)
Glucose-Capillary: 166 mg/dL — ABNORMAL HIGH (ref 65–99)
Glucose-Capillary: 278 mg/dL — ABNORMAL HIGH (ref 65–99)
Glucose-Capillary: 206 mg/dL — ABNORMAL HIGH (ref 65–99)

## 2016-11-28 LAB — PROTEIN ELECTROPHORESIS, SERUM
A/G Ratio: 0.9 (ref 0.7–1.7)
ALPHA-1-GLOBULIN: 0.2 g/dL (ref 0.0–0.4)
ALPHA-2-GLOBULIN: 0.6 g/dL (ref 0.4–1.0)
Albumin ELP: 2.8 g/dL — ABNORMAL LOW (ref 2.9–4.4)
Beta Globulin: 0.9 g/dL (ref 0.7–1.3)
GLOBULIN, TOTAL: 3.1 g/dL (ref 2.2–3.9)
Gamma Globulin: 1.4 g/dL (ref 0.4–1.8)
Total Protein ELP: 5.9 g/dL — ABNORMAL LOW (ref 6.0–8.5)

## 2016-11-28 MED ORDER — HYDRALAZINE HCL 10 MG PO TABS
10.0000 mg | ORAL_TABLET | Freq: Three times a day (TID) | ORAL | Status: DC
  Administered 2016-11-28 – 2016-12-02 (×13): 10 mg via ORAL
  Filled 2016-11-28 (×13): qty 1

## 2016-11-28 MED ORDER — CALCITRIOL 0.25 MCG PO CAPS
0.2500 ug | ORAL_CAPSULE | ORAL | Status: DC
  Administered 2016-11-28 – 2016-12-02 (×3): 0.25 ug via ORAL
  Filled 2016-11-28 (×3): qty 1

## 2016-11-28 NOTE — Progress Notes (Signed)
PROGRESS NOTE  [name] NAT:[id number] DOB: [birth year] DOA: 11/22/2016 PCP: Burnard Bunting, MD  HPI/Recap of past 24 hours:  [name] is a 76 y.o. year old female with medical history significant for type 2 diabetes, CKD stage III s/p right renal carcinoma ( s/p cryoablation 07/2009), hypertension, COPD, obstructive sleep apnea on CPAP, hyperlipidemia who presented on 11/22/2016 with worsening dyspnea and was found to have acute on chronic respiratory failure presumed secondary to combined COPD exacerbation and acute exacerbation of chronic diastolic heart failure.   No new complaints. Worked with PT yesterday who recommends SNF.  Complained of constipation yesterday had good BM with regimen overnight. Denies CP, SOB, n/v. Minimal abdominal pain   Assessment/Plan: Active Problems:   COPD with asthma (HCC)   Allergic rhinitis   Anemia   Osteoarthritis   OSA (obstructive sleep apnea)   Insomnia   COPD exacerbation (HCC)   Acute exacerbation of CHF (congestive heart failure) (HCC)   Acute on chronic respiratory failure with hypoxia (HCC)   Acute renal failure superimposed on stage 3 chronic kidney disease (HCC)   Metabolic acidosis with normal anion gap and bicarbonate losses  1.AKI on CKD, likely secondary to CHF exacerbation and progression of CKD, stable Hyperkalemia, resolved Non Anion Gap Metabolic Acidosis, resolved Creatinine finally downtrending in setting of aggressive IV diuresis with net negative 6 L during hospital course. Weights have not been monitored to see correlation in output but given slight improvement in creatinine seem to be getting some fluid off.  No increased oxygen requirement but still noticeably volume on exam. Likely progression of CKD given no acute insult with renal u/s consistent with medical renal disease.  Nephrology consulted Oral sodium bicarb, may need to d/c or decrease as Bicarb 28 today ( mixed picture of bicarb and chronic retainer  from COPD?) Continue aggressive IV diuresis (lasix 120 mg TID)  Monitor BMPs, strict intake and output, avoiding nephrotoxins  2.Acute on chronic respiratory failure secondary, stable  Multifactorial (COPD, CHF exacerbation, ARF).  At home oxygen requirement at rest, but unsure of requirements when mobile. -Continue supplemental oxygen, - Continue inhaler regimen --Aggressive diuresis as mentioned above -- PT recommends SNF  3.Acute exacerbation of chronic diastolic heart failure, hypervolemic   TTE  shows preserved EF, grade 2 diastolic function.  Chest x-ray on admission with significant interstitial edema, right-sided pleural effusion and cardiomegaly.  BNP 75 however unhelpful in setting of obesity - continue IV Lasix (120 mg three times daily) diuresis -Daily weights, strict intake and output, follow BMP BID (given increased diuresis)   4.COPD exacerbation.  Improving.  Unclear etiology denies any recent sick contacts.  Reports adherence with medication regimen.  Possibly related to CHF exacerbation -PRN duo nebs, p.o. Levaquin, Oral prednisone completed, Mucinex, Singulair  5.Hypertension. Poorly controlled Only on home amlodipine. Limited as losartan held in setting of AKI on CKD and hyperkalemia. Goal 140/90. Range in 24 hours 150-180s-60-70 Home amlodipine Holding home losartan in setting of persistent hyperkalemia and AKI Start po hydralazine, may see some improvement with diuresis PRN IV hydralazine for SBP greater than 170  6.Type 2 diabetes, controlled.  A1c 6.7 (11/22/2016) -Home Lantus 30 units, NovoLog correction coverage  7.GERD.  Stable -Home Protonix   8. OSA on CPAP . Stable Could be contributing to increased PA pressures seen on TTE. Patient non-adherent with BIPAP at home  9. Anemia of CKD  aranesp  Code Status: Full code  Family Communication: No family at bedside  Disposition  Plan: IV diuresis, monitoring kidney  function/  Consultants:  Nephrology: Dr. Joelyn Oms  Procedures:  TTE 11: EF 60-65 %, wall motion normal, grade 2 diastolic dysfunction, mild aortic stenosis, moderate-severe mitral regurgitation. Severely dilated left and right atrium. Pulmonary arteries severely increased to 72 mmHg  Antimicrobials:  Levaquin 11/22/2016-current  Cultures:  None  DVT prophylaxis: Heparin   Objective: Vitals:   11/28/16 0526 11/28/16 0547 11/28/16 0630 11/28/16 0943  BP: (!) 185/70  (!) 157/63 (!) 161/80  Pulse: 64  63   Resp: 18     Temp:  (!) 94.3 F (34.6 C)    TempSrc:  Rectal    SpO2: 100%     Weight:      Height:        Intake/Output Summary (Last 24 hours) at 11/28/2016 1138 Last data filed at 11/28/2016 0900 Gross per 24 hour  Intake 50 ml  Output 1100 ml  Net -1050 ml   Filed Weights   11/23/16 0500 11/27/16 1448  Weight: 103.7 kg (228 lb 9.9 oz) 105.6 kg (232 lb 11.2 oz)    Exam:   General: Lying in bed, in no apparent distress  Cardiovascular:2+pitting edema of bilateral thigs, 3/6 SEM loudest in LUSB, no radiation   Respiratory: minimal crackles at bases, no appreciable wheezes, on supplemental oxygen  Abdomen: Soft, nondistended  Skin: Chronic skin changes on bilateral lower extremities from ankle to below knee: Darkly hyperpigmented skin.    Neurologic: Alert and oriented x3   Psychiatry: Appropriate affect and mood   Data Reviewed: CBC: Recent Labs  Lab 11/22/16 0405 11/22/16 0541 11/23/16 0725 11/24/16 0746 11/25/16 0410  WBC 1.9* 6.2 8.1 8.0 6.6  NEUTROABS  --  4.4  --  7.0  --   HGB 17.6* 9.2* 8.9* 8.0* 7.8*  HCT 52.7* 28.0* 27.5* 24.7* 24.1*  MCV 92.9 92.4 91.4 90.5 91.3  PLT PLATELET CLUMPS NOTED ON SMEAR, UNABLE TO ESTIMATE 139* 122* 114* 185*   Basic Metabolic Panel: Recent Labs  Lab 11/24/16 1606 11/25/16 0410 11/26/16 0450 11/27/16 0216 11/27/16 1423 11/27/16 1609 11/28/16 0529  NA  --  139 138 139 139 138 142  K  --   4.9 5.2* 5.1 5.6* 5.9* 4.3  CL  --  111 109 108 107 108 104  CO2  --  22 23 24 22  19* 28  GLUCOSE  --  108* 122* 217* 199* 286* 223*  BUN  --  112* 124* 130* 128* 130* 127*  CREATININE  --  3.11* 3.34* 3.43* 3.33* 3.40* 3.26*  CALCIUM  --  7.6* 7.4* 7.5* 7.9* 7.9* 7.9*  PHOS 5.1* 5.4* 5.4* 5.5*  --   --  4.7*   GFR: Estimated Creatinine Clearance: 17.3 mL/min (A) (by C-G formula based on SCr of 3.26 mg/dL (H)). Liver Function Tests: Recent Labs  Lab 11/23/16 0725 11/25/16 0410 11/26/16 0450 11/27/16 0216 11/28/16 0529  AST 43*  --   --   --   --   ALT 101*  --   --   --   --   ALKPHOS 119  --   --   --   --   BILITOT 0.6  --   --   --   --   PROT 6.9  --   --   --   --   ALBUMIN 3.0* 2.7* 2.6* 2.7* 3.0*   No results for input(s): LIPASE, AMYLASE in the last 168 hours. No results for input(s): AMMONIA in the last  168 hours. Coagulation Profile: Recent Labs  Lab 11/23/16 0725  INR 1.23   Cardiac Enzymes: No results for input(s): CKTOTAL, CKMB, CKMBINDEX, TROPONINI in the last 168 hours. BNP (last 3 results) No results for input(s): PROBNP in the last 8760 hours. HbA1C: No results for input(s): HGBA1C in the last 72 hours. CBG: Recent Labs  Lab 11/27/16 0754 11/27/16 1212 11/27/16 1659 11/27/16 2115 11/28/16 0819  GLUCAP 145* 135* 287* 367* 166*   Lipid Profile: No results for input(s): CHOL, HDL, LDLCALC, TRIG, CHOLHDL, LDLDIRECT in the last 72 hours. Thyroid Function Tests: No results for input(s): TSH, T4TOTAL, FREET4, T3FREE, THYROIDAB in the last 72 hours. Anemia Panel: No results for input(s): VITAMINB12, FOLATE, FERRITIN, TIBC, IRON, RETICCTPCT in the last 72 hours. Urine analysis:    Component Value Date/Time   COLORURINE STRAW (A) 11/22/2016 1643   APPEARANCEUR CLEAR 11/22/2016 1643   LABSPEC 1.008 11/22/2016 1643   PHURINE 5.0 11/22/2016 1643   GLUCOSEU NEGATIVE 11/22/2016 1643   HGBUR NEGATIVE 11/22/2016 1643   BILIRUBINUR NEGATIVE 11/22/2016  1643   KETONESUR NEGATIVE 11/22/2016 1643   PROTEINUR NEGATIVE 11/22/2016 1643   UROBILINOGEN 0.2 12/16/2011 1717   NITRITE NEGATIVE 11/22/2016 1643   LEUKOCYTESUR TRACE (A) 11/22/2016 1643   Sepsis Labs: @LABRCNTIP (procalcitonin:4,lacticidven:4)  ) Recent Results (from the past 240 hour(s))  Respiratory Panel by PCR     Status: None   Collection Time: 11/23/16  2:10 PM  Result Value Ref Range Status   Adenovirus NOT DETECTED NOT DETECTED Final   Coronavirus 229E NOT DETECTED NOT DETECTED Final   Coronavirus HKU1 NOT DETECTED NOT DETECTED Final   Coronavirus NL63 NOT DETECTED NOT DETECTED Final   Coronavirus OC43 NOT DETECTED NOT DETECTED Final   Metapneumovirus NOT DETECTED NOT DETECTED Final   Rhinovirus / Enterovirus NOT DETECTED NOT DETECTED Final   Influenza A NOT DETECTED NOT DETECTED Final   Influenza B NOT DETECTED NOT DETECTED Final   Parainfluenza Virus 1 NOT DETECTED NOT DETECTED Final   Parainfluenza Virus 2 NOT DETECTED NOT DETECTED Final   Parainfluenza Virus 3 NOT DETECTED NOT DETECTED Final   Parainfluenza Virus 4 NOT DETECTED NOT DETECTED Final   Respiratory Syncytial Virus NOT DETECTED NOT DETECTED Final   Bordetella pertussis NOT DETECTED NOT DETECTED Final   Chlamydophila pneumoniae NOT DETECTED NOT DETECTED Final   Mycoplasma pneumoniae NOT DETECTED NOT DETECTED Final      Studies: No results found.  Scheduled Meds: . amLODipine  10 mg Oral Daily  . darbepoetin (ARANESP) injection - NON-DIALYSIS  60 mcg Subcutaneous Q Fri-1800  . fluticasone  2 spray Each Nare Daily  . folic acid  1 mg Oral Daily  . heparin  5,000 Units Subcutaneous Q8H  . insulin aspart  0-9 Units Subcutaneous TID WC  . insulin glargine  30 Units Subcutaneous Daily  . iron polysaccharides  150 mg Oral BID  . LORazepam  0.5 mg Oral q morning - 10a  . magnesium oxide  400 mg Oral Daily  . montelukast  10 mg Oral QHS  . omega-3 acid ethyl esters  1 g Oral Daily  . pantoprazole  40  mg Oral Daily  . polyethylene glycol  17 g Oral Daily  . polyvinyl alcohol  1 drop Both Eyes BID  . pyridOXINE  100 mg Oral Daily  . senna-docusate  2 tablet Oral BID  . sodium bicarbonate  650 mg Oral TID    Continuous Infusions: . furosemide 120 mg (11/28/16 1050)  LOS: 4 days     Desiree Hane, MD [facility] Pager [phone]  If 7PM-7AM, please contact night-coverage [email or web address] Password [email or web address] 11/28/2016, 11:38 AM

## 2016-11-28 NOTE — [place] FL2 (Signed)
[place] MEDICAID FL2 LEVEL OF CARE SCREENING TOOL     IDENTIFICATION  Patient Name: [name] [facility] Birthdate: 08/18/1940 Sex: female Admission Date (Current Location): 11/22/2016  [facility] and [place] Number:  Herbalist and Address:  [facility], [place] [address], [place],  [address]      Provider Number: [id number]  Attending Physician Name and Address:  Desiree Hane, MD  Relative Name and Phone Number:  Kathi Der, friend, [phone]    Current Level of Care: Hospital Recommended Level of Care: [place] Prior Approval Number:    Date Approved/Denied:   PASRR Number: [id number]  Discharge Plan: SNF    Current Diagnoses: Patient Active Problem List   Diagnosis Date Noted  . Acute renal failure superimposed on stage 3 chronic kidney disease ([place]) 11/23/2016  . Metabolic acidosis with normal anion gap and bicarbonate losses 11/23/2016  . Acute exacerbation of CHF (congestive heart failure) ([place]) 11/22/2016  . Acute on chronic respiratory failure with hypoxia ([place]) 11/22/2016  . Midfoot ulcer, left, limited to breakdown of skin ([place]) 06/13/2016  . Idiopathic chronic venous hypertension of both lower extremities with inflammation 03/10/2016  . Onychomycosis 03/10/2016  . COPD exacerbation ([place]) 11/07/2013  . Upper airway cough syndrome 11/07/2013  . Insomnia 12/10/2012  . OSA (obstructive sleep apnea) 09/17/2012  . Benign neoplasm of colon 08/14/2012  . Rectal bleeding 08/13/2012    Class: Acute  . Anemia 08/13/2012    Class: Acute  . Osteoarthritis 08/13/2012    Class: Chronic  . Acute posthemorrhagic anemia 08/13/2012  . Diverticulosis of colon (without mention of hemorrhage) 08/13/2012  . Hoarseness 07/20/2011  . COPD with asthma ([place]) 04/20/2011  . Allergic rhinitis 04/20/2011  . GI bleed 02/08/2011  . Personal history of colonic polyps 04/16/2010  . Fecal incontinence 03/23/2010  . Change in bowel  habits 03/23/2010    Orientation RESPIRATION BLADDER Height & Weight     Self, Time, Situation, Place  O2(Nasal cannula 1.5L) Incontinent, External catheter Weight: 105.6 kg (232 lb 11.2 oz) Height:  5\' 3"  (160 cm)  BEHAVIORAL SYMPTOMS/MOOD NEUROLOGICAL BOWEL NUTRITION STATUS  (N/A)   Continent Diet(Please see DC Summary)  AMBULATORY STATUS COMMUNICATION OF NEEDS Skin   Limited Assist Verbally Other (Comment)(Wound on buttocks with foam dressing)                       Personal Care Assistance Level of Assistance  Bathing, Feeding, Dressing Bathing Assistance: Limited assistance Feeding assistance: Independent Dressing Assistance: Limited assistance     Functional Limitations Info             SPECIAL CARE FACTORS FREQUENCY  PT (By licensed PT)     PT Frequency: 5x/week              Contractures      Additional Factors Info  Code Status, Allergies, Psychotropic, Insulin Sliding Scale Code Status Info: Full Allergies Info: Other "all generics make her sick" Psychotropic Info: 3x daily with meals Insulin Sliding Scale Info: Ativan       Current Medications (11/28/2016):  This is the current hospital active medication list Current Facility-Administered Medications  Medication Dose Route Frequency Provider Last Rate Last Dose  . acetaminophen (TYLENOL) tablet 650 mg  650 mg Oral Q6H PRN Sharene Butters E, PA-C       Or  . acetaminophen (TYLENOL) suppository 650 mg  650 mg Rectal Q6H PRN Rondel Jumbo, PA-C      .  amLODipine (NORVASC) tablet 10 mg  10 mg Oral Daily Rondel Jumbo, PA-C   10 mg at 11/27/16 0848  . bisacodyl (DULCOLAX) suppository 10 mg  10 mg Rectal Daily PRN Rondel Jumbo, PA-C      . Darbepoetin Alfa (ARANESP) injection 60 mcg  60 mcg Subcutaneous Q Fri-1800 Pearson Grippe B, MD   60 mcg at 11/25/16 1738  . fluticasone (FLONASE) 50 MCG/ACT nasal spray 2 spray  2 spray Each Nare Daily Rondel Jumbo, PA-C   2 spray at 11/27/16 0857  . folic  acid (FOLVITE) tablet 1 mg  1 mg Oral Daily Oretha Milch D, MD   1 mg at 11/27/16 0846  . furosemide (LASIX) 120 mg in dextrose 5 % 50 mL IVPB  120 mg Intravenous TID Rexene Agent, MD   Stopped at 11/28/16 0105  . guaiFENesin (MUCINEX) 12 hr tablet 600 mg  600 mg Oral BID PRN Rondel Jumbo, PA-C      . heparin injection 5,000 Units  5,000 Units Subcutaneous Q8H Rondel Jumbo, PA-C   5,000 Units at 11/28/16 0515  . hydrALAZINE (APRESOLINE) injection 10 mg  10 mg Intravenous Q6H PRN Oretha Milch D, MD   10 mg at 11/28/16 0531  . HYDROcodone-acetaminophen (NORCO/VICODIN) 5-325 MG per tablet 1-2 tablet  1-2 tablet Oral Q4H PRN Rondel Jumbo, PA-C   2 tablet at 11/26/16 0112  . insulin aspart (novoLOG) injection 0-9 Units  0-9 Units Subcutaneous TID WC Rondel Jumbo, PA-C   5 Units at 11/27/16 1709  . insulin glargine (LANTUS) injection 30 Units  30 Units Subcutaneous Daily Rondel Jumbo, PA-C   30 Units at 11/27/16 1610  . ipratropium-albuterol (DUONEB) 0.5-2.5 (3) MG/3ML nebulizer solution 3 mL  3 mL Nebulization Q6H PRN Oretha Milch D, MD      . iron polysaccharides (NIFEREX) capsule 150 mg  150 mg Oral BID Rondel Jumbo, PA-C   150 mg at 11/27/16 2027  . LORazepam (ATIVAN) tablet 0.5 mg  0.5 mg Oral q morning - 10a Rondel Jumbo, PA-C   0.5 mg at 11/27/16 0848  . LORazepam (ATIVAN) tablet 1 mg  1 mg Oral QHS PRN Waldemar Dickens, MD      . magnesium oxide (MAG-OX) tablet 400 mg  400 mg Oral Daily Rondel Jumbo, PA-C   400 mg at 11/27/16 0847  . methocarbamol (ROBAXIN) tablet 500 mg  500 mg Oral TID PRN Rondel Jumbo, PA-C      . montelukast (SINGULAIR) tablet 10 mg  10 mg Oral QHS Rondel Jumbo, PA-C   10 mg at 11/27/16 2028  . omega-3 acid ethyl esters (LOVAZA) capsule 1 g  1 g Oral Daily Rondel Jumbo, PA-C   1 g at 11/27/16 0847  . ondansetron (ZOFRAN) tablet 4 mg  4 mg Oral Q6H PRN Rondel Jumbo, PA-C       Or  . ondansetron (ZOFRAN) injection 4 mg  4 mg  Intravenous Q6H PRN Rondel Jumbo, PA-C      . pantoprazole (PROTONIX) EC tablet 40 mg  40 mg Oral Daily Rondel Jumbo, PA-C   40 mg at 11/27/16 0847  . polyethylene glycol (MIRALAX / GLYCOLAX) packet 17 g  17 g Oral Daily Oretha Milch D, MD   17 g at 11/27/16 1034  . polyvinyl alcohol (LIQUIFILM TEARS) 1.4 % ophthalmic solution 1 drop  1 drop Both Eyes BID Waldemar Dickens, MD  1 drop at 11/27/16 2028  . prochlorperazine (COMPAZINE) tablet 5 mg  5 mg Oral Q6H PRN Rondel Jumbo, PA-C      . pyridOXINE (VITAMIN B-6) tablet 100 mg  100 mg Oral Daily Rondel Jumbo, PA-C   100 mg at 11/27/16 0846  . senna-docusate (Senokot-S) tablet 2 tablet  2 tablet Oral BID Oretha Milch D, MD   2 tablet at 11/27/16 2028  . sodium bicarbonate tablet 650 mg  650 mg Oral TID Desiree Hane, MD   650 mg at 11/27/16 2028     Discharge Medications: Please see discharge summary for a list of discharge medications.  Relevant Imaging Results:  Relevant Lab Results:   Additional Information SSN: [place], [place]

## 2016-11-28 NOTE — Clinical Social Work Note (Signed)
Clinical Social Work Assessment  Patient Details  Name: [name] MRN: [id number] Date of Birth: Nov 09, 1940  Date of referral:  11/28/16               Reason for consult:  Facility Placement                Permission sought to share information with:  Facility Art therapist granted to share information::  Yes, Verbal Permission Granted  Name::        Agency::  SNFs  Relationship::     Contact Information:     Housing/Transportation Living arrangements for the past 2 months:  Single Family Home Source of Information:  Patient Patient Interpreter Needed:  None Criminal Activity/Legal Involvement Pertinent to Current Situation/Hospitalization:  No - Comment as needed Significant Relationships:  Friend Lives with:  Self Do you feel safe going back to the place where you live?  No Need for family participation in patient care:  No (Coment)  Care giving concerns:  CSW received consult for possible SNF placement at time of discharge. CSW spoke with patient regarding PT recommendation of SNF placement at time of discharge. Patient reported that she lives alone and would be willing to go to SNF. She will have to ask friends to bring her clothing. Patient expressed understanding of PT recommendation and is agreeable to SNF placement at time of discharge. CSW to continue to follow and assist with discharge planning needs.   Social Worker assessment / plan:  CSW spoke with patient concerning possibility of rehab at [facility] before returning home.  Employment status:  Retired Nurse, adult PT Recommendations:  [place] / Referral to community resources:  [place]  Patient/Family's Response to care:  Patient recognizes need for rehab before returning home and is agreeable to a SNF in [place]. Patient is reviewing list, but does not want to go to [facility].   Patient/Family's Understanding of and  Emotional Response to Diagnosis, Current Treatment, and Prognosis:  Patient/family is realistic regarding therapy needs and expressed being hopeful for SNF placement. Patient expressed understanding of CSW role and discharge process as well as medical condition. No questions/concerns about plan or treatment.    Emotional Assessment Appearance:  Appears stated age Attitude/Demeanor/Rapport:  Other(Appropriate) Affect (typically observed):  Accepting, Appropriate, Pleasant Orientation:  Oriented to Self, Oriented to Situation, Oriented to Place, Oriented to  Time Alcohol / Substance use:  Not Applicable Psych involvement (Current and /or in the community):  No (Comment)  Discharge Needs  Concerns to be addressed:  Care Coordination Readmission within the last 30 days:  No Current discharge risk:  None Barriers to Discharge:  Continued Medical Work up   [employer], [place] 11/28/2016, 1:31 PM

## 2016-11-28 NOTE — Care Management Important Message (Signed)
Important Message  Patient Details  Name: [name] MRN: [id number] Date of Birth: Jun 02, 1940   Medicare Important Message Given:  Yes    Nathen May 11/28/2016, 10:41 AM

## 2016-11-28 NOTE — Evaluation (Signed)
Occupational Therapy Evaluation Patient Details Name: [name] MRN: [id number] DOB: 02-Jun-1940 Today's Date: 11/28/2016    History of Present Illness Patient is a 76 y/o female who presents with worsening SOB. Found to have acute on chronic respiratory failure secondary to COPD and decompensated CHF. CXR-pulmonary edema. PMH includes HTN, DM, chronic asthma, depression, COPD.   Clinical Impression   PTA, pt was living alone and had a caregiver who assisted with IADLs and bathing. Pt currently requiring Min A for UB ADLs, Mod A for LB ADLs, and Mod A for functional transfers with RW. Pt demonstrating decreased activity tolerance and endurance as seen by fatigue and decreased SpO2 during LB ADLs and mobility. Pt highly motivated to participate in therapy. Pt would benefit from acute OT to facilitate safe dc and increase occupational performance. Recommend dc to SNF for further OT to increase safety and independence with ADLs and functional mobility before returning home.     Follow Up Recommendations  SNF    Equipment Recommendations  Other (comment)(Defer to next venue)    Recommendations for Other Services PT consult     Precautions / Restrictions Precautions Precautions: Fall Restrictions Weight Bearing Restrictions: No      Mobility Bed Mobility Overal bed mobility: Needs Assistance Bed Mobility: Supine to Sit     Supine to sit: HOB elevated;Min guard     General bed mobility comments: Pt with use of bed rails and HOB elevated. Min Guard for safety and pt abel to bring legs to EOB and elevate trunk  Transfers Overall transfer level: Needs assistance Equipment used: Rolling walker (2 wheeled) Transfers: Sit to/from Stand Sit to Stand: Mod assist         General transfer comment: Assist to power to standing with cues for hand placement/technique. Cues to hand placement to decrease pulling up on RW    Balance Overall balance assessment: Needs  assistance Sitting-balance support: Feet supported;Single extremity supported Sitting balance-Leahy Scale: Fair     Standing balance support: During functional activity;Bilateral upper extremity supported Standing balance-Leahy Scale: Poor Standing balance comment: Reliant on BUEs for support in standing.                            ADL either performed or assessed with clinical judgement   ADL Overall ADL's : Needs assistance/impaired Eating/Feeding: Set up;Sitting   Grooming: Set up;Sitting   Upper Body Bathing: Minimal assistance;Sitting   Lower Body Bathing: Sit to/from stand;Moderate assistance   Upper Body Dressing : Minimal assistance;Sitting   Lower Body Dressing: Moderate assistance;Sit to/from stand Lower Body Dressing Details (indicate cue type and reason): Pt able to doff/don L sock while seated with increased time and effort. Pt requiring Mod A to don R sock. Pt would require physical A for standing balance during LB dressing Toilet Transfer: Minimal assistance;Ambulation;RW(simulated to recliner; short distance)           Functional mobility during ADLs: Moderate assistance;Rolling walker General ADL Comments: Pt requiring Mod A for LB ADLs as well as increased time and effort. Pt motivated to participate but dmeonstrating decreased activity tolerance. Pt SpO2 dropping to 84%during LB ADLs and 91% during mobility on RA. Pt returning to 95% with VCs for purse lip breathing.      Vision Baseline Vision/History: Wears glasses Wears Glasses: Reading only("I don't wear them all the time.") Patient Visual Report: No change from baseline       Perception  Praxis      Pertinent Vitals/Pain Pain Assessment: No/denies pain     Hand Dominance Right   Extremity/Trunk Assessment Upper Extremity Assessment Upper Extremity Assessment: Generalized weakness   Lower Extremity Assessment Lower Extremity Assessment: Defer to PT evaluation   Cervical /  Trunk Assessment Cervical / Trunk Assessment: Kyphotic;Other exceptions Cervical / Trunk Exceptions: Increased body habitus   Communication Communication Communication: No difficulties   Cognition Arousal/Alertness: Awake/alert Behavior During Therapy: WFL for tasks assessed/performed Overall Cognitive Status: No family/caregiver present to determine baseline cognitive functioning                                     General Comments  SpO2 dropping to 84 with activity but quickly returns to 95% at rest    Exercises     Shoulder Instructions      Home Living Family/patient expects to be discharged to:: Private residence Living Arrangements: Alone Available Help at Discharge: Friend(s);Personal care attendant;Available PRN/intermittently Type of Home: House Home Access: Level entry     Home Layout: One level     Bathroom Shower/Tub: Teacher, early years/pre: Standard     Home Equipment: Environmental consultant - 4 wheels;Bedside commode;Tub bench          Prior Functioning/Environment Level of Independence: Needs assistance  Gait / Transfers Assistance Needed: Uses rollator for ambulation. Does some cooking, but sits on stool to do so.  ADL's / Homemaking Assistance Needed: Has a aide come twice/week to assist with bathing.    Comments: Wears 3L/min 02 at night and PRN during the day.        OT Problem List: Decreased strength;Decreased range of motion;Decreased activity tolerance;Impaired balance (sitting and/or standing);Decreased safety awareness;Decreased knowledge of use of DME or AE      OT Treatment/Interventions: Self-care/ADL training;Therapeutic exercise;Energy conservation;DME and/or AE instruction;Therapeutic activities;Patient/family education    OT Goals(Current goals can be found in the care plan section) Acute Rehab OT Goals Patient Stated Goal: Get better OT Goal Formulation: With patient Time For Goal Achievement: 12/12/16 Potential to  Achieve Goals: Good ADL Goals Pt Will Perform Grooming: with set-up;with supervision;standing Pt Will Perform Upper Body Dressing: with set-up;with supervision;sitting Pt Will Perform Lower Body Dressing: with min guard assist;sit to/from stand;with adaptive equipment Pt Will Transfer to Toilet: with min guard assist;ambulating;bedside commode Pt Will Perform Toileting - Clothing Manipulation and hygiene: with min guard assist;sit to/from stand  OT Frequency: Min 2X/week   Barriers to D/C:            Co-evaluation              AM-PAC PT "6 Clicks" Daily Activity     Outcome Measure Help from another person eating meals?: A Little Help from another person taking care of personal grooming?: A Little Help from another person toileting, which includes using toliet, bedpan, or urinal?: A Lot Help from another person bathing (including washing, rinsing, drying)?: A Lot Help from another person to put on and taking off regular upper body clothing?: A Little Help from another person to put on and taking off regular lower body clothing?: A Lot 6 Click Score: 15   End of Session Equipment Utilized During Treatment: Gait belt;Rolling walker;Oxygen Nurse Communication: Mobility status;Other (comment)(SpO2)  Activity Tolerance: Patient tolerated treatment well Patient left: in chair;with call bell/phone within reach;with chair alarm set  OT Visit Diagnosis: Unsteadiness on feet (R26.81);Other abnormalities  of gait and mobility (R26.89);Muscle weakness (generalized) (M62.81)                Time: 2258-3462 OT Time Calculation (min): 38 min Charges:  OT General Charges $OT Visit: 1 Visit OT Evaluation $OT Eval Moderate Complexity: 1 Mod OT Treatments $Self Care/Home Management : 8-22 mins G-Codes:     Dio Giller MSOT, OTR/L Acute Rehab Pager: [phone] Office: [place] 11/28/2016, 4:50 PM

## 2016-11-28 NOTE — Progress Notes (Addendum)
[facility] Rounding Note  Subjective:  Good UOP past 2 days, only 600 today so far Only 2 weights done entire admission PT is recommending SNF Looks SO much better than at time I did her initial consult Sitting up in the chair. No O2, breathing well   Filed Weights   11/23/16 0500 11/27/16 6073  Weight: 103.7 kg (228 lb 9.9 oz) 105.6 kg (232 lb 11.2 oz)   Physical Exam:  Blood pressure (!) 143/59, pulse 98, temperature (!) 94.3 F (34.6 C), temperature source Rectal, resp. rate 18, height 5\' 3"  (1.6 m), weight 105.6 kg (232 lb 11.2 oz), SpO2 100 %. Obese, chronically ill appearing S1S2 No S3 Lungs clear without crackles or wheezes Severe chronic venous stasis changes in LEs below knees but with some wrinkling now Pitting thigh edema MARKEDLY better and only trace now   Scheduled Meds: . amLODipine  10 mg Oral Daily  . darbepoetin (ARANESP) injection - NON-DIALYSIS  60 mcg Subcutaneous Q Fri-1800  . fluticasone  2 spray Each Nare Daily  . folic acid  1 mg Oral Daily  . heparin  5,000 Units Subcutaneous Q8H  . hydrALAZINE  10 mg Oral Q8H  . insulin aspart  0-9 Units Subcutaneous TID WC  . insulin glargine  30 Units Subcutaneous Daily  . iron polysaccharides  150 mg Oral BID  . LORazepam  0.5 mg Oral q morning - 10a  . magnesium oxide  400 mg Oral Daily  . montelukast  10 mg Oral QHS  . omega-3 acid ethyl esters  1 g Oral Daily  . pantoprazole  40 mg Oral Daily  . polyethylene glycol  17 g Oral Daily  . polyvinyl alcohol  1 drop Both Eyes BID  . pyridOXINE  100 mg Oral Daily  . senna-docusate  2 tablet Oral BID  . sodium bicarbonate  650 mg Oral TID   Continuous Infusions: . furosemide 120 mg (11/28/16 1534)   PRN Meds:.acetaminophen **OR** acetaminophen, bisacodyl, guaiFENesin, hydrALAZINE, HYDROcodone-acetaminophen, ipratropium-albuterol, LORazepam, methocarbamol, ondansetron **OR** ondansetron (ZOFRAN) IV, prochlorperazine   Recent Labs  Lab 11/26/16 0450 11/27/16 0216  11/27/16 1423 11/27/16 1609 11/28/16 0529  NA 138 139 139 138 142  K 5.2* 5.1 5.6* 5.9* 4.3  CL 109 108 107 108 104  CO2 23 24 22  19* 28  GLUCOSE 122* 217* 199* 286* 223*  BUN 124* 130* 128* 130* 127*  CREATININE 3.34* 3.43* 3.33* 3.40* 3.26*  CALCIUM 7.4* 7.5* 7.9* 7.9* 7.9*  PHOS 5.4* 5.5*  --   --  4.7*   Recent Labs  Lab 11/22/16 0541 11/23/16 0725 11/24/16 0746 11/25/16 0410  WBC 6.2 8.1 8.0 6.6  NEUTROABS 4.4  --  7.0  --   HGB 9.2* 8.9* 8.0* 7.8*  HCT 28.0* 27.5* 24.7* 24.1*  MCV 92.4 91.4 90.5 91.3  PLT 139* 122* 114* 119*   Results for Govoni, MARYLOU WAGES (MRN [id number]) as of 11/28/2016 15:57  11/24/2016 16:06  PTH, Intact 118 (H)   Background 20F with CKD4, dCHF exacerbation, OSA/COPD, anemia. Pre admission baseline creatinine 2.2-2.6.  Assessment 1. CKD4, some AoCKD, nonproteinuric, small kidneys consistent with medical-renal disease; may be looking at new baseline mid 3's. Marked improvement in volume status by exam (I saw her initially, SO much better) 2. dCHF exacerbation (preserved EF) - improved 3. COPD with exacerbation - improved 4. Chronic LEE with venous stasis  5. HTN, stable; off ARB (should not resume) 6. DM2 7. OSA 8. GERD 9. 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 Cont with IV lasix 120 every 8 hours for another day or so then we can try to transition to po. 2. Probalbe SNF (per PT) 3. Daily weights (not being done) 4. She has some knowledge of HD but appears to be marginal candidate, cont to discuss with pt if appears more likely to be indicated 5. Cont ESA aranesp 59mcg q2wk given 11/16 (iron fine, tsat 47) 6. Start low dose calcitriol 0l25 TIW for secondary HPT 7. No further ACE or ARB for this pt  Jamal Maes, MD [facility] [phone] Pager 11/28/2016, 3:53 PM

## 2016-11-29 LAB — GLUCOSE, CAPILLARY
GLUCOSE-CAPILLARY: 84 mg/dL (ref 65–99)
GLUCOSE-CAPILLARY: 224 mg/dL — AB (ref 65–99)
Glucose-Capillary: 122 mg/dL — ABNORMAL HIGH (ref 65–99)
Glucose-Capillary: 150 mg/dL — ABNORMAL HIGH (ref 65–99)

## 2016-11-29 LAB — CBC
HCT: 26.1 % — ABNORMAL LOW (ref 36.0–46.0)
Hemoglobin: 8.6 g/dL — ABNORMAL LOW (ref 12.0–15.0)
MCH: 29.5 pg (ref 26.0–34.0)
MCHC: 33 g/dL (ref 30.0–36.0)
MCV: 89.4 fL (ref 78.0–100.0)
PLATELETS: 144 10*3/uL — AB (ref 150–400)
RBC: 2.92 MIL/uL — AB (ref 3.87–5.11)
RDW: 17.7 % — ABNORMAL HIGH (ref 11.5–15.5)
WBC: 7.1 10*3/uL (ref 4.0–10.5)

## 2016-11-29 LAB — RENAL FUNCTION PANEL
ALBUMIN: 2.8 g/dL — AB (ref 3.5–5.0)
ANION GAP: 9 (ref 5–15)
BUN: 126 mg/dL — ABNORMAL HIGH (ref 6–20)
CALCIUM: 7.8 mg/dL — AB (ref 8.9–10.3)
CO2: 30 mmol/L (ref 22–32)
CREATININE: 3.13 mg/dL — AB (ref 0.44–1.00)
Chloride: 103 mmol/L (ref 101–111)
GFR, EST AFRICAN AMERICAN: 16 mL/min — AB (ref >60)
GFR, EST NON AFRICAN AMERICAN: 14 mL/min — AB (ref >60)
Glucose, Bld: 118 mg/dL — ABNORMAL HIGH (ref 65–99)
PHOSPHORUS: 3.9 mg/dL (ref 2.5–4.6)
Potassium: 4 mmol/L (ref 3.5–5.1)
SODIUM: 142 mmol/L (ref 135–145)

## 2016-11-29 MED ORDER — SENNOSIDES-DOCUSATE SODIUM 8.6-50 MG PO TABS
1.0000 | ORAL_TABLET | Freq: Every day | ORAL | Status: DC
Start: 2016-11-30 — End: 2016-12-02
  Administered 2016-11-30 – 2016-12-01 (×2): 1 via ORAL
  Filled 2016-11-29 (×2): qty 1

## 2016-11-29 MED ORDER — TORSEMIDE 20 MG PO TABS
50.0000 mg | ORAL_TABLET | Freq: Two times a day (BID) | ORAL | Status: DC
  Administered 2016-11-30 – 2016-12-02 (×5): 50 mg via ORAL
  Filled 2016-11-29 (×5): qty 3

## 2016-11-29 NOTE — Progress Notes (Signed)
Physical Therapy Treatment Patient Details Name: [name] MRN: [id number] DOB: 1940-06-30 Today's Date: 11/29/2016    History of Present Illness Patient is a 76 y/o female who presents with worsening SOB. Found to have acute on chronic respiratory failure secondary to COPD and decompensated CHF. CXR-pulmonary edema. PMH includes HTN, DM, chronic asthma, depression, COPD.    PT Comments    Patient progressing slowly towards PT goals. Tolerated gait training today with Min guard assist for safety. Worked on STS from chair with focus on controlled descent and LE strengthening. Sp02 remained >90% on RA during session even though pt demonstrated 2/4 DOE. Continues to be appropriate for SNF. Will follow.   Follow Up Recommendations  SNF     Equipment Recommendations  None recommended by PT    Recommendations for Other Services OT consult     Precautions / Restrictions Precautions Precautions: Fall Restrictions Weight Bearing Restrictions: No    Mobility  Bed Mobility Overal bed mobility: Needs Assistance Bed Mobility: Supine to Sit     Supine to sit: HOB elevated;Min guard     General bed mobility comments: Pt with use of bed rails and HOB elevated. Min Guard for safety and pt able to bring legs to EOB and elevate trunk without assist. Increased time.  Transfers Overall transfer level: Needs assistance Equipment used: Rolling walker (2 wheeled) Transfers: Sit to/from [facility] Sit to Stand: Min guard;Min assist Stand pivot transfers: Min assist       General transfer comment: Initially requires Min A to power to standing from EOB progressing to Min guard assist from chair. SPT bed to chair with Min A.  Ambulation/Gait Ambulation/Gait assistance: Min guard Ambulation Distance (Feet): 10 Feet Assistive device: Rolling walker (2 wheeled) Gait Pattern/deviations: Decreased dorsiflexion - left;Decreased dorsiflexion - right;Shuffle;Trunk  flexed;Narrow base of support;Step-to pattern Gait velocity: decreased   General Gait Details: Pt with BLEs in ER down to feet, walking duck like with valgus positioning of bil knees; flexion bil knees noted. Increased effort. 2/4 DOE.    Stairs            Wheelchair Mobility    Modified Rankin (Stroke Patients Only)       Balance Overall balance assessment: Needs assistance Sitting-balance support: Feet supported;Single extremity supported Sitting balance-Leahy Scale: Fair     Standing balance support: During functional activity;Bilateral upper extremity supported Standing balance-Leahy Scale: Poor Standing balance comment: Reliant on BUEs for support in standing.                             Cognition Arousal/Alertness: Awake/alert Behavior During Therapy: WFL for tasks assessed/performed Overall Cognitive Status: No family/caregiver present to determine baseline cognitive functioning                                 General Comments: Seems WFL for mobility tasks.      Exercises Other Exercises Other Exercises: Sit to stand x3 from chair with cues for controlled descent for eccentric quad control and strengthening.    General Comments General comments (skin integrity, edema, etc.): Sp02 remained >90% on RA.       Pertinent Vitals/Pain Pain Assessment: No/denies pain    Home Living                      Prior Function  PT Goals (current goals can now be found in the care plan section) Progress towards PT goals: Progressing toward goals    Frequency           PT Plan Current plan remains appropriate    Co-evaluation              AM-PAC PT "6 Clicks" Daily Activity  Outcome Measure  Difficulty turning over in bed (including adjusting bedclothes, sheets and blankets)?: None Difficulty moving from lying on back to sitting on the side of the bed? : None Difficulty sitting down on and standing up  from a chair with arms (e.g., wheelchair, bedside commode, etc,.)?: A Little Help needed moving to and from a bed to chair (including a wheelchair)?: A Little Help needed walking in hospital room?: A Little Help needed climbing 3-5 steps with a railing? : Total 6 Click Score: 18    End of Session Equipment Utilized During Treatment: Gait belt Activity Tolerance: Patient tolerated treatment well Patient left: in chair;with call bell/phone within reach;with chair alarm set Nurse Communication: Mobility status PT Visit Diagnosis: Unsteadiness on feet (R26.81);Muscle weakness (generalized) (M62.81);Difficulty in walking, not elsewhere classified (R26.2)     Time: 1610-9604 PT Time Calculation (min) (ACUTE ONLY): 26 min  Charges:  $Therapeutic Exercise: 8-22 mins $Therapeutic Activity: 8-22 mins                    G Codes:       Wray Kearns, PT, DPT [phone]     Marguarite Arbour A Michaeal Davis 11/29/2016, 12:09 PM

## 2016-11-29 NOTE — Progress Notes (Signed)
[facility] Rounding Note  Subjective:  Good UOP Still on IV lasix Breathing continues to improve PT is recommending SNF Looks SO much better than at time I did her initial consult Appears weight down total of about 13 lb once she started diuresing  Filed Weights   11/23/16 0500 11/27/16 0632 11/29/16 0546  Weight: 103.7 kg (228 lb 9.9 oz) 105.6 kg (232 lb 11.2 oz) 99.6 kg (219 lb 9.6 oz)   Physical Exam:  Blood pressure (!) 143/59, pulse 98, temperature (!) 94.3 F (34.6 C), temperature source Rectal, resp. rate 18, height 5\' 3"  (1.6 m), weight 105.6 kg (232 lb 11.2 oz), SpO2 100 %. Obese AAF, very pleasant S1S2 No S3 Lungs clear without crackles or wheezes Severe chronic venous stasis changes in LEs below knees but with some wrinkling now Pitting thigh edema MARKEDLY better and only trace now   Scheduled Meds: . amLODipine  10 mg Oral Daily  . calcitRIOL  0.25 mcg Oral Q M,W,F  . darbepoetin (ARANESP) injection - NON-DIALYSIS  60 mcg Subcutaneous Q Fri-1800  . fluticasone  2 spray Each Nare Daily  . folic acid  1 mg Oral Daily  . heparin  5,000 Units Subcutaneous Q8H  . hydrALAZINE  10 mg Oral Q8H  . insulin aspart  0-9 Units Subcutaneous TID WC  . insulin glargine  30 Units Subcutaneous Daily  . iron polysaccharides  150 mg Oral BID  . LORazepam  0.5 mg Oral q morning - 10a  . magnesium oxide  400 mg Oral Daily  . montelukast  10 mg Oral QHS  . omega-3 acid ethyl esters  1 g Oral Daily  . pantoprazole  40 mg Oral Daily  . polyethylene glycol  17 g Oral Daily  . polyvinyl alcohol  1 drop Both Eyes BID  . pyridOXINE  100 mg Oral Daily  . [START ON 11/30/2016] senna-docusate  1 tablet Oral QHS  . sodium bicarbonate  650 mg Oral TID   Continuous Infusions: . furosemide Stopped (11/29/16 1018)   PRN Meds:.acetaminophen **OR** acetaminophen, bisacodyl, guaiFENesin, hydrALAZINE, HYDROcodone-acetaminophen, ipratropium-albuterol, LORazepam, methocarbamol, ondansetron **OR**  ondansetron (ZOFRAN) IV, prochlorperazine   Recent Labs  Lab 11/27/16 0216  11/27/16 1609 11/28/16 0529 11/29/16 0328  NA 139   < > 138 142 142  K 5.1   < > 5.9* 4.3 4.0  CL 108   < > 108 104 103  CO2 24   < > 19* 28 30  GLUCOSE 217*   < > 286* 223* 118*  BUN 130*   < > 130* 127* 126*  CREATININE 3.43*   < > 3.40* 3.26* 3.13*  CALCIUM 7.5*   < > 7.9* 7.9* 7.8*  PHOS 5.5*  --   --  4.7* 3.9   < > = values in this interval not displayed.   Recent Labs  Lab 11/24/16 0746 11/25/16 0410 11/29/16 0328  WBC 8.0 6.6 7.1  NEUTROABS 7.0  --   --   HGB 8.0* 7.8* 8.6*  HCT 24.7* 24.1* 26.1*  MCV 90.5 91.3 89.4  PLT 114* 119* 144*   Results for Ratterman, KEIASHA DIEP (MRN [id number]) as of 11/28/2016 15:57  11/24/2016 16:06  PTH, Intact 118 (H)   Background 17F with CKD4, dCHF exacerbation, OSA/COPD, anemia. Pre admission baseline creatinine 2.2-2.6.  Assessment 1. CKD4, some AoCKD, nonproteinuric, small kidneys consistent with medical-renal disease; may be looking at new baseline mid 3's. Marked improvement in volume status by exam (I saw her initially, SO much  better). Good response to 120 IV lasix TID  - this would probably equate to 240 of po lasix tid - might get more reliable response from torsemide (absorption wise). Recommend transition to po torsemide. Start with 50 mg BID and see if adequate diuretic response, increase or decrease from there. Start in AM 2. dCHF exacerbation (preserved EF) - improved 3. COPD with exacerbation - improved 4. Chronic LEE with venous stasis  5. HTN, stable; off ARB (should not resume) 6. DM2 7. OSA 8. GERD 9. Anemia - Cont ESA aranesp 59mcg qwk given 11/16 (iron fine, tsat 47). Transition to [facility] at [facility] when discharged.  10. Secondary HPT - Started low dose calcitriol 5.11 TIW  11. Metabolic acidosis - oral sodium bicarb 12. Disposition -  probable SNF 13. She will have a follow up with me at [place] on Dec 12 to arrive at  12:15  Jamal Maes, MD [place] Pager 11/29/2016, 1:59 PM

## 2016-11-29 NOTE — Progress Notes (Signed)
Inpatient Diabetes Program Recommendations  AACE/ADA: New Consensus Statement on Inpatient Glycemic Control (2015)  Target Ranges:  Prepandial:   less than 140 mg/dL      Peak postprandial:   less than 180 mg/dL (1-2 hours)      Critically ill patients:  140 - 180 mg/dL   Lab Results  Component Value Date   GLUCAP 122 (H) 11/29/2016   HGBA1C 6.7 (H) 11/22/2016    Inpatient Diabetes;   Met with patient to discuss timing of home insulin.  She is taking Humalog 75/25 insulin in the morning, "sometimes before breakfast and sometimes after".  She tells me sometimes she doesn't even eat breakfast but tells me she has cheese and crackers to eat.  Patient eats lunch and supper but no hs snack.  She generally eats at 8pm for dinner.  Patient reports she takes Lantus 30 units qhs and Glucotrol twice a day.  She does report occasional low blood sugar which she treats with 1/2 regular soda. Discussed the mechanism of action for both Humalog and Lantus insulin.  Reviewed the plate method of eating- encouraging her to try to limit each meal to 2-3 carb servings. Encouraged her to eat her 3 meals a day to avoid low blood sugars and to always eat when she takes her Humalog.  Gentry Fitz, RN, BA, MHA, CDE Diabetes Coordinator Inpatient Diabetes Program  [phone] (Team Pager) [phone] ([place]) 11/29/2016 3:32 PM

## 2016-11-29 NOTE — Progress Notes (Signed)
PROGRESS NOTE  [name] [name]:[id number] DOB: [birth year] DOA: 11/22/2016 PCP: Burnard Bunting, MD  HPI/Recap of past 24 hours:  [name] is a 76 y.o. year old female with medical history significant for type 2 diabetes, CKD stage III s/p right renal carcinoma ( s/p cryoablation 07/2009), hypertension, COPD, obstructive sleep apnea on CPAP, hyperlipidemia who presented on 11/22/2016 with worsening dyspnea and was found to have acute on chronic respiratory failure presumed secondary to combined COPD exacerbation and acute exacerbation of chronic diastolic heart failure.   No new complaints. Having too many BMs with new bowel regimen. Reports continued improvement in breathing  Assessment/Plan: Active Problems:   COPD with asthma (HCC)   Allergic rhinitis   Anemia   Osteoarthritis   OSA (obstructive sleep apnea)   Insomnia   COPD exacerbation (HCC)   Acute exacerbation of CHF (congestive heart failure) (HCC)   Acute on chronic respiratory failure with hypoxia (HCC)   Acute renal failure superimposed on stage 3 chronic kidney disease (HCC)   Metabolic acidosis with normal anion gap and bicarbonate losses  1.AKI on CKD, likely secondary to CHF exacerbation and progression of CKD, improving Hyperkalemia, resolved Non Anion Gap Metabolic Acidosis, resolved Creatinine downtrending in setting of aggressive IV diuresis (lasix 120 mg TID) with net negative 8 L during hospital course. Weights have not been monitored to see correlation in output but given continued improvement in creatinine seem to be getting fluid off.  Likely progression of CKD given no acute insult with renal u/s consistent with medical renal disease.  Nephrology consulted Oral sodium bicarb, may need to d/c or decrease as Bicarb 28 today ( mixed picture of bicarb and chronic retainer from COPD?) Transition to oral torsemide for further diuresis, monitor volume status to assure continued progress Monitor BMPs, strict  intake and output, avoiding nephrotoxins  2.Acute on chronic respiratory failure secondary, resolved  Multifactorial (COPD, CHF exacerbation, ARF on CKD).  At home oxygen requirement at rest, but unsure of requirements when mobile. -Continue supplemental oxygen, - Continue inhaler regimen --Diuresis as mentioned above -- PT recommends SNF  3.Acute exacerbation of chronic diastolic heart failure, hypervolemic , improving TTE on admission  shows preserved EF, grade 2 diastolic function.  Chest x-ray on admission with significant interstitial edema, right-sided pleural effusion and cardiomegaly.  BNP 74 however unhelpful in setting of obesity - Torsemide as mentioned aove -Daily weights, strict intake and output, follow BMP BID (given increased diuresis)   4.COPD exacerbation.  Resolved  Unclear etiology denies any recent sick contacts.  Reports adherence with medication regimen.  Possibly related to CHF exacerbation -PRN duo nebs, - p.o. Levaquin and Oral prednisone completed - supportive care: Mucinex, Singulair  5.Hypertension, improving Initially during admission only on home amlodipine. Limited as losartan held in setting of AKI on CKD and hyperkalemia. Goal 140/90. Range in 24 hours 150-180s-60-70 Home amlodipine Holding home losartan in setting of persistent hyperkalemia and AKI Po hydralazine at low dose ( may need to increase), may see some improvement with diuresis PRN IV hydralazine for SBP greater than 170  6.Type 2 diabetes, controlled.  A1c 6.7 (11/22/2016) -Home Lantus 30 units, NovoLog correction coverage  7.GERD.  Stable -Home Protonix   8. OSA on CPAP . Stable Could be contributing to increased PA pressures seen on TTE. Patient non-adherent with BIPAP at home  9. Anemia of CKD  aranesp as directed by renal  Code Status: Full code  Family Communication: No family at bedside  Disposition  Plan: Monitor I/o and volume status on oral diuresis, monitoring kidney  function, awaiting SNF once oral diuresis regimen acheived  Consultants:  Nephrology: Dr. Joelyn Oms  Procedures:  TTE 11: EF 60-65 %, wall motion normal, grade 2 diastolic dysfunction, mild aortic stenosis, moderate-severe mitral regurgitation. Severely dilated left and right atrium. Pulmonary arteries severely increased to 72 mmHg  Antimicrobials:  Levaquin 11/22/2016-11/27/16  Cultures:  None  DVT prophylaxis: Heparin   Objective: Vitals:   11/29/16 0546 11/29/16 0644 11/29/16 0917 11/29/16 1422  BP: (!) 181/66 (!) 157/57 (!) 174/58 (!) 148/55  Pulse: 60 (!) 55  65  Resp: 18   18  Temp: 97.9 F (36.6 C)     TempSrc: Oral     SpO2: 99%   99%  Weight: 99.6 kg (219 lb 9.6 oz)     Height:        Intake/Output Summary (Last 24 hours) at 11/29/2016 1853 Last data filed at 11/29/2016 1704 Gross per 24 hour  Intake 282 ml  Output 1800 ml  Net -1518 ml   Filed Weights   11/23/16 0500 11/27/16 9604 11/29/16 0546  Weight: 103.7 kg (228 lb 9.9 oz) 105.6 kg (232 lb 11.2 oz) 99.6 kg (219 lb 9.6 oz)    Exam:   General: Lying in bed, in no apparent distress  Cardiovascular:1+pitting edema of bilateral thighs, 3/6 SEM loudest in LUSB, no radiation   Respiratory: minimal crackles at bases, no appreciable wheezes, on supplemental oxygen  Abdomen: Soft, non-distended, mild tenderness in center of abdomen, no guarding or rebound tenderness  Skin: Chronic skin changes on bilateral lower extremities from ankle to below knee: Darkly hyperpigmented skin.    Neurologic: Alert and oriented x3   Psychiatry: Appropriate affect and mood   Data Reviewed: CBC: Recent Labs  Lab 11/23/16 0725 11/24/16 0746 11/25/16 0410 11/29/16 0328  WBC 8.1 8.0 6.6 7.1  NEUTROABS  --  7.0  --   --   HGB 8.9* 8.0* 7.8* 8.6*  HCT 27.5* 24.7* 24.1* 26.1*  MCV 91.4 90.5 91.3 89.4  PLT 122* 114* 119* 540*   Basic Metabolic Panel: Recent Labs  Lab 11/25/16 0410 11/26/16 0450  11/27/16 0216 11/27/16 1423 11/27/16 1609 11/28/16 0529 11/29/16 0328  NA 139 138 139 139 138 142 142  K 4.9 5.2* 5.1 5.6* 5.9* 4.3 4.0  CL 111 109 108 107 108 104 103  CO2 22 23 24 22  19* 28 30  GLUCOSE 108* 122* 217* 199* 286* 223* 118*  BUN 112* 124* 130* 128* 130* 127* 126*  CREATININE 3.11* 3.34* 3.43* 3.33* 3.40* 3.26* 3.13*  CALCIUM 7.6* 7.4* 7.5* 7.9* 7.9* 7.9* 7.8*  PHOS 5.4* 5.4* 5.5*  --   --  4.7* 3.9   GFR: Estimated Creatinine Clearance: 17.5 mL/min (A) (by C-G formula based on SCr of 3.13 mg/dL (H)). Liver Function Tests: Recent Labs  Lab 11/23/16 0725 11/25/16 0410 11/26/16 0450 11/27/16 0216 11/28/16 0529 11/29/16 0328  AST 43*  --   --   --   --   --   ALT 101*  --   --   --   --   --   ALKPHOS 119  --   --   --   --   --   BILITOT 0.6  --   --   --   --   --   PROT 6.9  --   --   --   --   --   ALBUMIN 3.0* 2.7*  2.6* 2.7* 3.0* 2.8*   No results for input(s): LIPASE, AMYLASE in the last 168 hours. No results for input(s): AMMONIA in the last 168 hours. Coagulation Profile: Recent Labs  Lab 11/23/16 0725  INR 1.23   Cardiac Enzymes: No results for input(s): CKTOTAL, CKMB, CKMBINDEX, TROPONINI in the last 168 hours. BNP (last 3 results) No results for input(s): PROBNP in the last 8760 hours. HbA1C: No results for input(s): HGBA1C in the last 72 hours. CBG: Recent Labs  Lab 11/28/16 1631 11/28/16 2155 11/29/16 0750 11/29/16 1155 11/29/16 1700  GLUCAP 206* 153* 84 122* 150*   Lipid Profile: No results for input(s): CHOL, HDL, LDLCALC, TRIG, CHOLHDL, LDLDIRECT in the last 72 hours. Thyroid Function Tests: No results for input(s): TSH, T4TOTAL, FREET4, T3FREE, THYROIDAB in the last 72 hours. Anemia Panel: No results for input(s): VITAMINB12, FOLATE, FERRITIN, TIBC, IRON, RETICCTPCT in the last 72 hours. Urine analysis:    Component Value Date/Time   COLORURINE STRAW (A) 11/22/2016 1643   APPEARANCEUR CLEAR 11/22/2016 1643   LABSPEC  1.008 11/22/2016 1643   PHURINE 5.0 11/22/2016 1643   GLUCOSEU NEGATIVE 11/22/2016 1643   HGBUR NEGATIVE 11/22/2016 1643   BILIRUBINUR NEGATIVE 11/22/2016 1643   KETONESUR NEGATIVE 11/22/2016 1643   PROTEINUR NEGATIVE 11/22/2016 1643   UROBILINOGEN 0.2 12/16/2011 1717   NITRITE NEGATIVE 11/22/2016 1643   LEUKOCYTESUR TRACE (A) 11/22/2016 1643   Sepsis Labs: @LABRCNTIP (procalcitonin:4,lacticidven:4)  ) Recent Results (from the past 240 hour(s))  Respiratory Panel by PCR     Status: None   Collection Time: 11/23/16  2:10 PM  Result Value Ref Range Status   Adenovirus NOT DETECTED NOT DETECTED Final   Coronavirus 229E NOT DETECTED NOT DETECTED Final   Coronavirus HKU1 NOT DETECTED NOT DETECTED Final   Coronavirus NL63 NOT DETECTED NOT DETECTED Final   Coronavirus OC43 NOT DETECTED NOT DETECTED Final   Metapneumovirus NOT DETECTED NOT DETECTED Final   Rhinovirus / Enterovirus NOT DETECTED NOT DETECTED Final   Influenza A NOT DETECTED NOT DETECTED Final   Influenza B NOT DETECTED NOT DETECTED Final   Parainfluenza Virus 1 NOT DETECTED NOT DETECTED Final   Parainfluenza Virus 2 NOT DETECTED NOT DETECTED Final   Parainfluenza Virus 3 NOT DETECTED NOT DETECTED Final   Parainfluenza Virus 4 NOT DETECTED NOT DETECTED Final   Respiratory Syncytial Virus NOT DETECTED NOT DETECTED Final   Bordetella pertussis NOT DETECTED NOT DETECTED Final   Chlamydophila pneumoniae NOT DETECTED NOT DETECTED Final   Mycoplasma pneumoniae NOT DETECTED NOT DETECTED Final      Studies: No results found.  Scheduled Meds: . amLODipine  10 mg Oral Daily  . calcitRIOL  0.25 mcg Oral Q M,W,F  . darbepoetin (ARANESP) injection - NON-DIALYSIS  60 mcg Subcutaneous Q Fri-1800  . fluticasone  2 spray Each Nare Daily  . folic acid  1 mg Oral Daily  . heparin  5,000 Units Subcutaneous Q8H  . hydrALAZINE  10 mg Oral Q8H  . insulin aspart  0-9 Units Subcutaneous TID WC  . insulin glargine  30 Units Subcutaneous  Daily  . iron polysaccharides  150 mg Oral BID  . LORazepam  0.5 mg Oral q morning - 10a  . magnesium oxide  400 mg Oral Daily  . montelukast  10 mg Oral QHS  . omega-3 acid ethyl esters  1 g Oral Daily  . pantoprazole  40 mg Oral Daily  . polyethylene glycol  17 g Oral Daily  . polyvinyl alcohol  1  drop Both Eyes BID  . pyridOXINE  100 mg Oral Daily  . [START ON 11/30/2016] senna-docusate  1 tablet Oral QHS  . sodium bicarbonate  650 mg Oral TID  . [START ON 11/30/2016] torsemide  50 mg Oral BID    Continuous Infusions: . furosemide Stopped (11/29/16 1653)     LOS: 5 days     Desiree Hane, MD [facility] Pager [phone]  If 7PM-7AM, please contact night-coverage [email or web address] Password [facility] 11/29/2016, 6:53 PM

## 2016-11-29 NOTE — Progress Notes (Signed)
Inpatient Diabetes Program Recommendations  AACE/ADA: New Consensus Statement on Inpatient Glycemic Control (2015)  Target Ranges:  Prepandial:   less than 140 mg/dL      Peak postprandial:   less than 180 mg/dL (1-2 hours)      Critically ill patients:  140 - 180 mg/dL   Lab Results  Component Value Date   GLUCAP 84 11/29/2016   HGBA1C 6.7 (H) 11/22/2016    Review of Glycemic Control  Results for [name], [name] (MRN [id number]) as of 11/29/2016 10:38  Ref. Range 11/28/2016 08:19 11/28/2016 11:52 11/28/2016 16:31 11/28/2016 21:55 11/29/2016 07:50  Glucose-Capillary Latest Ref Range: 65 - 99 mg/dL 166 (H) 278 (H) 206 (H) 153 (H) 84   Diabetes history: Type 2 Outpatient Diabetes medications: Glucotrol 10mg  bid, Humalog 75/25 15 units qhs, Lantus 30 units qday  Current orders for Inpatient glycemic control: Lantus 30 units qday, Novolog 0-9 units tid  Inpatient Diabetes Program Recommendations: Very concerning home insulin regime- will discuss with patient this afternoon- it appears she is taking 75/25 insulin at hs.   Staff - please ensure that insulin Novolog sliding scale is given within 1 hour of the blood sugar being obtained- I believe the blood sugars are not well controlled because of this. This also puts the patient at high risk for hypoglycemia.   Gentry Fitz, RN, BA, MHA, CDE Diabetes Coordinator Inpatient Diabetes Program  [phone] (Team Pager) [phone] ([place]) 11/29/2016 10:53 AM

## 2016-11-29 NOTE — Progress Notes (Signed)
Pt refused cpap for the night.  

## 2016-11-29 NOTE — Progress Notes (Signed)
Pt refused CPAP tonight. Ranelle Oyster, RN

## 2016-11-30 ENCOUNTER — Inpatient Hospital Stay (HOSPITAL_COMMUNITY): Payer: Medicare Other

## 2016-11-30 LAB — RENAL FUNCTION PANEL
ALBUMIN: 2.9 g/dL — AB (ref 3.5–5.0)
Anion gap: 11 (ref 5–15)
BUN: 118 mg/dL — AB (ref 6–20)
CALCIUM: 8.1 mg/dL — AB (ref 8.9–10.3)
CO2: 31 mmol/L (ref 22–32)
CREATININE: 2.95 mg/dL — AB (ref 0.44–1.00)
Chloride: 99 mmol/L — ABNORMAL LOW (ref 101–111)
GFR calc Af Amer: 17 mL/min — ABNORMAL LOW (ref >60)
GFR calc non Af Amer: 15 mL/min — ABNORMAL LOW (ref >60)
GLUCOSE: 131 mg/dL — AB (ref 65–99)
PHOSPHORUS: 4.3 mg/dL (ref 2.5–4.6)
Potassium: 4.4 mmol/L (ref 3.5–5.1)
SODIUM: 141 mmol/L (ref 135–145)

## 2016-11-30 LAB — GLUCOSE, CAPILLARY
GLUCOSE-CAPILLARY: 227 mg/dL — AB (ref 65–99)
Glucose-Capillary: 103 mg/dL — ABNORMAL HIGH (ref 65–99)
Glucose-Capillary: 138 mg/dL — ABNORMAL HIGH (ref 65–99)
Glucose-Capillary: 228 mg/dL — ABNORMAL HIGH (ref 65–99)

## 2016-11-30 NOTE — Progress Notes (Signed)
PROGRESS NOTE  [name] QZR:[id number] DOB: 1940/06/21 DOA: 11/22/2016 PCP: Burnard Bunting, MD  HPI/Recap of past 24 hours:  [name] is a 76 y.o. year old female with medical history significant for type 2 diabetes, CKD stage III s/p right renal carcinoma ( s/p cryoablation 07/2009), hypertension, COPD, obstructive sleep apnea on CPAP, hyperlipidemia who presented on 11/22/2016 with worsening dyspnea and was found to have acute on chronic respiratory failure presumed secondary to combined COPD exacerbation and acute exacerbation of chronic diastolic heart failure.  No new complaints reported to me.   Assessment/Plan: Active Problems:   COPD with asthma (HCC)   Allergic rhinitis   Anemia   Osteoarthritis   OSA (obstructive sleep apnea)   Insomnia   COPD exacerbation (HCC)   Acute exacerbation of CHF (congestive heart failure) (HCC)   Acute on chronic respiratory failure with hypoxia (HCC)   Acute renal failure superimposed on stage 3 chronic kidney disease (HCC)   Metabolic acidosis with normal anion gap and bicarbonate losses  1.AKI on CKD, likely secondary to CHF exacerbation and progression of CKD, improving Hyperkalemia, resolved Non Anion Gap Metabolic Acidosis, resolved Creatinine downtrending in setting of aggressive IV diuresis (lasix 120 mg TID) with net negative 8 L during hospital course. Weights have not been monitored to see correlation in output but given continued improvement in creatinine seem to be getting fluid off.  Likely progression of CKD given no acute insult with renal u/s consistent with medical renal disease.  Nephrology consulted Oral sodium bicarb, may need to d/c or decrease as Bicarb 28 today ( mixed picture of bicarb and chronic retainer from COPD?) Transitioned to oral torsemide for further diuresis Monitor BMPs, strict intake and output, avoiding nephrotoxins  2.Acute on chronic respiratory failure secondary, resolved  Multifactorial  (COPD, CHF exacerbation, ARF on CKD).  At home oxygen requirement at rest, but unsure of requirements when mobile. -Continue supplemental oxygen, - Continue inhaler regimen --Diuresis as mentioned above -- PT recommends SNF  3.Acute exacerbation of chronic diastolic heart failure, hypervolemic , improving TTE on admission  shows preserved EF, grade 2 diastolic function.  Chest x-ray on admission with significant interstitial edema, right-sided pleural effusion and cardiomegaly.  BNP 75 however unhelpful in setting of obesity - Torsemide as mentioned aove -Daily weights, strict intake and output, follow BMP    4.COPD exacerbation.  Resolved  Unclear etiology denies any recent sick contacts.  Reports adherence with medication regimen.  Possibly related to CHF exacerbation -PRN duo nebs, - p.o. Levaquin and Oral prednisone completed - supportive care: Mucinex, Singulair  5.Hypertension, improving Initially during admission only on home amlodipine. Limited as losartan held in setting of AKI on CKD and hyperkalemia. Goal 140/90. Range in 24 hours 150-180s-60-70 Home amlodipine Holding home losartan in setting of persistent hyperkalemia and AKI Po hydralazine at low dose ( may need to increase), may see some improvement with diuresis PRN IV hydralazine for SBP greater than 170  6.Type 2 diabetes, controlled.  A1c 6.7 (11/22/2016) -Home Lantus 30 units, NovoLog correction coverage  7.GERD.  Stable -Home Protonix   8. OSA on CPAP . Stable Could be contributing to increased PA pressures seen on TTE. Patient non-adherent with BIPAP at home  9. Anemia of CKD  aranesp as directed by renal  Code Status: Full code  Family Communication: No family at bedside  Disposition Plan: Monitor I/o and volume status on oral diuresis, monitoring kidney function, awaiting SNF once oral diuresis regimen acheived  Consultants:  Nephrology: Dr. Joelyn Oms  Procedures:  TTE 11: EF 60-65 %, wall motion  normal, grade 2 diastolic dysfunction, mild aortic stenosis, moderate-severe mitral regurgitation. Severely dilated left and right atrium. Pulmonary arteries severely increased to 72 mmHg  Antimicrobials:  Levaquin 11/22/2016-11/27/16  Cultures:  None  DVT prophylaxis: Heparin   Objective: Vitals:   11/30/16 0524 11/30/16 0531 11/30/16 0921 11/30/16 1457  BP: (!) 182/59 (!) 182/59 (!) 166/60 (!) 164/54  Pulse: 60 60    Resp: 16 16  15   Temp: (!) 97.5 F (36.4 C) (!) 97.5 F (36.4 C)  (!) 96 F (35.6 C)  TempSrc: Oral Oral  Axillary  SpO2: 100% 100%  100%  Weight:      Height:        Intake/Output Summary (Last 24 hours) at 11/30/2016 1732 Last data filed at 11/30/2016 1233 Gross per 24 hour  Intake 220 ml  Output 1900 ml  Net -1680 ml   Filed Weights   11/27/16 4008 11/29/16 0546 11/30/16 0500  Weight: 105.6 kg (232 lb 11.2 oz) 99.6 kg (219 lb 9.6 oz) 95.8 kg (211 lb 3.2 oz)    Exam:   General: Lying in bed, in nad  Cardiovascular:1+pitting edema of bilateral thighs, 3/6 SEM loudest in LUSB, no radiation   Respiratory: minimal crackles at bases, no appreciable wheezes, on supplemental oxygen  Abdomen: Soft, non-distended, mild tenderness in center of abdomen, no guarding or rebound tenderness  Skin: Chronic skin changes on bilateral lower extremities from ankle to below knee: Darkly hyperpigmented skin.    Neurologic: Alert and oriented x3   Psychiatry: Appropriate affect and mood   Data Reviewed: CBC: Recent Labs  Lab 11/24/16 0746 11/25/16 0410 11/29/16 0328  WBC 8.0 6.6 7.1  NEUTROABS 7.0  --   --   HGB 8.0* 7.8* 8.6*  HCT 24.7* 24.1* 26.1*  MCV 90.5 91.3 89.4  PLT 114* 119* 676*   Basic Metabolic Panel: Recent Labs  Lab 11/26/16 0450 11/27/16 0216 11/27/16 1423 11/27/16 1609 11/28/16 0529 11/29/16 0328 11/30/16 0546  NA 138 139 139 138 142 142 141  K 5.2* 5.1 5.6* 5.9* 4.3 4.0 4.4  CL 109 108 107 108 104 103 99*  CO2 23 24 22   19* 28 30 31   GLUCOSE 122* 217* 199* 286* 223* 118* 131*  BUN 124* 130* 128* 130* 127* 126* 118*  CREATININE 3.34* 3.43* 3.33* 3.40* 3.26* 3.13* 2.95*  CALCIUM 7.4* 7.5* 7.9* 7.9* 7.9* 7.8* 8.1*  PHOS 5.4* 5.5*  --   --  4.7* 3.9 4.3   GFR: Estimated Creatinine Clearance: 18.2 mL/min (A) (by C-G formula based on SCr of 2.95 mg/dL (H)). Liver Function Tests: Recent Labs  Lab 11/26/16 0450 11/27/16 0216 11/28/16 0529 11/29/16 0328 11/30/16 0546  ALBUMIN 2.6* 2.7* 3.0* 2.8* 2.9*   No results for input(s): LIPASE, AMYLASE in the last 168 hours. No results for input(s): AMMONIA in the last 168 hours. Coagulation Profile: No results for input(s): INR, PROTIME in the last 168 hours. Cardiac Enzymes: No results for input(s): CKTOTAL, CKMB, CKMBINDEX, TROPONINI in the last 168 hours. BNP (last 3 results) No results for input(s): PROBNP in the last 8760 hours. HbA1C: No results for input(s): HGBA1C in the last 72 hours. CBG: Recent Labs  Lab 11/29/16 1700 11/29/16 2116 11/30/16 0758 11/30/16 1151 11/30/16 1651  GLUCAP 150* 224* 103* 138* 228*   Lipid Profile: No results for input(s): CHOL, HDL, LDLCALC, TRIG, CHOLHDL, LDLDIRECT in the last 72 hours. Thyroid  Function Tests: No results for input(s): TSH, T4TOTAL, FREET4, T3FREE, THYROIDAB in the last 72 hours. Anemia Panel: No results for input(s): VITAMINB12, FOLATE, FERRITIN, TIBC, IRON, RETICCTPCT in the last 72 hours. Urine analysis:    Component Value Date/Time   COLORURINE STRAW (A) 11/22/2016 1643   APPEARANCEUR CLEAR 11/22/2016 1643   LABSPEC 1.008 11/22/2016 1643   PHURINE 5.0 11/22/2016 1643   GLUCOSEU NEGATIVE 11/22/2016 1643   HGBUR NEGATIVE 11/22/2016 1643   BILIRUBINUR NEGATIVE 11/22/2016 1643   KETONESUR NEGATIVE 11/22/2016 1643   PROTEINUR NEGATIVE 11/22/2016 1643   UROBILINOGEN 0.2 12/16/2011 1717   NITRITE NEGATIVE 11/22/2016 1643   LEUKOCYTESUR TRACE (A) 11/22/2016 1643   Sepsis  Labs: @LABRCNTIP (procalcitonin:4,lacticidven:4)  ) Recent Results (from the past 240 hour(s))  Respiratory Panel by PCR     Status: None   Collection Time: 11/23/16  2:10 PM  Result Value Ref Range Status   Adenovirus NOT DETECTED NOT DETECTED Final   Coronavirus 229E NOT DETECTED NOT DETECTED Final   Coronavirus HKU1 NOT DETECTED NOT DETECTED Final   Coronavirus NL63 NOT DETECTED NOT DETECTED Final   Coronavirus OC43 NOT DETECTED NOT DETECTED Final   Metapneumovirus NOT DETECTED NOT DETECTED Final   Rhinovirus / Enterovirus NOT DETECTED NOT DETECTED Final   Influenza A NOT DETECTED NOT DETECTED Final   Influenza B NOT DETECTED NOT DETECTED Final   Parainfluenza Virus 1 NOT DETECTED NOT DETECTED Final   Parainfluenza Virus 2 NOT DETECTED NOT DETECTED Final   Parainfluenza Virus 3 NOT DETECTED NOT DETECTED Final   Parainfluenza Virus 4 NOT DETECTED NOT DETECTED Final   Respiratory Syncytial Virus NOT DETECTED NOT DETECTED Final   Bordetella pertussis NOT DETECTED NOT DETECTED Final   Chlamydophila pneumoniae NOT DETECTED NOT DETECTED Final   Mycoplasma pneumoniae NOT DETECTED NOT DETECTED Final      Studies: No results found.  Scheduled Meds: . amLODipine  10 mg Oral Daily  . calcitRIOL  0.25 mcg Oral Q M,W,F  . darbepoetin (ARANESP) injection - NON-DIALYSIS  60 mcg Subcutaneous Q Fri-1800  . fluticasone  2 spray Each Nare Daily  . folic acid  1 mg Oral Daily  . heparin  5,000 Units Subcutaneous Q8H  . hydrALAZINE  10 mg Oral Q8H  . insulin aspart  0-9 Units Subcutaneous TID WC  . insulin glargine  30 Units Subcutaneous Daily  . iron polysaccharides  150 mg Oral BID  . LORazepam  0.5 mg Oral q morning - 10a  . magnesium oxide  400 mg Oral Daily  . montelukast  10 mg Oral QHS  . omega-3 acid ethyl esters  1 g Oral Daily  . pantoprazole  40 mg Oral Daily  . polyethylene glycol  17 g Oral Daily  . polyvinyl alcohol  1 drop Both Eyes BID  . pyridOXINE  100 mg Oral Daily   . senna-docusate  1 tablet Oral QHS  . torsemide  50 mg Oral BID    Continuous Infusions:    LOS: 6 days     Velvet Bathe, MD [facility] Pager [phone]  If 7PM-7AM, please contact night-coverage [email or web address] Password [facility] 11/30/2016, 5:32 PM

## 2016-11-30 NOTE — Progress Notes (Signed)
[facility] Rounding Note  Subjective:  Transition to po torsemide 1st day of po Good UOP yest while still on IV lasix Weight 232->211 Wearing O2 since last night and says has developed a cough  Filed Weights   11/27/16 0632 11/29/16 0546 11/30/16 0500  Weight: 105.6 kg (232 lb 11.2 oz) 99.6 kg (219 lb 9.6 oz) 95.8 kg (211 lb 3.2 oz)   Physical Exam:   BP (!) 166/60   Pulse 60   Temp (!) 97.5 F (36.4 C) (Oral)   Resp 16   Ht 5\' 3"  (1.6 m)   Wt 95.8 kg (211 lb 3.2 oz) Comment: Rn rezeroed bed   SpO2 100%   BMI 37.41 kg/m  Obese AAF, very pleasant S1S2 No S3 Lungs clear anteriorly, no definite crackles Severe chronic venous stasis changes in LEs below knees but with a lot of skin wrinkling from fluid loss Pitting thigh edema MARKEDLY better and only trace now   Scheduled Meds: . amLODipine  10 mg Oral Daily  . calcitRIOL  0.25 mcg Oral Q M,W,F  . darbepoetin (ARANESP) injection - NON-DIALYSIS  60 mcg Subcutaneous Q Fri-1800  . fluticasone  2 spray Each Nare Daily  . folic acid  1 mg Oral Daily  . heparin  5,000 Units Subcutaneous Q8H  . hydrALAZINE  10 mg Oral Q8H  . insulin aspart  0-9 Units Subcutaneous TID WC  . insulin glargine  30 Units Subcutaneous Daily  . iron polysaccharides  150 mg Oral BID  . LORazepam  0.5 mg Oral q morning - 10a  . magnesium oxide  400 mg Oral Daily  . montelukast  10 mg Oral QHS  . omega-3 acid ethyl esters  1 g Oral Daily  . pantoprazole  40 mg Oral Daily  . polyethylene glycol  17 g Oral Daily  . polyvinyl alcohol  1 drop Both Eyes BID  . pyridOXINE  100 mg Oral Daily  . senna-docusate  1 tablet Oral QHS  . sodium bicarbonate  650 mg Oral TID  . torsemide  50 mg Oral BID    PRN Meds:.acetaminophen **OR** acetaminophen, bisacodyl, guaiFENesin, hydrALAZINE, HYDROcodone-acetaminophen, ipratropium-albuterol, LORazepam, methocarbamol, ondansetron **OR** ondansetron (ZOFRAN) IV, prochlorperazine   Recent Labs  Lab 11/28/16 0529  11/29/16 0328 11/30/16 0546  NA 142 142 141  K 4.3 4.0 4.4  CL 104 103 99*  CO2 28 30 31   GLUCOSE 223* 118* 131*  BUN 127* 126* 118*  CREATININE 3.26* 3.13* 2.95*  CALCIUM 7.9* 7.8* 8.1*  PHOS 4.7* 3.9 4.3   Recent Labs  Lab 11/24/16 0746 11/25/16 0410 11/29/16 0328  WBC 8.0 6.6 7.1  NEUTROABS 7.0  --   --   HGB 8.0* 7.8* 8.6*  HCT 24.7* 24.1* 26.1*  MCV 90.5 91.3 89.4  PLT 114* 119* 144*   Results for Hibberd, AHRIANNA SIGLIN (MRN [id number]) as of 11/28/2016 15:57  11/24/2016 16:06  PTH, Intact 118 (H)   Background 77F with CKD4, dCHF exacerbation, OSA/COPD, anemia. Pre admission baseline creatinine 2.2-2.6.  Assessment 1. CKD4, some AoCKD, nonproteinuric, small kidneys consistent with medical-renal disease;  1. Creatinine falling with diuresis, still not at prev baseline.  2. Marked improvement in volume status by exam (I saw her initially, SO much better).  3. Good response to 120 IV lasix TID  - this would probably equate to 240 of po lasix tid - might get more reliable response from torsemide (absorption wise).  4. Recommended transition to po torsemide.  5. Starting with 50  mg BID today and see if adequate diuretic response, increase or decrease from there.  2. dCHF exacerbation (preserved EF) - improved 3. COPD with exacerbation - improved 4. Cough - new, non-productive and associated w/some SOB. CXR 5. Chronic LEE with venous stasis  6. HTN, stable; off ARB (should not resume) 7. DM2 8. OSA 9. GERD 10. Anemia - Cont Aranesp 43mcg qwk given 11/16 (iron fine, tsat 47). Transition to 120 Q2weeks at [facility] when discharged.  11. Secondary HPT - Started low dose calcitriol 0.25 TIW  12. Metabolic acidosis - oral sodium bicarb which I agree we can stop 13. Disposition -  probable SNF 14. She will have a follow up with me at [place] on Dec 12 to arrive at 12:15  Jamal Maes, MD [facility] [phone] Pager 11/30/2016, 12:44 PM

## 2016-12-01 LAB — RENAL FUNCTION PANEL
Albumin: 2.7 g/dL — ABNORMAL LOW (ref 3.5–5.0)
Anion gap: 9 (ref 5–15)
BUN: 108 mg/dL — ABNORMAL HIGH (ref 6–20)
CHLORIDE: 100 mmol/L — AB (ref 101–111)
CO2: 32 mmol/L (ref 22–32)
CREATININE: 2.75 mg/dL — AB (ref 0.44–1.00)
Calcium: 8.1 mg/dL — ABNORMAL LOW (ref 8.9–10.3)
GFR calc Af Amer: 18 mL/min — ABNORMAL LOW (ref >60)
GFR calc non Af Amer: 16 mL/min — ABNORMAL LOW (ref >60)
GLUCOSE: 149 mg/dL — AB (ref 65–99)
Phosphorus: 4.5 mg/dL (ref 2.5–4.6)
Potassium: 4 mmol/L (ref 3.5–5.1)
SODIUM: 141 mmol/L (ref 135–145)

## 2016-12-01 LAB — GLUCOSE, CAPILLARY
GLUCOSE-CAPILLARY: 108 mg/dL — AB (ref 65–99)
Glucose-Capillary: 75 mg/dL (ref 65–99)
Glucose-Capillary: 220 mg/dL — ABNORMAL HIGH (ref 65–99)
Glucose-Capillary: 181 mg/dL — ABNORMAL HIGH (ref 65–99)

## 2016-12-01 NOTE — Progress Notes (Signed)
[facility] Rounding Note  Subjective:  Transition to po torsemide 1st day of po Good UOP yest while still on IV lasix Weight 232->211 Wearing O2 since last night and says has developed a cough  Filed Weights   11/29/16 0546 11/30/16 0500 12/01/16 0413  Weight: 99.6 kg (219 lb 9.6 oz) 95.8 kg (211 lb 3.2 oz) 94.1 kg (207 lb 7.3 oz)   Physical Exam:   BP (!) 151/61 (BP Location: Right Arm)   Pulse 63   Temp 97.8 F (36.6 C) (Oral)   Resp 16   Ht 5\' 3"  (1.6 m)   Wt 94.1 kg (207 lb 7.3 oz)   SpO2 100%   BMI 36.75 kg/m  Obese AAF, very pleasant S1S2 No S3 Lungs clear anteriorly, no definite crackles Severe chronic venous stasis changes in LEs below knees but with a lot of skin wrinkling from fluid loss Pitting thigh edema MARKEDLY better and only trace now   Scheduled Meds: . amLODipine  10 mg Oral Daily  . calcitRIOL  0.25 mcg Oral Q M,W,F  . darbepoetin (ARANESP) injection - NON-DIALYSIS  60 mcg Subcutaneous Q Fri-1800  . fluticasone  2 spray Each Nare Daily  . folic acid  1 mg Oral Daily  . heparin  5,000 Units Subcutaneous Q8H  . hydrALAZINE  10 mg Oral Q8H  . insulin aspart  0-9 Units Subcutaneous TID WC  . insulin glargine  30 Units Subcutaneous Daily  . iron polysaccharides  150 mg Oral BID  . LORazepam  0.5 mg Oral q morning - 10a  . magnesium oxide  400 mg Oral Daily  . montelukast  10 mg Oral QHS  . omega-3 acid ethyl esters  1 g Oral Daily  . pantoprazole  40 mg Oral Daily  . polyethylene glycol  17 g Oral Daily  . polyvinyl alcohol  1 drop Both Eyes BID  . pyridOXINE  100 mg Oral Daily  . senna-docusate  1 tablet Oral QHS  . torsemide  50 mg Oral BID    PRN Meds:.acetaminophen **OR** acetaminophen, bisacodyl, guaiFENesin, hydrALAZINE, HYDROcodone-acetaminophen, ipratropium-albuterol, LORazepam, methocarbamol, ondansetron **OR** ondansetron (ZOFRAN) IV, prochlorperazine   Recent Labs  Lab 11/29/16 0328 11/30/16 0546 12/01/16 0156  NA 142 141 141  K 4.0  4.4 4.0  CL 103 99* 100*  CO2 30 31 32  GLUCOSE 118* 131* 149*  BUN 126* 118* 108*  CREATININE 3.13* 2.95* 2.75*  CALCIUM 7.8* 8.1* 8.1*  PHOS 3.9 4.3 4.5   Recent Labs  Lab 11/25/16 0410 11/29/16 0328  WBC 6.6 7.1  HGB 7.8* 8.6*  HCT 24.1* 26.1*  MCV 91.3 89.4  PLT 119* 144*   Results for [name], [name] (MRN [id number]) as of 11/28/2016 15:57  11/24/2016 16:06  PTH, Intact 118 (H)   Dg Chest 2 View  Result Date: 11/30/2016 CLINICAL DATA:  Cough. EXAM: CHEST  2 VIEW COMPARISON:  11/23/2016 FINDINGS: Lateral view degraded by motion and patient positioning. Both views degraded by patient size. Midline trachea. Moderate cardiomegaly. No pleural effusion or pneumothorax. Low lung volumes with resultant pulmonary interstitial prominence. No overt congestive failure. Suboptimal evaluation of the left lung base. Otherwise, no airspace opacity identified. IMPRESSION: Cardiomegaly and low lung volumes. No definite acute findings. Decreased sensitivity and specificity exam due to technique related factors, as described above. Electronically Signed   By: Abigail Miyamoto M.D.   On: 11/30/2016 19:23   Background 12F with CKD4, dCHF exacerbation, OSA/COPD, anemia. Pre admission baseline creatinine 2.2-2.6.  Assessment  1. CKD4, some AoCKD, nonproteinuric, small kidneys consistent with medical-renal disease;  1. Creatinine has improved with diuresis, VERY close to previous baseline  2. Marked improvement in volume status by exam (I saw her initially, SO much better).  3. Good response to 120 IV lasix TID  - this would probably equated to 240 of po lasix tid - I think more reliable response from torsemide (absorption wise).  4. Transitioned to po torsemide 50 mg BID, good UOP, improved creatinine 5. Looks like this will be good dose for her 2. dCHF exacerbation (preserved EF) - improved 3. COPD with exacerbation - improved 4. Cough - new, non-productive and associated w/some SOB. CXR 5. Chronic  LEE with venous stasis  6. HTN, stable; off ARB (should not resume) 7. DM2 8. OSA 9. GERD 10. Anemia - Cont Aranesp 11mcg qwk given 11/16 (iron fine, tsat 47). Dose due 11/23. Transition to 120 Q2weeks at [facility] when discharged (I will take care of this in the office when I see her).  11. Secondary HPT - Started low dose calcitriol 0.25 TIW  12. Metabolic acidosis - oral sodium bicarb stopped 13. Disposition -  probable SNF 14. She will have a follow up with me at [place] on Dec 12 to arrive at 12:15. If she goes to SNF they need to be notified of this appointment date and time.  Final recommendations as outlined in the above note. Renal will sign off at this time, please call if questions come up that I can help with.   Jamal Maes, MD [facility] [phone] Pager 12/01/2016, 12:05 PM

## 2016-12-01 NOTE — Progress Notes (Signed)
PROGRESS NOTE  [name] GQQ:[id number] DOB: [birth year] DOA: 11/22/2016 PCP: Burnard Bunting, MD  HPI/Recap of past 24 hours:  [name] is a 76 y.o. year old female with medical history significant for type 2 diabetes, CKD stage III s/p right renal carcinoma ( s/p cryoablation 07/2009), hypertension, COPD, obstructive sleep apnea on CPAP, hyperlipidemia who presented on 11/22/2016 with worsening dyspnea and was found to have acute on chronic respiratory failure presumed secondary to combined COPD exacerbation and acute exacerbation of chronic diastolic heart failure.  No new complaints reported to me.   Assessment/Plan: Active Problems:   COPD with asthma (HCC)   Allergic rhinitis   Anemia   Osteoarthritis   OSA (obstructive sleep apnea)   Insomnia   COPD exacerbation (HCC)   Acute exacerbation of CHF (congestive heart failure) (HCC)   Acute on chronic respiratory failure with hypoxia (HCC)   Acute renal failure superimposed on stage 3 chronic kidney disease (HCC)   Metabolic acidosis with normal anion gap and bicarbonate losses  1.AKI on CKD, likely secondary to CHF exacerbation and progression of CKD, improving Hyperkalemia, resolved Non Anion Gap Metabolic Acidosis, resolved Creatinine downtrending in setting of aggressive IV diuresis (lasix 120 mg TID) with net negative 8 L during hospital course. Weights have not been monitored to see correlation in output but given continued improvement in creatinine seem to be getting fluid off.  Likely progression of CKD given no acute insult with renal u/s consistent with medical renal disease.  Nephrology consulted Oral sodium bicarb, may need to d/c or decrease as Bicarb 28 today ( mixed picture of bicarb and chronic retainer from COPD?) Transitioned to oral torsemide for further diuresis Monitor BMPs, strict intake and output, avoiding nephrotoxins. Placed order for bmp  2.Acute on chronic respiratory failure secondary,  resolved  Multifactorial (COPD, CHF exacerbation, ARF on CKD).  At home oxygen requirement at rest, but unsure of requirements when mobile. - Continue supplemental oxygen, - Continue inhaler regimen --Diuresis as mentioned above -- PT recommends SNF  3.Acute exacerbation of chronic diastolic heart failure, hypervolemic , improving TTE on admission  shows preserved EF, grade 2 diastolic function.  Chest x-ray on admission with significant interstitial edema, right-sided pleural effusion and cardiomegaly.  BNP 74 however unhelpful in setting of obesity - Torsemide as mentioned aove -Daily weights, strict intake and output, follow BMP   4.COPD exacerbation.  Resolved  Unclear etiology denies any recent sick contacts.  Reports adherence with medication regimen.  Possibly related to CHF exacerbation -PRN duo nebs, - p.o. Levaquin and Oral prednisone completed - supportive care: Mucinex, Singulair - stable  5.Hypertension, improving Initially during admission only on home amlodipine. Limited as losartan held in setting of AKI on CKD and hyperkalemia. Goal 140/90. Range in 24 hours 150-180s-60-70 Home amlodipine Holding home losartan in setting of persistent hyperkalemia and AKI Po hydralazine at low dose ( may need to increase), may see some improvement with diuresis PRN IV hydralazine for SBP greater than 170  6.Type 2 diabetes, controlled.  A1c 6.7 (11/22/2016) -Home Lantus 30 units, NovoLog correction coverage  7.GERD.  Stable -Home Protonix   8. OSA on CPAP . Stable Could be contributing to increased PA pressures seen on TTE. Patient non-adherent with BIPAP at home  9. Anemia of CKD  aranesp as directed by renal  Code Status: Full code  Family Communication: No family at bedside  Disposition Plan: Monitor I/o and volume status on oral diuresis, monitoring kidney function, awaiting SNF  once oral diuresis regimen acheived  Consultants:  Nephrology: Dr.  Joelyn Oms  Procedures:  TTE 11: EF 60-65 %, wall motion normal, grade 2 diastolic dysfunction, mild aortic stenosis, moderate-severe mitral regurgitation. Severely dilated left and right atrium. Pulmonary arteries severely increased to 72 mmHg  Antimicrobials:  Levaquin 11/22/2016-11/27/16  Cultures:  None  DVT prophylaxis: Heparin   Objective: Vitals:   11/30/16 2219 12/01/16 0413 12/01/16 0454 12/01/16 1457  BP: (!) 156/61  (!) 151/61 (!) 156/52  Pulse: 63     Resp: 20  16   Temp: (!) 97.5 F (36.4 C)  97.8 F (36.6 C)   TempSrc: Oral  Oral   SpO2: 100%  100%   Weight:  94.1 kg (207 lb 7.3 oz)    Height:        Intake/Output Summary (Last 24 hours) at 12/01/2016 1559 Last data filed at 12/01/2016 0915 Gross per 24 hour  Intake -  Output 1300 ml  Net -1300 ml   Filed Weights   11/29/16 0546 11/30/16 0500 12/01/16 0413  Weight: 99.6 kg (219 lb 9.6 oz) 95.8 kg (211 lb 3.2 oz) 94.1 kg (207 lb 7.3 oz)    Exam:   General: pt in nad, alert and awake  Cardiovascular:1+pitting edema of bilateral thighs, 3/6 SEM loudest in LUSB, no radiation   Respiratory: minimal crackles at bases, no appreciable wheezes, on supplemental oxygen  Abdomen: Soft, non-distended, mild tenderness in center of abdomen, no guarding or rebound tenderness  Skin: Chronic skin changes on bilateral lower extremities from ankle to below knee: Darkly hyperpigmented skin.    Neurologic: Alert and oriented x3   Psychiatry: Appropriate affect and mood   Data Reviewed: CBC: Recent Labs  Lab 11/25/16 0410 11/29/16 0328  WBC 6.6 7.1  HGB 7.8* 8.6*  HCT 24.1* 26.1*  MCV 91.3 89.4  PLT 119* 161*   Basic Metabolic Panel: Recent Labs  Lab 11/27/16 0216  11/27/16 1609 11/28/16 0529 11/29/16 0328 11/30/16 0546 12/01/16 0156  NA 139   < > 138 142 142 141 141  K 5.1   < > 5.9* 4.3 4.0 4.4 4.0  CL 108   < > 108 104 103 99* 100*  CO2 24   < > 19* 28 30 31  32  GLUCOSE 217*   < > 286*  223* 118* 131* 149*  BUN 130*   < > 130* 127* 126* 118* 108*  CREATININE 3.43*   < > 3.40* 3.26* 3.13* 2.95* 2.75*  CALCIUM 7.5*   < > 7.9* 7.9* 7.8* 8.1* 8.1*  PHOS 5.5*  --   --  4.7* 3.9 4.3 4.5   < > = values in this interval not displayed.   GFR: Estimated Creatinine Clearance: 19.3 mL/min (A) (by C-G formula based on SCr of 2.75 mg/dL (H)). Liver Function Tests: Recent Labs  Lab 11/27/16 0216 11/28/16 0529 11/29/16 0328 11/30/16 0546 12/01/16 0156  ALBUMIN 2.7* 3.0* 2.8* 2.9* 2.7*   No results for input(s): LIPASE, AMYLASE in the last 168 hours. No results for input(s): AMMONIA in the last 168 hours. Coagulation Profile: No results for input(s): INR, PROTIME in the last 168 hours. Cardiac Enzymes: No results for input(s): CKTOTAL, CKMB, CKMBINDEX, TROPONINI in the last 168 hours. BNP (last 3 results) No results for input(s): PROBNP in the last 8760 hours. HbA1C: No results for input(s): HGBA1C in the last 72 hours. CBG: Recent Labs  Lab 11/30/16 1151 11/30/16 1651 11/30/16 2131 12/01/16 0827 12/01/16 1201  GLUCAP 138* 228*  227* 75 108*   Lipid Profile: No results for input(s): CHOL, HDL, LDLCALC, TRIG, CHOLHDL, LDLDIRECT in the last 72 hours. Thyroid Function Tests: No results for input(s): TSH, T4TOTAL, FREET4, T3FREE, THYROIDAB in the last 72 hours. Anemia Panel: No results for input(s): VITAMINB12, FOLATE, FERRITIN, TIBC, IRON, RETICCTPCT in the last 72 hours. Urine analysis:    Component Value Date/Time   COLORURINE STRAW (A) 11/22/2016 1643   APPEARANCEUR CLEAR 11/22/2016 1643   LABSPEC 1.008 11/22/2016 1643   PHURINE 5.0 11/22/2016 1643   GLUCOSEU NEGATIVE 11/22/2016 1643   HGBUR NEGATIVE 11/22/2016 1643   BILIRUBINUR NEGATIVE 11/22/2016 1643   KETONESUR NEGATIVE 11/22/2016 1643   PROTEINUR NEGATIVE 11/22/2016 1643   UROBILINOGEN 0.2 12/16/2011 1717   NITRITE NEGATIVE 11/22/2016 1643   LEUKOCYTESUR TRACE (A) 11/22/2016 1643   Sepsis  Labs: @LABRCNTIP (procalcitonin:4,lacticidven:4)  ) Recent Results (from the past 240 hour(s))  Respiratory Panel by PCR     Status: None   Collection Time: 11/23/16  2:10 PM  Result Value Ref Range Status   Adenovirus NOT DETECTED NOT DETECTED Final   Coronavirus 229E NOT DETECTED NOT DETECTED Final   Coronavirus HKU1 NOT DETECTED NOT DETECTED Final   Coronavirus NL63 NOT DETECTED NOT DETECTED Final   Coronavirus OC43 NOT DETECTED NOT DETECTED Final   Metapneumovirus NOT DETECTED NOT DETECTED Final   Rhinovirus / Enterovirus NOT DETECTED NOT DETECTED Final   Influenza A NOT DETECTED NOT DETECTED Final   Influenza B NOT DETECTED NOT DETECTED Final   Parainfluenza Virus 1 NOT DETECTED NOT DETECTED Final   Parainfluenza Virus 2 NOT DETECTED NOT DETECTED Final   Parainfluenza Virus 3 NOT DETECTED NOT DETECTED Final   Parainfluenza Virus 4 NOT DETECTED NOT DETECTED Final   Respiratory Syncytial Virus NOT DETECTED NOT DETECTED Final   Bordetella pertussis NOT DETECTED NOT DETECTED Final   Chlamydophila pneumoniae NOT DETECTED NOT DETECTED Final   Mycoplasma pneumoniae NOT DETECTED NOT DETECTED Final      Studies: Dg Chest 2 View  Result Date: 11/30/2016 CLINICAL DATA:  Cough. EXAM: CHEST  2 VIEW COMPARISON:  11/23/2016 FINDINGS: Lateral view degraded by motion and patient positioning. Both views degraded by patient size. Midline trachea. Moderate cardiomegaly. No pleural effusion or pneumothorax. Low lung volumes with resultant pulmonary interstitial prominence. No overt congestive failure. Suboptimal evaluation of the left lung base. Otherwise, no airspace opacity identified. IMPRESSION: Cardiomegaly and low lung volumes. No definite acute findings. Decreased sensitivity and specificity exam due to technique related factors, as described above. Electronically Signed   By: Abigail Miyamoto M.D.   On: 11/30/2016 19:23    Scheduled Meds: . amLODipine  10 mg Oral Daily  . calcitRIOL  0.25 mcg  Oral Q M,W,F  . darbepoetin (ARANESP) injection - NON-DIALYSIS  60 mcg Subcutaneous Q Fri-1800  . fluticasone  2 spray Each Nare Daily  . folic acid  1 mg Oral Daily  . heparin  5,000 Units Subcutaneous Q8H  . hydrALAZINE  10 mg Oral Q8H  . insulin aspart  0-9 Units Subcutaneous TID WC  . insulin glargine  30 Units Subcutaneous Daily  . iron polysaccharides  150 mg Oral BID  . LORazepam  0.5 mg Oral q morning - 10a  . magnesium oxide  400 mg Oral Daily  . montelukast  10 mg Oral QHS  . omega-3 acid ethyl esters  1 g Oral Daily  . pantoprazole  40 mg Oral Daily  . polyethylene glycol  17 g Oral Daily  .  polyvinyl alcohol  1 drop Both Eyes BID  . pyridOXINE  100 mg Oral Daily  . senna-docusate  1 tablet Oral QHS  . torsemide  50 mg Oral BID    Continuous Infusions:    LOS: 7 days     Velvet Bathe, MD [facility] Pager [phone]  If 7PM-7AM, please contact night-coverage [email or web address] Password [facility] 12/01/2016, 3:59 PM

## 2016-12-02 LAB — BASIC METABOLIC PANEL
Anion gap: 8 (ref 5–15)
BUN: 103 mg/dL — ABNORMAL HIGH (ref 6–20)
CALCIUM: 8.1 mg/dL — AB (ref 8.9–10.3)
CHLORIDE: 99 mmol/L — AB (ref 101–111)
CO2: 33 mmol/L — AB (ref 22–32)
CREATININE: 3.11 mg/dL — AB (ref 0.44–1.00)
GFR calc non Af Amer: 14 mL/min — ABNORMAL LOW (ref >60)
GFR, EST AFRICAN AMERICAN: 16 mL/min — AB (ref >60)
GLUCOSE: 183 mg/dL — AB (ref 65–99)
Potassium: 4.3 mmol/L (ref 3.5–5.1)
Sodium: 140 mmol/L (ref 135–145)

## 2016-12-02 LAB — RENAL FUNCTION PANEL
ALBUMIN: 2.9 g/dL — AB (ref 3.5–5.0)
ANION GAP: 10 (ref 5–15)
BUN: 101 mg/dL — ABNORMAL HIGH (ref 6–20)
CHLORIDE: 100 mmol/L — AB (ref 101–111)
CO2: 32 mmol/L (ref 22–32)
Calcium: 8.4 mg/dL — ABNORMAL LOW (ref 8.9–10.3)
Creatinine, Ser: 2.96 mg/dL — ABNORMAL HIGH (ref 0.44–1.00)
GFR calc Af Amer: 17 mL/min — ABNORMAL LOW (ref >60)
GFR, EST NON AFRICAN AMERICAN: 14 mL/min — AB (ref >60)
Glucose, Bld: 91 mg/dL (ref 65–99)
PHOSPHORUS: 4.8 mg/dL — AB (ref 2.5–4.6)
POTASSIUM: 4.1 mmol/L (ref 3.5–5.1)
Sodium: 142 mmol/L (ref 135–145)

## 2016-12-02 LAB — GLUCOSE, CAPILLARY
GLUCOSE-CAPILLARY: 137 mg/dL — AB (ref 65–99)
Glucose-Capillary: 158 mg/dL — ABNORMAL HIGH (ref 65–99)

## 2016-12-02 MED ORDER — CALCITRIOL 0.25 MCG PO CAPS: 0.2500 ug | ORAL_CAPSULE | ORAL | 0 refills | Status: DC

## 2016-12-02 MED ORDER — LORAZEPAM 1 MG PO TABS: 0.5000 mg | ORAL_TABLET | ORAL | 0 refills | Status: DC

## 2016-12-02 MED ORDER — TORSEMIDE 10 MG PO TABS: 50.0000 mg | ORAL_TABLET | Freq: Two times a day (BID) | ORAL | 0 refills | Status: DC

## 2016-12-02 NOTE — Progress Notes (Signed)
Report called to [facility]. Joslyn Hy, MSN, RN, [employer]

## 2016-12-02 NOTE — Progress Notes (Signed)
NURSING PROGRESS NOTE  [name] [facility] [id number] Discharge Data: 12/02/2016 5:38 PM Attending Provider: Velvet Bathe, MD DGN:[id number], Delfino Lovett, MD   [place] to be D/C'd Nursing Home per MD order.    All IV's will be discontinued and monitored for bleeding.  All belongings will be returned to patient for patient to take home.  Last Documented Vital Signs:  Blood pressure (!) 146/56, pulse 77, temperature 98.2 F (36.8 C), temperature source Oral, resp. rate 18, height 5\' 3"  (1.6 m), weight 96 kg (211 lb 10.3 oz), SpO2 95 %.  Joslyn Hy, MSN, RN, [employer]

## 2016-12-02 NOTE — Discharge Summary (Signed)
Physician Discharge Summary  [name] [facility] UXL:[id number] DOB: 01/03/1941 DOA: 11/22/2016  PCP: Burnard Bunting, MD  Admit date: 11/22/2016 Discharge date: 12/02/2016  Time spent: > 35 minutes  Recommendations for Outpatient Follow-up:  Patient transitioned to torsemide She will have a follow up at [place] on Dec 12 to arrive at 12:15.    Discharge Diagnoses:  Active Problems:   COPD with asthma ([place])   Allergic rhinitis   Anemia   Osteoarthritis   OSA (obstructive sleep apnea)   Insomnia   COPD exacerbation (HCC)   Acute exacerbation of CHF (congestive heart failure) (HCC)   Acute on chronic respiratory failure with hypoxia (HCC)   Acute renal failure superimposed on stage 3 chronic kidney disease (HCC)   Metabolic acidosis with normal anion gap and bicarbonate losses   Discharge Condition: stable  Diet recommendation: Renal/carb modified  Filed Weights   11/30/16 0500 12/01/16 0413 12/02/16 0500  Weight: 95.8 kg (211 lb 3.2 oz) 94.1 kg (207 lb 7.3 oz) 96 kg (211 lb 10.3 oz)    History of present illness:  76 y.o. year old female with medical history significant for type 2 diabetes, CKD stage III s/p right renal carcinoma ( s/p cryoablation 07/2009), hypertension, COPD, obstructive sleep apnea on CPAP, hyperlipidemia who presented on 11/22/2016 with worsening dyspnea and was found to have acute on chronic respiratory failure presumed secondary to combined COPD exacerbation and acute exacerbation of chronic diastolic heart failure.  Hospital Course:   1.AKI on CKD, likely secondary to CHF exacerbation and progression of CKD, improving Hyperkalemia, resolved Non Anion Gap Metabolic Acidosis, resolved Creatinine downtrending in setting of aggressive IV diuresis (lasix 120 mg TID) with net negative 8 L during hospital course. Weights have not been monitored to see correlation in output but given continued improvement in creatinine seem to be getting fluid off.   Likely progression of CKD given no acute insult with renal u/s consistent with medical renal disease.  Nephrology consulted Oral sodium bicarb, may need to d/c or decrease as Bicarb 28 today ( mixed picture of bicarb and chronic retainer from COPD?) Transitioned to oral torsemide for further diuresis.  Nephrology evaluated please refer to their last recommendations on 11/22  2.Acute on chronic respiratory failure secondary, resolved  Multifactorial (COPD, CHF exacerbation, ARF on CKD).  At home oxygen requirement at rest, but unsure of requirements when mobile. - Continue supplemental oxygen, - Continue inhaler regimen -- Diuresis as mentioned above -- PT recommends SNF will transition to SNF  3.Acute exacerbation of chronic diastolic heart failure, hypervolemic , improving TTE on admission  shows preserved EF, grade 2 diastolic function.  Chest x-ray on admission with significant interstitial edema, right-sided pleural effusion and cardiomegaly.  BNP 74 however unhelpful in setting of obesity - Torsemide as mentioned aove -Daily weights, strict intake and output, follow BMP   4.COPD exacerbation.  Resolved  Unclear etiology denies any recent sick contacts.  Reports adherence with medication regimen.  Possibly related to CHF exacerbation -PRN duo nebs, - p.o. Levaquin and Oral prednisone completed - supportive care: Mucinex, Singulair - stable  5.Hypertension, improving Initially during admission only on home amlodipine.Held losartan in setting of AKI on CKD and hyperkalemia. Home amlodipine Holding home losartan in setting of persistent hyperkalemia and AKI Po hydralazine at low dose ( may need to increase), may see some improvement with diuresis  6.Type 2 diabetes, controlled.  A1c 6.7 (11/22/2016) -continue prior to admission medication regimen.  7.GERD.  Stable -Home Protonix  8. OSA . Stable Follow up with pcp as outpatient for further assessment and  recommendations.  9. Anemia of CKD  aranesp as directed by renal   Procedures:  None  Consultations:  Nephrology  Discharge Exam: Vitals:   12/02/16 0538 12/02/16 1448  BP: (!) 155/62 (!) 146/56  Pulse: 60 77  Resp: 18 18  Temp: 97.8 F (36.6 C) 98.2 F (36.8 C)  SpO2: 97% 95%    General: Pt in nad, alert and awake Cardiovascular: *rrr, no rubs Respiratory: no increased wob, no wheezes  Discharge Instructions   Discharge Instructions    Diet - low sodium heart healthy   Complete by:  As directed    Discharge instructions   Complete by:  As directed    Please continue to monitor closely   Increase activity slowly   Complete by:  As directed      Current Discharge Medication List    START taking these medications   Details  calcitRIOL (ROCALTROL) 0.25 MCG capsule Take 1 capsule (0.25 mcg total) by mouth every Monday, Wednesday, and Friday. Qty: 30 capsule, Refills: 0    torsemide (DEMADEX) 10 MG tablet Take 5 tablets (50 mg total) by mouth 2 (two) times daily. Qty: 300 tablet, Refills: 0      CONTINUE these medications which have NOT CHANGED   Details  amLODipine (NORVASC) 5 MG tablet Take 10 mg by mouth daily.     budesonide (PULMICORT) 0.25 MG/2ML nebulizer solution Take 2 mLs (0.25 mg total) by nebulization 2 (two) times daily. Qty: 60 mL, Refills: 12    Calcium Carbonate-Vitamin D (CALTRATE 600+D) 600-400 MG-UNIT per tablet Take 1 tablet by mouth daily.    cyclobenzaprine (FLEXERIL) 10 MG tablet Take 10 mg 3 (three) times daily as needed by mouth for muscle spasms.    fluticasone (FLONASE) 50 MCG/ACT nasal spray Place 2 sprays into both nostrils daily. Qty: 16 g, Refills: 2    folic acid (FOLVITE) 297 MCG tablet Take 400 mcg by mouth daily.    glipiZIDE (GLUCOTROL XL) 10 MG 24 hr tablet Take 10 mg 2 (two) times daily by mouth.     HUMALOG MIX 75/25 KWIKPEN (75-25) 100 UNIT/ML SUSP Inject 15 Units into the skin at bedtime.     insulin  glargine (LANTUS) 100 UNIT/ML injection Inject 30 Units into the skin daily.     ipratropium-albuterol (DUONEB) 0.5-2.5 (3) MG/3ML SOLN Take 3 mLs by nebulization every 6 (six) hours as needed. Qty: 360 mL, Refills: 11    iron polysaccharides (NIFEREX) 150 MG capsule Take 150 mg by mouth 2 (two) times daily.    LORazepam (ATIVAN) 1 MG tablet Take 0.5-1 mg See admin instructions by mouth. Take 0.5 tablet every morning and 1 tablet in the evening as needed for sleep    magnesium oxide (MAG-OX) 400 MG tablet Take 400 mg by mouth daily.    methocarbamol (ROBAXIN) 500 MG tablet Take 500 mg 3 (three) times daily as needed by mouth for muscle spasms.  Refills: 0    montelukast (SINGULAIR) 10 MG tablet Take 1 tablet (10 mg total) by mouth at bedtime. Qty: 30 tablet, Refills: 5    NEXIUM 40 MG capsule Take 1 capsule in morning by mouth    Omega-3 Fatty Acids (FISH OIL) 1000 MG CAPS Take 1 capsule by mouth daily.    potassium chloride SA (K-DUR,KLOR-CON) 20 MEQ tablet Take 20 mEq by mouth daily.    prochlorperazine (COMPAZINE) 5 MG tablet Take 5  mg by mouth every 6 (six) hours as needed for nausea.    Propylene Glycol (SYSTANE BALANCE) 0.6 % SOLN Apply 1 drop to eye 2 (two) times daily.    pyridOXINE (VITAMIN B-6) 100 MG tablet Take 100 mg by mouth daily.    traMADol (ULTRAM) 50 MG tablet Take 50 mg by mouth every 6 (six) hours as needed for severe pain.    traZODone (DESYREL) 50 MG tablet Take 50 mg at bedtime as needed by mouth for sleep.    vitamin B-12 (CYANOCOBALAMIN) 1000 MCG tablet Take 1,000 mcg by mouth daily.    Vitamin D, Ergocalciferol, (DRISDOL) 50000 UNITS CAPS capsule Take 50,000 Units by mouth 2 (two) times a week. Refills: 7    vitamin E 400 UNIT capsule Take 400 Units by mouth daily.    ZOCOR 20 MG tablet Take 20 mg by mouth daily. Refills: 5    augmented betamethasone dipropionate (DIPROLENE-AF) 0.05 % cream Apply 1 application topically 2 (two) times daily. To  both legs      STOP taking these medications     LASIX 40 MG tablet      losartan (COZAAR) 100 MG tablet        Allergies  Allergen Reactions  . Other Other (See Comments)    "all generics make her sick"   Contact information for after-discharge care    [facility] .   Service:  Skilled Nursing Contact information: [address] [place] [phone]               The results of significant diagnostics from this hospitalization (including imaging, microbiology, ancillary and laboratory) are listed below for reference.    Significant Diagnostic Studies: Dg Chest 2 View  Result Date: 11/30/2016 CLINICAL DATA:  Cough. EXAM: CHEST  2 VIEW COMPARISON:  11/23/2016 FINDINGS: Lateral view degraded by motion and patient positioning. Both views degraded by patient size. Midline trachea. Moderate cardiomegaly. No pleural effusion or pneumothorax. Low lung volumes with resultant pulmonary interstitial prominence. No overt congestive failure. Suboptimal evaluation of the left lung base. Otherwise, no airspace opacity identified. IMPRESSION: Cardiomegaly and low lung volumes. No definite acute findings. Decreased sensitivity and specificity exam due to technique related factors, as described above. Electronically Signed   By: Abigail Miyamoto M.D.   On: 11/30/2016 19:23   Dg Chest 2 View  Result Date: 11/23/2016 CLINICAL DATA:  Shortness of breath.  COPD. EXAM: CHEST  2 VIEW COMPARISON:  11/22/2016 FINDINGS: There is moderate cardiac enlargement. Aortic atherosclerosis noted. Pulmonary edema pattern has improved from the previous exam. Small pleural effusions are unchanged. IMPRESSION: 1. Improvement in pulmonary edema pattern. Electronically Signed   By: Kerby Moors M.D.   On: 11/23/2016 09:34   Dg Chest 2 View  Result Date: 11/22/2016 CLINICAL DATA:  Acute onset of generalized chest tightness and shortness of breath. EXAM:  CHEST  2 VIEW COMPARISON:  Chest radiograph performed 05/19/2015 FINDINGS: The lungs are well-aerated. Vascular congestion is noted. Increased interstitial markings raise concern for pulmonary edema. There is no evidence of pleural effusion or pneumothorax. The heart is mildly enlarged. No acute osseous abnormalities are seen. Degenerative change is noted at the left glenohumeral joint, with osteophyte formation. IMPRESSION: Vascular congestion and mild cardiomegaly. Increased interstitial markings raise concern for pulmonary edema. Electronically Signed   By: Garald Balding M.D.   On: 11/22/2016 04:32   US Renal  Result Date: 11/24/2016 CLINICAL DATA:  Acute  renal failure superimposed on stage 3 chronic kidney disease. EXAM: RENAL / URINARY TRACT ULTRASOUND COMPLETE COMPARISON:  CT renal protocol dated October 30, 2013. FINDINGS: Right Kidney: Length: 8.8 cm. Increased echogenicity. No mass or hydronephrosis visualized. Two small exophytic simple cysts measuring up to 0.9 cm are noted. Left Kidney: Length: 8.6 cm. Increased echogenicity. No mass or hydronephrosis visualized. A 1.3 cm echogenic lesion within the lower pole of the left kidney likely corresponds to a proteinaceous or calcified cyst, stable since 2014. Bladder: Appears normal for degree of bladder distention. IMPRESSION: 1. Increased renal echogenicity bilaterally, consistent with medical renal disease. No hydronephrosis. Electronically Signed   By: Titus Dubin M.D.   On: 11/24/2016 13:09    Microbiology: Recent Results (from the past 240 hour(s))  Respiratory Panel by PCR     Status: None   Collection Time: 11/23/16  2:10 PM  Result Value Ref Range Status   Adenovirus NOT DETECTED NOT DETECTED Final   Coronavirus 229E NOT DETECTED NOT DETECTED Final   Coronavirus HKU1 NOT DETECTED NOT DETECTED Final   Coronavirus NL63 NOT DETECTED NOT DETECTED Final   Coronavirus OC43 NOT DETECTED NOT DETECTED Final   Metapneumovirus NOT DETECTED  NOT DETECTED Final   Rhinovirus / Enterovirus NOT DETECTED NOT DETECTED Final   Influenza A NOT DETECTED NOT DETECTED Final   Influenza B NOT DETECTED NOT DETECTED Final   Parainfluenza Virus 1 NOT DETECTED NOT DETECTED Final   Parainfluenza Virus 2 NOT DETECTED NOT DETECTED Final   Parainfluenza Virus 3 NOT DETECTED NOT DETECTED Final   Parainfluenza Virus 4 NOT DETECTED NOT DETECTED Final   Respiratory Syncytial Virus NOT DETECTED NOT DETECTED Final   Bordetella pertussis NOT DETECTED NOT DETECTED Final   Chlamydophila pneumoniae NOT DETECTED NOT DETECTED Final   Mycoplasma pneumoniae NOT DETECTED NOT DETECTED Final     Labs: Basic Metabolic Panel: Recent Labs  Lab 11/28/16 0529 11/29/16 0328 11/30/16 0546 12/01/16 0156 12/02/16 0001 12/02/16 0445  NA 142 142 141 141 140 142  K 4.3 4.0 4.4 4.0 4.3 4.1  CL 104 103 99* 100* 99* 100*  CO2 28 30 31  32 33* 32  GLUCOSE 223* 118* 131* 149* 183* 91  BUN 127* 126* 118* 108* 103* 101*  CREATININE 3.26* 3.13* 2.95* 2.75* 3.11* 2.96*  CALCIUM 7.9* 7.8* 8.1* 8.1* 8.1* 8.4*  PHOS 4.7* 3.9 4.3 4.5  --  4.8*   Liver Function Tests: Recent Labs  Lab 11/28/16 0529 11/29/16 0328 11/30/16 0546 12/01/16 0156 12/02/16 0445  ALBUMIN 3.0* 2.8* 2.9* 2.7* 2.9*   No results for input(s): LIPASE, AMYLASE in the last 168 hours. No results for input(s): AMMONIA in the last 168 hours. CBC: Recent Labs  Lab 11/29/16 0328  WBC 7.1  HGB 8.6*  HCT 26.1*  MCV 89.4  PLT 144*   Cardiac Enzymes: No results for input(s): CKTOTAL, CKMB, CKMBINDEX, TROPONINI in the last 168 hours. BNP: BNP (last 3 results) Recent Labs    11/22/16 0405  BNP 74.2    ProBNP (last 3 results) No results for input(s): PROBNP in the last 8760 hours.  CBG: Recent Labs  Lab 12/01/16 0827 12/01/16 1201 12/01/16 1654 12/01/16 2305 12/02/16 1150  GLUCAP 75 108* 220* 181* 137*    Signed:  Velvet Bathe MD.  [facility] 12/02/2016, 4:13  PM

## 2016-12-02 NOTE — Progress Notes (Signed)
Patient will DC to: [facility] Anticipated DC date: 12/02/16 Family notified: Patient notifying friends Transport by: [facility] 5pm  Please send signed script for Ativan.   Per MD patient ready for DC to [facility]. RN, patient, patient's family, and facility notified of DC. Discharge Summary sent to facility. RN given number for report [phone]). DC packet on chart. Ambulance transport requested for patient.   CSW signing off.  Cedric Fishman, [place] Social Worker [phone]

## 2016-12-02 NOTE — Clinical Social Work Placement (Signed)
   CLINICAL SOCIAL WORK PLACEMENT  NOTE  Date:  12/02/2016  Patient Details  Name: QUANESHA KLIMASZEWSKI MRN: [id number] Date of Birth: [birth year]  Clinical Social Work is seeking post-discharge placement for this patient at the [place] level of care (*CSW will initial, date and re-position this form in  chart as items are completed):  Yes   Patient/family provided with [place] Work Department's list of facilities offering this level of care within the geographic area requested by the patient (or if unable, by the patient's family).  Yes   Patient/family informed of their freedom to choose among providers that offer the needed level of care, that participate in Medicare, Medicaid or managed care program needed by the patient, have an available bed and are willing to accept the patient.  Yes   Patient/family informed of [place] [facility]'s ownership interest in [facility] and [facility], as well as of the fact that they are under no obligation to receive care at these facilities.  PASRR submitted to EDS on       PASRR number received on       Existing PASRR number confirmed on 11/28/16     FL2 transmitted to all facilities in geographic area requested by pt/family on 11/28/16     FL2 transmitted to all facilities within larger geographic area on       Patient informed that his/her managed care company has contracts with or will negotiate with certain facilities, including the following:        Yes   Patient/family informed of bed offers received.  Patient chooses bed at [facility]     Physician recommends and patient chooses bed at      Patient to be transferred to [facility] on 12/02/16.  Patient to be transferred to facility by [facility]     Patient family notified on 12/02/16 of transfer.  Name of family member notified:  Patient alerting friends     PHYSICIAN Please sign FL2     Additional Comment:     _______________________________________________ Benard Halsted, [place] 12/02/2016, 4:18 PM

## 2016-12-09 ENCOUNTER — Ambulatory Visit (INDEPENDENT_AMBULATORY_CARE_PROVIDER_SITE_OTHER): Payer: Medicare Other | Admitting: Family

## 2017-01-26 NOTE — Discharge Instructions (Signed)

## 2017-01-27 ENCOUNTER — Telehealth: Payer: Self-pay | Admitting: Pulmonary Disease

## 2017-01-27 ENCOUNTER — Ambulatory Visit (HOSPITAL_COMMUNITY)
Admission: RE | Admit: 2017-01-27 | Discharge: 2017-01-27 | Disposition: A | Discharge status: Home / Self Care | Payer: Medicare Other | Source: Ambulatory Visit | Attending: Nephrology | Admitting: Nephrology

## 2017-01-27 VITALS — BP 140/53 | HR 73 | Temp 97.6°F | Resp 20

## 2017-01-27 DIAGNOSIS — D631 Anemia in chronic kidney disease: Secondary | ICD-10-CM | POA: Diagnosis present

## 2017-01-27 DIAGNOSIS — N184 Chronic kidney disease, stage 4 (severe): Secondary | ICD-10-CM | POA: Insufficient documentation

## 2017-01-27 LAB — POCT HEMOGLOBIN-HEMACUE: Hemoglobin: 8.9 g/dL — ABNORMAL LOW (ref 12.0–15.0)

## 2017-01-27 MED ORDER — DARBEPOETIN ALFA 60 MCG/0.3ML IJ SOSY
60.0000 ug | PREFILLED_SYRINGE | INTRAMUSCULAR | Status: DC
  Administered 2017-01-27: 11:00:00 60 ug via SUBCUTANEOUS

## 2017-01-27 MED ORDER — DARBEPOETIN ALFA 60 MCG/0.3ML IJ SOSY
PREFILLED_SYRINGE | INTRAMUSCULAR | Status: AC
  Filled 2017-01-27: qty 0.3

## 2017-01-27 NOTE — Telephone Encounter (Signed)
lmtcb x1 for pt. 

## 2017-01-30 NOTE — Telephone Encounter (Signed)
Left message for patient to call back  

## 2017-01-31 NOTE — Telephone Encounter (Signed)
I called [facility] & spoke to Tiffany.  She states Larkin Ina the respiratory therapist evaluated pt on 10/26 & he stated in notes that pt did not do well while walking, breathing heavy & not a good candidate for poc.

## 2017-01-31 NOTE — Telephone Encounter (Addendum)
Spoke with pt, and advised message from [place]. She states she is unable to take those big tanks around when she has to leave the house. I explained to her that she did not qualify for POC. I called [facility] and they stated she has the smaller e-tank already and that there was nothing else they can do. She advised me to tell pt to call them if she needed someone to bring her the smaller tanks. I called pt to let her know and she states she will call them to see if she can receive the smaller tanks. Nothing further is needed.

## 2017-01-31 NOTE — Telephone Encounter (Signed)
lmtcb X3 for pt. Pt has not been seen since 09/2016.   Order to assess for POC was placed on 10/05/2016 to [facility]. PCCs please advise on order.  Thanks.

## 2017-02-24 ENCOUNTER — Encounter (HOSPITAL_COMMUNITY)
Admission: RE | Admit: 2017-02-24 | Discharge: 2017-02-24 | Disposition: A | Discharge status: Home / Self Care | Payer: Medicare Other | Source: Ambulatory Visit | Attending: Nephrology | Admitting: Nephrology

## 2017-02-24 VITALS — BP 154/59 | HR 69 | Temp 98.6°F | Resp 20

## 2017-02-24 DIAGNOSIS — N184 Chronic kidney disease, stage 4 (severe): Secondary | ICD-10-CM | POA: Diagnosis not present

## 2017-02-24 DIAGNOSIS — D631 Anemia in chronic kidney disease: Secondary | ICD-10-CM | POA: Diagnosis present

## 2017-02-24 LAB — IRON AND TIBC
IRON: 73 ug/dL (ref 28–170)
SATURATION RATIOS: 23 % (ref 10.4–31.8)
TIBC: 322 ug/dL (ref 250–450)
UIBC: 249 ug/dL

## 2017-02-24 LAB — POCT HEMOGLOBIN-HEMACUE: Hemoglobin: 10.2 g/dL — ABNORMAL LOW (ref 12.0–15.0)

## 2017-02-24 LAB — FERRITIN: Ferritin: 105 ng/mL (ref 11–307)

## 2017-02-24 MED ORDER — DARBEPOETIN ALFA 60 MCG/0.3ML IJ SOSY: 60.0000 ug | PREFILLED_SYRINGE | INTRAMUSCULAR | Status: DC

## 2017-02-24 MED ORDER — DARBEPOETIN ALFA 60 MCG/0.3ML IJ SOSY
PREFILLED_SYRINGE | INTRAMUSCULAR | Status: AC
  Administered 2017-02-24: 11:00:00 60 ug
  Filled 2017-02-24: qty 0.3

## 2017-03-22 ENCOUNTER — Ambulatory Visit: Payer: Medicare Other | Admitting: Podiatry

## 2017-03-22 DIAGNOSIS — M79675 Pain in left toe(s): Secondary | ICD-10-CM

## 2017-03-22 DIAGNOSIS — M79674 Pain in right toe(s): Secondary | ICD-10-CM | POA: Diagnosis not present

## 2017-03-22 DIAGNOSIS — Q828 Other specified congenital malformations of skin: Secondary | ICD-10-CM

## 2017-03-22 DIAGNOSIS — B351 Tinea unguium: Secondary | ICD-10-CM

## 2017-03-22 NOTE — Progress Notes (Signed)
This patient presents the office for continued evaluation and treatment of painful callus on the bottom of her right foot.  She also presents to the office for her nails since they are thick and long and painful.  She says that she is unable to self treat.  This patient is a diabetic with angiopathy and neuropathy.  She presents the office today for preventative foot care services. Patient has not been seen in 22 months.  General Appearance  Alert, conversant and in no acute stress.  Vascular  Dorsalis pedis and posterior tibial  pulses are not  palpable  Bilaterally due to swelling  B/L  Venous stasis both legs.  Capillary return is within normal limits  bilaterally. Temperature is within normal limits  bilaterally.  Neurologic  Senn-Weinstein monofilament wire test diminished   bilaterally. Muscle power within normal limits bilaterally.  Nails Thick disfigured discolored nails with subungual debris  from hallux to fifth toes bilaterally. No evidence of bacterial infection or drainage bilaterally.  Orthopedic  No limitations of motion of motion feet .  No crepitus or effusions noted.  No bony pathology or digital deformities noted. Pes planus  B/L.   Arthritis right midfoot.  Skin  normotropic skin with no porokeratosis noted bilaterally.  No signs of infections or ulcers noted.  Callus plantar arthritis right foot.  Callus secondary plantar arthritis right foot.  Asymptomatic callus left foot.  Onychomycosis  B/L   Debride callus right foot.  Debride nails  B/L  RTC prn   Gardiner Barefoot DPM

## 2017-03-24 ENCOUNTER — Encounter (HOSPITAL_COMMUNITY)
Admission: RE | Admit: 2017-03-24 | Discharge: 2017-03-24 | Disposition: A | Discharge status: Home / Self Care | Payer: Medicare Other | Source: Ambulatory Visit | Attending: Nephrology | Admitting: Nephrology

## 2017-03-24 VITALS — BP 151/55 | HR 82 | Temp 97.7°F | Resp 20

## 2017-03-24 DIAGNOSIS — D631 Anemia in chronic kidney disease: Secondary | ICD-10-CM | POA: Diagnosis present

## 2017-03-24 DIAGNOSIS — N184 Chronic kidney disease, stage 4 (severe): Secondary | ICD-10-CM | POA: Diagnosis not present

## 2017-03-24 LAB — IRON AND TIBC
Iron: 71 ug/dL (ref 28–170)
SATURATION RATIOS: 24 % (ref 10.4–31.8)
TIBC: 301 ug/dL (ref 250–450)
UIBC: 230 ug/dL

## 2017-03-24 LAB — POCT HEMOGLOBIN-HEMACUE: Hemoglobin: 9.2 g/dL — ABNORMAL LOW (ref 12.0–15.0)

## 2017-03-24 LAB — FERRITIN: FERRITIN: 110 ng/mL (ref 11–307)

## 2017-03-24 MED ORDER — DARBEPOETIN ALFA 60 MCG/0.3ML IJ SOSY
60.0000 ug | PREFILLED_SYRINGE | INTRAMUSCULAR | Status: DC
  Administered 2017-03-24: 60 ug via SUBCUTANEOUS

## 2017-03-24 MED ORDER — DARBEPOETIN ALFA 60 MCG/0.3ML IJ SOSY
PREFILLED_SYRINGE | INTRAMUSCULAR | Status: AC
  Administered 2017-03-24: 60 ug via SUBCUTANEOUS
  Filled 2017-03-24: qty 0.3

## 2017-04-03 ENCOUNTER — Inpatient Hospital Stay (HOSPITAL_COMMUNITY)
Admission: EM | Admit: 2017-04-03 | Discharge: 2017-04-08 | DRG: 637 | Disposition: A | Discharge status: Skilled Nursing Facility | Payer: Medicare Other | Attending: Internal Medicine | Admitting: Internal Medicine

## 2017-04-03 ENCOUNTER — Other Ambulatory Visit: Payer: Self-pay

## 2017-04-03 ENCOUNTER — Emergency Department (HOSPITAL_COMMUNITY): Payer: Medicare Other

## 2017-04-03 ENCOUNTER — Encounter (HOSPITAL_COMMUNITY): Payer: Self-pay | Admitting: Emergency Medicine

## 2017-04-03 DIAGNOSIS — E162 Hypoglycemia, unspecified: Secondary | ICD-10-CM

## 2017-04-03 DIAGNOSIS — E160 Drug-induced hypoglycemia without coma: Secondary | ICD-10-CM | POA: Diagnosis present

## 2017-04-03 DIAGNOSIS — E11649 Type 2 diabetes mellitus with hypoglycemia without coma: Secondary | ICD-10-CM | POA: Diagnosis not present

## 2017-04-03 DIAGNOSIS — I5033 Acute on chronic diastolic (congestive) heart failure: Secondary | ICD-10-CM | POA: Diagnosis present

## 2017-04-03 DIAGNOSIS — I160 Hypertensive urgency: Secondary | ICD-10-CM | POA: Diagnosis present

## 2017-04-03 DIAGNOSIS — J9601 Acute respiratory failure with hypoxia: Secondary | ICD-10-CM | POA: Diagnosis not present

## 2017-04-03 DIAGNOSIS — I878 Other specified disorders of veins: Secondary | ICD-10-CM | POA: Diagnosis present

## 2017-04-03 DIAGNOSIS — Z79899 Other long term (current) drug therapy: Secondary | ICD-10-CM

## 2017-04-03 DIAGNOSIS — Z86010 Personal history of colonic polyps: Secondary | ICD-10-CM

## 2017-04-03 DIAGNOSIS — Z85528 Personal history of other malignant neoplasm of kidney: Secondary | ICD-10-CM

## 2017-04-03 DIAGNOSIS — T383X5A Adverse effect of insulin and oral hypoglycemic [antidiabetic] drugs, initial encounter: Secondary | ICD-10-CM | POA: Diagnosis present

## 2017-04-03 DIAGNOSIS — Z803 Family history of malignant neoplasm of breast: Secondary | ICD-10-CM

## 2017-04-03 DIAGNOSIS — Z87891 Personal history of nicotine dependence: Secondary | ICD-10-CM

## 2017-04-03 DIAGNOSIS — I130 Hypertensive heart and chronic kidney disease with heart failure and stage 1 through stage 4 chronic kidney disease, or unspecified chronic kidney disease: Secondary | ICD-10-CM | POA: Diagnosis present

## 2017-04-03 DIAGNOSIS — E1122 Type 2 diabetes mellitus with diabetic chronic kidney disease: Secondary | ICD-10-CM | POA: Diagnosis present

## 2017-04-03 DIAGNOSIS — L814 Other melanin hyperpigmentation: Secondary | ICD-10-CM | POA: Diagnosis present

## 2017-04-03 DIAGNOSIS — G4733 Obstructive sleep apnea (adult) (pediatric): Secondary | ICD-10-CM | POA: Diagnosis present

## 2017-04-03 DIAGNOSIS — Y92009 Unspecified place in unspecified non-institutional (private) residence as the place of occurrence of the external cause: Secondary | ICD-10-CM

## 2017-04-03 DIAGNOSIS — D696 Thrombocytopenia, unspecified: Secondary | ICD-10-CM | POA: Diagnosis present

## 2017-04-03 DIAGNOSIS — D631 Anemia in chronic kidney disease: Secondary | ICD-10-CM | POA: Diagnosis present

## 2017-04-03 DIAGNOSIS — N184 Chronic kidney disease, stage 4 (severe): Secondary | ICD-10-CM | POA: Diagnosis present

## 2017-04-03 DIAGNOSIS — F329 Major depressive disorder, single episode, unspecified: Secondary | ICD-10-CM | POA: Diagnosis present

## 2017-04-03 DIAGNOSIS — I509 Heart failure, unspecified: Secondary | ICD-10-CM

## 2017-04-03 DIAGNOSIS — Z8711 Personal history of peptic ulcer disease: Secondary | ICD-10-CM

## 2017-04-03 DIAGNOSIS — Z6839 Body mass index (BMI) 39.0-39.9, adult: Secondary | ICD-10-CM

## 2017-04-03 DIAGNOSIS — I081 Rheumatic disorders of both mitral and tricuspid valves: Secondary | ICD-10-CM | POA: Diagnosis present

## 2017-04-03 DIAGNOSIS — J449 Chronic obstructive pulmonary disease, unspecified: Secondary | ICD-10-CM

## 2017-04-03 DIAGNOSIS — R69 Illness, unspecified: Secondary | ICD-10-CM

## 2017-04-03 DIAGNOSIS — Z96653 Presence of artificial knee joint, bilateral: Secondary | ICD-10-CM | POA: Diagnosis present

## 2017-04-03 DIAGNOSIS — E7800 Pure hypercholesterolemia, unspecified: Secondary | ICD-10-CM | POA: Diagnosis present

## 2017-04-03 DIAGNOSIS — Z791 Long term (current) use of non-steroidal anti-inflammatories (NSAID): Secondary | ICD-10-CM

## 2017-04-03 DIAGNOSIS — K219 Gastro-esophageal reflux disease without esophagitis: Secondary | ICD-10-CM | POA: Diagnosis present

## 2017-04-03 DIAGNOSIS — Z794 Long term (current) use of insulin: Secondary | ICD-10-CM

## 2017-04-03 DIAGNOSIS — Z7951 Long term (current) use of inhaled steroids: Secondary | ICD-10-CM

## 2017-04-03 HISTORY — DX: Dependence on supplemental oxygen: Z99.81

## 2017-04-03 LAB — COMPREHENSIVE METABOLIC PANEL
ALK PHOS: 112 U/L (ref 38–126)
ALT: 43 U/L (ref 14–54)
ANION GAP: 7 (ref 5–15)
AST: 32 U/L (ref 15–41)
Albumin: 3 g/dL — ABNORMAL LOW (ref 3.5–5.0)
BILIRUBIN TOTAL: 0.6 mg/dL (ref 0.3–1.2)
BUN: 52 mg/dL — AB (ref 6–20)
CALCIUM: 8.7 mg/dL — AB (ref 8.9–10.3)
CO2: 20 mmol/L — ABNORMAL LOW (ref 22–32)
CREATININE: 2.09 mg/dL — AB (ref 0.44–1.00)
Chloride: 117 mmol/L — ABNORMAL HIGH (ref 101–111)
GFR calc Af Amer: 26 mL/min — ABNORMAL LOW (ref >60)
GFR calc non Af Amer: 22 mL/min — ABNORMAL LOW (ref >60)
Glucose, Bld: 67 mg/dL (ref 65–99)
Potassium: 4.3 mmol/L (ref 3.5–5.1)
Sodium: 144 mmol/L (ref 135–145)
TOTAL PROTEIN: 7 g/dL (ref 6.5–8.1)

## 2017-04-03 LAB — CBC WITH DIFFERENTIAL/PLATELET
BASOS PCT: 0 %
Basophils Absolute: 0 10*3/uL (ref 0.0–0.1)
EOS ABS: 0.2 10*3/uL (ref 0.0–0.7)
EOS PCT: 3 %
HCT: 27.9 % — ABNORMAL LOW (ref 36.0–46.0)
Hemoglobin: 9.2 g/dL — ABNORMAL LOW (ref 12.0–15.0)
LYMPHS ABS: 1.2 10*3/uL (ref 0.7–4.0)
Lymphocytes Relative: 22 %
MCH: 29.6 pg (ref 26.0–34.0)
MCHC: 33 g/dL (ref 30.0–36.0)
MCV: 89.7 fL (ref 78.0–100.0)
MONO ABS: 0.2 10*3/uL (ref 0.1–1.0)
MONOS PCT: 4 %
NEUTROS PCT: 71 %
Neutro Abs: 3.9 10*3/uL (ref 1.7–7.7)
PLATELETS: 146 10*3/uL — AB (ref 150–400)
RBC: 3.11 MIL/uL — ABNORMAL LOW (ref 3.87–5.11)
RDW: 16.2 % — AB (ref 11.5–15.5)
WBC: 5.6 10*3/uL (ref 4.0–10.5)

## 2017-04-03 LAB — URINALYSIS, ROUTINE W REFLEX MICROSCOPIC
Bilirubin Urine: NEGATIVE
GLUCOSE, UA: NEGATIVE mg/dL
Hgb urine dipstick: NEGATIVE
KETONES UR: NEGATIVE mg/dL
Leukocytes, UA: NEGATIVE
Nitrite: NEGATIVE
PROTEIN: 30 mg/dL — AB
Specific Gravity, Urine: 1.011 (ref 1.005–1.030)
pH: 5 (ref 5.0–8.0)

## 2017-04-03 LAB — CBG MONITORING, ED
GLUCOSE-CAPILLARY: 65 mg/dL (ref 65–99)
Glucose-Capillary: 152 mg/dL — ABNORMAL HIGH (ref 65–99)
Glucose-Capillary: 119 mg/dL — ABNORMAL HIGH (ref 65–99)

## 2017-04-03 LAB — I-STAT TROPONIN, ED
TROPONIN I, POC: 0.03 ng/mL (ref 0.00–0.08)
Troponin i, poc: 0.06 ng/mL (ref 0.00–0.08)

## 2017-04-03 LAB — PROTIME-INR
INR: 1.2
PROTHROMBIN TIME: 15.1 s (ref 11.4–15.2)

## 2017-04-03 LAB — I-STAT CG4 LACTIC ACID, ED
Lactic Acid, Venous: 0.42 mmol/L — ABNORMAL LOW (ref 0.5–1.9)
Lactic Acid, Venous: 0.83 mmol/L (ref 0.5–1.9)

## 2017-04-03 LAB — LIPASE, BLOOD: Lipase: 21 U/L (ref 11–51)

## 2017-04-03 MED ORDER — AMLODIPINE BESYLATE 5 MG PO TABS
10.0000 mg | ORAL_TABLET | Freq: Once | ORAL | Status: AC
  Administered 2017-04-03: 10 mg via ORAL
  Filled 2017-04-03: qty 2

## 2017-04-03 MED ORDER — IPRATROPIUM-ALBUTEROL 0.5-2.5 (3) MG/3ML IN SOLN
3.0000 mL | Freq: Once | RESPIRATORY_TRACT | Status: AC
  Administered 2017-04-03: 3 mL via RESPIRATORY_TRACT
  Filled 2017-04-03: qty 3

## 2017-04-03 MED ORDER — SODIUM CHLORIDE 0.9 % IV SOLN
Freq: Once | INTRAVENOUS | Status: AC
  Administered 2017-04-03: 18:00:00 via INTRAVENOUS

## 2017-04-03 MED ORDER — HYDRALAZINE HCL 20 MG/ML IJ SOLN
20.0000 mg | Freq: Once | INTRAMUSCULAR | Status: AC
  Administered 2017-04-03: 20 mg via INTRAVENOUS
  Filled 2017-04-03: qty 1

## 2017-04-03 MED ORDER — NITROGLYCERIN 2 % TD OINT
1.0000 [in_us] | TOPICAL_OINTMENT | Freq: Four times a day (QID) | TRANSDERMAL | Status: DC
  Administered 2017-04-04 – 2017-04-05 (×5): 1 [in_us] via TOPICAL
  Filled 2017-04-03: qty 30
  Filled 2017-04-03 (×3): qty 1

## 2017-04-03 MED ORDER — FUROSEMIDE 10 MG/ML IJ SOLN
40.0000 mg | Freq: Once | INTRAMUSCULAR | Status: AC
  Administered 2017-04-03: 40 mg via INTRAVENOUS
  Filled 2017-04-03: qty 4

## 2017-04-03 MED ORDER — DEXTROSE 50 % IV SOLN
INTRAVENOUS | Status: AC
  Filled 2017-04-03: qty 50

## 2017-04-03 MED ORDER — METHYLPREDNISOLONE SODIUM SUCC 125 MG IJ SOLR
125.0000 mg | Freq: Once | INTRAMUSCULAR | Status: AC
  Administered 2017-04-03: 125 mg via INTRAVENOUS
  Filled 2017-04-03: qty 2

## 2017-04-03 NOTE — ED Notes (Signed)
BNP redrawn and sent to lab-Monique,RN

## 2017-04-03 NOTE — ED Provider Notes (Addendum)
[facility] EMERGENCY DEPARTMENT Provider Note   CSN: [id number] Arrival date & time: 04/03/17  1512     History   Chief Complaint Chief Complaint  Patient presents with  . Hypoglycemia    HPI [name] is a 77 y.o. female.  HPI Patient reports that she felt normal yesterday.  She reports that she got up and did her usual activities such as making her meals.  She states she does spend a lot of time in her recliner.  She is not very active.  She reports this morning she was very weak and could not get out of her recliner or use her phone to call her doctor.  She reports unfortunately, her blood sugar gets low while she is sleeping and then she cannot do anything about it.  Her caregiver checked on her today and found her to be unresponsive.  EMS arrived and CBG was 56.  Patient was given dextrose supplemental and return to normal mental status.  Patient reports that "all of her parts are moving."She denies ever experiencing any focal weakness numbness or tingling.  She reports when she got in her recliner yesterday evening she felt like everything was normal.  Patient lives alone.  Reportedly she was last seen day before yesterday.  A caregiver comes 3 days a week.  Patient was in a brief that was urine saturated and strong smelling.  Patient does not have other family members or individuals that can describe how she was doing yesterday.  The patient reports that she was up till 9 or 10:00 with a visiting friend who ate with her.  She reports that she took 70/25 insulin in the morning and her Lantus at night.  She reports she is also taking twice daily glipizide. Past Medical History:  Diagnosis Date  . Adenomatous colon polyp   . Anemia   . Arthritis   . Asthma   . COPD (chronic obstructive pulmonary disease) ([place])   . 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 Depression   . Diabetes mellitus   . Diverticulosis   . Gastric ulcer   . GERD (gastroesophageal reflux  disease)   . HTN (hypertension)   . Hypercholesterolemia   . Internal hemorrhoids   . Lumbar disc disease   . OSA (obstructive sleep apnea) 09/17/2012  . Peptic ulcer disease   . 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 Umbilical hernia   . Valgus foot    right     Patient Active Problem List   Diagnosis Date Noted  . Acute renal failure superimposed on stage 3 chronic kidney disease ([place]) 11/23/2016  . Metabolic acidosis with normal anion gap and bicarbonate losses 11/23/2016  . Acute exacerbation of CHF (congestive heart failure) ([place]) 11/22/2016  . Acute on chronic respiratory failure with hypoxia ([place]) 11/22/2016  . Midfoot ulcer, left, limited to breakdown of skin ([place]) 06/13/2016  . Idiopathic chronic venous hypertension of both lower extremities with inflammation 03/10/2016  . Onychomycosis 03/10/2016  . COPD exacerbation ([place]) 11/07/2013  . Upper airway cough syndrome 11/07/2013  . Insomnia 12/10/2012  . OSA (obstructive sleep apnea) 09/17/2012  . Benign neoplasm of colon 08/14/2012  . Rectal bleeding 08/13/2012    Class: Acute  . Anemia 08/13/2012    Class: Acute  . Osteoarthritis 08/13/2012    Class: Chronic  . Acute posthemorrhagic anemia 08/13/2012  . Diverticulosis of colon (without mention of hemorrhage) 08/13/2012  . Hoarseness 07/20/2011  . COPD  with asthma ([place]) 04/20/2011  . Allergic rhinitis 04/20/2011  . GI bleed 02/08/2011  . Personal history of colonic polyps 04/16/2010  . Fecal incontinence 03/23/2010  . Change in bowel habits 03/23/2010    Past Surgical History:  Procedure Laterality Date  . COLONOSCOPY  2209 or 2010    2001, 2005, 2009  colonoscopy.  [place]  . COLONOSCOPY N/A 08/14/2012   Procedure: COLONOSCOPY;  Surgeon: Ladene Artist, MD;  Location: [facility] ENDOSCOPY;  Service: Endoscopy;  Laterality: N/A;  . INCISE AND DRAIN ABCESS     Abdominal Wall  . LIPOMA RESECTION     Right Neck  . TOTAL KNEE ARTHROPLASTY     Bilateral  .  UMBILICAL HERNIA REPAIR  2007   incarcerated  . UPPER GASTROINTESTINAL ENDOSCOPY  2001   medoff Grade A esophagitis     OB History   None      Home Medications    Prior to Admission medications   Medication Sig Start Date End Date Taking? Authorizing Provider  amLODipine (NORVASC) 5 MG tablet Take 10 mg by mouth daily.    Yes [provider]  augmented betamethasone dipropionate (DIPROLENE-AF) 0.05 % cream Apply 1 application topically 2 (two) times daily. To both legs   Yes [provider]  budesonide (PULMICORT) 0.25 MG/2ML nebulizer solution Take 2 mLs (0.25 mg total) by nebulization 2 (two) times daily. 10/05/16  Yes Chesley Mires, MD  calcitRIOL (ROCALTROL) 0.25 MCG capsule Take 1 capsule (0.25 mcg total) by mouth every Monday, Wednesday, and Friday. 12/05/16  Yes Velvet Bathe, MD  Calcium Carbonate-Vitamin D (CALTRATE 600+D) 600-400 MG-UNIT per tablet Take 1 tablet by mouth daily.   Yes [provider]  cyclobenzaprine (FLEXERIL) 10 MG tablet Take 10 mg 3 (three) times daily as needed by mouth for muscle spasms.   Yes [provider]  fluticasone (FLONASE) 50 MCG/ACT nasal spray Place 2 sprays into both nostrils daily. 11/07/13  Yes Chesley Mires, MD  folic acid (FOLVITE) 818 MCG tablet Take 400 mcg by mouth daily.   Yes [provider]  glipiZIDE (GLUCOTROL XL) 10 MG 24 hr tablet Take 10 mg 2 (two) times daily by mouth.    Yes [provider]  HUMALOG MIX 75/25 KWIKPEN (75-25) 100 UNIT/ML SUSP Inject 15 Units into the skin daily.  09/01/11  Yes [provider]  insulin glargine (LANTUS) 100 UNIT/ML injection Inject 30 Units into the skin at bedtime.    Yes [provider]  ipratropium-albuterol (DUONEB) 0.5-2.5 (3) MG/3ML SOLN Take 3 mLs by nebulization every 6 (six) hours as needed. Patient taking differently: Take 3 mLs every 6 (six) hours as needed by nebulization (SOB).  10/05/16  Yes Chesley Mires, MD  iron  polysaccharides (NIFEREX) 150 MG capsule Take 150 mg by mouth 2 (two) times daily.   Yes [provider]  LORazepam (ATIVAN) 1 MG tablet Take 0.5-1 tablets (0.5-1 mg total) by mouth See admin instructions. Take 0.5 tablet every morning and 1 tablet in the evening as needed for sleep 12/02/16  Yes Velvet Bathe, MD  magnesium oxide (MAG-OX) 400 MG tablet Take 400 mg by mouth daily.   Yes [provider]  methocarbamol (ROBAXIN) 500 MG tablet Take 500 mg 3 (three) times daily as needed by mouth for muscle spasms.  02/07/14  Yes [provider]  montelukast (SINGULAIR) 10 MG tablet Take 1 tablet (10 mg total) by mouth at bedtime. 10/05/16  Yes Chesley Mires, MD  NEXIUM 40 MG  capsule Take 1 capsule in morning by mouth 10/24/13  Yes [provider]  Omega-3 Fatty Acids (FISH OIL) 1000 MG CAPS Take 1 capsule by mouth daily.   Yes [provider]  potassium chloride SA (K-DUR,KLOR-CON) 20 MEQ tablet Take 20 mEq by mouth daily.   Yes [provider]  prochlorperazine (COMPAZINE) 5 MG tablet Take 5 mg by mouth every 6 (six) hours as needed for nausea.   Yes [provider]  Propylene Glycol (SYSTANE BALANCE) 0.6 % SOLN Apply 1 drop to eye 2 (two) times daily.   Yes [provider]  pyridOXINE (VITAMIN B-6) 100 MG tablet Take 100 mg by mouth daily.   Yes [provider]  torsemide (DEMADEX) 10 MG tablet Take 5 tablets (50 mg total) by mouth 2 (two) times daily. 12/02/16  Yes Velvet Bathe, MD  traMADol (ULTRAM) 50 MG tablet Take 50 mg by mouth every 6 (six) hours as needed for severe pain.   Yes [provider]  traZODone (DESYREL) 50 MG tablet Take 50 mg at bedtime as needed by mouth for sleep.   Yes [provider]  vitamin B-12 (CYANOCOBALAMIN) 1000 MCG tablet Take 1,000 mcg by mouth daily.   Yes [provider]  Vitamin D, Ergocalciferol, (DRISDOL) 50000 UNITS CAPS capsule Take 50,000 Units by mouth 2  (two) times a week. 04/07/14  Yes [provider]  vitamin E 400 UNIT capsule Take 400 Units by mouth daily.   Yes [provider]  ZOCOR 20 MG tablet Take 20 mg by mouth daily. 03/16/14  Yes [provider]    Family History Family History  Problem Relation Age of Onset  . Breast cancer Maternal Aunt     Social History Social History   Tobacco Use  . Smoking status: Former Smoker    Packs/day: 2.00    Years: 5.00    Pack years: 10.00    Types: Cigarettes    Last attempt to quit: 01/10/1958    Years since quitting: 59.2  . Smokeless tobacco: Never Used  Substance Use Topics  . Alcohol use: No    Comment: Quit when she quit smoking  . Drug use: No     Allergies   Other   Review of Systems Review of Systems 10 Systems reviewed and are negative for acute change except as noted in the HPI.   Physical Exam Updated Vital Signs BP (!) 133/103   Pulse 74   Temp (!) 96.8 F (36 C) (Axillary)   Resp (!) 41   Wt 102.1 kg (225 lb)   SpO2 94%   BMI 39.86 kg/m   Physical Exam  Constitutional: She is oriented to person, place, and time.  Patient is alert and interactive.  No respiratory distress.  Obese and deconditioned.  HENT:  Head: Normocephalic and atraumatic.  Mucous membranes are moist.  No facial injury.  Eyes: Pupils are equal, round, and reactive to light. EOM are normal.  Neck: Neck supple.  Cardiovascular: Normal rate, regular rhythm, normal heart sounds and intact distal pulses.  Pulmonary/Chest: Effort normal and breath sounds normal.  Abdominal: Soft. She exhibits no distension. There is no tenderness. There is no guarding.  Musculoskeletal:  Patient has severe, chronic appearing edema bilateral lower extremities.  Skin is thickened and cobblestoned in appearance.  See attached images.  Neurological: She is alert and oriented to person, place, and time. No cranial nerve deficit. She exhibits normal muscle tone. Coordination normal.   Skin: Skin  is warm and dry.  Psychiatric: She has a normal mood and affect.         ED Treatments / Results  Labs (all labs ordered are listed, but only abnormal results are displayed) Labs Reviewed  COMPREHENSIVE METABOLIC PANEL - Abnormal; Notable for the following components:      Result Value   Chloride 117 (*)    CO2 20 (*)    BUN 52 (*)    Creatinine, Ser 2.09 (*)    Calcium 8.7 (*)    Albumin 3.0 (*)    GFR calc non Af Amer 22 (*)    GFR calc Af Amer 26 (*)    All other components within normal limits  CBC WITH DIFFERENTIAL/PLATELET - Abnormal; Notable for the following components:   RBC 3.11 (*)    Hemoglobin 9.2 (*)    HCT 27.9 (*)    RDW 16.2 (*)    Platelets 146 (*)    All other components within normal limits  URINALYSIS, ROUTINE W REFLEX MICROSCOPIC - Abnormal; Notable for the following components:   Protein, ur 30 (*)    Bacteria, UA RARE (*)    Squamous Epithelial / LPF 0-5 (*)    All other components within normal limits  BRAIN NATRIURETIC PEPTIDE - Abnormal; Notable for the following components:   B Natriuretic Peptide 175.8 (*)    All other components within normal limits  I-STAT CG4 LACTIC ACID, ED - Abnormal; Notable for the following components:   Lactic Acid, Venous 0.42 (*)    All other components within normal limits  CBG MONITORING, ED - Abnormal; Notable for the following components:   Glucose-Capillary 152 (*)    All other components within normal limits  CBG MONITORING, ED - Abnormal; Notable for the following components:   Glucose-Capillary 119 (*)    All other components within normal limits  URINE CULTURE  LIPASE, BLOOD  PROTIME-INR  CBG MONITORING, ED  I-STAT TROPONIN, ED  I-STAT CG4 LACTIC ACID, ED  I-STAT TROPONIN, ED  CBG MONITORING, ED    EKG EKG Interpretation  Date/Time:  Monday April 03 2017 15:21:59 EDT Ventricular Rate:  63 PR Interval:    QRS Duration: 127 QT Interval:  447 QTC Calculation: 454 R  Axis:   55 Text Interpretation:  Sinus rhythm Short PR interval Nonspecific intraventricular conduction delay no change from previous Confirmed by Charlesetta Shanks [phone]) on 04/03/2017 5:51:18 PM   Radiology Dg Chest Port 1 View  Result Date: 04/03/2017 CLINICAL DATA:  Hypoglycemia.  Weakness. EXAM: PORTABLE CHEST 1 VIEW COMPARISON:  11/30/2016 FINDINGS: Stable enlargement of the cardiopericardial silhouette. No mediastinal or hilar masses. There is vascular congestion, without overt pulmonary edema. No lung consolidation to suggest pneumonia. No convincing pleural effusion and no pneumothorax. Skeletal structures are grossly intact. IMPRESSION: 1. No acute cardiopulmonary disease. 2. Stable cardiomegaly. Electronically Signed   By: Lajean Manes M.D.   On: 04/03/2017 16:50    Procedures Procedures (including critical care time) CRITICAL CARE Performed by: Si Gaul   Total critical care time: 45 minutes  Critical care time was exclusive of separately billable procedures and treating other patients.  Critical care was necessary to treat or prevent imminent or life-threatening deterioration.  Critical care was time spent personally by me on the following activities: development of treatment plan with patient and/or surrogate as well as nursing, discussions with consultants, evaluation of patient's response to treatment, examination of patient, obtaining history from patient or surrogate, ordering and performing treatments  and interventions, ordering and review of laboratory studies, ordering and review of radiographic studies, pulse oximetry and re-evaluation of patient's condition. Medications Ordered in ED Medications  dextrose 50 % solution (has no administration in time range)  nitroGLYCERIN (NITROGLYN) 2 % ointment 1 inch (has no administration in time range)  amLODipine (NORVASC) tablet 10 mg (10 mg Oral Given 04/03/17 1821)  0.9 %  sodium chloride infusion ( Intravenous New  Bag/Given 04/03/17 1821)  ipratropium-albuterol (DUONEB) 0.5-2.5 (3) MG/3ML nebulizer solution 3 mL (3 mLs Nebulization Given 04/03/17 2100)  hydrALAZINE (APRESOLINE) injection 20 mg (20 mg Intravenous Given 04/03/17 2101)  furosemide (LASIX) injection 40 mg (40 mg Intravenous Given 04/03/17 2100)  hydrALAZINE (APRESOLINE) injection 20 mg (20 mg Intravenous Given 04/03/17 2304)  methylPREDNISolone sodium succinate (SOLU-MEDROL) 125 mg/2 mL injection 125 mg (125 mg Intravenous Given 04/03/17 2305)  ipratropium-albuterol (DUONEB) 0.5-2.5 (3) MG/3ML nebulizer solution 3 mL (3 mLs Nebulization Given 04/03/17 2304)     Initial Impression / Assessment and Plan / ED Course  I have reviewed the triage vital signs and the nursing notes.  Pertinent labs & imaging results that were available during my care of the patient were reviewed by me and considered in my medical decision making (see chart for details).    Patient has become increasingly short of breath during her treatment and observation.  Patient was given amlodipine and hydralazine for blood pressure control.  DuoNeb and Lasix ordered.  Upon recheck after therapies at 09: 40 patient has increased work of breathing and endorses increasing dyspnea.  She is producing urine but has crackles at lung bases.  Will plan to admit.  Final Clinical Impressions(s) / ED Diagnoses   Final diagnoses:  Hypoglycemia  Chronic obstructive pulmonary disease, unspecified COPD type ([place])  Acute on chronic congestive heart failure, unspecified heart failure type ([place])  Severe comorbid illness  Hypertensive urgency  Patient's initial presentation consistent with hypoglycemia secondary to insulin.  She does have significant and severe comorbid illness.  Mental status is alert and appropriate.  Over the course of her diagnostic evaluation, patient developed increasing shortness of breath with wheezing.  She was significantly hypertensive but denied chest pain or shortness  of breath on arrival.  Hypertension treated with amlodipine and hydralazine.  As patient developed dyspnea DuoNeb and nitroglycerin and Lasix added.  Plan will be for patient admission for hypertensive urgency\emergency with CHF and COPD exacerbation.  Hypoglycemia has stabilized.  ED Discharge Orders    None       Charlesetta Shanks, MD 04/04/17 Lynnell Catalan    Charlesetta Shanks, MD 04/04/17 [phone]

## 2017-04-03 NOTE — ED Triage Notes (Signed)
Pt brought to ED via [facility] with caregiver c/o found pt unresponsive this afternoon-CBG was 56 for EMS, received Dextrose-- repeat CBG was 95- last seen normal Saturday- pt lives alone-- states has a caregiver comes three times a week--  Pt was wearing an incontinence brief that was saturated with old urine, strong smell of urine on pt.

## 2017-04-03 NOTE — ED Notes (Signed)
Pt placed on hospital bed with Peri wick in place-Monique,RN

## 2017-04-04 ENCOUNTER — Encounter (HOSPITAL_COMMUNITY): Payer: Self-pay | Admitting: Internal Medicine

## 2017-04-04 DIAGNOSIS — G4733 Obstructive sleep apnea (adult) (pediatric): Secondary | ICD-10-CM | POA: Diagnosis present

## 2017-04-04 DIAGNOSIS — I509 Heart failure, unspecified: Secondary | ICD-10-CM

## 2017-04-04 DIAGNOSIS — E11649 Type 2 diabetes mellitus with hypoglycemia without coma: Secondary | ICD-10-CM | POA: Diagnosis present

## 2017-04-04 DIAGNOSIS — Z96653 Presence of artificial knee joint, bilateral: Secondary | ICD-10-CM | POA: Diagnosis present

## 2017-04-04 DIAGNOSIS — N184 Chronic kidney disease, stage 4 (severe): Secondary | ICD-10-CM | POA: Diagnosis not present

## 2017-04-04 DIAGNOSIS — L814 Other melanin hyperpigmentation: Secondary | ICD-10-CM | POA: Diagnosis present

## 2017-04-04 DIAGNOSIS — Z85528 Personal history of other malignant neoplasm of kidney: Secondary | ICD-10-CM | POA: Diagnosis not present

## 2017-04-04 DIAGNOSIS — Z86010 Personal history of colonic polyps: Secondary | ICD-10-CM | POA: Diagnosis not present

## 2017-04-04 DIAGNOSIS — D696 Thrombocytopenia, unspecified: Secondary | ICD-10-CM | POA: Diagnosis present

## 2017-04-04 DIAGNOSIS — D631 Anemia in chronic kidney disease: Secondary | ICD-10-CM | POA: Diagnosis present

## 2017-04-04 DIAGNOSIS — K219 Gastro-esophageal reflux disease without esophagitis: Secondary | ICD-10-CM | POA: Diagnosis present

## 2017-04-04 DIAGNOSIS — J449 Chronic obstructive pulmonary disease, unspecified: Secondary | ICD-10-CM | POA: Diagnosis not present

## 2017-04-04 DIAGNOSIS — I878 Other specified disorders of veins: Secondary | ICD-10-CM | POA: Diagnosis present

## 2017-04-04 DIAGNOSIS — E162 Hypoglycemia, unspecified: Secondary | ICD-10-CM | POA: Diagnosis not present

## 2017-04-04 DIAGNOSIS — I160 Hypertensive urgency: Secondary | ICD-10-CM | POA: Diagnosis not present

## 2017-04-04 DIAGNOSIS — Z791 Long term (current) use of non-steroidal anti-inflammatories (NSAID): Secondary | ICD-10-CM | POA: Diagnosis not present

## 2017-04-04 DIAGNOSIS — E7800 Pure hypercholesterolemia, unspecified: Secondary | ICD-10-CM | POA: Diagnosis present

## 2017-04-04 DIAGNOSIS — I5033 Acute on chronic diastolic (congestive) heart failure: Secondary | ICD-10-CM | POA: Diagnosis present

## 2017-04-04 DIAGNOSIS — Z7951 Long term (current) use of inhaled steroids: Secondary | ICD-10-CM | POA: Diagnosis not present

## 2017-04-04 DIAGNOSIS — I130 Hypertensive heart and chronic kidney disease with heart failure and stage 1 through stage 4 chronic kidney disease, or unspecified chronic kidney disease: Secondary | ICD-10-CM | POA: Diagnosis present

## 2017-04-04 DIAGNOSIS — Z6839 Body mass index (BMI) 39.0-39.9, adult: Secondary | ICD-10-CM | POA: Diagnosis not present

## 2017-04-04 DIAGNOSIS — Z8711 Personal history of peptic ulcer disease: Secondary | ICD-10-CM | POA: Diagnosis not present

## 2017-04-04 DIAGNOSIS — F329 Major depressive disorder, single episode, unspecified: Secondary | ICD-10-CM | POA: Diagnosis present

## 2017-04-04 DIAGNOSIS — J9601 Acute respiratory failure with hypoxia: Secondary | ICD-10-CM | POA: Diagnosis present

## 2017-04-04 DIAGNOSIS — E1122 Type 2 diabetes mellitus with diabetic chronic kidney disease: Secondary | ICD-10-CM | POA: Diagnosis present

## 2017-04-04 DIAGNOSIS — Y92009 Unspecified place in unspecified non-institutional (private) residence as the place of occurrence of the external cause: Secondary | ICD-10-CM | POA: Diagnosis not present

## 2017-04-04 DIAGNOSIS — Z794 Long term (current) use of insulin: Secondary | ICD-10-CM | POA: Diagnosis not present

## 2017-04-04 LAB — BASIC METABOLIC PANEL
Anion gap: 12 (ref 5–15)
BUN: 53 mg/dL — AB (ref 6–20)
CHLORIDE: 113 mmol/L — AB (ref 101–111)
CO2: 17 mmol/L — ABNORMAL LOW (ref 22–32)
Calcium: 8.7 mg/dL — ABNORMAL LOW (ref 8.9–10.3)
Creatinine, Ser: 2.14 mg/dL — ABNORMAL HIGH (ref 0.44–1.00)
GFR calc non Af Amer: 21 mL/min — ABNORMAL LOW (ref >60)
GFR, EST AFRICAN AMERICAN: 25 mL/min — AB (ref >60)
Glucose, Bld: 160 mg/dL — ABNORMAL HIGH (ref 65–99)
POTASSIUM: 4.9 mmol/L (ref 3.5–5.1)
SODIUM: 142 mmol/L (ref 135–145)

## 2017-04-04 LAB — CBC
HEMATOCRIT: 28.6 % — AB (ref 36.0–46.0)
Hemoglobin: 9.5 g/dL — ABNORMAL LOW (ref 12.0–15.0)
MCH: 29.5 pg (ref 26.0–34.0)
MCHC: 33.2 g/dL (ref 30.0–36.0)
MCV: 88.8 fL (ref 78.0–100.0)
Platelets: 167 10*3/uL (ref 150–400)
RBC: 3.22 MIL/uL — AB (ref 3.87–5.11)
RDW: 16.2 % — ABNORMAL HIGH (ref 11.5–15.5)
WBC: 9.1 10*3/uL (ref 4.0–10.5)

## 2017-04-04 LAB — BRAIN NATRIURETIC PEPTIDE: B Natriuretic Peptide: 175.8 pg/mL — ABNORMAL HIGH (ref 0.0–100.0)

## 2017-04-04 LAB — TSH: TSH: 1.434 u[IU]/mL (ref 0.350–4.500)

## 2017-04-04 LAB — TROPONIN I
TROPONIN I: 0.05 ng/mL — AB (ref <0.03)
Troponin I: 0.04 ng/mL (ref <0.03)

## 2017-04-04 LAB — MRSA PCR SCREENING: MRSA by PCR: NEGATIVE

## 2017-04-04 LAB — CBG MONITORING, ED
GLUCOSE-CAPILLARY: 162 mg/dL — AB (ref 65–99)
GLUCOSE-CAPILLARY: 189 mg/dL — AB (ref 65–99)
Glucose-Capillary: 166 mg/dL — ABNORMAL HIGH (ref 65–99)

## 2017-04-04 MED ORDER — SIMVASTATIN 20 MG PO TABS
20.0000 mg | ORAL_TABLET | Freq: Every day | ORAL | Status: DC
  Administered 2017-04-05 – 2017-04-07 (×3): 20 mg via ORAL
  Filled 2017-04-04 (×4): qty 1

## 2017-04-04 MED ORDER — ONDANSETRON HCL 4 MG/2ML IJ SOLN: 4.0000 mg | Freq: Four times a day (QID) | INTRAMUSCULAR | Status: DC | PRN

## 2017-04-04 MED ORDER — ALBUTEROL SULFATE (2.5 MG/3ML) 0.083% IN NEBU: 2.5000 mg | INHALATION_SOLUTION | RESPIRATORY_TRACT | Status: DC | PRN

## 2017-04-04 MED ORDER — POLYVINYL ALCOHOL 1.4 % OP SOLN
1.0000 [drp] | Freq: Two times a day (BID) | OPHTHALMIC | Status: DC
  Administered 2017-04-05 – 2017-04-08 (×6): 1 [drp] via OPHTHALMIC
  Filled 2017-04-04 (×3): qty 15

## 2017-04-04 MED ORDER — ACETAMINOPHEN 650 MG RE SUPP: 650.0000 mg | Freq: Four times a day (QID) | RECTAL | Status: DC | PRN

## 2017-04-04 MED ORDER — VITAMIN E 180 MG (400 UNIT) PO CAPS: 400.0000 [IU] | ORAL_CAPSULE | Freq: Every day | ORAL | Status: DC

## 2017-04-04 MED ORDER — METHOCARBAMOL 500 MG PO TABS: 500.0000 mg | ORAL_TABLET | Freq: Three times a day (TID) | ORAL | Status: DC | PRN

## 2017-04-04 MED ORDER — MAGNESIUM OXIDE 400 (241.3 MG) MG PO TABS
400.0000 mg | ORAL_TABLET | Freq: Every day | ORAL | Status: DC
  Administered 2017-04-05 – 2017-04-08 (×4): 400 mg via ORAL
  Filled 2017-04-04 (×4): qty 1

## 2017-04-04 MED ORDER — CALCITRIOL 0.25 MCG PO CAPS
0.2500 ug | ORAL_CAPSULE | ORAL | Status: DC
  Administered 2017-04-05 – 2017-04-07 (×2): 0.25 ug via ORAL
  Filled 2017-04-04 (×2): qty 1

## 2017-04-04 MED ORDER — FUROSEMIDE 10 MG/ML IJ SOLN
80.0000 mg | Freq: Three times a day (TID) | INTRAMUSCULAR | Status: DC
  Administered 2017-04-04 – 2017-04-05 (×4): 80 mg via INTRAVENOUS
  Filled 2017-04-04 (×4): qty 8

## 2017-04-04 MED ORDER — VITAMIN B-12 1000 MCG PO TABS
1000.0000 ug | ORAL_TABLET | Freq: Every day | ORAL | Status: DC
  Administered 2017-04-05 – 2017-04-08 (×4): 1000 ug via ORAL
  Filled 2017-04-04 (×4): qty 1

## 2017-04-04 MED ORDER — OMEGA-3-ACID ETHYL ESTERS 1 G PO CAPS
1.0000 g | ORAL_CAPSULE | Freq: Every day | ORAL | Status: DC
  Administered 2017-04-05 – 2017-04-08 (×4): 1 g via ORAL
  Filled 2017-04-04 (×4): qty 1

## 2017-04-04 MED ORDER — VITAMIN B-6 100 MG PO TABS: 100.0000 mg | ORAL_TABLET | Freq: Every day | ORAL | Status: DC

## 2017-04-04 MED ORDER — TRAZODONE HCL 50 MG PO TABS: 50.0000 mg | ORAL_TABLET | Freq: Every evening | ORAL | Status: DC | PRN

## 2017-04-04 MED ORDER — MONTELUKAST SODIUM 10 MG PO TABS
10.0000 mg | ORAL_TABLET | Freq: Every day | ORAL | Status: DC
  Administered 2017-04-04 – 2017-04-07 (×4): 10 mg via ORAL
  Filled 2017-04-04 (×6): qty 1

## 2017-04-04 MED ORDER — HEPARIN SODIUM (PORCINE) 5000 UNIT/ML IJ SOLN
5000.0000 [IU] | Freq: Three times a day (TID) | INTRAMUSCULAR | Status: DC
  Administered 2017-04-04 – 2017-04-08 (×14): 5000 [IU] via SUBCUTANEOUS
  Filled 2017-04-04 (×14): qty 1

## 2017-04-04 MED ORDER — CALCIUM CARBONATE-VITAMIN D 500-200 MG-UNIT PO TABS
1.0000 | ORAL_TABLET | Freq: Every day | ORAL | Status: DC
  Administered 2017-04-05 – 2017-04-08 (×4): 1 via ORAL
  Filled 2017-04-04 (×5): qty 1

## 2017-04-04 MED ORDER — HYDRALAZINE HCL 20 MG/ML IJ SOLN
10.0000 mg | INTRAMUSCULAR | Status: DC | PRN
  Administered 2017-04-06: 10 mg via INTRAVENOUS
  Filled 2017-04-04: qty 1

## 2017-04-04 MED ORDER — PROCHLORPERAZINE MALEATE 5 MG PO TABS: 5.0000 mg | ORAL_TABLET | Freq: Four times a day (QID) | ORAL | Status: DC | PRN

## 2017-04-04 MED ORDER — ONDANSETRON HCL 4 MG PO TABS
4.0000 mg | ORAL_TABLET | Freq: Four times a day (QID) | ORAL | Status: DC | PRN
  Administered 2017-04-06: 4 mg via ORAL
  Filled 2017-04-04: qty 1

## 2017-04-04 MED ORDER — TRAMADOL HCL 50 MG PO TABS: 50.0000 mg | ORAL_TABLET | Freq: Four times a day (QID) | ORAL | Status: DC | PRN

## 2017-04-04 MED ORDER — FLUTICASONE PROPIONATE 50 MCG/ACT NA SUSP
2.0000 | Freq: Every day | NASAL | Status: DC
  Administered 2017-04-05 – 2017-04-08 (×4): 2 via NASAL
  Filled 2017-04-04: qty 16

## 2017-04-04 MED ORDER — FOLIC ACID 1 MG PO TABS
500.0000 ug | ORAL_TABLET | Freq: Every day | ORAL | Status: DC
  Administered 2017-04-05 – 2017-04-08 (×4): 0.5 mg via ORAL
  Filled 2017-04-04 (×4): qty 1

## 2017-04-04 MED ORDER — IPRATROPIUM-ALBUTEROL 0.5-2.5 (3) MG/3ML IN SOLN
3.0000 mL | Freq: Three times a day (TID) | RESPIRATORY_TRACT | Status: DC
Start: 2017-04-04 — End: 2017-04-08
  Administered 2017-04-04 – 2017-04-08 (×13): 3 mL via RESPIRATORY_TRACT
  Filled 2017-04-04 (×14): qty 3

## 2017-04-04 MED ORDER — AMLODIPINE BESYLATE 10 MG PO TABS: 10.0000 mg | ORAL_TABLET | Freq: Every day | ORAL | Status: DC

## 2017-04-04 MED ORDER — IPRATROPIUM BROMIDE 0.02 % IN SOLN
0.5000 mg | RESPIRATORY_TRACT | Status: DC
  Administered 2017-04-04: 0.5 mg via RESPIRATORY_TRACT
  Filled 2017-04-04: qty 2.5

## 2017-04-04 MED ORDER — PANTOPRAZOLE SODIUM 40 MG PO TBEC
40.0000 mg | DELAYED_RELEASE_TABLET | Freq: Every day | ORAL | Status: DC
  Administered 2017-04-05 – 2017-04-08 (×4): 40 mg via ORAL
  Filled 2017-04-04 (×4): qty 1

## 2017-04-04 MED ORDER — CALCIUM CARBONATE-VITAMIN D 600-400 MG-UNIT PO TABS: 1.0000 | ORAL_TABLET | Freq: Every day | ORAL | Status: DC

## 2017-04-04 MED ORDER — POLYSACCHARIDE IRON COMPLEX 150 MG PO CAPS
150.0000 mg | ORAL_CAPSULE | Freq: Two times a day (BID) | ORAL | Status: DC
  Administered 2017-04-04 – 2017-04-08 (×8): 150 mg via ORAL
  Filled 2017-04-04 (×11): qty 1

## 2017-04-04 MED ORDER — BUDESONIDE 0.25 MG/2ML IN SUSP
0.2500 mg | Freq: Two times a day (BID) | RESPIRATORY_TRACT | Status: DC
  Administered 2017-04-05 – 2017-04-08 (×7): 0.25 mg via RESPIRATORY_TRACT
  Filled 2017-04-04 (×11): qty 2

## 2017-04-04 MED ORDER — FUROSEMIDE 10 MG/ML IJ SOLN
40.0000 mg | Freq: Once | INTRAMUSCULAR | Status: AC
  Administered 2017-04-04: 40 mg via INTRAVENOUS
  Filled 2017-04-04: qty 4

## 2017-04-04 MED ORDER — CYCLOBENZAPRINE HCL 10 MG PO TABS: 10.0000 mg | ORAL_TABLET | Freq: Three times a day (TID) | ORAL | Status: DC | PRN

## 2017-04-04 MED ORDER — ACETAMINOPHEN 325 MG PO TABS: 650.0000 mg | ORAL_TABLET | Freq: Four times a day (QID) | ORAL | Status: DC | PRN

## 2017-04-04 MED ORDER — ALBUTEROL SULFATE (2.5 MG/3ML) 0.083% IN NEBU
2.5000 mg | INHALATION_SOLUTION | RESPIRATORY_TRACT | Status: DC
  Administered 2017-04-04: 2.5 mg via RESPIRATORY_TRACT
  Filled 2017-04-04: qty 3

## 2017-04-04 MED ORDER — VITAMIN D (ERGOCALCIFEROL) 1.25 MG (50000 UNIT) PO CAPS: 50000.0000 [IU] | ORAL_CAPSULE | ORAL | Status: DC

## 2017-04-04 NOTE — Progress Notes (Signed)
Pt resting comfortably on the BiPAP at this time.

## 2017-04-04 NOTE — H&P (Signed)
History and Physical    [facility] VPX:[id number] DOB: [birth year] DOA: 04/03/2017  PCP: Burnard Bunting, MD  Patient coming from: Home.  Chief Complaint: Low blood sugar.  HPI: [name] is a 77 y.o. female with history of chronic kidney disease stage IV, chronic diastolic CHF, hypertension, diabetes mellitus, COPD, sleep apnea, renal cell carcinoma status post ablation, thrombocytopenia presents to the ER after patient was found to be hypoglycemic.  Patient states over the last few days patient has become increasingly hypoglycemic and her home health aide who was visiting her today advised to come to the ER.  Patient also states that over the last 24 hours patient has become increasingly short of breath with frequent productive cough.  Denies any fever chills or chest pain.  ED Course: In the ER patient's blood sugar was around 67 which improved with patient eating.  Patient is found to be hypoxic and short of breath.  Blood pressure was more than 462 systolic.  Was given hydralazine IV 20 mg twice a day along with patient's home dose of amlodipine following which blood pressure improved.  Patient also was given total of 80 mg of IV Lasix.  Chest x-ray is unremarkable on exam patient has increased JVD and lower extremity edema.  Review of Systems: As per HPI, rest all negative.   Past Medical History:  Diagnosis Date  . Adenomatous colon polyp   . Anemia   . Arthritis   . Asthma   . COPD (chronic obstructive pulmonary disease) ([place])   . COPD with asthma ([place]) 04/20/2011  . Depression   . Diabetes mellitus   . Diverticulosis   . Gastric ulcer   . GERD (gastroesophageal reflux disease)   . HTN (hypertension)   . Hypercholesterolemia   . Internal hemorrhoids   . Lumbar disc disease   . OSA (obstructive sleep apnea) 09/17/2012  . Peptic ulcer disease   . 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 Umbilical hernia   . Valgus foot    right     Past Surgical History:    Procedure Laterality Date  . COLONOSCOPY  2209 or 2010    2001, 2005, 2009  colonoscopy.  [place]  . COLONOSCOPY N/A 08/14/2012   Procedure: COLONOSCOPY;  Surgeon: Ladene Artist, MD;  Location: [facility] ENDOSCOPY;  Service: Endoscopy;  Laterality: N/A;  . INCISE AND DRAIN ABCESS     Abdominal Wall  . LIPOMA RESECTION     Right Neck  . TOTAL KNEE ARTHROPLASTY     Bilateral  . UMBILICAL HERNIA REPAIR  2007   incarcerated  . UPPER GASTROINTESTINAL ENDOSCOPY  2001   medoff Grade A esophagitis     reports that she quit smoking about 59 years ago. Her smoking use included cigarettes. She has a 10.00 pack-year smoking history. She has never used smokeless tobacco. She reports that she does not drink alcohol or use drugs.  Allergies  Allergen Reactions  . Other Other (See Comments)    "all generics make her sick"    Family History  Problem Relation Age of Onset  . Breast cancer Maternal Aunt     Prior to Admission medications   Medication Sig Start Date End Date Taking? Authorizing Provider  amLODipine (NORVASC) 5 MG tablet Take 10 mg by mouth daily.    Yes [provider]  augmented betamethasone dipropionate (DIPROLENE-AF) 0.05 % cream Apply 1 application topically 2 (two) times daily. To both legs   Yes  [provider]  budesonide (PULMICORT) 0.25 MG/2ML nebulizer solution Take 2 mLs (0.25 mg total) by nebulization 2 (two) times daily. 10/05/16  Yes Chesley Mires, MD  calcitRIOL (ROCALTROL) 0.25 MCG capsule Take 1 capsule (0.25 mcg total) by mouth every Monday, Wednesday, and Friday. 12/05/16  Yes Velvet Bathe, MD  Calcium Carbonate-Vitamin D (CALTRATE 600+D) 600-400 MG-UNIT per tablet Take 1 tablet by mouth daily.   Yes [provider]  cyclobenzaprine (FLEXERIL) 10 MG tablet Take 10 mg 3 (three) times daily as needed by mouth for muscle spasms.   Yes [provider]  fluticasone (FLONASE) 50 MCG/ACT nasal spray Place 2 sprays into both nostrils  daily. 11/07/13  Yes Chesley Mires, MD  folic acid (FOLVITE) 751 MCG tablet Take 400 mcg by mouth daily.   Yes [provider]  glipiZIDE (GLUCOTROL XL) 10 MG 24 hr tablet Take 10 mg 2 (two) times daily by mouth.    Yes [provider]  HUMALOG MIX 75/25 KWIKPEN (75-25) 100 UNIT/ML SUSP Inject 15 Units into the skin daily.  09/01/11  Yes [provider]  insulin glargine (LANTUS) 100 UNIT/ML injection Inject 30 Units into the skin at bedtime.    Yes [provider]  ipratropium-albuterol (DUONEB) 0.5-2.5 (3) MG/3ML SOLN Take 3 mLs by nebulization every 6 (six) hours as needed. Patient taking differently: Take 3 mLs every 6 (six) hours as needed by nebulization (SOB).  10/05/16  Yes Chesley Mires, MD  iron polysaccharides (NIFEREX) 150 MG capsule Take 150 mg by mouth 2 (two) times daily.   Yes [provider]  LORazepam (ATIVAN) 1 MG tablet Take 0.5-1 tablets (0.5-1 mg total) by mouth See admin instructions. Take 0.5 tablet every morning and 1 tablet in the evening as needed for sleep 12/02/16  Yes Velvet Bathe, MD  magnesium oxide (MAG-OX) 400 MG tablet Take 400 mg by mouth daily.   Yes [provider]  methocarbamol (ROBAXIN) 500 MG tablet Take 500 mg 3 (three) times daily as needed by mouth for muscle spasms.  02/07/14  Yes [provider]  montelukast (SINGULAIR) 10 MG tablet Take 1 tablet (10 mg total) by mouth at bedtime. 10/05/16  Yes Chesley Mires, MD  NEXIUM 40 MG capsule Take 1 capsule in morning by mouth 10/24/13  Yes [provider]  Omega-3 Fatty Acids (FISH OIL) 1000 MG CAPS Take 1 capsule by mouth daily.   Yes [provider]  potassium chloride SA (K-DUR,KLOR-CON) 20 MEQ tablet Take 20 mEq by mouth daily.   Yes [provider]  prochlorperazine (COMPAZINE) 5 MG tablet Take 5 mg by mouth every 6 (six) hours as needed for nausea.   Yes [provider]  Propylene Glycol (SYSTANE BALANCE) 0.6 %  SOLN Apply 1 drop to eye 2 (two) times daily.   Yes [provider]  pyridOXINE (VITAMIN B-6) 100 MG tablet Take 100 mg by mouth daily.   Yes [provider]  torsemide (DEMADEX) 10 MG tablet Take 5 tablets (50 mg total) by mouth 2 (two) times daily. 12/02/16  Yes Velvet Bathe, MD  traMADol (ULTRAM) 50 MG tablet Take 50 mg by mouth every 6 (six) hours as needed for severe pain.   Yes [provider]  traZODone (DESYREL) 50 MG tablet Take 50 mg at bedtime as needed by mouth for sleep.   Yes [provider]  vitamin B-12 (CYANOCOBALAMIN) 1000 MCG tablet Take 1,000 mcg by mouth daily.   Yes [provider]  Vitamin  D, Ergocalciferol, (DRISDOL) 50000 UNITS CAPS capsule Take 50,000 Units by mouth 2 (two) times a week. 04/07/14  Yes [provider]  vitamin E 400 UNIT capsule Take 400 Units by mouth daily.   Yes [provider]  ZOCOR 20 MG tablet Take 20 mg by mouth daily. 03/16/14  Yes [provider]    Physical Exam: Vitals:   04/04/17 0045 04/04/17 0100 04/04/17 0109 04/04/17 0115  BP: 120/61 (!) 156/63  (!) 168/58  Pulse: 85 83 86 77  Resp: (!) 21 (!) 28 (!) 28 (!) 26  Temp:      TempSrc:      SpO2: 95% 91% 98% 97%  Weight:          Constitutional: Moderately built and nourished. Vitals:   04/04/17 0045 04/04/17 0100 04/04/17 0109 04/04/17 0115  BP: 120/61 (!) 156/63  (!) 168/58  Pulse: 85 83 86 77  Resp: (!) 21 (!) 28 (!) 28 (!) 26  Temp:      TempSrc:      SpO2: 95% 91% 98% 97%  Weight:       Eyes: Anicteric no pallor. ENMT: No discharge from the ears eyes nose or mouth. Neck: JVD elevated no mass felt. Respiratory: No rhonchi mild basilar crepitations. Cardiovascular: S1-S2 heard no murmurs appreciated. Abdomen: Soft nontender bowel sounds present. Musculoskeletal: Bilateral lower extremity edema. Skin: Chronic lower extremity skin changes. Neurologic: Alert awake oriented to time place and person.   Moves all extremities. Psychiatric: Appears normal.  Normal affect.   Labs on Admission: I have personally reviewed following labs and imaging studies  CBC: Recent Labs  Lab 04/03/17 1542  WBC 5.6  NEUTROABS 3.9  HGB 9.2*  HCT 27.9*  MCV 89.7  PLT 751*   Basic Metabolic Panel: Recent Labs  Lab 04/03/17 1542  NA 144  K 4.3  CL 117*  CO2 20*  GLUCOSE 67  BUN 52*  CREATININE 2.09*  CALCIUM 8.7*   GFR: Estimated Creatinine Clearance: 26.5 mL/min (A) (by C-G formula based on SCr of 2.09 mg/dL (H)). Liver Function Tests: Recent Labs  Lab 04/03/17 1542  AST 32  ALT 43  ALKPHOS 112  BILITOT 0.6  PROT 7.0  ALBUMIN 3.0*   Recent Labs  Lab 04/03/17 1542  LIPASE 21   No results for input(s): AMMONIA in the last 168 hours. Coagulation Profile: Recent Labs  Lab 04/03/17 1542  INR 1.20   Cardiac Enzymes: No results for input(s): CKTOTAL, CKMB, CKMBINDEX, TROPONINI in the last 168 hours. BNP (last 3 results) No results for input(s): PROBNP in the last 8760 hours. HbA1C: No results for input(s): HGBA1C in the last 72 hours. CBG: Recent Labs  Lab 04/03/17 1519 04/03/17 1819 04/03/17 2259  GLUCAP 65 152* 119*   Lipid Profile: No results for input(s): CHOL, HDL, LDLCALC, TRIG, CHOLHDL, LDLDIRECT in the last 72 hours. Thyroid Function Tests: No results for input(s): TSH, T4TOTAL, FREET4, T3FREE, THYROIDAB in the last 72 hours. Anemia Panel: No results for input(s): VITAMINB12, FOLATE, FERRITIN, TIBC, IRON, RETICCTPCT in the last 72 hours. Urine analysis:    Component Value Date/Time   COLORURINE YELLOW 04/03/2017 [place] 04/03/2017 1518   LABSPEC 1.011 04/03/2017 1518   PHURINE 5.0 04/03/2017 1518   GLUCOSEU NEGATIVE 04/03/2017 1518   HGBUR NEGATIVE 04/03/2017 1518   BILIRUBINUR NEGATIVE 04/03/2017 1518   KETONESUR NEGATIVE 04/03/2017 1518   PROTEINUR 30 (A) 04/03/2017 1518   UROBILINOGEN 0.2 12/16/2011 1717   NITRITE  NEGATIVE  04/03/2017 1518   LEUKOCYTESUR NEGATIVE 04/03/2017 1518   Sepsis Labs: @LABRCNTIP (procalcitonin:4,lacticidven:4) )No results found for this or any previous visit (from the past 240 hour(s)).   Radiological Exams on Admission: Dg Chest Port 1 View  Result Date: 04/03/2017 CLINICAL DATA:  Hypoglycemia.  Weakness. EXAM: PORTABLE CHEST 1 VIEW COMPARISON:  11/30/2016 FINDINGS: Stable enlargement of the cardiopericardial silhouette. No mediastinal or hilar masses. There is vascular congestion, without overt pulmonary edema. No lung consolidation to suggest pneumonia. No convincing pleural effusion and no pneumothorax. Skeletal structures are grossly intact. IMPRESSION: 1. No acute cardiopulmonary disease. 2. Stable cardiomegaly. Electronically Signed   By: Lajean Manes M.D.   On: 04/03/2017 16:50    EKG: Independently reviewed.  Normal sinus rhythm with nonspecific ST-T changes.  Assessment/Plan Principal Problem:   Acute respiratory failure with hypoxia (HCC) Active Problems:   COPD with asthma (HCC)   OSA (obstructive sleep apnea)   Hypertensive urgency   CKD (chronic kidney disease) stage 4, GFR 15-29 ml/min (HCC)    1. Acute respiratory failure with hypoxia likely multifactorial including acute on chronic diastolic CHF with hypertensive urgency.  Patient also has progressive renal disease.  At this time I have placed patient on Lasix 80 mg IV q. 8 hourly along with as needed IV hydralazine for systolic blood pressure more than 160.  Continue amlodipine.  Closely follow intake output daily weights and metabolic panel.  If patient does not have adequate urine output may discuss with nephrologist.  Will also place patient on BiPAP.  Patient's last EF measured in November 2018 was 60-65% with grade 2 diastolic dysfunction. 2. 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 3. Hypertensive  urgency -see #1. 4. COPD -patient has been placed on nebulizer and Pulmicort.  Did receive 1 dose of Lasix in the ER. 5. OSA on CPAP. 6. Anemia likely from renal disease -follow CBC. 7. Thrombocytopenia appears to be chronic.   DVT prophylaxis: Heparin. Code Status: Full code. Family Communication: Discussed with patient. Disposition Plan: Home. Consults called: None. Admission status: Inpatient.   Rise Patience MD [facility] Pager [phone].  If 7PM-7AM, please contact night-coverage [email or web address] Password [email or web address]  04/04/2017, 1:35 AM

## 2017-04-04 NOTE — Progress Notes (Signed)
Pt placed on NIV at this time for increase WOB and worsening SOB.

## 2017-04-04 NOTE — ED Notes (Signed)
Renal/Carb Modified 1200 mL fluid restrictions-per RN-called by Levada Dy

## 2017-04-04 NOTE — ED Notes (Signed)
Dr.Kay at [facility] [place]

## 2017-04-04 NOTE — ED Notes (Signed)
Dr.Kay paged to notifiy of critical lab-Monique,RN

## 2017-04-04 NOTE — Progress Notes (Signed)
PROGRESS NOTE    [name]  XLK:[id number] DOB: 26-Dec-1940 DOA: 04/03/2017 PCP: Burnard Bunting, MD  Brief Narrative:[name] is a 77 y.o. female with history of chronic kidney disease stage IV, chronic diastolic CHF, hypertension, diabetes mellitus, COPD, sleep apnea, renal cell carcinoma status post ablation, thrombocytopenia presented to the ER after patient was found to be hypoglycemic. Patient also stated that over the last 24 hours patient has become increasingly short of breath with frequent productive cough  Assessment & Plan:   Principal Problem:   Acute respiratory failure with hypoxia ([place]) -due to acute on chronic diastolic CHF/Mod to severe MR, mod TR -due to CKD4 -wean off BiPAP this morning -continue IV Lasix 80 mg 3 times a day -Admits compliance with diuretics however unable to tell me what her home dose is -Monitor urine output and weights  Hypoglycemia/DM 2 -given history of type 2 diabetes with stage IV kidney disease will stop Glucotrol at discharge, -Sliding-scale insulin for now, slowly resume Humalog 75/25 at a lower dose tomorrow -home medication also lists Lantus unclear if she is taking both  Acute on chronic diastolic CHF -diuresis as above  CKD4 -baseline creatinine in the 2.5-3 range -Creatinine better than recent baseline likely secondary to volume overload -Monitor with diuresis  COPD -Stable, no wheezing, nebs PRN  Sleep apnea -CPAP QHS  Hypertensive urgency -Hypertensive urgency likely secondary to volume overloaded state -continue amlodipine, diurese aggressively with IV Lasix -hydralazine when necessary  DVT prophylaxis:heparin subcutaneous Code Status: full code Family Communication:no family at bedside Disposition Plan: awaiting stepdown bed  Consultants:    Procedures:   Antimicrobials:    Subjective: -patient seen in the emergency room, on BiPAP, alert and awake -Able to mumble that breathing is  improving  Objective: Vitals:   04/04/17 0930 04/04/17 1000 04/04/17 1030 04/04/17 1100  BP: (!) 123/57 (!) 135/54 (!) 121/57 (!) 164/71  Pulse: (!) 51 76 71 88  Resp: (!) 23 (!) 24 (!) 25 20  Temp:      TempSrc:      SpO2: 100% 100% 100% 97%  Weight:        Intake/Output Summary (Last 24 hours) at 04/04/2017 1132 Last data filed at 04/04/2017 0106 Gross per 24 hour  Intake -  Output 500 ml  Net -500 ml   Filed Weights   04/03/17 1527  Weight: 102.1 kg (225 lb)    Examination:  General exam: morbidly obese, chronically ill elderly female Respiratory system: poor air movement, bibasilar crackles Cardiovascular system: S1-S2/regular rate rhythm, diastolic murmur noted Gastrointestinal system: Abdomen is nondistended, soft and nontender.Normal bowel sounds heard. Central nervous system: Alert and oriented. No focal neurological deficits. Extremities: 1+ edema Skin: chronic venous change stasis changes and hyperpigmentation in both lower legs Psychiatry: Judgement and insight appear normal. Mood & affect appropriate.     Data Reviewed:   CBC: Recent Labs  Lab 04/03/17 1542 04/04/17 0204  WBC 5.6 9.1  NEUTROABS 3.9  --   HGB 9.2* 9.5*  HCT 27.9* 28.6*  MCV 89.7 88.8  PLT 146* 366   Basic Metabolic Panel: Recent Labs  Lab 04/03/17 1542 04/04/17 0204  NA 144 142  K 4.3 4.9  CL 117* 113*  CO2 20* 17*  GLUCOSE 67 160*  BUN 52* 53*  CREATININE 2.09* 2.14*  CALCIUM 8.7* 8.7*   GFR: Estimated Creatinine Clearance: 25.9 mL/min (A) (by C-G formula based on SCr of 2.14 mg/dL (H)). Liver Function Tests: Recent Labs  Lab  04/03/17 1542  AST 32  ALT 43  ALKPHOS 112  BILITOT 0.6  PROT 7.0  ALBUMIN 3.0*   Recent Labs  Lab 04/03/17 1542  LIPASE 21   No results for input(s): AMMONIA in the last 168 hours. Coagulation Profile: Recent Labs  Lab 04/03/17 1542  INR 1.20   Cardiac Enzymes: Recent Labs  Lab 04/04/17 0204 04/04/17 0843  TROPONINI 0.04*  0.05*   BNP (last 3 results) No results for input(s): PROBNP in the last 8760 hours. HbA1C: No results for input(s): HGBA1C in the last 72 hours. CBG: Recent Labs  Lab 04/03/17 1519 04/03/17 1819 04/03/17 2259 04/04/17 0247 04/04/17 0450  GLUCAP 65 152* 119* 166* 162*   Lipid Profile: No results for input(s): CHOL, HDL, LDLCALC, TRIG, CHOLHDL, LDLDIRECT in the last 72 hours. Thyroid Function Tests: Recent Labs    04/04/17 0204  TSH 1.434   Anemia Panel: No results for input(s): VITAMINB12, FOLATE, FERRITIN, TIBC, IRON, RETICCTPCT in the last 72 hours. Urine analysis:    Component Value Date/Time   COLORURINE YELLOW 04/03/2017 [place] 04/03/2017 1518   LABSPEC 1.011 04/03/2017 1518   PHURINE 5.0 04/03/2017 1518   GLUCOSEU NEGATIVE 04/03/2017 1518   HGBUR NEGATIVE 04/03/2017 1518   BILIRUBINUR NEGATIVE 04/03/2017 1518   KETONESUR NEGATIVE 04/03/2017 1518   PROTEINUR 30 (A) 04/03/2017 1518   UROBILINOGEN 0.2 12/16/2011 1717   NITRITE NEGATIVE 04/03/2017 1518   LEUKOCYTESUR NEGATIVE 04/03/2017 1518   Sepsis Labs: @LABRCNTIP (procalcitonin:4,lacticidven:4)  )No results found for this or any previous visit (from the past 240 hour(s)).       Radiology Studies: Dg Chest Port 1 View  Result Date: 04/03/2017 CLINICAL DATA:  Hypoglycemia.  Weakness. EXAM: PORTABLE CHEST 1 VIEW COMPARISON:  11/30/2016 FINDINGS: Stable enlargement of the cardiopericardial silhouette. No mediastinal or hilar masses. There is vascular congestion, without overt pulmonary edema. No lung consolidation to suggest pneumonia. No convincing pleural effusion and no pneumothorax. Skeletal structures are grossly intact. IMPRESSION: 1. No acute cardiopulmonary disease. 2. Stable cardiomegaly. Electronically Signed   By: Lajean Manes M.D.   On: 04/03/2017 16:50        Scheduled Meds: . [START ON 04/05/2017] amLODipine  10 mg Oral Daily  . budesonide  0.25 mg Nebulization BID  .  [START ON 04/05/2017] calcitRIOL  0.25 mcg Oral Q M,W,F  . Calcium Carbonate-Vitamin D  1 tablet Oral Daily  . fluticasone  2 spray Each Nare Daily  . folic acid  627 mcg Oral Daily  . furosemide  80 mg Intravenous Q8H  . heparin  5,000 Units Subcutaneous Q8H  . ipratropium-albuterol  3 mL Nebulization TID  . iron polysaccharides  150 mg Oral BID  . magnesium oxide  400 mg Oral Daily  . montelukast  10 mg Oral QHS  . nitroGLYCERIN  1 inch Topical Q6H  . omega-3 acid ethyl esters  1 g Oral Daily  . pantoprazole  40 mg Oral Daily  . Propylene Glycol  1 drop Ophthalmic BID  . simvastatin  20 mg Oral q1800  . vitamin B-12  1,000 mcg Oral Daily   Continuous Infusions:   LOS: 0 days    Time spent: 94min    Domenic Polite, MD [facility] Page via [email or web address], password [email or web address] After 7PM please contact night-coverage  04/04/2017, 11:32 AM

## 2017-04-05 DIAGNOSIS — J449 Chronic obstructive pulmonary disease, unspecified: Secondary | ICD-10-CM

## 2017-04-05 DIAGNOSIS — N184 Chronic kidney disease, stage 4 (severe): Secondary | ICD-10-CM

## 2017-04-05 LAB — GLUCOSE, CAPILLARY
GLUCOSE-CAPILLARY: 227 mg/dL — AB (ref 65–99)
GLUCOSE-CAPILLARY: 247 mg/dL — AB (ref 65–99)
Glucose-Capillary: 180 mg/dL — ABNORMAL HIGH (ref 65–99)
Glucose-Capillary: 152 mg/dL — ABNORMAL HIGH (ref 65–99)
Glucose-Capillary: 235 mg/dL — ABNORMAL HIGH (ref 65–99)
Glucose-Capillary: 235 mg/dL — ABNORMAL HIGH (ref 65–99)
Glucose-Capillary: 225 mg/dL — ABNORMAL HIGH (ref 65–99)

## 2017-04-05 LAB — BASIC METABOLIC PANEL
ANION GAP: 11 (ref 5–15)
BUN: 70 mg/dL — ABNORMAL HIGH (ref 6–20)
CHLORIDE: 112 mmol/L — AB (ref 101–111)
CO2: 20 mmol/L — AB (ref 22–32)
Calcium: 8.2 mg/dL — ABNORMAL LOW (ref 8.9–10.3)
Creatinine, Ser: 2.53 mg/dL — ABNORMAL HIGH (ref 0.44–1.00)
GFR calc Af Amer: 20 mL/min — ABNORMAL LOW (ref >60)
GFR calc non Af Amer: 17 mL/min — ABNORMAL LOW (ref >60)
Glucose, Bld: 203 mg/dL — ABNORMAL HIGH (ref 65–99)
POTASSIUM: 5.1 mmol/L (ref 3.5–5.1)
Sodium: 143 mmol/L (ref 135–145)

## 2017-04-05 LAB — CBC
HEMATOCRIT: 26 % — AB (ref 36.0–46.0)
HEMOGLOBIN: 8.4 g/dL — AB (ref 12.0–15.0)
MCH: 29.6 pg (ref 26.0–34.0)
MCHC: 32.3 g/dL (ref 30.0–36.0)
MCV: 91.5 fL (ref 78.0–100.0)
Platelets: 174 10*3/uL (ref 150–400)
RBC: 2.84 MIL/uL — ABNORMAL LOW (ref 3.87–5.11)
RDW: 16.4 % — ABNORMAL HIGH (ref 11.5–15.5)
WBC: 7.6 10*3/uL (ref 4.0–10.5)

## 2017-04-05 LAB — HEMOGLOBIN A1C
Hgb A1c MFr Bld: 5.8 % — ABNORMAL HIGH (ref 4.8–5.6)
MEAN PLASMA GLUCOSE: 120 mg/dL

## 2017-04-05 LAB — URINE CULTURE

## 2017-04-05 MED ORDER — INSULIN ASPART 100 UNIT/ML ~~LOC~~ SOLN
0.0000 [IU] | Freq: Three times a day (TID) | SUBCUTANEOUS | Status: DC
  Administered 2017-04-05: 3 [IU] via SUBCUTANEOUS
  Administered 2017-04-06 – 2017-04-07 (×4): 2 [IU] via SUBCUTANEOUS
  Administered 2017-04-07 – 2017-04-08 (×3): 1 [IU] via SUBCUTANEOUS

## 2017-04-05 MED ORDER — INSULIN ASPART 100 UNIT/ML ~~LOC~~ SOLN: 0.0000 [IU] | Freq: Three times a day (TID) | SUBCUTANEOUS | Status: DC

## 2017-04-05 MED ORDER — INSULIN GLARGINE 100 UNIT/ML ~~LOC~~ SOLN
10.0000 [IU] | Freq: Every day | SUBCUTANEOUS | Status: DC
  Administered 2017-04-05 – 2017-04-07 (×3): 10 [IU] via SUBCUTANEOUS
  Filled 2017-04-05 (×4): qty 0.1

## 2017-04-05 MED ORDER — FUROSEMIDE 10 MG/ML IJ SOLN
60.0000 mg | Freq: Two times a day (BID) | INTRAMUSCULAR | Status: DC
  Administered 2017-04-05 – 2017-04-06 (×2): 60 mg via INTRAVENOUS
  Filled 2017-04-05 (×2): qty 6

## 2017-04-05 NOTE — Progress Notes (Signed)
PROGRESS NOTE    [name]  SMO:[id number] DOB: Dec 14, 1940 DOA: 04/03/2017 PCP: Burnard Bunting, MD  Brief Narrative:[name] is a 77 y.o. female with history of chronic kidney disease stage IV, chronic diastolic CHF, hypertension, diabetes mellitus, COPD, sleep apnea, renal cell carcinoma status post ablation, thrombocytopenia presented to the ER after patient was found to be hypoglycemic. Patient also stated that over the last 24 hours patient has become increasingly short of breath with frequent productive cough  Assessment & Plan:   Principal Problem:   Acute respiratory failure with hypoxia ([place]) -due to acute on chronic diastolic CHF/Mod to severe MR, mod TR and CKD4 -off BIPAP -clinically improving, continue IV lasix cut down dose to 60mg  Q12 -Admits compliance with diuretics however unable to tell me what her home dose is -Monitor urine output and weights -PT/OT/Ambulate  Hypoglycemia/DM 2 -given history of type 2 diabetes with stage IV kidney disease, stopped Glucotrol , -Sliding-scale insulin for now, slowly resume lantus at lower dose, reportedly on humalog mix once a day -will ask DM coordinator to see ` Acute on chronic diastolic CHF -diuresis as above  CKD4 -baseline creatinine in the 2.5-3 range -Creatinine better than recent baseline likely secondary to volume overload -Monitor with diuresis  COPD -Stable, no wheezing, nebs PRN  Sleep apnea -CPAP QHS  Hypertensive urgency -Hypertensive urgency likely secondary to volume overloaded state -improved, stop amlodipine  DVT prophylaxis:heparin subcutaneous Code Status: full code Family Communication:no family at bedside Disposition Plan: tele, home in 1-2days pending adequate diuresis  Consultants:    Procedures:   Antimicrobials:    Subjective: -breathing better  Objective: Vitals:   04/05/17 0730 04/05/17 0735 04/05/17 0800 04/05/17 1211  BP: (!) 120/50   (!) 143/61  Pulse: 65   97    Resp: 17   18  Temp: 97.6 F (36.4 C)   98 F (36.7 C)  TempSrc: Oral   Oral  SpO2: 95% 100% 99% 99%  Weight:      Height:       No intake or output data in the 24 hours ending 04/05/17 1238 Filed Weights   04/03/17 1527 04/04/17 2040  Weight: 102.1 kg (225 lb) 101.1 kg (222 lb 14.2 oz)    Examination:  Gen: obese, chronically ill female, laying in bed HEENT: obese neck unable to assess JVD Lungs: basilar crackles CVS: RRR,No Gallops,Rubs or new Murmurs Abd: soft, Non tender, non distended, BS present Extremities: 1+ edema Skin: chronic venous change stasis changes and hyperpigmentation in both lower legs Psychiatry: Judgement and insight appear normal. Mood & affect appropriate.     Data Reviewed:   CBC: Recent Labs  Lab 04/03/17 1542 04/04/17 0204 04/05/17 0226  WBC 5.6 9.1 7.6  NEUTROABS 3.9  --   --   HGB 9.2* 9.5* 8.4*  HCT 27.9* 28.6* 26.0*  MCV 89.7 88.8 91.5  PLT 146* 167 920   Basic Metabolic Panel: Recent Labs  Lab 04/03/17 1542 04/04/17 0204 04/05/17 0226  NA 144 142 143  K 4.3 4.9 5.1  CL 117* 113* 112*  CO2 20* 17* 20*  GLUCOSE 67 160* 203*  BUN 52* 53* 70*  CREATININE 2.09* 2.14* 2.53*  CALCIUM 8.7* 8.7* 8.2*   GFR: Estimated Creatinine Clearance: 22.6 mL/min (A) (by C-G formula based on SCr of 2.53 mg/dL (H)). Liver Function Tests: Recent Labs  Lab 04/03/17 1542  AST 32  ALT 43  ALKPHOS 112  BILITOT 0.6  PROT 7.0  ALBUMIN 3.0*  Recent Labs  Lab 04/03/17 1542  LIPASE 21   No results for input(s): AMMONIA in the last 168 hours. Coagulation Profile: Recent Labs  Lab 04/03/17 1542  INR 1.20   Cardiac Enzymes: Recent Labs  Lab 04/04/17 0204 04/04/17 0843  TROPONINI 0.04* 0.05*   BNP (last 3 results) No results for input(s): PROBNP in the last 8760 hours. HbA1C: Recent Labs    04/04/17 0204  HGBA1C 5.8*   CBG: Recent Labs  Lab 04/04/17 1235 04/05/17 0127 04/05/17 0405 04/05/17 0734 04/05/17 1213   GLUCAP 189* 227* 180* 152* 235*   Lipid Profile: No results for input(s): CHOL, HDL, LDLCALC, TRIG, CHOLHDL, LDLDIRECT in the last 72 hours. Thyroid Function Tests: Recent Labs    04/04/17 0204  TSH 1.434   Anemia Panel: No results for input(s): VITAMINB12, FOLATE, FERRITIN, TIBC, IRON, RETICCTPCT in the last 72 hours. Urine analysis:    Component Value Date/Time   COLORURINE YELLOW 04/03/2017 [place] 04/03/2017 1518   LABSPEC 1.011 04/03/2017 1518   PHURINE 5.0 04/03/2017 1518   GLUCOSEU NEGATIVE 04/03/2017 1518   HGBUR NEGATIVE 04/03/2017 1518   BILIRUBINUR NEGATIVE 04/03/2017 1518   KETONESUR NEGATIVE 04/03/2017 1518   PROTEINUR 30 (A) 04/03/2017 1518   UROBILINOGEN 0.2 12/16/2011 1717   NITRITE NEGATIVE 04/03/2017 1518   LEUKOCYTESUR NEGATIVE 04/03/2017 1518   Sepsis Labs: @LABRCNTIP (procalcitonin:4,lacticidven:4)  ) Recent Results (from the past 240 hour(s))  Urine culture     Status: Abnormal   Collection Time: 04/03/17 11:07 PM  Result Value Ref Range Status   Specimen Description URINE, CLEAN CATCH  Final   Special Requests   Final    NONE Performed at [facility], [place] [address] [place], [place] [address]    Culture MULTIPLE SPECIES PRESENT, SUGGEST RECOLLECTION (A)  Final   Report Status 04/05/2017 FINAL  Final  MRSA PCR Screening     Status: None   Collection Time: 04/04/17  7:01 PM  Result Value Ref Range Status   MRSA by PCR NEGATIVE NEGATIVE Final    Comment:        The GeneXpert MRSA Assay (FDA approved for NASAL specimens only), is one component of a comprehensive MRSA colonization surveillance program. It is not intended to diagnose MRSA infection nor to guide or monitor treatment for MRSA infections. Performed at [facility], [employer] [address], [place], [place] [address]          Radiology Studies: Dg Chest Port 1 View  Result Date: 04/03/2017 CLINICAL DATA:  Hypoglycemia.  Weakness. EXAM: PORTABLE  CHEST 1 VIEW COMPARISON:  11/30/2016 FINDINGS: Stable enlargement of the cardiopericardial silhouette. No mediastinal or hilar masses. There is vascular congestion, without overt pulmonary edema. No lung consolidation to suggest pneumonia. No convincing pleural effusion and no pneumothorax. Skeletal structures are grossly intact. IMPRESSION: 1. No acute cardiopulmonary disease. 2. Stable cardiomegaly. Electronically Signed   By: Lajean Manes M.D.   On: 04/03/2017 16:50        Scheduled Meds: . budesonide  0.25 mg Nebulization BID  . calcitRIOL  0.25 mcg Oral Q M,W,F  . calcium-vitamin D  1 tablet Oral Daily  . fluticasone  2 spray Each Nare Daily  . folic acid  295 mcg Oral Daily  . furosemide  60 mg Intravenous Q12H  . heparin  5,000 Units Subcutaneous Q8H  . ipratropium-albuterol  3 mL Nebulization TID  . iron polysaccharides  150 mg Oral BID  . magnesium oxide  400 mg Oral Daily  . montelukast  10 mg Oral QHS  . nitroGLYCERIN  1 inch Topical Q6H  . omega-3 acid ethyl esters  1 g Oral Daily  . pantoprazole  40 mg Oral Daily  . polyvinyl alcohol  1 drop Both Eyes BID  . simvastatin  20 mg Oral q1800  . vitamin B-12  1,000 mcg Oral Daily   Continuous Infusions:   LOS: 1 day    Time spent: 76min    Domenic Polite, MD [facility] Page via [email or web address], password [email or web address] After 7PM please contact night-coverage  04/05/2017, 12:38 PM

## 2017-04-05 NOTE — Care Management Note (Signed)
Case Management Note  Patient Details  Name: BYRD TERRERO MRN: [id number] Date of Birth: April 05, 1940  Subjective/Objective:       Pt admitted with acute resp failure             Action/Plan:  PTA independent from home with daughter.  Pt has walker in the home, supplemental oxygen 3 liters at night only and CPAP machine (both CPAP and oxygen supplied by [employer])  Pt voiced concern with CPAP machine - CM relayed concern with [place] - liaison to follow back up with CM   Expected Discharge Date:                  Expected Discharge Plan:  Home/Self Care  In-House Referral:     Discharge planning Services  CM Consult  Post Acute Care Choice:    Choice offered to:     DME Arranged:    DME Agency:     HH Arranged:    HH Agency:     Status of Service:     If discussed at [employer], dates discussed:    Additional Comments:  Maryclare Labrador, RN 04/05/2017, 2:52 PM

## 2017-04-05 NOTE — Progress Notes (Signed)
Inpatient Diabetes Program Recommendations  AACE/ADA: New Consensus Statement on Inpatient Glycemic Control (2015)  Target Ranges:  Prepandial:   less than 140 mg/dL      Peak postprandial:   less than 180 mg/dL (1-2 hours)      Critically ill patients:  140 - 180 mg/dL   Lab Results  Component Value Date   GLUCAP 235 (H) 04/05/2017   HGBA1C 5.8 (H) 04/04/2017    Review of Glycemic Control Results for [name], [name] (MRN [id number]) as of 04/05/2017 15:16  Ref. Range 04/04/2017 12:35 04/05/2017 01:27 04/05/2017 04:05 04/05/2017 07:34 04/05/2017 12:13  Glucose-Capillary Latest Ref Range: 65 - 99 mg/dL 189 (H) 227 (H) 180 (H) 152 (H) 235 (H)   Diabetes history: DM2 Outpatient Diabetes medications: Lantus 30 units q hs + Novolog insulin mix 75/25 15 units q am + Glipizide 10 mg bid Current orders for Inpatient glycemic control: Lantus 10 units q hs  Inpatient Diabetes Program Recommendations:   Received consult to review home insulin regimen. Patient is uncertain what she is paying for insulin on a regular basis and shared that the price varies.  A1c is very low @ 5.8 and suspect patient may have some lows in the morning hrs. Consider: D/C Glipizide D/C Lantus Adjust Humalog Kwikpen 75/25 mix to 15 units ac breakfast & 10 units ac supper  Thank you, Nani Gasser. Phi Avans, RN, MSN, CDE  Diabetes Coordinator Inpatient Glycemic Control Team Team Pager [phone] (8am-5pm) 04/05/2017 3:20 PM

## 2017-04-06 LAB — CBC
HCT: 28.2 % — ABNORMAL LOW (ref 36.0–46.0)
Hemoglobin: 8.9 g/dL — ABNORMAL LOW (ref 12.0–15.0)
MCH: 29.1 pg (ref 26.0–34.0)
MCHC: 31.6 g/dL (ref 30.0–36.0)
MCV: 92.2 fL (ref 78.0–100.0)
PLATELETS: 174 10*3/uL (ref 150–400)
RBC: 3.06 MIL/uL — ABNORMAL LOW (ref 3.87–5.11)
RDW: 16.5 % — AB (ref 11.5–15.5)
WBC: 5.9 10*3/uL (ref 4.0–10.5)

## 2017-04-06 LAB — GLUCOSE, CAPILLARY
GLUCOSE-CAPILLARY: 163 mg/dL — AB (ref 65–99)
GLUCOSE-CAPILLARY: 172 mg/dL — AB (ref 65–99)
GLUCOSE-CAPILLARY: 188 mg/dL — AB (ref 65–99)
Glucose-Capillary: 151 mg/dL — ABNORMAL HIGH (ref 65–99)
Glucose-Capillary: 186 mg/dL — ABNORMAL HIGH (ref 65–99)
Glucose-Capillary: 143 mg/dL — ABNORMAL HIGH (ref 65–99)

## 2017-04-06 LAB — BASIC METABOLIC PANEL
ANION GAP: 9 (ref 5–15)
BUN: 77 mg/dL — ABNORMAL HIGH (ref 6–20)
CALCIUM: 8.4 mg/dL — AB (ref 8.9–10.3)
CO2: 22 mmol/L (ref 22–32)
CREATININE: 2.76 mg/dL — AB (ref 0.44–1.00)
Chloride: 110 mmol/L (ref 101–111)
GFR calc Af Amer: 18 mL/min — ABNORMAL LOW (ref >60)
GFR, EST NON AFRICAN AMERICAN: 16 mL/min — AB (ref >60)
GLUCOSE: 166 mg/dL — AB (ref 65–99)
Potassium: 5 mmol/L (ref 3.5–5.1)
Sodium: 141 mmol/L (ref 135–145)

## 2017-04-06 MED ORDER — FUROSEMIDE 40 MG PO TABS
40.0000 mg | ORAL_TABLET | Freq: Two times a day (BID) | ORAL | Status: DC
  Administered 2017-04-06 – 2017-04-07 (×2): 40 mg via ORAL
  Filled 2017-04-06 (×2): qty 1

## 2017-04-06 NOTE — Clinical Social Work Note (Signed)
Clinical Social Work Assessment  Patient Details  Name: [name] MRN: [id number] Date of Birth: 1940-06-22  Date of referral:  04/06/17               Reason for consult:  Facility Placement                Permission sought to share information with:  Facility Art therapist granted to share information::  Yes, Verbal Permission Granted  Name::        Agency::  SNF  Relationship::     Contact Information:     Housing/Transportation Living arrangements for the past 2 months:  Single Family Home Source of Information:  Patient Patient Interpreter Needed:  None Criminal Activity/Legal Involvement Pertinent to Current Situation/Hospitalization:  No - Comment as needed Significant Relationships:  Friend Lives with:  Self Do you feel safe going back to the place where you live?  Yes Need for family participation in patient care:  Yes (Comment)(needs help from aids)  Care giving concerns:  Lives at home alone- states she has family in the area but they work.  States someone comes in everyday to help and has aids that assist with bathing.   Social Worker assessment / plan:  CSW spoke with pt concerning PT recommendation for SNF.  Pt has been in the past and is familiar with process.  Employment status:  Retired Nurse, adult PT Recommendations:  [place] / Referral to community resources:  [place]  Patient/Family's Response to care:  CSW initially very against SNF placement but after further conversation is agreeable to [facility] faxing out and considering- was at SNF previously and had good therapy experience but did not like RN care.  Patient/Family's Understanding of and Emotional Response to Diagnosis, Current Treatment, and Prognosis:  No questions or concerns at this time- hopeful she will be well enough to return home.  Emotional Assessment Appearance:  Appears stated  age Attitude/Demeanor/Rapport:    Affect (typically observed):  Appropriate, Flat Orientation:  Oriented to Self, Oriented to Place, Oriented to  Time, Oriented to Situation Alcohol / Substance use:  Not Applicable Psych involvement (Current and /or in the community):  No (Comment)  Discharge Needs  Concerns to be addressed:  Care Coordination Readmission within the last 30 days:  No Current discharge risk:  Physical Impairment Barriers to Discharge:  Continued Medical Work up   Jorge Ny, LCSW 04/06/2017, 3:03 PM

## 2017-04-06 NOTE — Evaluation (Signed)
Physical Therapy Evaluation Patient Details Name: [name] MRN: [id number] DOB: 1940-01-28 Today's Date: 04/06/2017   History of Present Illness  Pt is a 77 y/o female admitted secondary to acute respiratory failure with hypoxia likely multifactorial including acute on chronic diastolic CHF with hypertensive urgency. PMH including but not limited to COPD, DM and HTN.    Clinical Impression  Pt presented supine in bed with HOB elevated, awake and willing to participate in therapy session. Prior to admission, pt reported that she ambulated with use of rollator and could feed herself, dress herself and bathe herself as long someone assists her into the bathtub. Pt currently very limited secondary to fatigue and weakness. Pt required mod A with use of RW to perform sit<>stand from recliner chair. Pt only able to take a very small step backwards towards the chair, but was very fatigued and could not participate in further gait training. Pt would continue to benefit from skilled physical therapy services at this time while admitted and after d/c to address the below listed limitations in order to improve overall safety and independence with functional mobility.     Follow Up Recommendations SNF;Supervision/Assistance - 24 hour    Equipment Recommendations  None recommended by PT    Recommendations for Other Services       Precautions / Restrictions Precautions Precautions: Fall Restrictions Weight Bearing Restrictions: No      Mobility  Bed Mobility               General bed mobility comments: pt OOB in recliner chair upon arrival  Transfers Overall transfer level: Needs assistance Equipment used: Rolling walker (2 wheeled) Transfers: Sit to/from Stand Sit to Stand: Mod assist         General transfer comment: increased time and effort, use of momentum, good technique, mod A to power into standing  Ambulation/Gait             General Gait Details: pt only able  to take a very small step backwards to be closer to the recliner chair; pt very fatigued after transfer into standing  Stairs            Wheelchair Mobility    Modified Rankin (Stroke Patients Only)       Balance Overall balance assessment: Needs assistance Sitting-balance support: Feet supported Sitting balance-Leahy Scale: Fair     Standing balance support: During functional activity;Bilateral upper extremity supported Standing balance-Leahy Scale: Poor                               Pertinent Vitals/Pain Pain Assessment: No/denies pain    Home Living Family/patient expects to be discharged to:: Private residence Living Arrangements: Alone Available Help at Discharge: Family;Friend(s);Available PRN/intermittently Type of Home: House Home Access: Stairs to enter Entrance Stairs-Rails: None Entrance Stairs-Number of Steps: 1 Home Layout: One level Home Equipment: Tub bench;Walker - 4 wheels Additional Comments: Has home O2 that she wears only at night (2-3 L)    Prior Function Level of Independence: Needs assistance   Gait / Transfers Assistance Needed: uses rollator all the time  ADL's / Homemaking Assistance Needed: can bath herself if someone assists her into the tub, can dress and feed herself        Hand Dominance        Extremity/Trunk Assessment   Upper Extremity Assessment Upper Extremity Assessment: Defer to OT evaluation    Lower Extremity Assessment Lower  Extremity Assessment: Generalized weakness(edema noted throughout, significant out-toeing/ER of LEs)    Cervical / Trunk Assessment Cervical / Trunk Assessment: Kyphotic  Communication   Communication: No difficulties  Cognition Arousal/Alertness: Awake/alert Behavior During Therapy: WFL for tasks assessed/performed Overall Cognitive Status: Within Functional Limits for tasks assessed                                        General Comments       Exercises     Assessment/Plan    PT Assessment Patient needs continued PT services  PT Problem List Decreased strength;Decreased activity tolerance;Decreased balance;Decreased mobility;Decreased coordination;Cardiopulmonary status limiting activity       PT Treatment Interventions DME instruction;Gait training;Functional mobility training;Therapeutic activities;Therapeutic exercise;Balance training;Stair training;Neuromuscular re-education;Patient/family education    PT Goals (Current goals can be found in the Care Plan section)  Acute Rehab PT Goals Patient Stated Goal: return home PT Goal Formulation: With patient Time For Goal Achievement: 04/20/17 Potential to Achieve Goals: Fair    Frequency Min 2X/week   Barriers to discharge Decreased caregiver support      Co-evaluation               AM-PAC PT "6 Clicks" Daily Activity  Outcome Measure Difficulty turning over in bed (including adjusting bedclothes, sheets and blankets)?: Unable Difficulty moving from lying on back to sitting on the side of the bed? : Unable Difficulty sitting down on and standing up from a chair with arms (e.g., wheelchair, bedside commode, etc,.)?: Unable Help needed moving to and from a bed to chair (including a wheelchair)?: A Lot Help needed walking in hospital room?: A Lot Help needed climbing 3-5 steps with a railing? : Total 6 Click Score: 8    End of Session Equipment Utilized During Treatment: Gait belt Activity Tolerance: Patient limited by fatigue Patient left: in chair;with call bell/phone within reach Nurse Communication: Mobility status PT Visit Diagnosis: Other abnormalities of gait and mobility (R26.89);Muscle weakness (generalized) (M62.81)    Time: 4481-8563 PT Time Calculation (min) (ACUTE ONLY): 15 min   Charges:   PT Evaluation $PT Eval Moderate Complexity: 1 Mod     PT G Codes:        [place], PT, DPT [place] 04/06/2017, 10:43  AM

## 2017-04-06 NOTE — Progress Notes (Signed)
PROGRESS NOTE    [name]  MWN:[id number] DOB: June 22, 1940 DOA: 04/03/2017 PCP: Burnard Bunting, MD  Brief Narrative:[name] is a 77 y.o. female with history of chronic kidney disease stage IV, chronic diastolic CHF, hypertension, diabetes mellitus, COPD, sleep apnea, renal cell carcinoma status post ablation, thrombocytopenia presented to the ER after patient was found to be hypoglycemic. Patient also stated that over the last 24 hours patient has become increasingly short of breath with frequent productive cough. -found to be volume overloaded secondary to acute on chronic diastolic CHF, required BiPAP on admission, clinically improving with diuresis  Assessment & Plan:   Principal Problem:   Acute respiratory failure with hypoxia (HCC) -due to acute on chronic diastolic CHF/Mod to severe MR, mod TR and CKD4 -off BIPAP -clinically improving, with diuresis, creatinine rising with diuresis still in baseline range though, change to PO lasix now -urine output not recorded accurately -Admits compliance with diuretics however unable to tell me what her home dose is -PT/OT/Ambulate today -somewhat debilitated, may need rehab  Hypoglycemia/DM 2 -given history of type 2 diabetes with stage IV kidney disease, stopped Glucotrol , takes lantus Qpm and Novolog 75/25 Qam -Sliding-scale insulin for now, resume lantus at lower dose, holding humalog qam -CBGs better, Hba1c is 5.8, which is too low for elderly pt with CKD 4, will use low dose Humalog mix with meals BID at DC ` Acute on chronic diastolic CHF -diuresis as above, change to PO lasix today  CKD4 -baseline creatinine in the 2.5-3 range -Creatinine better than recent baseline likely secondary to volume overload -2.7 with diuresis, still in baseline range -Bmet in am  COPD -Stable, no wheezing, nebs PRN  Sleep apnea -CPAP QHS  Hypertensive urgency -Hypertensive urgency likely secondary to volume overloaded  state -improved, stopped amlodipine  DVT prophylaxis:heparin subcutaneous Code Status: full code Family Communication:no family at bedside Disposition Plan: may need SNF, PT eval pending, may be ready for DC tomorrow Consultants:    Procedures:   Antimicrobials:    Subjective: -breathing better, used CPAP at night  Objective: Vitals:   04/06/17 0408 04/06/17 0756 04/06/17 0803 04/06/17 1238  BP: (!) 168/73  (!) 182/68 (!) 151/62  Pulse: 78  (!) 52 88  Resp: 20  (!) 22 20  Temp: 97.8 F (36.6 C)  98.9 F (37.2 C) 98.5 F (36.9 C)  TempSrc: Axillary  Axillary Axillary  SpO2: 96% 92% 96%   Weight: 98.2 kg (216 lb 7.9 oz)     Height:        Intake/Output Summary (Last 24 hours) at 04/06/2017 1316 Last data filed at 04/06/2017 4034 Gross per 24 hour  Intake 240 ml  Output 1900 ml  Net -1660 ml   Filed Weights   04/03/17 1527 04/04/17 2040 04/06/17 0408  Weight: 102.1 kg (225 lb) 101.1 kg (222 lb 14.2 oz) 98.2 kg (216 lb 7.9 oz)    Examination:  VQQ:VZDGLOV, chronically ill-appearing female, laying in bed, no distress HEENT: obese, unable to assess JVD Lungs: improved air movement, slightly decreased in the bases, rest clear CVS: S1-S2/regular rate rhythm Abd: soft, Non tender, non distended, BS present Extremities: 1+ edema Skin: chronic venous change stasis changes and hyperpigmentation in both lower legs Psychiatry: Judgement and insight appear normal. Mood & affect appropriate.     Data Reviewed:   CBC: Recent Labs  Lab 04/03/17 1542 04/04/17 0204 04/05/17 0226 04/06/17 0245  WBC 5.6 9.1 7.6 5.9  NEUTROABS 3.9  --   --   --  HGB 9.2* 9.5* 8.4* 8.9*  HCT 27.9* 28.6* 26.0* 28.2*  MCV 89.7 88.8 91.5 92.2  PLT 146* 167 174 326   Basic Metabolic Panel: Recent Labs  Lab 04/03/17 1542 04/04/17 0204 04/05/17 0226 04/06/17 0245  NA 144 142 143 141  K 4.3 4.9 5.1 5.0  CL 117* 113* 112* 110  CO2 20* 17* 20* 22  GLUCOSE 67 160* 203* 166*  BUN  52* 53* 70* 77*  CREATININE 2.09* 2.14* 2.53* 2.76*  CALCIUM 8.7* 8.7* 8.2* 8.4*   GFR: Estimated Creatinine Clearance: 20.4 mL/min (A) (by C-G formula based on SCr of 2.76 mg/dL (H)). Liver Function Tests: Recent Labs  Lab 04/03/17 1542  AST 32  ALT 43  ALKPHOS 112  BILITOT 0.6  PROT 7.0  ALBUMIN 3.0*   Recent Labs  Lab 04/03/17 1542  LIPASE 21   No results for input(s): AMMONIA in the last 168 hours. Coagulation Profile: Recent Labs  Lab 04/03/17 1542  INR 1.20   Cardiac Enzymes: Recent Labs  Lab 04/04/17 0204 04/04/17 0843  TROPONINI 0.04* 0.05*   BNP (last 3 results) No results for input(s): PROBNP in the last 8760 hours. HbA1C: Recent Labs    04/04/17 0204  HGBA1C 5.8*   CBG: Recent Labs  Lab 04/05/17 1946 04/05/17 2343 04/06/17 0404 04/06/17 0757 04/06/17 1152  GLUCAP 225* 163* 151* 172* 188*   Lipid Profile: No results for input(s): CHOL, HDL, LDLCALC, TRIG, CHOLHDL, LDLDIRECT in the last 72 hours. Thyroid Function Tests: Recent Labs    04/04/17 0204  TSH 1.434   Anemia Panel: No results for input(s): VITAMINB12, FOLATE, FERRITIN, TIBC, IRON, RETICCTPCT in the last 72 hours. Urine analysis:    Component Value Date/Time   COLORURINE YELLOW 04/03/2017 [place] 04/03/2017 1518   LABSPEC 1.011 04/03/2017 1518   PHURINE 5.0 04/03/2017 1518   GLUCOSEU NEGATIVE 04/03/2017 1518   HGBUR NEGATIVE 04/03/2017 1518   BILIRUBINUR NEGATIVE 04/03/2017 1518   KETONESUR NEGATIVE 04/03/2017 1518   PROTEINUR 30 (A) 04/03/2017 1518   UROBILINOGEN 0.2 12/16/2011 1717   NITRITE NEGATIVE 04/03/2017 1518   LEUKOCYTESUR NEGATIVE 04/03/2017 1518   Sepsis Labs: @LABRCNTIP (procalcitonin:4,lacticidven:4)  ) Recent Results (from the past 240 hour(s))  Urine culture     Status: Abnormal   Collection Time: 04/03/17 11:07 PM  Result Value Ref Range Status   Specimen Description URINE, CLEAN CATCH  Final   Special Requests   Final     NONE Performed at [facility], [place] [address] [place], [place] [address]    Culture MULTIPLE SPECIES PRESENT, SUGGEST RECOLLECTION (A)  Final   Report Status 04/05/2017 FINAL  Final  MRSA PCR Screening     Status: None   Collection Time: 04/04/17  7:01 PM  Result Value Ref Range Status   MRSA by PCR NEGATIVE NEGATIVE Final    Comment:        The GeneXpert MRSA Assay (FDA approved for NASAL specimens only), is one component of a comprehensive MRSA colonization surveillance program. It is not intended to diagnose MRSA infection nor to guide or monitor treatment for MRSA infections. Performed at [facility], [place] [address], [place],  [address]          Radiology Studies: No results found.      Scheduled Meds: . budesonide  0.25 mg Nebulization BID  . calcitRIOL  0.25 mcg Oral Q M,W,F  . calcium-vitamin D  1 tablet Oral Daily  . fluticasone  2 spray Each Nare Daily  . folic acid  754 mcg Oral Daily  . heparin  5,000 Units Subcutaneous Q8H  . insulin aspart  0-9 Units Subcutaneous TID WC  . insulin glargine  10 Units Subcutaneous QHS  . ipratropium-albuterol  3 mL Nebulization TID  . iron polysaccharides  150 mg Oral BID  . magnesium oxide  400 mg Oral Daily  . montelukast  10 mg Oral QHS  . omega-3 acid ethyl esters  1 g Oral Daily  . pantoprazole  40 mg Oral Daily  . polyvinyl alcohol  1 drop Both Eyes BID  . simvastatin  20 mg Oral q1800  . vitamin B-12  1,000 mcg Oral Daily   Continuous Infusions:   LOS: 2 days    Time spent: 61min    Domenic Polite, MD [facility] Page via [email or web address], password [email or web address] After 7PM please contact night-coverage  04/06/2017, 1:16 PM

## 2017-04-06 NOTE — NC FL2 (Signed)
MEDICAID FL2 LEVEL OF CARE SCREENING TOOL     IDENTIFICATION  Patient Name: [name] [facility] Birthdate: 11/29/1940 Sex: female Admission Date (Current Location): 04/03/2017  [facility] and [place] Number:  Herbalist and Address:  [facility], [place] [address] [place], [place] [address]      Provider Number: [id number]  Attending Physician Name and Address:  Domenic Polite, MD  Relative Name and Phone Number:       Current Level of Care: Hospital Recommended Level of Care: [place] Prior Approval Number:    Date Approved/Denied:   PASRR Number: [id number]  Discharge Plan: SNF    Current Diagnoses: Patient Active Problem List   Diagnosis Date Noted  . Acute respiratory failure with hypoxia ([place]) 04/04/2017  . Hypertensive urgency 04/04/2017  . CKD (chronic kidney disease) stage 4, GFR 15-29 ml/min (HCC) 04/04/2017  . Hypoglycemia   . Acute renal failure superimposed on stage 3 chronic kidney disease ([place]) 11/23/2016  . Metabolic acidosis with normal anion gap and bicarbonate losses 11/23/2016  . Acute exacerbation of CHF (congestive heart failure) ([place]) 11/22/2016  . Acute on chronic respiratory failure with hypoxia ([place]) 11/22/2016  . Midfoot ulcer, left, limited to breakdown of skin ([place]) 06/13/2016  . Idiopathic chronic venous hypertension of both lower extremities with inflammation 03/10/2016  . Onychomycosis 03/10/2016  . COPD exacerbation ([place]) 11/07/2013  . Upper airway cough syndrome 11/07/2013  . Insomnia 12/10/2012  . OSA (obstructive sleep apnea) 09/17/2012  . Benign neoplasm of colon 08/14/2012  . Rectal bleeding 08/13/2012    Class: Acute  . Anemia 08/13/2012    Class: Acute  . Osteoarthritis 08/13/2012    Class: Chronic  . Acute posthemorrhagic anemia 08/13/2012  . Diverticulosis of colon (without mention of hemorrhage) 08/13/2012  . Hoarseness 07/20/2011  . COPD with asthma ([place]) 04/20/2011   . Allergic rhinitis 04/20/2011  . GI bleed 02/08/2011  . Personal history of colonic polyps 04/16/2010  . Fecal incontinence 03/23/2010  . Change in bowel habits 03/23/2010    Orientation RESPIRATION BLADDER Height & Weight     Self, Situation, Place, Time  O2, Other (Comment)( 2L; CPAP at night) External catheter(catheter placed 04/03/17) Weight: 216 lb 7.9 oz (98.2 kg) Height:  5\' 5"  (165.1 cm)  BEHAVIORAL SYMPTOMS/MOOD NEUROLOGICAL BOWEL NUTRITION STATUS      Continent Diet(carb modified; renal, fluid restriction at 1200)  AMBULATORY STATUS COMMUNICATION OF NEEDS Skin   Limited Assist Verbally Normal                       Personal Care Assistance Level of Assistance  Bathing, Feeding, Dressing Bathing Assistance: Limited assistance Feeding assistance: Limited assistance Dressing Assistance: Limited assistance     Functional Limitations Info  Sight, Hearing, Speech Sight Info: Adequate Hearing Info: Adequate Speech Info: Adequate    SPECIAL CARE FACTORS FREQUENCY  PT (By licensed PT), OT (By licensed OT)     PT Frequency: 5x/wk OT Frequency: 5x/wk            Contractures Contractures Info: Not present    Additional Factors Info  Code Status, Allergies, Insulin Sliding Scale Code Status Info: Full Allergies Info: Other: "all generics make her sick"   Insulin Sliding Scale Info: 0-9 units 3x/day with meals; Lantus 10 units daily at bedtime       Current Medications (04/06/2017):  This is the current hospital active medication list Current Facility-Administered Medications  Medication Dose  Route Frequency Provider Last Rate Last Dose  . acetaminophen (TYLENOL) tablet 650 mg  650 mg Oral Q6H PRN Rise Patience, MD       Or  . acetaminophen (TYLENOL) suppository 650 mg  650 mg Rectal Q6H PRN Rise Patience, MD      . albuterol (PROVENTIL) (2.5 MG/3ML) 0.083% nebulizer solution 2.5 mg  2.5 mg Nebulization Q2H PRN Rise Patience, MD       . budesonide (PULMICORT) nebulizer solution 0.25 mg  0.25 mg Nebulization BID Rise Patience, MD   0.25 mg at 04/06/17 0756  . calcitRIOL (ROCALTROL) capsule 0.25 mcg  0.25 mcg Oral Q M,W,F Rise Patience, MD   0.25 mcg at 04/05/17 1610  . calcium-vitamin D (OSCAL WITH D) 500-200 MG-UNIT per tablet 1 tablet  1 tablet Oral Daily Domenic Polite, MD   1 tablet at 04/06/17 1050  . cyclobenzaprine (FLEXERIL) tablet 10 mg  10 mg Oral TID PRN Rise Patience, MD      . fluticasone [facility]) 50 MCG/ACT nasal spray 2 spray  2 spray Each Nare Daily Rise Patience, MD   2 spray at 04/06/17 1050  . folic acid (FOLVITE) tablet 0.5 mg  500 mcg Oral Daily Rise Patience, MD   0.5 mg at 04/06/17 1050  . heparin injection 5,000 Units  5,000 Units Subcutaneous Q8H Rise Patience, MD   5,000 Units at 04/06/17 [phone]  . hydrALAZINE (APRESOLINE) injection 10 mg  10 mg Intravenous Q4H PRN Rise Patience, MD   10 mg at 04/06/17 0844  . insulin aspart (novoLOG) injection 0-9 Units  0-9 Units Subcutaneous TID WC Domenic Polite, MD   2 Units at 04/06/17 [phone]  . insulin glargine (LANTUS) injection 10 Units  10 Units Subcutaneous QHS Domenic Polite, MD   10 Units at 04/05/17 2151  . ipratropium-albuterol (DUONEB) 0.5-2.5 (3) MG/3ML nebulizer solution 3 mL  3 mL Nebulization TID Rise Patience, MD   3 mL at 04/06/17 0756  . iron polysaccharides (NIFEREX) capsule 150 mg  150 mg Oral BID Rise Patience, MD   150 mg at 04/06/17 1050  . magnesium oxide (MAG-OX) tablet 400 mg  400 mg Oral Daily Rise Patience, MD   400 mg at 04/06/17 1050  . montelukast (SINGULAIR) tablet 10 mg  10 mg Oral QHS Rise Patience, MD   10 mg at 04/05/17 2151  . omega-3 acid ethyl esters (LOVAZA) capsule 1 g  1 g Oral Daily Rise Patience, MD   1 g at 04/06/17 1050  . ondansetron (ZOFRAN) tablet 4 mg  4 mg Oral Q6H PRN Rise Patience, MD   4 mg at 04/06/17 0844   Or  .  ondansetron (ZOFRAN) injection 4 mg  4 mg Intravenous Q6H PRN Rise Patience, MD      . pantoprazole (PROTONIX) EC tablet 40 mg  40 mg Oral Daily Rise Patience, MD   40 mg at 04/06/17 1050  . polyvinyl alcohol (LIQUIFILM TEARS) 1.4 % ophthalmic solution 1 drop  1 drop Both Eyes BID Rise Patience, MD   1 drop at 04/06/17 1052  . simvastatin (ZOCOR) tablet 20 mg  20 mg Oral q1800 Rise Patience, MD   20 mg at 04/05/17 1718  . traMADol (ULTRAM) tablet 50 mg  50 mg Oral Q6H PRN Rise Patience, MD      . traZODone (DESYREL) tablet 50 mg  50 mg Oral  QHS PRN Rise Patience, MD      . vitamin B-12 (CYANOCOBALAMIN) tablet 1,000 mcg  1,000 mcg Oral Daily Rise Patience, MD   1,000 mcg at 04/06/17 1050     Discharge Medications: Please see discharge summary for a list of discharge medications.  Relevant Imaging Results:  Relevant Lab Results:   Additional Information SS#: [id number]  Geralynn Ochs, LCSW

## 2017-04-06 NOTE — Progress Notes (Signed)
PT refused CPAP.  

## 2017-04-06 NOTE — Evaluation (Signed)
Occupational Therapy Evaluation Patient Details Name: [name] MRN: [id number] DOB: 1940/09/09 Today's Date: 04/06/2017    History of Present Illness Pt is a 77 y/o female admitted secondary to acute respiratory failure with hypoxia likely multifactorial including acute on chronic diastolic CHF with hypertensive urgency. PMH including but not limited to COPD, DM and HTN.   Clinical Impression   Pt reports ambulating with a rollator, sponge bathing, dressing, toileting and performing meal prep independently. Pt was assisted for errands and showering. Pt presents with significant weakness and moves very slowly. She demonstrates poor standing balance and decreased endurance. Pt requires set up to total assist for ADL. She lives alone and will need post acute rehab in SNF prior to return home. Will follow acutely.    Follow Up Recommendations  SNF;Supervision/Assistance - 24 hour    Equipment Recommendations       Recommendations for Other Services       Precautions / Restrictions Precautions Precautions: Fall Restrictions Weight Bearing Restrictions: No      Mobility Bed Mobility               General bed mobility comments: pt OOB in recliner chair upon arrival  Transfers Overall transfer level: Needs assistance Equipment used: Rolling walker (2 wheeled) Transfers: Sit to/from [facility] Sit to Stand: Mod assist Stand pivot transfers: Mod assist       General transfer comment: increased time and effort, use of momentum, good technique, mod A to power into standing    Balance Overall balance assessment: Needs assistance Sitting-balance support: Feet supported Sitting balance-Leahy Scale: Fair     Standing balance support: During functional activity;Bilateral upper extremity supported Standing balance-Leahy Scale: Poor                             ADL either performed or assessed with clinical judgement   ADL Overall ADL's :  Needs assistance/impaired Eating/Feeding: Independent;Sitting   Grooming: Wash/dry hands;Wash/dry face;Sitting;Set up   Upper Body Bathing: Minimal assistance;Sitting   Lower Body Bathing: Total assistance;Sit to/from stand   Upper Body Dressing : Minimal assistance;Sitting   Lower Body Dressing: Total assistance;Sit to/from stand   Toilet Transfer: Moderate assistance;Stand-pivot;RW   Toileting- Clothing Manipulation and Hygiene: Total assistance;Sit to/from stand       Functional mobility during ADLs: (did not attempt ambulation)       Vision Baseline Vision/History: Wears glasses Wears Glasses: Reading only Patient Visual Report: No change from baseline       Perception     Praxis      Pertinent Vitals/Pain Pain Assessment: No/denies pain     Hand Dominance Right   Extremity/Trunk Assessment Upper Extremity Assessment Upper Extremity Assessment: RUE deficits/detail;LUE deficits/detail RUE Deficits / Details: 4/5 LUE Deficits / Details: 3+/5 shoulder, 4/5 elbow to hand, pt reports longstanding shoulder limitations   Lower Extremity Assessment Lower Extremity Assessment: Defer to PT evaluation   Cervical / Trunk Assessment Cervical / Trunk Assessment: Kyphotic   Communication Communication Communication: No difficulties   Cognition Arousal/Alertness: Awake/alert Behavior During Therapy: WFL for tasks assessed/performed Overall Cognitive Status: Within Functional Limits for tasks assessed                                 General Comments: no visitors to confirm PLOF   General Comments       Exercises  Shoulder Instructions      Home Living Family/patient expects to be discharged to:: Private residence Living Arrangements: Alone Available Help at Discharge: Family;Friend(s);Available PRN/intermittently Type of Home: House Home Access: Stairs to enter Entrance Stairs-Number of Steps: 1 Entrance Stairs-Rails: None Home Layout:  One level     Bathroom Shower/Tub: Teacher, early years/pre: Standard     Home Equipment: Tub bench;Walker - 4 wheels;Grab bars - toilet   Additional Comments: Has home O2 that she wears only at night (2-3 L), reports she has someone to grocery shop for her and another that helps her with showering twice a week      Prior Functioning/Environment Level of Independence: Needs assistance  Gait / Transfers Assistance Needed: uses rollator all the time ADL's / Homemaking Assistance Needed: can bath herself if someone assists her into the tub, can dress and feed herself            OT Problem List: Decreased strength;Decreased activity tolerance;Impaired balance (sitting and/or standing);Decreased knowledge of use of DME or AE;Obesity      OT Treatment/Interventions: Self-care/ADL training;DME and/or AE instruction;Patient/family education;Balance training;Therapeutic activities    OT Goals(Current goals can be found in the care plan section) Acute Rehab OT Goals Patient Stated Goal: return home OT Goal Formulation: With patient Time For Goal Achievement: 04/20/17 Potential to Achieve Goals: Good ADL Goals Pt Will Perform Upper Body Bathing: with min guard assist;sitting Pt Will Perform Lower Body Bathing: with min assist;with adaptive equipment;sit to/from stand Pt Will Perform Upper Body Dressing: with supervision;sitting;with set-up Pt Will Perform Lower Body Dressing: with min assist;sit to/from stand;with adaptive equipment Pt Will Transfer to Toilet: with min assist;ambulating;bedside commode Pt Will Perform Toileting - Clothing Manipulation and hygiene: with min assist;sit to/from stand Pt/caregiver will Perform Home Exercise Program: Both right and left upper extremity;With theraband;With Supervision  OT Frequency: Min 2X/week   Barriers to D/C: Decreased caregiver support          Co-evaluation              AM-PAC PT "6 Clicks" Daily Activity      Outcome Measure Help from another person eating meals?: None Help from another person taking care of personal grooming?: A Little Help from another person toileting, which includes using toliet, bedpan, or urinal?: A Lot Help from another person bathing (including washing, rinsing, drying)?: A Lot Help from another person to put on and taking off regular upper body clothing?: A Little Help from another person to put on and taking off regular lower body clothing?: Total 6 Click Score: 15   End of Session Equipment Utilized During Treatment: Gait belt;Rolling walker;Oxygen(2L)  Activity Tolerance: Patient tolerated treatment well Patient left: in chair;with call bell/phone within reach;with nursing/sitter in room  OT Visit Diagnosis: Unsteadiness on feet (R26.81);Muscle weakness (generalized) (M62.81)                Time: 5102-5852 OT Time Calculation (min): 17 min Charges:  OT General Charges $OT Visit: 1 Visit OT Evaluation $OT Eval Moderate Complexity: 1 Mod G-Codes:     03-May-2017 Nestor Lewandowsky, OTR/L Pager: [phone]  Trinnity Breunig, Haze Boyden May 03, 2017, 2:09 PM

## 2017-04-07 LAB — CBC
HCT: 27 % — ABNORMAL LOW (ref 36.0–46.0)
Hemoglobin: 8.6 g/dL — ABNORMAL LOW (ref 12.0–15.0)
MCH: 29.3 pg (ref 26.0–34.0)
MCHC: 31.9 g/dL (ref 30.0–36.0)
MCV: 91.8 fL (ref 78.0–100.0)
PLATELETS: 184 10*3/uL (ref 150–400)
RBC: 2.94 MIL/uL — ABNORMAL LOW (ref 3.87–5.11)
RDW: 16.1 % — ABNORMAL HIGH (ref 11.5–15.5)
WBC: 5.4 10*3/uL (ref 4.0–10.5)

## 2017-04-07 LAB — GLUCOSE, CAPILLARY
GLUCOSE-CAPILLARY: 109 mg/dL — AB (ref 65–99)
GLUCOSE-CAPILLARY: 145 mg/dL — AB (ref 65–99)
GLUCOSE-CAPILLARY: 148 mg/dL — AB (ref 65–99)
GLUCOSE-CAPILLARY: 91 mg/dL (ref 65–99)
Glucose-Capillary: 156 mg/dL — ABNORMAL HIGH (ref 65–99)
Glucose-Capillary: 211 mg/dL — ABNORMAL HIGH (ref 65–99)

## 2017-04-07 LAB — BASIC METABOLIC PANEL
Anion gap: 10 (ref 5–15)
BUN: 82 mg/dL — ABNORMAL HIGH (ref 6–20)
CALCIUM: 8.4 mg/dL — AB (ref 8.9–10.3)
CO2: 24 mmol/L (ref 22–32)
CREATININE: 2.72 mg/dL — AB (ref 0.44–1.00)
Chloride: 108 mmol/L (ref 101–111)
GFR, EST AFRICAN AMERICAN: 19 mL/min — AB (ref >60)
GFR, EST NON AFRICAN AMERICAN: 16 mL/min — AB (ref >60)
Glucose, Bld: 125 mg/dL — ABNORMAL HIGH (ref 65–99)
Potassium: 4.8 mmol/L (ref 3.5–5.1)
SODIUM: 142 mmol/L (ref 135–145)

## 2017-04-07 MED ORDER — FUROSEMIDE 40 MG PO TABS
40.0000 mg | ORAL_TABLET | Freq: Every day | ORAL | Status: DC
  Administered 2017-04-08: 40 mg via ORAL
  Filled 2017-04-07: qty 1

## 2017-04-07 NOTE — Progress Notes (Signed)
PROGRESS NOTE    [name]  CVE:[id number] DOB: Mar 16, 1940 DOA: 04/03/2017 PCP: Burnard Bunting, MD  Brief Narrative: [name] is a 77 y.o. female with history of chronic kidney disease stage IV, chronic diastolic CHF, hypertension, diabetes mellitus, COPD, sleep apnea, renal cell carcinoma status post ablation, thrombocytopenia presented to the ER after patient was found to be hypoglycemic. Patient also stated that over the last 24 hours patient has become increasingly short of breath with frequent productive cough. -found to be volume overloaded secondary to acute on chronic diastolic CHF, required BiPAP on admission, clinically improving with diuresis  Assessment & Plan:   Principal Problem:  #Acute respiratory failure with hypoxia (HCC) -due to acute on chronic diastolic CHF/Mod to severe MR, mod TR and CKD4. Patient has been off BIPAP. Significant improvement noted with diuresis. Patient was transitioned to po lasix and had good urine output -2600 ml with a net -1640 ml. Down -3.4L since admission. Lung exam without crackles mild wheezing noted. Patient is of [place] and maintaining  good oxygen saturation on room air. PT/OTrecommended SNF --Continue to monitor respiratory status --Follow up on SNF placement  #Hypoglycemia in the setting of T2DM Patient is on lantus, Humalog and glucotrol. A1c is 5.8. She presented with hypoglycemia with a BG 65. Patient T2DM is aggressively manage and should have medication regimen re optimize. --Continue lantus and SSI --Discontinue Glucotrol, will not resume given age and hypoglycemia on admission. Goal A1c low 7.0. --Follow up with PCP  #Acute on chronic diastolic CHF Patient with good UOP overnight on po lasix. -2.6L. Weight is down to 214 from 216. --Continue lasix 40 mg bid. Patient appears to be on torsemide 5 bid at home. Adjust as needed --Continue to monitor volume status and respiratory status  #CKD4 -baseline creatinine in the  2.5-3 range. Creatinine has improved to 2.72 within range despite diuresis.  -Follow up on BMP in the am  #COPD Mild wheezing noted this morning on exam. No increase work of breathing or SOB. Patient taken off [place] and maintaining O2 sat in the 90's.  --Albuterol Neb q2 prn  --Pulmicort bid   #Sleep apnea -CPAP QHS  #Hypertensive urgency, resolved On admission, hypertensive urgency likely secondary to volume overloaded. -Improved, stopped amlodipine  DVT prophylaxis: heparin subcutaneous Code Status: full code Family Communication:no family at bedside Disposition Plan: SNF placement pending  Consultants:    Procedures:   Antimicrobials:    Subjective: Refused CPAP overnight. Breathing much improved. Off [place]. No other acute complaint. Tolerating po.  Objective: Vitals:   04/07/17 0009 04/07/17 0142 04/07/17 0428 04/07/17 1205  BP: (!) 147/59  (!) 118/59 (!) 159/54  Pulse: 65  68 74  Resp: 20  20 (!) 22  Temp: 98.1 F (36.7 C)  98.5 F (36.9 C) 98.2 F (36.8 C)  TempSrc: Oral  Oral Oral  SpO2: 95%  98% 94%  Weight:  214 lb 1.1 oz (97.1 kg)    Height:        Intake/Output Summary (Last 24 hours) at 04/07/2017 1332 Last data filed at 04/07/2017 1038 Gross per 24 hour  Intake 360 ml  Output 1300 ml  Net -940 ml   Filed Weights   04/04/17 2040 04/06/17 0408 04/07/17 0142  Weight: 222 lb 14.2 oz (101.1 kg) 216 lb 7.9 oz (98.2 kg) 214 lb 1.1 oz (97.1 kg)    Examination:  WCH:ENIDPOE, chronically ill-appearing female, laying in bed, no distress HEENT: obese, unable to assess JVD Lungs: improved  air movement, slightly decreased in the bases, mild wheezing CVS: S1-S2/regular rate rhythm Abd: soft, Non tender, non distended, BS present Extremities: 1+ edema Skin: chronic venous change stasis changes and hyperpigmentation in both lower legs Psychiatry: Judgement and insight appear normal. Mood & affect appropriate.    Data Reviewed:   CBC: Recent Labs  Lab  04/03/17 1542 04/04/17 0204 04/05/17 0226 04/06/17 0245 04/07/17 0341  WBC 5.6 9.1 7.6 5.9 5.4  NEUTROABS 3.9  --   --   --   --   HGB 9.2* 9.5* 8.4* 8.9* 8.6*  HCT 27.9* 28.6* 26.0* 28.2* 27.0*  MCV 89.7 88.8 91.5 92.2 91.8  PLT 146* 167 174 174 161   Basic Metabolic Panel: Recent Labs  Lab 04/03/17 1542 04/04/17 0204 04/05/17 0226 04/06/17 0245 04/07/17 0341  NA 144 142 143 141 142  K 4.3 4.9 5.1 5.0 4.8  CL 117* 113* 112* 110 108  CO2 20* 17* 20* 22 24  GLUCOSE 67 160* 203* 166* 125*  BUN 52* 53* 70* 77* 82*  CREATININE 2.09* 2.14* 2.53* 2.76* 2.72*  CALCIUM 8.7* 8.7* 8.2* 8.4* 8.4*   GFR: Estimated Creatinine Clearance: 20.6 mL/min (A) (by C-G formula based on SCr of 2.72 mg/dL (H)). Liver Function Tests: Recent Labs  Lab 04/03/17 1542  AST 32  ALT 43  ALKPHOS 112  BILITOT 0.6  PROT 7.0  ALBUMIN 3.0*   Recent Labs  Lab 04/03/17 1542  LIPASE 21   No results for input(s): AMMONIA in the last 168 hours. Coagulation Profile: Recent Labs  Lab 04/03/17 1542  INR 1.20   Cardiac Enzymes: Recent Labs  Lab 04/04/17 0204 04/04/17 0843  TROPONINI 0.04* 0.05*   BNP (last 3 results) No results for input(s): PROBNP in the last 8760 hours. HbA1C: No results for input(s): HGBA1C in the last 72 hours. CBG: Recent Labs  Lab 04/06/17 2016 04/07/17 0008 04/07/17 0427 04/07/17 0801 04/07/17 1158  GLUCAP 143* 148* 109* 91 156*   Lipid Profile: No results for input(s): CHOL, HDL, LDLCALC, TRIG, CHOLHDL, LDLDIRECT in the last 72 hours. Thyroid Function Tests: No results for input(s): TSH, T4TOTAL, FREET4, T3FREE, THYROIDAB in the last 72 hours. Anemia Panel: No results for input(s): VITAMINB12, FOLATE, FERRITIN, TIBC, IRON, RETICCTPCT in the last 72 hours. Urine analysis:    Component Value Date/Time   COLORURINE YELLOW 04/03/2017 [place] 04/03/2017 1518   LABSPEC 1.011 04/03/2017 1518   PHURINE 5.0 04/03/2017 1518   GLUCOSEU  NEGATIVE 04/03/2017 1518   HGBUR NEGATIVE 04/03/2017 1518   BILIRUBINUR NEGATIVE 04/03/2017 1518   KETONESUR NEGATIVE 04/03/2017 1518   PROTEINUR 30 (A) 04/03/2017 1518   UROBILINOGEN 0.2 12/16/2011 1717   NITRITE NEGATIVE 04/03/2017 1518   LEUKOCYTESUR NEGATIVE 04/03/2017 1518   Sepsis Labs: @LABRCNTIP (procalcitonin:4,lacticidven:4)  ) Recent Results (from the past 240 hour(s))  Urine culture     Status: Abnormal   Collection Time: 04/03/17 11:07 PM  Result Value Ref Range Status   Specimen Description URINE, CLEAN CATCH  Final   Special Requests   Final    NONE Performed at [facility], [place] [address] [place], [place] [address]    Culture MULTIPLE SPECIES PRESENT, SUGGEST RECOLLECTION (A)  Final   Report Status 04/05/2017 FINAL  Final  MRSA PCR Screening     Status: None   Collection Time: 04/04/17  7:01 PM  Result Value Ref Range Status   MRSA by PCR NEGATIVE NEGATIVE Final    Comment:  The GeneXpert MRSA Assay (FDA approved for NASAL specimens only), is one component of a comprehensive MRSA colonization surveillance program. It is not intended to diagnose MRSA infection nor to guide or monitor treatment for MRSA infections. Performed at [facility], [place] [address] [place], [place] [address]          Radiology Studies: No results found.      Scheduled Meds: . budesonide  0.25 mg Nebulization BID  . calcitRIOL  0.25 mcg Oral Q M,W,F  . calcium-vitamin D  1 tablet Oral Daily  . fluticasone  2 spray Each Nare Daily  . folic acid  704 mcg Oral Daily  . furosemide  40 mg Oral BID  . heparin  5,000 Units Subcutaneous Q8H  . insulin aspart  0-9 Units Subcutaneous TID WC  . insulin glargine  10 Units Subcutaneous QHS  . ipratropium-albuterol  3 mL Nebulization TID  . iron polysaccharides  150 mg Oral BID  . magnesium oxide  400 mg Oral Daily  . montelukast  10 mg Oral QHS  . omega-3 acid ethyl esters  1 g Oral Daily  . pantoprazole   40 mg Oral Daily  . polyvinyl alcohol  1 drop Both Eyes BID  . simvastatin  20 mg Oral q1800  . vitamin B-12  1,000 mcg Oral Daily   Continuous Infusions:   LOS: 3 days    Time spent: 29min   [facility] Page via [email or web address], password [email or web address] After 7PM please contact night-coverage  04/07/2017, 1:32 PM

## 2017-04-07 NOTE — Plan of Care (Signed)
Patient is progressing in care plan goals 

## 2017-04-07 NOTE — Care Management Important Message (Signed)
Important Message  Patient Details  Name: [name] MRN: [id number] Date of Birth: [birth year]   Medicare Important Message Given:  Yes    Orbie Pyo 04/07/2017, 10:35 AM

## 2017-04-07 NOTE — Progress Notes (Signed)
Patient is on 2 lplm nasal canula.  No sign sor symptoms of  Respiratory distress.  Patient denies pain.  Safety and comfort measures maintained.  Call bell within reach.

## 2017-04-08 LAB — GLUCOSE, CAPILLARY
GLUCOSE-CAPILLARY: 158 mg/dL — AB (ref 65–99)
Glucose-Capillary: 126 mg/dL — ABNORMAL HIGH (ref 65–99)
Glucose-Capillary: 130 mg/dL — ABNORMAL HIGH (ref 65–99)
Glucose-Capillary: 203 mg/dL — ABNORMAL HIGH (ref 65–99)

## 2017-04-08 MED ORDER — LORAZEPAM 1 MG PO TABS: 0.5000 mg | ORAL_TABLET | ORAL | 0 refills | Status: DC

## 2017-04-08 MED ORDER — AMLODIPINE BESYLATE 10 MG PO TABS
10.0000 mg | ORAL_TABLET | Freq: Every day | ORAL | Status: DC
  Administered 2017-04-08: 10 mg via ORAL
  Filled 2017-04-08: qty 1

## 2017-04-08 MED ORDER — CLONIDINE HCL 0.1 MG PO TABS
0.1000 mg | ORAL_TABLET | Freq: Once | ORAL | Status: AC
  Administered 2017-04-08: 0.1 mg via ORAL
  Filled 2017-04-08: qty 1

## 2017-04-08 MED ORDER — INSULIN GLARGINE 100 UNIT/ML ~~LOC~~ SOLN: 15.0000 [IU] | Freq: Every day | SUBCUTANEOUS | 11 refills | Status: DC

## 2017-04-08 MED ORDER — TRAMADOL HCL 50 MG PO TABS: 50.0000 mg | ORAL_TABLET | Freq: Four times a day (QID) | ORAL | 0 refills | Status: DC | PRN

## 2017-04-08 NOTE — Progress Notes (Signed)
04/08/2017 patient had DTI on sacrum and a stage 2. The area is red and measure  2.5 to 1.5 on the left sacrum. Foam dressing was applied. [facility] RN.

## 2017-04-08 NOTE — Plan of Care (Signed)
Patient progressing in care plan goals.   

## 2017-04-08 NOTE — Discharge Summary (Signed)
Physician Discharge Summary  Kaytlynn Kochan [facility] LEX:[id number] DOB: 11/13/1940 DOA: 04/03/2017  PCP: Burnard Bunting, MD  Admit date: 04/03/2017 Discharge date: 04/08/2017  Admitted From: home Disposition:  SNF  Recommendations for Outpatient Follow-up:  1. Follow up with PCP in 1-2 weeks 2. Please monitor daily weights, follow a low-salt diet  Home Health: none Equipment/Devices: none  Discharge Condition: stable CODE STATUS: Full code Diet recommendation: low salt, heart healthy, diabetic   HPI: Per Dr. Glyn Ade, Dyneshia Baccam Noecker is a 77 y.o. female with history of chronic kidney disease stage IV, chronic diastolic CHF, hypertension, diabetes mellitus, COPD, sleep apnea, renal cell carcinoma status post ablation, thrombocytopenia presents to the ER after patient was found to be hypoglycemic.  Patient states over the last few days patient has become increasingly hypoglycemic and her home health aide who was visiting her today advised to come to the ER.  Patient also states that over the last 24 hours patient has become increasingly short of breath with frequent productive cough.  Denies any fever chills or chest pain.  Hospital Course: Acute respiratory failure with hypoxia (HCC) -due to acute on chronic diastolic CHF/Mod to severe MR, mod TR and CKD4. Patient has been off BIPAP. Significant improvement noted with diuresis.  Suspect dietary indiscretion at home, she is to return back to her home [employer], would recommend daily weights as well as a low-salt diet.  Her weight is improved from 225 on admission to 214 on discharge, she was able to be weaned off of oxygen, and will be discharged to SNF for ongoing rehab needs. Hypoglycemia in the setting of T2DM - Patient is on lantus, Humalog and glucotrol. A1c is 5.8.  I will decrease her Lantus dose in half as well as stop Glucotrol on discharge.  Continue to monitor CBGs Acute on chronic diastolic CHF -with improvement in her clinical status  with diuresis, resume home agents, ensure a low-salt diet as well as regular weight checks CKD4 -baseline creatinine in the 2.5-3 range.  At baseline on discharge, remained stable with diuresis COPD -stable, no wheezing Sleep apnea -CPAP QHS HTN -resume home medications, she states that at baseline she is in the 449Q systolic and "has not seen a normal value in a long time".  Resume Norvasc, continue torsemide, continue to uptitrate medications as clinically indicated   Discharge Diagnoses:  Principal Problem:   Acute respiratory failure with hypoxia ([place]) Active Problems:   COPD with asthma (HCC)   OSA (obstructive sleep apnea)   Chronic obstructive pulmonary disease (HCC)   Hypertensive urgency   CKD (chronic kidney disease) stage 4, GFR 15-29 ml/min (HCC)  Discharge Instructions  Allergies as of 04/08/2017      Reactions   Other Other (See Comments)   "all generics make her sick"      Medication List    STOP taking these medications   glipiZIDE 10 MG 24 hr tablet Commonly known as:  GLUCOTROL XL     TAKE these medications   amLODipine 5 MG tablet Commonly known as:  NORVASC Take 10 mg by mouth daily.   augmented betamethasone dipropionate 0.05 % cream Commonly known as:  DIPROLENE-AF Apply 1 application topically 2 (two) times daily. To both legs   budesonide 0.25 MG/2ML nebulizer solution Commonly known as:  PULMICORT Take 2 mLs (0.25 mg total) by nebulization 2 (two) times daily.   calcitRIOL 0.25 MCG capsule Commonly known as:  ROCALTROL Take 1 capsule (0.25 mcg total) by mouth every Monday,  Wednesday, and Friday.   CALTRATE 600+D 600-400 MG-UNIT tablet Generic drug:  Calcium Carbonate-Vitamin D Take 1 tablet by mouth daily.   cyclobenzaprine 10 MG tablet Commonly known as:  FLEXERIL Take 10 mg 3 (three) times daily as needed by mouth for muscle spasms.   Fish Oil 1000 MG Caps Take 1 capsule by mouth daily.   fluticasone 50 MCG/ACT nasal  spray Commonly known as:  FLONASE Place 2 sprays into both nostrils daily.   folic acid 149 MCG tablet Commonly known as:  FOLVITE Take 400 mcg by mouth daily.   HUMALOG MIX 75/25 KWIKPEN (75-25) 100 UNIT/ML Kwikpen Generic drug:  Insulin Lispro Prot & Lispro Inject 15 Units into the skin daily.   insulin glargine 100 UNIT/ML injection Commonly known as:  LANTUS Inject 0.15 mLs (15 Units total) into the skin at bedtime. What changed:  how much to take   ipratropium-albuterol 0.5-2.5 (3) MG/3ML Soln Commonly known as:  DUONEB Take 3 mLs by nebulization every 6 (six) hours as needed. What changed:  reasons to take this   iron polysaccharides 150 MG capsule Commonly known as:  NIFEREX Take 150 mg by mouth 2 (two) times daily.   LORazepam 1 MG tablet Commonly known as:  ATIVAN Take 0.5-1 tablets (0.5-1 mg total) by mouth See admin instructions. Take 0.5 tablet every morning and 1 tablet in the evening as needed for sleep   magnesium oxide 400 MG tablet Commonly known as:  MAG-OX Take 400 mg by mouth daily.   methocarbamol 500 MG tablet Commonly known as:  ROBAXIN Take 500 mg 3 (three) times daily as needed by mouth for muscle spasms.   montelukast 10 MG tablet Commonly known as:  SINGULAIR Take 1 tablet (10 mg total) by mouth at bedtime.   NEXIUM 40 MG capsule Generic drug:  esomeprazole Take 1 capsule in morning by mouth   potassium chloride SA 20 MEQ tablet Commonly known as:  K-DUR,KLOR-CON Take 20 mEq by mouth daily.   prochlorperazine 5 MG tablet Commonly known as:  COMPAZINE Take 5 mg by mouth every 6 (six) hours as needed for nausea.   pyridOXINE 100 MG tablet Commonly known as:  VITAMIN B-6 Take 100 mg by mouth daily.   SYSTANE BALANCE 0.6 % Soln Generic drug:  Propylene Glycol Apply 1 drop to eye 2 (two) times daily.   torsemide 10 MG tablet Commonly known as:  DEMADEX Take 5 tablets (50 mg total) by mouth 2 (two) times daily.   traMADol 50 MG  tablet Commonly known as:  ULTRAM Take 1 tablet (50 mg total) by mouth every 6 (six) hours as needed for severe pain.   traZODone 50 MG tablet Commonly known as:  DESYREL Take 50 mg at bedtime as needed by mouth for sleep.   vitamin B-12 1000 MCG tablet Commonly known as:  CYANOCOBALAMIN Take 1,000 mcg by mouth daily.   Vitamin D (Ergocalciferol) 50000 units Caps capsule Commonly known as:  DRISDOL Take 50,000 Units by mouth 2 (two) times a week.   vitamin E 400 UNIT capsule Take 400 Units by mouth daily.   ZOCOR 20 MG tablet Generic drug:  simvastatin Take 20 mg by mouth daily.        Consultations:  None   Procedures/Studies:  Dg Chest Port 1 View  Result Date: 04/03/2017 CLINICAL DATA:  Hypoglycemia.  Weakness. EXAM: PORTABLE CHEST 1 VIEW COMPARISON:  11/30/2016 FINDINGS: Stable enlargement of the cardiopericardial silhouette. No mediastinal or hilar masses. There is  vascular congestion, without overt pulmonary edema. No lung consolidation to suggest pneumonia. No convincing pleural effusion and no pneumothorax. Skeletal structures are grossly intact. IMPRESSION: 1. No acute cardiopulmonary disease. 2. Stable cardiomegaly. Electronically Signed   By: Lajean Manes M.D.   On: 04/03/2017 16:50     Subjective: - no chest pain, shortness of breath, no abdominal pain, nausea or vomiting.   Discharge Exam: Vitals:   04/08/17 0801 04/08/17 0802  BP:    Pulse:    Resp:    Temp:    SpO2: 93% 93%    General: Pt is alert, awake, not in acute distress Cardiovascular: RRR, S1/S2 +, no rubs, no gallops Respiratory: CTA bilaterally, no wheezing, no rhonchi Abdominal: Soft, NT, ND, bowel sounds + Extremities: no edema, no cyanosis    The results of significant diagnostics from this hospitalization (including imaging, microbiology, ancillary and laboratory) are listed below for reference.     Microbiology: Recent Results (from the past 240 hour(s))  Urine culture      Status: Abnormal   Collection Time: 04/03/17 11:07 PM  Result Value Ref Range Status   Specimen Description URINE, CLEAN CATCH  Final   Special Requests   Final    NONE Performed at [facility], [address] [place], [place] [address]    Culture MULTIPLE SPECIES PRESENT, SUGGEST RECOLLECTION (A)  Final   Report Status 04/05/2017 FINAL  Final  MRSA PCR Screening     Status: None   Collection Time: 04/04/17  7:01 PM  Result Value Ref Range Status   MRSA by PCR NEGATIVE NEGATIVE Final    Comment:        The GeneXpert MRSA Assay (FDA approved for NASAL specimens only), is one component of a comprehensive MRSA colonization surveillance program. It is not intended to diagnose MRSA infection nor to guide or monitor treatment for MRSA infections. Performed at [facility], [place] [address], [place], [place] [address]      Labs: BNP (last 3 results) Recent Labs    11/22/16 0405 04/03/17 2137  BNP 74.2 967.5*   Basic Metabolic Panel: Recent Labs  Lab 04/03/17 1542 04/04/17 0204 04/05/17 0226 04/06/17 0245 04/07/17 0341  NA 144 142 143 141 142  K 4.3 4.9 5.1 5.0 4.8  CL 117* 113* 112* 110 108  CO2 20* 17* 20* 22 24  GLUCOSE 67 160* 203* 166* 125*  BUN 52* 53* 70* 77* 82*  CREATININE 2.09* 2.14* 2.53* 2.76* 2.72*  CALCIUM 8.7* 8.7* 8.2* 8.4* 8.4*   Liver Function Tests: Recent Labs  Lab 04/03/17 1542  AST 32  ALT 43  ALKPHOS 112  BILITOT 0.6  PROT 7.0  ALBUMIN 3.0*   Recent Labs  Lab 04/03/17 1542  LIPASE 21   No results for input(s): AMMONIA in the last 168 hours. CBC: Recent Labs  Lab 04/03/17 1542 04/04/17 0204 04/05/17 0226 04/06/17 0245 04/07/17 0341  WBC 5.6 9.1 7.6 5.9 5.4  NEUTROABS 3.9  --   --   --   --   HGB 9.2* 9.5* 8.4* 8.9* 8.6*  HCT 27.9* 28.6* 26.0* 28.2* 27.0*  MCV 89.7 88.8 91.5 92.2 91.8  PLT 146* 167 174 174 184   Cardiac Enzymes: Recent Labs  Lab 04/04/17 0204 04/04/17 0843  TROPONINI 0.04* 0.05*    BNP: Invalid input(s): POCBNP CBG: Recent Labs  Lab 04/07/17 1603 04/07/17 2018 04/07/17 2336 04/08/17 0412 04/08/17 0853  GLUCAP 145* 211* 203* 158* 130*  D-Dimer No results for input(s): DDIMER in the last 72 hours. Hgb A1c No results for input(s): HGBA1C in the last 72 hours. Lipid Profile No results for input(s): CHOL, HDL, LDLCALC, TRIG, CHOLHDL, LDLDIRECT in the last 72 hours. Thyroid function studies No results for input(s): TSH, T4TOTAL, T3FREE, THYROIDAB in the last 72 hours.  Invalid input(s): FREET3 Anemia work up No results for input(s): VITAMINB12, FOLATE, FERRITIN, TIBC, IRON, RETICCTPCT in the last 72 hours. Urinalysis    Component Value Date/Time   COLORURINE YELLOW 04/03/2017 1518   APPEARANCEUR CLEAR 04/03/2017 1518   LABSPEC 1.011 04/03/2017 1518   PHURINE 5.0 04/03/2017 1518   GLUCOSEU NEGATIVE 04/03/2017 1518   HGBUR NEGATIVE 04/03/2017 1518   BILIRUBINUR NEGATIVE 04/03/2017 1518   KETONESUR NEGATIVE 04/03/2017 1518   PROTEINUR 30 (A) 04/03/2017 1518   UROBILINOGEN 0.2 12/16/2011 1717   NITRITE NEGATIVE 04/03/2017 1518   LEUKOCYTESUR NEGATIVE 04/03/2017 1518   Sepsis Labs Invalid input(s): PROCALCITONIN,  WBC,  LACTICIDVEN   Time coordinating discharge: 45 minutes  SIGNED:  Marzetta Board, MD  [facility] 04/08/2017, 10:04 AM Pager [phone]  If 7PM-7AM, please contact night-coverage [email or web address] Password [email or web address]

## 2017-04-08 NOTE — Clinical Social Work Placement (Addendum)
   CLINICAL SOCIAL WORK PLACEMENT  NOTE  [facility]  RN to call report to [phone]  Date:  04/08/2017  Patient Details  Name: [name] MRN: [id number] Date of Birth: 06-07-1940  Clinical Social Work is seeking post-discharge placement for this patient at the [place] level of care (*CSW will initial, date and re-position this form in  chart as items are completed):  Yes   Patient/family provided with [facility] Work Department's list of facilities offering this level of care within the geographic area requested by the patient (or if unable, by the patient's family).  Yes   Patient/family informed of their freedom to choose among providers that offer the needed level of care, that participate in Medicare, Medicaid or managed care program needed by the patient, have an available bed and are willing to accept the patient.  Yes   Patient/family informed of [facility]'s ownership interest in [facility] and [facility], as well as of the fact that they are under no obligation to receive care at these facilities.  PASRR submitted to EDS on       PASRR number received on       Existing PASRR number confirmed on 04/06/17     FL2 transmitted to all facilities in geographic area requested by pt/family on 04/06/17     FL2 transmitted to all facilities within larger geographic area on       Patient informed that his/her managed care company has contracts with or will negotiate with certain facilities, including the following:        Yes   Patient/family informed of bed offers received.  Patient [facility] bed at [facility]     Physician recommends and patient chooses bed at      Patient to be transferred to [facility] on 04/08/17.  Patient to be transferred to facility by [facility]     Patient family notified on 04/08/17 of transfer.  Name of family member notified:    sisters (x2)    [phone]  PHYSICIAN Please sign FL2     Additional Comment:    _______________________________________________ Alexander Mt, [place] 04/08/2017, 11:16 AM

## 2017-04-08 NOTE — Social Work (Signed)
Clinical Social Worker facilitated patient discharge including contacting patient family and facility to confirm patient discharge plans.  Clinical information faxed to facility and family agreeable with plan.  CSW arranged ambulance transport via [facility] to [facility] room 217 RN to call [phone] with report prior to discharge.  Clinical Social Worker will sign off for now as social work intervention is no longer needed. Please consult [place] again if new need arises.  Alexander Mt, [place] Social Worker [phone]

## 2017-04-20 ENCOUNTER — Other Ambulatory Visit (HOSPITAL_COMMUNITY): Payer: Self-pay

## 2017-04-21 ENCOUNTER — Encounter (HOSPITAL_COMMUNITY): Payer: Medicare Other

## 2017-05-19 ENCOUNTER — Encounter (HOSPITAL_COMMUNITY): Payer: Medicare Other

## 2017-05-22 ENCOUNTER — Other Ambulatory Visit: Payer: Self-pay | Admitting: Internal Medicine

## 2017-05-22 DIAGNOSIS — Z1231 Encounter for screening mammogram for malignant neoplasm of breast: Secondary | ICD-10-CM

## 2017-06-12 ENCOUNTER — Ambulatory Visit
Admission: RE | Admit: 2017-06-12 | Discharge: 2017-06-12 | Disposition: A | Discharge status: Home / Self Care | Payer: Medicare Other | Source: Ambulatory Visit | Attending: Internal Medicine | Admitting: Internal Medicine

## 2017-06-12 DIAGNOSIS — Z1231 Encounter for screening mammogram for malignant neoplasm of breast: Secondary | ICD-10-CM

## 2017-06-14 ENCOUNTER — Ambulatory Visit: Payer: Medicare Other

## 2017-06-28 ENCOUNTER — Encounter (HOSPITAL_COMMUNITY)
Admission: RE | Admit: 2017-06-28 | Discharge: 2017-06-28 | Disposition: A | Discharge status: Home / Self Care | Payer: Medicare Other | Source: Ambulatory Visit | Attending: Nephrology | Admitting: Nephrology

## 2017-06-28 VITALS — BP 167/63 | HR 73 | Temp 97.7°F | Resp 20

## 2017-06-28 DIAGNOSIS — D631 Anemia in chronic kidney disease: Secondary | ICD-10-CM

## 2017-06-28 DIAGNOSIS — N184 Chronic kidney disease, stage 4 (severe): Secondary | ICD-10-CM | POA: Insufficient documentation

## 2017-06-28 LAB — POCT HEMOGLOBIN-HEMACUE: HEMOGLOBIN: 8.8 g/dL — AB (ref 12.0–15.0)

## 2017-06-28 LAB — IRON AND TIBC
IRON: 101 ug/dL (ref 28–170)
Saturation Ratios: 36 % — ABNORMAL HIGH (ref 10.4–31.8)
TIBC: 284 ug/dL (ref 250–450)
UIBC: 183 ug/dL

## 2017-06-28 LAB — FERRITIN: Ferritin: 113 ng/mL (ref 11–307)

## 2017-06-28 MED ORDER — DARBEPOETIN ALFA 200 MCG/0.4ML IJ SOSY
PREFILLED_SYRINGE | INTRAMUSCULAR | Status: AC
  Administered 2017-06-28: 200 ug via SUBCUTANEOUS
  Filled 2017-06-28: qty 0.4

## 2017-06-28 MED ORDER — DARBEPOETIN ALFA 200 MCG/0.4ML IJ SOSY
200.0000 ug | PREFILLED_SYRINGE | INTRAMUSCULAR | Status: DC
  Administered 2017-06-28: 200 ug via SUBCUTANEOUS

## 2017-06-30 ENCOUNTER — Ambulatory Visit: Payer: Medicare Other | Admitting: Podiatry

## 2017-06-30 DIAGNOSIS — E1151 Type 2 diabetes mellitus with diabetic peripheral angiopathy without gangrene: Secondary | ICD-10-CM

## 2017-06-30 DIAGNOSIS — Q828 Other specified congenital malformations of skin: Secondary | ICD-10-CM

## 2017-06-30 DIAGNOSIS — B351 Tinea unguium: Secondary | ICD-10-CM

## 2017-06-30 NOTE — Progress Notes (Signed)
  Subjective:  Patient ID: [name], female    DOB: 1940/08/20,  MRN: [id number]  Chief Complaint  Patient presents with  . Callouses    right foot callus   77 y.o. female returns for diabetic foot care.  Unknown last blood sugar.  Reports edema to both lower extremities reports painful callus to the right midfoot  Objective:   General AA&O x3. Normal mood and affect.  Vascular Dorsalis pedis pulses present 1+ bilaterally  Posterior tibial pulses absent bilaterally  Capillary refill normal to all digits. Pedal hair growth normal.  Neurologic Epicritic sensation present bilaterally. Protective sensation with 5.07 monofilament  present bilaterally. Vibratory sensation present bilaterally.  Dermatologic No open lesions. Interspaces clear of maceration.  Normal skin temperature and turgor. Hyperkeratotic lesions: Sub-navicular right Nails: brittle, onychomycosis, thickening, elongation  Orthopedic: No history of amputation. MMT 5/5 in dorsiflexion, plantarflexion, inversion, and eversion. Normal lower extremity joint ROM without pain or crepitus. Pes planus with externally rotated foot type bilateral     Assessment & Plan:  Patient was evaluated and treated and all questions answered.  Diabetes with PAD, Onychomycosis -Educated on diabetic footcare. Diabetic risk level 1 -At risk foot care provided as below.  Procedure: Nail Debridement Rationale: Patient meets criteria for routine foot care due to Class B findings. Type of Debridement: manual, sharp debridement. Instrumentation: Nail nipper, rotary burr. Number of Nails: 10  Procedure: Paring of Lesion Rationale: painful hyperkeratotic lesion Type of Debridement: manual, sharp debridement. Instrumentation: 312 blade Number of Lesions: 1   Return in about 3 months (around 09/30/2017) for Diabetic Foot Care.

## 2017-07-25 ENCOUNTER — Other Ambulatory Visit (HOSPITAL_COMMUNITY): Payer: Self-pay | Admitting: *Deleted

## 2017-07-26 ENCOUNTER — Ambulatory Visit (HOSPITAL_COMMUNITY)
Admission: RE | Admit: 2017-07-26 | Discharge: 2017-07-26 | Disposition: A | Discharge status: Home / Self Care | Payer: Medicare Other | Source: Ambulatory Visit | Attending: Nephrology | Admitting: Nephrology

## 2017-07-26 VITALS — BP 151/70 | HR 70 | Temp 97.7°F

## 2017-07-26 DIAGNOSIS — N184 Chronic kidney disease, stage 4 (severe): Secondary | ICD-10-CM | POA: Insufficient documentation

## 2017-07-26 DIAGNOSIS — D631 Anemia in chronic kidney disease: Secondary | ICD-10-CM | POA: Insufficient documentation

## 2017-07-26 LAB — IRON AND TIBC
Iron: 55 ug/dL (ref 28–170)
SATURATION RATIOS: 17 % (ref 10.4–31.8)
TIBC: 321 ug/dL (ref 250–450)
UIBC: 266 ug/dL

## 2017-07-26 LAB — FERRITIN: Ferritin: 86 ng/mL (ref 11–307)

## 2017-07-26 MED ORDER — DARBEPOETIN ALFA 200 MCG/0.4ML IJ SOSY
200.0000 ug | PREFILLED_SYRINGE | INTRAMUSCULAR | Status: DC
  Administered 2017-07-26: 200 ug via SUBCUTANEOUS

## 2017-07-26 MED ORDER — DARBEPOETIN ALFA 200 MCG/0.4ML IJ SOSY
PREFILLED_SYRINGE | INTRAMUSCULAR | Status: AC
  Filled 2017-07-26: qty 0.4

## 2017-07-27 LAB — POCT HEMOGLOBIN-HEMACUE: Hemoglobin: 10.3 g/dL — ABNORMAL LOW (ref 12.0–15.0)

## 2017-08-23 ENCOUNTER — Encounter (HOSPITAL_COMMUNITY): Payer: Medicare Other

## 2017-09-20 ENCOUNTER — Encounter (HOSPITAL_COMMUNITY): Payer: Medicare Other

## 2017-09-29 ENCOUNTER — Ambulatory Visit: Payer: Medicare Other | Admitting: Podiatry

## 2017-10-20 ENCOUNTER — Other Ambulatory Visit: Payer: Self-pay | Admitting: Pulmonary Disease

## 2017-10-20 ENCOUNTER — Ambulatory Visit: Payer: Medicare Other | Admitting: Podiatry

## 2017-10-20 ENCOUNTER — Telehealth: Payer: Self-pay | Admitting: *Deleted

## 2017-10-20 DIAGNOSIS — I890 Lymphedema, not elsewhere classified: Secondary | ICD-10-CM

## 2017-10-20 DIAGNOSIS — Q828 Other specified congenital malformations of skin: Secondary | ICD-10-CM

## 2017-10-20 DIAGNOSIS — I878 Other specified disorders of veins: Secondary | ICD-10-CM | POA: Diagnosis not present

## 2017-10-20 DIAGNOSIS — B351 Tinea unguium: Secondary | ICD-10-CM | POA: Diagnosis not present

## 2017-10-20 DIAGNOSIS — E1151 Type 2 diabetes mellitus with diabetic peripheral angiopathy without gangrene: Secondary | ICD-10-CM

## 2017-10-20 NOTE — Telephone Encounter (Addendum)
Dr. March Rummage ordered B/L unna boots weekly until lymphedema pumps arrive. Faxed required form, and demographics to [facility], will fax 10/20/2017 clinicals when available. Paperwork prepped for faxing to [facility] Lymphedema pumps once 10/20/2017 clinicals when available.

## 2017-10-27 ENCOUNTER — Telehealth: Payer: Self-pay | Admitting: *Deleted

## 2017-10-27 NOTE — Telephone Encounter (Signed)
Tiffany - [facility] states they are out of network with pt's insurance, have tried to cross refer to [facility], [facility], and [facility] is behind, may want to send referral to [facility].

## 2017-10-30 NOTE — Telephone Encounter (Signed)
Sherry - [facility] asked if the [place] boot should be zinc or calamine. I told her zinc and she states she will put in the orders and get pt started today.

## 2017-10-31 DIAGNOSIS — E1151 Type 2 diabetes mellitus with diabetic peripheral angiopathy without gangrene: Secondary | ICD-10-CM

## 2017-10-31 DIAGNOSIS — M1991 Primary osteoarthritis, unspecified site: Secondary | ICD-10-CM | POA: Diagnosis not present

## 2017-10-31 DIAGNOSIS — L97828 Non-pressure chronic ulcer of other part of left lower leg with other specified severity: Secondary | ICD-10-CM | POA: Diagnosis not present

## 2017-10-31 DIAGNOSIS — Z9181 History of falling: Secondary | ICD-10-CM | POA: Diagnosis not present

## 2017-10-31 NOTE — Telephone Encounter (Signed)
Faxed 10/20/2017 clinicals received 10/31/2017, to [facility].

## 2017-10-31 NOTE — Progress Notes (Addendum)
Subjective:  Patient ID: [name], female    DOB: Oct 06, 1940,  MRN: [id number]  No chief complaint on file.   77 y.o. female presents  for diabetic foot care. Last AMBS was unkown. Complains of swelling to both legs. Denies numbness and tingling in their feet. Denies cramping in legs and thighs.  Review of Systems: Negative except as noted in the HPI. Denies N/V/F/Ch.  Past Medical History:  Diagnosis Date  . Adenomatous colon polyp   . Anemia   . Arthritis   . Asthma   . COPD (chronic obstructive pulmonary disease) ([place])   . 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 Depression   . Diabetes mellitus   . Diverticulosis   . Gastric ulcer   . GERD (gastroesophageal reflux disease)   . HTN (hypertension)   . Hypercholesterolemia   . Internal hemorrhoids   . Lumbar disc disease   . On home oxygen therapy    "2-3L @ bedtime" (04/04/2017)  . OSA (obstructive sleep apnea) 09/17/2012  . Peptic ulcer disease   . 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 Umbilical hernia   . Valgus foot    right     Current Outpatient Medications:  .  amLODipine (NORVASC) 5 MG tablet, Take 10 mg by mouth daily. , Disp: , Rfl:  .  augmented betamethasone dipropionate (DIPROLENE-AF) 0.05 % cream, Apply 1 application topically 2 (two) times daily. To both legs, Disp: , Rfl:  .  budesonide (PULMICORT) 0.25 MG/2ML nebulizer solution, Take 2 mLs (0.25 mg total) by nebulization 2 (two) times daily., Disp: 60 mL, Rfl: 12 .  calcitRIOL (ROCALTROL) 0.25 MCG capsule, Take 1 capsule (0.25 mcg total) by mouth every Monday, Wednesday, and Friday., Disp: 30 capsule, Rfl: 0 .  Calcium Carbonate-Vitamin D (CALTRATE 600+D) 600-400 MG-UNIT per tablet, Take 1 tablet by mouth daily., Disp: , Rfl:  .  cyclobenzaprine (FLEXERIL) 10 MG tablet, Take 10 mg 3 (three) times daily as needed by mouth for muscle spasms., Disp: , Rfl:  .  fluticasone (FLONASE) 50 MCG/ACT nasal spray, Place 2 sprays into both nostrils daily.,  Disp: 16 g, Rfl: 2 .  folic acid (FOLVITE) 008 MCG tablet, Take 400 mcg by mouth daily., Disp: , Rfl:  .  HUMALOG MIX 75/25 KWIKPEN (75-25) 100 UNIT/ML SUSP, Inject 15 Units into the skin daily. , Disp: , Rfl:  .  insulin glargine (LANTUS) 100 UNIT/ML injection, Inject 0.15 mLs (15 Units total) into the skin at bedtime., Disp: 10 mL, Rfl: 11 .  ipratropium-albuterol (DUONEB) 0.5-2.5 (3) MG/3ML SOLN, Take 3 mLs by nebulization every 6 (six) hours as needed. (Patient taking differently: Take 3 mLs every 6 (six) hours as needed by nebulization (SOB). ), Disp: 360 mL, Rfl: 11 .  iron polysaccharides (NIFEREX) 150 MG capsule, Take 150 mg by mouth 2 (two) times daily., Disp: , Rfl:  .  LORazepam (ATIVAN) 1 MG tablet, Take 0.5-1 tablets (0.5-1 mg total) by mouth See admin instructions. Take 0.5 tablet every morning and 1 tablet in the evening as needed for sleep, Disp: 10 tablet, Rfl: 0 .  magnesium oxide (MAG-OX) 400 MG tablet, Take 400 mg by mouth daily., Disp: , Rfl:  .  methocarbamol (ROBAXIN) 500 MG tablet, Take 500 mg 3 (three) times daily as needed by mouth for muscle spasms. , Disp: , Rfl: 0 .  montelukast (SINGULAIR) 10 MG tablet, Take 1 tablet (10 mg total) by mouth at bedtime., Disp: 30 tablet, Rfl: 5 .  NEXIUM 40 MG capsule, Take 1 capsule in morning by mouth, Disp: , Rfl:  .  Omega-3 Fatty Acids (FISH OIL) 1000 MG CAPS, Take 1 capsule by mouth daily., Disp: , Rfl:  .  potassium chloride SA (K-DUR,KLOR-CON) 20 MEQ tablet, Take 20 mEq by mouth daily., Disp: , Rfl:  .  prochlorperazine (COMPAZINE) 5 MG tablet, Take 5 mg by mouth every 6 (six) hours as needed for nausea., Disp: , Rfl:  .  Propylene Glycol (SYSTANE BALANCE) 0.6 % SOLN, Apply 1 drop to eye 2 (two) times daily., Disp: , Rfl:  .  pyridOXINE (VITAMIN B-6) 100 MG tablet, Take 100 mg by mouth daily., Disp: , Rfl:  .  torsemide (DEMADEX) 10 MG tablet, Take 5 tablets (50 mg total) by mouth 2 (two) times daily., Disp: 300 tablet, Rfl: 0 .   traMADol (ULTRAM) 50 MG tablet, Take 1 tablet (50 mg total) by mouth every 6 (six) hours as needed for severe pain., Disp: 10 tablet, Rfl: 0 .  traZODone (DESYREL) 50 MG tablet, Take 50 mg at bedtime as needed by mouth for sleep., Disp: , Rfl:  .  vitamin B-12 (CYANOCOBALAMIN) 1000 MCG tablet, Take 1,000 mcg by mouth daily., Disp: , Rfl:  .  Vitamin D, Ergocalciferol, (DRISDOL) 50000 UNITS CAPS capsule, Take 50,000 Units by mouth 2 (two) times a week., Disp: , Rfl: 7 .  vitamin E 400 UNIT capsule, Take 400 Units by mouth daily., Disp: , Rfl:  .  ZOCOR 20 MG tablet, Take 20 mg by mouth daily., Disp: , Rfl: 5  Social History   Tobacco Use  Smoking Status Former Smoker  . Packs/day: 2.00  . Years: 5.00  . Pack years: 10.00  . Types: Cigarettes  . Last attempt to quit: 01/10/1958  . Years since quitting: 59.8  Smokeless Tobacco Never Used    Allergies  Allergen Reactions  . Other Other (See Comments)    "all generics make her sick"   Objective:  There were no vitals filed for this visit. There is no height or weight on file to calculate BMI. Constitutional Well developed. Well nourished.  Vascular Dorsalis pedis pulses present 1+ bilaterally  Posterior tibial pulses absent bilaterally  Pedal hair growth diminished. Capillary refill normal to all digits.  No cyanosis or clubbing noted.  Neurologic Normal speech. Oriented to person, place, and time. Epicritic sensation to light touch grossly present bilaterally. Protective sensation with 5.07 monofilament  present bilaterally. Vibratory sensation present bilaterally.  Dermatologic Nails elongated, thickened, dystrophic. No open wounds. Brawny pitting edema bilateral lower extremities. Right navicular HPK  Orthopedic: Normal joint ROM without pain or crepitus bilaterally. No visible deformities. No bony tenderness.   Edema Circumference: Date: 10/11 Left Right  Dorsum Foot 25 cm 25 cm  Ankle 26 cm 27 cm  Calf 46 cm 44 cm    Thigh 60 cm 58 cm   Treatment: Unna boots bilat  Assessment:   1. Diabetes mellitus type 2 with peripheral artery disease ([place])   2. Onychomycosis   3. Porokeratosis   4. Lymphedema   5. Venous stasis    Plan:  Patient was evaluated and treated and all questions answered.  Diabetes with PAD, Onychomycosis -Educated on diabetic footcare. Diabetic risk level 1 -Nails x10 debrided sharply and manually with large nail nipper and rotary burr.   Procedure: Nail Debridement Rationale: Patient meets criteria for routine foot care due to PAD Type of Debridement: manual, sharp debridement. Instrumentation: Nail nipper, rotary burr. Number  of Nails: 10  Procedure: Paring of Lesion Rationale: painful hyperkeratotic lesion Type of Debridement: manual, sharp debridement. Instrumentation: 312 blade Number of Lesions: 1    Lymphadema with Venous Stasis -Would benefit from lymphadema pumps should swelling issues persist -Multilayer compression wrap applied bilat. -Orders for [facility] to change multilayer compression wrap twice weekly until she gets lymphedema pumps.  Return in about 4 weeks (around 11/17/2017) for Lymphedema f/u.

## 2017-11-01 ENCOUNTER — Telehealth: Payer: Self-pay | Admitting: *Deleted

## 2017-11-01 NOTE — Telephone Encounter (Signed)
Left message to call for unna boot orders.

## 2017-11-01 NOTE — Telephone Encounter (Signed)
April - [facility] states he needs clarification for pt's orders.

## 2017-11-06 ENCOUNTER — Telehealth: Payer: Self-pay | Admitting: Podiatry

## 2017-11-06 NOTE — Telephone Encounter (Signed)
I was wondering if I can take this bandage off, and put my stocking back on.

## 2017-11-07 NOTE — Telephone Encounter (Signed)
Ok to do so

## 2017-11-07 NOTE — Telephone Encounter (Signed)
Left message informing pt of Dr. Eleanora Neighbor recommendation.

## 2017-11-16 ENCOUNTER — Ambulatory Visit: Payer: Medicare Other | Admitting: Podiatry

## 2017-11-17 ENCOUNTER — Ambulatory Visit: Payer: Medicare Other | Admitting: Podiatry

## 2017-11-20 ENCOUNTER — Telehealth: Payer: Self-pay | Admitting: Podiatry

## 2017-11-20 NOTE — Telephone Encounter (Signed)
Verbal approval/order needed for physical therapy twice a week for 8 weeks. Working to get patient stronger and safely back on feet.

## 2017-11-21 ENCOUNTER — Telehealth: Payer: Self-pay | Admitting: Podiatry

## 2017-11-21 NOTE — Telephone Encounter (Signed)
Requesting approval for orders for physical therapthy. Also requesting to change patients skilled nursing to twice a week instead of once a week. Patients PCP has agreed to give the approved orders but they need approval from [place] to do so.   Please give them a call.  [phone] Dominica Severin

## 2017-11-24 ENCOUNTER — Other Ambulatory Visit: Payer: Self-pay | Admitting: Pulmonary Disease

## 2017-11-27 ENCOUNTER — Telehealth: Payer: Self-pay | Admitting: Podiatry

## 2017-11-27 NOTE — Telephone Encounter (Signed)
[facility] has called again. Requesting approval for orders for physical therapthy. Also requesting to change patients skilled nursing to twice a week instead of once a week. Patients PCP has agreed to give the approved orders but they need approval from [place] to do so.     Please give them a call. [phone] Dominica Severin

## 2017-11-27 NOTE — Telephone Encounter (Signed)
Unable to leave a message phone rang for over a minute.

## 2017-11-27 NOTE — Telephone Encounter (Signed)
That's fine. She can work with PT, especially if they can do edema reduction

## 2017-11-28 NOTE — Telephone Encounter (Signed)
I informed Saddie Benders, Nurse Manager - [facility], Dr. March Rummage had okayed the twice week skilled nursing and PT to include edema reduction. Saddie Benders states they are having a problem with pt's BP and are working with her PCP.

## 2017-12-11 ENCOUNTER — Other Ambulatory Visit: Payer: Self-pay

## 2017-12-11 NOTE — Telephone Encounter (Signed)
Rec'd fax patient needing refill on Singulair 10mg , Pt last seen in 10/17/16, needs to be seen by [facility] prior to refill. LVM for patient to return call regarding fax.[id number]

## 2017-12-14 ENCOUNTER — Encounter: Payer: Self-pay | Admitting: Internal Medicine

## 2018-06-16 ENCOUNTER — Other Ambulatory Visit: Payer: Self-pay

## 2018-06-16 ENCOUNTER — Emergency Department (HOSPITAL_COMMUNITY): Payer: Medicare Other

## 2018-06-16 ENCOUNTER — Inpatient Hospital Stay (HOSPITAL_COMMUNITY)
Admission: EM | Admit: 2018-06-16 | Discharge: 2018-06-20 | DRG: 637 | Disposition: A | Discharge status: Skilled Nursing Facility | Payer: Medicare Other | Attending: Internal Medicine | Admitting: Internal Medicine

## 2018-06-16 ENCOUNTER — Encounter (HOSPITAL_COMMUNITY): Payer: Self-pay | Admitting: Emergency Medicine

## 2018-06-16 DIAGNOSIS — F329 Major depressive disorder, single episode, unspecified: Secondary | ICD-10-CM | POA: Diagnosis present

## 2018-06-16 DIAGNOSIS — I361 Nonrheumatic tricuspid (valve) insufficiency: Secondary | ICD-10-CM | POA: Diagnosis not present

## 2018-06-16 DIAGNOSIS — K219 Gastro-esophageal reflux disease without esophagitis: Secondary | ICD-10-CM | POA: Diagnosis present

## 2018-06-16 DIAGNOSIS — I83009 Varicose veins of unspecified lower extremity with ulcer of unspecified site: Secondary | ICD-10-CM | POA: Diagnosis present

## 2018-06-16 DIAGNOSIS — Z86010 Personal history of colonic polyps: Secondary | ICD-10-CM | POA: Diagnosis not present

## 2018-06-16 DIAGNOSIS — L89150 Pressure ulcer of sacral region, unstageable: Secondary | ICD-10-CM | POA: Diagnosis present

## 2018-06-16 DIAGNOSIS — Z87891 Personal history of nicotine dependence: Secondary | ICD-10-CM

## 2018-06-16 DIAGNOSIS — E876 Hypokalemia: Secondary | ICD-10-CM | POA: Diagnosis not present

## 2018-06-16 DIAGNOSIS — Z8711 Personal history of peptic ulcer disease: Secondary | ICD-10-CM | POA: Diagnosis not present

## 2018-06-16 DIAGNOSIS — G4733 Obstructive sleep apnea (adult) (pediatric): Secondary | ICD-10-CM | POA: Diagnosis present

## 2018-06-16 DIAGNOSIS — E7800 Pure hypercholesterolemia, unspecified: Secondary | ICD-10-CM | POA: Diagnosis present

## 2018-06-16 DIAGNOSIS — I5033 Acute on chronic diastolic (congestive) heart failure: Secondary | ICD-10-CM | POA: Diagnosis present

## 2018-06-16 DIAGNOSIS — Z9981 Dependence on supplemental oxygen: Secondary | ICD-10-CM

## 2018-06-16 DIAGNOSIS — B964 Proteus (mirabilis) (morganii) as the cause of diseases classified elsewhere: Secondary | ICD-10-CM | POA: Diagnosis present

## 2018-06-16 DIAGNOSIS — I340 Nonrheumatic mitral (valve) insufficiency: Secondary | ICD-10-CM | POA: Diagnosis not present

## 2018-06-16 DIAGNOSIS — L97909 Non-pressure chronic ulcer of unspecified part of unspecified lower leg with unspecified severity: Secondary | ICD-10-CM | POA: Diagnosis present

## 2018-06-16 DIAGNOSIS — N390 Urinary tract infection, site not specified: Secondary | ICD-10-CM | POA: Diagnosis present

## 2018-06-16 DIAGNOSIS — Z96653 Presence of artificial knee joint, bilateral: Secondary | ICD-10-CM | POA: Diagnosis present

## 2018-06-16 DIAGNOSIS — N3000 Acute cystitis without hematuria: Secondary | ICD-10-CM | POA: Diagnosis not present

## 2018-06-16 DIAGNOSIS — M1990 Unspecified osteoarthritis, unspecified site: Secondary | ICD-10-CM | POA: Diagnosis present

## 2018-06-16 DIAGNOSIS — E785 Hyperlipidemia, unspecified: Secondary | ICD-10-CM | POA: Diagnosis present

## 2018-06-16 DIAGNOSIS — R739 Hyperglycemia, unspecified: Secondary | ICD-10-CM | POA: Diagnosis present

## 2018-06-16 DIAGNOSIS — B961 Klebsiella pneumoniae [K. pneumoniae] as the cause of diseases classified elsewhere: Secondary | ICD-10-CM | POA: Diagnosis present

## 2018-06-16 DIAGNOSIS — N184 Chronic kidney disease, stage 4 (severe): Secondary | ICD-10-CM | POA: Diagnosis present

## 2018-06-16 DIAGNOSIS — J449 Chronic obstructive pulmonary disease, unspecified: Secondary | ICD-10-CM | POA: Diagnosis present

## 2018-06-16 DIAGNOSIS — I5031 Acute diastolic (congestive) heart failure: Secondary | ICD-10-CM | POA: Diagnosis not present

## 2018-06-16 DIAGNOSIS — R531 Weakness: Secondary | ICD-10-CM

## 2018-06-16 DIAGNOSIS — D631 Anemia in chronic kidney disease: Secondary | ICD-10-CM | POA: Diagnosis present

## 2018-06-16 DIAGNOSIS — Z794 Long term (current) use of insulin: Secondary | ICD-10-CM

## 2018-06-16 DIAGNOSIS — J9611 Chronic respiratory failure with hypoxia: Secondary | ICD-10-CM | POA: Diagnosis present

## 2018-06-16 DIAGNOSIS — E1100 Type 2 diabetes mellitus with hyperosmolarity without nonketotic hyperglycemic-hyperosmolar coma (NKHHC): Secondary | ICD-10-CM | POA: Diagnosis not present

## 2018-06-16 DIAGNOSIS — Z20828 Contact with and (suspected) exposure to other viral communicable diseases: Secondary | ICD-10-CM | POA: Diagnosis present

## 2018-06-16 DIAGNOSIS — Z888 Allergy status to other drugs, medicaments and biological substances status: Secondary | ICD-10-CM

## 2018-06-16 DIAGNOSIS — E1122 Type 2 diabetes mellitus with diabetic chronic kidney disease: Secondary | ICD-10-CM | POA: Diagnosis present

## 2018-06-16 DIAGNOSIS — R7989 Other specified abnormal findings of blood chemistry: Secondary | ICD-10-CM | POA: Diagnosis not present

## 2018-06-16 DIAGNOSIS — I130 Hypertensive heart and chronic kidney disease with heart failure and stage 1 through stage 4 chronic kidney disease, or unspecified chronic kidney disease: Secondary | ICD-10-CM | POA: Diagnosis present

## 2018-06-16 DIAGNOSIS — I083 Combined rheumatic disorders of mitral, aortic and tricuspid valves: Secondary | ICD-10-CM | POA: Diagnosis present

## 2018-06-16 DIAGNOSIS — L89159 Pressure ulcer of sacral region, unspecified stage: Secondary | ICD-10-CM

## 2018-06-16 DIAGNOSIS — Z79899 Other long term (current) drug therapy: Secondary | ICD-10-CM

## 2018-06-16 DIAGNOSIS — Z803 Family history of malignant neoplasm of breast: Secondary | ICD-10-CM

## 2018-06-16 DIAGNOSIS — E119 Type 2 diabetes mellitus without complications: Secondary | ICD-10-CM

## 2018-06-16 DIAGNOSIS — I878 Other specified disorders of veins: Secondary | ICD-10-CM | POA: Diagnosis present

## 2018-06-16 LAB — SARS CORONAVIRUS 2 BY RT PCR (HOSPITAL ORDER, PERFORMED IN ~~LOC~~ HOSPITAL LAB): SARS Coronavirus 2: NEGATIVE

## 2018-06-16 LAB — URINALYSIS, ROUTINE W REFLEX MICROSCOPIC
Bilirubin Urine: NEGATIVE
Glucose, UA: >=500 mg/dL — AB
Ketones, ur: NEGATIVE mg/dL
Nitrite: NEGATIVE
Protein, ur: NEGATIVE mg/dL
Specific Gravity, Urine: 1.011 (ref 1.005–1.030)
WBC, UA: >50 WBC/hpf — ABNORMAL HIGH (ref 0–5)
pH: 5 (ref 5.0–8.0)

## 2018-06-16 LAB — COMPREHENSIVE METABOLIC PANEL
ALT: 16 U/L (ref 0–44)
AST: 12 U/L — ABNORMAL LOW (ref 15–41)
Albumin: 2.9 g/dL — ABNORMAL LOW (ref 3.5–5.0)
Alkaline Phosphatase: 115 U/L (ref 38–126)
Anion gap: 12 (ref 5–15)
BUN: 74 mg/dL — ABNORMAL HIGH (ref 8–23)
CO2: 18 mmol/L — ABNORMAL LOW (ref 22–32)
Calcium: 8.9 mg/dL (ref 8.9–10.3)
Chloride: 103 mmol/L (ref 98–111)
Creatinine, Ser: 2.65 mg/dL — ABNORMAL HIGH (ref 0.44–1.00)
GFR calc Af Amer: 20 mL/min — ABNORMAL LOW (ref >60)
GFR calc non Af Amer: 17 mL/min — ABNORMAL LOW (ref >60)
Glucose, Bld: 800 mg/dL (ref 70–99)
Potassium: 3.6 mmol/L (ref 3.5–5.1)
Sodium: 133 mmol/L — ABNORMAL LOW (ref 135–145)
Total Bilirubin: 0.7 mg/dL (ref 0.3–1.2)
Total Protein: 7.6 g/dL (ref 6.5–8.1)

## 2018-06-16 LAB — CBG MONITORING, ED
Glucose-Capillary: 576 mg/dL (ref 70–99)
Glucose-Capillary: >600 mg/dL (ref 70–99)
Glucose-Capillary: >600 mg/dL (ref 70–99)

## 2018-06-16 LAB — BLOOD GAS, VENOUS
Acid-base deficit: 5.4 mmol/L — ABNORMAL HIGH (ref 0.0–2.0)
Bicarbonate: 18.8 mmol/L — ABNORMAL LOW (ref 20.0–28.0)
FIO2: 21
O2 Saturation: 81.7 %
Patient temperature: 98.6
pCO2, Ven: 33.7 mmHg — ABNORMAL LOW (ref 44.0–60.0)
pH, Ven: 7.365 (ref 7.250–7.430)
pO2, Ven: 50.3 mmHg — ABNORMAL HIGH (ref 32.0–45.0)

## 2018-06-16 LAB — LACTIC ACID, PLASMA: Lactic Acid, Venous: 1.2 mmol/L (ref 0.5–1.9)

## 2018-06-16 LAB — CBC
HCT: 26.4 % — ABNORMAL LOW (ref 36.0–46.0)
Hemoglobin: 8.7 g/dL — ABNORMAL LOW (ref 12.0–15.0)
MCH: 29 pg (ref 26.0–34.0)
MCHC: 33 g/dL (ref 30.0–36.0)
MCV: 88 fL (ref 80.0–100.0)
Platelets: 433 10*3/uL — ABNORMAL HIGH (ref 150–400)
RBC: 3 MIL/uL — ABNORMAL LOW (ref 3.87–5.11)
RDW: 14.6 % (ref 11.5–15.5)
WBC: 14.7 10*3/uL — ABNORMAL HIGH (ref 4.0–10.5)
nRBC: 0 % (ref 0.0–0.2)

## 2018-06-16 LAB — TROPONIN I: Troponin I: 0.09 ng/mL (ref <0.03)

## 2018-06-16 MED ORDER — PIPERACILLIN-TAZOBACTAM 3.375 G IVPB 30 MIN
3.3750 g | Freq: Once | INTRAVENOUS | Status: AC
  Administered 2018-06-16: 3.375 g via INTRAVENOUS
  Filled 2018-06-16: qty 50

## 2018-06-16 MED ORDER — DEXTROSE-NACL 5-0.45 % IV SOLN: INTRAVENOUS | Status: DC

## 2018-06-16 MED ORDER — SODIUM CHLORIDE 0.9 % IV SOLN
1.0000 g | INTRAVENOUS | Status: DC
  Administered 2018-06-17 – 2018-06-20 (×4): 1 g via INTRAVENOUS
  Filled 2018-06-16: qty 10
  Filled 2018-06-16 (×3): qty 1

## 2018-06-16 MED ORDER — ACETAMINOPHEN 650 MG RE SUPP: 650.0000 mg | Freq: Four times a day (QID) | RECTAL | Status: DC | PRN

## 2018-06-16 MED ORDER — HYDRALAZINE HCL 20 MG/ML IJ SOLN: 5.0000 mg | INTRAMUSCULAR | Status: DC | PRN

## 2018-06-16 MED ORDER — INSULIN REGULAR(HUMAN) IN NACL 100-0.9 UT/100ML-% IV SOLN
INTRAVENOUS | Status: DC
  Administered 2018-06-16: 5.4 [IU]/h via INTRAVENOUS
  Filled 2018-06-16: qty 100

## 2018-06-16 MED ORDER — IPRATROPIUM-ALBUTEROL 0.5-2.5 (3) MG/3ML IN SOLN
3.0000 mL | Freq: Four times a day (QID) | RESPIRATORY_TRACT | Status: DC | PRN
  Administered 2018-06-19: 3 mL via RESPIRATORY_TRACT
  Filled 2018-06-16: qty 3

## 2018-06-16 MED ORDER — LACTATED RINGERS IV SOLN
INTRAVENOUS | Status: DC
  Administered 2018-06-16: 125 mL/h via INTRAVENOUS

## 2018-06-16 MED ORDER — ACETAMINOPHEN 325 MG PO TABS
650.0000 mg | ORAL_TABLET | Freq: Four times a day (QID) | ORAL | Status: DC | PRN
  Administered 2018-06-17 – 2018-06-19 (×2): 650 mg via ORAL
  Filled 2018-06-16 (×2): qty 2

## 2018-06-16 MED ORDER — HEPARIN SODIUM (PORCINE) 5000 UNIT/ML IJ SOLN
5000.0000 [IU] | Freq: Three times a day (TID) | INTRAMUSCULAR | Status: DC
  Administered 2018-06-17 – 2018-06-20 (×12): 5000 [IU] via SUBCUTANEOUS
  Filled 2018-06-16 (×13): qty 1

## 2018-06-16 MED ORDER — POTASSIUM CHLORIDE CRYS ER 20 MEQ PO TBCR
40.0000 meq | EXTENDED_RELEASE_TABLET | Freq: Once | ORAL | Status: AC
  Administered 2018-06-17: 40 meq via ORAL
  Filled 2018-06-16: qty 2

## 2018-06-16 NOTE — H&P (Signed)
History and Physical    [name] GBT:[id number] DOB: Mar 26, 1940 DOA: 06/16/2018  PCP: Burnard Bunting, MD Patient coming from: Home  Chief Complaint: Generalized weakness, hyperglycemia  HPI: [name] is a 78 y.o. female with medical history significant of type 2 diabetes, chronic diastolic congestive heart failure, CKD 4, hypertension, hyperlipidemia, COPD on home oxygen, GERD, OSA presenting to the hospital for evaluation of generalized weakness and hyperglycemia.  Patient states her friend asked her to come into the hospital.  Reports having chronic shortness of breath and cough secondary to COPD, no recent change.  Denies any fevers, chills, chest pain, nausea, vomiting, abdominal pain, diarrhea, or dysuria.  States she takes Lantus 20 units at bedtime and Humalog 75/25 20 units once daily in the morning.  States she takes torsemide twice a day for the swelling in her legs.  She believes the swelling is worse than her baseline.  ED Course: Afebrile and hemodynamically stable.  Blood glucose 800.  Bicarb 18 and anion gap 12.  BUN 74.  Creatinine 2.6, at baseline.  LFTs normal.  White count 14.7.  Lactic acid normal.  Hemoglobin 8.7, baseline in the 8-9 range.  UA with evidence of glucosuria but no ketones.  Also showing moderate amount of leukocytes, greater than 50 WBCs, and many bacteria.  Troponin 0.09.  EKG without acute ischemic changes.  Chest x-ray showing mild cardiomegaly and mild vascular congestion which is improved.  Patient was started on insulin infusion and received Zosyn in the ED.  Review of Systems:  All systems reviewed and apart from history of presenting illness, are negative.  Past Medical History:  Diagnosis Date  . Adenomatous colon polyp   . Anemia   . Arthritis   . Asthma   . COPD (chronic obstructive pulmonary disease) ([place])   . COPD with asthma ([place]) 04/20/2011  . Depression   . Diabetes mellitus   . Diverticulosis   . Gastric ulcer   . GERD  (gastroesophageal reflux disease)   . HTN (hypertension)   . Hypercholesterolemia   . Internal hemorrhoids   . Lumbar disc disease   . On home oxygen therapy    "2-3L @ bedtime" (04/04/2017)  . OSA (obstructive sleep apnea) 09/17/2012  . Peptic ulcer disease   . 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 Umbilical hernia   . Valgus foot    right     Past Surgical History:  Procedure Laterality Date  . COLONOSCOPY  2209 or 2010    2001, 2005, 2009  colonoscopy.  [place]  . COLONOSCOPY N/A 08/14/2012   Procedure: COLONOSCOPY;  Surgeon: Ladene Artist, MD;  Location: [facility] ENDOSCOPY;  Service: Endoscopy;  Laterality: N/A;  . INCISE AND DRAIN ABCESS     Abdominal Wall  . LIPOMA RESECTION     Right Neck  . TOTAL KNEE ARTHROPLASTY     Bilateral  . UMBILICAL HERNIA REPAIR  2007   incarcerated  . UPPER GASTROINTESTINAL ENDOSCOPY  2001   medoff Grade A esophagitis     reports that she quit smoking about 60 years ago. Her smoking use included cigarettes. She has a 10.00 pack-year smoking history. She has never used smokeless tobacco. She reports that she does not drink alcohol or use drugs.  Allergies  Allergen Reactions  . Other Other (See Comments)    "all generics make her sick"    Family History  Problem Relation Age of Onset  . Breast cancer Maternal Aunt  Prior to Admission medications   Medication Sig Start Date End Date Taking? Authorizing Provider  amLODipine (NORVASC) 5 MG tablet Take 10 mg by mouth daily.     [provider]  augmented betamethasone dipropionate (DIPROLENE-AF) 0.05 % cream Apply 1 application topically 2 (two) times daily. To both legs    [provider]  budesonide (PULMICORT) 0.25 MG/2ML nebulizer solution Take 2 mLs (0.25 mg total) by nebulization 2 (two) times daily. 10/05/16   Chesley Mires, MD  calcitRIOL (ROCALTROL) 0.25 MCG capsule Take 1 capsule (0.25 mcg total) by mouth every Monday, Wednesday, and Friday. 12/05/16    Velvet Bathe, MD  Calcium Carbonate-Vitamin D (CALTRATE 600+D) 600-400 MG-UNIT per tablet Take 1 tablet by mouth daily.    [provider]  cyclobenzaprine (FLEXERIL) 10 MG tablet Take 10 mg 3 (three) times daily as needed by mouth for muscle spasms.    [provider]  fluticasone (FLONASE) 50 MCG/ACT nasal spray Place 2 sprays into both nostrils daily. 11/07/13   Chesley Mires, MD  folic acid (FOLVITE) 914 MCG tablet Take 400 mcg by mouth daily.    [provider]  HUMALOG MIX 75/25 KWIKPEN (75-25) 100 UNIT/ML SUSP Inject 15 Units into the skin daily.  09/01/11   [provider]  insulin glargine (LANTUS) 100 UNIT/ML injection Inject 0.15 mLs (15 Units total) into the skin at bedtime. 04/08/17   Caren Griffins, MD  ipratropium-albuterol (DUONEB) 0.5-2.5 (3) MG/3ML SOLN Take 3 mLs by nebulization every 6 (six) hours as needed. Patient taking differently: Take 3 mLs every 6 (six) hours as needed by nebulization (SOB).  10/05/16   Chesley Mires, MD  iron polysaccharides (NIFEREX) 150 MG capsule Take 150 mg by mouth 2 (two) times daily.    [provider]  LORazepam (ATIVAN) 1 MG tablet Take 0.5-1 tablets (0.5-1 mg total) by mouth See admin instructions. Take 0.5 tablet every morning and 1 tablet in the evening as needed for sleep 04/08/17   Caren Griffins, MD  magnesium oxide (MAG-OX) 400 MG tablet Take 400 mg by mouth daily.    [provider]  methocarbamol (ROBAXIN) 500 MG tablet Take 500 mg 3 (three) times daily as needed by mouth for muscle spasms.  02/07/14   [provider]  montelukast (SINGULAIR) 10 MG tablet Take 1 tablet (10 mg total) by mouth at bedtime. 10/05/16   Chesley Mires, MD  NEXIUM 40 MG capsule Take 1 capsule in morning by mouth 10/24/13   [provider]  Omega-3 Fatty Acids (FISH OIL) 1000 MG CAPS Take 1 capsule by mouth daily.    [provider]  potassium chloride SA (K-DUR,KLOR-CON) 20 MEQ tablet  Take 20 mEq by mouth daily.    [provider]  prochlorperazine (COMPAZINE) 5 MG tablet Take 5 mg by mouth every 6 (six) hours as needed for nausea.    [provider]  Propylene Glycol (SYSTANE BALANCE) 0.6 % SOLN Apply 1 drop to eye 2 (two) times daily.    [provider]  pyridOXINE (VITAMIN B-6) 100 MG tablet Take 100 mg by mouth daily.    [provider]  torsemide (DEMADEX) 10 MG tablet Take 5 tablets (50 mg total) by mouth 2 (two) times daily. 12/02/16   Velvet Bathe, MD  traMADol (ULTRAM) 50 MG tablet Take 1 tablet (50 mg total) by mouth every 6 (six) hours as needed for severe pain. 04/08/17   Caren Griffins, MD  traZODone (DESYREL) 50 MG  tablet Take 50 mg at bedtime as needed by mouth for sleep.    [provider]  vitamin B-12 (CYANOCOBALAMIN) 1000 MCG tablet Take 1,000 mcg by mouth daily.    [provider]  Vitamin D, Ergocalciferol, (DRISDOL) 50000 UNITS CAPS capsule Take 50,000 Units by mouth 2 (two) times a week. 04/07/14   [provider]  vitamin E 400 UNIT capsule Take 400 Units by mouth daily.    [provider]  ZOCOR 20 MG tablet Take 20 mg by mouth daily. 03/16/14   [provider]    Physical Exam: Vitals:   06/16/18 2206 06/16/18 2207 06/16/18 2209 06/16/18 2210  BP:      Pulse: 93 95 94 95  Resp: (!) 21 18 (!) 22 (!) 23  Temp:      TempSrc:      SpO2: 96% 98% 99% 97%  Weight:      Height:        Physical Exam  Constitutional: She is oriented to person, place, and time. She appears well-developed and well-nourished. No distress.  HENT:  Head: Normocephalic.  Mouth/Throat: Oropharynx is clear and moist.  Eyes: Right eye exhibits no discharge. Left eye exhibits no discharge.  Neck: Neck supple. JVD present.  Cardiovascular: Normal rate, regular rhythm and intact distal pulses.  Pulmonary/Chest: Effort normal and breath sounds normal. No respiratory distress. She has no wheezes.   Mild crackles appreciated at the left lung base  Abdominal: Soft. Bowel sounds are normal. She exhibits no distension. There is no abdominal tenderness. There is no guarding.  Musculoskeletal:        General: Edema present.     Comments: Significant +4 pitting edema of bilateral lower extremities  Neurological: She is alert and oriented to person, place, and time.  Skin: Skin is warm and dry. She is not diaphoretic.  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  Wounds do not appear infected. Sacral decubitus ulcers.  No purulent drainage or foul smell.     Labs on Admission: I have personally reviewed following labs and imaging studies  CBC: Recent Labs  Lab 06/16/18 2015  WBC 14.7*  HGB 8.7*  HCT 26.4*  MCV 88.0  PLT 854*   Basic Metabolic Panel: Recent Labs  Lab 06/16/18 2017  NA 133*  K 3.6  CL 103  CO2 18*  GLUCOSE 800*  BUN 74*  CREATININE 2.65*  CALCIUM 8.9   GFR: Estimated Creatinine Clearance: 19.3 mL/min (A) (by C-G formula based on SCr of 2.65 mg/dL (H)). Liver Function Tests: Recent Labs  Lab 06/16/18 2017  AST 12*  ALT 16  ALKPHOS 115  BILITOT 0.7  PROT 7.6  ALBUMIN 2.9*   No results for input(s): LIPASE, AMYLASE in the last 168 hours. No results for input(s): AMMONIA in the last 168 hours. Coagulation Profile: No results for input(s): INR, PROTIME in the last 168 hours. Cardiac Enzymes: Recent Labs  Lab 06/16/18 2017  TROPONINI 0.09*   BNP (last 3 results) No results for input(s): PROBNP in the last 8760 hours. HbA1C: No results for input(s): HGBA1C in the last 72 hours. CBG: Recent Labs  Lab 06/16/18 1952 06/16/18 2246  GLUCAP >600* >600*   Lipid Profile: No results for input(s): CHOL, HDL, LDLCALC, TRIG, CHOLHDL, LDLDIRECT in the last 72 hours. Thyroid Function Tests: No results for input(s): TSH, T4TOTAL, FREET4, T3FREE, THYROIDAB in the last 72 hours. Anemia Panel: No results  for input(s): VITAMINB12, FOLATE,  FERRITIN, TIBC, IRON, RETICCTPCT in the last 72 hours. Urine analysis:    Component Value Date/Time   COLORURINE YELLOW 06/16/2018 2016   APPEARANCEUR HAZY (A) 06/16/2018 2016   LABSPEC 1.011 06/16/2018 2016   PHURINE 5.0 06/16/2018 2016   GLUCOSEU >=500 (A) 06/16/2018 2016   HGBUR MODERATE (A) 06/16/2018 2016   BILIRUBINUR NEGATIVE 06/16/2018 2016   KETONESUR NEGATIVE 06/16/2018 2016   PROTEINUR NEGATIVE 06/16/2018 2016   UROBILINOGEN 0.2 12/16/2011 1717   NITRITE NEGATIVE 06/16/2018 2016   LEUKOCYTESUR MODERATE (A) 06/16/2018 2016    Radiological Exams on Admission: Dg Chest Port 1 View  Result Date: 06/16/2018 CLINICAL DATA:  Weakness 1 week. Swelling bilateral lower extremities. EXAM: PORTABLE CHEST 1 VIEW COMPARISON:  04/03/2017 FINDINGS: Patient is rotated to the left. Lungs are adequately inflated with subtle hazy opacification of the central pulmonary vasculature likely mild vascular congestion which is improved. No evidence of effusion or lobar consolidation. Mild cardiomegaly. Remainder of the exam is unchanged. IMPRESSION: Mild cardiomegaly with interval improved mild vascular congestion. Electronically Signed   By: Marin Olp M.D.   On: 06/16/2018 21:01    EKG: Independently reviewed.  Sinus rhythm, no acute ischemic changes.  Assessment/Plan Principal Problem:   Hyperglycemia Active Problems:   CKD (chronic kidney disease) stage 4, GFR 15-29 ml/min (HCC)   Type 2 diabetes mellitus (HCC)   UTI (urinary tract infection)   Sacral decubitus ulcer   Hyperglycemia, uncontrolled type 2 diabetes Blood glucose 800.  Bicarb 18 with a normal anion gap.  UA without ketones. -Hold off giving IV fluid as patient is volume overloaded -Insulin infusion until CBG <250, then initiate subcutaneous insulin -CBG monitoring every 1 hour -Check A1c  UTI Afebrile and hemodynamically stable.  White count 14.7.  Lactic acid normal.  UA with moderate  amount of leukocytes, greater than 50 WBCs, and many bacteria. -Received a dose of Zosyn in the ED.  Continue antibiotic coverage with ceftriaxone starting tomorrow. -Urine culture pending -Continue to monitor CBC  Sacral decubitus ulcers Do not appear infected. -Wound care consult  Elevated troponin Likely secondary to demand ischemia and severe baseline renal insufficiency.  Troponin 0.09, previously mildly elevated as well.  EKG without acute ischemic changes.  Patient denies any chest pain and appears comfortable on exam. -Cardiac monitoring -Trend troponin  Physical deconditioning -PT evaluation  Chronic diastolic congestive heart failure Volume overloaded on exam with mild vascular congestion on imaging, JVD, and significant bilateral lower extremity edema.  Not tachypneic.  No increased oxygen requirement from baseline. -Hold off giving diuretic at this time given significant hyperglycemia.  Patient will need IV diuresis in the morning.  CKD 4 -Stable. Creatinine 2.6, at baseline.    Chronic anemia -Stable. Hemoglobin 8.7, baseline in the 8-9 range.    Hypertension -Systolic currently in 735H.  Hydralazine PRN.  COPD and chronic hypoxic respiratory failure Stable.  No wheezing.  No tachypnea or increase in oxygen requirement from baseline. -DuoNebs PRN  OSA -CPAP at night  Unable to safely order home medications at this time as pharmacy medication reconciliation is pending.  DVT prophylaxis: Subcutaneous heparin Code Status: Patient wishes to be full code. Family Communication: No family available. Disposition Plan: Anticipate discharge after clinical improvement. Consults called: None Admission status: It is my clinical opinion that admission to INPATIENT is reasonable and necessary in this 78 y.o. female . presenting with hyperglycemia use, UTI, volume overload . in the context of PMH including: CKD 4, chronic diastolic congestive heart failure,  type 2 diabetes  . with pertinent positives on physical exam including: Signs of volume overload . and pertinent positives on radiographic and laboratory data including: Chest x-ray with mild vascular congestion.  UA suggestive of UTI. . Workup and treatment include IV antibiotic and IV insulin.  Patient is volume overloaded and will need IV diuresis tomorrow after resolution of hyperglycemia.  Given the aforementioned, the predictability of an adverse outcome is felt to be significant. I expect that the patient will require at least 2 midnights in the hospital to treat this condition.   The medical decision making on this patient was of high complexity and the patient is at high risk for clinical deterioration, therefore this is a level 3 visit.  Shela Leff MD [facility] Pager [phone]  If 7PM-7AM, please contact night-coverage [email or web address] Password [email or web address]  06/16/2018, 10:50 PM

## 2018-06-16 NOTE — ED Notes (Signed)
Patient has open wounds on both sides of buttocks, patient has open wounds underneath both lower legs. The leg wounds are pink and the buttocks is pink but has some yellowish slough.

## 2018-06-16 NOTE — ED Provider Notes (Signed)
[facility] DEPT Provider Note   CSN: [id number] Arrival date & time: 06/16/18  1942    History   Chief Complaint Chief Complaint  Patient presents with  . Weakness  . Hyperglycemia    HPI [name] is a 78 y.o. female.     The history is provided by the patient, medical records and the EMS personnel. No language interpreter was used.  Weakness  Hyperglycemia  Associated symptoms: weakness    [name] is a 78 y.o. female who presents to the Emergency Department complaining of weakness, hyperglycemia. Level V caveat due to confusion. She presents to the emergency department by EMS for evaluation of weakness and hyperglycemia. She has been weak for about a week according to EMS reports and her blood sugars reading high. She complains of feeling a week with shortness of breath. She is unsure why she is in the emergency department. She denies any fevers, chills, chest pain, abdominal pain, nausea, vomiting, diarrhea. She states she lives at home with family. Past Medical History:  Diagnosis Date  . Adenomatous colon polyp   . Anemia   . Arthritis   . Asthma   . COPD (chronic obstructive pulmonary disease) ([place])   . 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 Depression   . Diabetes mellitus   . Diverticulosis   . Gastric ulcer   . GERD (gastroesophageal reflux disease)   . HTN (hypertension)   . Hypercholesterolemia   . Internal hemorrhoids   . Lumbar disc disease   . On home oxygen therapy    "2-3L @ bedtime" (04/04/2017)  . OSA (obstructive sleep apnea) 09/17/2012  . Peptic ulcer disease   . 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 Umbilical hernia   . Valgus foot    right     Patient Active Problem List   Diagnosis Date Noted  . Hyperglycemia 06/16/2018  . Type 2 diabetes mellitus ([place]) 06/16/2018  . UTI (urinary tract infection) 06/16/2018  . Sacral decubitus ulcer 06/16/2018  . Acute respiratory failure with hypoxia ([place])  04/04/2017  . Hypertensive urgency 04/04/2017  . CKD (chronic kidney disease) stage 4, GFR 15-29 ml/min ([facility]) 04/04/2017  . Hypoglycemia   . Acute renal failure superimposed on stage 3 chronic kidney disease ([place]) 11/23/2016  . Metabolic acidosis with normal anion gap and bicarbonate losses 11/23/2016  . Acute exacerbation of CHF (congestive heart failure) ([place]) 11/22/2016  . Acute on chronic respiratory failure with hypoxia ([place]) 11/22/2016  . Midfoot ulcer, left, limited to breakdown of skin ([place]) 06/13/2016  . Idiopathic chronic venous hypertension of both lower extremities with inflammation 03/10/2016  . Onychomycosis 03/10/2016  . Chronic obstructive pulmonary disease ([place]) 11/07/2013  . Upper airway cough syndrome 11/07/2013  . Insomnia 12/10/2012  . OSA (obstructive sleep apnea) 09/17/2012  . Benign neoplasm of colon 08/14/2012  . Rectal bleeding 08/13/2012    Class: Acute  . Anemia 08/13/2012    Class: Acute  . Osteoarthritis 08/13/2012    Class: Chronic  . Acute posthemorrhagic anemia 08/13/2012  . Diverticulosis of colon (without mention of hemorrhage) 08/13/2012  . Hoarseness 07/20/2011  . 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 Allergic rhinitis 04/20/2011  . GI bleed 02/08/2011  . Personal history of colonic polyps 04/16/2010  . Fecal incontinence 03/23/2010  . Change in bowel habits 03/23/2010    Past Surgical History:  Procedure Laterality Date  . COLONOSCOPY  2209 or 2010    2001, 2005,  2009  colonoscopy.  [place]  . COLONOSCOPY N/A 08/14/2012   Procedure: COLONOSCOPY;  Surgeon: Ladene Artist, MD;  Location: [facility] ENDOSCOPY;  Service: Endoscopy;  Laterality: N/A;  . INCISE AND DRAIN ABCESS     Abdominal Wall  . LIPOMA RESECTION     Right Neck  . TOTAL KNEE ARTHROPLASTY     Bilateral  . UMBILICAL HERNIA REPAIR  2007   incarcerated  . UPPER GASTROINTESTINAL ENDOSCOPY  2001   medoff Grade A esophagitis     OB History   No obstetric history on file.       Home Medications    Prior to Admission medications   Medication Sig Start Date End Date Taking? Authorizing Provider  amLODipine (NORVASC) 5 MG tablet Take 10 mg by mouth daily.    Yes [provider]  calcitRIOL (ROCALTROL) 0.25 MCG capsule Take 1 capsule (0.25 mcg total) by mouth every Monday, Wednesday, and Friday. 12/05/16  Yes Velvet Bathe, MD  Calcium Carbonate-Vitamin D (CALTRATE 600+D) 600-400 MG-UNIT per tablet Take 1 tablet by mouth daily.   Yes [provider]  cyclobenzaprine (FLEXERIL) 10 MG tablet Take 10 mg 3 (three) times daily as needed by mouth for muscle spasms.   Yes [provider]  folic acid (FOLVITE) 161 MCG tablet Take 400 mcg by mouth daily.   Yes [provider]  HUMALOG MIX 75/25 KWIKPEN (75-25) 100 UNIT/ML SUSP Inject 20 Units into the skin daily.  09/01/11  Yes [provider]  insulin glargine (LANTUS) 100 UNIT/ML injection Inject 0.15 mLs (15 Units total) into the skin at bedtime. Patient taking differently: Inject 20 Units into the skin at bedtime.  04/08/17  Yes Gherghe, Vella Redhead, MD  ipratropium-albuterol (DUONEB) 0.5-2.5 (3) MG/3ML SOLN Take 3 mLs by nebulization every 6 (six) hours as needed. Patient taking differently: Take 3 mLs every 6 (six) hours as needed by nebulization (SOB).  10/05/16  Yes Chesley Mires, MD  iron polysaccharides (NIFEREX) 150 MG capsule Take 150 mg by mouth 2 (two) times daily.   Yes [provider]  LORazepam (ATIVAN) 1 MG tablet Take 0.5-1 tablets (0.5-1 mg total) by mouth See admin instructions. Take 0.5 tablet every morning and 1 tablet in the evening as needed for sleep Patient taking differently: Take 0.5-1 mg by mouth 2 (two) times daily as needed for anxiety or sleep. Take 0.5 tablet every morning and 1 tablet in the evening as needed for sleep  04/08/17  Yes Gherghe, Vella Redhead, MD  magnesium oxide (MAG-OX) 400 MG tablet Take 400 mg by mouth daily.   Yes [provider]   montelukast (SINGULAIR) 10 MG tablet Take 1 tablet (10 mg total) by mouth at bedtime. 10/05/16  Yes Chesley Mires, MD  Omega-3 Fatty Acids (FISH OIL) 1000 MG CAPS Take 1,000 mg by mouth daily.    Yes [provider]  prochlorperazine (COMPAZINE) 5 MG tablet Take 5 mg by mouth every 6 (six) hours as needed for nausea.   Yes [provider]  Propylene Glycol (SYSTANE BALANCE) 0.6 % SOLN Apply 1 drop to eye 2 (two) times daily as needed (dry eyes).    Yes [provider]  pyridOXINE (VITAMIN B-6) 100 MG tablet Take 100 mg by mouth daily.   Yes [provider]  torsemide (DEMADEX) 10 MG tablet Take 5 tablets (50 mg total) by mouth 2 (two) times daily. 12/02/16  Yes Velvet Bathe, MD  traZODone (DESYREL) 50 MG tablet Take 50 mg at  bedtime as needed by mouth for sleep.   Yes [provider]  vitamin B-12 (CYANOCOBALAMIN) 1000 MCG tablet Take 1,000 mcg by mouth daily.   Yes [provider]  Vitamin D, Ergocalciferol, (DRISDOL) 50000 UNITS CAPS capsule Take 50,000 Units by mouth 2 (two) times a week. 04/07/14  Yes [provider]  vitamin E 400 UNIT capsule Take 400 Units by mouth daily.   Yes [provider]  ZOCOR 20 MG tablet Take 20 mg by mouth daily. 03/16/14  Yes [provider]  budesonide (PULMICORT) 0.25 MG/2ML nebulizer solution Take 2 mLs (0.25 mg total) by nebulization 2 (two) times daily. Patient not taking: Reported on 06/16/2018 10/05/16   Chesley Mires, MD  fluticasone [facility]) 50 MCG/ACT nasal spray Place 2 sprays into both nostrils daily. Patient not taking: Reported on 06/16/2018 11/07/13   Chesley Mires, MD    Family History Family History  Problem Relation Age of Onset  . Breast cancer Maternal Aunt     Social History Social History   Tobacco Use  . Smoking status: Former Smoker    Packs/day: 2.00    Years: 5.00    Pack years: 10.00    Types: Cigarettes    Last attempt to quit: 01/10/1958    Years since  quitting: 60.4  . Smokeless tobacco: Never Used  Substance Use Topics  . Alcohol use: No    Comment: Quit when she quit smoking  . Drug use: No     Allergies   Other   Review of Systems Review of Systems  Neurological: Positive for weakness.  All other systems reviewed and are negative.    Physical Exam Updated Vital Signs BP (!) 156/61 (BP Location: Left Arm)   Pulse 88   Temp (!) 97.3 F (36.3 C) (Oral)   Resp (!) 31   Ht 5\' 3"  (1.6 m)   Wt 90.7 kg   SpO2 95%   BMI 35.43 kg/m   Physical Exam Vitals signs and nursing note reviewed.  Constitutional:      General: She is in acute distress.     Appearance: She is well-developed. She is ill-appearing.  HENT:     Head: Normocephalic and atraumatic.  Cardiovascular:     Rate and Rhythm: Normal rate and regular rhythm.     Heart sounds: No murmur.  Pulmonary:     Effort: Pulmonary effort is normal. No respiratory distress.     Breath sounds: Normal breath sounds.  Abdominal:     Palpations: Abdomen is soft.     Tenderness: There is no abdominal tenderness. There is no guarding or rebound.  Musculoskeletal:        General: Swelling present. No tenderness.     Comments: 3+ edema to BLE with erythema to BLE.  Sacral breakdown bilaterally  Skin:    General: Skin is warm and dry.  Neurological:     Mental Status: She is alert.     Comments: Generalized weakness, confused, slow to answer questions.  Oriented to place and time but disoriented to recent events.    Psychiatric:        Behavior: Behavior normal.        Thought Content: Thought content normal.      ED Treatments / Results  Labs (all labs ordered are listed, but only abnormal results are displayed) Labs Reviewed  CBC - Abnormal; Notable for the following components:      Result Value   WBC 14.7 (*)  RBC 3.00 (*)    Hemoglobin 8.7 (*)    HCT 26.4 (*)    Platelets 433 (*)    All other components within normal limits  URINALYSIS, ROUTINE W  REFLEX MICROSCOPIC - Abnormal; Notable for the following components:   APPearance HAZY (*)    Glucose, UA >=500 (*)    Hgb urine dipstick MODERATE (*)    Leukocytes,Ua MODERATE (*)    WBC, UA >50 (*)    Bacteria, UA MANY (*)    All other components within normal limits  COMPREHENSIVE METABOLIC PANEL - Abnormal; Notable for the following components:   Sodium 133 (*)    CO2 18 (*)    Glucose, Bld 800 (*)    BUN 74 (*)    Creatinine, Ser 2.65 (*)    Albumin 2.9 (*)    AST 12 (*)    GFR calc non Af Amer 17 (*)    GFR calc Af Amer 20 (*)    All other components within normal limits  BLOOD GAS, VENOUS - Abnormal; Notable for the following components:   pCO2, Ven 33.7 (*)    pO2, Ven 50.3 (*)    Bicarbonate 18.8 (*)    Acid-base deficit 5.4 (*)    All other components within normal limits  TROPONIN I - Abnormal; Notable for the following components:   Troponin I 0.09 (*)    All other components within normal limits  CBG MONITORING, ED - Abnormal; Notable for the following components:   Glucose-Capillary >600 (*)    All other components within normal limits  CBG MONITORING, ED - Abnormal; Notable for the following components:   Glucose-Capillary >600 (*)    All other components within normal limits  CBG MONITORING, ED - Abnormal; Notable for the following components:   Glucose-Capillary 576 (*)    All other components within normal limits  SARS CORONAVIRUS 2 (HOSPITAL ORDER, [place] [facility])  URINE CULTURE  LACTIC ACID, PLASMA  CBC  BASIC METABOLIC PANEL  TROPONIN I  TROPONIN I  HEMOGLOBIN A1C    EKG EKG Interpretation  Date/Time:  Saturday June 16 2018 20:29:57 EDT Ventricular Rate:  88 PR Interval:    QRS Duration: 110 QT Interval:  417 QTC Calculation: 496 R Axis:   32 Text Interpretation:  Sinus rhythm Ventricular premature complex LVH with secondary repolarization abnormality Borderline prolonged QT interval Confirmed by Quintella Reichert  [phone]) on 06/16/2018 8:32:06 PM   Radiology Dg Chest Port 1 View  Result Date: 06/16/2018 CLINICAL DATA:  Weakness 1 week. Swelling bilateral lower extremities. EXAM: PORTABLE CHEST 1 VIEW COMPARISON:  04/03/2017 FINDINGS: Patient is rotated to the left. Lungs are adequately inflated with subtle hazy opacification of the central pulmonary vasculature likely mild vascular congestion which is improved. No evidence of effusion or lobar consolidation. Mild cardiomegaly. Remainder of the exam is unchanged. IMPRESSION: Mild cardiomegaly with interval improved mild vascular congestion. Electronically Signed   By: Marin Olp M.D.   On: 06/16/2018 21:01    Procedures Procedures (including critical care time) CRITICAL CARE Performed by: Quintella Reichert   Total critical care time: 35 minutes  Critical care time was exclusive of separately billable procedures and treating other patients.  Critical care was necessary to treat or prevent imminent or life-threatening deterioration.  Critical care was time spent personally by me on the following activities: development of treatment plan with patient and/or surrogate as well as nursing, discussions with consultants, evaluation of patient's response to treatment,  examination of patient, obtaining history from patient or surrogate, ordering and performing treatments and interventions, ordering and review of laboratory studies, ordering and review of radiographic studies, pulse oximetry and re-evaluation of patient's condition.  Medications Ordered in ED Medications  insulin regular, human (MYXREDLIN) 100 units/ 100 mL infusion (15.5 Units/hr Intravenous Rate/Dose Change 06/17/18 0004)  heparin injection 5,000 Units (5,000 Units Subcutaneous Given 06/17/18 0009)  acetaminophen (TYLENOL) tablet 650 mg (650 mg Oral Given 06/17/18 0003)    Or  acetaminophen (TYLENOL) suppository 650 mg ( Rectal See Alternative 06/17/18 0003)  cefTRIAXone (ROCEPHIN) 1 g in sodium  chloride 0.9 % 100 mL IVPB (has no administration in time range)  hydrALAZINE (APRESOLINE) injection 5 mg (has no administration in time range)  ipratropium-albuterol (DUONEB) 0.5-2.5 (3) MG/3ML nebulizer solution 3 mL (has no administration in time range)  piperacillin-tazobactam (ZOSYN) IVPB 3.375 g (0 g Intravenous Stopped 06/16/18 2247)  potassium chloride SA (K-DUR) CR tablet 40 mEq (40 mEq Oral Given 06/17/18 0003)     Initial Impression / Assessment and Plan / ED Course  I have reviewed the triage vital signs and the nursing notes.  Pertinent labs & imaging results that were available during my care of the patient were reviewed by me and considered in my medical decision making (see chart for details).        Patient here for evaluation of hyperglycemia and progressive weakness over the last several days. Unable to reach family for additional information. She is edematous and volume overload on examination. She was treated with antibiotics for possible infectious source on initial evaluation. UA is consistent with UTI. Given she is volume overloaded she was only treated with gentle fluid hydration for her market hyperglycemia. She was started on glucose stabilizer. Plan to admit to the hospital service for further evaluation and treatment.  Final Clinical Impressions(s) / ED Diagnoses   Final diagnoses:  Hyperglycemia  Generalized weakness  Acute UTI    ED Discharge Orders    None       Quintella Reichert, MD 06/17/18 [phone]

## 2018-06-16 NOTE — ED Triage Notes (Signed)
Patient brought in by [facility]. Patient is complaining of decrease weakness x 1 week. Patient BS is reading high. Patient has wounds. Patients legs are swollen.

## 2018-06-16 NOTE — ED Notes (Signed)
Bed: WA08 Expected date:  Expected time:  Means of arrival:  Comments: EMS 78 yo female decreased mobility-CBG "High"-fluids NSR 195/89

## 2018-06-16 NOTE — ED Notes (Addendum)
CRITICAL VALUE STICKER  CRITICAL VALUE: Glucose 800 & Troponin 0.09  DATE & TIME NOTIFIED: 06/16/18 2121  MD NOTIFIED: Ralene Bathe MD  TIME OF NOTIFICATION: 2121

## 2018-06-16 NOTE — ED Notes (Signed)
ED TO INPATIENT HANDOFF REPORT  ED Nurse Name and Phone #: Fredonia Highland RN [phone]  S Name/Age/Gender [name] 78 y.o. female Room/Bed: WA08/WA08  Code Status   Code Status: Full Code  Home/SNF/Other Home Patient oriented to: self, place, time and situation Is this baseline? Yes   Triage Complete: Triage complete  Chief Complaint Hyperglycemia  Triage Note Patient brought in by [facility]. Patient is complaining of decrease weakness x 1 week. Patient BS is reading high. Patient has wounds. Patients legs are swollen.   Allergies Allergies  Allergen Reactions  . Other Other (See Comments)    "all generics make her sick"    Level of Care/Admitting Diagnosis ED Disposition    ED Disposition Condition [place]: [place] [[id number]]  Level of Care: Telemetry [5]  Admit to tele based on following criteria: Monitor for Ischemic changes  Covid Evaluation: Confirmed COVID Negative  Diagnosis: Hyperglycemia [297989]  Admitting Physician: Shela Leff [[id number]]  Attending Physician: Shela Leff [[id number]]  PT Class (Do Not Modify): Observation [104]  PT Acc Code (Do Not Modify): Observation [10022]       B Medical/Surgery History Past Medical History:  Diagnosis Date  . Adenomatous colon polyp   . Anemia   . Arthritis   . Asthma   . COPD (chronic obstructive pulmonary disease) ([place])   . 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 Depression   . Diabetes mellitus   . Diverticulosis   . Gastric ulcer   . GERD (gastroesophageal reflux disease)   . HTN (hypertension)   . Hypercholesterolemia   . Internal hemorrhoids   . Lumbar disc disease   . On home oxygen therapy    "2-3L @ bedtime" (04/04/2017)  . OSA (obstructive sleep apnea) 09/17/2012  . Peptic ulcer disease   . 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 Umbilical hernia   . Valgus foot    right    Past Surgical History:  Procedure Laterality Date  .  COLONOSCOPY  2209 or 2010    2001, 2005, 2009  colonoscopy.  [place]  . COLONOSCOPY N/A 08/14/2012   Procedure: COLONOSCOPY;  Surgeon: Ladene Artist, MD;  Location: [facility] ENDOSCOPY;  Service: Endoscopy;  Laterality: N/A;  . INCISE AND DRAIN ABCESS     Abdominal Wall  . LIPOMA RESECTION     Right Neck  . TOTAL KNEE ARTHROPLASTY     Bilateral  . UMBILICAL HERNIA REPAIR  2007   incarcerated  . UPPER GASTROINTESTINAL ENDOSCOPY  2001   medoff Grade A esophagitis     A IV Location/Drains/Wounds Patient Lines/Drains/Airways Status   Active Line/Drains/Airways    Name:   Placement date:   Placement time:   Site:   Days:   Peripheral IV 06/16/18 Left Hand   06/16/18    -    Hand   less than 1   External Urinary Catheter   04/03/17    2301    -   439          Intake/Output Last 24 hours No intake or output data in the 24 hours ending 06/16/18 2246  Labs/Imaging Results for orders placed or performed during the hospital encounter of 06/16/18 (from the past 48 hour(s))  CBG monitoring, ED     Status: Abnormal   Collection Time: 06/16/18  7:52 PM  Result Value Ref Range   Glucose-Capillary >600 (HH) 70 - 99 mg/dL  CBC  Status: Abnormal   Collection Time: 06/16/18  8:15 PM  Result Value Ref Range   WBC 14.7 (H) 4.0 - 10.5 K/uL   RBC 3.00 (L) 3.87 - 5.11 MIL/uL   Hemoglobin 8.7 (L) 12.0 - 15.0 g/dL   HCT 26.4 (L) 36.0 - 46.0 %   MCV 88.0 80.0 - 100.0 fL   MCH 29.0 26.0 - 34.0 pg   MCHC 33.0 30.0 - 36.0 g/dL   RDW 14.6 11.5 - 15.5 %   Platelets 433 (H) 150 - 400 K/uL   nRBC 0.0 0.0 - 0.2 %    Comment: Performed at [facility], [place] [address], [place], [place] [address]  Urinalysis, Routine w reflex microscopic     Status: Abnormal   Collection Time: 06/16/18  8:16 PM  Result Value Ref Range   Color, Urine YELLOW YELLOW   APPearance HAZY (A) CLEAR   Specific Gravity, Urine 1.011 1.005 - 1.030   pH 5.0 5.0 - 8.0   Glucose, UA >=500 (A) NEGATIVE mg/dL    Hgb urine dipstick MODERATE (A) NEGATIVE   Bilirubin Urine NEGATIVE NEGATIVE   Ketones, ur NEGATIVE NEGATIVE mg/dL   Protein, ur NEGATIVE NEGATIVE mg/dL   Nitrite NEGATIVE NEGATIVE   Leukocytes,Ua MODERATE (A) NEGATIVE   RBC / HPF 21-50 0 - 5 RBC/hpf   WBC, UA >50 (H) 0 - 5 WBC/hpf   Bacteria, UA MANY (A) NONE SEEN   Squamous Epithelial / LPF 0-5 0 - 5   Mucus PRESENT     Comment: Performed at [facility], [place] [address], [place], [place] [address]  Comprehensive metabolic panel     Status: Abnormal   Collection Time: 06/16/18  8:17 PM  Result Value Ref Range   Sodium 133 (L) 135 - 145 mmol/L   Potassium 3.6 3.5 - 5.1 mmol/L   Chloride 103 98 - 111 mmol/L   CO2 18 (L) 22 - 32 mmol/L   Glucose, Bld 800 (HH) 70 - 99 mg/dL    Comment: CRITICAL RESULT CALLED TO, READ BACK BY AND VERIFIED WITH: [facility] RN @ 2122 ON 06/16/2018 JACKSON,K    BUN 74 (H) 8 - 23 mg/dL   Creatinine, Ser 2.65 (H) 0.44 - 1.00 mg/dL   Calcium 8.9 8.9 - 10.3 mg/dL   Total Protein 7.6 6.5 - 8.1 g/dL   Albumin 2.9 (L) 3.5 - 5.0 g/dL   AST 12 (L) 15 - 41 U/L   ALT 16 0 - 44 U/L   Alkaline Phosphatase 115 38 - 126 U/L   Total Bilirubin 0.7 0.3 - 1.2 mg/dL   GFR calc non Af Amer 17 (L) >60 mL/min   GFR calc Af Amer 20 (L) >60 mL/min   Anion gap 12 5 - 15    Comment: Performed at [facility], [place] [address] [place], [place] [address]  Troponin I - Once     Status: Abnormal   Collection Time: 06/16/18  8:17 PM  Result Value Ref Range   Troponin I 0.09 (HH) <0.03 ng/mL    Comment: CRITICAL RESULT CALLED TO, READ BACK BY AND VERIFIED WITH: [facility] RN @2122  ON 06/16/2018 JACKSON,K Performed at [facility], [place] [address] [place], [place] [address]   Lactic acid, plasma     Status: None   Collection Time: 06/16/18  8:20 PM  Result Value Ref Range   Lactic Acid, Venous 1.2 0.5 - 1.9 mmol/L    Comment: Performed at [facility], [place] [address] [place], [place] [address]  Blood gas, venous     Status: Abnormal   Collection Time: 06/16/18  9:25 PM  Result Value Ref Range   FIO2 21.00    Delivery systems ROOM AIR    pH, Ven 7.365 7.250 - 7.430   pCO2, Ven 33.7 (L) 44.0 - 60.0 mmHg   pO2, Ven 50.3 (H) 32.0 - 45.0 mmHg   Bicarbonate 18.8 (L) 20.0 - 28.0 mmol/L   Acid-base deficit 5.4 (H) 0.0 - 2.0 mmol/L   O2 Saturation 81.7 %   Patient temperature 98.6    Collection site VEIN    Drawn by COLLECTED BY NURSE    Sample type VENOUS     Comment: Performed at [facility] [address], [place], [place] [address]   Dg Chest Port 1 View  Result Date: 06/16/2018 CLINICAL DATA:  Weakness 1 week. Swelling bilateral lower extremities. EXAM: PORTABLE CHEST 1 VIEW COMPARISON:  04/03/2017 FINDINGS: Patient is rotated to the left. Lungs are adequately inflated with subtle hazy opacification of the central pulmonary vasculature likely mild vascular congestion which is improved. No evidence of effusion or lobar consolidation. Mild cardiomegaly. Remainder of the exam is unchanged. IMPRESSION: Mild cardiomegaly with interval improved mild vascular congestion. Electronically Signed   By: Marin Olp M.D.   On: 06/16/2018 21:01    Pending Labs Unresulted Labs (From admission, onward)    Start     Ordered   06/17/18 0500  CBC  Tomorrow morning,   R     06/16/18 2237   06/17/18 8182  Basic metabolic panel  Tomorrow morning,   R     06/16/18 2237   06/16/18 2237  Troponin I - Now Then Q6H  Now then every 6 hours,   R     06/16/18 2237   06/16/18 2237  Hemoglobin A1c  Once,   R     06/16/18 2237   06/16/18 2018  Urine culture  ONCE - STAT,   STAT     06/16/18 2018   06/16/18 2018  SARS Coronavirus 2 (CEPHEID- Performed in [facility] hospital lab), Hosp Order  (Symptomatic Patients Labs with Precautions )  Once,   R     06/16/18 2018          Vitals/Pain Today's Vitals   06/16/18 2206 06/16/18 2207  06/16/18 2209 06/16/18 2210  BP:      Pulse: 93 95 94 95  Resp: (!) 21 18 (!) 22 (!) 23  Temp:      TempSrc:      SpO2: 96% 98% 99% 97%  Weight:      Height:      PainSc:        Isolation Precautions Droplet and Contact precautions  Medications Medications  insulin regular, human (MYXREDLIN) 100 units/ 100 mL infusion (5.4 Units/hr Intravenous New Bag/Given 06/16/18 2212)  heparin injection 5,000 Units (has no administration in time range)  acetaminophen (TYLENOL) tablet 650 mg (has no administration in time range)    Or  acetaminophen (TYLENOL) suppository 650 mg (has no administration in time range)  potassium chloride SA (K-DUR) CR tablet 40 mEq (has no administration in time range)  cefTRIAXone (ROCEPHIN) 1 g in sodium chloride 0.9 % 100 mL IVPB (has no administration in time range)  hydrALAZINE (APRESOLINE) injection 5 mg (has no administration in time range)  ipratropium-albuterol (DUONEB) 0.5-2.5 (3) MG/3ML nebulizer solution 3 mL (has no administration in time  range)  piperacillin-tazobactam (ZOSYN) IVPB 3.375 g (3.375 g Intravenous New Bag/Given 06/16/18 2216)    Mobility non-ambulatory Low fall risk   Focused Assessments Cardiac Assessment Handoff:    Lab Results  Component Value Date   TROPONINI 0.09 ([place]) 06/16/2018   Lab Results  Component Value Date   DDIMER 1.76 (H) 12/16/2011   Does the Patient currently have chest pain? No     R Recommendations: See Admitting Provider Note  Report given to:   Additional Notes:

## 2018-06-17 DIAGNOSIS — E876 Hypokalemia: Secondary | ICD-10-CM

## 2018-06-17 DIAGNOSIS — R7989 Other specified abnormal findings of blood chemistry: Secondary | ICD-10-CM

## 2018-06-17 DIAGNOSIS — N184 Chronic kidney disease, stage 4 (severe): Secondary | ICD-10-CM

## 2018-06-17 DIAGNOSIS — E1100 Type 2 diabetes mellitus with hyperosmolarity without nonketotic hyperglycemic-hyperosmolar coma (NKHHC): Principal | ICD-10-CM

## 2018-06-17 DIAGNOSIS — N3000 Acute cystitis without hematuria: Secondary | ICD-10-CM

## 2018-06-17 DIAGNOSIS — I5031 Acute diastolic (congestive) heart failure: Secondary | ICD-10-CM

## 2018-06-17 LAB — BASIC METABOLIC PANEL
Anion gap: 8 (ref 5–15)
Anion gap: 8 (ref 5–15)
Anion gap: 9 (ref 5–15)
BUN: 68 mg/dL — ABNORMAL HIGH (ref 8–23)
BUN: 64 mg/dL — ABNORMAL HIGH (ref 8–23)
BUN: 65 mg/dL — ABNORMAL HIGH (ref 8–23)
CO2: 20 mmol/L — ABNORMAL LOW (ref 22–32)
CO2: 21 mmol/L — ABNORMAL LOW (ref 22–32)
CO2: 22 mmol/L (ref 22–32)
Calcium: 8.6 mg/dL — ABNORMAL LOW (ref 8.9–10.3)
Calcium: 8.7 mg/dL — ABNORMAL LOW (ref 8.9–10.3)
Calcium: 8.9 mg/dL (ref 8.9–10.3)
Chloride: 112 mmol/L — ABNORMAL HIGH (ref 98–111)
Chloride: 114 mmol/L — ABNORMAL HIGH (ref 98–111)
Chloride: 110 mmol/L (ref 98–111)
Creatinine, Ser: 2.47 mg/dL — ABNORMAL HIGH (ref 0.44–1.00)
Creatinine, Ser: 2.32 mg/dL — ABNORMAL HIGH (ref 0.44–1.00)
Creatinine, Ser: 2.37 mg/dL — ABNORMAL HIGH (ref 0.44–1.00)
GFR calc Af Amer: 21 mL/min — ABNORMAL LOW (ref >60)
GFR calc Af Amer: 23 mL/min — ABNORMAL LOW (ref >60)
GFR calc Af Amer: 22 mL/min — ABNORMAL LOW (ref >60)
GFR calc non Af Amer: 18 mL/min — ABNORMAL LOW (ref >60)
GFR calc non Af Amer: 20 mL/min — ABNORMAL LOW (ref >60)
GFR calc non Af Amer: 19 mL/min — ABNORMAL LOW (ref >60)
Glucose, Bld: 287 mg/dL — ABNORMAL HIGH (ref 70–99)
Glucose, Bld: 156 mg/dL — ABNORMAL HIGH (ref 70–99)
Glucose, Bld: 221 mg/dL — ABNORMAL HIGH (ref 70–99)
Potassium: 3.2 mmol/L — ABNORMAL LOW (ref 3.5–5.1)
Potassium: 4 mmol/L (ref 3.5–5.1)
Potassium: 3.6 mmol/L (ref 3.5–5.1)
Sodium: 140 mmol/L (ref 135–145)
Sodium: 143 mmol/L (ref 135–145)
Sodium: 141 mmol/L (ref 135–145)

## 2018-06-17 LAB — CBC
HCT: 22.6 % — ABNORMAL LOW (ref 36.0–46.0)
Hemoglobin: 7.5 g/dL — ABNORMAL LOW (ref 12.0–15.0)
MCH: 29.1 pg (ref 26.0–34.0)
MCHC: 33.2 g/dL (ref 30.0–36.0)
MCV: 87.6 fL (ref 80.0–100.0)
Platelets: 351 10*3/uL (ref 150–400)
RBC: 2.58 MIL/uL — ABNORMAL LOW (ref 3.87–5.11)
RDW: 14.8 % (ref 11.5–15.5)
WBC: 13.5 10*3/uL — ABNORMAL HIGH (ref 4.0–10.5)
nRBC: 0 % (ref 0.0–0.2)

## 2018-06-17 LAB — GLUCOSE, CAPILLARY
Glucose-Capillary: 239 mg/dL — ABNORMAL HIGH (ref 70–99)
Glucose-Capillary: 204 mg/dL — ABNORMAL HIGH (ref 70–99)
Glucose-Capillary: 371 mg/dL — ABNORMAL HIGH (ref 70–99)

## 2018-06-17 LAB — CBG MONITORING, ED
Glucose-Capillary: 478 mg/dL — ABNORMAL HIGH (ref 70–99)
Glucose-Capillary: 350 mg/dL — ABNORMAL HIGH (ref 70–99)
Glucose-Capillary: 261 mg/dL — ABNORMAL HIGH (ref 70–99)
Glucose-Capillary: 140 mg/dL — ABNORMAL HIGH (ref 70–99)
Glucose-Capillary: 148 mg/dL — ABNORMAL HIGH (ref 70–99)
Glucose-Capillary: 229 mg/dL — ABNORMAL HIGH (ref 70–99)

## 2018-06-17 LAB — BRAIN NATRIURETIC PEPTIDE: B Natriuretic Peptide: 194.2 pg/mL — ABNORMAL HIGH (ref 0.0–100.0)

## 2018-06-17 LAB — TROPONIN I
Troponin I: 0.08 ng/mL (ref <0.03)
Troponin I: 0.09 ng/mL (ref <0.03)

## 2018-06-17 MED ORDER — IPRATROPIUM-ALBUTEROL 0.5-2.5 (3) MG/3ML IN SOLN
3.0000 mL | Freq: Four times a day (QID) | RESPIRATORY_TRACT | Status: DC
  Administered 2018-06-17 (×2): 3 mL via RESPIRATORY_TRACT
  Filled 2018-06-17 (×2): qty 3

## 2018-06-17 MED ORDER — IPRATROPIUM-ALBUTEROL 0.5-2.5 (3) MG/3ML IN SOLN
3.0000 mL | Freq: Three times a day (TID) | RESPIRATORY_TRACT | Status: DC
  Administered 2018-06-17 – 2018-06-20 (×8): 3 mL via RESPIRATORY_TRACT
  Filled 2018-06-17 (×8): qty 3

## 2018-06-17 MED ORDER — SIMVASTATIN 20 MG PO TABS
20.0000 mg | ORAL_TABLET | Freq: Every day | ORAL | Status: DC
  Administered 2018-06-17 – 2018-06-20 (×4): 20 mg via ORAL
  Filled 2018-06-17 (×4): qty 1

## 2018-06-17 MED ORDER — POTASSIUM CHLORIDE 10 MEQ/100ML IV SOLN
10.0000 meq | INTRAVENOUS | Status: DC
  Administered 2018-06-17: 10 meq via INTRAVENOUS
  Filled 2018-06-17: qty 100

## 2018-06-17 MED ORDER — INSULIN ASPART PROT & ASPART (70-30 MIX) 100 UNIT/ML ~~LOC~~ SUSP: 20.0000 [IU] | Freq: Every day | SUBCUTANEOUS | Status: DC

## 2018-06-17 MED ORDER — FUROSEMIDE 10 MG/ML IJ SOLN: 60.0000 mg | Freq: Two times a day (BID) | INTRAMUSCULAR | Status: DC

## 2018-06-17 MED ORDER — PROPYLENE GLYCOL 0.6 % OP SOLN: 1.0000 [drp] | Freq: Two times a day (BID) | OPHTHALMIC | Status: DC | PRN

## 2018-06-17 MED ORDER — POTASSIUM CHLORIDE 20 MEQ PO PACK: 40.0000 meq | PACK | Freq: Once | ORAL | Status: DC

## 2018-06-17 MED ORDER — INSULIN ASPART PROT & ASPART (70-30 MIX) 100 UNIT/ML ~~LOC~~ SUSP
20.0000 [IU] | Freq: Every day | SUBCUTANEOUS | Status: DC
  Administered 2018-06-18 – 2018-06-19 (×2): 20 [IU] via SUBCUTANEOUS
  Filled 2018-06-17: qty 10

## 2018-06-17 MED ORDER — INSULIN GLARGINE 100 UNIT/ML ~~LOC~~ SOLN
20.0000 [IU] | Freq: Every day | SUBCUTANEOUS | Status: DC
  Administered 2018-06-17 – 2018-06-18 (×2): 20 [IU] via SUBCUTANEOUS
  Filled 2018-06-17 (×2): qty 0.2

## 2018-06-17 MED ORDER — FUROSEMIDE 10 MG/ML IJ SOLN
40.0000 mg | Freq: Two times a day (BID) | INTRAMUSCULAR | Status: DC
  Administered 2018-06-17 – 2018-06-20 (×7): 40 mg via INTRAVENOUS
  Filled 2018-06-17 (×7): qty 4

## 2018-06-17 MED ORDER — PROCHLORPERAZINE MALEATE 10 MG PO TABS: 5.0000 mg | ORAL_TABLET | Freq: Four times a day (QID) | ORAL | Status: DC | PRN

## 2018-06-17 MED ORDER — INSULIN ASPART 100 UNIT/ML ~~LOC~~ SOLN
0.0000 [IU] | Freq: Three times a day (TID) | SUBCUTANEOUS | Status: DC
  Administered 2018-06-17: 13:00:00 3 [IU] via SUBCUTANEOUS
  Administered 2018-06-17: 1 [IU] via SUBCUTANEOUS
  Administered 2018-06-17 – 2018-06-19 (×5): 3 [IU] via SUBCUTANEOUS
  Administered 2018-06-19: 5 [IU] via SUBCUTANEOUS
  Administered 2018-06-19 – 2018-06-20 (×2): 2 [IU] via SUBCUTANEOUS
  Administered 2018-06-20: 3 [IU] via SUBCUTANEOUS
  Filled 2018-06-17: qty 1

## 2018-06-17 MED ORDER — POTASSIUM CHLORIDE 20 MEQ PO PACK: 20.0000 meq | PACK | Freq: Once | ORAL | Status: DC

## 2018-06-17 MED ORDER — OMEGA-3-ACID ETHYL ESTERS 1 G PO CAPS
1.0000 g | ORAL_CAPSULE | Freq: Every day | ORAL | Status: DC
  Administered 2018-06-17 – 2018-06-20 (×4): 1 g via ORAL
  Filled 2018-06-17 (×4): qty 1

## 2018-06-17 MED ORDER — CALCITRIOL 0.25 MCG PO CAPS
0.2500 ug | ORAL_CAPSULE | ORAL | Status: DC
  Administered 2018-06-18 – 2018-06-20 (×2): 0.25 ug via ORAL
  Filled 2018-06-17 (×2): qty 1

## 2018-06-17 MED ORDER — VITAMIN B-12 1000 MCG PO TABS
1000.0000 ug | ORAL_TABLET | Freq: Every day | ORAL | Status: DC
  Administered 2018-06-17 – 2018-06-20 (×4): 1000 ug via ORAL
  Filled 2018-06-17 (×4): qty 1

## 2018-06-17 MED ORDER — MAGNESIUM OXIDE 400 (241.3 MG) MG PO TABS
400.0000 mg | ORAL_TABLET | Freq: Every day | ORAL | Status: DC
  Administered 2018-06-17 – 2018-06-20 (×4): 400 mg via ORAL
  Filled 2018-06-17 (×4): qty 1

## 2018-06-17 MED ORDER — VITAMIN D (ERGOCALCIFEROL) 1.25 MG (50000 UNIT) PO CAPS
50000.0000 [IU] | ORAL_CAPSULE | ORAL | Status: DC
  Administered 2018-06-18: 50000 [IU] via ORAL
  Filled 2018-06-17: qty 1

## 2018-06-17 MED ORDER — AMLODIPINE BESYLATE 10 MG PO TABS
10.0000 mg | ORAL_TABLET | Freq: Every day | ORAL | Status: DC
  Administered 2018-06-17 – 2018-06-20 (×4): 10 mg via ORAL
  Filled 2018-06-17 (×4): qty 1

## 2018-06-17 MED ORDER — FOLIC ACID 1 MG PO TABS
0.5000 mg | ORAL_TABLET | Freq: Every day | ORAL | Status: DC
  Administered 2018-06-17 – 2018-06-20 (×4): 0.5 mg via ORAL
  Filled 2018-06-17 (×4): qty 1

## 2018-06-17 MED ORDER — INSULIN ASPART 100 UNIT/ML ~~LOC~~ SOLN
0.0000 [IU] | Freq: Every day | SUBCUTANEOUS | Status: DC
  Administered 2018-06-17: 22:00:00 5 [IU] via SUBCUTANEOUS
  Administered 2018-06-18 – 2018-06-19 (×2): 4 [IU] via SUBCUTANEOUS

## 2018-06-17 MED ORDER — INSULIN ASPART PROT & ASPART (70-30 MIX) 100 UNIT/ML ~~LOC~~ SUSP
16.0000 [IU] | Freq: Every day | SUBCUTANEOUS | Status: DC
  Administered 2018-06-17: 16 [IU] via SUBCUTANEOUS
  Filled 2018-06-17 (×2): qty 10

## 2018-06-17 MED ORDER — TRAZODONE HCL 50 MG PO TABS: 50.0000 mg | ORAL_TABLET | Freq: Every evening | ORAL | Status: DC | PRN

## 2018-06-17 MED ORDER — POLYSACCHARIDE IRON COMPLEX 150 MG PO CAPS
150.0000 mg | ORAL_CAPSULE | Freq: Two times a day (BID) | ORAL | Status: DC
  Administered 2018-06-17 – 2018-06-20 (×6): 150 mg via ORAL
  Filled 2018-06-17 (×7): qty 1

## 2018-06-17 MED ORDER — POLYVINYL ALCOHOL 1.4 % OP SOLN
1.0000 [drp] | Freq: Two times a day (BID) | OPHTHALMIC | Status: DC | PRN
  Filled 2018-06-17: qty 15

## 2018-06-17 MED ORDER — MONTELUKAST SODIUM 10 MG PO TABS
10.0000 mg | ORAL_TABLET | Freq: Every day | ORAL | Status: DC
  Administered 2018-06-17 – 2018-06-19 (×3): 10 mg via ORAL
  Filled 2018-06-17 (×3): qty 1

## 2018-06-17 MED ORDER — CALCIUM CARBONATE-VITAMIN D 500-200 MG-UNIT PO TABS
1.0000 | ORAL_TABLET | Freq: Every day | ORAL | Status: DC
  Administered 2018-06-17 – 2018-06-20 (×4): 1 via ORAL
  Filled 2018-06-17 (×4): qty 1

## 2018-06-17 NOTE — ED Notes (Signed)
ED TO INPATIENT HANDOFF REPORT  Name/Age/Gender [name] 78 y.o. female  Code Status    Code Status Orders  (From admission, onward)         Start     Ordered   06/16/18 2235  Full code  Continuous     06/16/18 2237        Code Status History    Date Active Date Inactive Code Status Order ID Comments User Context   04/04/2017 0134 04/08/2017 1841 Full Code [id number]  Rise Patience, MD ED   11/22/2016 0915 12/02/2016 2209 Full Code [id number]  Rondel Jumbo, PA-C ED   02/04/2013 0144 02/07/2013 2320 Full Code [id number]  Geoffery Lyons, MD Inpatient      Home/SNF/Other Home  Chief Complaint Hyperglycemia  Level of Care/Admitting Diagnosis ED Disposition    ED Disposition Condition [facility] Area: [place] [[id number]]  Level of Care: Telemetry [5]  Admit to tele based on following criteria: Monitor QTC interval  Covid Evaluation: Confirmed COVID Negative  Diagnosis: Diabetic hyperosmolar non-ketotic state [facility]) [[id number]]  Admitting Physician: Guilford Shi [[id number]]  Attending Physician: Guilford Shi [[id number]]  Estimated length of stay: past midnight tomorrow  Certification:: I certify this patient will need inpatient services for at least 2 midnights  PT Class (Do Not Modify): Inpatient [101]  PT Acc Code (Do Not Modify): Private [1]       Medical History Past Medical History:  Diagnosis Date  . Adenomatous colon polyp   . Anemia   . Arthritis   . Asthma   . COPD (chronic obstructive pulmonary disease) ([place])   . 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 Depression   . Diabetes mellitus   . Diverticulosis   . Gastric ulcer   . GERD (gastroesophageal reflux disease)   . HTN (hypertension)   . Hypercholesterolemia   . Internal hemorrhoids   . Lumbar disc disease   . On home oxygen therapy    "2-3L @ bedtime" (04/04/2017)  . OSA (obstructive sleep apnea) 09/17/2012  . Peptic ulcer disease   . Renal  oncocytoma 2011   ablated by [place]  . Umbilical hernia   . Valgus foot    right     Allergies Allergies  Allergen Reactions  . Other Other (See Comments)    "all generics make her sick"    IV Location/Drains/Wounds Patient Lines/Drains/Airways Status   Active Line/Drains/Airways    Name:   Placement date:   Placement time:   Site:   Days:   Peripheral IV 06/16/18 Left Hand   06/16/18    -    Hand   1   External Urinary Catheter   04/03/17    2301    -   440          Labs/Imaging Results for orders placed or performed during the hospital encounter of 06/16/18 (from the past 48 hour(s))  CBG monitoring, ED     Status: Abnormal   Collection Time: 06/16/18  7:52 PM  Result Value Ref Range   Glucose-Capillary >600 (HH) 70 - 99 mg/dL  CBC     Status: Abnormal   Collection Time: 06/16/18  8:15 PM  Result Value Ref Range   WBC 14.7 (H) 4.0 - 10.5 K/uL   RBC 3.00 (L) 3.87 - 5.11 MIL/uL   Hemoglobin 8.7 (L) 12.0 - 15.0 g/dL   HCT 26.4 (L) 36.0 - 46.0 %   MCV 88.0 80.0 -  100.0 fL   MCH 29.0 26.0 - 34.0 pg   MCHC 33.0 30.0 - 36.0 g/dL   RDW 14.6 11.5 - 15.5 %   Platelets 433 (H) 150 - 400 K/uL   nRBC 0.0 0.0 - 0.2 %    Comment: Performed at [facility], [place] [address], [place], [place] [address]  Urinalysis, Routine w reflex microscopic     Status: Abnormal   Collection Time: 06/16/18  8:16 PM  Result Value Ref Range   Color, Urine YELLOW YELLOW   APPearance HAZY (A) CLEAR   Specific Gravity, Urine 1.011 1.005 - 1.030   pH 5.0 5.0 - 8.0   Glucose, UA >=500 (A) NEGATIVE mg/dL   Hgb urine dipstick MODERATE (A) NEGATIVE   Bilirubin Urine NEGATIVE NEGATIVE   Ketones, ur NEGATIVE NEGATIVE mg/dL   Protein, ur NEGATIVE NEGATIVE mg/dL   Nitrite NEGATIVE NEGATIVE   Leukocytes,Ua MODERATE (A) NEGATIVE   RBC / HPF 21-50 0 - 5 RBC/hpf   WBC, UA >50 (H) 0 - 5 WBC/hpf   Bacteria, UA MANY (A) NONE SEEN   Squamous Epithelial / LPF 0-5 0 - 5   Mucus PRESENT      Comment: Performed at [facility], [place] [address] [place], [place] [address]  Comprehensive metabolic panel     Status: Abnormal   Collection Time: 06/16/18  8:17 PM  Result Value Ref Range   Sodium 133 (L) 135 - 145 mmol/L   Potassium 3.6 3.5 - 5.1 mmol/L   Chloride 103 98 - 111 mmol/L   CO2 18 (L) 22 - 32 mmol/L   Glucose, Bld 800 (HH) 70 - 99 mg/dL    Comment: CRITICAL RESULT CALLED TO, READ BACK BY AND VERIFIED WITH: [facility] RN @ 2122 ON 06/16/2018 JACKSON,K    BUN 74 (H) 8 - 23 mg/dL   Creatinine, Ser 2.65 (H) 0.44 - 1.00 mg/dL   Calcium 8.9 8.9 - 10.3 mg/dL   Total Protein 7.6 6.5 - 8.1 g/dL   Albumin 2.9 (L) 3.5 - 5.0 g/dL   AST 12 (L) 15 - 41 U/L   ALT 16 0 - 44 U/L   Alkaline Phosphatase 115 38 - 126 U/L   Total Bilirubin 0.7 0.3 - 1.2 mg/dL   GFR calc non Af Amer 17 (L) >60 mL/min   GFR calc Af Amer 20 (L) >60 mL/min   Anion gap 12 5 - 15    Comment: Performed at [facility], [place] [address], [place], [place] [address]  Troponin I - Once     Status: Abnormal   Collection Time: 06/16/18  8:17 PM  Result Value Ref Range   Troponin I 0.09 (HH) <0.03 ng/mL    Comment: CRITICAL RESULT CALLED TO, READ BACK BY AND VERIFIED WITH: [facility] RN @2122  ON 06/16/2018 JACKSON,K Performed at [facility], [place] [address] [place], [place] [address]   Lactic acid, plasma     Status: None   Collection Time: 06/16/18  8:20 PM  Result Value Ref Range   Lactic Acid, Venous 1.2 0.5 - 1.9 mmol/L    Comment: Performed at [facility], [place] [address], [place],  [address]  Blood gas, venous     Status: Abnormal   Collection Time: 06/16/18  9:25 PM  Result Value Ref Range   FIO2 21.00    Delivery systems ROOM AIR    pH, Ven 7.365 7.250 - 7.430   pCO2, Ven 33.7 (  L) 44.0 - 60.0 mmHg   pO2, Ven 50.3 (H) 32.0 - 45.0 mmHg   Bicarbonate 18.8 (L) 20.0 - 28.0 mmol/L   Acid-base deficit 5.4 (H) 0.0 - 2.0  mmol/L   O2 Saturation 81.7 %   Patient temperature 98.6    Collection site VEIN    Drawn by COLLECTED BY NURSE    Sample type VENOUS     Comment: Performed at [facility], [place] [address], [place], [place] [address]  SARS Coronavirus 2 (CEPHEID- Performed in [place] hospital lab), Hosp Order     Status: None   Collection Time: 06/16/18  9:28 PM  Result Value Ref Range   SARS Coronavirus 2 NEGATIVE NEGATIVE    Comment: (NOTE) If result is NEGATIVE SARS-CoV-2 target nucleic acids are NOT DETECTED. The SARS-CoV-2 RNA is generally detectable in upper and lower  respiratory specimens during the acute phase of infection. The lowest  concentration of SARS-CoV-2 viral copies this assay can detect is 250  copies / mL. A negative result does not preclude SARS-CoV-2 infection  and should not be used as the sole basis for treatment or other  patient management decisions.  A negative result may occur with  improper specimen collection / handling, submission of specimen other  than nasopharyngeal swab, presence of viral mutation(s) within the  areas targeted by this assay, and inadequate number of viral copies  (<250 copies / mL). A negative result must be combined with clinical  observations, patient history, and epidemiological information. If result is POSITIVE SARS-CoV-2 target nucleic acids are DETECTED. The SARS-CoV-2 RNA is generally detectable in upper and lower  respiratory specimens dur ing the acute phase of infection.  Positive  results are indicative of active infection with SARS-CoV-2.  Clinical  correlation with patient history and other diagnostic information is  necessary to determine patient infection status.  Positive results do  not rule out bacterial infection or co-infection with other viruses. If result is PRESUMPTIVE POSTIVE SARS-CoV-2 nucleic acids MAY BE PRESENT.   A presumptive positive result was obtained on the submitted specimen  and  confirmed on repeat testing.  While 2019 novel coronavirus  (SARS-CoV-2) nucleic acids may be present in the submitted sample  additional confirmatory testing may be necessary for epidemiological  and / or clinical management purposes  to differentiate between  SARS-CoV-2 and other Sarbecovirus currently known to infect humans.  If clinically indicated additional testing with an alternate test  methodology [phone]) is advised. The SARS-CoV-2 RNA is generally  detectable in upper and lower respiratory sp ecimens during the acute  phase of infection. The expected result is Negative. Fact Sheet for Patients:  StrictlyIdeas.no Fact Sheet for Healthcare Providers: BankingDealers.co.za This test is not yet approved or cleared by the [place] FDA and has been authorized for detection and/or diagnosis of SARS-CoV-2 by FDA under an Emergency Use Authorization (EUA).  This EUA will remain in effect (meaning this test can be used) for the duration of the COVID-19 declaration under Section 564(b)(1) of the Act, 21 U.S.C. section 360bbb-3(b)(1), unless the authorization is terminated or revoked sooner. Performed at [facility], [place] [address] [place], [place] [address]   CBG monitoring, ED     Status: Abnormal   Collection Time: 06/16/18 10:46 PM  Result Value Ref Range   Glucose-Capillary >600 (HH) 70 - 99 mg/dL  CBG monitoring, ED     Status: Abnormal   Collection Time: 06/16/18 11:52 PM  Result Value Ref  Range   Glucose-Capillary 576 (HH) 70 - 99 mg/dL   Comment 1 Notify RN   Troponin I - Now Then Q6H     Status: Abnormal   Collection Time: 06/17/18 12:10 AM  Result Value Ref Range   Troponin I 0.08 (HH) <0.03 ng/mL    Comment: CRITICAL VALUE NOTED.  VALUE IS CONSISTENT WITH PREVIOUSLY REPORTED AND CALLED VALUE. Performed at [facility], [place] [address], [place], [place] [address]   CBG  monitoring, ED     Status: Abnormal   Collection Time: 06/17/18  1:08 AM  Result Value Ref Range   Glucose-Capillary 478 (H) 70 - 99 mg/dL  CBG monitoring, ED     Status: Abnormal   Collection Time: 06/17/18  2:14 AM  Result Value Ref Range   Glucose-Capillary 350 (H) 70 - 99 mg/dL  CBG monitoring, ED     Status: Abnormal   Collection Time: 06/17/18  3:25 AM  Result Value Ref Range   Glucose-Capillary 261 (H) 70 - 99 mg/dL  CBC     Status: Abnormal   Collection Time: 06/17/18  3:26 AM  Result Value Ref Range   WBC 13.5 (H) 4.0 - 10.5 K/uL   RBC 2.58 (L) 3.87 - 5.11 MIL/uL   Hemoglobin 7.5 (L) 12.0 - 15.0 g/dL   HCT 22.6 (L) 36.0 - 46.0 %   MCV 87.6 80.0 - 100.0 fL   MCH 29.1 26.0 - 34.0 pg   MCHC 33.2 30.0 - 36.0 g/dL   RDW 14.8 11.5 - 15.5 %   Platelets 351 150 - 400 K/uL   nRBC 0.0 0.0 - 0.2 %    Comment: Performed at [facility], [place] [address] [place], [place] [address]  Basic metabolic panel     Status: Abnormal   Collection Time: 06/17/18  3:26 AM  Result Value Ref Range   Sodium 140 135 - 145 mmol/L    Comment: DELTA CHECK NOTED   Potassium 3.2 (L) 3.5 - 5.1 mmol/L   Chloride 112 (H) 98 - 111 mmol/L   CO2 20 (L) 22 - 32 mmol/L   Glucose, Bld 287 (H) 70 - 99 mg/dL   BUN 68 (H) 8 - 23 mg/dL   Creatinine, Ser 2.47 (H) 0.44 - 1.00 mg/dL   Calcium 8.6 (L) 8.9 - 10.3 mg/dL   GFR calc non Af Amer 18 (L) >60 mL/min   GFR calc Af Amer 21 (L) >60 mL/min   Anion gap 8 5 - 15    Comment: Performed at [facility], [place] [address], [place], [place] [address]  Troponin I - Now Then Q6H     Status: Abnormal   Collection Time: 06/17/18  3:26 AM  Result Value Ref Range   Troponin I 0.09 (HH) <0.03 ng/mL    Comment: CRITICAL VALUE NOTED.  VALUE IS CONSISTENT WITH PREVIOUSLY REPORTED AND CALLED VALUE. Performed at [facility], [place] [address], [place],  [address]   CBG monitoring, ED     Status: Abnormal    Collection Time: 06/17/18  5:50 AM  Result Value Ref Range   Glucose-Capillary 140 (H) 70 - 99 mg/dL   Comment 1 Notify RN   CBG monitoring, ED     Status: Abnormal   Collection Time: 06/17/18  7:11 AM  Result Value Ref Range   Glucose-Capillary 148 (H) 70 - 99 mg/dL  Basic metabolic panel     Status: Abnormal   Collection Time:  06/17/18  7:40 AM  Result Value Ref Range   Sodium 143 135 - 145 mmol/L   Potassium 4.0 3.5 - 5.1 mmol/L    Comment: DELTA CHECK NOTED   Chloride 114 (H) 98 - 111 mmol/L   CO2 21 (L) 22 - 32 mmol/L   Glucose, Bld 156 (H) 70 - 99 mg/dL   BUN 64 (H) 8 - 23 mg/dL   Creatinine, Ser 2.32 (H) 0.44 - 1.00 mg/dL   Calcium 8.7 (L) 8.9 - 10.3 mg/dL   GFR calc non Af Amer 20 (L) >60 mL/min   GFR calc Af Amer 23 (L) >60 mL/min   Anion gap 8 5 - 15    Comment: Performed at [facility], [place] [address], [place], [place] [address]  CBG monitoring, ED     Status: Abnormal   Collection Time: 06/17/18 10:10 AM  Result Value Ref Range   Glucose-Capillary 229 (H) 70 - 99 mg/dL   Dg Chest Port 1 View  Result Date: 06/16/2018 CLINICAL DATA:  Weakness 1 week. Swelling bilateral lower extremities. EXAM: PORTABLE CHEST 1 VIEW COMPARISON:  04/03/2017 FINDINGS: Patient is rotated to the left. Lungs are adequately inflated with subtle hazy opacification of the central pulmonary vasculature likely mild vascular congestion which is improved. No evidence of effusion or lobar consolidation. Mild cardiomegaly. Remainder of the exam is unchanged. IMPRESSION: Mild cardiomegaly with interval improved mild vascular congestion. Electronically Signed   By: Marin Olp M.D.   On: 06/16/2018 21:01    Pending Labs Unresulted Labs (From admission, onward)    Start     Ordered   06/17/18 6045  Basic metabolic panel  Once,   R     06/17/18 0909   06/16/18 2237  Hemoglobin A1c  Once,   R     06/16/18 2237   06/16/18 2018  Urine culture  ONCE - STAT,   STAT     06/16/18 2018           Vitals/Pain Today's Vitals   06/17/18 0630 06/17/18 0700 06/17/18 0900 06/17/18 1000  BP: (!) 148/64 (!) 148/75 (!) 151/59 133/67  Pulse: (!) 54 63 74 68  Resp: (!) 21 (!) 21 18 (!) 24  Temp:      TempSrc:      SpO2: 95% 98% 95% 94%  Weight:      Height:      PainSc:        Isolation Precautions No active isolations  Medications Medications  heparin injection 5,000 Units (5,000 Units Subcutaneous Given 06/17/18 4098)  acetaminophen (TYLENOL) tablet 650 mg (650 mg Oral Given 06/17/18 0003)    Or  acetaminophen (TYLENOL) suppository 650 mg ( Rectal See Alternative 06/17/18 0003)  cefTRIAXone (ROCEPHIN) 1 g in sodium chloride 0.9 % 100 mL IVPB (1 g Intravenous New Bag/Given 06/17/18 0616)  hydrALAZINE (APRESOLINE) injection 5 mg (has no administration in time range)  ipratropium-albuterol (DUONEB) 0.5-2.5 (3) MG/3ML nebulizer solution 3 mL (has no administration in time range)  insulin aspart (novoLOG) injection 0-9 Units (1 Units Subcutaneous Given 06/17/18 0749)  insulin aspart (novoLOG) injection 0-5 Units (has no administration in time range)  insulin aspart protamine- aspart (NOVOLOG MIX 70/30) injection 16 Units (16 Units Subcutaneous Given 06/17/18 0854)  ipratropium-albuterol (DUONEB) 0.5-2.5 (3) MG/3ML nebulizer solution 3 mL (3 mLs Nebulization Given 06/17/18 0928)  furosemide (LASIX) injection 40 mg (40 mg Intravenous Given 06/17/18 0928)  piperacillin-tazobactam (ZOSYN) IVPB 3.375 g (0 g Intravenous Stopped 06/16/18 2247)  potassium chloride SA (K-DUR) CR tablet 40 mEq (40 mEq Oral Given 06/17/18 0003)

## 2018-06-17 NOTE — Progress Notes (Addendum)
PROGRESS NOTE    [name]  STM:[id number]  DOB: Jul 18, 1940  DOA: 06/16/2018 PCP: Burnard Bunting, MD  Brief Narrative: 78 y/o female with history of diastolic CHF, CKD stage IV, hypertension, hyperlipidemia, COPD on 2 L home O2, GERD, obstructive sleep apnea, chronic venous stasis, diabetes mellitus type 2 on home insulin regimen of Humalog 70/2520 units every morning and Lantus 20 units at bedtime presents with complaints of generalized weakness, worsening leg swellings and some shortness of breath.  She was noted to have blood glucose of 800 in the ED with bicarb of 18 (17 -22 at baseline in the setting of CKD) without urine ketones or anion gap or acidosis on ABG.  Patient admitted with glucose stabilizer.  She received 40 mEq p.o. potassium for hypokalemia. Subjective:Patient still in the ED as no stepdown bed available.  Blood glucose down to 140s this morning.  She appears somewhat dyspneic while talking full sentences but reports feeling better and saturating well on 2 L nasal cannula.  Objective: Vitals:   06/17/18 0630 06/17/18 0700 06/17/18 0900 06/17/18 1000  BP: (!) 148/64 (!) 148/75 (!) 151/59 133/67  Pulse: (!) 54 63 74 68  Resp: (!) 21 (!) 21 18 (!) 24  Temp:      TempSrc:      SpO2: 95% 98% 95% 94%  Weight:      Height:        Intake/Output Summary (Last 24 hours) at 06/17/2018 1059 Last data filed at 06/17/2018 0614 Gross per 24 hour  Intake 903.56 ml  Output -  Net 903.56 ml   Filed Weights   06/16/18 2135  Weight: 90.7 kg    Physical Examination:  General exam: Appears calm and comfortable  Respiratory system: Clear to auscultation. Respiratory effort normal. Cardiovascular system: S1 & S2 heard, RRR. No JVD, murmurs, rubs, gallops or clicks. Gastrointestinal system: Abdomen is nondistended, soft and nontender. No organomegaly or masses felt. Normal bowel sounds heard. Central nervous system: Alert and oriented. No focal neurological deficits.  Extremities: Acute on chronic venous stasis edema with stasis dermatitis, chronic valgus deformity in bilateral feet. Skin: No rashes, lesions or ulcers Psychiatry: Judgement and insight appear normal. Mood & affect appropriate.     Data Reviewed: I have personally reviewed following labs and imaging studies  CBC: Recent Labs  Lab 06/16/18 2015 06/17/18 0326  WBC 14.7* 13.5*  HGB 8.7* 7.5*  HCT 26.4* 22.6*  MCV 88.0 87.6  PLT 433* 892   Basic Metabolic Panel: Recent Labs  Lab 06/16/18 2017 06/17/18 0326 06/17/18 0740  NA 133* 140 143  K 3.6 3.2* 4.0  CL 103 112* 114*  CO2 18* 20* 21*  GLUCOSE 800* 287* 156*  BUN 74* 68* 64*  CREATININE 2.65* 2.47* 2.32*  CALCIUM 8.9 8.6* 8.7*   GFR: Estimated Creatinine Clearance: 22 mL/min (A) (by C-G formula based on SCr of 2.32 mg/dL (H)). Liver Function Tests: Recent Labs  Lab 06/16/18 2017  AST 12*  ALT 16  ALKPHOS 115  BILITOT 0.7  PROT 7.6  ALBUMIN 2.9*   No results for input(s): LIPASE, AMYLASE in the last 168 hours. No results for input(s): AMMONIA in the last 168 hours. Coagulation Profile: No results for input(s): INR, PROTIME in the last 168 hours. Cardiac Enzymes: Recent Labs  Lab 06/16/18 2017 06/17/18 0010 06/17/18 0326  TROPONINI 0.09* 0.08* 0.09*   BNP (last 3 results) No results for input(s): PROBNP in the last 8760 hours. HbA1C: No results for input(s):  HGBA1C in the last 72 hours. CBG: Recent Labs  Lab 06/17/18 0214 06/17/18 0325 06/17/18 0550 06/17/18 0711 06/17/18 1010  GLUCAP 350* 261* 140* 148* 229*   Lipid Profile: No results for input(s): CHOL, HDL, LDLCALC, TRIG, CHOLHDL, LDLDIRECT in the last 72 hours. Thyroid Function Tests: No results for input(s): TSH, T4TOTAL, FREET4, T3FREE, THYROIDAB in the last 72 hours. Anemia Panel: No results for input(s): VITAMINB12, FOLATE, FERRITIN, TIBC, IRON, RETICCTPCT in the last 72 hours. Sepsis Labs: Recent Labs  Lab 06/16/18 2020   LATICACIDVEN 1.2    Recent Results (from the past 240 hour(s))  SARS Coronavirus 2 (CEPHEID- Performed in [facility] hospital lab), Hosp Order     Status: None   Collection Time: 06/16/18  9:28 PM  Result Value Ref Range Status   SARS Coronavirus 2 NEGATIVE NEGATIVE Final    Comment: (NOTE) If result is NEGATIVE SARS-CoV-2 target nucleic acids are NOT DETECTED. The SARS-CoV-2 RNA is generally detectable in upper and lower  respiratory specimens during the acute phase of infection. The lowest  concentration of SARS-CoV-2 viral copies this assay can detect is 250  copies / mL. A negative result does not preclude SARS-CoV-2 infection  and should not be used as the sole basis for treatment or other  patient management decisions.  A negative result may occur with  improper specimen collection / handling, submission of specimen other  than nasopharyngeal swab, presence of viral mutation(s) within the  areas targeted by this assay, and inadequate number of viral copies  (<250 copies / mL). A negative result must be combined with clinical  observations, patient history, and epidemiological information. If result is POSITIVE SARS-CoV-2 target nucleic acids are DETECTED. The SARS-CoV-2 RNA is generally detectable in upper and lower  respiratory specimens dur ing the acute phase of infection.  Positive  results are indicative of active infection with SARS-CoV-2.  Clinical  correlation with patient history and other diagnostic information is  necessary to determine patient infection status.  Positive results do  not rule out bacterial infection or co-infection with other viruses. If result is PRESUMPTIVE POSTIVE SARS-CoV-2 nucleic acids MAY BE PRESENT.   A presumptive positive result was obtained on the submitted specimen  and confirmed on repeat testing.  While 2019 novel coronavirus  (SARS-CoV-2) nucleic acids may be present in the submitted sample  additional confirmatory testing may be  necessary for epidemiological  and / or clinical management purposes  to differentiate between  SARS-CoV-2 and other Sarbecovirus currently known to infect humans.  If clinically indicated additional testing with an alternate test  methodology [phone]) is advised. The SARS-CoV-2 RNA is generally  detectable in upper and lower respiratory sp ecimens during the acute  phase of infection. The expected result is Negative. Fact Sheet for Patients:  StrictlyIdeas.no Fact Sheet for Healthcare Providers: BankingDealers.co.za This test is not yet approved or cleared by the [place] FDA and has been authorized for detection and/or diagnosis of SARS-CoV-2 by FDA under an Emergency Use Authorization (EUA).  This EUA will remain in effect (meaning this test can be used) for the duration of the COVID-19 declaration under Section 564(b)(1) of the Act, 21 U.S.C. section 360bbb-3(b)(1), unless the authorization is terminated or revoked sooner. Performed at [facility], [place] [address], [place], [address] [place] [address]       Radiology Studies: Dg Chest Port 1 View  Result Date: 06/16/2018 CLINICAL DATA:  Weakness 1 week. Swelling bilateral lower extremities. EXAM: PORTABLE CHEST 1 VIEW  COMPARISON:  04/03/2017 FINDINGS: Patient is rotated to the left. Lungs are adequately inflated with subtle hazy opacification of the central pulmonary vasculature likely mild vascular congestion which is improved. No evidence of effusion or lobar consolidation. Mild cardiomegaly. Remainder of the exam is unchanged. IMPRESSION: Mild cardiomegaly with interval improved mild vascular congestion. Electronically Signed   By: Marin Olp M.D.   On: 06/16/2018 21:01        Scheduled Meds: . furosemide  40 mg Intravenous BID  . heparin  5,000 Units Subcutaneous Q8H  . insulin aspart  0-5 Units Subcutaneous QHS  . insulin aspart  0-9 Units Subcutaneous TID  WC  . insulin aspart protamine- aspart  16 Units Subcutaneous Q breakfast  . ipratropium-albuterol  3 mL Nebulization QID   Continuous Infusions: . cefTRIAXone (ROCEPHIN)  IV 1 g (06/17/18 7124)    Assessment & Plan:    1.  Hyperosmolar nonketotic hyperglycemia: Unclear precipitating factor as patient denies any recent illness, steroid use or diet/medication noncompliance although noted to have abnormal UA.  Patient's blood glucose was 800 on presentation.  Now improved to less than 200.  Insulin drip was discontinued earlier this morning.  Initiated patient's home insulin regimen sliding scale insulin.  2.  Hypokalemia: Repeat level improved at 4.  Will check again this afternoon and replace as needed.  3. UTI: Admitted with IV Rocephin, received 1 dose of Zosyn in the ED.  Follow-up urine cultures.  Leukocytosis improving  4.  Worsening leg swellings and dyspnea: Check BNP.  Chest x-ray suggestive of CHF.  Patient takes p.o. torsemide at baseline (?  40 mg every morning and 30 mg every evening).  Will give IV Lasix while here.  Monitor I's and O's, daily weights, potassium level and renal function.  Neb treatments  5.  COPD, chronic hypoxic respiratory failure: Stable on 2 L nasal cannula.  Continue neb as needed will add scheduled neb treatments x1 day  6.  Chronic kidney disease: Creatinine stable around 2.4-2.6.  Resume home medications  7.  Hypertension/hyperlipidemia: Resume home medications  8.  Elevated troponins: In the setting of chronic kidney disease.  No complaints of chest pain or EKG abnormalities.  9.  Sacral decubitus: Stable with no signs of infection.  Wound care consult  DVT prophylaxis: Heparin Code Status: Full code Family / Patient Communication: Discussed with patient Disposition Plan: Home when medically stable Will downgrade to telemetry status as now off insulin drip and electrolyte abnormalities improving.    LOS: 1 day    Time spent: 35 minutes     Guilford Shi, MD [facility] Pager [phone]  If 7PM-7AM, please contact night-coverage [email or web address] Password [email or web address] 06/17/2018, 10:59 AM

## 2018-06-17 NOTE — Progress Notes (Signed)
Patient has yellow MEWs at this time due to elevated respiratory rate (30/min). Patient has had RR as high as low 30's this admission and did come in with shortness of breath.  She reports no increased work of breathing compared to when she was first admitted.  On call hospitalist notified.Roderick Pee MEWS Guidelines - (patients age 77 and over)  Red - At High Risk for Deterioration Yellow - At risk for Deterioration  1. Go to room and assess patient 2. Validate data. Is this patient's baseline? If data confirmed: 3. Is this an acute change? 4. Administer prn meds/treatments as ordered. 5. Note Sepsis score 6. Review goals of care 7. Sports coach, RRT nurse and Provider. 8. Ask Provider to come to bedside.  9. Document patient condition/interventions/response. 10. Increase frequency of vital signs and focused assessments to at least q15 minutes x 4, then q30 minutes x2. - If stable, then q1h x3, then q4h x3 and then q8h or dept. routine. - If unstable, contact Provider & RRT nurse. Prepare for possible transfer. 11. Add entry in progress notes using the smart phrase ".MEWS". 1. Go to room and assess patient 2. Validate data. Is this patient's baseline? If data confirmed: 3. Is this an acute change? 4. Administer prn meds/treatments as ordered? 5. Note Sepsis score 6. Review goals of care 7. Sports coach and Provider 8. Call RRT nurse as needed. 9. Document patient condition/interventions/response. 10. Increase frequency of vital signs and focused assessments to at least q2h x2. - If stable, then q4h x2 and then q8h or dept. routine. - If unstable, contact Provider & RRT nurse. Prepare for possible transfer. 11. Add entry in progress notes using the smart phrase ".MEWS".  Green - Likely stable Lavender - Comfort Care Only  1. Continue routine/ordered monitoring.  2. Review goals of care. 1. Continue routine/ordered monitoring. 2. Review goals of care.

## 2018-06-17 NOTE — Progress Notes (Signed)
Pt states that she is unable to tolerate CPAP QHS and only uses O2 at home. Pt doesn't want to use one while here in hospital.  RT to monitor and assess as needed.

## 2018-06-18 LAB — GLUCOSE, CAPILLARY
Glucose-Capillary: 238 mg/dL — ABNORMAL HIGH (ref 70–99)
Glucose-Capillary: 203 mg/dL — ABNORMAL HIGH (ref 70–99)
Glucose-Capillary: 225 mg/dL — ABNORMAL HIGH (ref 70–99)
Glucose-Capillary: 312 mg/dL — ABNORMAL HIGH (ref 70–99)

## 2018-06-18 LAB — BASIC METABOLIC PANEL
Anion gap: 8 (ref 5–15)
BUN: 60 mg/dL — ABNORMAL HIGH (ref 8–23)
CO2: 21 mmol/L — ABNORMAL LOW (ref 22–32)
Calcium: 8.5 mg/dL — ABNORMAL LOW (ref 8.9–10.3)
Chloride: 111 mmol/L (ref 98–111)
Creatinine, Ser: 2.42 mg/dL — ABNORMAL HIGH (ref 0.44–1.00)
GFR calc Af Amer: 22 mL/min — ABNORMAL LOW (ref >60)
GFR calc non Af Amer: 19 mL/min — ABNORMAL LOW (ref >60)
Glucose, Bld: 274 mg/dL — ABNORMAL HIGH (ref 70–99)
Potassium: 3.6 mmol/L (ref 3.5–5.1)
Sodium: 140 mmol/L (ref 135–145)

## 2018-06-18 LAB — HEMOGLOBIN A1C
Hgb A1c MFr Bld: 9.9 % — ABNORMAL HIGH (ref 4.8–5.6)
Mean Plasma Glucose: 237 mg/dL

## 2018-06-18 MED ORDER — SODIUM CHLORIDE 0.9 % IV SOLN
INTRAVENOUS | Status: DC | PRN
  Administered 2018-06-18: 250 mL via INTRAVENOUS

## 2018-06-18 NOTE — Consult Note (Signed)
[place] Nurse wound consult note Reason for Consult: multiple wounds Wound type: Incontinence associated skin damage bilateral buttocks in combination with pressure over the upper buttocks/possible sheer injury. Patient reports she slides over from her chair at home  Fissure in the apex of the gluteal cleft most likely related to moisture  Venous ulcerations x 2 on the left lateral malleolar region  Venous ulcerations x 2 on the right lateral malleolar region  Pressure Injury POA: Yes  Measurement: Scattered areas 1cm x1cm over the upper left and right buttock; 100% pink and clean Fissure: 0.3cm x 0.1cm x 0.1cm; 100% clean and pink Left lateral malleolus: 2.5cm x 2.0cm x0.1cm and 1cm x 1cm x 0.1cm; 100% pink Right lateral malleolus: 1cm x 1cm x 0.1cm and 2cm x 2cm x 0.1cm; 100% clean/pink Wound bed: see above  Drainage (amount, consistency, odor) minimal, no odor Periwound:  Intact, evidence of prior edema, she reports that she wears compression stockings at home  Dressing procedure/placement/frequency:  Silver hydrofiber to the venous ulcerations, cover with foam. Unna's boots for compression therapy.  Silicone foam to the buttock/gluteal cleft  Will need HHRN for Unna's boot changes and wound care.  Patient agreeable to this.   Discussed POC with patient and bedside nurse.  Re consult if needed, will not follow at this time. Thanks  Kaneesha Constantino [employer], RN,CWOCN, CNS, [place] [phone])

## 2018-06-18 NOTE — Progress Notes (Signed)
Inpatient Diabetes Program Recommendations  AACE/ADA: New Consensus Statement on Inpatient Glycemic Control (2015)  Target Ranges:  Prepandial:   less than 140 mg/dL      Peak postprandial:   less than 180 mg/dL (1-2 hours)      Critically ill patients:  140 - 180 mg/dL   Results for [name], [name] (MRN [id number]) as of 06/18/2018 14:29  Ref. Range 06/17/2018 07:11 06/17/2018 10:10 06/17/2018 12:32 06/17/2018 16:31 06/17/2018 22:18  Glucose-Capillary Latest Ref Range: 70 - 99 mg/dL 148 (H)  1 unit NOVOLOG +  16 units 70/30 Insulin  229 (H) 239 (H)  3 units NOVOLOG  204 (H)  3 units NOVOLOG  371 (H)  5 units NOVOLOG +  20 units LANTUS   Results for [name], [name] (MRN [id number]) as of 06/18/2018 14:29  Ref. Range 06/18/2018 07:30 06/18/2018 11:39  Glucose-Capillary Latest Ref Range: 70 - 99 mg/dL 238 (H)  3 units NOVOLOG +  20 units 70/30 Insulin  203 (H)  3 units NOVOLOG     Admit Hyperglycemia (glucose 800 on admit)/ UTI/ LE Swelling  History: DM, CHF,COPD, CKD4  Home DM Meds: Lantus 20 units QHS        Humalog 75/25 Insulin 20 units daily  Current Orders: Lantus 20 units QHS      70/30 Insulin 20 units QAM      Novolog Sensitive Correction Scale/ SSI (0-9 units) TID AC + HS    PCP: Dr. Reynaldo Minium      Note 70/30 Insulin dose increased this AM (4 unit increase).  CBGs low 200s today.    MD- Please consider the following in-hospital insulin adjustments:  1. Increase Lantus to 25 units QHS  2. Increase Novolog SSi to Moderate scale (0-15 units) TID AC + HS      --Will follow patient during hospitalization--  Wyn Quaker RN, MSN, CDE Diabetes Coordinator Inpatient Glycemic Control Team Team Pager: [phone] (8a-5p)

## 2018-06-18 NOTE — NC FL2 (Signed)
[place] LEVEL OF CARE SCREENING TOOL     IDENTIFICATION  Patient Name: [name] [facility] Birthdate: 03/16/1940 Sex: female Admission Date (Current Location): 06/16/2018  [facility] and [place] Number:  Herbalist and Address:  [facility],  [place] [address] [place]      Provider Number: [id number]  Attending Physician Name and Address:  Guilford Shi, MD  Relative Name and Phone Number:       Current Level of Care: Hospital Recommended Level of Care: [place] Prior Approval Number:    Date Approved/Denied:   PASRR Number: [id number]  Discharge Plan: SNF    Current Diagnoses: Patient Active Problem List   Diagnosis Date Noted  . Diabetic hyperosmolar non-ketotic state ([place]) 06/17/2018  . Hyperglycemia 06/16/2018  . Type 2 diabetes mellitus ([place]) 06/16/2018  . UTI (urinary tract infection) 06/16/2018  . Sacral decubitus ulcer 06/16/2018  . Acute respiratory failure with hypoxia ([place]) 04/04/2017  . Hypertensive urgency 04/04/2017  . CKD (chronic kidney disease) stage 4, GFR 15-29 ml/min (HCC) 04/04/2017  . Hypoglycemia   . Acute renal failure superimposed on stage 3 chronic kidney disease ([place]) 11/23/2016  . Metabolic acidosis with normal anion gap and bicarbonate losses 11/23/2016  . Acute exacerbation of CHF (congestive heart failure) ([place]) 11/22/2016  . Acute on chronic respiratory failure with hypoxia ([place]) 11/22/2016  . Midfoot ulcer, left, limited to breakdown of skin ([place]) 06/13/2016  . Idiopathic chronic venous hypertension of both lower extremities with inflammation 03/10/2016  . Onychomycosis 03/10/2016  . Chronic obstructive pulmonary disease ([place]) 11/07/2013  . Upper airway cough syndrome 11/07/2013  . Insomnia 12/10/2012  . OSA (obstructive sleep apnea) 09/17/2012  . Benign neoplasm of colon 08/14/2012  . Rectal bleeding 08/13/2012    Class: Acute  . Anemia 08/13/2012    Class: Acute  .  Osteoarthritis 08/13/2012    Class: Chronic  . Acute posthemorrhagic anemia 08/13/2012  . Diverticulosis of colon (without mention of hemorrhage) 08/13/2012  . Hoarseness 07/20/2011  . 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 Allergic rhinitis 04/20/2011  . GI bleed 02/08/2011  . Personal history of colonic polyps 04/16/2010  . Fecal incontinence 03/23/2010  . Change in bowel habits 03/23/2010    Orientation RESPIRATION BLADDER Height & Weight     Self, Time, Situation, Place  Normal Incontinent Weight: 90.7 kg Height:  5\' 3"  (160 cm)  BEHAVIORAL SYMPTOMS/MOOD NEUROLOGICAL BOWEL NUTRITION STATUS      Continent Diet  AMBULATORY STATUS COMMUNICATION OF NEEDS Skin   Extensive Assist Verbally PU Stage and Appropriate Care, Other (Comment)(Unna boot)   PU Stage 2 Dressing: (2x a week)                   Personal Care Assistance Level of Assistance  Bathing, Dressing Bathing Assistance: Maximum assistance   Dressing Assistance: Maximum assistance     Functional Limitations Info             SPECIAL CARE FACTORS FREQUENCY  PT (By licensed PT), OT (By licensed OT)     PT Frequency: 5x a week OT Frequency: 5x a week            Contractures      Additional Factors Info  Code Status, Allergies Code Status Info: Full Allergies Info: other-all generics make her sick           Current Medications (06/18/2018):  This is the current hospital active medication list Current Facility-Administered Medications  Medication Dose Route Frequency Provider Last Rate Last Dose  . 0.9 %  sodium chloride infusion   Intravenous PRN Gu[place] Shi, MD 10 mL/hr at 06/18/18 0558 250 mL at 06/18/18 0558  . acetaminophen (TYLENOL) tablet 650 mg  650 mg Oral Q6H PRN Shela Leff, MD   650 mg at 06/17/18 0003   Or  . acetaminophen (TYLENOL) suppository 650 mg  650 mg Rectal Q6H PRN Shela Leff, MD      . amLODipine (NORVASC) tablet 10 mg  10 mg Oral Daily Guilford Shi,  MD   10 mg at 06/18/18 1012  . calcitRIOL (ROCALTROL) capsule 0.25 mcg  0.25 mcg Oral Q M,W,F Guilford Shi, MD   0.25 mcg at 06/18/18 1012  . calcium-vitamin D (OSCAL WITH D) 500-200 MG-UNIT per tablet 1 tablet  1 tablet Oral Q breakfast Guilford Shi, MD   1 tablet at 06/18/18 0804  . cefTRIAXone (ROCEPHIN) 1 g in sodium chloride 0.9 % 100 mL IVPB  1 g Intravenous Q24H Shela Leff, MD 200 mL/hr at 06/18/18 0600 1 g at 06/18/18 0600  . folic acid (FOLVITE) tablet 0.5 mg  0.5 mg Oral Daily Kamineni, Neelima, MD   0.5 mg at 06/18/18 1012  . furosemide (LASIX) injection 40 mg  40 mg Intravenous BID Guilford Shi, MD   40 mg at 06/18/18 0805  . heparin injection 5,000 Units  5,000 Units Subcutaneous Q8H Shela Leff, MD   5,000 Units at 06/18/18 1403  . hydrALAZINE (APRESOLINE) injection 5 mg  5 mg Intravenous Q4H PRN Shela Leff, MD      . insulin aspart (novoLOG) injection 0-5 Units  0-5 Units Subcutaneous QHS Guilford Shi, MD   5 Units at 06/17/18 2219  . insulin aspart (novoLOG) injection 0-9 Units  0-9 Units Subcutaneous TID WC Guilford Shi, MD   3 Units at 06/18/18 1220  . insulin aspart protamine- aspart (NOVOLOG MIX 70/30) injection 20 Units  20 Units Subcutaneous Q breakfast Guilford Shi, MD   20 Units at 06/18/18 [phone]  . insulin glargine (LANTUS) injection 20 Units  20 Units Subcutaneous QHS Guilford Shi, MD   20 Units at 06/17/18 2221  . ipratropium-albuterol (DUONEB) 0.5-2.5 (3) MG/3ML nebulizer solution 3 mL  3 mL Nebulization Q6H PRN Shela Leff, MD      . ipratropium-albuterol (DUONEB) 0.5-2.5 (3) MG/3ML nebulizer solution 3 mL  3 mL Nebulization TID Guilford Shi, MD   3 mL at 06/18/18 1434  . iron polysaccharides (NIFEREX) capsule 150 mg  150 mg Oral BID Guilford Shi, MD   150 mg at 06/18/18 1012  . magnesium oxide (MAG-OX) tablet 400 mg  400 mg Oral Daily Guilford Shi, MD   400 mg at 06/18/18 1012  . montelukast  (SINGULAIR) tablet 10 mg  10 mg Oral QHS Guilford Shi, MD   10 mg at 06/17/18 2221  . omega-3 acid ethyl esters (LOVAZA) capsule 1 g  1 g Oral Daily Guilford Shi, MD   1 g at 06/18/18 1012  . polyvinyl alcohol (LIQUIFILM TEARS) 1.4 % ophthalmic solution 1 drop  1 drop Both Eyes BID PRN Guilford Shi, MD      . prochlorperazine (COMPAZINE) tablet 5 mg  5 mg Oral Q6H PRN Guilford Shi, MD      . simvastatin (ZOCOR) tablet 20 mg  20 mg Oral Daily Guilford Shi, MD   20 mg at 06/18/18 1012  . traZODone (DESYREL) tablet 50 mg  50 mg Oral QHS PRN Guilford Shi, MD      .  vitamin B-12 (CYANOCOBALAMIN) tablet 1,000 mcg  1,000 mcg Oral Daily Guilford Shi, MD   1,000 mcg at 06/18/18 1012  . Vitamin D (Ergocalciferol) (DRISDOL) capsule 50,000 Units  50,000 Units Oral Once per day on Mon Thu Kamineni, Neelima, MD   50,000 Units at 06/18/18 8979     Discharge Medications: Please see discharge summary for a list of discharge medications.  Relevant Imaging Results:  Relevant Lab Results:   Additional Information SSN [id number]  Joaquin Courts, RN

## 2018-06-18 NOTE — TOC Initial Note (Signed)
Transition of Care [facility]) - Initial/Assessment Note    Patient Details  Name: [name] MRN: [id number] Date of Birth: 23-Jan-1940  Transition of Care [facility]) CM/SW Contact:    Joaquin Courts, RN Phone Number: 06/18/2018, 4:28 PM  Clinical Narrative:     CM spoke with patient at bedside regarding recommendations for SNF vs HH with 24 hour supervision. Patient states she has various people come and stay with her but does not have anyone who is with her 24 hours a day.  Patient is open to exploring short term SNF options for rehab.  FL2 faxed out to area facilities, awaiting response.                Expected Discharge Plan: Skilled Nursing Facility Barriers to Discharge: Continued Medical Work up   Patient Goals and CMS Choice Patient states their goals for this hospitalization and ongoing recovery are:: to go home again      Expected Discharge Plan and Services Expected Discharge Plan: [place]   Discharge Planning Services: CM Consult   Living arrangements for the past 2 months: Single Family Home                 DME Arranged: N/A DME Agency: NA       HH Arranged: NA HH Agency: NA        Prior Living Arrangements/Services Living arrangements for the past 2 months: Single Family Home Lives with:: Other (Comment)(states has various people come and stay with her) Patient language and need for interpreter reviewed:: Yes Do you feel safe going back to the place where you live?: Yes      Need for Family Participation in Patient Care: Yes (Comment) Care giver support system in place?: Yes (comment) Current home services: Home RN, Other (comment)(social work) Architect Involvement Pertinent to Current Situation/Hospitalization: No - Comment as needed  Activities of Daily Living Home Assistive Devices/Equipment: Environmental consultant (specify type), Scales ADL Screening (condition at time of admission) Patient's cognitive ability adequate to safely  complete daily activities?: Yes Is the patient deaf or have difficulty hearing?: No Does the patient have difficulty seeing, even when wearing glasses/contacts?: No Does the patient have difficulty concentrating, remembering, or making decisions?: No Patient able to express need for assistance with ADLs?: Yes Does the patient have difficulty dressing or bathing?: Yes Independently performs ADLs?: Yes (appropriate for developmental age) Does the patient have difficulty walking or climbing stairs?: No Weakness of Legs: None Weakness of Arms/Hands: None  Permission Sought/Granted Permission sought to share information with : Facility Art therapist granted to share information with : Yes, Verbal Permission Granted     Permission granted to share info w AGENCY: permission to fax out FL2 to area SNF        Emotional Assessment Appearance:: Appears stated age Attitude/Demeanor/Rapport: Engaged Affect (typically observed): Accepting Orientation: : Oriented to Self, Oriented to Place, Oriented to Situation, Oriented to  Time   Psych Involvement: No (comment)  Admission diagnosis:  Hyperglycemia [R73.9] Acute UTI [N39.0] Generalized weakness [R53.1] Diabetic hyperosmolar non-ketotic state ([place]) [E11.00] Patient Active Problem List   Diagnosis Date Noted  . Diabetic hyperosmolar non-ketotic state ([place]) 06/17/2018  . Hyperglycemia 06/16/2018  . Type 2 diabetes mellitus ([place]) 06/16/2018  . UTI (urinary tract infection) 06/16/2018  . Sacral decubitus ulcer 06/16/2018  . Acute respiratory failure with hypoxia ([place]) 04/04/2017  . Hypertensive urgency 04/04/2017  . CKD (chronic kidney disease) stage 4, GFR 15-29 ml/min (HCC) 04/04/2017  .  Hypoglycemia   . Acute renal failure superimposed on stage 3 chronic kidney disease ([place]) 11/23/2016  . Metabolic acidosis with normal anion gap and bicarbonate losses 11/23/2016  . Acute exacerbation of CHF (congestive heart failure)  ([place]) 11/22/2016  . Acute on chronic respiratory failure with hypoxia ([place]) 11/22/2016  . Midfoot ulcer, left, limited to breakdown of skin ([place]) 06/13/2016  . Idiopathic chronic venous hypertension of both lower extremities with inflammation 03/10/2016  . Onychomycosis 03/10/2016  . Chronic obstructive pulmonary disease ([place]) 11/07/2013  . Upper airway cough syndrome 11/07/2013  . Insomnia 12/10/2012  . OSA (obstructive sleep apnea) 09/17/2012  . Benign neoplasm of colon 08/14/2012  . Rectal bleeding 08/13/2012    Class: Acute  . Anemia 08/13/2012    Class: Acute  . Osteoarthritis 08/13/2012    Class: Chronic  . Acute posthemorrhagic anemia 08/13/2012  . Diverticulosis of colon (without mention of hemorrhage) 08/13/2012  . Hoarseness 07/20/2011  . COPD with asthma ([place]) 04/20/2011  . Allergic rhinitis 04/20/2011  . GI bleed 02/08/2011  . Personal history of colonic polyps 04/16/2010  . Fecal incontinence 03/23/2010  . Change in bowel habits 03/23/2010   PCP:  Burnard Bunting, MD Pharmacy:   [id number] - [place], [place] [address] Phone: [phone] Fax: [phone]     Social Determinants of Health (SDOH) Interventions    Readmission Risk Interventions Readmission Risk Prevention Plan 06/18/2018  Transportation Screening Complete  PCP or Specialist Appt within 3-5 Days Not Complete  Not Complete comments not yet ready for d/c  HRI or [place] Not Complete  HRI or Home Care Consult comments CM will follow for needs  Social Work Consult for [place] Planning/Counseling Complete  Palliative Care Screening Not Applicable  Medication Review Press photographer) Complete  Some recent data might be hidden

## 2018-06-18 NOTE — Progress Notes (Signed)
PROGRESS NOTE    [name]  HCW:[id number]  DOB: [birth year]  DOA: 06/16/2018 PCP: Burnard Bunting, MD  Brief Narrative: 78 y/o female with history of diastolic CHF, CKD stage IV, hypertension, hyperlipidemia, COPD on 2 L home O2, GERD, obstructive sleep apnea, chronic venous stasis, diabetes mellitus type 2 on home insulin regimen of Humalog 70/2520 units every morning and Lantus 20 units at bedtime presents with complaints of generalized weakness, worsening leg swellings and some shortness of breath.  She was noted to have blood glucose of 800 in the ED with bicarb of 18 (17 -22 at baseline in the setting of CKD) without urine ketones or anion gap or acidosis on ABG.  Patient admitted with glucose stabilizer.  She received 40 mEq p.o. potassium for hypokalemia. Subjective:Patient reports feeling better. Blood glucose in 200 s this morning. She  is saturating well on 2 L nasal cannula.  Patient reports being diagnosed with sleep apnea in the last couple of years and was issued a CPAP machine which she stopped using sometime last year.  Patient states that equipment had a leakage however unwilling to try CPAP while here.  Objective: Vitals:   06/18/18 0741 06/18/18 0803 06/18/18 1011 06/18/18 1434  BP:   (!) 132/55   Pulse:   70   Resp:      Temp:      TempSrc:      SpO2: 97% 97% 97% 98%  Weight:      Height:        Intake/Output Summary (Last 24 hours) at 06/18/2018 1456 Last data filed at 06/18/2018 1400 Gross per 24 hour  Intake 717.13 ml  Output 1950 ml  Net -1232.87 ml   Filed Weights   06/16/18 2135  Weight: 90.7 kg    Physical Examination:  General exam: Appears calm and comfortable  Respiratory system: Clear to auscultation. Respiratory effort normal. Cardiovascular system: S1 & S2 heard, RRR. No JVD, murmurs, rubs, gallops or clicks. Gastrointestinal system: Abdomen is nondistended, soft and nontender. No organomegaly or masses felt. Normal bowel sounds  heard. Central nervous system: Alert and oriented. No focal neurological deficits. Extremities: Acute on chronic venous stasis edema with stasis dermatitis, chronic valgus deformity in bilateral feet. Skin: No rashes, lesions or ulcers Psychiatry: Judgement and insight appear normal. Mood & affect appropriate.     Data Reviewed: I have personally reviewed following labs and imaging studies  CBC: Recent Labs  Lab 06/16/18 2015 06/17/18 0326  WBC 14.7* 13.5*  HGB 8.7* 7.5*  HCT 26.4* 22.6*  MCV 88.0 87.6  PLT 433* 176   Basic Metabolic Panel: Recent Labs  Lab 06/16/18 2017 06/17/18 0326 06/17/18 0740 06/17/18 1601 06/18/18 0924  NA 133* 140 143 141 140  K 3.6 3.2* 4.0 3.6 3.6  CL 103 112* 114* 110 111  CO2 18* 20* 21* 22 21*  GLUCOSE 800* 287* 156* 221* 274*  BUN 74* 68* 64* 65* 60*  CREATININE 2.65* 2.47* 2.32* 2.37* 2.42*  CALCIUM 8.9 8.6* 8.7* 8.9 8.5*   GFR: Estimated Creatinine Clearance: 21.1 mL/min (A) (by C-G formula based on SCr of 2.42 mg/dL (H)). Liver Function Tests: Recent Labs  Lab 06/16/18 2017  AST 12*  ALT 16  ALKPHOS 115  BILITOT 0.7  PROT 7.6  ALBUMIN 2.9*   No results for input(s): LIPASE, AMYLASE in the last 168 hours. No results for input(s): AMMONIA in the last 168 hours. Coagulation Profile: No results for input(s): INR, PROTIME in the last 168  hours. Cardiac Enzymes: Recent Labs  Lab 06/16/18 2017 06/17/18 0010 06/17/18 0326  TROPONINI 0.09* 0.08* 0.09*   BNP (last 3 results) No results for input(s): PROBNP in the last 8760 hours. HbA1C: Recent Labs    06/16/18 2015  HGBA1C 9.9*   CBG: Recent Labs  Lab 06/17/18 1232 06/17/18 1631 06/17/18 2218 06/18/18 0730 06/18/18 1139  GLUCAP 239* 204* 371* 238* 203*   Lipid Profile: No results for input(s): CHOL, HDL, LDLCALC, TRIG, CHOLHDL, LDLDIRECT in the last 72 hours. Thyroid Function Tests: No results for input(s): TSH, T4TOTAL, FREET4, T3FREE, THYROIDAB in the last  72 hours. Anemia Panel: No results for input(s): VITAMINB12, FOLATE, FERRITIN, TIBC, IRON, RETICCTPCT in the last 72 hours. Sepsis Labs: Recent Labs  Lab 06/16/18 2020  LATICACIDVEN 1.2    Recent Results (from the past 240 hour(s))  Urine culture     Status: Abnormal (Preliminary result)   Collection Time: 06/16/18  8:18 PM  Result Value Ref Range Status   Specimen Description   Final    Urine Performed at [facility] [address], [place], [place] [address]    Special Requests   Final    NONE Performed at [facility], [place] [address] [place], [place] [address]    Culture (A)  Final    >=100,000 COLONIES/mL KLEBSIELLA PNEUMONIAE 50,000 COLONIES/mL PROTEUS MIRABILIS SUSCEPTIBILITIES TO FOLLOW Performed at [facility], [place] [address] [place], [place] [address]    Report Status PENDING  Incomplete  SARS Coronavirus 2 (CEPHEID- Performed in [place] hospital lab), Hosp Order     Status: None   Collection Time: 06/16/18  9:28 PM  Result Value Ref Range Status   SARS Coronavirus 2 NEGATIVE NEGATIVE Final    Comment: (NOTE) If result is NEGATIVE SARS-CoV-2 target nucleic acids are NOT DETECTED. The SARS-CoV-2 RNA is generally detectable in upper and lower  respiratory specimens during the acute phase of infection. The lowest  concentration of SARS-CoV-2 viral copies this assay can detect is 250  copies / mL. A negative result does not preclude SARS-CoV-2 infection  and should not be used as the sole basis for treatment or other  patient management decisions.  A negative result may occur with  improper specimen collection / handling, submission of specimen other  than nasopharyngeal swab, presence of viral mutation(s) within the  areas targeted by this assay, and inadequate number of viral copies  (<250 copies / mL). A negative result must be combined with clinical  observations, patient history, and epidemiological  information. If result is POSITIVE SARS-CoV-2 target nucleic acids are DETECTED. The SARS-CoV-2 RNA is generally detectable in upper and lower  respiratory specimens dur ing the acute phase of infection.  Positive  results are indicative of active infection with SARS-CoV-2.  Clinical  correlation with patient history and other diagnostic information is  necessary to determine patient infection status.  Positive results do  not rule out bacterial infection or co-infection with other viruses. If result is PRESUMPTIVE POSTIVE SARS-CoV-2 nucleic acids MAY BE PRESENT.   A presumptive positive result was obtained on the submitted specimen  and confirmed on repeat testing.  While 2019 novel coronavirus  (SARS-CoV-2) nucleic acids may be present in the submitted sample  additional confirmatory testing may be necessary for epidemiological  and / or clinical management purposes  to differentiate between  SARS-CoV-2 and other Sarbecovirus currently known to infect humans.  If clinically indicated additional testing with an alternate test  methodology [phone]) is advised. The SARS-CoV-2 RNA is generally  detectable in upper and lower respiratory sp ecimens during the acute  phase of infection. The expected result is Negative. Fact Sheet for Patients:  StrictlyIdeas.no Fact Sheet for Healthcare Providers: BankingDealers.co.za This test is not yet approved or cleared by the [place] FDA and has been authorized for detection and/or diagnosis of SARS-CoV-2 by FDA under an Emergency Use Authorization (EUA).  This EUA will remain in effect (meaning this test can be used) for the duration of the COVID-19 declaration under Section 564(b)(1) of the Act, 21 U.S.C. section 360bbb-3(b)(1), unless the authorization is terminated or revoked sooner. Performed at [facility], [place] [address], [place],  [address]       Radiology  Studies: Dg Chest Port 1 View  Result Date: 06/16/2018 CLINICAL DATA:  Weakness 1 week. Swelling bilateral lower extremities. EXAM: PORTABLE CHEST 1 VIEW COMPARISON:  04/03/2017 FINDINGS: Patient is rotated to the left. Lungs are adequately inflated with subtle hazy opacification of the central pulmonary vasculature likely mild vascular congestion which is improved. No evidence of effusion or lobar consolidation. Mild cardiomegaly. Remainder of the exam is unchanged. IMPRESSION: Mild cardiomegaly with interval improved mild vascular congestion. Electronically Signed   By: Marin Olp M.D.   On: 06/16/2018 21:01        Scheduled Meds:  amLODipine  10 mg Oral Daily   calcitRIOL  0.25 mcg Oral Q M,W,F   calcium-vitamin D  1 tablet Oral Q breakfast   folic acid  0.5 mg Oral Daily   furosemide  40 mg Intravenous BID   heparin  5,000 Units Subcutaneous Q8H   insulin aspart  0-5 Units Subcutaneous QHS   insulin aspart  0-9 Units Subcutaneous TID WC   insulin aspart protamine- aspart  20 Units Subcutaneous Q breakfast   insulin glargine  20 Units Subcutaneous QHS   ipratropium-albuterol  3 mL Nebulization TID   iron polysaccharides  150 mg Oral BID   magnesium oxide  400 mg Oral Daily   montelukast  10 mg Oral QHS   omega-3 acid ethyl esters  1 g Oral Daily   simvastatin  20 mg Oral Daily   vitamin B-12  1,000 mcg Oral Daily   Vitamin D (Ergocalciferol)  50,000 Units Oral Once per day on Mon Thu   Continuous Infusions:  sodium chloride 250 mL (06/18/18 0558)   cefTRIAXone (ROCEPHIN)  IV 1 g (06/18/18 0600)    Assessment & Plan:    1.Acute on chronic diastolic CHF: Patient presented with complaints of dyspnea and worsening leg swellings.  She is now improving  with IV Lasix.  Chest x-ray suggestive of CHF.  BNP 194.  Last echo from November 2018 suggestive of stage II diastolic dysfunction.  Will repeat echo.  Patient takes p.o. torsemide at baseline (?  40 mg every  morning and 30 mg every evening).  Continue to monitor daily weights, I's and O's (net -1697 today) & neb treatments    2.  Hyperosmolar nonketotic hyperglycemia: Unclear precipitating factor as patient denies any recent illness, steroid use or diet/medication noncompliance although noted to have abnormal UA.  Patient's blood glucose was 800 on presentation.  Now improved to 200s. Patient back on her home insulin regimen with sliding scale.  Monitor blood glucose levels and adjust as needed.    3. UTI: Admitted with IV Rocephin, received 1 dose of Zosyn in the ED. urine cultures growing greater than 100,000 colony-forming units  of Klebsiella and Proteus.  Sensitivities pending.  Continue Rocephin for now.  Leukocytosis improving  4.  Hypokalemia: Replaced.  Monitor on IV diuretics.  5.  COPD, chronic hypoxic respiratory failure: Stable on 2 L nasal cannula.  Continue neb as needed will add scheduled neb treatments x1 day  6.  Chronic kidney disease: Creatinine stable around 2.4-2.6.  Resume home medications.  Monitor on IV diuretics  7.  Hypertension/hyperlipidemia: Resume home medications  8.  Elevated troponins: In the setting of chronic kidney disease.  No complaints of chest pain or EKG abnormalities.  9.  Sacral decubitus: Stable with no signs of infection.  Wound care consult  10.  Chronic venous stasis/stasis dermatitis/venous ulcerations: Appreciate evaluation by wound care who recommended home health referral for Unna boots as outpatient.  DVT prophylaxis: Heparin Code Status: Full code Family / Patient Communication: Discussed with patient Disposition Plan: Home when medically stable Will downgrade to telemetry status as now off insulin drip and electrolyte abnormalities improving.    LOS: 2 days    Time spent: 35 minutes    [place]ford Shi, MD [facility] Pager [phone]  If 7PM-7AM, please contact night-coverage [email or web address] Password [facility] 06/18/2018,  2:56 PM

## 2018-06-18 NOTE — Progress Notes (Signed)
Pt states that she is unable to tolerate CPAP QHS.  RT to monitor and assess as needed.

## 2018-06-18 NOTE — Evaluation (Signed)
Physical Therapy Evaluation Patient Details Name: [name] MRN: [id number] DOB: 09-25-1940 Today's Date: 06/18/2018   History of Present Illness  79 y/o female with history of diastolic CHF, CKD stage IV, hypertension, hyperlipidemia, COPD on 2 L home O2, GERD, obstructive sleep apnea, chronic venous stasis, diabetes mellitus type 2; admitted for hyperglycemia   Clinical Impression  Pt admitted with above diagnosis. Pt currently with functional limitations due to the deficits listed below (see PT Problem List).  Recommend SNF vs HHPT abn db24 hr assist; pt is deconditioned and requiring incr assist, she is fairly independent at her baseline.   Pt will benefit from skilled PT to increase their independence and safety with mobility to allow discharge to the venue listed below.       Follow Up Recommendations SNF(vs HHPT and 24hr assist)    Equipment Recommendations  None recommended by PT    Recommendations for Other Services       Precautions / Restrictions Precautions Precautions: Fall Restrictions Weight Bearing Restrictions: No      Mobility  Bed Mobility Overal bed mobility: Needs Assistance Bed Mobility: Supine to Sit;Sit to Supine     Supine to sit: Min assist Sit to supine: Min guard   General bed mobility comments: incr time, effortful transition, min assist to bring trunk to full upright  Transfers Overall transfer level: Needs assistance Equipment used: Rolling walker (2 wheeled) Transfers: Sit to/from Stand Sit to Stand: Min assist;Mod assist;From elevated surface         General transfer comment: x2 trials, incr pt effort on second trial, limited by LE pain; able to stand only for brief time, ~ 8 sec   Ambulation/Gait                Stairs            Wheelchair Mobility    Modified Rankin (Stroke Patients Only)       Balance Overall balance assessment: Needs assistance Sitting-balance support: No upper extremity  supported;Feet supported Sitting balance-Leahy Scale: Fair       Standing balance-Leahy Scale: Poor Standing balance comment: heavily  reliant on UEs                              Pertinent Vitals/Pain Pain Assessment: (reports chronic left shoulder pain, intermittent)    Home Living Family/patient expects to be discharged to:: Private residence Living Arrangements: Non-relatives/Friends Available Help at Discharge: Friend(s);Family;Available PRN/intermittently Type of Home: House Home Access: Stairs to enter   Entrance Stairs-Number of Steps: 1 Home Layout: One level Home Equipment: Walker - 4 wheels;Grab bars - toilet;Tub bench Additional Comments: chronically O2 dependent (2L)    Prior Function Level of Independence: Needs assistance   Gait / Transfers Assistance Needed: amb with rollator           Hand Dominance        Extremity/Trunk Assessment   Upper Extremity Assessment Upper Extremity Assessment: Defer to OT evaluation    Lower Extremity Assessment Lower Extremity Assessment: Generalized weakness       Communication      Cognition Arousal/Alertness: Awake/alert Behavior During Therapy: WFL for tasks assessed/performed Overall Cognitive Status: Within Functional Limits for tasks assessed  General Comments      Exercises     Assessment/Plan    PT Assessment Patient needs continued PT services  PT Problem List Decreased strength;Decreased activity tolerance;Decreased mobility;Decreased balance;Decreased range of motion;Pain;Decreased knowledge of use of DME       PT Treatment Interventions DME instruction;Functional mobility training;Patient/family education;Gait training;Therapeutic activities;Therapeutic exercise    PT Goals (Current goals can be found in the Care Plan section)  Acute Rehab PT Goals Patient Stated Goal: home  PT Goal Formulation: With patient Time For  Goal Achievement: 06/18/18 Potential to Achieve Goals: Good    Frequency Min 3X/week   Barriers to discharge        Co-evaluation               AM-PAC PT "6 Clicks" Mobility  Outcome Measure Help needed turning from your back to your side while in a flat bed without using bedrails?: A Little Help needed moving from lying on your back to sitting on the side of a flat bed without using bedrails?: A Little Help needed moving to and from a bed to a chair (including a wheelchair)?: A Lot Help needed standing up from a chair using your arms (e.g., wheelchair or bedside chair)?: A Lot Help needed to walk in hospital room?: A Lot Help needed climbing 3-5 steps with a railing? : A Lot 6 Click Score: 14    End of Session Equipment Utilized During Treatment: Gait belt Activity Tolerance: Patient tolerated treatment well;Patient limited by pain Patient left: in bed;with call bell/phone within reach;with bed alarm set Nurse Communication: Mobility status PT Visit Diagnosis: Unsteadiness on feet (R26.81)    Time: 1335-1401 PT Time Calculation (min) (ACUTE ONLY): 26 min   Charges:   PT Evaluation $PT Eval Low Complexity: 1 Low PT Treatments $Therapeutic Activity: 8-22 mins        Kenyon Ana, PT  Pager: [phone] Acute Rehab Dept [facility]): [phone]   06/18/2018   [facility] 06/18/2018, 2:18 PM

## 2018-06-19 ENCOUNTER — Inpatient Hospital Stay (HOSPITAL_COMMUNITY): Payer: Medicare Other

## 2018-06-19 DIAGNOSIS — I361 Nonrheumatic tricuspid (valve) insufficiency: Secondary | ICD-10-CM

## 2018-06-19 DIAGNOSIS — I340 Nonrheumatic mitral (valve) insufficiency: Secondary | ICD-10-CM

## 2018-06-19 LAB — ECHOCARDIOGRAM COMPLETE
Height: 63 in
Weight: 2984.15 oz

## 2018-06-19 LAB — BASIC METABOLIC PANEL
Anion gap: 9 (ref 5–15)
BUN: 62 mg/dL — ABNORMAL HIGH (ref 8–23)
CO2: 22 mmol/L (ref 22–32)
Calcium: 8.6 mg/dL — ABNORMAL LOW (ref 8.9–10.3)
Chloride: 107 mmol/L (ref 98–111)
Creatinine, Ser: 2.44 mg/dL — ABNORMAL HIGH (ref 0.44–1.00)
GFR calc Af Amer: 22 mL/min — ABNORMAL LOW (ref >60)
GFR calc non Af Amer: 19 mL/min — ABNORMAL LOW (ref >60)
Glucose, Bld: 334 mg/dL — ABNORMAL HIGH (ref 70–99)
Potassium: 3.9 mmol/L (ref 3.5–5.1)
Sodium: 138 mmol/L (ref 135–145)

## 2018-06-19 LAB — GLUCOSE, CAPILLARY
Glucose-Capillary: 293 mg/dL — ABNORMAL HIGH (ref 70–99)
Glucose-Capillary: 218 mg/dL — ABNORMAL HIGH (ref 70–99)
Glucose-Capillary: 175 mg/dL — ABNORMAL HIGH (ref 70–99)
Glucose-Capillary: 301 mg/dL — ABNORMAL HIGH (ref 70–99)

## 2018-06-19 LAB — SARS CORONAVIRUS 2 BY RT PCR (HOSPITAL ORDER, PERFORMED IN ~~LOC~~ HOSPITAL LAB): SARS Coronavirus 2: NEGATIVE

## 2018-06-19 MED ORDER — ADULT MULTIVITAMIN W/MINERALS CH
1.0000 | ORAL_TABLET | Freq: Every day | ORAL | Status: DC
  Administered 2018-06-19 – 2018-06-20 (×2): 1 via ORAL
  Filled 2018-06-19 (×2): qty 1

## 2018-06-19 MED ORDER — INSULIN GLARGINE 100 UNIT/ML ~~LOC~~ SOLN: 25.0000 [IU] | Freq: Every day | SUBCUTANEOUS | Status: DC

## 2018-06-19 MED ORDER — PRO-STAT SUGAR FREE PO LIQD
30.0000 mL | Freq: Two times a day (BID) | ORAL | Status: DC
  Administered 2018-06-19 – 2018-06-20 (×2): 30 mL via ORAL
  Filled 2018-06-19 (×2): qty 30

## 2018-06-19 MED ORDER — INSULIN GLARGINE 100 UNIT/ML ~~LOC~~ SOLN
30.0000 [IU] | Freq: Every day | SUBCUTANEOUS | Status: DC
  Administered 2018-06-19: 30 [IU] via SUBCUTANEOUS
  Filled 2018-06-19 (×2): qty 0.3

## 2018-06-19 MED ORDER — INSULIN ASPART 100 UNIT/ML ~~LOC~~ SOLN
6.0000 [IU] | Freq: Three times a day (TID) | SUBCUTANEOUS | Status: DC
  Administered 2018-06-19 – 2018-06-20 (×4): 6 [IU] via SUBCUTANEOUS

## 2018-06-19 NOTE — Progress Notes (Signed)
PROGRESS NOTE    Doralene Glanz Shadrick  YTK:[id number] DOB: 01/21/1940 DOA: 06/16/2018 PCP: Burnard Bunting, MD   Brief Narrative:  78 year old with CKD stage IV, diastolic CHF, essential hypertension, hyperlipidemia, COPD on 2 L nasal cannula, obstructive sleep apnea, chronic venous stasis, diabetes mellitus type 2 came to the hospital for evaluation of generalized weakness.  She was found to also have lower extremity swelling and more short of breath.  She was also hyperglycemic with blood glucose around 800 at the time of admission.   Assessment & Plan:   Principal Problem:   Hyperglycemia Active Problems:   CKD (chronic kidney disease) stage 4, GFR 15-29 ml/min (HCC)   Type 2 diabetes mellitus (HCC)   UTI (urinary tract infection)   Sacral decubitus ulcer   Diabetic hyperosmolar non-ketotic state ([place])  Acute on chronic diastolic congestive heart failure, grade 2 diastolic dysfunction.  Class III - Echocardiogram from 7517 shows diastolic dysfunction stage II.  Repeat echo has been done- pending results.  Continue gentle diuretics.  Monitor input and output.  Electrolytes.  Continue IV diuretics for 1 more day.  Maintain fluid restriction 200 cc daily.  Wean off oxygen.  Urinary tract infection, present prior to readmission - Cultures are growing Klebsiella and Proteus.  Continue IV Rocephin.  Hyperosmolar nonketotic hyperglycemia Insulin-dependent diabetes mellitus type 2 -Lantus will be increased to 30 units at bedtime.  We will stop 70/30 20 units daily which can be resumed at the time of discharge.  Add NovoLog 6 units 3 times before meal.  Diabetic coordinator following.  COPD with chronic hypoxia on 2 L nasal cannula -Continue home bronchodilator treatment.  Chronic kidney disease stage IV -Baseline creatinine 2.5.  Currently appears to be at baseline.  Closely monitor this in the setting of aggressive diuresis.  Essential hypertension -Resume home meds  Hyperlipidemia  -Resume home meds  Chronic venous stasis ulcer -Wound care evaluation.  Generalized weakness -Physical therapy recommends skilled nursing facility versus 24-hour supervision with home health at home.  Sacral decubitus ulcer, unstageable.  Present prior to admission.  DVT prophylaxis: Heparin Code Status: Full code Family Communication: None at bedside Disposition Plan: Maintain hospital stay today for 1 more day of IV diuretics.  If she does well, possible discharge tomorrow.  Consultants:   None  Procedures:   None  Antimicrobials:   Rocephin day 2   Subjective: P no complaints per patient while at rest but she is concerned that she does get short of breath with minimal ambulation.  Also tells me that she lives at home alone and periodically people check up on her.  She also uses 2 L nasal cannula only at night.  Tolerated her breakfast well this morning  Review of Systems Otherwise negative except as per HPI, including: General: Denies fever, chills, night sweats or unintended weight loss. Resp: Denies cough, wheezing Cardiac: Denies chest pain, palpitations, orthopnea, paroxysmal nocturnal dyspnea. GI: Denies abdominal pain, nausea, vomiting, diarrhea or constipation GU: Denies dysuria, frequency, hesitancy or incontinence MS: Denies muscle aches, joint pain or swelling Neuro: Denies headache, neurologic deficits (focal weakness, numbness, tingling), abnormal gait Psych: Denies anxiety, depression, SI/HI/AVH Skin: Denies new rashes or lesions ID: Denies sick contacts, exotic exposures, travel  Objective: Vitals:   06/19/18 0525 06/19/18 0745 06/19/18 0750 06/19/18 0923  BP: (!) 169/60   (!) 150/65  Pulse: 63   75  Resp: 20     Temp: 98.6 F (37 C)     TempSrc: Oral  SpO2: 100% 95% 95%   Weight: 84.6 kg     Height:        Intake/Output Summary (Last 24 hours) at 06/19/2018 1001 Last data filed at 06/19/2018 0850 Gross per 24 hour  Intake 734.66 ml   Output 2700 ml  Net -1965.34 ml   Filed Weights   06/16/18 2135 06/19/18 0525  Weight: 90.7 kg 84.6 kg    Examination:  General exam: Appears calm and comfortable, on 2 L nasal cannula.  Elderly frail-appearing. Respiratory system: Bibasilar crackles Cardiovascular system: S1 & S2 heard, RRR. No JVD, murmurs, rubs, gallops or clicks. No pedal edema. Gastrointestinal system: Abdomen is nondistended, soft and nontender. No organomegaly or masses felt. Normal bowel sounds heard. Central nervous system: Alert and oriented. No focal neurological deficits. Extremities: Symmetric 4 x 5 power. Skin: No rashes, lesions or ulcers Psychiatry: Judgement and insight appear normal. Mood & affect appropriate.     Data Reviewed:   CBC: Recent Labs  Lab 06/16/18 2015 06/17/18 0326  WBC 14.7* 13.5*  HGB 8.7* 7.5*  HCT 26.4* 22.6*  MCV 88.0 87.6  PLT 433* 357   Basic Metabolic Panel: Recent Labs  Lab 06/17/18 0326 06/17/18 0740 06/17/18 1601 06/18/18 0924 06/19/18 0430  NA 140 143 141 140 138  K 3.2* 4.0 3.6 3.6 3.9  CL 112* 114* 110 111 107  CO2 20* 21* 22 21* 22  GLUCOSE 287* 156* 221* 274* 334*  BUN 68* 64* 65* 60* 62*  CREATININE 2.47* 2.32* 2.37* 2.42* 2.44*  CALCIUM 8.6* 8.7* 8.9 8.5* 8.6*   GFR: Estimated Creatinine Clearance: 20.2 mL/min (A) (by C-G formula based on SCr of 2.44 mg/dL (H)). Liver Function Tests: Recent Labs  Lab 06/16/18 2017  AST 12*  ALT 16  ALKPHOS 115  BILITOT 0.7  PROT 7.6  ALBUMIN 2.9*   No results for input(s): LIPASE, AMYLASE in the last 168 hours. No results for input(s): AMMONIA in the last 168 hours. Coagulation Profile: No results for input(s): INR, PROTIME in the last 168 hours. Cardiac Enzymes: Recent Labs  Lab 06/16/18 2017 06/17/18 0010 06/17/18 0326  TROPONINI 0.09* 0.08* 0.09*   BNP (last 3 results) No results for input(s): PROBNP in the last 8760 hours. HbA1C: Recent Labs    06/16/18 2015  HGBA1C 9.9*   CBG:  Recent Labs  Lab 06/18/18 0730 06/18/18 1139 06/18/18 1643 06/18/18 2125 06/19/18 0750  GLUCAP 238* 203* 225* 312* 293*   Lipid Profile: No results for input(s): CHOL, HDL, LDLCALC, TRIG, CHOLHDL, LDLDIRECT in the last 72 hours. Thyroid Function Tests: No results for input(s): TSH, T4TOTAL, FREET4, T3FREE, THYROIDAB in the last 72 hours. Anemia Panel: No results for input(s): VITAMINB12, FOLATE, FERRITIN, TIBC, IRON, RETICCTPCT in the last 72 hours. Sepsis Labs: Recent Labs  Lab 06/16/18 2020  LATICACIDVEN 1.2    Recent Results (from the past 240 hour(s))  Urine culture     Status: Abnormal (Preliminary result)   Collection Time: 06/16/18  8:18 PM  Result Value Ref Range Status   Specimen Description Urine  Final   Special Requests NONE  Final   Culture (A)  Final    >=100,000 COLONIES/mL KLEBSIELLA PNEUMONIAE 50,000 COLONIES/mL PROTEUS MIRABILIS SUSCEPTIBILITIES TO FOLLOW    Report Status PENDING  Incomplete   Organism ID, Bacteria KLEBSIELLA PNEUMONIAE (A)  Final      Susceptibility   Klebsiella pneumoniae - MIC*    AMPICILLIN >=32 RESISTANT Resistant     CEFAZOLIN <=4 SENSITIVE Sensitive  CEFEPIME <=1 SENSITIVE Sensitive     CEFTAZIDIME <=1 SENSITIVE Sensitive     CEFTRIAXONE <=1 SENSITIVE Sensitive     CIPROFLOXACIN <=0.25 SENSITIVE Sensitive     GENTAMICIN <=1 SENSITIVE Sensitive     IMIPENEM <=0.25 SENSITIVE Sensitive     TRIMETH/SULFA <=20 SENSITIVE Sensitive     AMPICILLIN/SULBACTAM 4 SENSITIVE Sensitive     PIP/TAZO <=4 SENSITIVE Sensitive     Extended ESBL Value in next row Sensitive      NEGATIVEPerformed at [facility] [address], [place], [place] [address]    * >=100,000 COLONIES/mL KLEBSIELLA PNEUMONIAE  SARS Coronavirus 2 (CEPHEID- Performed in [facility] hospital lab), Hosp Order     Status: None   Collection Time: 06/16/18  9:28 PM  Result Value Ref Range Status   SARS Coronavirus 2 NEGATIVE NEGATIVE Final    Comment: (NOTE) If  result is NEGATIVE SARS-CoV-2 target nucleic acids are NOT DETECTED. The SARS-CoV-2 RNA is generally detectable in upper and lower  respiratory specimens during the acute phase of infection. The lowest  concentration of SARS-CoV-2 viral copies this assay can detect is 250  copies / mL. A negative result does not preclude SARS-CoV-2 infection  and should not be used as the sole basis for treatment or other  patient management decisions.  A negative result may occur with  improper specimen collection / handling, submission of specimen other  than nasopharyngeal swab, presence of viral mutation(s) within the  areas targeted by this assay, and inadequate number of viral copies  (<250 copies / mL). A negative result must be combined with clinical  observations, patient history, and epidemiological information. If result is POSITIVE SARS-CoV-2 target nucleic acids are DETECTED. The SARS-CoV-2 RNA is generally detectable in upper and lower  respiratory specimens dur ing the acute phase of infection.  Positive  results are indicative of active infection with SARS-CoV-2.  Clinical  correlation with patient history and other diagnostic information is  necessary to determine patient infection status.  Positive results do  not rule out bacterial infection or co-infection with other viruses. If result is PRESUMPTIVE POSTIVE SARS-CoV-2 nucleic acids MAY BE PRESENT.   A presumptive positive result was obtained on the submitted specimen  and confirmed on repeat testing.  While 2019 novel coronavirus  (SARS-CoV-2) nucleic acids may be present in the submitted sample  additional confirmatory testing may be necessary for epidemiological  and / or clinical management purposes  to differentiate between  SARS-CoV-2 and other Sarbecovirus currently known to infect humans.  If clinically indicated additional testing with an alternate test  methodology [phone]) is advised. The SARS-CoV-2 RNA is generally   detectable in upper and lower respiratory sp ecimens during the acute  phase of infection. The expected result is Negative. Fact Sheet for Patients:  StrictlyIdeas.no Fact Sheet for Healthcare Providers: BankingDealers.co.za This test is not yet approved or cleared by the [place] FDA and has been authorized for detection and/or diagnosis of SARS-CoV-2 by FDA under an Emergency Use Authorization (EUA).  This EUA will remain in effect (meaning this test can be used) for the duration of the COVID-19 declaration under Section 564(b)(1) of the Act, 21 U.S.C. section 360bbb-3(b)(1), unless the authorization is terminated or revoked sooner. Performed at [facility], [place] [address] [place], [place] [address]          Radiology Studies: No results found.      Scheduled Meds: . amLODipine  10 mg Oral Daily  .  calcitRIOL  0.25 mcg Oral Q M,W,F  . calcium-vitamin D  1 tablet Oral Q breakfast  . folic acid  0.5 mg Oral Daily  . furosemide  40 mg Intravenous BID  . heparin  5,000 Units Subcutaneous Q8H  . insulin aspart  0-5 Units Subcutaneous QHS  . insulin aspart  0-9 Units Subcutaneous TID WC  . insulin aspart protamine- aspart  20 Units Subcutaneous Q breakfast  . insulin glargine  25 Units Subcutaneous QHS  . ipratropium-albuterol  3 mL Nebulization TID  . iron polysaccharides  150 mg Oral BID  . magnesium oxide  400 mg Oral Daily  . montelukast  10 mg Oral QHS  . omega-3 acid ethyl esters  1 g Oral Daily  . simvastatin  20 mg Oral Daily  . vitamin B-12  1,000 mcg Oral Daily  . Vitamin D (Ergocalciferol)  50,000 Units Oral Once per day on Mon Thu   Continuous Infusions: . sodium chloride 250 mL (06/18/18 0558)  . cefTRIAXone (ROCEPHIN)  IV 1 g (06/19/18 0639)     LOS: 3 days   Time spent= 23mins    Braxton Weisbecker Arsenio Loader, MD [facility]  If 7PM-7AM, please contact night-coverage  [email or web address] 06/19/2018, 10:01 AM

## 2018-06-19 NOTE — TOC Progression Note (Signed)
Transition of Care [facility]) - Progression Note    Patient Details  Name: [name] MRN: [id number] Date of Birth: December 12, 1940  Transition of Care [facility]) CM/SW Contact  Adolfo Granieri, Juliann Pulse, RN Phone Number: 06/19/2018, 1:42 PM  Clinical Narrative:[place] Arlie Solomons for SNF-COVID test to be done prior d/c, & If CPAP needed @ d/c please put in d/c summary-MD updated.       Expected Discharge Plan: [place] Barriers to Discharge: Continued Medical Work up  Expected Discharge Plan and Services Expected Discharge Plan: [place]   Discharge Planning Services: CM Consult   Living arrangements for the past 2 months: Single Family Home                 DME Arranged: N/A DME Agency: NA       HH Arranged: NA HH Agency: NA         Social Determinants of Health (SDOH) Interventions    Readmission Risk Interventions Readmission Risk Prevention Plan 06/18/2018  Transportation Screening Complete  PCP or Specialist Appt within 3-5 Days Not Complete  Not Complete comments not yet ready for d/c  HRI or [place] Not Complete  HRI or Home Care Consult comments CM will follow for needs  Social Work Consult for [place] Planning/Counseling Complete  Palliative Care Screening Not Applicable  Medication Review Press photographer) Complete  Some recent data might be hidden
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
## 2018-06-19 NOTE — Progress Notes (Signed)
Initial Nutrition Assessment  RD working remotely.   DOCUMENTATION CODES:   Obesity unspecified  INTERVENTION:  - will order 30 ml prostat BID, each supplement provides 100 kcal and 15 grams protein. - will order daily multivitamin with minerals. - continue to encourage PO intakes.    NUTRITION DIAGNOSIS:   Increased nutrient needs related to acute illness, wound healing as evidenced by estimated needs.  GOAL:   Patient will meet greater than or equal to 90% of their needs  MONITOR:   PO intake, Supplement acceptance, Labs, Weight trends, Skin  REASON FOR ASSESSMENT:   Other (Comment)(Pressure Injury report)    ASSESSMENT:   78 year old with CKD stage 4, CHF, essential HTN, hyperlipidemia, COPD on 2L nasal cannula, OSA, chronic venous stasis, and type 2 DM. She presented to the ED for evaluation of generalized weakness. She was found to also have lower extremity swelling and increased short of breath. She was also hyperglycemic with blood glucose around 800 mg/dl at the time of admission.  Patient consumed 75% of breakfast (pancake, chicken sausage, applesauce, cream of wheat, and skim milk--~300 kcal, 15 grams protein), 100% of lunch (meatloaf, mac and cheese, side salad, broccoli, and grapes--515 kcal, 26 grams protein) yesterday and 100% of breakfast (scrambled eggs, grits, english muffin, chicken sausage, peaches, and skim milk--440 kcal, 29 grams protein) today.   Patient reports good appetite PTA and since admission. He has no pain or difficulties with chewing or swallowing. Patient does not feel that weight has changed at all recently. Per chart review, current weight is 186 lb and most recent weight PTA was on 04/08/17 when she weighed 214 lb. This indicates 28 lb weight loss (13% body weight) in the past 15 months which is significant for time frame.   Per notes: acute on CHF, UTI, hyperosmolar non-ketotic hyperglycemia--insulin regimen increased, chronic venous stasis  ulcer, generalized weakness with SNF being recommended by PT. MD note today states possible d/c tomorrow (6/10).    Medications reviewed; 1 tablet oscal with D/day, 0.5 mg oral folvite/day, 40 mg IV lasix BID, sliding scale novolog, 6 nits novolog TID, 30 units lantus/day, 400 mg mag-ox/day, 1000 mcg oral cyanocobalamin/day, 50000 units drisdol x2/week starting 6/8. Labs reviewed; CBG: 293 mg/dl today, BUN: 62 mg/dl, creatinine: 2.44 mg/dl, Ca: 8.6 mg/dl, GFR: 22 ml/min.      NUTRITION - FOCUSED PHYSICAL EXAM:  unable to complete at this time  Diet Order:   Diet Order            Diet heart healthy/carb modified Room service appropriate? Yes; Fluid consistency: Thin; Fluid restriction: 1200 mL Fluid  Diet effective now              EDUCATION NEEDS:   Not appropriate for education at this time  Skin:  Skin Assessment: Skin Integrity Issues: Skin Integrity Issues:: Stage II, Diabetic Ulcer Stage II: bilateral buttocks, sacrum Diabetic Ulcer: bilateral tibial area  Last BM:  6/9  Height:   Ht Readings from Last 1 Encounters:  06/16/18 _0  (1.6 m)    Weight:   Wt Readings from Last 1 Encounters:  06/19/18 84.6 kg    Ideal Body Weight:  52.3 kg  BMI:  Body mass index is 33.04 kg/m.  Estimated Nutritional Needs:   Kcal:  1945-2115 kcal  Protein:  80-90 grams  Fluid:  >/= 1.8 L/day     Jarome Matin, MS, RD, LDN, [facility] Inpatient Clinical Dietitian Pager # [phone] After hours/weekend pager # [phone]

## 2018-06-19 NOTE — Progress Notes (Signed)
Patient continues to decline nocturnal CPAP. She states she is not able to tolerate the mask and has not used one at home for more than a year. Order changed to prn per RT protocol.

## 2018-06-19 NOTE — Care Management Important Message (Signed)
Important Message  Patient Details IM Letter given to Dessa Phi RN to present to the Patient Name: [name] MRN: [id number] Date of Birth: 1940/04/18   Medicare Important Message Given:  Yes    Kerin Salen 06/19/2018, 10:02 AM

## 2018-06-19 NOTE — TOC Transition Note (Signed)
Transition of Care [facility]) - CM/SW Discharge Note   Patient Details  Name: [name] MRN: [id number] Date of Birth: 1940/08/02  Transition of Care [facility]) CM/SW Contact:  Dessa Phi, RN Phone Number: 06/19/2018, 12:35 PM   Clinical Narrative:  Patient wanted CM to contact her friend [id number] [phone] patient request-speaker phone to go over SNF choices-patient agree to [place] wanted [place] rep to contact her-rep [name] called patient in on phone# in rm. Await medical stability for d/c.    Final next level of care: Skilled Nursing Facility Barriers to Discharge: Continued Medical Work up   Patient Goals and CMS Choice Patient states their goals for this hospitalization and ongoing recovery are:: to go home again      Discharge Placement PASRR number recieved: 06/18/18            Patient chooses bed at: [facility])        Discharge Plan and Services   Discharge Planning Services: CM Consult            DME Arranged: N/A DME Agency: NA       HH Arranged: NA [place] Agency: NA        Social Determinants of Health ([place]) Interventions     Readmission Risk Interventions Readmission Risk Prevention Plan 06/18/2018  Transportation Screening Complete  PCP or Specialist Appt within 3-5 Days Not Complete  Not Complete comments not yet ready for d/c  HRI or [place] Not Complete  HRI or Home Care Consult comments CM will follow for needs  Social Work Consult for [place] Planning/Counseling Complete  Palliative Care Screening Not Applicable  Medication Review Press photographer) Complete  Some recent data might be hidden

## 2018-06-19 NOTE — Progress Notes (Addendum)
Inpatient Diabetes Program Recommendations  AACE/ADA: New Consensus Statement on Inpatient Glycemic Control (2015)  Target Ranges:  Prepandial:   less than 140 mg/dL      Peak postprandial:   less than 180 mg/dL (1-2 hours)      Critically ill patients:  140 - 180 mg/dL   Results for [name], [name] (MRN [id number]) as of 06/19/2018 08:08  Ref. Range 06/18/2018 07:30 06/18/2018 11:39 06/18/2018 16:43 06/18/2018 21:25  Glucose-Capillary Latest Ref Range: 70 - 99 mg/dL 238 (H)  3 units NOVOLOG +  20 units 70/30 Insulin 203 (H)  3 units NOVOLOG  225 (H)  3 units NOVOLOG  312 (H)  4 units NOVOLOG +  20 units LANTUS   Results for [name], [name] (MRN [id number]) as of 06/19/2018 08:08  Ref. Range 06/19/2018 07:50  Glucose-Capillary Latest Ref Range: 70 - 99 mg/dL 293 (H)     Admit Hyperglycemia (glucose 800 on admit)/ UTI/ LE Swelling  History: DM, CHF,COPD, CKD4  Home DM Meds: Lantus 20 units QHS                              Humalog 75/25 Insulin 20 units daily  Current Orders: Lantus 25 units QHS                            70/30 Insulin 20 units QAM                            Novolog Sensitive Correction Scale/ SSI (0-9 units) TID AC + HS    PCP: Dr. Reynaldo Minium       MD- Please consider the following in-hospital insulin adjustments:   1. Note Lantus increased to 25 units QHS for tonight--May want to further increase Lantus to 30 units QHS since CBG was 293 mg/dl this AM   2. Stop 70/30 Insulin 20 units Daily in the AM for now--Can resume at time of discharge home   3. Start Novolog Meal Coverage: Novolog 6 units TID with meals  (Please add the following Hold Parameters: Hold if pt eats <50% of meal, Hold if pt NPO)      --Will follow patient during hospitalization--  Wyn Quaker RN, MSN, CDE Diabetes Coordinator Inpatient Glycemic Control Team Team Pager: [phone] (8a-5p)

## 2018-06-20 LAB — BASIC METABOLIC PANEL
Anion gap: 11 (ref 5–15)
BUN: 66 mg/dL — ABNORMAL HIGH (ref 8–23)
CO2: 25 mmol/L (ref 22–32)
Calcium: 8.8 mg/dL — ABNORMAL LOW (ref 8.9–10.3)
Chloride: 105 mmol/L (ref 98–111)
Creatinine, Ser: 2.35 mg/dL — ABNORMAL HIGH (ref 0.44–1.00)
GFR calc Af Amer: 23 mL/min — ABNORMAL LOW (ref >60)
GFR calc non Af Amer: 19 mL/min — ABNORMAL LOW (ref >60)
Glucose, Bld: 183 mg/dL — ABNORMAL HIGH (ref 70–99)
Potassium: 3.8 mmol/L (ref 3.5–5.1)
Sodium: 141 mmol/L (ref 135–145)

## 2018-06-20 LAB — URINE CULTURE: Culture: >=100000 — AB

## 2018-06-20 LAB — GLUCOSE, CAPILLARY
Glucose-Capillary: 153 mg/dL — ABNORMAL HIGH (ref 70–99)
Glucose-Capillary: 249 mg/dL — ABNORMAL HIGH (ref 70–99)

## 2018-06-20 LAB — BRAIN NATRIURETIC PEPTIDE: B Natriuretic Peptide: 156.2 pg/mL — ABNORMAL HIGH (ref 0.0–100.0)

## 2018-06-20 LAB — MAGNESIUM: Magnesium: 1.8 mg/dL (ref 1.7–2.4)

## 2018-06-20 MED ORDER — CEPHALEXIN 500 MG PO CAPS: 500.0000 mg | ORAL_CAPSULE | Freq: Two times a day (BID) | ORAL | 0 refills | Status: AC

## 2018-06-20 MED ORDER — CEPHALEXIN 500 MG PO CAPS: 500.0000 mg | ORAL_CAPSULE | Freq: Two times a day (BID) | ORAL | Status: DC

## 2018-06-20 MED ORDER — INSULIN ASPART 100 UNIT/ML ~~LOC~~ SOLN: 6.0000 [IU] | Freq: Three times a day (TID) | SUBCUTANEOUS | 11 refills | Status: DC

## 2018-06-20 MED ORDER — PRO-STAT SUGAR FREE PO LIQD: 30.0000 mL | Freq: Two times a day (BID) | ORAL | 0 refills | Status: DC

## 2018-06-20 MED ORDER — INSULIN GLARGINE 100 UNIT/ML ~~LOC~~ SOLN: 30.0000 [IU] | Freq: Every day | SUBCUTANEOUS | 11 refills | Status: DC

## 2018-06-20 MED ORDER — LORAZEPAM 1 MG PO TABS: 0.5000 mg | ORAL_TABLET | Freq: Two times a day (BID) | ORAL | 0 refills | Status: DC | PRN

## 2018-06-20 NOTE — Discharge Instructions (Signed)
Living With Heart Failure  Heart failure is a long-term (chronic) condition in which the heart cannot pump enough blood through the body. When this happens, parts of the body do not get the blood and oxygen they need. There is no cure for heart failure at this time, so it is important for you to take good care of yourself and follow the treatment plan set by your health care provider. If you are living with heart failure, there are ways to help you manage the disease. Follow these instructions at home: Living with heart failure requires you to make changes in your life. Your health care team will teach you about the changes you need to make in order to relieve your symptoms and lower your risk of going to the hospital. Follow the treatment plan as set by your health care provider. Medicines Medicines are important in reducing your heart's workload, slowing the progression of heart failure, and improving your symptoms.  Take over-the-counter and prescription medicines only as told by your health care provider.  Do not stop taking your medicine unless your health care provider tells you to do that.  Do not skip any dose of your medicine.  Refill prescriptions before you run out of medicine. You need your medicines every day. Eating and drinking   Eat heart-healthy foods. Talk with a dietitian to make an eating plan that is right for you. ? If directed by your health care provider: ? Limit salt (sodium). Lowering your sodium intake may reduce symptoms of heart failure. Ask a dietitian to recommend heart-healthy seasonings. ? Limit your fluid intake. Fluid restriction may reduce symptoms of heart failure. ? Use low-fat cooking methods instead of frying. Low-fat methods include roasting, grilling, broiling, baking, poaching, steaming, and stir-frying. ? Choose foods that contain no trans fat and are low in saturated fat and cholesterol. Healthy choices include fresh or frozen fruits and vegetables,  fish, lean meats, legumes, fat-free or low-fat dairy products, and whole-grain or high-fiber foods.  Limit alcohol intake to no more than 1 drink a day for nonpregnant women and 2 drinks a day for men. One drink equals 12 oz of beer, 5 oz of wine, or 1 oz of hard liquor. ? Drinking more than that is harmful to your heart. Tell your health care provider if you drink alcohol several times a week. ? Talk with your health care provider about whether any level of alcohol use is safe for you. Activity   Ask your health care provider about attending cardiac rehabilitation. These programs include aerobic physical activity, which provides many benefits for your heart.  If no cardiac rehabilitation program is available, ask your health care provider what aerobic exercises are safe for you to do. Lifestyle Make the lifestyle changes recommended by your health care provider. In general:  Lose weight if your health care provider tells you to do that. Weight loss may reduce symptoms of heart failure.  Do not use any products that contain nicotine or tobacco, such as cigarettes or e-cigarettes. If you need help quitting, ask your health care provider.  Do not use street (illegal) drugs.  Return to your normal activities as told by your health care provider. Ask your health care provider what activities are safe for you. General instructions   Make sure you weigh yourself every day to track your weight. Rapid weight gain may indicate an increase in fluid in your body and may increase the workload of your heart. ? Weigh yourself every morning.  Do this after you urinate but before you eat breakfast. ? Wear the same type of clothing, without shoes, each time you weigh yourself. ? Weigh yourself on the same scale and in the same spot each time.  Living with chronic heart failure often leads to emotions such as fear, stress, anxiety, and depression. If you feel any of these emotions and need help coping,  contact your health care provider. Other ways to get help include: ? Talking to friends and family members about your condition. They can give you support and guidance. Explain your symptoms to them and, if comfortable, invite them to attend appointments or rehabilitation with you. ? Joining a support group for people with chronic heart failure. Talking with other people who have the same symptoms may give you new ways of coping with your disease and your emotions.  Stay up to date with your shots (vaccines). Staying current on pneumococcal and influenza vaccines is especially important in preventing germs from attacking your airways (respiratory infections).  Keep all follow-up visits as told by your health care provider. This is important. How to recognize changes in your condition You and your family members need to know what changes to watch for in your condition. Watch for the following changes and report them to your health care provider:  Sudden weight gain. Ask your health care provider what amount of weight gain to report.  Shortness of breath: ? Feeling short of breath while at rest, with no exercise or activity that required great effort. ? Feeling breathless with activity.  Swelling of your lower legs or ankles.  Difficulty sleeping: ? You wake up feeling short of breath. ? You have to use more pillows to raise your head in order to sleep.  Frequent, dry, hacking cough.  Loss of appetite.  Feeling more tired all the time.  Depression or feelings of sadness or hopelessness.  Bloating in the stomach. Where to find more information  Local support groups. Ask your health care provider about groups near you.  The American Heart Association: www.heart.org Contact a health care provider if:  You have a rapid weight gain.  You have increasing shortness of breath that is unusual for you.  You are unable to participate in your usual physical activities.  You tire  easily.  You cough more than normal, especially with physical activity.  You have any swelling or more swelling in areas such as your hands, feet, ankles, or abdomen.  You feel like your heart is beating quickly (palpitations).  You become dizzy or light-headed when you stand up. Get help right away if:  You have difficulty breathing.  You notice or your family notices a change in your awareness, such as having trouble staying awake or having difficulty with concentration.  You have pain or discomfort in your chest.  You have an episode of fainting (syncope). Summary  There is no cure for heart failure, so it is important for you to take good care of yourself and follow the treatment plan set by your health care provider.  Medicines are important in reducing your heart's workload, slowing the progression of heart failure, and improving your symptoms.  Living with chronic heart failure often leads to emotions such as fear, stress, anxiety, and depression. If you are feeling any of these emotions and need help coping, contact your health care provider. This information is not intended to replace advice given to you by your health care provider. Make sure you discuss any questions you  have with your health care provider. Document Released: 05/11/2016 Document Revised: 05/11/2016 Document Reviewed: 05/11/2016 Elsevier Interactive Patient Education  2019 Elsevier Inc. Heart Failure Heart failure is a condition in which the heart has trouble pumping blood because it has become weak or stiff. This means that the heart does not pump blood efficiently for the body to work well. For some people with heart failure, fluid may back up into the lungs and there may be swelling (edema) in the lower legs. Heart failure is usually a long-term (chronic) condition. It is important for you to take good care of yourself and follow the treatment plan from your health care provider. What are the causes? This  condition is caused by some health problems, including:  High blood pressure (hypertension). Hypertension causes the heart muscle to work harder than normal. High blood pressure eventually causes the heart to become stiff and weak.  Coronary artery disease (CAD). CAD is the buildup of cholesterol and fat (plaques) in the arteries of the heart.  Heart attack (myocardial infarction). Injured tissue, which is caused by the heart attack, does not contract as well and the heart's ability to pump blood is weakened.  Abnormal heart valves. When the heart valves do not open and close properly, the heart muscle must pump harder to keep the blood flowing.  Heart muscle disease (cardiomyopathy or myocarditis). Heart muscle disease is damage to the heart muscle from a variety of causes, such as drug or alcohol abuse, infections, or unknown causes. These can increase the risk of heart failure.  Lung disease. When the lungs do not work properly, the heart must work harder. What increases the risk? Risk of heart failure increases as a person ages. This condition is also more likely to develop in people who:  Are overweight.  Are female.  Smoke or chew tobacco.  Abuse alcohol or illegal drugs.  Have taken medicines that can damage the heart, such as chemotherapy drugs.  Have diabetes. ? High blood sugar (glucose) is associated with high fat (lipid) levels in the blood. ? Diabetes can also damage tiny blood vessels that carry nutrients to the heart muscle.  Have abnormal heart rhythms.  Have thyroid problems.  Have low blood counts (anemia). What are the signs or symptoms? Symptoms of this condition include:  Shortness of breath with activity, such as when climbing stairs.  Persistent cough.  Swelling of the feet, ankles, legs, or abdomen.  Unexplained weight gain.  Difficulty breathing when lying flat (orthopnea).  Waking from sleep because of the need to sit up and get more  air.  Rapid heartbeat.  Fatigue and loss of energy.  Feeling light-headed, dizzy, or close to fainting.  Loss of appetite.  Nausea.  Increased urination during the night (nocturia).  Confusion. How is this diagnosed? This condition is diagnosed based on:  Medical history, symptoms, and a physical exam.  Diagnostic tests, which may include: ? Echocardiogram. ? Electrocardiogram (ECG). ? Chest X-ray. ? Blood tests. ? Exercise stress test. ? Radionuclide scans. ? Cardiac catheterization and angiogram. How is this treated? Treatment for this condition is aimed at managing the symptoms of heart failure. Medicines, behavioral changes, or other treatments may be necessary to treat heart failure. Medicines These may include:  Angiotensin-converting enzyme (ACE) inhibitors. This type of medicine blocks the effects of a blood protein called angiotensin-converting enzyme. ACE inhibitors relax (dilate) the blood vessels and help to lower blood pressure.  Angiotensin receptor blockers (ARBs). This type of medicine blocks the  actions of a blood protein called angiotensin. ARBs dilate the blood vessels and help to lower blood pressure.  Water pills (diuretics). Diuretics cause the kidneys to remove salt and water from the blood. The extra fluid is removed through urination, leaving a lower volume of blood that the heart has to pump.  Beta blockers. These improve heart muscle strength and they prevent the heart from beating too quickly.  Digoxin. This increases the force of the heartbeat. Healthy behavior changes These may include:  Reaching and maintaining a healthy weight.  Stopping smoking or chewing tobacco.  Eating heart-healthy foods.  Limiting or avoiding alcohol.  Stopping use of street drugs (illegal drugs).  Physical activity. Other treatments These may include:  Surgery to open blocked coronary arteries or repair damaged heart valves.  Placement of a  biventricular pacemaker to improve heart muscle function (cardiac resynchronization therapy). This device paces both the right ventricle and left ventricle.  Placement of a device to treat serious abnormal heart rhythms (implantable cardioverter defibrillator, or ICD).  Placement of a device to improve the pumping ability of the heart (left ventricular assist device, or LVAD).  Heart transplant. This can cure heart failure, and it is considered for certain patients who do not improve with other therapies. Follow these instructions at home: Medicines  Take over-the-counter and prescription medicines only as told by your health care provider. Medicines are important in reducing the workload of your heart, slowing the progression of heart failure, and improving your symptoms. ? Do not stop taking your medicine unless your health care provider told you to do that. ? Do not skip any dose of medicine. ? Refill your prescriptions before you run out of medicine. You need your medicines every day. Eating and drinking   Eat heart-healthy foods. Talk with a dietitian to make an eating plan that is right for you. ? Choose foods that contain no trans fat and are low in saturated fat and cholesterol. Healthy choices include fresh or frozen fruits and vegetables, fish, lean meats, legumes, fat-free or low-fat dairy products, and whole-grain or high-fiber foods. ? Limit salt (sodium) if directed by your health care provider. Sodium restriction may reduce symptoms of heart failure. Ask a dietitian to recommend heart-healthy seasonings. ? Use healthy cooking methods instead of frying. Healthy methods include roasting, grilling, broiling, baking, poaching, steaming, and stir-frying.  Limit your fluid intake if directed by your health care provider. Fluid restriction may reduce symptoms of heart failure. Lifestyle   Stop smoking or using chewing tobacco. Nicotine and tobacco can damage your heart and your  blood vessels. Do not use nicotine gum or patches before talking to your health care provider.  Limit alcohol intake to no more than 1 drink per day for non-pregnant women and 2 drinks per day for men. One drink equals 12 oz of beer, 5 oz of wine, or 1 oz of hard liquor. ? Drinking more than that is harmful to your heart. Tell your health care provider if you drink alcohol several times a week. ? Talk with your health care provider about whether any level of alcohol use is safe for you. ? If your heart has already been damaged by alcohol or you have severe heart failure, drinking alcohol should be stopped completely.  Stop use of illegal drugs.  Lose weight if directed by your health care provider. Weight loss may reduce symptoms of heart failure.  Do moderate physical activity if directed by your health care provider. People who are  elderly and people with severe heart failure should consult with a health care provider for physical activity recommendations. Monitor important information   Weigh yourself every day. Keeping track of your weight daily helps you to notice excess fluid sooner. ? Weigh yourself every morning after you urinate and before you eat breakfast. ? Wear the same amount of clothing each time you weigh yourself. ? Record your daily weight. Provide your health care provider with your weight record.  Monitor and record your blood pressure as told by your health care provider.  Check your pulse as told by your health care provider. Dealing with extreme temperatures  If the weather is extremely hot: ? Avoid vigorous physical activity. ? Use air conditioning or fans or seek a cooler location. ? Avoid caffeine and alcohol. ? Wear loose-fitting, lightweight, and light-colored clothing.  If the weather is extremely cold: ? Avoid vigorous physical activity. ? Layer your clothes. ? Wear mittens or gloves, a hat, and a scarf when you go outside. ? Avoid alcohol. General  instructions  Manage other health conditions such as hypertension, diabetes, thyroid disease, or abnormal heart rhythms as told by your health care provider.  Learn to manage stress. If you need help to do this, ask your health care provider.  Plan rest periods when fatigued.  Get ongoing education and support as needed.  Participate in or seek rehabilitation as needed to maintain or improve independence and quality of life.  Stay up to date with immunizations. Keeping current on pneumococcal and influenza immunizations is especially important to prevent respiratory infections.  Keep all follow-up visits as told by your health care provider. This is important. Contact a health care provider if:  You have a rapid weight gain.  You have increasing shortness of breath that is unusual for you.  You are unable to participate in your usual physical activities.  You tire easily.  You cough more than normal, especially with physical activity.  You have any swelling or more swelling in areas such as your hands, feet, ankles, or abdomen.  You are unable to sleep because it is hard to breathe.  You feel like your heart is beating quickly (palpitations).  You become dizzy or light-headed when you stand up. Get help right away if:  You have difficulty breathing.  You notice or your family notices a change in your awareness, such as having trouble staying awake or having difficulty with concentration.  You have pain or discomfort in your chest.  You have an episode of fainting (syncope). This information is not intended to replace advice given to you by your health care provider. Make sure you discuss any questions you have with your health care provider. Document Released: 12/27/2004 Document Revised: 11/25/2016 Document Reviewed: 07/22/2015 Elsevier Interactive Patient Education  [employer].

## 2018-06-20 NOTE — Progress Notes (Signed)
Urine culture came back with proteus and kleb. She is now on D4 of ceftriaxone now. Ok to change to 1 more day of abx with keflex per Dr [place]  Onnie Boer, PharmD, BCIDP, AAHIVP, CPP Infectious Disease Pharmacist 06/20/2018 10:45 AM

## 2018-06-20 NOTE — Discharge Summary (Signed)
Physician Discharge Summary  Jillene Wehrenberg [facility] FGH:[id number] DOB: 1940/07/03 DOA: 06/16/2018  PCP: Burnard Bunting, MD  Admit date: 06/16/2018 Discharge date: 06/20/2018  Admitted From: Home [facility]  Recommendations for Outpatient Follow-up:  1. Follow up with PCP in 1 week 2. Monitor daily weights 3. Encourage low-salt diet, heart healthy, consistent carbohydrate diet 4. Recommend fluid restriction to 1200 cc/day 5. Monitor weights and adjust diuretic as needed  Home Health: N/A Equipment/Devices: Oxygen 2 L per nasal cannula, CPAP with pressure set at 10 mmHg H2O  Discharge Condition: Stable CODE STATUS: Full code Diet recommendation: Heart Healthy / Carb Modified   History of present illness:  TZIREL LEONOR is a 78 y.o. female with medical history significant of type 2 diabetes, chronic diastolic congestive heart failure, CKD 4, hypertension, hyperlipidemia, COPD on home oxygen, GERD, OSA presenting to the hospital for evaluation of generalized weakness and hyperglycemia.  Patient states her friend asked her to come into the hospital.  Reports having chronic shortness of breath and cough secondary to COPD, no recent change.  Denies any fevers, chills, chest pain, nausea, vomiting, abdominal pain, diarrhea, or dysuria.  States she takes Lantus 20 units at bedtime and Humalog 75/25 20 units once daily in the morning.  States she takes torsemide twice a day for the swelling in her legs.  She believes the swelling is worse than her baseline.  In the ED, patient was afebrile and hemodynamically stable.  Blood glucose 800.  Bicarb 18 and anion gap 12.  BUN 74.  Creatinine 2.6, at baseline.  LFTs normal.  White count 14.7.  Lactic acid normal.  Hemoglobin 8.7, baseline in the 8-9 range.  UA with evidence of glucosuria but no ketones.  Also showing moderate amount of leukocytes, greater than 50 WBCs, and many bacteria.  Troponin 0.09.  EKG without acute  ischemic changes.  Chest x-ray showing mild cardiomegaly and mild vascular congestion which is improved.  Patient was started on insulin infusion and received Zosyn in the ED.  Hospital course:   Discharge Diagnoses:  Principal Problem:   Hyperglycemia Active Problems:   CKD (chronic kidney disease) stage 4, GFR 15-29 ml/min (HCC)   Type 2 diabetes mellitus (HCC)   UTI (urinary tract infection)   Sacral decubitus ulcer   Diabetic hyperosmolar non-ketotic state ([place])  Acute on chronic diastolic congestive heart failure, class III Grade 2 diastolic dysfunction Resenting with generalized weakness with progressive shortness of breath and lower extremity edema.  BNP was elevated at 194.2.  Chest x-ray notable for cardiomegaly with mild vascular congestion.  Patient was started on IV diuresis with furosemide.  She was started on a low-salt, heart healthy diet with 1200 mL fluid restriction.  Patient did well with a net negative fluid balance of 2.7 L during hospitalization.  Weight initially was 90.7 kg and was 83.9 kg at time of discharge.  Patient may resume her home torsemide 50 mg p.o. twice daily.  Recommend continuing to monitor her daily weights and adjust her home regimen as necessary.  Urinary tract infection with Proteus and Klebsiella Urine culture positive for Proteus and Klebsiella.  Was initially on IV ceftriaxone.  Will complete a 5-day course with Keflex, anticipate course completion on 06/21/2018.  Hyperosmolar nonketotic hyperglycemia Insulin-dependent diabetes mellitus type 2 Patient was noted to have an elevated glucose of 800 on admission.  On 06/16/2018 was 9.9, poorly controlled.  Patient will discharge home on Lantus 30 units qHS and NovoLog 6  units 3 times daily with meals.  Continue to monitor glucose and adjust insulin regimen as required.  COPD with chronic hypoxia on 2 L nasal cannula Continue home nebs as needed.  Would likely benefit from CPAP nocturnally given her  heart failure, although she declined while inpatient.  Recommended CPAP pressure setting of 10 mmHg H2O.  Chronic kidney disease stage IV Baseline creatinine 2.5.  Currently appears to be at baseline.    Creatinine at time of discharge 2.35.  Essential hypertension Continue amlodipine 10 mg p.o. daily  Hyperlipidemia Continue Zocor 10 mg p.o. daily  Chronic venous stasis ulcer Continue to monitor wound with dressing changes  Generalized weakness Physical therapy recommends skilled nursing facility.  [facility] to [facility], SNF for further treatment.  Sacral decubitus ulcer, unstageable, Present prior to admission. Evaluated by wound care while inpatient, recommend continue offloading and wound care.   Discharge Instructions  Discharge Instructions    (HEART FAILURE PATIENTS) Call MD:  Anytime you have any of the following symptoms: 1) 3 pound weight gain in 24 hours or 5 pounds in 1 week 2) shortness of breath, with or without a dry hacking cough 3) swelling in the hands, feet or stomach 4) if you have to sleep on extra pillows at night in order to breathe.   Complete by:  As directed    Call MD for:  difficulty breathing, headache or visual disturbances   Complete by:  As directed    Call MD for:  extreme fatigue   Complete by:  As directed    Call MD for:  persistant dizziness or light-headedness   Complete by:  As directed    Call MD for:  persistant nausea and vomiting   Complete by:  As directed    Call MD for:  severe uncontrolled pain   Complete by:  As directed    Call MD for:  temperature >100.4   Complete by:  As directed    Diet - low sodium heart healthy   Complete by:  As directed    Increase activity slowly   Complete by:  As directed      Allergies as of 06/20/2018      Reactions   Other Other (See Comments)   "all generics make her sick"      Medication List    STOP taking these medications   budesonide 0.25 MG/2ML nebulizer  solution Commonly known as:  Pulmicort   fluticasone 50 MCG/ACT nasal spray Commonly known as:  FLONASE   HumaLOG Mix 75/25 KwikPen (75-25) 100 UNIT/ML Kwikpen Generic drug:  Insulin Lispro Prot & Lispro     TAKE these medications   amLODipine 5 MG tablet Commonly known as:  NORVASC Take 10 mg by mouth daily.   calcitRIOL 0.25 MCG capsule Commonly known as:  ROCALTROL Take 1 capsule (0.25 mcg total) by mouth every Monday, Wednesday, and Friday.   Caltrate 600+D 600-400 MG-UNIT tablet Generic drug:  Calcium Carbonate-Vitamin D Take 1 tablet by mouth daily.   cephALEXin 500 MG capsule Commonly known as:  KEFLEX Take 1 capsule (500 mg total) by mouth every 12 (twelve) hours for 1 day. Start taking on:  June 21, 2018   cyclobenzaprine 10 MG tablet Commonly known as:  FLEXERIL Take 10 mg 3 (three) times daily as needed by mouth for muscle spasms.   feeding supplement (PRO-STAT SUGAR FREE 64) Liqd Take 30 mLs by mouth 2 (two) times daily.   Fish Oil 1000 MG Caps  Take 1,000 mg by mouth daily.   folic acid 268 MCG tablet Commonly known as:  FOLVITE Take 400 mcg by mouth daily.   insulin aspart 100 UNIT/ML injection Commonly known as:  novoLOG Inject 6 Units into the skin 3 (three) times daily with meals.   insulin glargine 100 UNIT/ML injection Commonly known as:  LANTUS Inject 0.3 mLs (30 Units total) into the skin at bedtime. What changed:  how much to take   ipratropium-albuterol 0.5-2.5 (3) MG/3ML Soln Commonly known as:  DuoNeb Take 3 mLs by nebulization every 6 (six) hours as needed. What changed:  reasons to take this   iron polysaccharides 150 MG capsule Commonly known as:  NIFEREX Take 150 mg by mouth 2 (two) times daily.   LORazepam 1 MG tablet Commonly known as:  ATIVAN Take 0.5 tablets (0.5 mg total) by mouth 2 (two) times daily as needed for anxiety or sleep. Take 0.5 tablet every morning and 1 tablet in the evening as needed for sleep What changed:     how much to take  when to take this  reasons to take this   magnesium oxide 400 MG tablet Commonly known as:  MAG-OX Take 400 mg by mouth daily.   montelukast 10 MG tablet Commonly known as:  Singulair Take 1 tablet (10 mg total) by mouth at bedtime.   prochlorperazine 5 MG tablet Commonly known as:  COMPAZINE Take 5 mg by mouth every 6 (six) hours as needed for nausea.   pyridOXINE 100 MG tablet Commonly known as:  VITAMIN B-6 Take 100 mg by mouth daily.   Systane Balance 0.6 % Soln Generic drug:  Propylene Glycol Apply 1 drop to eye 2 (two) times daily as needed (dry eyes).   torsemide 10 MG tablet Commonly known as:  DEMADEX Take 5 tablets (50 mg total) by mouth 2 (two) times daily.   traZODone 50 MG tablet Commonly known as:  DESYREL Take 50 mg at bedtime as needed by mouth for sleep.   vitamin B-12 1000 MCG tablet Commonly known as:  CYANOCOBALAMIN Take 1,000 mcg by mouth daily.   Vitamin D (Ergocalciferol) 1.25 MG (50000 UT) Caps capsule Commonly known as:  DRISDOL Take 50,000 Units by mouth 2 (two) times a week.   vitamin E 400 UNIT capsule Take 400 Units by mouth daily.   Zocor 20 MG tablet Generic drug:  simvastatin Take 20 mg by mouth daily.       Allergies  Allergen Reactions  . Other Other (See Comments)    "all generics make her sick"    Consultations:  none   Procedures/Studies: Dg Chest Port 1 View  Result Date: 06/16/2018 CLINICAL DATA:  Weakness 1 week. Swelling bilateral lower extremities. EXAM: PORTABLE CHEST 1 VIEW COMPARISON:  04/03/2017 FINDINGS: Patient is rotated to the left. Lungs are adequately inflated with subtle hazy opacification of the central pulmonary vasculature likely mild vascular congestion which is improved. No evidence of effusion or lobar consolidation. Mild cardiomegaly. Remainder of the exam is unchanged. IMPRESSION: Mild cardiomegaly with interval improved mild vascular congestion. Electronically Signed    By: Marin Olp M.D.   On: 06/16/2018 21:01     Transthoracic echocardiogram 06/19/2018: IMPRESSIONS  1. The left ventricle has hyperdynamic systolic function, with an ejection fraction of >65%. The cavity size was normal. There is mildly increased left ventricular wall thickness. Left ventricular diastolic Doppler parameters are consistent with  pseudonormalization. Elevated left atrial and left ventricular end-diastolic pressures No evidence of  left ventricular regional wall motion abnormalities.  2. The right ventricle has normal systolic function. The cavity was mildly enlarged. There is no increase in right ventricular wall thickness. Right ventricular systolic pressure is severely elevated with an estimated pressure of 79.2 mmHg.  3. Left atrial size was severely dilated.  4. Right atrial size was moderately dilated.  5. Tricuspid valve regurgitation is mild-moderate.  6. The aortic valve is tricuspid. Severely thickening of the aortic valve. Severe calcifcation of the aortic valve. Aortic valve regurgitation is mild by color flow Doppler. Moderate stenosis of the aortic valve. AV Mean Grad: 25.0 mmHg. AV Area (VTI):  1.54 cm. AV Vmax: 365.50 cm/s  7. The inferior vena cava was dilated in size with >50% respiratory variability.   Subjective: Patient seen and examined at bedside, resting comfortably.  No complaints this morning.  Awaiting insurance authorization for SNF placement.  Continues with good urine output.  Denies headache, no fever/chills/night sweats, no cough/congestion, no nausea/vomiting/diarrhea, no chest pain, palpitations, no shortness of breath, no abdominal pain, no paresthesias.  No acute events overnight per nursing staff.   Discharge Exam: Vitals:   06/20/18 0849 06/20/18 1340  BP:  (!) 149/97  Pulse:  72  Resp:  18  Temp:  98.2 F (36.8 C)  SpO2: 95% 97%   Vitals:   06/20/18 0405 06/20/18 0847 06/20/18 0849 06/20/18 1340  BP: (!) 148/68   (!) 149/97   Pulse: 64   72  Resp: (!) 22   18  Temp: 99 F (37.2 C)   98.2 F (36.8 C)  TempSrc: Oral   Oral  SpO2: 98% 95% 95% 97%  Weight:      Height:        General: Pt is alert, awake, not in acute distress Cardiovascular: RRR, S1/S2 +, no rubs, no gallops Respiratory: CTA bilaterally, no wheezing, no rhonchi, on 2 L nasal cannula which is her baseline Abdominal: Soft, NT, ND, bowel sounds + Extremities: no edema, no cyanosis    The results of significant diagnostics from this hospitalization (including imaging, microbiology, ancillary and laboratory) are listed below for reference.     Microbiology: Recent Results (from the past 240 hour(s))  Urine culture     Status: Abnormal   Collection Time: 06/16/18  8:18 PM  Result Value Ref Range Status   Specimen Description   Final    Urine Performed at [facility] [address] [place], [place] [address]    Special Requests   Final    NONE Performed at [facility], [place] [address], [place], [place] [address]    Culture (A)  Final    >=100,000 COLONIES/mL KLEBSIELLA PNEUMONIAE 50,000 COLONIES/mL PROTEUS MIRABILIS    Report Status 06/20/2018 FINAL  Final   Organism ID, Bacteria KLEBSIELLA PNEUMONIAE (A)  Final   Organism ID, Bacteria PROTEUS MIRABILIS (A)  Final      Susceptibility   Klebsiella pneumoniae - MIC*    AMPICILLIN >=32 RESISTANT Resistant     CEFAZOLIN <=4 SENSITIVE Sensitive     CEFEPIME <=1 SENSITIVE Sensitive     CEFTAZIDIME <=1 SENSITIVE Sensitive     CEFTRIAXONE <=1 SENSITIVE Sensitive     CIPROFLOXACIN <=0.25 SENSITIVE Sensitive     GENTAMICIN <=1 SENSITIVE Sensitive     IMIPENEM <=0.25 SENSITIVE Sensitive     TRIMETH/SULFA <=20 SENSITIVE Sensitive     AMPICILLIN/SULBACTAM 4 SENSITIVE Sensitive     PIP/TAZO <=4 SENSITIVE Sensitive     Extended  ESBL NEGATIVE Sensitive     * >=100,000 COLONIES/mL KLEBSIELLA PNEUMONIAE   Proteus mirabilis - MIC*    AMPICILLIN <=2  SENSITIVE Sensitive     CEFAZOLIN <=4 SENSITIVE Sensitive     CEFEPIME <=1 SENSITIVE Sensitive     CEFTAZIDIME <=1 SENSITIVE Sensitive     CEFTRIAXONE <=1 SENSITIVE Sensitive     CIPROFLOXACIN <=0.25 SENSITIVE Sensitive     GENTAMICIN <=1 SENSITIVE Sensitive     IMIPENEM 8 INTERMEDIATE Intermediate     TRIMETH/SULFA <=20 SENSITIVE Sensitive     AMPICILLIN/SULBACTAM <=2 SENSITIVE Sensitive     PIP/TAZO <=4 SENSITIVE Sensitive     * 50,000 COLONIES/mL PROTEUS MIRABILIS  SARS Coronavirus 2 (CEPHEID- Performed in [place] hospital lab), Hosp Order     Status: None   Collection Time: 06/16/18  9:28 PM  Result Value Ref Range Status   SARS Coronavirus 2 NEGATIVE NEGATIVE Final    Comment: (NOTE) If result is NEGATIVE SARS-CoV-2 target nucleic acids are NOT DETECTED. The SARS-CoV-2 RNA is generally detectable in upper and lower  respiratory specimens during the acute phase of infection. The lowest  concentration of SARS-CoV-2 viral copies this assay can detect is 250  copies / mL. A negative result does not preclude SARS-CoV-2 infection  and should not be used as the sole basis for treatment or other  patient management decisions.  A negative result may occur with  improper specimen collection / handling, submission of specimen other  than nasopharyngeal swab, presence of viral mutation(s) within the  areas targeted by this assay, and inadequate number of viral copies  (<250 copies / mL). A negative result must be combined with clinical  observations, patient history, and epidemiological information. If result is POSITIVE SARS-CoV-2 target nucleic acids are DETECTED. The SARS-CoV-2 RNA is generally detectable in upper and lower  respiratory specimens dur ing the acute phase of infection.  Positive  results are indicative of active infection with SARS-CoV-2.  Clinical  correlation with patient history and other diagnostic information is  necessary to determine patient infection  status.  Positive results do  not rule out bacterial infection or co-infection with other viruses. If result is PRESUMPTIVE POSTIVE SARS-CoV-2 nucleic acids MAY BE PRESENT.   A presumptive positive result was obtained on the submitted specimen  and confirmed on repeat testing.  While 2019 novel coronavirus  (SARS-CoV-2) nucleic acids may be present in the submitted sample  additional confirmatory testing may be necessary for epidemiological  and / or clinical management purposes  to differentiate between  SARS-CoV-2 and other Sarbecovirus currently known to infect humans.  If clinically indicated additional testing with an alternate test  methodology [id number]) is advised. The SARS-CoV-2 RNA is generally  detectable in upper and lower respiratory sp ecimens during the acute  phase of infection. The expected result is Negative. Fact Sheet for Patients:  StrictlyIdeas.no Fact Sheet for Healthcare Providers: BankingDealers.co.za This test is not yet approved or cleared by the [place] FDA and has been authorized for detection and/or diagnosis of SARS-CoV-2 by FDA under an Emergency Use Authorization (EUA).  This EUA will remain in effect (meaning this test can be used) for the duration of the COVID-19 declaration under Section 564(b)(1) of the Act, 21 U.S.C. section 360bbb-3(b)(1), unless the authorization is terminated or revoked sooner. Performed at [facility], [place] [address], [place], [place] [address]   SARS Coronavirus 2 (CEPHEID - Performed in [place] hospital lab), [facility] Order     Status:  None   Collection Time: 06/19/18  5:55 PM  Result Value Ref Range Status   SARS Coronavirus 2 NEGATIVE NEGATIVE Final    Comment: (NOTE) If result is NEGATIVE SARS-CoV-2 target nucleic acids are NOT DETECTED. The SARS-CoV-2 RNA is generally detectable in upper and lower  respiratory specimens during the acute phase of  infection. The lowest  concentration of SARS-CoV-2 viral copies this assay can detect is 250  copies / mL. A negative result does not preclude SARS-CoV-2 infection  and should not be used as the sole basis for treatment or other  patient management decisions.  A negative result may occur with  improper specimen collection / handling, submission of specimen other  than nasopharyngeal swab, presence of viral mutation(s) within the  areas targeted by this assay, and inadequate number of viral copies  (<250 copies / mL). A negative result must be combined with clinical  observations, patient history, and epidemiological information. If result is POSITIVE SARS-CoV-2 target nucleic acids are DETECTED. The SARS-CoV-2 RNA is generally detectable in upper and lower  respiratory specimens dur ing the acute phase of infection.  Positive  results are indicative of active infection with SARS-CoV-2.  Clinical  correlation with patient history and other diagnostic information is  necessary to determine patient infection status.  Positive results do  not rule out bacterial infection or co-infection with other viruses. If result is PRESUMPTIVE POSTIVE SARS-CoV-2 nucleic acids MAY BE PRESENT.   A presumptive positive result was obtained on the submitted specimen  and confirmed on repeat testing.  While 2019 novel coronavirus  (SARS-CoV-2) nucleic acids may be present in the submitted sample  additional confirmatory testing may be necessary for epidemiological  and / or clinical management purposes  to differentiate between  SARS-CoV-2 and other Sarbecovirus currently known to infect humans.  If clinically indicated additional testing with an alternate test  methodology [phone]) is advised. The SARS-CoV-2 RNA is generally  detectable in upper and lower respiratory sp ecimens during the acute  phase of infection. The expected result is Negative. Fact Sheet for Patients:   StrictlyIdeas.no Fact Sheet for Healthcare Providers: BankingDealers.co.za This test is not yet approved or cleared by the [place] FDA and has been authorized for detection and/or diagnosis of SARS-CoV-2 by FDA under an Emergency Use Authorization (EUA).  This EUA will remain in effect (meaning this test can be used) for the duration of the COVID-19 declaration under Section 564(b)(1) of the Act, 21 U.S.C. section 360bbb-3(b)(1), unless the authorization is terminated or revoked sooner. Performed at [facility], [place] [address], [place], [place] [address]      Labs: BNP (last 3 results) Recent Labs    06/17/18 1601 06/20/18 0707  BNP 194.2* 354.5*   Basic Metabolic Panel: Recent Labs  Lab 06/17/18 0740 06/17/18 1601 06/18/18 0924 06/19/18 0430 06/20/18 0707  NA 143 141 140 138 141  K 4.0 3.6 3.6 3.9 3.8  CL 114* 110 111 107 105  CO2 21* 22 21* 22 25  GLUCOSE 156* 221* 274* 334* 183*  BUN 64* 65* 60* 62* 66*  CREATININE 2.32* 2.37* 2.42* 2.44* 2.35*  CALCIUM 8.7* 8.9 8.5* 8.6* 8.8*  MG  --   --   --   --  1.8   Liver Function Tests: Recent Labs  Lab 06/16/18 2017  AST 12*  ALT 16  ALKPHOS 115  BILITOT 0.7  PROT 7.6  ALBUMIN 2.9*   No results for input(s): LIPASE, AMYLASE in the  last 168 hours. No results for input(s): AMMONIA in the last 168 hours. CBC: Recent Labs  Lab 06/16/18 2015 06/17/18 0326  WBC 14.7* 13.5*  HGB 8.7* 7.5*  HCT 26.4* 22.6*  MCV 88.0 87.6  PLT 433* 351   Cardiac Enzymes: Recent Labs  Lab 06/16/18 2017 06/17/18 0010 06/17/18 0326  TROPONINI 0.09* 0.08* 0.09*   BNP: Invalid input(s): POCBNP CBG: Recent Labs  Lab 06/19/18 1147 06/19/18 1648 06/19/18 2129 06/20/18 0757 06/20/18 1142  GLUCAP 218* 175* 301* 153* 249*   D-Dimer No results for input(s): DDIMER in the last 72 hours. Hgb A1c No results for input(s): HGBA1C in the last 72  hours. Lipid Profile No results for input(s): CHOL, HDL, LDLCALC, TRIG, CHOLHDL, LDLDIRECT in the last 72 hours. Thyroid function studies No results for input(s): TSH, T4TOTAL, T3FREE, THYROIDAB in the last 72 hours.  Invalid input(s): FREET3 Anemia work up No results for input(s): VITAMINB12, FOLATE, FERRITIN, TIBC, IRON, RETICCTPCT in the last 72 hours. Urinalysis    Component Value Date/Time   COLORURINE YELLOW 06/16/2018 2016   APPEARANCEUR HAZY (A) 06/16/2018 2016   LABSPEC 1.011 06/16/2018 2016   PHURINE 5.0 06/16/2018 2016   GLUCOSEU >=500 (A) 06/16/2018 2016   HGBUR MODERATE (A) 06/16/2018 2016   BILIRUBINUR NEGATIVE 06/16/2018 2016   KETONESUR NEGATIVE 06/16/2018 2016   PROTEINUR NEGATIVE 06/16/2018 2016   UROBILINOGEN 0.2 12/16/2011 1717   NITRITE NEGATIVE 06/16/2018 2016   LEUKOCYTESUR MODERATE (A) 06/16/2018 2016   Sepsis Labs Invalid input(s): PROCALCITONIN,  WBC,  LACTICIDVEN Microbiology Recent Results (from the past 240 hour(s))  Urine culture     Status: Abnormal   Collection Time: 06/16/18  8:18 PM  Result Value Ref Range Status   Specimen Description   Final    Urine Performed at [facility], [place] [address], [place], [place] [address]    Special Requests   Final    NONE Performed at [facility], [place] [address], [place], [place] [address]    Culture (A)  Final    >=100,000 COLONIES/mL KLEBSIELLA PNEUMONIAE 50,000 COLONIES/mL PROTEUS MIRABILIS    Report Status 06/20/2018 FINAL  Final   Organism ID, Bacteria KLEBSIELLA PNEUMONIAE (A)  Final   Organism ID, Bacteria PROTEUS MIRABILIS (A)  Final      Susceptibility   Klebsiella pneumoniae - MIC*    AMPICILLIN >=32 RESISTANT Resistant     CEFAZOLIN <=4 SENSITIVE Sensitive     CEFEPIME <=1 SENSITIVE Sensitive     CEFTAZIDIME <=1 SENSITIVE Sensitive     CEFTRIAXONE <=1 SENSITIVE Sensitive     CIPROFLOXACIN <=0.25 SENSITIVE Sensitive     GENTAMICIN <=1 SENSITIVE  Sensitive     IMIPENEM <=0.25 SENSITIVE Sensitive     TRIMETH/SULFA <=20 SENSITIVE Sensitive     AMPICILLIN/SULBACTAM 4 SENSITIVE Sensitive     PIP/TAZO <=4 SENSITIVE Sensitive     Extended ESBL NEGATIVE Sensitive     * >=100,000 COLONIES/mL KLEBSIELLA PNEUMONIAE   Proteus mirabilis - MIC*    AMPICILLIN <=2 SENSITIVE Sensitive     CEFAZOLIN <=4 SENSITIVE Sensitive     CEFEPIME <=1 SENSITIVE Sensitive     CEFTAZIDIME <=1 SENSITIVE Sensitive     CEFTRIAXONE <=1 SENSITIVE Sensitive     CIPROFLOXACIN <=0.25 SENSITIVE Sensitive     GENTAMICIN <=1 SENSITIVE Sensitive     IMIPENEM 8 INTERMEDIATE Intermediate     TRIMETH/SULFA <=20 SENSITIVE Sensitive     AMPICILLIN/SULBACTAM <=2 SENSITIVE Sensitive     PIP/TAZO <=4 SENSITIVE  Sensitive     * 50,000 COLONIES/mL PROTEUS MIRABILIS  SARS Coronavirus 2 (CEPHEID- Performed in [place] hospital lab), Hosp Order     Status: None   Collection Time: 06/16/18  9:28 PM  Result Value Ref Range Status   SARS Coronavirus 2 NEGATIVE NEGATIVE Final    Comment: (NOTE) If result is NEGATIVE SARS-CoV-2 target nucleic acids are NOT DETECTED. The SARS-CoV-2 RNA is generally detectable in upper and lower  respiratory specimens during the acute phase of infection. The lowest  concentration of SARS-CoV-2 viral copies this assay can detect is 250  copies / mL. A negative result does not preclude SARS-CoV-2 infection  and should not be used as the sole basis for treatment or other  patient management decisions.  A negative result may occur with  improper specimen collection / handling, submission of specimen other  than nasopharyngeal swab, presence of viral mutation(s) within the  areas targeted by this assay, and inadequate number of viral copies  (<250 copies / mL). A negative result must be combined with clinical  observations, patient history, and epidemiological information. If result is POSITIVE SARS-CoV-2 target nucleic acids are DETECTED. The  SARS-CoV-2 RNA is generally detectable in upper and lower  respiratory specimens dur ing the acute phase of infection.  Positive  results are indicative of active infection with SARS-CoV-2.  Clinical  correlation with patient history and other diagnostic information is  necessary to determine patient infection status.  Positive results do  not rule out bacterial infection or co-infection with other viruses. If result is PRESUMPTIVE POSTIVE SARS-CoV-2 nucleic acids MAY BE PRESENT.   A presumptive positive result was obtained on the submitted specimen  and confirmed on repeat testing.  While 2019 novel coronavirus  (SARS-CoV-2) nucleic acids may be present in the submitted sample  additional confirmatory testing may be necessary for epidemiological  and / or clinical management purposes  to differentiate between  SARS-CoV-2 and other Sarbecovirus currently known to infect humans.  If clinically indicated additional testing with an alternate test  methodology [phone]) is advised. The SARS-CoV-2 RNA is generally  detectable in upper and lower respiratory sp ecimens during the acute  phase of infection. The expected result is Negative. Fact Sheet for Patients:  StrictlyIdeas.no Fact Sheet for Healthcare Providers: BankingDealers.co.za This test is not yet approved or cleared by the [place] FDA and has been authorized for detection and/or diagnosis of SARS-CoV-2 by FDA under an Emergency Use Authorization (EUA).  This EUA will remain in effect (meaning this test can be used) for the duration of the COVID-19 declaration under Section 564(b)(1) of the Act, 21 U.S.C. section 360bbb-3(b)(1), unless the authorization is terminated or revoked sooner. Performed at [facility], [place] [address], [place],  [address]   SARS Coronavirus 2 (CEPHEID - Performed in [place] hospital lab), Hosp Order     Status: None    Collection Time: 06/19/18  5:55 PM  Result Value Ref Range Status   SARS Coronavirus 2 NEGATIVE NEGATIVE Final    Comment: (NOTE) If result is NEGATIVE SARS-CoV-2 target nucleic acids are NOT DETECTED. The SARS-CoV-2 RNA is generally detectable in upper and lower  respiratory specimens during the acute phase of infection. The lowest  concentration of SARS-CoV-2 viral copies this assay can detect is 250  copies / mL. A negative result does not preclude SARS-CoV-2 infection  and should not be used as the sole basis for treatment or other  patient management decisions.  A  negative result may occur with  improper specimen collection / handling, submission of specimen other  than nasopharyngeal swab, presence of viral mutation(s) within the  areas targeted by this assay, and inadequate number of viral copies  (<250 copies / mL). A negative result must be combined with clinical  observations, patient history, and epidemiological information. If result is POSITIVE SARS-CoV-2 target nucleic acids are DETECTED. The SARS-CoV-2 RNA is generally detectable in upper and lower  respiratory specimens dur ing the acute phase of infection.  Positive  results are indicative of active infection with SARS-CoV-2.  Clinical  correlation with patient history and other diagnostic information is  necessary to determine patient infection status.  Positive results do  not rule out bacterial infection or co-infection with other viruses. If result is PRESUMPTIVE POSTIVE SARS-CoV-2 nucleic acids MAY BE PRESENT.   A presumptive positive result was obtained on the submitted specimen  and confirmed on repeat testing.  While 2019 novel coronavirus  (SARS-CoV-2) nucleic acids may be present in the submitted sample  additional confirmatory testing may be necessary for epidemiological  and / or clinical management purposes  to differentiate between  SARS-CoV-2 and other Sarbecovirus currently known to infect humans.   If clinically indicated additional testing with an alternate test  methodology [phone]) is advised. The SARS-CoV-2 RNA is generally  detectable in upper and lower respiratory sp ecimens during the acute  phase of infection. The expected result is Negative. Fact Sheet for Patients:  StrictlyIdeas.no Fact Sheet for Healthcare Providers: BankingDealers.co.za This test is not yet approved or cleared by the [place] FDA and has been authorized for detection and/or diagnosis of SARS-CoV-2 by FDA under an Emergency Use Authorization (EUA).  This EUA will remain in effect (meaning this test can be used) for the duration of the COVID-19 declaration under Section 564(b)(1) of the Act, 21 U.S.C. section 360bbb-3(b)(1), unless the authorization is terminated or revoked sooner. Performed at [facility], [place] [address] [place], [place] [address]      Time coordinating discharge: Over 30 minutes  SIGNED:    J [place] DO  [facility] 06/20/2018, 2:09 PM

## 2018-06-20 NOTE — Progress Notes (Signed)
Pt discharged to [facility] today per Dr. [place] Pt's IV site D/C'd and WDL. Pt's VSS. Report called to nurse at [facility]. Verbalized understanding. Pt left floor via stretcher accompanied by PTAR.

## 2018-06-20 NOTE — TOC Transition Note (Signed)
Transition of Care [facility]) - CM/SW Discharge Note   Patient Details  Name: NYAISHA SIMAO MRN: [id number] Date of Birth: [birth year]  Transition of Care [facility]) CM/SW Contact:  Dessa Phi, RN Phone Number: 06/20/2018, 2:37 PM   Clinical Narrative: d/c to [facility] aware of all paperwork signed,d/c summary & faxed-originals in packet for facility.PTAR called for 3:30p pick up-Forms in chart.Nurse to call report #[phone].No further CM needs.     Final next level of care: Skilled Nursing Facility Barriers to Discharge: No Barriers Identified   Patient Goals and CMS Choice Patient states their goals for this hospitalization and ongoing recovery are:: to go home again      Discharge Placement PASRR number recieved: 06/18/18            Patient chooses bed at: [facility]) Patient to be transferred to facility by: [place] Name of family member notified: Alex Gardener Patient and family notified of of transfer: 06/20/18  Discharge Plan and Services   Discharge Planning Services: CM Consult            DME Arranged: N/A DME Agency: NA       HH Arranged: NA [place] Agency: NA        Social Determinants of Health ([place]) Interventions     Readmission Risk Interventions Readmission Risk Prevention Plan 06/18/2018  Transportation Screening Complete  PCP or Specialist Appt within 3-5 Days Not Complete  Not Complete comments not yet ready for d/c  HRI or [place] Not Complete  HRI or Home Care Consult comments CM will follow for needs  Social Work Consult for [place] Planning/Counseling Complete  Palliative Care Screening Not Applicable  Medication Review Press photographer) Complete  Some recent data might be hidden

## 2018-06-20 NOTE — TOC Progression Note (Signed)
Transition of Care [facility]) - Progression Note    Patient Details  Name: [name] MRN: [id number] Date of Birth: 1940/06/21  Transition of Care [facility]) CM/SW Contact  Carisha Kantor, Juliann Pulse, RN Phone Number: 06/20/2018, 10:44 AM  Clinical Narrative: Awaiting insurance auth from [place] following.Will need CPAP setting in d/c summary.     Expected Discharge Plan: Skilled Nursing Facility Barriers to Discharge: Insurance Authorization  Expected Discharge Plan and Services Expected Discharge Plan: [place]   Discharge Planning Services: CM Consult   Living arrangements for the past 2 months: Single Family Home                 DME Arranged: N/A DME Agency: NA       HH Arranged: NA HH Agency: NA         Social Determinants of Health (SDOH) Interventions    Readmission Risk Interventions Readmission Risk Prevention Plan 06/18/2018  Transportation Screening Complete  PCP or Specialist Appt within 3-5 Days Not Complete  Not Complete comments not yet ready for d/c  HRI or [place] Not Complete  HRI or Home Care Consult comments CM will follow for needs  Social Work Consult for [place] Planning/Counseling Complete  Palliative Care Screening Not Applicable  Medication Review Press photographer) Complete  Some recent data might be hidden

## 2018-07-05 ENCOUNTER — Emergency Department (HOSPITAL_COMMUNITY): Payer: Medicare Other

## 2018-07-05 ENCOUNTER — Inpatient Hospital Stay (HOSPITAL_COMMUNITY)
Admission: EM | Admit: 2018-07-05 | Discharge: 2018-07-10 | DRG: 682 | Disposition: A | Discharge status: Skilled Nursing Facility | Payer: Medicare Other | Source: Skilled Nursing Facility | Attending: Internal Medicine | Admitting: Internal Medicine

## 2018-07-05 ENCOUNTER — Other Ambulatory Visit: Payer: Self-pay

## 2018-07-05 ENCOUNTER — Encounter (HOSPITAL_COMMUNITY): Payer: Self-pay

## 2018-07-05 DIAGNOSIS — I10 Essential (primary) hypertension: Secondary | ICD-10-CM | POA: Diagnosis not present

## 2018-07-05 DIAGNOSIS — N183 Chronic kidney disease, stage 3 unspecified: Secondary | ICD-10-CM | POA: Diagnosis present

## 2018-07-05 DIAGNOSIS — E1140 Type 2 diabetes mellitus with diabetic neuropathy, unspecified: Secondary | ICD-10-CM | POA: Diagnosis present

## 2018-07-05 DIAGNOSIS — L89616 Pressure-induced deep tissue damage of right heel: Secondary | ICD-10-CM | POA: Diagnosis present

## 2018-07-05 DIAGNOSIS — J9610 Chronic respiratory failure, unspecified whether with hypoxia or hypercapnia: Secondary | ICD-10-CM | POA: Diagnosis present

## 2018-07-05 DIAGNOSIS — G4733 Obstructive sleep apnea (adult) (pediatric): Secondary | ICD-10-CM | POA: Diagnosis present

## 2018-07-05 DIAGNOSIS — E1122 Type 2 diabetes mellitus with diabetic chronic kidney disease: Secondary | ICD-10-CM | POA: Diagnosis not present

## 2018-07-05 DIAGNOSIS — E785 Hyperlipidemia, unspecified: Secondary | ICD-10-CM | POA: Diagnosis present

## 2018-07-05 DIAGNOSIS — Z993 Dependence on wheelchair: Secondary | ICD-10-CM

## 2018-07-05 DIAGNOSIS — F329 Major depressive disorder, single episode, unspecified: Secondary | ICD-10-CM | POA: Diagnosis present

## 2018-07-05 DIAGNOSIS — N179 Acute kidney failure, unspecified: Principal | ICD-10-CM | POA: Diagnosis present

## 2018-07-05 DIAGNOSIS — N184 Chronic kidney disease, stage 4 (severe): Secondary | ICD-10-CM

## 2018-07-05 DIAGNOSIS — J439 Emphysema, unspecified: Secondary | ICD-10-CM | POA: Diagnosis not present

## 2018-07-05 DIAGNOSIS — K219 Gastro-esophageal reflux disease without esophagitis: Secondary | ICD-10-CM | POA: Diagnosis present

## 2018-07-05 DIAGNOSIS — Z9981 Dependence on supplemental oxygen: Secondary | ICD-10-CM

## 2018-07-05 DIAGNOSIS — Z1159 Encounter for screening for other viral diseases: Secondary | ICD-10-CM

## 2018-07-05 DIAGNOSIS — I130 Hypertensive heart and chronic kidney disease with heart failure and stage 1 through stage 4 chronic kidney disease, or unspecified chronic kidney disease: Secondary | ICD-10-CM | POA: Diagnosis present

## 2018-07-05 DIAGNOSIS — E860 Dehydration: Secondary | ICD-10-CM | POA: Diagnosis present

## 2018-07-05 DIAGNOSIS — T502X5A Adverse effect of carbonic-anhydrase inhibitors, benzothiadiazides and other diuretics, initial encounter: Secondary | ICD-10-CM | POA: Diagnosis present

## 2018-07-05 DIAGNOSIS — E7800 Pure hypercholesterolemia, unspecified: Secondary | ICD-10-CM | POA: Diagnosis present

## 2018-07-05 DIAGNOSIS — Z79899 Other long term (current) drug therapy: Secondary | ICD-10-CM

## 2018-07-05 DIAGNOSIS — E119 Type 2 diabetes mellitus without complications: Secondary | ICD-10-CM

## 2018-07-05 DIAGNOSIS — L89313 Pressure ulcer of right buttock, stage 3: Secondary | ICD-10-CM | POA: Diagnosis present

## 2018-07-05 DIAGNOSIS — Z803 Family history of malignant neoplasm of breast: Secondary | ICD-10-CM

## 2018-07-05 DIAGNOSIS — Z7951 Long term (current) use of inhaled steroids: Secondary | ICD-10-CM

## 2018-07-05 DIAGNOSIS — L89626 Pressure-induced deep tissue damage of left heel: Secondary | ICD-10-CM | POA: Diagnosis present

## 2018-07-05 DIAGNOSIS — Z794 Long term (current) use of insulin: Secondary | ICD-10-CM

## 2018-07-05 DIAGNOSIS — I5032 Chronic diastolic (congestive) heart failure: Secondary | ICD-10-CM | POA: Diagnosis present

## 2018-07-05 DIAGNOSIS — Z8711 Personal history of peptic ulcer disease: Secondary | ICD-10-CM

## 2018-07-05 DIAGNOSIS — Z86010 Personal history of colonic polyps: Secondary | ICD-10-CM

## 2018-07-05 DIAGNOSIS — I5042 Chronic combined systolic (congestive) and diastolic (congestive) heart failure: Secondary | ICD-10-CM

## 2018-07-05 DIAGNOSIS — E11621 Type 2 diabetes mellitus with foot ulcer: Secondary | ICD-10-CM | POA: Diagnosis present

## 2018-07-05 DIAGNOSIS — J449 Chronic obstructive pulmonary disease, unspecified: Secondary | ICD-10-CM | POA: Diagnosis present

## 2018-07-05 DIAGNOSIS — D631 Anemia in chronic kidney disease: Secondary | ICD-10-CM | POA: Diagnosis present

## 2018-07-05 DIAGNOSIS — Z96653 Presence of artificial knee joint, bilateral: Secondary | ICD-10-CM | POA: Diagnosis present

## 2018-07-05 DIAGNOSIS — R32 Unspecified urinary incontinence: Secondary | ICD-10-CM | POA: Diagnosis present

## 2018-07-05 DIAGNOSIS — N189 Chronic kidney disease, unspecified: Secondary | ICD-10-CM | POA: Diagnosis present

## 2018-07-05 DIAGNOSIS — I83009 Varicose veins of unspecified lower extremity with ulcer of unspecified site: Secondary | ICD-10-CM | POA: Diagnosis present

## 2018-07-05 DIAGNOSIS — R8290 Unspecified abnormal findings in urine: Secondary | ICD-10-CM | POA: Diagnosis not present

## 2018-07-05 DIAGNOSIS — L89159 Pressure ulcer of sacral region, unspecified stage: Secondary | ICD-10-CM | POA: Diagnosis present

## 2018-07-05 DIAGNOSIS — Z87891 Personal history of nicotine dependence: Secondary | ICD-10-CM

## 2018-07-05 LAB — COMPREHENSIVE METABOLIC PANEL
ALT: 17 U/L (ref 0–44)
AST: 18 U/L (ref 15–41)
Albumin: 2.5 g/dL — ABNORMAL LOW (ref 3.5–5.0)
Alkaline Phosphatase: 88 U/L (ref 38–126)
Anion gap: 15 (ref 5–15)
BUN: 131 mg/dL — ABNORMAL HIGH (ref 8–23)
CO2: 28 mmol/L (ref 22–32)
Calcium: 8.8 mg/dL — ABNORMAL LOW (ref 8.9–10.3)
Chloride: 93 mmol/L — ABNORMAL LOW (ref 98–111)
Creatinine, Ser: 3.3 mg/dL — ABNORMAL HIGH (ref 0.44–1.00)
GFR calc Af Amer: 15 mL/min — ABNORMAL LOW (ref >60)
GFR calc non Af Amer: 13 mL/min — ABNORMAL LOW (ref >60)
Glucose, Bld: 297 mg/dL — ABNORMAL HIGH (ref 70–99)
Potassium: 4.2 mmol/L (ref 3.5–5.1)
Sodium: 136 mmol/L (ref 135–145)
Total Bilirubin: 0.5 mg/dL (ref 0.3–1.2)
Total Protein: 7.1 g/dL (ref 6.5–8.1)

## 2018-07-05 LAB — GLUCOSE, CAPILLARY
Glucose-Capillary: 358 mg/dL — ABNORMAL HIGH (ref 70–99)
Glucose-Capillary: 408 mg/dL — ABNORMAL HIGH (ref 70–99)

## 2018-07-05 LAB — CBC WITH DIFFERENTIAL/PLATELET
Abs Immature Granulocytes: 0.07 10*3/uL (ref 0.00–0.07)
Basophils Absolute: 0 10*3/uL (ref 0.0–0.1)
Basophils Relative: 0 %
Eosinophils Absolute: 1.4 10*3/uL — ABNORMAL HIGH (ref 0.0–0.5)
Eosinophils Relative: 11 %
HCT: 27.6 % — ABNORMAL LOW (ref 36.0–46.0)
Hemoglobin: 8.9 g/dL — ABNORMAL LOW (ref 12.0–15.0)
Immature Granulocytes: 1 %
Lymphocytes Relative: 10 %
Lymphs Abs: 1.3 10*3/uL (ref 0.7–4.0)
MCH: 28.7 pg (ref 26.0–34.0)
MCHC: 32.2 g/dL (ref 30.0–36.0)
MCV: 89 fL (ref 80.0–100.0)
Monocytes Absolute: 0.8 10*3/uL (ref 0.1–1.0)
Monocytes Relative: 6 %
Neutro Abs: 8.8 10*3/uL — ABNORMAL HIGH (ref 1.7–7.7)
Neutrophils Relative %: 72 %
Platelets: 364 10*3/uL (ref 150–400)
RBC: 3.1 MIL/uL — ABNORMAL LOW (ref 3.87–5.11)
RDW: 14.9 % (ref 11.5–15.5)
WBC: 12.4 10*3/uL — ABNORMAL HIGH (ref 4.0–10.5)
nRBC: 0 % (ref 0.0–0.2)

## 2018-07-05 LAB — MAGNESIUM: Magnesium: 2.2 mg/dL (ref 1.7–2.4)

## 2018-07-05 LAB — PHOSPHORUS: Phosphorus: 3.6 mg/dL (ref 2.5–4.6)

## 2018-07-05 LAB — SARS CORONAVIRUS 2 BY RT PCR (HOSPITAL ORDER, PERFORMED IN ~~LOC~~ HOSPITAL LAB): SARS Coronavirus 2: NEGATIVE

## 2018-07-05 MED ORDER — ACETAMINOPHEN 650 MG RE SUPP: 650.0000 mg | Freq: Four times a day (QID) | RECTAL | Status: DC | PRN

## 2018-07-05 MED ORDER — AMLODIPINE BESYLATE 10 MG PO TABS: 10.0000 mg | ORAL_TABLET | Freq: Every day | ORAL | Status: DC

## 2018-07-05 MED ORDER — HEPARIN SODIUM (PORCINE) 5000 UNIT/ML IJ SOLN
5000.0000 [IU] | Freq: Three times a day (TID) | INTRAMUSCULAR | Status: DC
  Administered 2018-07-05 – 2018-07-10 (×15): 5000 [IU] via SUBCUTANEOUS
  Filled 2018-07-05 (×15): qty 1

## 2018-07-05 MED ORDER — INSULIN ASPART 100 UNIT/ML ~~LOC~~ SOLN
0.0000 [IU] | Freq: Three times a day (TID) | SUBCUTANEOUS | Status: DC
  Administered 2018-07-05: 9 [IU] via SUBCUTANEOUS
  Administered 2018-07-06: 7 [IU] via SUBCUTANEOUS
  Administered 2018-07-06: 5 [IU] via SUBCUTANEOUS
  Administered 2018-07-06: 7 [IU] via SUBCUTANEOUS
  Administered 2018-07-07: 2 [IU] via SUBCUTANEOUS
  Administered 2018-07-07: 3 [IU] via SUBCUTANEOUS
  Administered 2018-07-07 – 2018-07-08 (×2): 2 [IU] via SUBCUTANEOUS
  Administered 2018-07-08 – 2018-07-09 (×4): 1 [IU] via SUBCUTANEOUS
  Administered 2018-07-09: 2 [IU] via SUBCUTANEOUS
  Administered 2018-07-10: 3 [IU] via SUBCUTANEOUS

## 2018-07-05 MED ORDER — SODIUM CHLORIDE 0.45 % IV SOLN: INTRAVENOUS | Status: DC

## 2018-07-05 MED ORDER — AMLODIPINE BESYLATE 10 MG PO TABS
10.0000 mg | ORAL_TABLET | Freq: Every day | ORAL | Status: DC
  Administered 2018-07-06 – 2018-07-10 (×5): 10 mg via ORAL
  Filled 2018-07-05 (×5): qty 1

## 2018-07-05 MED ORDER — INSULIN ASPART 100 UNIT/ML ~~LOC~~ SOLN
8.0000 [IU] | Freq: Once | SUBCUTANEOUS | Status: AC
  Administered 2018-07-05: 8 [IU] via SUBCUTANEOUS

## 2018-07-05 MED ORDER — ACETAMINOPHEN 325 MG PO TABS
650.0000 mg | ORAL_TABLET | Freq: Four times a day (QID) | ORAL | Status: DC | PRN
  Administered 2018-07-06 – 2018-07-08 (×3): 650 mg via ORAL
  Filled 2018-07-05 (×4): qty 2

## 2018-07-05 MED ORDER — SODIUM CHLORIDE 0.9 % IV SOLN
1.0000 g | INTRAVENOUS | Status: AC
  Administered 2018-07-05 – 2018-07-07 (×3): 1 g via INTRAVENOUS
  Filled 2018-07-05 (×3): qty 10

## 2018-07-05 MED ORDER — IPRATROPIUM-ALBUTEROL 0.5-2.5 (3) MG/3ML IN SOLN: 3.0000 mL | Freq: Four times a day (QID) | RESPIRATORY_TRACT | Status: DC | PRN

## 2018-07-05 MED ORDER — SODIUM CHLORIDE 0.45 % IV SOLN
INTRAVENOUS | Status: DC
  Administered 2018-07-05: 13:00:00 via INTRAVENOUS

## 2018-07-05 NOTE — Progress Notes (Signed)
Received report from [employer]. In the ED. Pt en route to floor.

## 2018-07-05 NOTE — ED Triage Notes (Signed)
Pt here from [place] for abnormal lab value, facility thinks regarding creatinine or BUN but is unsure and paperwork does not indicate. Pt has ESRD but is not on dialysis. C/o bilateral foot pain. Wears 2L O2.

## 2018-07-05 NOTE — ED Notes (Signed)
Updated pts niece about plan of care for pt and admission.

## 2018-07-05 NOTE — Plan of Care (Signed)
  Problem: Clinical Measurements: Goal: Ability to maintain clinical measurements within normal limits will improve Outcome: Progressing   

## 2018-07-05 NOTE — Progress Notes (Signed)
Pt denies any form of abuse at current SNF facility. However, pt states: "there is a whole lot lacking". When asked to elaborate pt states "its' hard to explain". Did educate on CSW, CM, and spiritual staff if necessary. Pt informed of her rights to safe/quality care and encouraged to report any breaches of such here or at her current living facility.

## 2018-07-05 NOTE — ED Notes (Signed)
ED TO INPATIENT HANDOFF REPORT  ED Nurse Name and Phone #: Sherrine Maples [phone]  S Name/Age/Gender [name] 78 y.o. female Room/Bed: 026C/026C  Code Status   Code Status: Full Code  Home/SNF/Other Nursing Home Patient oriented to: self, place, time and situation Is this baseline? Yes   Triage Complete: Triage complete  Chief Complaint abn labs  Triage Note Pt here from [facility] for abnormal lab value, facility thinks regarding creatinine or BUN but is unsure and paperwork does not indicate. Pt has ESRD but is not on dialysis. C/o bilateral foot pain. Wears 2L O2.    Allergies Allergies  Allergen Reactions  . Other Other (See Comments)    "all generics make her sick"    Level of Care/Admitting Diagnosis ED Disposition    ED Disposition Condition [place]: [place] [[id number]]  Level of Care: Telemetry Medical [104]  I expect the patient will be discharged within 24 hours: No (not a candidate for 5C-Observation unit)  Covid Evaluation: Confirmed COVID Negative  Diagnosis: Acute on chronic renal failure [facility]) [[id number]]  Admitting Physician: Vashti Hey [[id number]]  Attending Physician: Vashti Hey [[id number]]  PT Class (Do Not Modify): Observation [104]  PT Acc Code (Do Not Modify): Observation [10022]       B Medical/Surgery History Past Medical History:  Diagnosis Date  . Adenomatous colon polyp   . Anemia   . Arthritis   . Asthma   . COPD (chronic obstructive pulmonary disease) ([place])   . 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 Depression   . Diabetes mellitus   . Diverticulosis   . Gastric ulcer   . GERD (gastroesophageal reflux disease)   . HTN (hypertension)   . Hypercholesterolemia   . Internal hemorrhoids   . Lumbar disc disease   . On home oxygen therapy    "2-3L @ bedtime" (04/04/2017)  . OSA (obstructive sleep apnea) 09/17/2012  . Peptic ulcer disease   . 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 Umbilical hernia   . Valgus foot    right    Past Surgical History:  Procedure Laterality Date  . COLONOSCOPY  2209 or 2010    2001, 2005, 2009  colonoscopy.  [place]  . COLONOSCOPY N/A 08/14/2012   Procedure: COLONOSCOPY;  Surgeon: Ladene Artist, MD;  Location: [facility] ENDOSCOPY;  Service: Endoscopy;  Laterality: N/A;  . INCISE AND DRAIN ABCESS     Abdominal Wall  . LIPOMA RESECTION     Right Neck  . TOTAL KNEE ARTHROPLASTY     Bilateral  . UMBILICAL HERNIA REPAIR  2007   incarcerated  . UPPER GASTROINTESTINAL ENDOSCOPY  2001   medoff Grade A esophagitis     A IV Location/Drains/Wounds Patient Lines/Drains/Airways Status   Active Line/Drains/Airways    Name:   Placement date:   Placement time:   Site:   Days:   Peripheral IV 07/05/18 Right Antecubital   07/05/18    1114    Antecubital   less than 1          Intake/Output Last 24 hours No intake or output data in the 24 hours ending 07/05/18 1431  Labs/Imaging Results for orders placed or performed during the hospital encounter of 07/05/18 (from the past 48 hour(s))  Comprehensive metabolic panel     Status: Abnormal   Collection Time: 07/05/18 11:22 AM  Result Value Ref Range   Sodium 136 135 -  145 mmol/L   Potassium 4.2 3.5 - 5.1 mmol/L   Chloride 93 (L) 98 - 111 mmol/L   CO2 28 22 - 32 mmol/L   Glucose, Bld 297 (H) 70 - 99 mg/dL   BUN 131 (H) 8 - 23 mg/dL   Creatinine, Ser 3.30 (H) 0.44 - 1.00 mg/dL   Calcium 8.8 (L) 8.9 - 10.3 mg/dL   Total Protein 7.1 6.5 - 8.1 g/dL   Albumin 2.5 (L) 3.5 - 5.0 g/dL   AST 18 15 - 41 U/L   ALT 17 0 - 44 U/L   Alkaline Phosphatase 88 38 - 126 U/L   Total Bilirubin 0.5 0.3 - 1.2 mg/dL   GFR calc non Af Amer 13 (L) >60 mL/min   GFR calc Af Amer 15 (L) >60 mL/min   Anion gap 15 5 - 15    Comment: Performed at [place] [address] [place], [place] [address]  CBC with Differential     Status: Abnormal   Collection Time: 07/05/18 11:22 AM   Result Value Ref Range   WBC 12.4 (H) 4.0 - 10.5 K/uL   RBC 3.10 (L) 3.87 - 5.11 MIL/uL   Hemoglobin 8.9 (L) 12.0 - 15.0 g/dL   HCT 27.6 (L) 36.0 - 46.0 %   MCV 89.0 80.0 - 100.0 fL   MCH 28.7 26.0 - 34.0 pg   MCHC 32.2 30.0 - 36.0 g/dL   RDW 14.9 11.5 - 15.5 %   Platelets 364 150 - 400 K/uL   nRBC 0.0 0.0 - 0.2 %   Neutrophils Relative % 72 %   Neutro Abs 8.8 (H) 1.7 - 7.7 K/uL   Lymphocytes Relative 10 %   Lymphs Abs 1.3 0.7 - 4.0 K/uL   Monocytes Relative 6 %   Monocytes Absolute 0.8 0.1 - 1.0 K/uL   Eosinophils Relative 11 %   Eosinophils Absolute 1.4 (H) 0.0 - 0.5 K/uL   Basophils Relative 0 %   Basophils Absolute 0.0 0.0 - 0.1 K/uL   Immature Granulocytes 1 %   Abs Immature Granulocytes 0.07 0.00 - 0.07 K/uL    Comment: Performed at [facility], [place] [address] [place], [place] [address]  Magnesium     Status: None   Collection Time: 07/05/18 11:22 AM  Result Value Ref Range   Magnesium 2.2 1.7 - 2.4 mg/dL    Comment: Performed at [place] [address], [place], [place] [address]  Phosphorus     Status: None   Collection Time: 07/05/18 11:22 AM  Result Value Ref Range   Phosphorus 3.6 2.5 - 4.6 mg/dL    Comment: Performed at [place] [address] [place], [place] [address]  SARS Coronavirus 2 (CEPHEID - Performed in [place] hospital lab), Hosp Order     Status: None   Collection Time: 07/05/18 11:37 AM   Specimen: Nasopharyngeal Swab  Result Value Ref Range   SARS Coronavirus 2 NEGATIVE NEGATIVE    Comment: (NOTE) If result is NEGATIVE SARS-CoV-2 target nucleic acids are NOT DETECTED. The SARS-CoV-2 RNA is generally detectable in upper and lower  respiratory specimens during the acute phase of infection. The lowest  concentration of SARS-CoV-2 viral copies this assay can detect is 250  copies / mL. A negative result does not preclude SARS-CoV-2 infection  and should not be used as the sole basis for treatment or other   patient management decisions.  A negative result may  occur with  improper specimen collection / handling, submission of specimen other  than nasopharyngeal swab, presence of viral mutation(s) within the  areas targeted by this assay, and inadequate number of viral copies  (<250 copies / mL). A negative result must be combined with clinical  observations, patient history, and epidemiological information. If result is POSITIVE SARS-CoV-2 target nucleic acids are DETECTED. The SARS-CoV-2 RNA is generally detectable in upper and lower  respiratory specimens dur ing the acute phase of infection.  Positive  results are indicative of active infection with SARS-CoV-2.  Clinical  correlation with patient history and other diagnostic information is  necessary to determine patient infection status.  Positive results do  not rule out bacterial infection or co-infection with other viruses. If result is PRESUMPTIVE POSTIVE SARS-CoV-2 nucleic acids MAY BE PRESENT.   A presumptive positive result was obtained on the submitted specimen  and confirmed on repeat testing.  While 2019 novel coronavirus  (SARS-CoV-2) nucleic acids may be present in the submitted sample  additional confirmatory testing may be necessary for epidemiological  and / or clinical management purposes  to differentiate between  SARS-CoV-2 and other Sarbecovirus currently known to infect humans.  If clinically indicated additional testing with an alternate test  methodology [phone]) is advised. The SARS-CoV-2 RNA is generally  detectable in upper and lower respiratory sp ecimens during the acute  phase of infection. The expected result is Negative. Fact Sheet for Patients:  StrictlyIdeas.no Fact Sheet for Healthcare Providers: BankingDealers.co.za This test is not yet approved or cleared by the [place] FDA and has been authorized for detection and/or diagnosis of SARS-CoV-2  by FDA under an Emergency Use Authorization (EUA).  This EUA will remain in effect (meaning this test can be used) for the duration of the COVID-19 declaration under Section 564(b)(1) of the Act, 21 U.S.C. section 360bbb-3(b)(1), unless the authorization is terminated or revoked sooner. Performed at [facility], [place] [address], [place], [place] [address]    Dg Chest Port 1 View  Result Date: 07/05/2018 CLINICAL DATA:  Renal failure.  Hypertension. EXAM: PORTABLE CHEST 1 VIEW COMPARISON:  June 16, 2018 FINDINGS: There is slight atelectasis in the left base. There is no edema or consolidation. Heart is borderline enlarged with pulmonary vascularity normal. No adenopathy. There is aortic atherosclerosis. There is degenerative change in each shoulder. IMPRESSION: No edema or consolidation. Slight left base atelectasis. Borderline cardiac enlargement. Aortic Atherosclerosis (ICD10-I70.0). Electronically Signed   By: Lowella Grip III M.D.   On: 07/05/2018 12:25    Pending Labs Unresulted Labs (From admission, onward)    Start     Ordered   07/06/18 7425  Basic metabolic panel  Tomorrow morning,   R     07/05/18 1418   07/06/18 0500  CBC  Tomorrow morning,   R     07/05/18 1418   07/05/18 1419  CBC  (heparin)  Once,   STAT    Comments: Baseline for heparin therapy IF NOT ALREADY DRAWN.  Notify MD if PLT < 100 K.    07/05/18 1418   07/05/18 1419  Creatinine, serum  (heparin)  Once,   STAT    Comments: Baseline for heparin therapy IF NOT ALREADY DRAWN.    07/05/18 1418   07/05/18 1122  Urinalysis, Routine w reflex microscopic  ONCE - STAT,   STAT     07/05/18 1122   07/05/18 1122  Urine culture  ONCE - STAT,   STAT  07/05/18 1122          Vitals/Pain Today's Vitals   07/05/18 1145 07/05/18 1200 07/05/18 1215 07/05/18 1315  BP: (!) 143/66 133/62 140/61 135/80  Pulse: 80 79 76 80  Resp: (!) 32 (!) 26 17 (!) 23  Temp:      TempSrc:      SpO2: 100% 99% 100% 100%   PainSc:        Isolation Precautions No active isolations  Medications Medications  0.45 % sodium chloride infusion ( Intravenous New Bag/Given 07/05/18 1315)  amLODipine (NORVASC) tablet 10 mg (has no administration in time range)  cefTRIAXone (ROCEPHIN) 1 g in sodium chloride 0.9 % 100 mL IVPB (has no administration in time range)  ipratropium-albuterol (DUONEB) 0.5-2.5 (3) MG/3ML nebulizer solution 3 mL (has no administration in time range)  heparin injection 5,000 Units (has no administration in time range)  acetaminophen (TYLENOL) tablet 650 mg (has no administration in time range)    Or  acetaminophen (TYLENOL) suppository 650 mg (has no administration in time range)  insulin aspart (novoLOG) injection 0-9 Units (has no administration in time range)    Mobility non-ambulatory High fall risk   Focused Assessments Renal Assessment Handoff:  Hemodialysis Schedule:  Last Hemodialysis date and time:    Restricted appendage:      R Recommendations: See Admitting Provider Note  Report given to:   Additional Notes:

## 2018-07-05 NOTE — ED Notes (Signed)
Attempted report x1. 

## 2018-07-05 NOTE — ED Provider Notes (Signed)
[facility] EMERGENCY DEPARTMENT Provider Note   CSN: [id number] Arrival date & time: 07/05/18  1103    History   Chief Complaint Chief Complaint  Patient presents with  . Abnormal Lab    HPI [name] is a 78 y.o. female.     The history is provided by the patient, the EMS personnel, medical records and the nursing home. No language interpreter was used.  Abnormal Lab  [name] is a 78 y.o. female who presents to the Emergency Department complaining of abnormal lab. Level V caveat due to confusion. History is provided by EMS and nursing home staff. Per EMS she was sent in for lab abnormality of BUN and creatinine. Patient states that she is experiencing bilateral leg pain that is been present for a long time, otherwise no acute complaints.  Per nursing home staff she had a lab draw performed yesterday that demonstrated a BUN of 128 and a creatinine of 3.93. She also had a large volume urinary incontinence just prior to ED transfer. Past Medical History:  Diagnosis Date  . Adenomatous colon polyp   . Anemia   . Arthritis   . Asthma   . COPD (chronic obstructive pulmonary disease) ([place])   . COPD with asthma ([place]) 04/20/2011  . Depression   . Diabetes mellitus   . Diverticulosis   . Gastric ulcer   . GERD (gastroesophageal reflux disease)   . HTN (hypertension)   . Hypercholesterolemia   . Internal hemorrhoids   . Lumbar disc disease   . On home oxygen therapy    "2-3L @ bedtime" (04/04/2017)  . OSA (obstructive sleep apnea) 09/17/2012  . Peptic ulcer disease   . 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 Umbilical hernia   . Valgus foot    right     Patient Active Problem List   Diagnosis Date Noted  . Acute on chronic renal failure ([place]) 07/05/2018  . Essential hypertension 07/05/2018  . CHF (congestive heart failure), NYHA class IV, chronic, combined ([place]) 07/05/2018  . Diabetic hyperosmolar non-ketotic state ([place]) 06/17/2018  .  Hyperglycemia 06/16/2018  . Type 2 diabetes mellitus ([place]) 06/16/2018  . UTI (urinary tract infection) 06/16/2018  . Sacral decubitus ulcer 06/16/2018  . Acute respiratory failure with hypoxia ([place]) 04/04/2017  . Hypertensive urgency 04/04/2017  . CKD (chronic kidney disease) stage 4, GFR 15-29 ml/min ([facility]) 04/04/2017  . Hypoglycemia   . Acute renal failure superimposed on stage 3 chronic kidney disease ([place]) 11/23/2016  . Metabolic acidosis with normal anion gap and bicarbonate losses 11/23/2016  . Acute exacerbation of CHF (congestive heart failure) ([place]) 11/22/2016  . Acute on chronic respiratory failure with hypoxia ([place]) 11/22/2016  . Midfoot ulcer, left, limited to breakdown of skin ([place]) 06/13/2016  . Idiopathic chronic venous hypertension of both lower extremities with inflammation 03/10/2016  . Onychomycosis 03/10/2016  . Chronic obstructive pulmonary disease ([place]) 11/07/2013  . Upper airway cough syndrome 11/07/2013  . Insomnia 12/10/2012  . OSA (obstructive sleep apnea) 09/17/2012  . Benign neoplasm of colon 08/14/2012  . Rectal bleeding 08/13/2012    Class: Acute  . Anemia 08/13/2012    Class: Acute  . Osteoarthritis 08/13/2012    Class: Chronic  . Acute posthemorrhagic anemia 08/13/2012  . Diverticulosis of colon (without mention of hemorrhage) 08/13/2012  . Hoarseness 07/20/2011  . COPD with asthma ([place]) 04/20/2011  . Allergic rhinitis 04/20/2011  . GI bleed 02/08/2011  . Personal history of  colonic polyps 04/16/2010  . Fecal incontinence 03/23/2010  . Change in bowel habits 03/23/2010    Past Surgical History:  Procedure Laterality Date  . COLONOSCOPY  2209 or 2010    2001, 2005, 2009  colonoscopy.  [place]  . COLONOSCOPY N/A 08/14/2012   Procedure: COLONOSCOPY;  Surgeon: Ladene Artist, MD;  Location: [facility] ENDOSCOPY;  Service: Endoscopy;  Laterality: N/A;  . INCISE AND DRAIN ABCESS     Abdominal Wall  . LIPOMA RESECTION     Right Neck  . TOTAL KNEE  ARTHROPLASTY     Bilateral  . UMBILICAL HERNIA REPAIR  2007   incarcerated  . UPPER GASTROINTESTINAL ENDOSCOPY  2001   medoff Grade A esophagitis     OB History   No obstetric history on file.      Home Medications    Prior to Admission medications   Medication Sig Start Date End Date Taking? Authorizing Provider  Amino Acids-Protein Hydrolys (FEEDING SUPPLEMENT, PRO-STAT SUGAR FREE 64,) LIQD Take 30 mLs by mouth 2 (two) times daily. 06/20/18  Yes [place] Eric J, DO  amLODipine (NORVASC) 5 MG tablet Take 10 mg by mouth daily.    Yes [provider]  calcitRIOL (ROCALTROL) 0.25 MCG capsule Take 1 capsule (0.25 mcg total) by mouth every Monday, Wednesday, and Friday. 12/05/16  Yes Velvet Bathe, MD  Calcium Carbonate-Vitamin D (CALTRATE 600+D) 600-400 MG-UNIT per tablet Take 1 tablet by mouth daily.   Yes [provider]  cyclobenzaprine (FLEXERIL) 10 MG tablet Take 10 mg 3 (three) times daily as needed by mouth for muscle spasms.   Yes [provider]  folic acid (FOLVITE) 676 MCG tablet Take 400 mcg by mouth daily.   Yes [provider]  insulin aspart (NOVOLOG) 100 UNIT/ML injection Inject 6 Units into the skin 3 (three) times daily with meals. Patient taking differently: Inject 3-15 Units into the skin 3 (three) times daily with meals. Sliding scale 0-150 =0 units, 151-200 units-= 3 units, 201-250= 6 units, 251-300=9, 301-350= 12 units, 351-400 =15 units. 06/20/18  Yes [place] Eric J, DO  insulin glargine (LANTUS) 100 UNIT/ML injection Inject 0.3 mLs (30 Units total) into the skin at bedtime. 06/20/18  Yes [place] Eric J, DO  ipratropium-albuterol (DUONEB) 0.5-2.5 (3) MG/3ML SOLN Take 3 mLs by nebulization every 6 (six) hours as needed. Patient taking differently: Take 3 mLs every 6 (six) hours as needed by nebulization (SOB).  10/05/16  Yes Chesley Mires, MD  iron polysaccharides (NIFEREX) 150 MG capsule Take 150 mg by mouth 2 (two) times daily.   Yes  [provider]  LORazepam (ATIVAN) 1 MG tablet Take 0.5 tablets (0.5 mg total) by mouth 2 (two) times daily as needed for anxiety or sleep. Take 0.5 tablet every morning and 1 tablet in the evening as needed for sleep Patient taking differently: Take 0.5 mg by mouth 2 (two) times daily as needed for anxiety or sleep.  06/20/18  Yes [place] Eric J, DO  magnesium oxide (MAG-OX) 400 MG tablet Take 400 mg by mouth daily.   Yes [provider]  montelukast (SINGULAIR) 10 MG tablet Take 1 tablet (10 mg total) by mouth at bedtime. 10/05/16  Yes Chesley Mires, MD  Omega-3 Fatty Acids (FISH OIL) 1000 MG CAPS Take 1,000 mg by mouth daily.    Yes [provider]  prochlorperazine (COMPAZINE) 5 MG tablet Take 5 mg by mouth every 6 (six) hours as needed for nausea.   Yes [provider]  Propylene Glycol (SYSTANE BALANCE) 0.6 % SOLN Apply 1 drop to eye 2 (two) times daily as needed (dry eyes).    Yes [provider]  pyridOXINE (VITAMIN B-6) 100 MG tablet Take 100 mg by mouth daily.   Yes [provider]  torsemide (DEMADEX) 10 MG tablet Take 5 tablets (50 mg total) by mouth 2 (two) times daily. 12/02/16  Yes Velvet Bathe, MD  traZODone (DESYREL) 50 MG tablet Take 50 mg at bedtime as needed by mouth for sleep.   Yes [provider]  vitamin B-12 (CYANOCOBALAMIN) 1000 MCG tablet Take 1,000 mcg by mouth daily.   Yes [provider]  Vitamin D, Ergocalciferol, (DRISDOL) 50000 UNITS CAPS capsule Take 50,000 Units by mouth 2 (two) times a week. 04/07/14  Yes [provider]  vitamin E 400 UNIT capsule Take 400 Units by mouth daily.   Yes [provider]  ZOCOR 20 MG tablet Take 20 mg by mouth daily. 03/16/14  Yes [provider]    Family History Family History  Problem Relation Age of Onset  . Breast cancer Maternal Aunt     Social History Social History   Tobacco Use  . Smoking status: Former Smoker     Packs/day: 2.00    Years: 5.00    Pack years: 10.00    Types: Cigarettes    Quit date: 01/10/1958    Years since quitting: 60.5  . Smokeless tobacco: Never Used  Substance Use Topics  . Alcohol use: No    Comment: Quit when she quit smoking  . Drug use: No     Allergies   Other   Review of Systems Review of Systems  All other systems reviewed and are negative.    Physical Exam Updated Vital Signs BP (!) 150/66 (BP Location: Left Arm)   Pulse 81   Temp 98.7 F (37.1 C) (Oral)   Resp 20   SpO2 100%   Physical Exam Vitals signs and nursing note reviewed.  Constitutional:      Appearance: She is well-developed.  HENT:     Head: Normocephalic and atraumatic.  Cardiovascular:     Rate and Rhythm: Normal rate and regular rhythm.     Heart sounds: No murmur.  Pulmonary:     Effort: Pulmonary effort is normal. No respiratory distress.     Breath sounds: Normal breath sounds.  Abdominal:     Palpations: Abdomen is soft.     Tenderness: There is no abdominal tenderness. There is no guarding or rebound.  Musculoskeletal:     Comments: Significant tenderness to palpation throughout bilateral feet and distal lower extremities. There is lichenification of distal bilateral lower extremities and chronic  venous stasus changes.  There is ulceration of the right heal with early necrotic changes.  Unable to palpate DP pulses due to poor patient tolerance (pain on palpation).    Skin:    General: Skin is warm and dry.  Neurological:     Mental Status: She is alert and oriented to person, place, and time.     Comments: Mildly confused, BLE weakness.    Psychiatric:        Behavior: Behavior normal.      ED Treatments / Results  Labs (all labs ordered are listed, but only abnormal results are displayed) Labs Reviewed  COMPREHENSIVE METABOLIC PANEL - Abnormal; Notable for the following components:      Result Value   Chloride 93 (*)    Glucose, Bld 297 (*)  BUN 131 (*)     Creatinine, Ser 3.30 (*)    Calcium 8.8 (*)    Albumin 2.5 (*)    GFR calc non Af Amer 13 (*)    GFR calc Af Amer 15 (*)    All other components within normal limits  CBC WITH DIFFERENTIAL/PLATELET - Abnormal; Notable for the following components:   WBC 12.4 (*)    RBC 3.10 (*)    Hemoglobin 8.9 (*)    HCT 27.6 (*)    Neutro Abs 8.8 (*)    Eosinophils Absolute 1.4 (*)    All other components within normal limits  SARS CORONAVIRUS 2 (HOSPITAL ORDER, [facility])  URINE CULTURE  MAGNESIUM  PHOSPHORUS  URINALYSIS, ROUTINE W REFLEX MICROSCOPIC    EKG EKG Interpretation  Date/Time:  Thursday July 05 2018 11:13:52 EDT Ventricular Rate:  90 PR Interval:    QRS Duration: 97 QT Interval:  367 QTC Calculation: 449 R Axis:   2 Text Interpretation:  Sinus rhythm Left ventricular hypertrophy Confirmed by Quintella Reichert [id number]) on 07/05/2018 11:29:20 AM   Radiology Dg Chest Port 1 View  Result Date: 07/05/2018 CLINICAL DATA:  Renal failure.  Hypertension. EXAM: PORTABLE CHEST 1 VIEW COMPARISON:  June 16, 2018 FINDINGS: There is slight atelectasis in the left base. There is no edema or consolidation. Heart is borderline enlarged with pulmonary vascularity normal. No adenopathy. There is aortic atherosclerosis. There is degenerative change in each shoulder. IMPRESSION: No edema or consolidation. Slight left base atelectasis. Borderline cardiac enlargement. Aortic Atherosclerosis (ICD10-I70.0). Electronically Signed   By: Lowella Grip III M.D.   On: 07/05/2018 12:25    Procedures Procedures (including critical care time)  Medications Ordered in ED Medications  0.45 % sodium chloride infusion ( Intravenous New Bag/Given 07/05/18 1315)  cefTRIAXone (ROCEPHIN) 1 g in sodium chloride 0.9 % 100 mL IVPB (has no administration in time range)  ipratropium-albuterol (DUONEB) 0.5-2.5 (3) MG/3ML nebulizer solution 3 mL (has no administration in time range)  heparin  injection 5,000 Units (has no administration in time range)  acetaminophen (TYLENOL) tablet 650 mg (has no administration in time range)    Or  acetaminophen (TYLENOL) suppository 650 mg (has no administration in time range)  insulin aspart (novoLOG) injection 0-9 Units (has no administration in time range)  amLODipine (NORVASC) tablet 10 mg (has no administration in time range)     Initial Impression / Assessment and Plan / ED Course  I have reviewed the triage vital signs and the nursing notes.  Pertinent labs & imaging results that were available during my care of the patient were reviewed by me and considered in my medical decision making (see chart for details).       patient with history of CKD here for evaluation of acute on chronic renal failure. She is dehydrated appearing on evaluation but in no acute acute distress. She is mildly confused. She does have pain to bilateral feet with no evidence of active infection. Labs are significant for acute on chronic renal failure with significant uremia. She was treated with gentle IV fluid hydration. Hospitalist consulted for admission for further management. Final Clinical Impressions(s) / ED Diagnoses   Final diagnoses:  Acute renal failure superimposed on stage 4 chronic kidney disease, unspecified acute renal failure type [facility])    ED Discharge Orders    None       Quintella Reichert, MD 07/05/18 1541

## 2018-07-05 NOTE — H&P (Signed)
History and Physical:    [name] [facility]   DTO:[id number] DOB: 1940/10/22 DOA: 07/05/2018  Referring MD/provider: Dr Ralene Bathe PCP: Burnard Bunting, MD   Patient coming from: Home  Chief Complaint: worsened renal function on routine labs done at [facility].  History of Present Illness:   [name] is an 78 y.o. female with PMH significant fortype 2 diabetes, chronic diastolic congestive heart failure,CKD 4,hypertension, hyperlipidemia, COPD on home oxygen, GERD, OSA who is sent in from [place] due to elevated of creatinine seen on routine labs. She had recently been in house for management of CHF and discharged 10/30/2018 on torsemide 50 mg p.o. twice daily.  Patient herself states that she feels fine.  She is not sure why she is here but knows that "they keep telling me my kidney is bad".  Patient admits to feeling weak and tired but states that is not new.  Patient denies any shortness of breath per se but notes that she is a little short of breath all the time but this is no different from usual.  Patient denies fevers or chills.  Denies abdominal pain nausea vomiting or diarrhea.  Notes that she has been eating okay.  Per nursing home staff she had a lab draw performed yesterday that demonstrated a BUN of 128 and a creatinine of 3.93. Baseline creatine is around 2.5, with creatinine at discharge 2 weeks ago 2.35.  Patient apparently has not had any diminishment in urination but is noted to be incontinent at baseline.  Cardiogram done last admission shows normal EF with some diastolic dysfunction and LV.  ED Course:  The patient was noted to have bun/creatinine 131/3.3 today. She was noted to be significantly dehydrated, and was treated with 1/2 NS 50cc/hr x 500 cc.   ROS:   ROS   Review of Systems: Per HPI  Past Medical History:   Past Medical History:  Diagnosis Date  . Adenomatous colon polyp   . Anemia   . Arthritis   . Asthma   . COPD (chronic  obstructive pulmonary disease) ([place])   . COPD with asthma ([place]) 04/20/2011  . Depression   . Diabetes mellitus   . Diverticulosis   . Gastric ulcer   . GERD (gastroesophageal reflux disease)   . HTN (hypertension)   . Hypercholesterolemia   . Internal hemorrhoids   . Lumbar disc disease   . On home oxygen therapy    "2-3L @ bedtime" (04/04/2017)  . OSA (obstructive sleep apnea) 09/17/2012  . Peptic ulcer disease   . 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 Umbilical hernia   . Valgus foot    right     Past Surgical History:   Past Surgical History:  Procedure Laterality Date  . COLONOSCOPY  2209 or 2010    2001, 2005, 2009  colonoscopy.  [place]  . COLONOSCOPY N/A 08/14/2012   Procedure: COLONOSCOPY;  Surgeon: Ladene Artist, MD;  Location[facility] ENDOSCOPY;  Service: Endoscopy;  Laterality: N/A;  . INCISE AND DRAIN ABCESS     Abdominal Wall  . LIPOMA RESECTION     Right Neck  . TOTAL KNEE ARTHROPLASTY     Bilateral  . UMBILICAL HERNIA REPAIR  2007   incarcerated  . UPPER GASTROINTESTINAL ENDOSCOPY  2001   medoff Grade A esophagitis    Social History:   Social History   Socioeconomic History  . Marital status: Single    Spouse name: Not on file  .  Number of children: 0  . Years of education: Not on file  . Highest education level: Not on file  Occupational History  . Occupation: Retired    Comment: Administrator  . Financial resource strain: Not on file  . Food insecurity    Worry: Not on file    Inability: Not on file  . Transportation needs    Medical: Not on file    Non-medical: Not on file  Tobacco Use  . Smoking status: Former Smoker    Packs/day: 2.00    Years: 5.00    Pack years: 10.00    Types: Cigarettes    Quit date: 01/10/1958    Years since quitting: 60.5  . Smokeless tobacco: Never Used  Substance and Sexual Activity  . Alcohol use: No    Comment: Quit when she quit smoking  . Drug use: No  . Sexual activity: Never   Lifestyle  . Physical activity    Days per week: Not on file    Minutes per session: Not on file  . Stress: Not on file  Relationships  . Social Herbalist on phone: Not on file    Gets together: Not on file    Attends religious service: Not on file    Active member of club or organization: Not on file    Attends meetings of clubs or organizations: Not on file    Relationship status: Not on file  . Intimate partner violence    Fear of current or ex partner: Not on file    Emotionally abused: Not on file    Physically abused: Not on file    Forced sexual activity: Not on file  Other Topics Concern  . Not on file  Social History Narrative   Retired from [employer] no children   Sister in [place]   Other  Family history:   Family History  Problem Relation Age of Onset  . Breast cancer Maternal Aunt     Current Medications:   Prior to Admission medications   Medication Sig Start Date End Date Taking? Authorizing Provider  Amino Acids-Protein Hydrolys (FEEDING SUPPLEMENT, PRO-STAT SUGAR FREE 64,) LIQD Take 30 mLs by mouth 2 (two) times daily. 06/20/18  Yes [place] Eric J, DO  amLODipine (NORVASC) 5 MG tablet Take 10 mg by mouth daily.     [provider]  calcitRIOL (ROCALTROL) 0.25 MCG capsule Take 1 capsule (0.25 mcg total) by mouth every Monday, Wednesday, and Friday. 12/05/16   Velvet Bathe, MD  Calcium Carbonate-Vitamin D (CALTRATE 600+D) 600-400 MG-UNIT per tablet Take 1 tablet by mouth daily.    [provider]  cyclobenzaprine (FLEXERIL) 10 MG tablet Take 10 mg 3 (three) times daily as needed by mouth for muscle spasms.    [provider]  folic acid (FOLVITE) 267 MCG tablet Take 400 mcg by mouth daily.    [provider]  insulin aspart (NOVOLOG) 100 UNIT/ML injection Inject 6 Units into the skin 3 (three) times daily with meals. 06/20/18   [place] Eric J, DO  insulin glargine (LANTUS) 100  UNIT/ML injection Inject 0.3 mLs (30 Units total) into the skin at bedtime. 06/20/18   [place] Eric J, DO  ipratropium-albuterol (DUONEB) 0.5-2.5 (3) MG/3ML SOLN Take 3 mLs by nebulization every 6 (six) hours as needed. Patient taking differently: Take 3 mLs every 6 (six) hours as needed by nebulization (SOB).  10/05/16   Chesley Mires,  MD  iron polysaccharides (NIFEREX) 150 MG capsule Take 150 mg by mouth 2 (two) times daily.    [provider]  LORazepam (ATIVAN) 1 MG tablet Take 0.5 tablets (0.5 mg total) by mouth 2 (two) times daily as needed for anxiety or sleep. Take 0.5 tablet every morning and 1 tablet in the evening as needed for sleep 06/20/18   [place] Donnamarie Poag, DO  magnesium oxide (MAG-OX) 400 MG tablet Take 400 mg by mouth daily.    [provider]  montelukast (SINGULAIR) 10 MG tablet Take 1 tablet (10 mg total) by mouth at bedtime. 10/05/16   Chesley Mires, MD  Omega-3 Fatty Acids (FISH OIL) 1000 MG CAPS Take 1,000 mg by mouth daily.     [provider]  prochlorperazine (COMPAZINE) 5 MG tablet Take 5 mg by mouth every 6 (six) hours as needed for nausea.    [provider]  Propylene Glycol (SYSTANE BALANCE) 0.6 % SOLN Apply 1 drop to eye 2 (two) times daily as needed (dry eyes).     [provider]  pyridOXINE (VITAMIN B-6) 100 MG tablet Take 100 mg by mouth daily.    [provider]  torsemide (DEMADEX) 10 MG tablet Take 5 tablets (50 mg total) by mouth 2 (two) times daily. 12/02/16   Velvet Bathe, MD  traZODone (DESYREL) 50 MG tablet Take 50 mg at bedtime as needed by mouth for sleep.    [provider]  vitamin B-12 (CYANOCOBALAMIN) 1000 MCG tablet Take 1,000 mcg by mouth daily.    [provider]  Vitamin D, Ergocalciferol, (DRISDOL) 50000 UNITS CAPS capsule Take 50,000 Units by mouth 2 (two) times a week. 04/07/14   [provider]  vitamin E 400 UNIT capsule Take 400 Units by mouth daily.    [provider]  ZOCOR 20 MG tablet Take 20 mg by mouth daily. 03/16/14   [provider]    Physical Exam:   Vitals:   07/05/18 1145 07/05/18 1200 07/05/18 1215 07/05/18 1315  BP: (!) 143/66 133/62 140/61 135/80  Pulse: 80 79 76 80  Resp: (!) 32 (!) 26 17 (!) 23  Temp:      TempSrc:      SpO2: 100% 99% 100% 100%     Physical Exam: Blood pressure 135/80, pulse 80, temperature 98.6 F (37 C), temperature source Oral, resp. rate (!) 23, SpO2 100 %. Gen: Tired appearing female looking older than stated age who is moving and speaking somewhat slowly.  Her thought processes seem to be at somewhat slow as well. Eyes: Sclerae anicteric. Conjunctiva mildly injected. Chest: Moderately good air entry bilaterally with no adventitious sounds.  CV: Distant, regular, no audible murmurs. Abdomen: NABS, soft, nondistended, nontender. No tenderness to light or deep palpation. No rebound, no guarding. Extremities: Patient has chronic hyperpigmentation likely secondary to brawny edema however right now her skin is markedly shriveled with minimal edema.  Patient notes she is very ticklish with touching of her lower extremities and indeed she started giggling when I was examining her.  Neuro: Alert and oriented times 3; grossly nonfocal does seem to have slowness of thought and speech.   Data Review:    Labs: Basic Metabolic Panel: Recent Labs  Lab 07/05/18 1122  NA 136  K 4.2  CL 93*  CO2 28  GLUCOSE 297*  BUN 131*  CREATININE 3.30*  CALCIUM 8.8*  MG 2.2  PHOS 3.6   Liver Function Tests: Recent Labs  Lab  07/05/18 1122  AST 18  ALT 17  ALKPHOS 88  BILITOT 0.5  PROT 7.1  ALBUMIN 2.5*   No results for input(s): LIPASE, AMYLASE in the last 168 hours. No results for input(s): AMMONIA in the last 168 hours. CBC: Recent Labs  Lab 07/05/18 1122  WBC 12.4*  NEUTROABS 8.8*  HGB 8.9*  HCT 27.6*  MCV 89.0  PLT 364   Cardiac Enzymes: No results for input(s): CKTOTAL,  CKMB, CKMBINDEX, TROPONINI in the last 168 hours.  BNP (last 3 results) No results for input(s): PROBNP in the last 8760 hours. CBG: No results for input(s): GLUCAP in the last 168 hours.  Urinalysis    Component Value Date/Time   COLORURINE YELLOW 06/16/2018 2016   APPEARANCEUR HAZY (A) 06/16/2018 2016   LABSPEC 1.011 06/16/2018 2016   PHURINE 5.0 06/16/2018 2016   GLUCOSEU >=500 (A) 06/16/2018 2016   HGBUR MODERATE (A) 06/16/2018 2016   BILIRUBINUR NEGATIVE 06/16/2018 2016   KETONESUR NEGATIVE 06/16/2018 2016   PROTEINUR NEGATIVE 06/16/2018 2016   UROBILINOGEN 0.2 12/16/2011 1717   NITRITE NEGATIVE 06/16/2018 2016   LEUKOCYTESUR MODERATE (A) 06/16/2018 2016      Radiographic Studies: Dg Chest Port 1 View  Result Date: 07/05/2018 CLINICAL DATA:  Renal failure.  Hypertension. EXAM: PORTABLE CHEST 1 VIEW COMPARISON:  June 16, 2018 FINDINGS: There is slight atelectasis in the left base. There is no edema or consolidation. Heart is borderline enlarged with pulmonary vascularity normal. No adenopathy. There is aortic atherosclerosis. There is degenerative change in each shoulder. IMPRESSION: No edema or consolidation. Slight left base atelectasis. Borderline cardiac enlargement. Aortic Atherosclerosis (ICD10-I70.0). Electronically Signed   By: Lowella Grip III M.D.   On: 07/05/2018 12:25    EKG: Independently reviewed.  Sinus rhythm at 90.  First degree AV block.  Normal axis.  No acute ST-T wave changes.  Maybe some poor R wave progression.   Assessment/Plan:   Principal Problem:   Acute renal failure superimposed on stage 3 chronic kidney disease (HCC) Active Problems:   Chronic obstructive pulmonary disease (HCC)   Type 2 diabetes mellitus (HCC)   Acute on chronic renal failure (HCC)   Essential hypertension   CHF (congestive heart failure), NYHA class IV, chronic, combined [facility])   89-year-old female returns from nursing home where she was recently admitted after  diuresis for acute congestive heart failure.  Patient at present seems to have some slight worsening of her renal function from 2.5-3.3.  She does appear to be somewhat dehydrated and slow on examination, this is apparently somewhat from her baseline, he is usually apparently more energetic.  She also has an abnormal UA with new leukoesterase positive although nitrite negative.   ACUTE ON  CHRONIC RENAL FAILURE Likely secondary to double overdiuresis as well as possible urinary tract infection. She received 500 cc normal saline in the ED We will not continue any further hydration but will hold her diuretics while in house Treat resumptive UTI with ceftriaxone pending culture.  ABNORMAL UA Patient is nitrite negative but leukoesterase positive while she was negative at last check. Therefore will start empiric antibiotics of ceftriaxone x3 doses as infection may be contributing to her renal dysfunction  HFpEF Patient seems to be adequately compensated at present in fact is likely intravascularly volume depleted. Need to follow lung exam closely as she is getting 500 cc half-normal saline in the ED. I will give her another 500 cc at 50 cc an hour for total of 1 L  half-normal saline total. I be holding her diuretics while in house.    COPD No evidence for acute flare. Continue PRN inhaled bronchodilators and montelukast  HTN Reasonably controlled, continue amlodipine 10 mg daily per home doses.   I am holding patient's torsemide and given her renal dysfunction her blood pressure may increase so we will need to follow that.    DM Takes Lantus 30 units at bedtime however will decrease to 20 units and cover with sliding scale insulin while in house to adjust to changes in diet while in house. Very ticklish on her legs which is very likely a manifestation of her diabetic neuropathy.  OSA Patient has declined use of CPAP in the past.  SACRAL AND VENOUS STASIS ULCERS We will ask wound nurse  to evaluate patient.     Other information:   DVT prophylaxis: Heparin ordered. Code Status: Full code. Family Communication: Patient states that her godmother is the only person she would want me to call however her godmother herself is too sick right now to be called.   Disposition Plan: Probably return to [facility] called: None Admission status: Observation  The medical decision making on this patient was of high complexity and the patient is at high risk for clinical deterioration, therefore this is a level 3 visit.   Dewaine Oats Tublu Hadley Detloff [facility]  If 7PM-7AM, please contact night-coverage [email or web address] Password [facility] 07/05/2018, 2:03 PM

## 2018-07-05 NOTE — ED Notes (Signed)
Pt given ice chips and water

## 2018-07-06 DIAGNOSIS — E7800 Pure hypercholesterolemia, unspecified: Secondary | ICD-10-CM | POA: Diagnosis present

## 2018-07-06 DIAGNOSIS — E860 Dehydration: Secondary | ICD-10-CM | POA: Diagnosis present

## 2018-07-06 DIAGNOSIS — N179 Acute kidney failure, unspecified: Secondary | ICD-10-CM | POA: Diagnosis present

## 2018-07-06 DIAGNOSIS — E1122 Type 2 diabetes mellitus with diabetic chronic kidney disease: Secondary | ICD-10-CM | POA: Diagnosis present

## 2018-07-06 DIAGNOSIS — I130 Hypertensive heart and chronic kidney disease with heart failure and stage 1 through stage 4 chronic kidney disease, or unspecified chronic kidney disease: Secondary | ICD-10-CM | POA: Diagnosis present

## 2018-07-06 DIAGNOSIS — D631 Anemia in chronic kidney disease: Secondary | ICD-10-CM | POA: Diagnosis present

## 2018-07-06 DIAGNOSIS — G4733 Obstructive sleep apnea (adult) (pediatric): Secondary | ICD-10-CM | POA: Diagnosis present

## 2018-07-06 DIAGNOSIS — J439 Emphysema, unspecified: Secondary | ICD-10-CM | POA: Diagnosis not present

## 2018-07-06 DIAGNOSIS — R8290 Unspecified abnormal findings in urine: Secondary | ICD-10-CM | POA: Diagnosis present

## 2018-07-06 DIAGNOSIS — I5042 Chronic combined systolic (congestive) and diastolic (congestive) heart failure: Secondary | ICD-10-CM

## 2018-07-06 DIAGNOSIS — J449 Chronic obstructive pulmonary disease, unspecified: Secondary | ICD-10-CM | POA: Diagnosis present

## 2018-07-06 DIAGNOSIS — L89159 Pressure ulcer of sacral region, unspecified stage: Secondary | ICD-10-CM | POA: Diagnosis present

## 2018-07-06 DIAGNOSIS — I83009 Varicose veins of unspecified lower extremity with ulcer of unspecified site: Secondary | ICD-10-CM | POA: Diagnosis present

## 2018-07-06 DIAGNOSIS — T502X5A Adverse effect of carbonic-anhydrase inhibitors, benzothiadiazides and other diuretics, initial encounter: Secondary | ICD-10-CM | POA: Diagnosis present

## 2018-07-06 DIAGNOSIS — R32 Unspecified urinary incontinence: Secondary | ICD-10-CM | POA: Diagnosis present

## 2018-07-06 DIAGNOSIS — E785 Hyperlipidemia, unspecified: Secondary | ICD-10-CM | POA: Diagnosis present

## 2018-07-06 DIAGNOSIS — Z1159 Encounter for screening for other viral diseases: Secondary | ICD-10-CM | POA: Diagnosis not present

## 2018-07-06 DIAGNOSIS — N184 Chronic kidney disease, stage 4 (severe): Secondary | ICD-10-CM | POA: Diagnosis present

## 2018-07-06 DIAGNOSIS — E11621 Type 2 diabetes mellitus with foot ulcer: Secondary | ICD-10-CM | POA: Diagnosis present

## 2018-07-06 DIAGNOSIS — L89616 Pressure-induced deep tissue damage of right heel: Secondary | ICD-10-CM | POA: Diagnosis present

## 2018-07-06 DIAGNOSIS — J9610 Chronic respiratory failure, unspecified whether with hypoxia or hypercapnia: Secondary | ICD-10-CM | POA: Diagnosis present

## 2018-07-06 DIAGNOSIS — N183 Chronic kidney disease, stage 3 (moderate): Secondary | ICD-10-CM | POA: Diagnosis not present

## 2018-07-06 DIAGNOSIS — L89626 Pressure-induced deep tissue damage of left heel: Secondary | ICD-10-CM | POA: Diagnosis present

## 2018-07-06 DIAGNOSIS — F329 Major depressive disorder, single episode, unspecified: Secondary | ICD-10-CM | POA: Diagnosis present

## 2018-07-06 DIAGNOSIS — E1140 Type 2 diabetes mellitus with diabetic neuropathy, unspecified: Secondary | ICD-10-CM | POA: Diagnosis present

## 2018-07-06 DIAGNOSIS — L89313 Pressure ulcer of right buttock, stage 3: Secondary | ICD-10-CM | POA: Diagnosis present

## 2018-07-06 DIAGNOSIS — I5032 Chronic diastolic (congestive) heart failure: Secondary | ICD-10-CM | POA: Diagnosis present

## 2018-07-06 DIAGNOSIS — K219 Gastro-esophageal reflux disease without esophagitis: Secondary | ICD-10-CM | POA: Diagnosis present

## 2018-07-06 LAB — URINALYSIS, ROUTINE W REFLEX MICROSCOPIC
Bilirubin Urine: NEGATIVE
Glucose, UA: 150 mg/dL — AB
Hgb urine dipstick: NEGATIVE
Ketones, ur: NEGATIVE mg/dL
Nitrite: NEGATIVE
Protein, ur: NEGATIVE mg/dL
Specific Gravity, Urine: 1.013 (ref 1.005–1.030)
WBC, UA: >50 WBC/hpf — ABNORMAL HIGH (ref 0–5)
pH: 5 (ref 5.0–8.0)

## 2018-07-06 LAB — GLUCOSE, CAPILLARY
Glucose-Capillary: 285 mg/dL — ABNORMAL HIGH (ref 70–99)
Glucose-Capillary: 317 mg/dL — ABNORMAL HIGH (ref 70–99)
Glucose-Capillary: 328 mg/dL — ABNORMAL HIGH (ref 70–99)
Glucose-Capillary: 322 mg/dL — ABNORMAL HIGH (ref 70–99)

## 2018-07-06 LAB — BASIC METABOLIC PANEL
Anion gap: 13 (ref 5–15)
BUN: 114 mg/dL — ABNORMAL HIGH (ref 8–23)
CO2: 30 mmol/L (ref 22–32)
Calcium: 8.6 mg/dL — ABNORMAL LOW (ref 8.9–10.3)
Chloride: 93 mmol/L — ABNORMAL LOW (ref 98–111)
Creatinine, Ser: 2.91 mg/dL — ABNORMAL HIGH (ref 0.44–1.00)
GFR calc Af Amer: 17 mL/min — ABNORMAL LOW (ref >60)
GFR calc non Af Amer: 15 mL/min — ABNORMAL LOW (ref >60)
Glucose, Bld: 271 mg/dL — ABNORMAL HIGH (ref 70–99)
Potassium: 4.4 mmol/L (ref 3.5–5.1)
Sodium: 136 mmol/L (ref 135–145)

## 2018-07-06 LAB — CBC
HCT: 24.6 % — ABNORMAL LOW (ref 36.0–46.0)
Hemoglobin: 8 g/dL — ABNORMAL LOW (ref 12.0–15.0)
MCH: 28.6 pg (ref 26.0–34.0)
MCHC: 32.5 g/dL (ref 30.0–36.0)
MCV: 87.9 fL (ref 80.0–100.0)
Platelets: 306 10*3/uL (ref 150–400)
RBC: 2.8 MIL/uL — ABNORMAL LOW (ref 3.87–5.11)
RDW: 14.7 % (ref 11.5–15.5)
WBC: 10 10*3/uL (ref 4.0–10.5)
nRBC: 0 % (ref 0.0–0.2)

## 2018-07-06 LAB — MRSA PCR SCREENING: MRSA by PCR: NEGATIVE

## 2018-07-06 MED ORDER — COLLAGENASE 250 UNIT/GM EX OINT
TOPICAL_OINTMENT | Freq: Every day | CUTANEOUS | Status: DC
  Administered 2018-07-06 – 2018-07-10 (×3): via TOPICAL
  Filled 2018-07-06: qty 30

## 2018-07-06 MED ORDER — INSULIN GLARGINE 100 UNIT/ML ~~LOC~~ SOLN
15.0000 [IU] | Freq: Every day | SUBCUTANEOUS | Status: DC
  Administered 2018-07-06: 15 [IU] via SUBCUTANEOUS
  Filled 2018-07-06 (×2): qty 0.15

## 2018-07-06 MED ORDER — SODIUM CHLORIDE 0.45 % IV SOLN
INTRAVENOUS | Status: AC
  Administered 2018-07-06: 10:00:00 via INTRAVENOUS

## 2018-07-06 MED ORDER — INSULIN ASPART 100 UNIT/ML ~~LOC~~ SOLN
4.0000 [IU] | Freq: Three times a day (TID) | SUBCUTANEOUS | Status: DC
  Administered 2018-07-06 (×2): 4 [IU] via SUBCUTANEOUS

## 2018-07-06 MED ORDER — BENZONATATE 100 MG PO CAPS
200.0000 mg | ORAL_CAPSULE | Freq: Three times a day (TID) | ORAL | Status: DC | PRN
  Administered 2018-07-06 (×2): 200 mg via ORAL
  Filled 2018-07-06 (×2): qty 2

## 2018-07-06 NOTE — Plan of Care (Signed)
  Problem: Education: Goal: Knowledge of General Education information will improve Description Including pain rating scale, medication(s)/side effects and non-pharmacologic comfort measures Outcome: Progressing   

## 2018-07-06 NOTE — Consult Note (Signed)
[facility] Nurse wound consult note Reason for Consult: venous ulcerations and sacral ulcerations Patient was seen by this [place] nurse last on 06/18/18 When seen at that time patient did not have heel and foot pressure injuries or left ischium pressure injury Wound type: 1. Right buttock; Stage 3 pressure injury in the presence of moisture associated skin damage as well (present at the time of the last ad 2. Gluteal cleft fissure (present at the time of the last admission) 3. Right heel deep tissue pressure injury 4. Left heel deep tissue pressure injury 5. Left forth toe deep tissue pressure injury 6. Left ischium unstageable pressure injury 7. Left lateral foot deep tissue pressure injury  8. Left lateral leg venous stasis ulceration; almost healing (present at the time of the last admission) Pressure Injury POA: Yes Measurement: 1. Right buttock; 1cm x 0.5cm x 0.1cm; 100% clean, pink 2. Fissure; 100% clean linear area 3. Right heel; 50% deep dark ruddy open tissue;50% unroofed blood blister consistent with DTPI 4. Left heel: 2cm x 2cm x 0cm; 100% dark blood filled blister  5. Left 4th toe; 0.3cm x 0.2cmx 0cm; dark blood filled blister 6. Left lateral ischium: 1cm x 0.3cm x 0.1cm; 100% yellow/brown non viable tissue 7.  Left lateral foot; 8cm x 4cm x 0cm; dark blood filled blister  8. Left lateral leg: 2cm x 1.0cm x 0.1cm; 100% clean pink, evidence of healing Wound bed:see above  Drainage (amount, consistency, odor) minimal from all sites, no odor Periwound: intact, severe lateral rotation of the bilateral LE which equates to the presence of the pressure injuries all on the lateral aspect of the legs and heels  Dressing procedure/placement/frequency: 1. Add enzymatic debridement ointment to the left ischium daily 2. Add foam to the buttock and gluteal cleft 3. Add painting with betadine to the bilateral foot/heel/toe wounds described.  4. Add Prevalon boots bilaterally using wedge to attempt to  offload less of the lateral rotation and heels 5. Silicone foam to the left lateral leg wound.  6. Add low air loss mattress for moisture management and pressure redistribution.  Requested from patient to have MD contact sister with an update. Patient verbalized concerns with returning to current SNF.   Notified MD via secure chat.  [place], [place], [place]

## 2018-07-06 NOTE — Evaluation (Signed)
Physical Therapy Evaluation and Discharge Patient Details Name: [name] MRN: [id number] DOB: Apr 25, 1940 Today's Date: 07/06/2018   History of Present Illness  Pt is a 78 y/o female admitted secondary to AKI on CKD and possible UTI. PMH includes CHF, COPD on 2L of O2, DM, HTN, sacral ulcer, and bilateral heel ulcers.   Clinical Impression  Pt admitted secondary to problem above with deficits below. Pt presenting with generalized weakness in BLE. Required assist to perform supine HEP. Required mod A to roll from side to side. Pt reports at SNF they were using hoyer for transfers. Reports she was working with therapy 1 day/week. Pt reports she would like to go to another SNF at d/c if possible. Feel all further needs can be met at SNF. Will sign off. If needs change, please re-consult.     Follow Up Recommendations SNF    Equipment Recommendations  None recommended by PT    Recommendations for Other Services       Precautions / Restrictions Precautions Precautions: Fall Restrictions Weight Bearing Restrictions: No      Mobility  Bed Mobility Overal bed mobility: Needs Assistance Bed Mobility: Rolling Rolling: Mod assist         General bed mobility comments: Mod A to roll from side to side. Pt able to assist with use of bed rails.   Transfers                 General transfer comment: Pt uses hoyer at baseline   Ambulation/Gait                Stairs            Wheelchair Mobility    Modified Rankin (Stroke Patients Only)       Balance                                             Pertinent Vitals/Pain Pain Assessment: No/denies pain    Home Living Family/patient expects to be discharged to:: Skilled nursing facility                      Prior Function Level of Independence: Needs assistance   Gait / Transfers Assistance Needed: Reports using hoyer lift at SNF for transfers. Has been mostly staying in the  bed.   ADL's / Homemaking Assistance Needed: Reports staff assists with all ADLs.         Hand Dominance        Extremity/Trunk Assessment   Upper Extremity Assessment Upper Extremity Assessment: Defer to OT evaluation    Lower Extremity Assessment Lower Extremity Assessment: Generalized weakness;RLE deficits/detail;LLE deficits/detail RLE Deficits / Details: R heel ulcer. Feet externally rotated. Was able to perform heel slide with assist. No AROM noted at ankles.  LLE Deficits / Details: L heel ulcer. Feet externally rotated. Was able to perform heel slide with assist. No AROM noted at ankles.     Cervical / Trunk Assessment Cervical / Trunk Assessment: Normal  Communication   Communication: No difficulties  Cognition Arousal/Alertness: Awake/alert Behavior During Therapy: WFL for tasks assessed/performed Overall Cognitive Status: Within Functional Limits for tasks assessed  General Comments General comments (skin integrity, edema, etc.): Pt reports she would like to go to different SNF at d/c     Exercises General Exercises - Lower Extremity Quad Sets: AROM;Both;5 reps;Supine Heel Slides: AAROM;Both;5 reps;Supine Hip ABduction/ADduction: AAROM;Both;5 reps;Supine   Assessment/Plan    PT Assessment All further PT needs can be met in the next venue of care  PT Problem List Decreased strength;Decreased activity tolerance;Decreased mobility;Decreased balance;Decreased range of motion;Pain;Decreased knowledge of use of DME       PT Treatment Interventions      PT Goals (Current goals can be found in the Care Plan section)  Acute Rehab PT Goals Patient Stated Goal: to go to a different SNF  PT Goal Formulation: With patient Time For Goal Achievement: 07/06/18 Potential to Achieve Goals: Fair    Frequency     Barriers to discharge        Co-evaluation               AM-PAC PT "6 Clicks" Mobility   Outcome Measure Help needed turning from your back to your side while in a flat bed without using bedrails?: A Lot Help needed moving from lying on your back to sitting on the side of a flat bed without using bedrails?: A Lot Help needed moving to and from a bed to a chair (including a wheelchair)?: Total Help needed standing up from a chair using your arms (e.g., wheelchair or bedside chair)?: Total Help needed to walk in hospital room?: Total Help needed climbing 3-5 steps with a railing? : Total 6 Click Score: 8    End of Session   Activity Tolerance: Patient tolerated treatment well Patient left: in bed;with call bell/phone within reach;with bed alarm set Nurse Communication: Mobility status PT Visit Diagnosis: Difficulty in walking, not elsewhere classified (R26.2)    Time: 3151-7616 PT Time Calculation (min) (ACUTE ONLY): 16 min   Charges:   PT Evaluation $PT Eval Low Complexity: [place], PT, DPT  Acute Rehabilitation Services  Pager: [phone] Office: [phone]   Rudean Hitt 07/06/2018, 12:59 PM

## 2018-07-06 NOTE — TOC Initial Note (Signed)
Transition of Care [facility]) - Initial/Assessment Note    Patient Details  Name: [name] MRN: [id number] Date of Birth: 27-Jun-1940  Transition of Care [facility]) CM/SW Contact:    Candie Chroman, LCSW Phone Number: 07/06/2018, 2:18 PM  Clinical Narrative: CSW met with patient, introduced role, and explained that PT recommendations would be discussed. Patient was admitted from [facility] where she had been getting rehab from 6/10 until admission. This was about two weeks so she probably only has one week that would be fully covered by insurance until her copays of around $160 per day start. She does not want to return to [place]. Provided CMS scores for facilities within 25 miles of her zip code. She is considering [facility] but wants to review all of her options. No further concerns. CSW encouraged patient to contact CSW as needed. CSW will continue to follow patient and facilitate discharge to SNF once medically stable.               Expected Discharge Plan: Skilled Nursing Facility Barriers to Discharge: Insurance Authorization, Continued Medical Work up   Patient Goals and CMS Choice Patient states their goals for this hospitalization and ongoing recovery are:: "I want to get good therapy so I can go home." CMS Medicare.gov Compare Post Acute Care list provided to:: Patient    Expected Discharge Plan and Services Expected Discharge Plan: [place] Choice: [place] arrangements for the past 2 months: Single Family Home Expected Discharge Date: 07/07/18                                    Prior Living Arrangements/Services Living arrangements for the past 2 months: Single Family Home   Patient language and need for interpreter reviewed:: Yes(No needs.) Do you feel safe going back to the place where you live?: Yes      Need for Family Participation in Patient Care: Yes (Comment) Care giver  support system in place?: Yes (comment)   Criminal Activity/Legal Involvement Pertinent to Current Situation/Hospitalization: No - Comment as needed  Activities of Daily Living Home Assistive Devices/Equipment: None ADL Screening (condition at time of admission) Patient's cognitive ability adequate to safely complete daily activities?: Yes Is the patient deaf or have difficulty hearing?: No Does the patient have difficulty seeing, even when wearing glasses/contacts?: No Does the patient have difficulty concentrating, remembering, or making decisions?: No Patient able to express need for assistance with ADLs?: Yes Does the patient have difficulty dressing or bathing?: Yes Independently performs ADLs?: No Does the patient have difficulty walking or climbing stairs?: Yes Weakness of Legs: Both Weakness of Arms/Hands: Both  Permission Sought/Granted Permission sought to share information with : Facility Sport and exercise psychologist, Family Supports Permission granted to share information with : Yes, Verbal Permission Granted  Share Information with NAME: Doristine Counter  Permission granted to share info w AGENCY: SNF's  Permission granted to share info w Relationship: Niece  Permission granted to share info w Contact Information: [phone]  Emotional Assessment Appearance:: Appears stated age Attitude/Demeanor/Rapport: Engaged, Gracious Affect (typically observed): Accepting, Appropriate, Calm, Pleasant Orientation: : Oriented to Self, Oriented to Place, Oriented to  Time, Oriented to Situation Alcohol / Substance Use: Never Used Psych Involvement: No (comment)  Admission diagnosis:  Acute renal failure superimposed on stage 4 chronic kidney disease, unspecified acute renal failure type (  [place]) [N17.9, N18.4] Patient Active Problem List   Diagnosis Date Noted  . Acute on chronic renal failure ([place]) 07/05/2018  . Essential hypertension 07/05/2018  . CHF (congestive heart failure), NYHA class  IV, chronic, combined ([place]) 07/05/2018  . Diabetic hyperosmolar non-ketotic state ([place]) 06/17/2018  . Hyperglycemia 06/16/2018  . Type 2 diabetes mellitus ([place]) 06/16/2018  . UTI (urinary tract infection) 06/16/2018  . Sacral decubitus ulcer 06/16/2018  . Acute respiratory failure with hypoxia ([place]) 04/04/2017  . Hypertensive urgency 04/04/2017  . CKD (chronic kidney disease) stage 4, GFR 15-29 ml/min (HCC) 04/04/2017  . Hypoglycemia   . Acute renal failure superimposed on stage 3 chronic kidney disease ([place]) 11/23/2016  . Metabolic acidosis with normal anion gap and bicarbonate losses 11/23/2016  . Acute exacerbation of CHF (congestive heart failure) ([place]) 11/22/2016  . Acute on chronic respiratory failure with hypoxia ([place]) 11/22/2016  . Midfoot ulcer, left, limited to breakdown of skin ([place]) 06/13/2016  . Idiopathic chronic venous hypertension of both lower extremities with inflammation 03/10/2016  . Onychomycosis 03/10/2016  . Chronic obstructive pulmonary disease ([place]) 11/07/2013  . Upper airway cough syndrome 11/07/2013  . Insomnia 12/10/2012  . OSA (obstructive sleep apnea) 09/17/2012  . Benign neoplasm of colon 08/14/2012  . Rectal bleeding 08/13/2012    Class: Acute  . Anemia 08/13/2012    Class: Acute  . Osteoarthritis 08/13/2012    Class: Chronic  . Acute posthemorrhagic anemia 08/13/2012  . Diverticulosis of colon (without mention of hemorrhage) 08/13/2012  . Hoarseness 07/20/2011  . COPD with asthma ([place]) 04/20/2011  . Allergic rhinitis 04/20/2011  . GI bleed 02/08/2011  . Personal history of colonic polyps 04/16/2010  . Fecal incontinence 03/23/2010  . Change in bowel habits 03/23/2010   PCP:  Burnard Bunting, MD Pharmacy:   [id number] [place] [id number]: [phone]: [phone]    Social Determinants of Health (SDOH) Interventions    Readmission Risk  Interventions Readmission Risk Prevention Plan 06/18/2018  Transportation Screening Complete  PCP or Specialist Appt within 3-5 Days Not Complete  Not Complete comments not yet ready for d/c  [place] or [place] Complete  HRI or Home Care Consult comments CM will follow for needs  Social Work Consult for [place]/Counseling Complete  Palliative Care Screening Not Applicable  Medication Review (Press photographer Complete  Some recent data might be hidden

## 2018-07-06 NOTE — NC FL2 (Signed)
[place] MEDICAID FL2 LEVEL OF CARE SCREENING TOOL     IDENTIFICATION  Patient Name: [name] Birthdate: 12-02-1940 Sex: female Admission Date (Current Location): 07/05/2018  [facility] and [place] Number:  Herbalist and Address:  [facility], [place] [address] [place], [place] [address]      Provider Number: [id number]  Attending Physician Name and Address:  Domenic Polite, MD  Relative Name and Phone Number:       Current Level of Care: Hospital Recommended Level of Care: [place] Prior Approval Number:    Date Approved/Denied:   PASRR Number: [id number]  Discharge Plan: SNF    Current Diagnoses: Patient Active Problem List   Diagnosis Date Noted  . Acute on chronic renal failure ([place]) 07/05/2018  . Essential hypertension 07/05/2018  . CHF (congestive heart failure), NYHA class IV, chronic, combined ([place]) 07/05/2018  . Diabetic hyperosmolar non-ketotic state ([place]) 06/17/2018  . Hyperglycemia 06/16/2018  . Type 2 diabetes mellitus ([place]) 06/16/2018  . UTI (urinary tract infection) 06/16/2018  . Sacral decubitus ulcer 06/16/2018  . Acute respiratory failure with hypoxia ([place]) 04/04/2017  . Hypertensive urgency 04/04/2017  . CKD (chronic kidney disease) stage 4, GFR 15-29 ml/min (HCC) 04/04/2017  . Hypoglycemia   . Acute renal failure superimposed on stage 3 chronic kidney disease ([place]) 11/23/2016  . Metabolic acidosis with normal anion gap and bicarbonate losses 11/23/2016  . Acute exacerbation of CHF (congestive heart failure) ([place]) 11/22/2016  . Acute on chronic respiratory failure with hypoxia ([place]) 11/22/2016  . Midfoot ulcer, left, limited to breakdown of skin ([place]) 06/13/2016  . Idiopathic chronic venous hypertension of both lower extremities with inflammation 03/10/2016  . Onychomycosis 03/10/2016  . Chronic obstructive pulmonary disease ([place]) 11/07/2013  . Upper airway cough syndrome 11/07/2013  .  Insomnia 12/10/2012  . OSA (obstructive sleep apnea) 09/17/2012  . Benign neoplasm of colon 08/14/2012  . Rectal bleeding 08/13/2012    Class: Acute  . Anemia 08/13/2012    Class: Acute  . Osteoarthritis 08/13/2012    Class: Chronic  . Acute posthemorrhagic anemia 08/13/2012  . Diverticulosis of colon (without mention of hemorrhage) 08/13/2012  . Hoarseness 07/20/2011  . COPD with asthma ([place]) 04/20/2011  . Allergic rhinitis 04/20/2011  . GI bleed 02/08/2011  . Personal history of colonic polyps 04/16/2010  . Fecal incontinence 03/23/2010  . Change in bowel habits 03/23/2010    Orientation RESPIRATION BLADDER Height & Weight     Self, Time, Situation, Place  O2(Nasal Canula 2 L) Incontinent, External catheter Weight: 169 lb 12.1 oz (77 kg) Height:  5' 5.5" (166.4 cm)  BEHAVIORAL SYMPTOMS/MOOD NEUROLOGICAL BOWEL NUTRITION STATUS  (None) (None) Incontinent Diet(Heart healthy/carb modified.)  AMBULATORY STATUS COMMUNICATION OF NEEDS Skin   Total Care Verbally Other (Comment), PU Stage and Appropriate Care(Blister, MASD. Deep tissue injuries on both heels (no dressing), left anterior lateral foot (no dressing), and left 4th toe (no dressing). Unstageable pressure injury on left posterior thigh (no dressing).)   PU Stage 2 Dressing: (Mid sacrum: Foam (Lift dressing to assess every shift). Right buttocks: No dressing. Left posterior tibial: No dressing.) PU Stage 3 Dressing: No Dressing(Left buttocks.)                 Personal Care Assistance Level of Assistance  Bathing, Feeding Bathing Assistance: Maximum assistance Feeding assistance: Limited assistance Dressing Assistance: Maximum assistance     Functional Limitations Info  Sight, Speech, Hearing Sight Info: Adequate Hearing Info:  Adequate Speech Info: Adequate    SPECIAL CARE FACTORS FREQUENCY  PT (By licensed PT), OT (By licensed OT)     PT Frequency: 5 x week OT Frequency: 5 x week            Contractures  Contractures Info: Not present    Additional Factors Info  Code Status, Allergies Code Status Info: Full code Allergies Info: "All generics make her sick"           Current Medications (07/06/2018):  This is the current hospital active medication list Current Facility-Administered Medications  Medication Dose Route Frequency Provider Last Rate Last Dose  . 0.45 % sodium chloride infusion   Intravenous Continuous Domenic Polite, MD 75 mL/hr at 07/06/18 [phone]    . acetaminophen (TYLENOL) tablet 650 mg  650 mg Oral Q6H PRN Vashti Hey, MD       Or  . acetaminophen (TYLENOL) suppository 650 mg  650 mg Rectal Q6H PRN Bonnell Public Tublu, MD      . amLODipine (NORVASC) tablet 10 mg  10 mg Oral Daily Skeet Simmer, RPH   10 mg at 07/06/18 1000  . benzonatate (TESSALON) capsule 200 mg  200 mg Oral TID PRN Arby Barrette A, NP   200 mg at 07/06/18 0332  . cefTRIAXone (ROCEPHIN) 1 g in sodium chloride 0.9 % 100 mL IVPB  1 g Intravenous Q24H Bonnell Public Tublu, MD 200 mL/hr at 07/05/18 1732 1 g at 07/05/18 1732  . collagenase (SANTYL) ointment   Topical Daily Domenic Polite, MD      . heparin injection 5,000 Units  5,000 Units Subcutaneous Q8H Vashti Hey, MD   5,000 Units at 07/06/18 1249  . insulin aspart (novoLOG) injection 0-9 Units  0-9 Units Subcutaneous TID WC Vashti Hey, MD   7 Units at 07/06/18 1251  . insulin aspart (novoLOG) injection 4 Units  4 Units Subcutaneous TID WC Domenic Polite, MD   4 Units at 07/06/18 1250  . insulin glargine (LANTUS) injection 15 Units  15 Units Subcutaneous QHS Domenic Polite, MD      . ipratropium-albuterol (DUONEB) 0.5-2.5 (3) MG/3ML nebulizer solution 3 mL  3 mL Nebulization Q6H PRN Jamse Arn Kyra Searles, MD         Discharge Medications: Please see discharge summary for a list of discharge medications.  Relevant Imaging Results:  Relevant Lab Results:   Additional  Information SS#: [id number]. Was at [facility]. Does not want to return.  Candie Chroman, LCSW

## 2018-07-06 NOTE — Evaluation (Signed)
Occupational Therapy Evaluation Patient Details Name: [name] MRN: [id number] DOB: [birth year] Today's Date: 07/06/2018    History of Present Illness Pt is a 78 y/o female admitted secondary to AKI on CKD and possible UTI. PMH includes CHF, COPD on 2L of O2, DM, HTN, sacral ulcer, and bilateral heel ulcers.    Clinical Impression   This 78 yo female admitted with above presents to acute OT with increased pain (hands and feet) and decreased mobility (somewhat pre-existing). She used her arms and hand functionally today but they are weak--she will benefit from acute OT to focus on Bil UE strengthening to help with ADLs and decrease burden of care.    Follow Up Recommendations  SNF;Supervision/Assistance - 24 hour    Equipment Recommendations  None recommended by OT       Precautions / Restrictions Precautions Precautions: Fall Restrictions Weight Bearing Restrictions: No             ADL either performed or assessed with clinical judgement   ADL Overall ADL's : Needs assistance/impaired Eating/Feeding: Set up;Bed level   Grooming: Minimal assistance;Bed level;Wash/dry face;Oral care   Upper Body Bathing: Moderate assistance;Bed level   Lower Body Bathing: Total assistance;Bed level   Upper Body Dressing : Maximal assistance;Bed level   Lower Body Dressing: Total assistance;Bed level                       Vision Patient Visual Report: No change from baseline              Pertinent Vitals/Pain Pain Assessment: Faces Faces Pain Scale: Hurts even more Pain Location: hands and feet Pain Descriptors / Indicators: Burning;Sharp Pain Intervention(s): Limited activity within patient's tolerance;Monitored during session;Repositioned;Patient requesting pain meds-RN notified     Hand Dominance Right   Extremity/Trunk Assessment Upper Extremity Assessment Upper Extremity Assessment: RUE deficits/detail;LUE deficits/detail RUE Deficits / Details: decreasd  strength and increased pain in joints per pt LUE Deficits / Details: decreasd strength and increased pain in joints per pt           Communication Communication Communication: No difficulties   Cognition Arousal/Alertness: Awake/alert Behavior During Therapy: WFL for tasks assessed/performed Overall Cognitive Status: Within Functional Limits for tasks assessed                                        Exercises Other Exercises Other Exercises: Pt able to move her arms and hand functionally for washing face and brushing teeth but complained about weakness and pain in joints of hands only as far as UEs go        Home Living Family/patient expects to be discharged to:: Skilled nursing facility                                        Prior Functioning/Environment    Gait / Transfers Assistance Needed: Reports using hoyer lift at SNF for transfers. Has been mostly staying in the bed.  ADL's / Homemaking Assistance Needed: Reports staff assists with all ADLs.    Comments: reports wearing O2 only at night        OT Problem List: Decreased strength;Impaired UE functional use;Pain      OT Treatment/Interventions: Self-care/ADL training;Therapeutic exercise;Therapeutic activities;Patient/family education    OT Goals(Current goals can be  found in the care plan section) Acute Rehab OT Goals Patient Stated Goal: to go to a different SNF and get meds for hand and foot pain OT Goal Formulation: With patient Time For Goal Achievement: 07/20/18 Potential to Achieve Goals: Good  OT Frequency: Min 2X/week              AM-PAC OT "6 Clicks" Daily Activity     Outcome Measure Help from another person eating meals?: A Little Help from another person taking care of personal grooming?: A Little Help from another person toileting, which includes using toliet, bedpan, or urinal?: Total Help from another person bathing (including washing, rinsing, drying)?: A  Lot Help from another person to put on and taking off regular upper body clothing?: A Lot Help from another person to put on and taking off regular lower body clothing?: Total 6 Click Score: 12   End of Session    Activity Tolerance: Patient tolerated treatment well Patient left: in bed;with call bell/phone within reach  OT Visit Diagnosis: Other abnormalities of gait and mobility (R26.89);Muscle weakness (generalized) (M62.81);Pain Pain - Right/Left: (both) Pain - part of body: (hands and feet)                Time: 0511-0211 OT Time Calculation (min): 25 min Charges:  OT General Charges $OT Visit: 1 Visit OT Evaluation $OT Eval Moderate Complexity: 1 Mod OT Treatments $Self Care/Home Management : 8-22 mins  [address] [address], OTR/L Acute [employer] Pager [phone] Office [phone]     Almon Register 07/06/2018, 5:06 PM

## 2018-07-06 NOTE — Progress Notes (Signed)
PROGRESS NOTE    [name]  ION:[id number] DOB: December 28, 1940 DOA: 07/05/2018 PCP: Burnard Bunting, MD  Brief Narrative:[name] is an 78 y.o. female with PMH significant fortype 2 diabetes, chronic diastolic congestive heart failure,CKD stage 4,hypertension, hyperlipidemia, COPD on home oxygen, GERD, OSA who is sent in from [place] due to elevated of creatinine seen on routine labs. She had recently been in house for management of CHF and discharged 10/30/2018 on torsemide 50 mg p.o. twice daily. Patient admits to feeling weak and tired but states that is not new -In the emergency room she was noted to have a creatinine of 3.9 up from baseline of 2.5   Assessment & Plan:   Acute kidney injury on chronic kidney disease stage IV -Suspect this is prerenal from overdiuresis, she was taking 50 mg twice daily of torsemide, likely poor oral intake as well recently -Baseline creatinine is 2.5, creatinine 3.9 on admission and BUN significantly elevated at 131, compared to 66 from 2 weeks ago -Clinically appears dry, holding diuretics, gentle IV fluids for 12 hours -Monitor volume status closely  Possible UTI -Very vague symptoms, UA grossly abnormal -Started on IV ceftriaxone last night on admission we will continue this today, follow-up urine cultures  Chronic diastolic CHF -Clinically intravascularly volume depleted at this time -Diuretics on hold, gentle fluids today, see above  COPD/chronic respiratory failure on 2 to 3 L home O2 -Stable, continue PRN inhaled bronchodilators and Singulair  Essential hypertension -Continue amlodipine, torsemide on hold  Type 2 diabetes mellitus -Resume Lantus, at lower dose she takes 30 units nightly we will resume at 15 units due to AKI and add NovoLog pre-meal  Sacral decubitus ulcers and heel ulcers -Due to pressure -Wound care consult requested, input appreciated  Debility/deconditioning -Also has diabetic neuropathy, now  wheelchair-bound at baseline  DVT prophylaxis: Heparin subcutaneous Code Status: Full code Family Communication: No family at bedside Disposition Plan: Back to SNF when improved  Consultants:      Procedures:   Antimicrobials:    Subjective: -Feels weak overall, denies any nausea vomiting diarrhea, denies dyspnea  Objective: Vitals:   07/06/18 0051 07/06/18 0600 07/06/18 0600 07/06/18 0732  BP: (!) 146/73  (!) 144/67 138/63  Pulse: 77  74 74  Resp: 16  17 20   Temp: 98.7 F (37.1 C)  98.7 F (37.1 C) 99.6 F (37.6 C)  TempSrc: Oral  Oral Oral  SpO2: 97%  99% 100%  Weight:  77 kg    Height:        Intake/Output Summary (Last 24 hours) at 07/06/2018 1035 Last data filed at 07/06/2018 2440 Gross per 24 hour  Intake 1080 ml  Output 1050 ml  Net 30 ml   Filed Weights   07/05/18 1519 07/06/18 0600  Weight: 76.7 kg 77 kg    Examination:  General exam: Elderly frail chronically ill-appearing female, sitting up in bed, alert awake oriented x2, no distress  respiratory system: Poor air movement bilaterally, otherwise clear Cardiovascular system: S1 & S2 heard, RRR.   Gastrointestinal system: Abdomen is nondistended, soft and nontender.Normal bowel sounds heard. Central nervous system: Alert and oriented x2.  Bilateral lower extremity weakness debility Extremities: No edema, both lower legs externally rotated, Skin: Multiple pressure ulcers in the heel sacrum etc. Psychiatry: Mood & affect appropriate.     Data Reviewed:   CBC: Recent Labs  Lab 07/05/18 1122 07/06/18 0658  WBC 12.4* 10.0  NEUTROABS 8.8*  --   HGB 8.9* 8.0*  HCT 27.6* 24.6*  MCV 89.0 87.9  PLT 364 242   Basic Metabolic Panel: Recent Labs  Lab 07/05/18 1122 07/06/18 0658  NA 136 136  K 4.2 4.4  CL 93* 93*  CO2 28 30  GLUCOSE 297* 271*  BUN 131* 114*  CREATININE 3.30* 2.91*  CALCIUM 8.8* 8.6*  MG 2.2  --   PHOS 3.6  --    GFR: Estimated Creatinine Clearance: 17.1 mL/min (A)  (by C-G formula based on SCr of 2.91 mg/dL (H)). Liver Function Tests: Recent Labs  Lab 07/05/18 1122  AST 18  ALT 17  ALKPHOS 88  BILITOT 0.5  PROT 7.1  ALBUMIN 2.5*   No results for input(s): LIPASE, AMYLASE in the last 168 hours. No results for input(s): AMMONIA in the last 168 hours. Coagulation Profile: No results for input(s): INR, PROTIME in the last 168 hours. Cardiac Enzymes: No results for input(s): CKTOTAL, CKMB, CKMBINDEX, TROPONINI in the last 168 hours. BNP (last 3 results) No results for input(s): PROBNP in the last 8760 hours. HbA1C: No results for input(s): HGBA1C in the last 72 hours. CBG: Recent Labs  Lab 07/05/18 1629 07/05/18 2208 07/06/18 0616  GLUCAP 358* 408* 285*   Lipid Profile: No results for input(s): CHOL, HDL, LDLCALC, TRIG, CHOLHDL, LDLDIRECT in the last 72 hours. Thyroid Function Tests: No results for input(s): TSH, T4TOTAL, FREET4, T3FREE, THYROIDAB in the last 72 hours. Anemia Panel: No results for input(s): VITAMINB12, FOLATE, FERRITIN, TIBC, IRON, RETICCTPCT in the last 72 hours. Urine analysis:    Component Value Date/Time   COLORURINE YELLOW 07/06/2018 0653   APPEARANCEUR HAZY (A) 07/06/2018 0653   LABSPEC 1.013 07/06/2018 0653   PHURINE 5.0 07/06/2018 0653   GLUCOSEU 150 (A) 07/06/2018 0653   HGBUR NEGATIVE 07/06/2018 0653   BILIRUBINUR NEGATIVE 07/06/2018 [place] 07/06/2018 0653   PROTEINUR NEGATIVE 07/06/2018 0653   UROBILINOGEN 0.2 12/16/2011 1717   NITRITE NEGATIVE 07/06/2018 0653   LEUKOCYTESUR LARGE (A) 07/06/2018 0653   Sepsis Labs: @LABRCNTIP (procalcitonin:4,lacticidven:4)  ) Recent Results (from the past 240 hour(s))  SARS Coronavirus 2 (CEPHEID - Performed in [place] hospital lab), Hosp Order     Status: None   Collection Time: 07/05/18 11:37 AM   Specimen: Nasopharyngeal Swab  Result Value Ref Range Status   SARS Coronavirus 2 NEGATIVE NEGATIVE Final    Comment: (NOTE) If result is  NEGATIVE SARS-CoV-2 target nucleic acids are NOT DETECTED. The SARS-CoV-2 RNA is generally detectable in upper and lower  respiratory specimens during the acute phase of infection. The lowest  concentration of SARS-CoV-2 viral copies this assay can detect is 250  copies / mL. A negative result does not preclude SARS-CoV-2 infection  and should not be used as the sole basis for treatment or other  patient management decisions.  A negative result may occur with  improper specimen collection / handling, submission of specimen other  than nasopharyngeal swab, presence of viral mutation(s) within the  areas targeted by this assay, and inadequate number of viral copies  (<250 copies / mL). A negative result must be combined with clinical  observations, patient history, and epidemiological information. If result is POSITIVE SARS-CoV-2 target nucleic acids are DETECTED. The SARS-CoV-2 RNA is generally detectable in upper and lower  respiratory specimens dur ing the acute phase of infection.  Positive  results are indicative of active infection with SARS-CoV-2.  Clinical  correlation with patient history and other diagnostic information is  necessary to determine patient  infection status.  Positive results do  not rule out bacterial infection or co-infection with other viruses. If result is PRESUMPTIVE POSTIVE SARS-CoV-2 nucleic acids MAY BE PRESENT.   A presumptive positive result was obtained on the submitted specimen  and confirmed on repeat testing.  While 2019 novel coronavirus  (SARS-CoV-2) nucleic acids may be present in the submitted sample  additional confirmatory testing may be necessary for epidemiological  and / or clinical management purposes  to differentiate between  SARS-CoV-2 and other Sarbecovirus currently known to infect humans.  If clinically indicated additional testing with an alternate test  methodology [phone]) is advised. The SARS-CoV-2 RNA is generally  detectable  in upper and lower respiratory sp ecimens during the acute  phase of infection. The expected result is Negative. Fact Sheet for Patients:  StrictlyIdeas.no Fact Sheet for Healthcare Providers: BankingDealers.co.za This test is not yet approved or cleared by the [place] FDA and has been authorized for detection and/or diagnosis of SARS-CoV-2 by FDA under an Emergency Use Authorization (EUA).  This EUA will remain in effect (meaning this test can be used) for the duration of the COVID-19 declaration under Section 564(b)(1) of the Act, 21 U.S.C. section 360bbb-3(b)(1), unless the authorization is terminated or revoked sooner. Performed at [facility], [place] [address] [place], [place] [address]   MRSA PCR Screening     Status: None   Collection Time: 07/06/18  1:43 AM   Specimen: Nasopharyngeal  Result Value Ref Range Status   MRSA by PCR NEGATIVE NEGATIVE Final    Comment:        The GeneXpert MRSA Assay (FDA approved for NASAL specimens only), is one component of a comprehensive MRSA colonization surveillance program. It is not intended to diagnose MRSA infection nor to guide or monitor treatment for MRSA infections. Performed at [facility], [place] [address], [place], [place] [address]          Radiology Studies: Dg Chest Port 1 View  Result Date: 07/05/2018 CLINICAL DATA:  Renal failure.  Hypertension. EXAM: PORTABLE CHEST 1 VIEW COMPARISON:  June 16, 2018 FINDINGS: There is slight atelectasis in the left base. There is no edema or consolidation. Heart is borderline enlarged with pulmonary vascularity normal. No adenopathy. There is aortic atherosclerosis. There is degenerative change in each shoulder. IMPRESSION: No edema or consolidation. Slight left base atelectasis. Borderline cardiac enlargement. Aortic Atherosclerosis (ICD10-I70.0). Electronically Signed   By: Lowella Grip III M.D.   On: 07/05/2018  12:25        Scheduled Meds: . amLODipine  10 mg Oral Daily  . collagenase   Topical Daily  . heparin  5,000 Units Subcutaneous Q8H  . insulin aspart  0-9 Units Subcutaneous TID WC  . insulin aspart  4 Units Subcutaneous TID WC  . insulin glargine  15 Units Subcutaneous QHS   Continuous Infusions: . sodium chloride 75 mL/hr at 07/06/18 0958  . cefTRIAXone (ROCEPHIN)  IV 1 g (07/05/18 1732)     LOS: 0 days    Time spent: 77min    Domenic Polite, MD [facility]  07/06/2018, 10:35 AM

## 2018-07-06 NOTE — Care Management Obs Status (Signed)
[facility] NOTIFICATION   Patient Details  Name: [name] MRN: [id number] Date of Birth: December 02, 1940   Medicare Observation Status Notification Given:  Yes    Candie Chroman, LCSW 07/06/2018, 2:00 PM

## 2018-07-07 LAB — CBC
HCT: 25.9 % — ABNORMAL LOW (ref 36.0–46.0)
Hemoglobin: 8.3 g/dL — ABNORMAL LOW (ref 12.0–15.0)
MCH: 28.2 pg (ref 26.0–34.0)
MCHC: 32 g/dL (ref 30.0–36.0)
MCV: 88.1 fL (ref 80.0–100.0)
Platelets: 311 10*3/uL (ref 150–400)
RBC: 2.94 MIL/uL — ABNORMAL LOW (ref 3.87–5.11)
RDW: 14.6 % (ref 11.5–15.5)
WBC: 10.2 10*3/uL (ref 4.0–10.5)
nRBC: 0 % (ref 0.0–0.2)

## 2018-07-07 LAB — BASIC METABOLIC PANEL
Anion gap: 12 (ref 5–15)
BUN: 103 mg/dL — ABNORMAL HIGH (ref 8–23)
CO2: 27 mmol/L (ref 22–32)
Calcium: 8.6 mg/dL — ABNORMAL LOW (ref 8.9–10.3)
Chloride: 97 mmol/L — ABNORMAL LOW (ref 98–111)
Creatinine, Ser: 2.48 mg/dL — ABNORMAL HIGH (ref 0.44–1.00)
GFR calc Af Amer: 21 mL/min — ABNORMAL LOW (ref >60)
GFR calc non Af Amer: 18 mL/min — ABNORMAL LOW (ref >60)
Glucose, Bld: 246 mg/dL — ABNORMAL HIGH (ref 70–99)
Potassium: 4.4 mmol/L (ref 3.5–5.1)
Sodium: 136 mmol/L (ref 135–145)

## 2018-07-07 LAB — GLUCOSE, CAPILLARY
Glucose-Capillary: 244 mg/dL — ABNORMAL HIGH (ref 70–99)
Glucose-Capillary: 157 mg/dL — ABNORMAL HIGH (ref 70–99)
Glucose-Capillary: 200 mg/dL — ABNORMAL HIGH (ref 70–99)

## 2018-07-07 MED ORDER — INSULIN ASPART 100 UNIT/ML ~~LOC~~ SOLN
6.0000 [IU] | Freq: Three times a day (TID) | SUBCUTANEOUS | Status: DC
  Administered 2018-07-07 – 2018-07-10 (×11): 6 [IU] via SUBCUTANEOUS

## 2018-07-07 MED ORDER — INSULIN GLARGINE 100 UNIT/ML ~~LOC~~ SOLN
25.0000 [IU] | Freq: Every day | SUBCUTANEOUS | Status: DC
  Administered 2018-07-07 – 2018-07-09 (×3): 25 [IU] via SUBCUTANEOUS
  Filled 2018-07-07 (×4): qty 0.25

## 2018-07-07 NOTE — Progress Notes (Signed)
PROGRESS NOTE    [name]  FIE:[id number] DOB: 09-Nov-1940 DOA: 07/05/2018 PCP: Burnard Bunting, MD  Brief Narrative:[name] is an 78 y.o. female with PMH significant fortype 2 diabetes, chronic diastolic congestive heart failure,CKD stage 4,hypertension, hyperlipidemia, COPD on home oxygen, GERD, OSA who is sent in from [place] due to elevated of creatinine seen on routine labs. She had recently been in house for management of CHF and discharged 10/30/2018 on torsemide 50 mg p.o. twice daily. -Patient reported generalized weakness -In the emergency room she was noted to have a creatinine of 3.9 up from baseline of 2.5   Assessment & Plan:   Acute kidney injury on chronic kidney disease stage IV -Suspect this is prerenal from overdiuresis, she was taking 50 mg twice daily of torsemide, likely poor oral intake as well recently -Baseline creatinine is 2.5, creatinine 3.9 on admission and BUN significantly elevated at 131, compared to 66 from 2 weeks ago -Improving with hydration, creatinine down to 2.5 which is her baseline, discontinue IV fluids, BUN still considerably higher than baseline, will hold diuretics another day -Monitor volume status closely -Ambulate, PT OT  Possible UTI -Very vague symptoms, UA grossly abnormal -Day 2 of IV ceftriaxone now, follow-up urine cultures  Chronic diastolic CHF -Clinically appears close to euvolemic now, diuretics held, IV fluids discontinued, resume torsemide soon  COPD/chronic respiratory failure on 2 to 3 L home O2 -Stable, continue PRN inhaled bronchodilators and Singulair  Essential hypertension -Continue amlodipine, torsemide on hold  Type 2 diabetes mellitus -CBGs poorly controlled will increase Lantus back to basal dose and increase pre-meal NovoLog  Sacral decubitus ulcers and heel ulcers -Due to pressure -Wound care consult requested, input appreciated  Debility/deconditioning -Also has diabetic neuropathy,  now wheelchair-bound at baseline  DVT prophylaxis: Heparin subcutaneous Code Status: Full code Family Communication: No family at bedside Disposition Plan: Back to SNF possibly Monday  Consultants:      Procedures:   Antimicrobials:    Subjective: -No events overnight, denies any dyspnea, feels a little better than yesterday, still mild chronic weakness  Objective: Vitals:   07/07/18 0052 07/07/18 0446 07/07/18 0908 07/07/18 1155  BP: (!) 149/67 (!) 142/81 (!) 157/67 (!) 158/66  Pulse: 67 73 77 80  Resp:  18 18 18   Temp: (!) 97.4 F (36.3 C) 98.4 F (36.9 C) 99 F (37.2 C) 98.5 F (36.9 C)  TempSrc: Oral Oral Oral Oral  SpO2: 99% 96% 100% 96%  Weight:  72 kg    Height:        Intake/Output Summary (Last 24 hours) at 07/07/2018 1351 Last data filed at 07/07/2018 1304 Gross per 24 hour  Intake 1522.33 ml  Output 1800 ml  Net -277.67 ml   Filed Weights   07/05/18 1519 07/06/18 0600 07/07/18 0446  Weight: 76.7 kg 77 kg 72 kg    Examination:  Gen: Elderly frail chronically ill female, sitting up in bed, alert awake oriented x3, no distress HEENT: PERRLA, Neck supple, no JVD Lungs: Poor air movement bilaterally CVS: S1-S2/regular rate rhythm Abd: soft, Non tender, non distended, BS present Extremities: No edema, both lower legs are externally rotated  skin: Multiple pressure ulcers in the heel, sacrum etc. Psychiatry: Mood & affect appropriate.     Data Reviewed:   CBC: Recent Labs  Lab 07/05/18 1122 07/06/18 0658 07/07/18 0224  WBC 12.4* 10.0 10.2  NEUTROABS 8.8*  --   --   HGB 8.9* 8.0* 8.3*  HCT 27.6* 24.6*  25.9*  MCV 89.0 87.9 88.1  PLT 364 306 643   Basic Metabolic Panel: Recent Labs  Lab 07/05/18 1122 07/06/18 0658 07/07/18 0224  NA 136 136 136  K 4.2 4.4 4.4  CL 93* 93* 97*  CO2 28 30 27   GLUCOSE 297* 271* 246*  BUN 131* 114* 103*  CREATININE 3.30* 2.91* 2.48*  CALCIUM 8.8* 8.6* 8.6*  MG 2.2  --   --   PHOS 3.6  --   --     GFR: Estimated Creatinine Clearance: 19.4 mL/min (A) (by C-G formula based on SCr of 2.48 mg/dL (H)). Liver Function Tests: Recent Labs  Lab 07/05/18 1122  AST 18  ALT 17  ALKPHOS 88  BILITOT 0.5  PROT 7.1  ALBUMIN 2.5*   No results for input(s): LIPASE, AMYLASE in the last 168 hours. No results for input(s): AMMONIA in the last 168 hours. Coagulation Profile: No results for input(s): INR, PROTIME in the last 168 hours. Cardiac Enzymes: No results for input(s): CKTOTAL, CKMB, CKMBINDEX, TROPONINI in the last 168 hours. BNP (last 3 results) No results for input(s): PROBNP in the last 8760 hours. HbA1C: No results for input(s): HGBA1C in the last 72 hours. CBG: Recent Labs  Lab 07/06/18 1143 07/06/18 1636 07/06/18 2106 07/07/18 0615 07/07/18 1152  GLUCAP 317* 328* 322* 244* 157*   Lipid Profile: No results for input(s): CHOL, HDL, LDLCALC, TRIG, CHOLHDL, LDLDIRECT in the last 72 hours. Thyroid Function Tests: No results for input(s): TSH, T4TOTAL, FREET4, T3FREE, THYROIDAB in the last 72 hours. Anemia Panel: No results for input(s): VITAMINB12, FOLATE, FERRITIN, TIBC, IRON, RETICCTPCT in the last 72 hours. Urine analysis:    Component Value Date/Time   COLORURINE YELLOW 07/06/2018 0653   APPEARANCEUR HAZY (A) 07/06/2018 0653   LABSPEC 1.013 07/06/2018 0653   PHURINE 5.0 07/06/2018 0653   GLUCOSEU 150 (A) 07/06/2018 0653   HGBUR NEGATIVE 07/06/2018 0653   BILIRUBINUR NEGATIVE 07/06/2018 [place] 07/06/2018 0653   PROTEINUR NEGATIVE 07/06/2018 0653   UROBILINOGEN 0.2 12/16/2011 1717   NITRITE NEGATIVE 07/06/2018 0653   LEUKOCYTESUR LARGE (A) 07/06/2018 0653   Sepsis Labs: @LABRCNTIP (procalcitonin:4,lacticidven:4)  ) Recent Results (from the past 240 hour(s))  SARS Coronavirus 2 (CEPHEID - Performed in [facility] hospital lab), Hosp Order     Status: None   Collection Time: 07/05/18 11:37 AM   Specimen: Nasopharyngeal Swab  Result Value  Ref Range Status   SARS Coronavirus 2 NEGATIVE NEGATIVE Final    Comment: (NOTE) If result is NEGATIVE SARS-CoV-2 target nucleic acids are NOT DETECTED. The SARS-CoV-2 RNA is generally detectable in upper and lower  respiratory specimens during the acute phase of infection. The lowest  concentration of SARS-CoV-2 viral copies this assay can detect is 250  copies / mL. A negative result does not preclude SARS-CoV-2 infection  and should not be used as the sole basis for treatment or other  patient management decisions.  A negative result may occur with  improper specimen collection / handling, submission of specimen other  than nasopharyngeal swab, presence of viral mutation(s) within the  areas targeted by this assay, and inadequate number of viral copies  (<250 copies / mL). A negative result must be combined with clinical  observations, patient history, and epidemiological information. If result is POSITIVE SARS-CoV-2 target nucleic acids are DETECTED. The SARS-CoV-2 RNA is generally detectable in upper and lower  respiratory specimens dur ing the acute phase of infection.  Positive  results are indicative  of active infection with SARS-CoV-2.  Clinical  correlation with patient history and other diagnostic information is  necessary to determine patient infection status.  Positive results do  not rule out bacterial infection or co-infection with other viruses. If result is PRESUMPTIVE POSTIVE SARS-CoV-2 nucleic acids MAY BE PRESENT.   A presumptive positive result was obtained on the submitted specimen  and confirmed on repeat testing.  While 2019 novel coronavirus  (SARS-CoV-2) nucleic acids may be present in the submitted sample  additional confirmatory testing may be necessary for epidemiological  and / or clinical management purposes  to differentiate between  SARS-CoV-2 and other Sarbecovirus currently known to infect humans.  If clinically indicated additional testing with an  alternate test  methodology [phone]) is advised. The SARS-CoV-2 RNA is generally  detectable in upper and lower respiratory sp ecimens during the acute  phase of infection. The expected result is Negative. Fact Sheet for Patients:  StrictlyIdeas.no Fact Sheet for Healthcare Providers: BankingDealers.co.za This test is not yet approved or cleared by the [place] FDA and has been authorized for detection and/or diagnosis of SARS-CoV-2 by FDA under an Emergency Use Authorization (EUA).  This EUA will remain in effect (meaning this test can be used) for the duration of the COVID-19 declaration under Section 564(b)(1) of the Act, 21 U.S.C. section 360bbb-3(b)(1), unless the authorization is terminated or revoked sooner. Performed at [facility], [place] [address], [place], [place] [address]   MRSA PCR Screening     Status: None   Collection Time: 07/06/18  1:43 AM   Specimen: Nasopharyngeal  Result Value Ref Range Status   MRSA by PCR NEGATIVE NEGATIVE Final    Comment:        The GeneXpert MRSA Assay (FDA approved for NASAL specimens only), is one component of a comprehensive MRSA colonization surveillance program. It is not intended to diagnose MRSA infection nor to guide or monitor treatment for MRSA infections. Performed at [facility], [place] [address] [place], [place] [address]          Radiology Studies: No results found.      Scheduled Meds:  amLODipine  10 mg Oral Daily   collagenase   Topical Daily   heparin  5,000 Units Subcutaneous Q8H   insulin aspart  0-9 Units Subcutaneous TID WC   insulin aspart  6 Units Subcutaneous TID WC   insulin glargine  25 Units Subcutaneous QHS   Continuous Infusions:  cefTRIAXone (ROCEPHIN)  IV 1 g (07/06/18 1657)     LOS: 1 day    Time spent: 32min    Domenic Polite, MD [facility]  07/07/2018, 1:51 PM

## 2018-07-08 LAB — BASIC METABOLIC PANEL
Anion gap: 12 (ref 5–15)
BUN: 90 mg/dL — ABNORMAL HIGH (ref 8–23)
CO2: 27 mmol/L (ref 22–32)
Calcium: 8.7 mg/dL — ABNORMAL LOW (ref 8.9–10.3)
Chloride: 98 mmol/L (ref 98–111)
Creatinine, Ser: 2.34 mg/dL — ABNORMAL HIGH (ref 0.44–1.00)
GFR calc Af Amer: 23 mL/min — ABNORMAL LOW (ref >60)
GFR calc non Af Amer: 20 mL/min — ABNORMAL LOW (ref >60)
Glucose, Bld: 159 mg/dL — ABNORMAL HIGH (ref 70–99)
Potassium: 4 mmol/L (ref 3.5–5.1)
Sodium: 137 mmol/L (ref 135–145)

## 2018-07-08 LAB — GLUCOSE, CAPILLARY
Glucose-Capillary: 136 mg/dL — ABNORMAL HIGH (ref 70–99)
Glucose-Capillary: 132 mg/dL — ABNORMAL HIGH (ref 70–99)
Glucose-Capillary: 175 mg/dL — ABNORMAL HIGH (ref 70–99)
Glucose-Capillary: 238 mg/dL — ABNORMAL HIGH (ref 70–99)

## 2018-07-08 MED ORDER — TORSEMIDE 20 MG PO TABS
20.0000 mg | ORAL_TABLET | Freq: Every day | ORAL | Status: DC
  Administered 2018-07-09 – 2018-07-10 (×2): 20 mg via ORAL
  Filled 2018-07-08 (×2): qty 1

## 2018-07-08 NOTE — Progress Notes (Signed)
PROGRESS NOTE    [name]  LPF:[id number] DOB: [birth year] DOA: 07/05/2018 PCP: Burnard Bunting, MD  Brief Narrative:[name] is an 78 y.o. female with PMH significant fortype 2 diabetes, chronic diastolic congestive heart failure,CKD stage 4,hypertension, hyperlipidemia, COPD on home oxygen, GERD, OSA who is sent in from [place] due to elevated of creatinine seen on routine labs. She had recently been in house for management of CHF and discharged 10/30/2018 on torsemide 50 mg p.o. twice daily. -Patient reported generalized weakness -In the emergency room she was noted to have a creatinine of 3.9 up from baseline of 2.5 and BUN of 131 from 66  Assessment & Plan:   Acute kidney injury on chronic kidney disease stage IV -Suspect this is prerenal from overdiuresis, she was taking 50 mg twice daily of torsemide, likely poor oral intake as well recently -Baseline creatinine is 2.5, creatinine 3.9 on admission and BUN significantly elevated at 131, compared to 66 from 2 weeks ago -Improving with hydration, creatinine down to 2.3 which is better than her baseline, IV fluids discontinued yesterday, BUN still considerably higher than baseline, hold diuretics another day, restart torsemide at lower dose tomorrow and titrate as needed -Clinically appears euvolemic today -Patient is from [place] and wants a new SNF, Education officer, museum working on this, hopefully can go to SNF tomorrow  Possible UTI -Very vague symptoms, urinalysis was grossly abnormal -On IV ceftriaxone, follow-up urine cultures, DC antibiotics if culture negative  Chronic diastolic CHF -Clinically appears euvolemic now, diuretics were held in the setting of AKI, fluids discontinued yesterday, resume torsemide at lower dose tomorrow  COPD/chronic respiratory failure on 2 to 3 L home O2 -Stable, continue PRN inhaled bronchodilators and Singulair  Essential hypertension -Continue amlodipine, torsemide on hold   Type 2 diabetes mellitus -CBGs better controlled now continue current dose of Lantus, NovoLog pre-meal  Sacral decubitus ulcers and heel ulcers -Due to pressure -Wound care consult requested, input appreciated  Debility/deconditioning -Also has diabetic neuropathy, now wheelchair-bound at baseline  DVT prophylaxis: Heparin subcutaneous Code Status: Full code Family Communication: No family at bedside Disposition Plan: New SNF on Monday  Consultants:      Procedures:   Antimicrobials:    Subjective: -Overall feels better, yet to find a new facility, hopes to talk to her niece about this Objective: Vitals:   07/07/18 1943 07/08/18 0556 07/08/18 0944 07/08/18 1141  BP: (!) 147/65 137/72 (!) 145/65 (!) 146/70  Pulse: 87 71 75 75  Resp: 18   18  Temp: 99.6 F (37.6 C) (!) 97.2 F (36.2 C)    TempSrc: Oral     SpO2: 93% 98% 98% 100%  Weight:  71.4 kg    Height:        Intake/Output Summary (Last 24 hours) at 07/08/2018 1317 Last data filed at 07/08/2018 0700 Gross per 24 hour  Intake 240 ml  Output 800 ml  Net -560 ml   Filed Weights   07/06/18 0600 07/07/18 0446 07/08/18 0556  Weight: 77 kg 72 kg 71.4 kg    Examination:  Gen: Early frail chronically ill female, sitting up in bed, AAO x3, no distress HEENT: PERRLA, Neck supple, no JVD Lungs: Poor air movement bilaterally, otherwise clear  CVS: S1-S2/regular rate rhythm Abd: soft, Non tender, non distended, BS present Extremities: No edema, both lower legs and externally rotated, Skin with multiple pressure ulcers on the heels, sacrum Psychiatry: Mood & affect appropriate.     Data Reviewed:  CBC: Recent Labs  Lab 07/05/18 1122 07/06/18 0658 07/07/18 0224  WBC 12.4* 10.0 10.2  NEUTROABS 8.8*  --   --   HGB 8.9* 8.0* 8.3*  HCT 27.6* 24.6* 25.9*  MCV 89.0 87.9 88.1  PLT 364 306 353   Basic Metabolic Panel: Recent Labs  Lab 07/05/18 1122 07/06/18 0658 07/07/18 0224 07/08/18 0824  NA 136  136 136 137  K 4.2 4.4 4.4 4.0  CL 93* 93* 97* 98  CO2 28 30 27 27   GLUCOSE 297* 271* 246* 159*  BUN 131* 114* 103* 90*  CREATININE 3.30* 2.91* 2.48* 2.34*  CALCIUM 8.8* 8.6* 8.6* 8.7*  MG 2.2  --   --   --   PHOS 3.6  --   --   --    GFR: Estimated Creatinine Clearance: 20.5 mL/min (A) (by C-G formula based on SCr of 2.34 mg/dL (H)). Liver Function Tests: Recent Labs  Lab 07/05/18 1122  AST 18  ALT 17  ALKPHOS 88  BILITOT 0.5  PROT 7.1  ALBUMIN 2.5*   No results for input(s): LIPASE, AMYLASE in the last 168 hours. No results for input(s): AMMONIA in the last 168 hours. Coagulation Profile: No results for input(s): INR, PROTIME in the last 168 hours. Cardiac Enzymes: No results for input(s): CKTOTAL, CKMB, CKMBINDEX, TROPONINI in the last 168 hours. BNP (last 3 results) No results for input(s): PROBNP in the last 8760 hours. HbA1C: No results for input(s): HGBA1C in the last 72 hours. CBG: Recent Labs  Lab 07/07/18 0615 07/07/18 1152 07/07/18 2108 07/08/18 0605 07/08/18 1138  GLUCAP 244* 157* 200* 136* 132*   Lipid Profile: No results for input(s): CHOL, HDL, LDLCALC, TRIG, CHOLHDL, LDLDIRECT in the last 72 hours. Thyroid Function Tests: No results for input(s): TSH, T4TOTAL, FREET4, T3FREE, THYROIDAB in the last 72 hours. Anemia Panel: No results for input(s): VITAMINB12, FOLATE, FERRITIN, TIBC, IRON, RETICCTPCT in the last 72 hours. Urine analysis:    Component Value Date/Time   COLORURINE YELLOW 07/06/2018 0653   APPEARANCEUR HAZY (A) 07/06/2018 0653   LABSPEC 1.013 07/06/2018 0653   PHURINE 5.0 07/06/2018 0653   GLUCOSEU 150 (A) 07/06/2018 0653   HGBUR NEGATIVE 07/06/2018 0653   BILIRUBINUR NEGATIVE 07/06/2018 [place] 07/06/2018 0653   PROTEINUR NEGATIVE 07/06/2018 0653   UROBILINOGEN 0.2 12/16/2011 1717   NITRITE NEGATIVE 07/06/2018 0653   LEUKOCYTESUR LARGE (A) 07/06/2018 0653   Sepsis Labs:  @[email or web address] (procalcitonin:4,lacticidven:4)  ) Recent Results (from the past 240 hour(s))  SARS Coronavirus 2 (CEPHEID - Performed in [place] hospital lab), Hosp Order     Status: None   Collection Time: 07/05/18 11:37 AM   Specimen: Nasopharyngeal Swab  Result Value Ref Range Status   SARS Coronavirus 2 NEGATIVE NEGATIVE Final    Comment: (NOTE) If result is NEGATIVE SARS-CoV-2 target nucleic acids are NOT DETECTED. The SARS-CoV-2 RNA is generally detectable in upper and lower  respiratory specimens during the acute phase of infection. The lowest  concentration of SARS-CoV-2 viral copies this assay can detect is 250  copies / mL. A negative result does not preclude SARS-CoV-2 infection  and should not be used as the sole basis for treatment or other  patient management decisions.  A negative result may occur with  improper specimen collection / handling, submission of specimen other  than nasopharyngeal swab, presence of viral mutation(s) within the  areas targeted by this assay, and inadequate number of viral copies  (<250 copies / mL). A  negative result must be combined with clinical  observations, patient history, and epidemiological information. If result is POSITIVE SARS-CoV-2 target nucleic acids are DETECTED. The SARS-CoV-2 RNA is generally detectable in upper and lower  respiratory specimens dur ing the acute phase of infection.  Positive  results are indicative of active infection with SARS-CoV-2.  Clinical  correlation with patient history and other diagnostic information is  necessary to determine patient infection status.  Positive results do  not rule out bacterial infection or co-infection with other viruses. If result is PRESUMPTIVE POSTIVE SARS-CoV-2 nucleic acids MAY BE PRESENT.   A presumptive positive result was obtained on the submitted specimen  and confirmed on repeat testing.  While 2019 novel coronavirus  (SARS-CoV-2) nucleic acids may be present in the  submitted sample  additional confirmatory testing may be necessary for epidemiological  and / or clinical management purposes  to differentiate between  SARS-CoV-2 and other Sarbecovirus currently known to infect humans.  If clinically indicated additional testing with an alternate test  methodology [phone]) is advised. The SARS-CoV-2 RNA is generally  detectable in upper and lower respiratory sp ecimens during the acute  phase of infection. The expected result is Negative. Fact Sheet for Patients:  StrictlyIdeas.no Fact Sheet for Healthcare Providers: BankingDealers.co.za This test is not yet approved or cleared by the [place] FDA and has been authorized for detection and/or diagnosis of SARS-CoV-2 by FDA under an Emergency Use Authorization (EUA).  This EUA will remain in effect (meaning this test can be used) for the duration of the COVID-19 declaration under Section 564(b)(1) of the Act, 21 U.S.C. section 360bbb-3(b)(1), unless the authorization is terminated or revoked sooner. Performed at [facility], [place] [address] [place], [place] [address]   MRSA PCR Screening     Status: None   Collection Time: 07/06/18  1:43 AM   Specimen: Nasopharyngeal  Result Value Ref Range Status   MRSA by PCR NEGATIVE NEGATIVE Final    Comment:        The GeneXpert MRSA Assay (FDA approved for NASAL specimens only), is one component of a comprehensive MRSA colonization surveillance program. It is not intended to diagnose MRSA infection nor to guide or monitor treatment for MRSA infections. Performed at [facility], [place] [address] [place], [place] [address]          Radiology Studies: No results found.      Scheduled Meds: . amLODipine  10 mg Oral Daily  . collagenase   Topical Daily  . heparin  5,000 Units Subcutaneous Q8H  . insulin aspart  0-9 Units Subcutaneous TID WC  . insulin aspart  6 Units  Subcutaneous TID WC  . insulin glargine  25 Units Subcutaneous QHS  . [START ON 07/09/2018] torsemide  20 mg Oral Daily   Continuous Infusions:    LOS: 2 days    Time spent: 62min    Domenic Polite, MD [facility]  07/08/2018, 1:17 PM

## 2018-07-08 NOTE — TOC Progression Note (Signed)
Transition of Care [facility]) - Progression Note    Patient Details  Name: AARICA WAX MRN: [id number] Date of Birth: 13-Sep-1940  Transition of Care [facility]) CM/SW [place], [place] Phone Number: [phone] 07/08/2018, 11:36 AM  Clinical Narrative:    CSW met with patient bedside to discuss the 3 bed offers. Patient informed CSW that she would like her to call niece Tiara to assist with making the decision. CSW called niece and waiting for a call back.  Expected Discharge Plan: Skilled Nursing Facility Barriers to Discharge: Ship broker, Continued Medical Work up  Expected Discharge Plan and Services Expected Discharge Plan: [place] Choice: [place] Living arrangements for the past 2 months: Single Family Home Expected Discharge Date: 07/07/18                                     Social Determinants of Health (SDOH) Interventions    Readmission Risk Interventions Readmission Risk Prevention Plan 06/18/2018  Transportation Screening Complete  PCP or Specialist Appt within 3-5 Days Not Complete  Not Complete comments not yet ready for d/c  HRI or [place] Not Complete  HRI or Home Care Consult comments CM will follow for needs  Social Work Consult for [place] Planning/Counseling Complete  Palliative Care Screening Not Applicable  Medication Review Press photographer) Complete  Some recent data might be hidden

## 2018-07-08 NOTE — TOC Progression Note (Addendum)
Transition of Care [facility]) - Progression Note    Patient Details  Name: [name] MRN: [id number] Date of Birth: 10/05/1940  Transition of Care [facility]) CM/SW [place], [place] Phone Number: [phone] 07/08/2018, 8:34 AM  Clinical Narrative:    CSW met with patient bedside and gave her the 3 bed offers that were available, Patient asked about [facility] and CSW informed patient that they did not have any bed availability. Patient asked CSW to call niece Tiara and discuss options as well. CSW stated that should would check back with patient after she speaks with niece. CSW will continue to follow patient for discharge planning.    Expected Discharge Plan: Skilled Nursing Facility Barriers to Discharge: Ship broker, Continued Medical Work up  Expected Discharge Plan and Services Expected Discharge Plan: [place] Choice: [place] Living arrangements for the past 2 months: Single Family Home Expected Discharge Date: 07/07/18                                     Social Determinants of Health (SDOH) Interventions    Readmission Risk Interventions Readmission Risk Prevention Plan 06/18/2018  Transportation Screening Complete  PCP or Specialist Appt within 3-5 Days Not Complete  Not Complete comments not yet ready for d/c  HRI or [place] Not Complete  HRI or Home Care Consult comments CM will follow for needs  Social Work Consult for [place] Planning/Counseling Complete  Palliative Care Screening Not Applicable  Medication Review Press photographer) Complete  Some recent data might be hidden

## 2018-07-09 LAB — CBC
HCT: 25.1 % — ABNORMAL LOW (ref 36.0–46.0)
Hemoglobin: 8.1 g/dL — ABNORMAL LOW (ref 12.0–15.0)
MCH: 28.6 pg (ref 26.0–34.0)
MCHC: 32.3 g/dL (ref 30.0–36.0)
MCV: 88.7 fL (ref 80.0–100.0)
Platelets: 299 10*3/uL (ref 150–400)
RBC: 2.83 MIL/uL — ABNORMAL LOW (ref 3.87–5.11)
RDW: 15 % (ref 11.5–15.5)
WBC: 9.8 10*3/uL (ref 4.0–10.5)
nRBC: 0 % (ref 0.0–0.2)

## 2018-07-09 LAB — GLUCOSE, CAPILLARY
Glucose-Capillary: 240 mg/dL — ABNORMAL HIGH (ref 70–99)
Glucose-Capillary: 142 mg/dL — ABNORMAL HIGH (ref 70–99)
Glucose-Capillary: 123 mg/dL — ABNORMAL HIGH (ref 70–99)
Glucose-Capillary: 190 mg/dL — ABNORMAL HIGH (ref 70–99)
Glucose-Capillary: 230 mg/dL — ABNORMAL HIGH (ref 70–99)

## 2018-07-09 LAB — BASIC METABOLIC PANEL
Anion gap: 9 (ref 5–15)
BUN: 84 mg/dL — ABNORMAL HIGH (ref 8–23)
CO2: 27 mmol/L (ref 22–32)
Calcium: 8.5 mg/dL — ABNORMAL LOW (ref 8.9–10.3)
Chloride: 103 mmol/L (ref 98–111)
Creatinine, Ser: 2.47 mg/dL — ABNORMAL HIGH (ref 0.44–1.00)
GFR calc Af Amer: 21 mL/min — ABNORMAL LOW (ref >60)
GFR calc non Af Amer: 18 mL/min — ABNORMAL LOW (ref >60)
Glucose, Bld: 221 mg/dL — ABNORMAL HIGH (ref 70–99)
Potassium: 4.5 mmol/L (ref 3.5–5.1)
Sodium: 139 mmol/L (ref 135–145)

## 2018-07-09 MED ORDER — TORSEMIDE 10 MG PO TABS: 20.0000 mg | ORAL_TABLET | Freq: Two times a day (BID) | ORAL | 0 refills | Status: DC

## 2018-07-09 NOTE — TOC Progression Note (Addendum)
Transition of Care [facility]) - Progression Note    Patient Details  Name: [name] MRN: [id number] Date of Birth: March 01, 1940  Transition of Care [facility]) CM/SW [place], LCSW Phone Number: 07/09/2018, 12:42 PM  Clinical Narrative: Patient has not made facility choice yet. CSW called her niece, gave her bed offers ([facility], [facility], and [facility]) and told her how to access CMS scores online. She will review and call CSW back with decision. Patient is aware that she only has about a week left of insurance paying at 100%.  1:01 pm: [facility] will [facility] a bed by the time we get insurance authorization and her COVID results back. Niece is aware. Will notify patient.  Expected Discharge Plan: Skilled Nursing Facility Barriers to Discharge: Ship broker, Continued Medical Work up  Expected Discharge Plan and Services Expected Discharge Plan: [place] Choice: [place] Living arrangements for the past 2 months: Single Family Home Expected Discharge Date: 07/09/18                                     Social Determinants of Health (SDOH) Interventions    Readmission Risk Interventions Readmission Risk Prevention Plan 06/18/2018  Transportation Screening Complete  PCP or Specialist Appt within 3-5 Days Not Complete  Not Complete comments not yet ready for d/c  HRI or [place] Not Complete  HRI or Home Care Consult comments CM will follow for needs  Social Work Consult for [place] Planning/Counseling Complete  Palliative Care Screening Not Applicable  Medication Review Press photographer) Complete  Some recent data might be hidden

## 2018-07-09 NOTE — Discharge Summary (Addendum)
Physician Discharge Summary  [name] [facility] INO:[id number] DOB: [birth year] DOA: 07/05/2018  PCP: Burnard Bunting, MD  Admit date: 07/05/2018 Discharge date: 07/10/2018  Time spent: 35 minutes  Recommendations for Outpatient Follow-up:  1. PCP in 1 week, please check bmet at follow-up, titrate torsemide dose as needed   Discharge Diagnoses:  Principal Problem:   Acute renal failure superimposed on stage 4 chronic kidney disease ([place]) COPD/chronic respiratory failure on 2 L home O2 nightly and as needed Chronic diastolic CHF   Type 2 diabetes mellitus (HCC)   Acute on chronic renal failure (HCC)   Essential hypertension   CHF (congestive heart failure), NYHA class IV, chronic, combined ([place])   Discharge Condition: Improved  Diet recommendation: Low-sodium, diabetic, heart healthy  Filed Weights   07/07/18 0446 07/08/18 0556 07/09/18 0528  Weight: 72 kg 71.4 kg 73 kg    History of present illness:  [name] an 78 y.o.femalewithPMH significant fortype 2 diabetes, chronic diastolic congestive heart failure,CKD stage 4,hypertension, hyperlipidemia, COPD on home oxygen, GERD, OSAwho is sent in [name] due toelevated of creatinine seen on routine labs. She had recently been in house for management of CHF and discharged10/20/2020 on torsemide 50 mg p.o. twice daily. -Patient reported generalized weakness -In the emergency room she was noted to have a creatinine of 3.9 up from baseline of 2.5 and BUN of 131 from [facility] [facility] Hospital Course:   Acute kidney injury on chronic kidney disease stage IV -prerenal from overdiuresis, she was taking 50 mg twice daily of torsemide, likely poor oral intake as well recently -Baseline creatinine is 2.5, creatinine was 3.9 on admission and BUN significantly elevated at 131, compared to 66 from 2 weeks ago -Improved with hydration and holding diuretics briefly, creatinine down to 2.4 which is her baseline, torsemide held for a  couple of days and restarted subsequently at a lower dose, 20 mg twice daily, assess volume status at follow-up and titrate dose as needed, please check Bmet in 1 week -Discharge planning, back to SNF, new facility this time  Possible UTI -Very vague symptoms, urinalysis was grossly abnormal -Treated with 3 days of ceftriaxone, urine culture still pending, antibiotics discontinued  Chronic diastolic CHF -Clinically appears euvolemic now, diuretics were held in the setting of AKI, fluids discontinued yesterday, resumed torsemide at lower dose tomorrow of 20 mg twice daily  COPD/chronic respiratory failure on 2 to 3 L home O2 -Stable, continue PRN inhaled bronchodilators and Singulair  Essential hypertension -Continue amlodipine, torsemide resumed  Type 2 diabetes mellitus -CBGs better controlled now continue current dose of Lantus, NovoLog pre-meal  Sacral decubitus ulcers and heel ulcers -Due to pressure -Wound care consult requested, input appreciated, continue local wound care, pressure offloading  Debility/deconditioning -Also has diabetic neuropathy, now wheelchair-bound at baseline  Discharge Exam: Vitals:   07/09/18 0528 07/09/18 0924  BP: (!) 154/69 (!) 154/65  Pulse: 81 81  Resp: 20 20  Temp: 99 F (37.2 C) 98.6 F (37 C)  SpO2: 100% 100%    General: AAO x3, no distress Cardiovascular: S1-S2/regular rate rhythm Respiratory: Clear bilaterally  Discharge Instructions   Discharge Instructions    Diet - low sodium heart healthy   Complete by: As directed    Diet Carb Modified   Complete by: As directed    Increase activity slowly   Complete by: As directed      Allergies as of 07/09/2018      Reactions   Other Other (See Comments)   "  all generics make her sick"      Medication List    STOP taking these medications   LORazepam 1 MG tablet Commonly known as: ATIVAN   prochlorperazine 5 MG tablet Commonly known as: COMPAZINE     TAKE these  medications   amLODipine 5 MG tablet Commonly known as: NORVASC Take 10 mg by mouth daily.   calcitRIOL 0.25 MCG capsule Commonly known as: ROCALTROL Take 1 capsule (0.25 mcg total) by mouth every Monday, Wednesday, and Friday.   Caltrate 600+D 600-400 MG-UNIT tablet Generic drug: Calcium Carbonate-Vitamin D Take 1 tablet by mouth daily.   cyclobenzaprine 10 MG tablet Commonly known as: FLEXERIL Take 10 mg 3 (three) times daily as needed by mouth for muscle spasms.   feeding supplement (PRO-STAT SUGAR FREE 64) Liqd Take 30 mLs by mouth 2 (two) times daily.   Fish Oil 1000 MG Caps Take 1,000 mg by mouth daily.   folic acid 376 MCG tablet Commonly known as: FOLVITE Take 400 mcg by mouth daily.   insulin aspart 100 UNIT/ML injection Commonly known as: novoLOG Inject 6 Units into the skin 3 (three) times daily with meals. What changed:   how much to take  additional instructions   insulin glargine 100 UNIT/ML injection Commonly known as: LANTUS Inject 0.3 mLs (30 Units total) into the skin at bedtime.   ipratropium-albuterol 0.5-2.5 (3) MG/3ML Soln Commonly known as: DuoNeb Take 3 mLs by nebulization every 6 (six) hours as needed. What changed: reasons to take this   iron polysaccharides 150 MG capsule Commonly known as: NIFEREX Take 150 mg by mouth 2 (two) times daily.   magnesium oxide 400 MG tablet Commonly known as: MAG-OX Take 400 mg by mouth daily.   montelukast 10 MG tablet Commonly known as: Singulair Take 1 tablet (10 mg total) by mouth at bedtime.   pyridOXINE 100 MG tablet Commonly known as: VITAMIN B-6 Take 100 mg by mouth daily.   Systane Balance 0.6 % Soln Generic drug: Propylene Glycol Apply 1 drop to eye 2 (two) times daily as needed (dry eyes).   torsemide 10 MG tablet Commonly known as: DEMADEX Take 2 tablets (20 mg total) by mouth 2 (two) times daily. What changed: how much to take   traZODone 50 MG tablet Commonly known as:  DESYREL Take 50 mg at bedtime as needed by mouth for sleep.   vitamin B-12 1000 MCG tablet Commonly known as: CYANOCOBALAMIN Take 1,000 mcg by mouth daily.   Vitamin D (Ergocalciferol) 1.25 MG (50000 UT) Caps capsule Commonly known as: DRISDOL Take 50,000 Units by mouth 2 (two) times a week.   vitamin E 400 UNIT capsule Take 400 Units by mouth daily.   Zocor 20 MG tablet Generic drug: simvastatin Take 20 mg by mouth daily.      Allergies  Allergen Reactions  . Other Other (See Comments)    "all generics make her sick"      The results of significant diagnostics from this hospitalization (including imaging, microbiology, ancillary and laboratory) are listed below for reference.    Significant Diagnostic Studies: Dg Chest Port 1 View  Result Date: 07/05/2018 CLINICAL DATA:  Renal failure.  Hypertension. EXAM: PORTABLE CHEST 1 VIEW COMPARISON:  June 16, 2018 FINDINGS: There is slight atelectasis in the left base. There is no edema or consolidation. Heart is borderline enlarged with pulmonary vascularity normal. No adenopathy. There is aortic atherosclerosis. There is degenerative change in each shoulder. IMPRESSION: No edema or consolidation. Slight left  base atelectasis. Borderline cardiac enlargement. Aortic Atherosclerosis (ICD10-I70.0). Electronically Signed   By: Lowella Grip III M.D.   On: 07/05/2018 12:25   Dg Chest Port 1 View  Result Date: 06/16/2018 CLINICAL DATA:  Weakness 1 week. Swelling bilateral lower extremities. EXAM: PORTABLE CHEST 1 VIEW COMPARISON:  04/03/2017 FINDINGS: Patient is rotated to the left. Lungs are adequately inflated with subtle hazy opacification of the central pulmonary vasculature likely mild vascular congestion which is improved. No evidence of effusion or lobar consolidation. Mild cardiomegaly. Remainder of the exam is unchanged. IMPRESSION: Mild cardiomegaly with interval improved mild vascular congestion. Electronically Signed   By: Marin Olp M.D.   On: 06/16/2018 21:01    Microbiology: Recent Results (from the past 240 hour(s))  SARS Coronavirus 2 (CEPHEID - Performed in [place] hospital lab), Hosp Order     Status: None   Collection Time: 07/05/18 11:37 AM   Specimen: Nasopharyngeal Swab  Result Value Ref Range Status   SARS Coronavirus 2 NEGATIVE NEGATIVE Final    Comment: (NOTE) If result is NEGATIVE SARS-CoV-2 target nucleic acids are NOT DETECTED. The SARS-CoV-2 RNA is generally detectable in upper and lower  respiratory specimens during the acute phase of infection. The lowest  concentration of SARS-CoV-2 viral copies this assay can detect is 250  copies / mL. A negative result does not preclude SARS-CoV-2 infection  and should not be used as the sole basis for treatment or other  patient management decisions.  A negative result may occur with  improper specimen collection / handling, submission of specimen other  than nasopharyngeal swab, presence of viral mutation(s) within the  areas targeted by this assay, and inadequate number of viral copies  (<250 copies / mL). A negative result must be combined with clinical  observations, patient history, and epidemiological information. If result is POSITIVE SARS-CoV-2 target nucleic acids are DETECTED. The SARS-CoV-2 RNA is generally detectable in upper and lower  respiratory specimens dur ing the acute phase of infection.  Positive  results are indicative of active infection with SARS-CoV-2.  Clinical  correlation with patient history and other diagnostic information is  necessary to determine patient infection status.  Positive results do  not rule out bacterial infection or co-infection with other viruses. If result is PRESUMPTIVE POSTIVE SARS-CoV-2 nucleic acids MAY BE PRESENT.   A presumptive positive result was obtained on the submitted specimen  and confirmed on repeat testing.  While 2019 novel coronavirus  (SARS-CoV-2) nucleic acids may be present  in the submitted sample  additional confirmatory testing may be necessary for epidemiological  and / or clinical management purposes  to differentiate between  SARS-CoV-2 and other Sarbecovirus currently known to infect humans.  If clinically indicated additional testing with an alternate test  methodology [phone]) is advised. The SARS-CoV-2 RNA is generally  detectable in upper and lower respiratory sp ecimens during the acute  phase of infection. The expected result is Negative. Fact Sheet for Patients:  StrictlyIdeas.no Fact Sheet for Healthcare Providers: BankingDealers.co.za This test is not yet approved or cleared by the [place] FDA and has been authorized for detection and/or diagnosis of SARS-CoV-2 by FDA under an Emergency Use Authorization (EUA).  This EUA will remain in effect (meaning this test can be used) for the duration of the COVID-19 declaration under Section 564(b)(1) of the Act, 21 U.S.C. section 360bbb-3(b)(1), unless the authorization is terminated or revoked sooner. Performed at [facility], [place] [address], [place], [place] [address]  MRSA PCR Screening     Status: None   Collection Time: 07/06/18  1:43 AM   Specimen: Nasopharyngeal  Result Value Ref Range Status   MRSA by PCR NEGATIVE NEGATIVE Final    Comment:        The GeneXpert MRSA Assay (FDA approved for NASAL specimens only), is one component of a comprehensive MRSA colonization surveillance program. It is not intended to diagnose MRSA infection nor to guide or monitor treatment for MRSA infections. Performed at [facility], [place] [address], [place], [place] [address]      Labs: Basic Metabolic Panel: Recent Labs  Lab 07/05/18 1122 07/06/18 2423 07/07/18 0224 07/08/18 0824 07/09/18 0318  NA 136 136 136 137 139  K 4.2 4.4 4.4 4.0 4.5  CL 93* 93* 97* 98 103  CO2 28 30 27 27 27   GLUCOSE 297* 271* 246* 159* 221*  BUN  131* 114* 103* 90* 84*  CREATININE 3.30* 2.91* 2.48* 2.34* 2.47*  CALCIUM 8.8* 8.6* 8.6* 8.7* 8.5*  MG 2.2  --   --   --   --   PHOS 3.6  --   --   --   --    Liver Function Tests: Recent Labs  Lab 07/05/18 1122  AST 18  ALT 17  ALKPHOS 88  BILITOT 0.5  PROT 7.1  ALBUMIN 2.5*   No results for input(s): LIPASE, AMYLASE in the last 168 hours. No results for input(s): AMMONIA in the last 168 hours. CBC: Recent Labs  Lab 07/05/18 1122 07/06/18 0658 07/07/18 0224 07/09/18 0318  WBC 12.4* 10.0 10.2 9.8  NEUTROABS 8.8*  --   --   --   HGB 8.9* 8.0* 8.3* 8.1*  HCT 27.6* 24.6* 25.9* 25.1*  MCV 89.0 87.9 88.1 88.7  PLT 364 306 311 299   Cardiac Enzymes: No results for input(s): CKTOTAL, CKMB, CKMBINDEX, TROPONINI in the last 168 hours. BNP: BNP (last 3 results) Recent Labs    06/17/18 1601 06/20/18 0707  BNP 194.2* 156.2*    ProBNP (last 3 results) No results for input(s): PROBNP in the last 8760 hours.  CBG: Recent Labs  Lab 07/08/18 0605 07/08/18 1138 07/08/18 1700 07/08/18 2130 07/09/18 0701  GLUCAP 136* 132* 175* 238* 142*       Signed:  Domenic Polite MD.  [facility] 07/09/2018, 10:20 AM

## 2018-07-10 LAB — NOVEL CORONAVIRUS, NAA (HOSP ORDER, SEND-OUT TO REF LAB; TAT 18-24 HRS): SARS-CoV-2, NAA: NOT DETECTED

## 2018-07-10 LAB — GLUCOSE, CAPILLARY
Glucose-Capillary: 98 mg/dL (ref 70–99)
Glucose-Capillary: 226 mg/dL — ABNORMAL HIGH (ref 70–99)
Glucose-Capillary: 117 mg/dL — ABNORMAL HIGH (ref 70–99)

## 2018-07-10 NOTE — TOC Transition Note (Signed)
Transition of Care [facility]) - CM/SW Discharge Note   Patient Details  Name: [name] MRN: [id number] Date of Birth: February 19, 1940  [facility] CM/SW Contact:  Candie Chroman, LCSW Phone Number: 07/10/2018, 2:05 PM   Clinical Narrative: CSW facilitated patient discharge including contacting patient family and facility to confirm patient discharge plans. Clinical information faxed to facility and family agreeable with plan. CSW arranged ambulance transport via [facility] to [facility] at 3:00. RN to call report prior to discharge ([place]).  CSW will sign off for now as social work intervention is no longer needed. Please consult [place] again if new needs arise.  Final next level of care: Skilled Nursing Facility Barriers to Discharge: Barriers Resolved   Patient Goals and CMS Choice Patient states their goals for this hospitalization and ongoing recovery are:: "I want to get good therapy so I can go home." CMS Medicare.gov Compare Post Acute Care list provided to:: Patient    Discharge Placement   Existing PASRR number confirmed : 07/06/18          Patient chooses bed at: [facility] Patient to be transferred to facility by: [place] Name of family member notified: Nelva Bush Patient and family notified of of transfer: 07/10/18  Discharge Plan and Services     Post Acute Care Choice: [place]                               Social Determinants of Health (SDOH) Interventions     Readmission Risk Interventions Readmission Risk Prevention Plan 07/10/2018 06/18/2018  Transportation Screening - Complete  PCP or Specialist Appt within 3-5 Days Complete Not Complete  Not Complete comments - not yet ready for d/c  HRI or [place] Complete Not Complete  HRI or Home Care Consult comments - CM will follow for needs  Social Work Consult for [place] Planning/Counseling Complete Complete  Palliative Care  Screening Not Applicable Not Applicable  Medication Review Press photographer) - Complete  Some recent data might be hidden

## 2018-07-10 NOTE — Care Management Important Message (Signed)
Important Message  Patient Details  Name: [name] MRN: [id number] Date of Birth: 1940-08-23   Medicare Important Message Given:  Yes     Shelda Altes 07/10/2018, 12:24 PM

## 2018-07-10 NOTE — TOC Progression Note (Addendum)
Transition of Care [facility]) - Progression Note    Patient Details  Name: JAYCELYN ORRISON MRN: [id number] Date of Birth: [birth year]  Transition of Care [facility]) CM/SW Contact  Candie Chroman, LCSW Phone Number: 07/10/2018, 12:12 PM  Clinical Narrative: Patient's COVID test came back negative. Left message for SNF admissions coordinator to check insurance authorization status.  1:08 pm: Insurance authorization approved and air mattress will be delivered. SNF can take patient once discharge summary has been updated with today's date. CSW sent message to MD to notify. RN is aware.  Expected Discharge Plan: Skilled Nursing Facility Barriers to Discharge: Ship broker, Continued Medical Work up  Expected Discharge Plan and Services Expected Discharge Plan: [place] Choice: [place] Living arrangements for the past 2 months: Single Family Home Expected Discharge Date: 07/09/18                                     Social Determinants of Health (SDOH) Interventions    Readmission Risk Interventions Readmission Risk Prevention Plan 06/18/2018  Transportation Screening Complete  PCP or Specialist Appt within 3-5 Days Not Complete  Not Complete comments not yet ready for d/c  HRI or [place] Not Complete  HRI or Home Care Consult comments CM will follow for needs  Social Work Consult for [place] Planning/Counseling Complete  Palliative Care Screening Not Applicable  Medication Review Press photographer) Complete  Some recent data might be hidden

## 2018-07-23 ENCOUNTER — Inpatient Hospital Stay (HOSPITAL_COMMUNITY)
Admission: EM | Admit: 2018-07-23 | Discharge: 2018-08-02 | DRG: 377 | Disposition: A | Discharge status: Skilled Nursing Facility | Payer: Medicare Other | Source: Skilled Nursing Facility | Attending: Internal Medicine | Admitting: Internal Medicine

## 2018-07-23 ENCOUNTER — Emergency Department (HOSPITAL_COMMUNITY): Payer: Medicare Other

## 2018-07-23 ENCOUNTER — Other Ambulatory Visit: Payer: Self-pay

## 2018-07-23 ENCOUNTER — Encounter (HOSPITAL_COMMUNITY): Payer: Self-pay | Admitting: Emergency Medicine

## 2018-07-23 DIAGNOSIS — R195 Other fecal abnormalities: Secondary | ICD-10-CM

## 2018-07-23 DIAGNOSIS — F329 Major depressive disorder, single episode, unspecified: Secondary | ICD-10-CM | POA: Diagnosis present

## 2018-07-23 DIAGNOSIS — J9621 Acute and chronic respiratory failure with hypoxia: Secondary | ICD-10-CM | POA: Diagnosis present

## 2018-07-23 DIAGNOSIS — N189 Chronic kidney disease, unspecified: Secondary | ICD-10-CM | POA: Diagnosis present

## 2018-07-23 DIAGNOSIS — D62 Acute posthemorrhagic anemia: Secondary | ICD-10-CM | POA: Diagnosis present

## 2018-07-23 DIAGNOSIS — N179 Acute kidney failure, unspecified: Secondary | ICD-10-CM | POA: Diagnosis present

## 2018-07-23 DIAGNOSIS — E7800 Pure hypercholesterolemia, unspecified: Secondary | ICD-10-CM | POA: Diagnosis present

## 2018-07-23 DIAGNOSIS — D500 Iron deficiency anemia secondary to blood loss (chronic): Secondary | ICD-10-CM | POA: Diagnosis not present

## 2018-07-23 DIAGNOSIS — L89152 Pressure ulcer of sacral region, stage 2: Secondary | ICD-10-CM | POA: Diagnosis present

## 2018-07-23 DIAGNOSIS — Z6829 Body mass index (BMI) 29.0-29.9, adult: Secondary | ICD-10-CM

## 2018-07-23 DIAGNOSIS — K219 Gastro-esophageal reflux disease without esophagitis: Secondary | ICD-10-CM | POA: Diagnosis present

## 2018-07-23 DIAGNOSIS — I130 Hypertensive heart and chronic kidney disease with heart failure and stage 1 through stage 4 chronic kidney disease, or unspecified chronic kidney disease: Secondary | ICD-10-CM | POA: Diagnosis present

## 2018-07-23 DIAGNOSIS — Z8719 Personal history of other diseases of the digestive system: Secondary | ICD-10-CM

## 2018-07-23 DIAGNOSIS — Z20828 Contact with and (suspected) exposure to other viral communicable diseases: Secondary | ICD-10-CM | POA: Diagnosis present

## 2018-07-23 DIAGNOSIS — Z794 Long term (current) use of insulin: Secondary | ICD-10-CM

## 2018-07-23 DIAGNOSIS — I700 Atherosclerosis of aorta: Secondary | ICD-10-CM

## 2018-07-23 DIAGNOSIS — Z79891 Long term (current) use of opiate analgesic: Secondary | ICD-10-CM

## 2018-07-23 DIAGNOSIS — N184 Chronic kidney disease, stage 4 (severe): Secondary | ICD-10-CM | POA: Diagnosis present

## 2018-07-23 DIAGNOSIS — L89223 Pressure ulcer of left hip, stage 3: Secondary | ICD-10-CM | POA: Diagnosis present

## 2018-07-23 DIAGNOSIS — Z79899 Other long term (current) drug therapy: Secondary | ICD-10-CM

## 2018-07-23 DIAGNOSIS — Z96653 Presence of artificial knee joint, bilateral: Secondary | ICD-10-CM | POA: Diagnosis present

## 2018-07-23 DIAGNOSIS — E669 Obesity, unspecified: Secondary | ICD-10-CM | POA: Diagnosis present

## 2018-07-23 DIAGNOSIS — L89629 Pressure ulcer of left heel, unspecified stage: Secondary | ICD-10-CM | POA: Diagnosis present

## 2018-07-23 DIAGNOSIS — I10 Essential (primary) hypertension: Secondary | ICD-10-CM | POA: Diagnosis present

## 2018-07-23 DIAGNOSIS — Z86010 Personal history of colonic polyps: Secondary | ICD-10-CM

## 2018-07-23 DIAGNOSIS — I872 Venous insufficiency (chronic) (peripheral): Secondary | ICD-10-CM | POA: Diagnosis present

## 2018-07-23 DIAGNOSIS — L89610 Pressure ulcer of right heel, unstageable: Secondary | ICD-10-CM | POA: Diagnosis present

## 2018-07-23 DIAGNOSIS — J449 Chronic obstructive pulmonary disease, unspecified: Secondary | ICD-10-CM | POA: Diagnosis present

## 2018-07-23 DIAGNOSIS — Z803 Family history of malignant neoplasm of breast: Secondary | ICD-10-CM

## 2018-07-23 DIAGNOSIS — D638 Anemia in other chronic diseases classified elsewhere: Secondary | ICD-10-CM

## 2018-07-23 DIAGNOSIS — Z87891 Personal history of nicotine dependence: Secondary | ICD-10-CM

## 2018-07-23 DIAGNOSIS — E119 Type 2 diabetes mellitus without complications: Secondary | ICD-10-CM

## 2018-07-23 DIAGNOSIS — G4733 Obstructive sleep apnea (adult) (pediatric): Secondary | ICD-10-CM | POA: Diagnosis present

## 2018-07-23 DIAGNOSIS — I5043 Acute on chronic combined systolic (congestive) and diastolic (congestive) heart failure: Secondary | ICD-10-CM | POA: Diagnosis present

## 2018-07-23 DIAGNOSIS — K922 Gastrointestinal hemorrhage, unspecified: Secondary | ICD-10-CM | POA: Diagnosis not present

## 2018-07-23 DIAGNOSIS — Z9981 Dependence on supplemental oxygen: Secondary | ICD-10-CM

## 2018-07-23 DIAGNOSIS — E1122 Type 2 diabetes mellitus with diabetic chronic kidney disease: Secondary | ICD-10-CM | POA: Diagnosis present

## 2018-07-23 DIAGNOSIS — I5042 Chronic combined systolic (congestive) and diastolic (congestive) heart failure: Secondary | ICD-10-CM | POA: Diagnosis present

## 2018-07-23 DIAGNOSIS — R0602 Shortness of breath: Secondary | ICD-10-CM

## 2018-07-23 DIAGNOSIS — E785 Hyperlipidemia, unspecified: Secondary | ICD-10-CM | POA: Diagnosis present

## 2018-07-23 DIAGNOSIS — Z8711 Personal history of peptic ulcer disease: Secondary | ICD-10-CM

## 2018-07-23 DIAGNOSIS — L8990 Pressure ulcer of unspecified site, unspecified stage: Secondary | ICD-10-CM | POA: Insufficient documentation

## 2018-07-23 DIAGNOSIS — D649 Anemia, unspecified: Secondary | ICD-10-CM

## 2018-07-23 LAB — COMPREHENSIVE METABOLIC PANEL
ALT: 16 U/L (ref 0–44)
AST: 13 U/L — ABNORMAL LOW (ref 15–41)
Albumin: 2.7 g/dL — ABNORMAL LOW (ref 3.5–5.0)
Alkaline Phosphatase: 76 U/L (ref 38–126)
Anion gap: 9 (ref 5–15)
BUN: 125 mg/dL — ABNORMAL HIGH (ref 8–23)
CO2: 23 mmol/L (ref 22–32)
Calcium: 8.6 mg/dL — ABNORMAL LOW (ref 8.9–10.3)
Chloride: 106 mmol/L (ref 98–111)
Creatinine, Ser: 3.18 mg/dL — ABNORMAL HIGH (ref 0.44–1.00)
GFR calc Af Amer: 16 mL/min — ABNORMAL LOW (ref >60)
GFR calc non Af Amer: 14 mL/min — ABNORMAL LOW (ref >60)
Glucose, Bld: 171 mg/dL — ABNORMAL HIGH (ref 70–99)
Potassium: 4.6 mmol/L (ref 3.5–5.1)
Sodium: 138 mmol/L (ref 135–145)
Total Bilirubin: 0.5 mg/dL (ref 0.3–1.2)
Total Protein: 6.9 g/dL (ref 6.5–8.1)

## 2018-07-23 LAB — URINALYSIS, ROUTINE W REFLEX MICROSCOPIC
Bilirubin Urine: NEGATIVE
Glucose, UA: NEGATIVE mg/dL
Hgb urine dipstick: NEGATIVE
Ketones, ur: NEGATIVE mg/dL
Leukocytes,Ua: NEGATIVE
Nitrite: NEGATIVE
Protein, ur: NEGATIVE mg/dL
Specific Gravity, Urine: 1.011 (ref 1.005–1.030)
pH: 5 (ref 5.0–8.0)

## 2018-07-23 LAB — CBC WITH DIFFERENTIAL/PLATELET
Abs Immature Granulocytes: 0.04 10*3/uL (ref 0.00–0.07)
Basophils Absolute: 0.1 10*3/uL (ref 0.0–0.1)
Basophils Relative: 0 %
Eosinophils Absolute: 2.2 10*3/uL — ABNORMAL HIGH (ref 0.0–0.5)
Eosinophils Relative: 19 %
HCT: 23.8 % — ABNORMAL LOW (ref 36.0–46.0)
Hemoglobin: 7.4 g/dL — ABNORMAL LOW (ref 12.0–15.0)
Immature Granulocytes: 0 %
Lymphocytes Relative: 9 %
Lymphs Abs: 1 10*3/uL (ref 0.7–4.0)
MCH: 28.5 pg (ref 26.0–34.0)
MCHC: 31.1 g/dL (ref 30.0–36.0)
MCV: 91.5 fL (ref 80.0–100.0)
Monocytes Absolute: 0.6 10*3/uL (ref 0.1–1.0)
Monocytes Relative: 5 %
Neutro Abs: 7.7 10*3/uL (ref 1.7–7.7)
Neutrophils Relative %: 67 %
Platelets: 281 10*3/uL (ref 150–400)
RBC: 2.6 MIL/uL — ABNORMAL LOW (ref 3.87–5.11)
RDW: 16.8 % — ABNORMAL HIGH (ref 11.5–15.5)
WBC: 11.6 10*3/uL — ABNORMAL HIGH (ref 4.0–10.5)
nRBC: 0 % (ref 0.0–0.2)

## 2018-07-23 LAB — I-STAT CREATININE, ED: Creatinine, Ser: 3.2 mg/dL — ABNORMAL HIGH (ref 0.44–1.00)

## 2018-07-23 LAB — PREPARE RBC (CROSSMATCH)

## 2018-07-23 LAB — BRAIN NATRIURETIC PEPTIDE: B Natriuretic Peptide: 466.8 pg/mL — ABNORMAL HIGH (ref 0.0–100.0)

## 2018-07-23 LAB — GLUCOSE, CAPILLARY: Glucose-Capillary: 127 mg/dL — ABNORMAL HIGH (ref 70–99)

## 2018-07-23 LAB — POC OCCULT BLOOD, ED: Fecal Occult Bld: POSITIVE — AB

## 2018-07-23 LAB — PROTIME-INR
INR: 1.3 — ABNORMAL HIGH (ref 0.8–1.2)
Prothrombin Time: 15.8 seconds — ABNORMAL HIGH (ref 11.4–15.2)

## 2018-07-23 LAB — SARS CORONAVIRUS 2 BY RT PCR (HOSPITAL ORDER, PERFORMED IN ~~LOC~~ HOSPITAL LAB): SARS Coronavirus 2: NEGATIVE

## 2018-07-23 MED ORDER — ACETAMINOPHEN 650 MG RE SUPP: 650.0000 mg | Freq: Four times a day (QID) | RECTAL | Status: DC | PRN

## 2018-07-23 MED ORDER — VITAMIN B-12 1000 MCG PO TABS
1000.0000 ug | ORAL_TABLET | Freq: Every day | ORAL | Status: DC
  Administered 2018-07-24 – 2018-08-02 (×10): 1000 ug via ORAL
  Filled 2018-07-23 (×10): qty 1

## 2018-07-23 MED ORDER — SENNOSIDES-DOCUSATE SODIUM 8.6-50 MG PO TABS: 1.0000 | ORAL_TABLET | Freq: Every evening | ORAL | Status: DC | PRN

## 2018-07-23 MED ORDER — SODIUM CHLORIDE 0.9 % IV SOLN
8.0000 mg/h | INTRAVENOUS | Status: DC
  Administered 2018-07-23 – 2018-07-24 (×2): 8 mg/h via INTRAVENOUS
  Filled 2018-07-23 (×2): qty 80

## 2018-07-23 MED ORDER — ONDANSETRON HCL 4 MG/2ML IJ SOLN: 4.0000 mg | Freq: Four times a day (QID) | INTRAMUSCULAR | Status: DC | PRN

## 2018-07-23 MED ORDER — ACETAMINOPHEN 325 MG PO TABS
650.0000 mg | ORAL_TABLET | Freq: Four times a day (QID) | ORAL | Status: DC | PRN
  Administered 2018-07-24 – 2018-07-28 (×3): 650 mg via ORAL
  Filled 2018-07-23 (×3): qty 2

## 2018-07-23 MED ORDER — CALCIUM CARBONATE-VITAMIN D 500-200 MG-UNIT PO TABS
1.0000 | ORAL_TABLET | Freq: Every day | ORAL | Status: DC
  Administered 2018-07-24 – 2018-08-02 (×10): 1 via ORAL
  Filled 2018-07-23 (×11): qty 1

## 2018-07-23 MED ORDER — MONTELUKAST SODIUM 10 MG PO TABS
10.0000 mg | ORAL_TABLET | Freq: Every day | ORAL | Status: DC
  Administered 2018-07-24 – 2018-08-01 (×9): 10 mg via ORAL
  Filled 2018-07-23 (×9): qty 1

## 2018-07-23 MED ORDER — SIMVASTATIN 20 MG PO TABS
20.0000 mg | ORAL_TABLET | Freq: Every day | ORAL | Status: DC
  Administered 2018-07-24 – 2018-08-02 (×10): 20 mg via ORAL
  Filled 2018-07-23 (×10): qty 1

## 2018-07-23 MED ORDER — INSULIN GLARGINE 100 UNIT/ML ~~LOC~~ SOLN
20.0000 [IU] | Freq: Every day | SUBCUTANEOUS | Status: DC
  Administered 2018-07-24 – 2018-07-27 (×5): 20 [IU] via SUBCUTANEOUS
  Filled 2018-07-23 (×7): qty 0.2

## 2018-07-23 MED ORDER — AMLODIPINE BESYLATE 10 MG PO TABS
10.0000 mg | ORAL_TABLET | Freq: Every day | ORAL | Status: DC
  Administered 2018-07-24 – 2018-08-02 (×10): 10 mg via ORAL
  Filled 2018-07-23 (×10): qty 1

## 2018-07-23 MED ORDER — CALCITRIOL 0.25 MCG PO CAPS
0.2500 ug | ORAL_CAPSULE | ORAL | Status: DC
  Administered 2018-07-25 – 2018-08-01 (×4): 0.25 ug via ORAL
  Filled 2018-07-23 (×6): qty 1

## 2018-07-23 MED ORDER — ONDANSETRON HCL 4 MG PO TABS
4.0000 mg | ORAL_TABLET | Freq: Four times a day (QID) | ORAL | Status: DC | PRN
  Filled 2018-07-23: qty 1

## 2018-07-23 MED ORDER — SODIUM CHLORIDE 0.9% FLUSH
3.0000 mL | Freq: Two times a day (BID) | INTRAVENOUS | Status: DC
  Administered 2018-07-24 – 2018-07-28 (×6): 3 mL via INTRAVENOUS
  Administered 2018-07-29: 6 mL via INTRAVENOUS
  Administered 2018-07-29 – 2018-08-02 (×8): 3 mL via INTRAVENOUS

## 2018-07-23 MED ORDER — INSULIN ASPART 100 UNIT/ML ~~LOC~~ SOLN
0.0000 [IU] | Freq: Three times a day (TID) | SUBCUTANEOUS | Status: DC
  Administered 2018-07-24 – 2018-07-25 (×2): 2 [IU] via SUBCUTANEOUS
  Administered 2018-07-26 – 2018-07-28 (×4): 1 [IU] via SUBCUTANEOUS
  Administered 2018-07-28: 3 [IU] via SUBCUTANEOUS
  Administered 2018-07-29: 2 [IU] via SUBCUTANEOUS
  Administered 2018-07-29: 1 [IU] via SUBCUTANEOUS
  Administered 2018-07-30: 2 [IU] via SUBCUTANEOUS
  Administered 2018-07-30 – 2018-07-31 (×3): 1 [IU] via SUBCUTANEOUS
  Administered 2018-07-31 – 2018-08-02 (×4): 2 [IU] via SUBCUTANEOUS

## 2018-07-23 MED ORDER — SODIUM CHLORIDE 0.9% IV SOLUTION
Freq: Once | INTRAVENOUS | Status: AC
  Administered 2018-07-24: via INTRAVENOUS

## 2018-07-23 MED ORDER — FOLIC ACID 1 MG PO TABS
0.5000 mg | ORAL_TABLET | Freq: Every day | ORAL | Status: DC
  Administered 2018-07-24 – 2018-08-02 (×10): 0.5 mg via ORAL
  Filled 2018-07-23 (×10): qty 1

## 2018-07-23 MED ORDER — SODIUM CHLORIDE 0.9 % IV SOLN
80.0000 mg | Freq: Once | INTRAVENOUS | Status: AC
  Administered 2018-07-23: 80 mg via INTRAVENOUS
  Filled 2018-07-23: qty 80

## 2018-07-23 NOTE — ED Notes (Signed)
Attempted report x1. 

## 2018-07-23 NOTE — ED Provider Notes (Signed)
[name] is a 78 y.o. female, with a history of asthma, anemia, COPD, DM, GERD, HTN, peptic ulcer, presenting to the ED with reported anemia.   HPI from Langston Masker, PA-C: "Patient is a 78 year old female with a past medical history of type 2 diabetes mellitus, COPD, CKD stage III, hypertension presenting for abnormal lab.  Patient is currently residing at [facility] care after she was discharged from the hospital in late June 2020.  She was being treated for an AKI in the setting of diuresis with torsemide.  She received hospital follow-up labs which showed a hemoglobin of 6.5.  Patient does report that she has a history of requiring blood transfusions due to anemia.  She reports that she has not looked at her stool so she does not know if it has been dark or bloody.  She overall feels well and denies any weakness, dizziness, lightheadedness, shortness of breath, chest pain.  She denies any nausea, vomiting or hematemesis.  She does not take any antiplatelet agents or anticoagulation.  She remembers that she may have had a GI bleed in the past but does not remember when or the circumstances surrounding it.  Additionally, patient is recovering from pneumonia and has a residual cough."  Past Medical History:  Diagnosis Date  . Adenomatous colon polyp   . Anemia   . Arthritis   . Asthma   . COPD (chronic obstructive pulmonary disease) ([place])   . COPD with asthma ([place]) 04/20/2011  . Depression   . Diabetes mellitus   . Diverticulosis   . Gastric ulcer   . GERD (gastroesophageal reflux disease)   . HTN (hypertension)   . Hypercholesterolemia   . Internal hemorrhoids   . Lumbar disc disease   . On home oxygen therapy    "2-3L @ bedtime" (04/04/2017)  . OSA (obstructive sleep apnea) 09/17/2012  . Peptic ulcer disease   . 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 Umbilical hernia   . Valgus foot    right     Physical Exam  BP (!) 154/62 (BP Location: Right Arm)   Pulse 78    Temp (!) 97.5 F (36.4 C)   Resp (!) 22   SpO2 100%   Physical Exam Vitals signs and nursing note reviewed.  Constitutional:      General: She is not in acute distress.    Appearance: She is well-developed. She is not diaphoretic.  HENT:     Head: Normocephalic and atraumatic.     Mouth/Throat:     Mouth: Mucous membranes are moist.     Pharynx: Oropharynx is clear.  Eyes:     Conjunctiva/sclera: Conjunctivae normal.  Neck:     Musculoskeletal: Neck supple.  Cardiovascular:     Rate and Rhythm: Normal rate and regular rhythm.     Pulses: Normal pulses.          Radial pulses are 2+ on the right side and 2+ on the left side.       Posterior tibial pulses are 2+ on the right side and 2+ on the left side.     Heart sounds: Normal heart sounds.     Comments: Tactile temperature in the extremities appropriate and equal bilaterally. Pulmonary:     Effort: Pulmonary effort is normal. No respiratory distress.     Breath sounds: Wheezing present.     Comments: On home O2 of 2lpm, SPO2 100%. No increased work of breathing.  Abdominal:  Palpations: Abdomen is soft.     Tenderness: There is no abdominal tenderness. There is no guarding.  Musculoskeletal:     Right lower leg: Edema present.     Left lower leg: Edema present.     Comments: Chronic appearing bilateral LE edema  Lymphadenopathy:     Cervical: No cervical adenopathy.  Skin:    General: Skin is warm and dry.  Neurological:     Mental Status: She is alert.  Psychiatric:        Mood and Affect: Mood and affect normal.        Speech: Speech normal.        Behavior: Behavior normal.     ED Course/Procedures     Procedures   Abnormal Labs Reviewed  COMPREHENSIVE METABOLIC PANEL - Abnormal; Notable for the following components:      Result Value   Glucose, Bld 171 (*)    BUN 125 (*)    Creatinine, Ser 3.18 (*)    Calcium 8.6 (*)    Albumin 2.7 (*)    AST 13 (*)    GFR calc non Af Amer 14 (*)    GFR  calc Af Amer 16 (*)    All other components within normal limits  CBC WITH DIFFERENTIAL/PLATELET - Abnormal; Notable for the following components:   WBC 11.6 (*)    RBC 2.60 (*)    Hemoglobin 7.4 (*)    HCT 23.8 (*)    RDW 16.8 (*)    Eosinophils Absolute 2.2 (*)    All other components within normal limits  PROTIME-INR - Abnormal; Notable for the following components:   Prothrombin Time 15.8 (*)    INR 1.3 (*)    All other components within normal limits  URINALYSIS, ROUTINE W REFLEX MICROSCOPIC - Abnormal; Notable for the following components:   Color, Urine STRAW (*)    All other components within normal limits  I-STAT CREATININE, ED - Abnormal; Notable for the following components:   Creatinine, Ser 3.20 (*)    All other components within normal limits  POC OCCULT BLOOD, ED - Abnormal; Notable for the following components:   Fecal Occult Bld POSITIVE (*)    All other components within normal limits   Hemoglobin  Date Value Ref Range Status  07/23/2018 7.4 (L) 12.0 - 15.0 g/dL Final  07/09/2018 8.1 (L) 12.0 - 15.0 g/dL Final  07/07/2018 8.3 (L) 12.0 - 15.0 g/dL Final  07/06/2018 8.0 (L) 12.0 - 15.0 g/dL Final   BUN  Date Value Ref Range Status  07/23/2018 125 (H) 8 - 23 mg/dL Final  07/09/2018 84 (H) 8 - 23 mg/dL Final  07/08/2018 90 (H) 8 - 23 mg/dL Final  07/07/2018 103 (H) 8 - 23 mg/dL Final   Creat  Date Value Ref Range Status  10/23/2013 1.62 (H) 0.50 - 1.10 mg/dL Final  10/15/2012 1.55 (H) 0.50 - 1.10 mg/dL Final  09/20/2011 1.56 (H) 0.50 - 1.10 mg/dL Final   Creatinine, Ser  Date Value Ref Range Status  07/23/2018 3.20 (H) 0.44 - 1.00 mg/dL Final  07/23/2018 3.18 (H) 0.44 - 1.00 mg/dL Final  07/09/2018 2.47 (H) 0.44 - 1.00 mg/dL Final  07/08/2018 2.34 (H) 0.44 - 1.00 mg/dL Final       MDM    Clinical Course as of Jul 23 1847  Mon Jul 23, 2018  1533 Fecal Occult Blood, POC(!): POSITIVE [AM]  0981 Spoke with Dr. Posey Pronto, hospitalist. Agrees to admit  the patient.    [  SJ]  1830 Spoke with Dr. Tarri Glenn, [facility].  She will consult on the patient and someone from their team will see the patient in the morning.   [SJ]    Clinical Course User Index [AM] Albesa Seen, PA-C [SJ] Layla Maw    Took patient care handoff report from [place], [place]. Plan: Lab results pending.  Expect to admit patient for GI bleed.  Patient arrives due to reported hemoglobin of 6.5 at rehab facility.  No anticoagulation.  Recheck here is 7.4, however, patient does have some evidence that she may be hemoconcentrated with bump in BUN and creatinine, also indicating recurrence of her AKI, for which she was recently admitted June 25.  Melena on rectal exam and Hemoccult positive.  Benign abdominal exam.  Patient is nontoxic appearing, afebrile, not tachycardic, not tachypneic, not hypotensive, maintains excellent SPO2 on home O2, and is in no apparent distress.  She had a remote history of GI bleed in 2014, for which she was evaluated by [facility].  Patient was given Protonix due to suspected upper GI bleed based on presence of melena.  Source may be peptic ulcer, of which she has a history.  Findings and plan of care discussed with Shirlyn Goltz, MD. Dr. Darl Householder personally evaluated and examined this patient.  Vitals:   07/23/18 1500 07/23/18 1515 07/23/18 1530 07/23/18 1619  BP: (!) 156/72 (!) 158/69 (!) 143/68 (!) 144/58  Pulse:   71 68  Resp: (!) 23 (!) 26 (!) 28 19  Temp:      SpO2:   100% 100%        Layla Maw 07/23/18 1849    Drenda Freeze, MD 07/25/18 [id number]

## 2018-07-23 NOTE — H&P (Signed)
History and Physical    [name] IRW:[id number] DOB: Sep 27, 1940 DOA: 07/23/2018  PCP: Burnard Bunting, MD  Patient coming from: [facility] [facility]  I have personally briefly reviewed patient's old medical records in [place]  Chief Complaint: Anemia  HPI: [name] is a 78 y.o. female with medical history significant for COPD on 2-3 L nightly and as needed, CKD stage IV, chronic combined systolic and diastolic CHF, history of peptic ulcer disease, insulin-dependent type 2 diabetes, hypertension, hyperlipidemia, and OSA not on CPAP (uses O2 via [place] at night) who is brought to the ED from [facility] center for evaluation of anemia.  Patient was recently admitted from 07/05/2018-07/10/2018 for acute on chronic stage IV kidney injury secondary to overdiuresis.  She was previously on torsemide 50 mg twice daily.  Diuretics were held and she was hydrated with improvement in renal function with creatinine 2.4 on discharge.  Her torsemide was reduced to 20 mg twice daily on discharge.  She was discharged to [place] care center and on routine labs she was noted to have a drop in hemoglobin to 6.5.  She reports intermittent dark appearing stools without other obvious bleeding.  She otherwise denies any chest pain, dyspnea, lightheadedness, dizziness, dysuria, abdominal pain, or change in her chronic lower extremity edema.  ED Course:  Initial vitals showed BP 156/72, pulse 78, RR 23, temp 97.5 Fahrenheit, SPO2 100% on 3 L supplemental O2 via [place].  Labs are notable for WBC 11.6, hemoglobin 7.4, platelets 281,000, BUN 125, creatinine 3.18, GFR 16, Potassium 5.6, bicarb 23, urinalysis negative for UTI.  FOBT was positive.  Portable chest x-ray showed cardiomegaly without focal consolidation or obvious effusion.  Patient was started on IV Protonix drip.  GI were consulted and will see in a.m.  The hospitalist service was consulted to admit for further evaluation and  management.  Review of Systems: All systems reviewed and are negative except as documented in history of present illness above.   Past Medical History:  Diagnosis Date  . Adenomatous colon polyp   . Anemia   . Arthritis   . Asthma   . COPD (chronic obstructive pulmonary disease) ([place])   . COPD with asthma ([place]) 04/20/2011  . Depression   . Diabetes mellitus   . Diverticulosis   . Gastric ulcer   . GERD (gastroesophageal reflux disease)   . HTN (hypertension)   . Hypercholesterolemia   . Internal hemorrhoids   . Lumbar disc disease   . On home oxygen therapy    "2-3L @ bedtime" (04/04/2017)  . OSA (obstructive sleep apnea) 09/17/2012  . Peptic ulcer disease   . 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 Umbilical hernia   . Valgus foot    right     Past Surgical History:  Procedure Laterality Date  . COLONOSCOPY  2209 or 2010    2001, 2005, 2009  colonoscopy.  [place]  . COLONOSCOPY N/A 08/14/2012   Procedure: COLONOSCOPY;  Surgeon: Ladene Artist, MD;  Location: [facility] ENDOSCOPY;  Service: Endoscopy;  Laterality: N/A;  . INCISE AND DRAIN ABCESS     Abdominal Wall  . LIPOMA RESECTION     Right Neck  . TOTAL KNEE ARTHROPLASTY     Bilateral  . UMBILICAL HERNIA REPAIR  2007   incarcerated  . UPPER GASTROINTESTINAL ENDOSCOPY  2001   medoff Grade A esophagitis    Social History:  reports that she quit smoking about 60  years ago. Her smoking use included cigarettes. She has a 10.00 pack-year smoking history. She has never used smokeless tobacco. She reports that she does not drink alcohol or use drugs.  Allergies  Allergen Reactions  . Other Other (See Comments)    "all generics make her sick"    Family History  Problem Relation Age of Onset  . Breast cancer Maternal Aunt      Prior to Admission medications   Medication Sig Start Date End Date Taking? Authorizing Provider  acetaminophen (TYLENOL) 325 MG tablet Take 650 mg by mouth every 6 (six) hours as  needed for mild pain.   Yes [provider]  Amino Acids-Protein Hydrolys (FEEDING SUPPLEMENT, PRO-STAT SUGAR FREE 64,) LIQD Take 30 mLs by mouth 2 (two) times daily. 06/20/18  Yes [place] Eric J, DO  amLODipine (NORVASC) 10 MG tablet Take 10 mg by mouth daily.    Yes [provider]  calcitRIOL (ROCALTROL) 0.25 MCG capsule Take 1 capsule (0.25 mcg total) by mouth every Monday, Wednesday, and Friday. 12/05/16  Yes Velvet Bathe, MD  Calcium Carbonate-Vitamin D (CALTRATE 600+D) 600-400 MG-UNIT tablet Take 1 tablet by mouth daily.    Yes [provider]  collagenase (SANTYL) ointment Apply 1 application topically daily.   Yes [provider]  cyclobenzaprine (FLEXERIL) 10 MG tablet Take 10 mg 3 (three) times daily as needed by mouth for muscle spasms.   Yes [provider]  folic acid (FOLVITE) 710 MCG tablet Take 400 mcg by mouth daily.   Yes [provider]  Insulin Aspart (NOVOLOG FLEXPEN [place]) Inject 0-10 Units into the skin See admin instructions. Per Sliding Scale ar 0600, 1100, 1600, 2100 100-150 0units 151-200 2units 201-250 4units 251-300 6units 301-350 8units 351-400 10units   Yes [provider]  insulin aspart (NOVOLOG) 100 UNIT/ML injection Inject 6 Units into the skin 3 (three) times daily with meals. 06/20/18  Yes [place] Eric J, DO  insulin glargine (LANTUS) 100 UNIT/ML injection Inject 0.3 mLs (30 Units total) into the skin at bedtime. Patient taking differently: Inject 30 Units into the skin daily.  06/20/18  Yes [place] Donnamarie Poag, DO  iron polysaccharides (NIFEREX) 150 MG capsule Take 150 mg by mouth 2 (two) times daily.   Yes [provider]  magnesium oxide (MAG-OX) 400 MG tablet Take 400 mg by mouth daily.   Yes [provider]  montelukast (SINGULAIR) 10 MG tablet Take 1 tablet (10 mg total) by mouth at bedtime. 10/05/16  Yes Chesley Mires, MD  nitroGLYCERIN (NITROSTAT) 0.4 MG SL tablet Place 0.4 mg  under the tongue every 5 (five) minutes as needed for chest pain.   Yes [provider]  Omega-3 Fatty Acids (FISH OIL) 1000 MG CAPS Take 1,000 mg by mouth daily.    Yes [provider]  oxyCODONE-acetaminophen (PERCOCET/ROXICET) 5-325 MG tablet Take 1 tablet by mouth every 6 (six) hours as needed for severe pain.   Yes [provider]  Propylene Glycol (SYSTANE BALANCE) 0.6 % SOLN Apply 1 drop to eye 2 (two) times daily as needed (dry eyes).    Yes [provider]  pyridOXINE (VITAMIN B-6) 100 MG tablet Take 100 mg by mouth daily.   Yes [provider]  torsemide (DEMADEX) 10 MG tablet Take 2 tablets (20 mg total) by mouth 2 (two) times daily. 07/09/18  Yes Domenic Polite, MD  traZODone (DESYREL) 50 MG tablet Take 50 mg at bedtime as needed by mouth for sleep.  Yes [provider]  vitamin B-12 (CYANOCOBALAMIN) 1000 MCG tablet Take 1,000 mcg by mouth daily.   Yes [provider]  Vitamin D, Ergocalciferol, (DRISDOL) 50000 UNITS CAPS capsule Take 50,000 Units by mouth 2 (two) times a week. MON and THUR 04/07/14  Yes [provider]  vitamin E 400 UNIT capsule Take 400 Units by mouth daily.   Yes [provider]  ZOCOR 20 MG tablet Take 20 mg by mouth daily. 03/16/14  Yes [provider]  ipratropium-albuterol (DUONEB) 0.5-2.5 (3) MG/3ML SOLN Take 3 mLs by nebulization every 6 (six) hours as needed. Patient not taking: Reported on 07/23/2018 10/05/16   Chesley Mires, MD  levofloxacin (LEVAQUIN) 500 MG tablet Take 500 mg by mouth daily.    [provider]    Physical Exam: Vitals:   07/23/18 1530 07/23/18 1619 07/23/18 1700 07/23/18 1715  BP: (!) 143/68 (!) 144/58 (!) 150/65 (!) 142/65  Pulse: 71 68 69 75  Resp: (!) 28 19 (!) 24 (!) 22  Temp:      SpO2: 100% 100% 100% 100%    Constitutional: Chronically ill-appearing woman resting supine in bed, NAD, calm, comfortable Eyes: PERRL, lids and  conjunctivae normal ENMT: Mucous membranes are dry. Posterior pharynx clear of any exudate or lesions.Normal dentition.  Neck: normal, supple, no masses. Respiratory: clear to auscultation anteriorly, no wheezing, no crackles. Normal respiratory effort. No accessory muscle use.  Cardiovascular: Regular rate and rhythm, soft systolic murmur present.  +2 pitting edema both legs with venous stasis dermatitis changes.  Abdomen: no tenderness, no masses palpated. No hepatosplenomegaly. Bowel sounds positive.  Musculoskeletal: no clubbing / cyanosis. No joint deformity upper and lower extremities.  Skin: Stasis dermatitis of both lower extremities with woody indurated appearance.  Bilateral heels are wrapped. Neurologic: CN 2-12 grossly intact. Sensation intact, strength both upper extremities intact, limited in both lower extremities due to chronic edema. Psychiatric: Normal judgment and insight. Alert and oriented x 3. Normal mood.    Labs on Admission: I have personally reviewed following labs and imaging studies  CBC: Recent Labs  Lab 07/23/18 1521  WBC 11.6*  NEUTROABS 7.7  HGB 7.4*  HCT 23.8*  MCV 91.5  PLT 176   Basic Metabolic Panel: Recent Labs  Lab 07/23/18 1521 07/23/18 1540  NA 138  --   K 4.6  --   CL 106  --   CO2 23  --   GLUCOSE 171*  --   BUN 125*  --   CREATININE 3.18* 3.20*  CALCIUM 8.6*  --    GFR: Estimated Creatinine Clearance: 15 mL/min (A) (by C-G formula based on SCr of 3.2 mg/dL (H)). Liver Function Tests: Recent Labs  Lab 07/23/18 1521  AST 13*  ALT 16  ALKPHOS 76  BILITOT 0.5  PROT 6.9  ALBUMIN 2.7*   No results for input(s): LIPASE, AMYLASE in the last 168 hours. No results for input(s): AMMONIA in the last 168 hours. Coagulation Profile: Recent Labs  Lab 07/23/18 1609  INR 1.3*   Cardiac Enzymes: No results for input(s): CKTOTAL, CKMB, CKMBINDEX, TROPONINI in the last 168 hours. BNP (last 3 results) No results for input(s): PROBNP  in the last 8760 hours. HbA1C: No results for input(s): HGBA1C in the last 72 hours. CBG: No results for input(s): GLUCAP in the last 168 hours. Lipid Profile: No results for input(s): CHOL, HDL, LDLCALC, TRIG, CHOLHDL, LDLDIRECT in the last 72 hours. Thyroid Function Tests: No results for input(s): TSH, T4TOTAL,  FREET4, T3FREE, THYROIDAB in the last 72 hours. Anemia Panel: No results for input(s): VITAMINB12, FOLATE, FERRITIN, TIBC, IRON, RETICCTPCT in the last 72 hours. Urine analysis:    Component Value Date/Time   COLORURINE STRAW (A) 07/23/2018 1656   APPEARANCEUR CLEAR 07/23/2018 1656   LABSPEC 1.011 07/23/2018 1656   PHURINE 5.0 07/23/2018 1656   GLUCOSEU NEGATIVE 07/23/2018 1656   HGBUR NEGATIVE 07/23/2018 1656   BILIRUBINUR NEGATIVE 07/23/2018 1656   KETONESUR NEGATIVE 07/23/2018 1656   PROTEINUR NEGATIVE 07/23/2018 1656   UROBILINOGEN 0.2 12/16/2011 1717   NITRITE NEGATIVE 07/23/2018 1656   LEUKOCYTESUR NEGATIVE 07/23/2018 1656    Radiological Exams on Admission: Dg Chest Portable 1 View  Result Date: 07/23/2018 CLINICAL DATA:  Chronic cough. EXAM: PORTABLE CHEST 1 VIEW COMPARISON:  Radiograph of July 05, 2018. FINDINGS: Stable cardiomegaly. Atherosclerosis of thoracic aorta is noted. No pneumothorax or pleural effusion is noted. Both lungs are clear. The visualized skeletal structures are unremarkable. IMPRESSION: No active disease. Aortic Atherosclerosis (ICD10-I70.0). Electronically Signed   By: Marijo Conception M.D.   On: 07/23/2018 15:34     EKG: Independently reviewed.  Sinus rhythm with PAC, PAC new when compared to prior.  Assessment/Plan Principal Problem:   Anemia due to GI blood loss Active Problems:   Chronic obstructive pulmonary disease (HCC)   CKD (chronic kidney disease) stage 4, GFR 15-29 ml/min (HCC)   Type 2 diabetes mellitus (HCC)   Acute on chronic renal failure (HCC)   Essential hypertension   CHF (congestive heart failure), NYHA class IV,  chronic, combined ([place])  Kaiana M Eakle is a 78 y.o. female with medical history significant for COPD on 2-3 L nightly and as needed, CKD stage IV, chronic combined systolic and diastolic CHF, history of peptic ulcer disease, insulin-dependent type 2 diabetes, hypertension, hyperlipidemia, and OSA not on CPAP (uses O2 via [place] at night) who is admitted for evaluation of anemia.   Anemia due to suspected upper GI blood loss: Hemoglobin 6.5 at outside facility, actually improved to 7.4 on repeat in the ED.  FOBT is positive, possible slow upper GI bleeding for which she seems compensated.  Reported history of peptic ulcer disease.  Last EGD on file 07/28/1999 showed erosive esophagitis.  She is not on blood thinners or taking NSAIDs. -Continue IV Protonix drip for now -Will transfuse 1 unit PRBC for goal hemoglobin 8.0 due to cardiac history -Repeat CBC in a.m.  Acute on chronic stage IV kidney injury: Suspect due to to hypervolemia.  Will trial IV Lasix 40 mg once followed by resumption of home torsemide 20 mg twice daily in a.m.  Follow-up renal function and adjust as needed.  Chronic combined systolic and diastolic CHF: Appears volume overloaded on admission as above.  Will give IV Lasix 40 mg x 1.  Plan to resume torsemide 20 mg twice daily in a.m., adjust based on clinical progress.  COPD: Currently stable without active wheezing.   -Continue supplemental oxygen as needed -Continue Singulair and as needed albuterol  Insulin-dependent type 2 diabetes: -Reduced home Lantus 20 units nightly plus SSI with meals  Hypertension: -Continue amlodipine and torsemide  Chronic sacral decubitus ulcer and heel ulcers: -Consult wound care   DVT prophylaxis: SCDs Code Status: Full code, confirmed with patient Family Communication: None available on admission Disposition Plan: Pending clinical progress Consults called: GI to see in a.m. Admission status: Observation   Zada Finders MD [facility]  If 7PM-7AM, please contact night-coverage [email or web address]  07/23/2018, 6:31  PM    

## 2018-07-23 NOTE — ED Notes (Signed)
ED TO INPATIENT HANDOFF REPORT  ED Nurse Name and Phone #: Annie Main [phone]  S Name/Age/Gender [name] 78 y.o. female Room/Bed: 034C/034C  Code Status   Code Status: Prior  Home/SNF/Other Home Patient oriented to: self, place, time and situation Is this baseline? Yes   Triage Complete: Triage complete  Chief Complaint ANEMIA  Triage Note Per PTAR, pt from [facility] after having blood work done that revealed hgb 6.5. Pt wears 3L [place] at all times due to copd. VSS. Axox4.   Allergies Allergies  Allergen Reactions  . Other Other (See Comments)    "all generics make her sick"    Level of Care/Admitting Diagnosis ED Disposition    ED Disposition Condition [place]: [place] [[id number]]  Level of Care: Telemetry Medical [104]  I expect the patient will be discharged within 24 hours: No (not a candidate for 5C-Observation unit)  Covid Evaluation: Confirmed COVID Negative  Diagnosis: Anemia due to GI blood loss [380631]  Admitting Physician: Lenore Cordia [[id number]]  Attending Physician: Lenore Cordia [[id number]]  PT Class (Do Not Modify): Observation [104]  PT Acc Code (Do Not Modify): Observation [10022]       B Medical/Surgery History Past Medical History:  Diagnosis Date  . Adenomatous colon polyp   . Anemia   . Arthritis   . Asthma   . COPD (chronic obstructive pulmonary disease) ([place])   . 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 Depression   . Diabetes mellitus   . Diverticulosis   . Gastric ulcer   . GERD (gastroesophageal reflux disease)   . HTN (hypertension)   . Hypercholesterolemia   . Internal hemorrhoids   . Lumbar disc disease   . On home oxygen therapy    "2-3L @ bedtime" (04/04/2017)  . OSA (obstructive sleep apnea) 09/17/2012  . Peptic ulcer disease   . 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 Umbilical hernia   . Valgus foot    right    Past Surgical History:  Procedure Laterality  Date  . COLONOSCOPY  2209 or 2010    2001, 2005, 2009  colonoscopy.  [place]  . COLONOSCOPY N/A 08/14/2012   Procedure: COLONOSCOPY;  Surgeon: Ladene Artist, MD;  [facility] ENDOSCOPY;  Service: Endoscopy;  Laterality: N/A;  . INCISE AND DRAIN ABCESS     Abdominal Wall  . LIPOMA RESECTION     Right Neck  . TOTAL KNEE ARTHROPLASTY     Bilateral  . UMBILICAL HERNIA REPAIR  2007   incarcerated  . UPPER GASTROINTESTINAL ENDOSCOPY  2001   medoff Grade A esophagitis     A IV Location/Drains/Wounds Patient Lines/Drains/Airways Status   Active Line/Drains/Airways    Name:   Placement date:   Placement time:   Site:   Days:   Peripheral IV 07/23/18 Left Antecubital   07/23/18    1553    Antecubital   less than 1   Peripheral IV 07/23/18 Right Forearm   07/23/18    1616    Forearm   less than 1   External Urinary Catheter   07/10/18    1445    -   13   Pressure Injury 07/05/18 Sacrum Mid Stage II -  Partial thickness loss of dermis presenting as a shallow open ulcer with a red, pink wound bed without slough. red with granulation tissue   07/05/18    1519  18   Pressure Injury 07/05/18 Heel Right Deep Tissue Injury - Purple or maroon localized area of discolored intact skin or blood-filled blister due to damage of underlying soft tissue from pressure and/or shear. 40% black eschar with 60% red granulation tissu   07/05/18    1519     18   Pressure Injury 07/05/18 Heel Left Deep Tissue Injury - Purple or maroon localized area of discolored intact skin or blood-filled blister due to damage of underlying soft tissue from pressure and/or shear. Small area of black eschar present. 90+% red gra   07/05/18    1519     18   Pressure Injury 07/05/18 Buttocks Left Stage III -  Full thickness tissue loss. Subcutaneous fat may be visible but bone, tendon or muscle are NOT exposed. 100% red tissue   07/05/18    1519     18   Pressure Injury 07/05/18 Buttocks Right Stage II -  Partial thickness loss of  dermis presenting as a shallow open ulcer with a red, pink wound bed without slough. 100% red tissue   07/05/18    1519     18   Pressure Injury 07/05/18 Thigh Left;Posterior Unstageable - Full thickness tissue loss in which the base of the ulcer is covered by slough (yellow, tan, gray, green or brown) and/or eschar (tan, brown or black) in the wound bed. 50/50 red and black tissu   07/05/18    1519     18   Pressure Injury 07/05/18 Tibial Left;Posterior Stage II -  Partial thickness loss of dermis presenting as a shallow open ulcer with a red, pink wound bed without slough. 20% eschair and 80% red tissue 07/05/18 WOC assessment this is not pressure;it is ven   07/05/18    1519     18   Pressure Injury 07/06/18 Foot Anterior;Left;Lateral Deep Tissue Injury - Purple or maroon localized area of discolored intact skin or blood-filled blister due to damage of underlying soft tissue from pressure and/or shear.   07/06/18    1039     17   Pressure Injury 07/06/18 Toe (Comment  which one) Left Deep Tissue Injury - Purple or maroon localized area of discolored intact skin or blood-filled blister due to damage of underlying soft tissue from pressure and/or shear.   07/06/18    1040     17          Intake/Output Last 24 hours No intake or output data in the 24 hours ending 07/23/18 1855  Labs/Imaging Results for orders placed or performed during the hospital encounter of 07/23/18 (from the past 48 hour(s))  POC occult blood, ED     Status: Abnormal   Collection Time: 07/23/18  3:17 PM  Result Value Ref Range   Fecal Occult Bld POSITIVE (A) NEGATIVE  Comprehensive metabolic panel     Status: Abnormal   Collection Time: 07/23/18  3:21 PM  Result Value Ref Range   Sodium 138 135 - 145 mmol/L   Potassium 4.6 3.5 - 5.1 mmol/L   Chloride 106 98 - 111 mmol/L   CO2 23 22 - 32 mmol/L   Glucose, Bld 171 (H) 70 - 99 mg/dL   BUN 125 (H) 8 - 23 mg/dL   Creatinine, Ser 3.18 (H) 0.44 - 1.00 mg/dL   Calcium 8.6  (L) 8.9 - 10.3 mg/dL   Total Protein 6.9 6.5 - 8.1 g/dL   Albumin 2.7 (L) 3.5 - 5.0 g/dL   AST  13 (L) 15 - 41 U/L   ALT 16 0 - 44 U/L   Alkaline Phosphatase 76 38 - 126 U/L   Total Bilirubin 0.5 0.3 - 1.2 mg/dL   GFR calc non Af Amer 14 (L) >60 mL/min   GFR calc Af Amer 16 (L) >60 mL/min   Anion gap 9 5 - 15    Comment: Performed at [facility] [address], [place], [place] [address]  CBC with Differential     Status: Abnormal   Collection Time: 07/23/18  3:21 PM  Result Value Ref Range   WBC 11.6 (H) 4.0 - 10.5 K/uL   RBC 2.60 (L) 3.87 - 5.11 MIL/uL   Hemoglobin 7.4 (L) 12.0 - 15.0 g/dL   HCT 23.8 (L) 36.0 - 46.0 %   MCV 91.5 80.0 - 100.0 fL   MCH 28.5 26.0 - 34.0 pg   MCHC 31.1 30.0 - 36.0 g/dL   RDW 16.8 (H) 11.5 - 15.5 %   Platelets 281 150 - 400 K/uL   nRBC 0.0 0.0 - 0.2 %   Neutrophils Relative % 67 %   Neutro Abs 7.7 1.7 - 7.7 K/uL   Lymphocytes Relative 9 %   Lymphs Abs 1.0 0.7 - 4.0 K/uL   Monocytes Relative 5 %   Monocytes Absolute 0.6 0.1 - 1.0 K/uL   Eosinophils Relative 19 %   Eosinophils Absolute 2.2 (H) 0.0 - 0.5 K/uL   Basophils Relative 0 %   Basophils Absolute 0.1 0.0 - 0.1 K/uL   Immature Granulocytes 0 %   Abs Immature Granulocytes 0.04 0.00 - 0.07 K/uL   Polychromasia PRESENT     Comment: Performed at [facility], [place] [address] [place], [place] [address]  Type and screen [place]     Status: None   Collection Time: 07/23/18  3:37 PM  Result Value Ref Range   ABO/RH(D) B POS    Antibody Screen NEG    Sample Expiration      07/26/2018,2359 Performed at [facility], [place] [address] [place], [place] [address]   I-Stat Creatinine, ED (not at [facility])     Status: Abnormal   Collection Time: 07/23/18  3:40 PM  Result Value Ref Range   Creatinine, Ser 3.20 (H) 0.44 - 1.00 mg/dL  Protime-INR     Status: Abnormal   Collection Time: 07/23/18  4:09 PM  Result Value Ref Range   Prothrombin Time 15.8 (H) 11.4 -  15.2 seconds   INR 1.3 (H) 0.8 - 1.2    Comment: (NOTE) INR goal varies based on device and disease states. Performed at [facility], [place] [address], [place],  [address]   SARS Coronavirus 2 (CEPHEID - Performed in [facility] hospital lab), Hosp Order     Status: None   Collection Time: 07/23/18  4:50 PM   Specimen: Urine, Clean Catch; Nasopharyngeal  Result Value Ref Range   SARS Coronavirus 2 NEGATIVE NEGATIVE    Comment: (NOTE) If result is NEGATIVE SARS-CoV-2 target nucleic acids are NOT DETECTED. The SARS-CoV-2 RNA is generally detectable in upper and lower  respiratory specimens during the acute phase of infection. The lowest  concentration of SARS-CoV-2 viral copies this assay can detect is 250  copies / mL. A negative result does not preclude SARS-CoV-2 infection  and should not be used as the sole basis for treatment or other  patient management decisions.  A negative result may occur  with  improper specimen collection / handling, submission of specimen other  than nasopharyngeal swab, presence of viral mutation(s) within the  areas targeted by this assay, and inadequate number of viral copies  (<250 copies / mL). A negative result must be combined with clinical  observations, patient history, and epidemiological information. If result is POSITIVE SARS-CoV-2 target nucleic acids are DETECTED. The SARS-CoV-2 RNA is generally detectable in upper and lower  respiratory specimens dur ing the acute phase of infection.  Positive  results are indicative of active infection with SARS-CoV-2.  Clinical  correlation with patient history and other diagnostic information is  necessary to determine patient infection status.  Positive results do  not rule out bacterial infection or co-infection with other viruses. If result is PRESUMPTIVE POSTIVE SARS-CoV-2 nucleic acids MAY BE PRESENT.   A presumptive positive result was obtained on the submitted specimen  and  confirmed on repeat testing.  While 2019 novel coronavirus  (SARS-CoV-2) nucleic acids may be present in the submitted sample  additional confirmatory testing may be necessary for epidemiological  and / or clinical management purposes  to differentiate between  SARS-CoV-2 and other Sarbecovirus currently known to infect humans.  If clinically indicated additional testing with an alternate test  methodology [phone]) is advised. The SARS-CoV-2 RNA is generally  detectable in upper and lower respiratory sp ecimens during the acute  phase of infection. The expected result is Negative. Fact Sheet for Patients:  StrictlyIdeas.no Fact Sheet for Healthcare Providers: BankingDealers.co.za This test is not yet approved or cleared by the [place] FDA and has been authorized for detection and/or diagnosis of SARS-CoV-2 by FDA under an Emergency Use Authorization (EUA).  This EUA will remain in effect (meaning this test can be used) for the duration of the COVID-19 declaration under Section 564(b)(1) of the Act, 21 U.S.C. section 360bbb-3(b)(1), unless the authorization is terminated or revoked sooner. Performed at [facility], [place] [address], [place], [place] [address]   Urinalysis, Routine w reflex microscopic     Status: Abnormal   Collection Time: 07/23/18  4:56 PM  Result Value Ref Range   Color, Urine STRAW (A) YELLOW   APPearance CLEAR CLEAR   Specific Gravity, Urine 1.011 1.005 - 1.030   pH 5.0 5.0 - 8.0   Glucose, UA NEGATIVE NEGATIVE mg/dL   Hgb urine dipstick NEGATIVE NEGATIVE   Bilirubin Urine NEGATIVE NEGATIVE   Ketones, ur NEGATIVE NEGATIVE mg/dL   Protein, ur NEGATIVE NEGATIVE mg/dL   Nitrite NEGATIVE NEGATIVE   Leukocytes,Ua NEGATIVE NEGATIVE    Comment: Performed at [facility] [address], [place], [place] [address]   Dg Chest Portable 1 View  Result Date: 07/23/2018 CLINICAL DATA:  Chronic cough.  EXAM: PORTABLE CHEST 1 VIEW COMPARISON:  Radiograph of July 05, 2018. FINDINGS: Stable cardiomegaly. Atherosclerosis of thoracic aorta is noted. No pneumothorax or pleural effusion is noted. Both lungs are clear. The visualized skeletal structures are unremarkable. IMPRESSION: No active disease. Aortic Atherosclerosis (ICD10-I70.0). Electronically Signed   By: Marijo Conception M.D.   On: 07/23/2018 15:34    Pending Labs Unresulted Labs (From admission, onward)   None      Vitals/Pain Today's Vitals   07/23/18 1530 07/23/18 1619 07/23/18 1700 07/23/18 1715  BP: (!) 143/68 (!) 144/58 (!) 150/65 (!) 142/65  Pulse: 71 68 69 75  Resp: (!) 28 19 (!) 24 (!) 22  Temp:      SpO2: 100% 100% 100% 100%  Isolation Precautions No active isolations  Medications Medications  pantoprazole (PROTONIX) 80 mg in sodium chloride 0.9 % 250 mL (0.32 mg/mL) infusion (8 mg/hr Intravenous New Bag/Given 07/23/18 1724)  pantoprazole (PROTONIX) 80 mg in sodium chloride 0.9 % 100 mL IVPB (0 mg Intravenous Stopped 07/23/18 1813)    Mobility walks with device High fall risk   Focused Assessments Cardiac Assessment Handoff:    Lab Results  Component Value Date   TROPONINI 0.09 (HH) 06/17/2018   Lab Results  Component Value Date   DDIMER 1.76 (H) 12/16/2011   Does the Patient currently have chest pain? No     R Recommendations: See Admitting Provider Note  Report given to:   Additional Notes:

## 2018-07-23 NOTE — ED Triage Notes (Signed)
Per PTAR, pt from [facility] after having blood work done that revealed hgb 6.5. Pt wears 3L [place] at all times due to copd. VSS. Axox4.

## 2018-07-23 NOTE — ED Notes (Signed)
Attempted report x 2 

## 2018-07-23 NOTE — ED Provider Notes (Signed)
[facility] EMERGENCY DEPARTMENT Provider Note   CSN: [id number] Arrival date & time: 07/23/18  1427     History   Chief Complaint Chief Complaint  Patient presents with  . Anemia    HPI [name] is a 78 y.o. female.     HPI   Patient is a 78 year old female with a past medical history of type 2 diabetes mellitus, COPD, CKD stage III, hypertension presenting for abnormal lab.  Patient is currently residing at [facility] care after she was discharged from the hospital in late June 2020.  She was being treated for an AKI in the setting of diuresis with torsemide.  She received hospital follow-up labs which showed a hemoglobin of 6.5.  Patient does report that she has a history of requiring blood transfusions due to anemia.  She reports that she has not looked at her stool so she does not know if it has been dark or bloody.  She overall feels well and denies any weakness, dizziness, lightheadedness, shortness of breath, chest pain.  She denies any nausea, vomiting or hematemesis.  She does not take any antiplatelet agents or anticoagulation.  She remembers that she may have had a GI bleed in the past but does not remember when or the circumstances surrounding it.  Additionally, patient is recovering from pneumonia and has a residual cough.  Past Medical History:  Diagnosis Date  . Adenomatous colon polyp   . Anemia   . Arthritis   . Asthma   . COPD (chronic obstructive pulmonary disease) ([place])   . COPD with asthma ([place]) 04/20/2011  . Depression   . Diabetes mellitus   . Diverticulosis   . Gastric ulcer   . GERD (gastroesophageal reflux disease)   . HTN (hypertension)   . Hypercholesterolemia   . Internal hemorrhoids   . Lumbar disc disease   . On home oxygen therapy    "2-3L @ bedtime" (04/04/2017)  . OSA (obstructive sleep apnea) 09/17/2012  . Peptic ulcer disease   . 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 Umbilical hernia   . Valgus  foot    right     Patient Active Problem List   Diagnosis Date Noted  . Acute on chronic renal failure ([place]) 07/05/2018  . Essential hypertension 07/05/2018  . CHF (congestive heart failure), NYHA class IV, chronic, combined ([place]) 07/05/2018  . Diabetic hyperosmolar non-ketotic state ([place]) 06/17/2018  . Hyperglycemia 06/16/2018  . Type 2 diabetes mellitus ([place]) 06/16/2018  . UTI (urinary tract infection) 06/16/2018  . Sacral decubitus ulcer 06/16/2018  . Acute respiratory failure with hypoxia ([place]) 04/04/2017  . Hypertensive urgency 04/04/2017  . CKD (chronic kidney disease) stage 4, GFR 15-29 ml/min (HCC) 04/04/2017  . Hypoglycemia   . Acute renal failure superimposed on stage 3 chronic kidney disease ([place]) 11/23/2016  . Metabolic acidosis with normal anion gap and bicarbonate losses 11/23/2016  . Acute exacerbation of CHF (congestive heart failure) ([place]) 11/22/2016  . Acute on chronic respiratory failure with hypoxia ([place]) 11/22/2016  . Midfoot ulcer, left, limited to breakdown of skin ([place]) 06/13/2016  . Idiopathic chronic venous hypertension of both lower extremities with inflammation 03/10/2016  . Onychomycosis 03/10/2016  . Chronic obstructive pulmonary disease ([place]) 11/07/2013  . Upper airway cough syndrome 11/07/2013  . Insomnia 12/10/2012  . OSA (obstructive sleep apnea) 09/17/2012  . Benign neoplasm of colon 08/14/2012  . Rectal bleeding 08/13/2012    Class: Acute  . Anemia 08/13/2012  Class: Acute  . Osteoarthritis 08/13/2012    Class: Chronic  . Acute posthemorrhagic anemia 08/13/2012  . Diverticulosis of colon (without mention of hemorrhage) 08/13/2012  . Hoarseness 07/20/2011  . COPD with asthma ([place]) 04/20/2011  . Allergic rhinitis 04/20/2011  . GI bleed 02/08/2011  . Personal history of colonic polyps 04/16/2010  . Fecal incontinence 03/23/2010  . Change in bowel habits 03/23/2010    Past Surgical History:  Procedure Laterality Date  . COLONOSCOPY   2209 or 2010    2001, 2005, 2009  colonoscopy.  [place]  . COLONOSCOPY N/A 08/14/2012   Procedure: COLONOSCOPY;  Surgeon: Ladene Artist, MD;  Location: [facility] ENDOSCOPY;  Service: Endoscopy;  Laterality: N/A;  . INCISE AND DRAIN ABCESS     Abdominal Wall  . LIPOMA RESECTION     Right Neck  . TOTAL KNEE ARTHROPLASTY     Bilateral  . UMBILICAL HERNIA REPAIR  2007   incarcerated  . UPPER GASTROINTESTINAL ENDOSCOPY  2001   medoff Grade A esophagitis     OB History   No obstetric history on file.      Home Medications    Prior to Admission medications   Medication Sig Start Date End Date Taking? Authorizing Provider  acetaminophen (TYLENOL) 325 MG tablet Take 650 mg by mouth every 6 (six) hours as needed for mild pain.   Yes [provider]  Amino Acids-Protein Hydrolys (FEEDING SUPPLEMENT, PRO-STAT SUGAR FREE 64,) LIQD Take 30 mLs by mouth 2 (two) times daily. 06/20/18  Yes [place] Eric J, DO  amLODipine (NORVASC) 10 MG tablet Take 10 mg by mouth daily.    Yes [provider]  calcitRIOL (ROCALTROL) 0.25 MCG capsule Take 1 capsule (0.25 mcg total) by mouth every Monday, Wednesday, and Friday. 12/05/16  Yes Velvet Bathe, MD  Calcium Carbonate-Vitamin D (CALTRATE 600+D) 600-400 MG-UNIT tablet Take 1 tablet by mouth daily.    Yes [provider]  collagenase (SANTYL) ointment Apply 1 application topically daily.   Yes [provider]  cyclobenzaprine (FLEXERIL) 10 MG tablet Take 10 mg 3 (three) times daily as needed by mouth for muscle spasms.   Yes [provider]  folic acid (FOLVITE) 149 MCG tablet Take 400 mcg by mouth daily.   Yes [provider]  Insulin Aspart (NOVOLOG FLEXPEN [place]) Inject 0-10 Units into the skin See admin instructions. Per Sliding Scale ar 0600, 1100, 1600, 2100 100-150 0units 151-200 2units 201-250 4units 251-300 6units 301-350 8units 351-400 10units   Yes [provider]  insulin aspart (NOVOLOG)  100 UNIT/ML injection Inject 6 Units into the skin 3 (three) times daily with meals. 06/20/18  Yes [place] Eric J, DO  insulin glargine (LANTUS) 100 UNIT/ML injection Inject 0.3 mLs (30 Units total) into the skin at bedtime. Patient taking differently: Inject 30 Units into the skin daily.  06/20/18  Yes [place] Donnamarie Poag, DO  iron polysaccharides (NIFEREX) 150 MG capsule Take 150 mg by mouth 2 (two) times daily.   Yes [provider]  magnesium oxide (MAG-OX) 400 MG tablet Take 400 mg by mouth daily.   Yes [provider]  montelukast (SINGULAIR) 10 MG tablet Take 1 tablet (10 mg total) by mouth at bedtime. 10/05/16  Yes Chesley Mires, MD  nitroGLYCERIN (NITROSTAT) 0.4 MG SL tablet Place 0.4 mg under the tongue every 5 (five) minutes as needed for chest pain.   Yes [provider]  Omega-3 Fatty Acids (FISH OIL) 1000 MG CAPS Take 1,000 mg  by mouth daily.    Yes [provider]  oxyCODONE-acetaminophen (PERCOCET/ROXICET) 5-325 MG tablet Take 1 tablet by mouth every 6 (six) hours as needed for severe pain.   Yes [provider]  Propylene Glycol (SYSTANE BALANCE) 0.6 % SOLN Apply 1 drop to eye 2 (two) times daily as needed (dry eyes).    Yes [provider]  pyridOXINE (VITAMIN B-6) 100 MG tablet Take 100 mg by mouth daily.   Yes [provider]  torsemide (DEMADEX) 10 MG tablet Take 2 tablets (20 mg total) by mouth 2 (two) times daily. 07/09/18  Yes Domenic Polite, MD  traZODone (DESYREL) 50 MG tablet Take 50 mg at bedtime as needed by mouth for sleep.   Yes [provider]  vitamin B-12 (CYANOCOBALAMIN) 1000 MCG tablet Take 1,000 mcg by mouth daily.   Yes [provider]  Vitamin D, Ergocalciferol, (DRISDOL) 50000 UNITS CAPS capsule Take 50,000 Units by mouth 2 (two) times a week. MON and THUR 04/07/14  Yes [provider]  vitamin E 400 UNIT capsule Take 400 Units by mouth daily.   Yes [provider]  ZOCOR  20 MG tablet Take 20 mg by mouth daily. 03/16/14  Yes [provider]  ipratropium-albuterol (DUONEB) 0.5-2.5 (3) MG/3ML SOLN Take 3 mLs by nebulization every 6 (six) hours as needed. Patient not taking: Reported on 07/23/2018 10/05/16   Chesley Mires, MD  levofloxacin (LEVAQUIN) 500 MG tablet Take 500 mg by mouth daily.    [provider]    Family History Family History  Problem Relation Age of Onset  . Breast cancer Maternal Aunt     Social History Social History   Tobacco Use  . Smoking status: Former Smoker    Packs/day: 2.00    Years: 5.00    Pack years: 10.00    Types: Cigarettes    Quit date: 01/10/1958    Years since quitting: 60.5  . Smokeless tobacco: Never Used  Substance Use Topics  . Alcohol use: No    Comment: Quit when she quit smoking  . Drug use: No     Allergies   Other   Review of Systems Review of Systems  Constitutional: Negative for chills and fever.  HENT: Negative for congestion and sore throat.   Eyes: Negative for visual disturbance.  Respiratory: Positive for cough. Negative for chest tightness and shortness of breath.   Cardiovascular: Positive for leg swelling. Negative for chest pain and palpitations.  Gastrointestinal: Negative for abdominal pain, nausea and vomiting.  Genitourinary: Negative for dysuria and flank pain.  Musculoskeletal: Negative for back pain and myalgias.  Skin: Negative for rash.  Neurological: Negative for dizziness, syncope and light-headedness.     Physical Exam Updated Vital Signs BP (!) 144/58   Pulse 68   Temp (!) 97.5 F (36.4 C)   Resp 19   SpO2 100%   Physical Exam Vitals signs and nursing note reviewed.  Constitutional:      General: She is not in acute distress.    Appearance: She is well-developed.  HENT:     Head: Normocephalic and atraumatic.     Mouth/Throat:     Mouth: Mucous membranes are moist.  Eyes:     Pupils: Pupils are equal, round, and reactive to light.      Comments: Pale conjunctiva.  Neck:     Musculoskeletal: Normal range of motion and neck supple.  Cardiovascular:     Rate and Rhythm: Normal rate and regular  rhythm.     Heart sounds: S1 normal and S2 normal. No murmur.  Pulmonary:     Effort: Pulmonary effort is normal.     Breath sounds: Rales present. No wheezing.     Comments: Coarse crackles in bilateral lung bases.  Abdominal:     General: There is no distension.     Palpations: Abdomen is soft.     Tenderness: There is no abdominal tenderness. There is no guarding.  Genitourinary:    Comments: Patient has dark, melanotic stool in rectal vault.  Normal rectal tone.  No prolapsed internal or thrombosed external hemorrhoids. Musculoskeletal: Normal range of motion.        General: No deformity.     Right lower leg: Edema present.     Left lower leg: Edema present.     Comments: Pitting edema symmetric bilaterally to level of mid shins.   Lymphadenopathy:     Cervical: No cervical adenopathy.  Skin:    General: Skin is warm and dry.     Findings: No erythema or rash.  Neurological:     Mental Status: She is alert.     Comments: Cranial nerves grossly intact. Patient moves extremities symmetrically and with good coordination.  Psychiatric:        Behavior: Behavior normal.        Thought Content: Thought content normal.        Judgment: Judgment normal.      ED Treatments / Results  Labs (all labs ordered are listed, but only abnormal results are displayed) Labs Reviewed  COMPREHENSIVE METABOLIC PANEL - Abnormal; Notable for the following components:      Result Value   Glucose, Bld 171 (*)    BUN 125 (*)    Creatinine, Ser 3.18 (*)    Calcium 8.6 (*)    Albumin 2.7 (*)    AST 13 (*)    GFR calc non Af Amer 14 (*)    GFR calc Af Amer 16 (*)    All other components within normal limits  CBC WITH DIFFERENTIAL/PLATELET - Abnormal; Notable for the following components:   WBC 11.6 (*)    RBC 2.60 (*)    Hemoglobin  7.4 (*)    HCT 23.8 (*)    RDW 16.8 (*)    Eosinophils Absolute 2.2 (*)    All other components within normal limits  PROTIME-INR - Abnormal; Notable for the following components:   Prothrombin Time 15.8 (*)    INR 1.3 (*)    All other components within normal limits  URINALYSIS, ROUTINE W REFLEX MICROSCOPIC - Abnormal; Notable for the following components:   Color, Urine STRAW (*)    All other components within normal limits  I-STAT CREATININE, ED - Abnormal; Notable for the following components:   Creatinine, Ser 3.20 (*)    All other components within normal limits  POC OCCULT BLOOD, ED - Abnormal; Notable for the following components:   Fecal Occult Bld POSITIVE (*)    All other components within normal limits  SARS CORONAVIRUS 2 (HOSPITAL ORDER, [place] LAB)  TYPE AND SCREEN    EKG EKG Interpretation  Date/Time:  Monday July 23 2018 15:39:05 EDT Ventricular Rate:  70 PR Interval:    QRS Duration: 102 QT Interval:  434 QTC Calculation: 469 R Axis:   34 Text Interpretation:  Sinus rhythm Atrial premature complex Probable anterior infarct, age indeterminate No significant change since last tracing Confirmed by Wandra Arthurs (  [id number]) on 07/23/2018 3:43:00 PM   Radiology Dg Chest Portable 1 View  Result Date: 07/23/2018 CLINICAL DATA:  Chronic cough. EXAM: PORTABLE CHEST 1 VIEW COMPARISON:  Radiograph of July 05, 2018. FINDINGS: Stable cardiomegaly. Atherosclerosis of thoracic aorta is noted. No pneumothorax or pleural effusion is noted. Both lungs are clear. The visualized skeletal structures are unremarkable. IMPRESSION: No active disease. Aortic Atherosclerosis (ICD10-I70.0). Electronically Signed   By: Marijo Conception M.D.   On: 07/23/2018 15:34    Procedures Procedures (including critical care time)  Medications Ordered in ED Medications  pantoprazole (PROTONIX) 80 mg in sodium chloride 0.9 % 250 mL (0.32 mg/mL) infusion (has no administration  in time range)  pantoprazole (PROTONIX) 80 mg in sodium chloride 0.9 % 100 mL IVPB (80 mg Intravenous New Bag/Given 07/23/18 1639)     Initial Impression / Assessment and Plan / ED Course  I have reviewed the triage vital signs and the nursing notes.  Pertinent labs & imaging results that were available during my care of the patient were reviewed by me and considered in my medical decision making (see chart for details).  Clinical Course as of Jul 22 1720  Mon Jul 23, 2018  1533 Fecal Occult Blood, POC(!): POSITIVE [AM]    Clinical Course User Index [AM] Tamala Julian       This is a 78 year old female with a past medical history of type 2 diabetes, anemia, CKD, hypertension, COPD presenting for abnormal lab.  Hemoglobin 6.5 as outpatient but will recheck here.  She is hemodynamically stable.  Patient has dark and tarry stool on exam that is occult positive.  She is not on iron.  Will initiate Protonix.  Records reviewed and patient's last colonoscopy was in 2014.  She does not take antiplatelet agents and does not have any anticoagulation.   Additionally, per record review of the medication ministration record of patient's nursing care facility, she recently finished a course of Levaquin for community-acquired pneumonia after leaving the hospital.  Patient continues to have coarse lung sounds.  Will check CXR.   Care signed out to [facility], PA-C at 1600 to follow labs.   Final Clinical Impressions(s) / ED Diagnoses   Final diagnoses:  Low hemoglobin  Occult blood positive stool  Chronic kidney disease, unspecified CKD stage    ED Discharge Orders    None       Tamala Julian 07/23/18 1730    Drenda Freeze, MD 08/30/18 [phone]

## 2018-07-24 DIAGNOSIS — D500 Iron deficiency anemia secondary to blood loss (chronic): Secondary | ICD-10-CM | POA: Diagnosis not present

## 2018-07-24 DIAGNOSIS — J439 Emphysema, unspecified: Secondary | ICD-10-CM

## 2018-07-24 DIAGNOSIS — E1122 Type 2 diabetes mellitus with diabetic chronic kidney disease: Secondary | ICD-10-CM

## 2018-07-24 DIAGNOSIS — Z794 Long term (current) use of insulin: Secondary | ICD-10-CM

## 2018-07-24 DIAGNOSIS — R195 Other fecal abnormalities: Secondary | ICD-10-CM

## 2018-07-24 DIAGNOSIS — I5042 Chronic combined systolic (congestive) and diastolic (congestive) heart failure: Secondary | ICD-10-CM

## 2018-07-24 DIAGNOSIS — L8990 Pressure ulcer of unspecified site, unspecified stage: Secondary | ICD-10-CM | POA: Insufficient documentation

## 2018-07-24 DIAGNOSIS — I700 Atherosclerosis of aorta: Secondary | ICD-10-CM | POA: Diagnosis not present

## 2018-07-24 DIAGNOSIS — R710 Precipitous drop in hematocrit: Secondary | ICD-10-CM | POA: Diagnosis not present

## 2018-07-24 DIAGNOSIS — N184 Chronic kidney disease, stage 4 (severe): Secondary | ICD-10-CM

## 2018-07-24 DIAGNOSIS — D638 Anemia in other chronic diseases classified elsewhere: Secondary | ICD-10-CM | POA: Diagnosis not present

## 2018-07-24 LAB — RENAL FUNCTION PANEL
Albumin: 2.5 g/dL — ABNORMAL LOW (ref 3.5–5.0)
Anion gap: 10 (ref 5–15)
BUN: 117 mg/dL — ABNORMAL HIGH (ref 8–23)
CO2: 23 mmol/L (ref 22–32)
Calcium: 8.3 mg/dL — ABNORMAL LOW (ref 8.9–10.3)
Chloride: 106 mmol/L (ref 98–111)
Creatinine, Ser: 2.91 mg/dL — ABNORMAL HIGH (ref 0.44–1.00)
GFR calc Af Amer: 17 mL/min — ABNORMAL LOW (ref >60)
GFR calc non Af Amer: 15 mL/min — ABNORMAL LOW (ref >60)
Glucose, Bld: 138 mg/dL — ABNORMAL HIGH (ref 70–99)
Phosphorus: 4.8 mg/dL — ABNORMAL HIGH (ref 2.5–4.6)
Potassium: 4.5 mmol/L (ref 3.5–5.1)
Sodium: 139 mmol/L (ref 135–145)

## 2018-07-24 LAB — CBC
HCT: 25 % — ABNORMAL LOW (ref 36.0–46.0)
HCT: 27 % — ABNORMAL LOW (ref 36.0–46.0)
Hemoglobin: 7.9 g/dL — ABNORMAL LOW (ref 12.0–15.0)
Hemoglobin: 8.5 g/dL — ABNORMAL LOW (ref 12.0–15.0)
MCH: 28.5 pg (ref 26.0–34.0)
MCH: 28.8 pg (ref 26.0–34.0)
MCHC: 31.6 g/dL (ref 30.0–36.0)
MCHC: 31.5 g/dL (ref 30.0–36.0)
MCV: 90.3 fL (ref 80.0–100.0)
MCV: 91.5 fL (ref 80.0–100.0)
Platelets: 266 10*3/uL (ref 150–400)
Platelets: 261 10*3/uL (ref 150–400)
RBC: 2.77 MIL/uL — ABNORMAL LOW (ref 3.87–5.11)
RBC: 2.95 MIL/uL — ABNORMAL LOW (ref 3.87–5.11)
RDW: 16.6 % — ABNORMAL HIGH (ref 11.5–15.5)
RDW: 16.9 % — ABNORMAL HIGH (ref 11.5–15.5)
WBC: 9 10*3/uL (ref 4.0–10.5)
WBC: 9.3 10*3/uL (ref 4.0–10.5)
nRBC: 0 % (ref 0.0–0.2)
nRBC: 0 % (ref 0.0–0.2)

## 2018-07-24 LAB — GLUCOSE, CAPILLARY
Glucose-Capillary: 109 mg/dL — ABNORMAL HIGH (ref 70–99)
Glucose-Capillary: 90 mg/dL (ref 70–99)
Glucose-Capillary: 199 mg/dL — ABNORMAL HIGH (ref 70–99)
Glucose-Capillary: 224 mg/dL — ABNORMAL HIGH (ref 70–99)

## 2018-07-24 LAB — SODIUM, URINE, RANDOM: Sodium, Ur: 43 mmol/L

## 2018-07-24 LAB — PATHOLOGIST SMEAR REVIEW

## 2018-07-24 MED ORDER — SODIUM CHLORIDE 0.9 % IV SOLN: INTRAVENOUS | Status: DC

## 2018-07-24 MED ORDER — TORSEMIDE 20 MG PO TABS
20.0000 mg | ORAL_TABLET | Freq: Two times a day (BID) | ORAL | Status: DC
  Administered 2018-07-24 – 2018-07-25 (×3): 20 mg via ORAL
  Filled 2018-07-24 (×3): qty 1

## 2018-07-24 MED ORDER — PANTOPRAZOLE SODIUM 40 MG PO TBEC
40.0000 mg | DELAYED_RELEASE_TABLET | Freq: Every day | ORAL | Status: DC
  Administered 2018-07-24 – 2018-08-02 (×11): 40 mg via ORAL
  Filled 2018-07-24 (×10): qty 1

## 2018-07-24 MED ORDER — ALBUTEROL SULFATE (2.5 MG/3ML) 0.083% IN NEBU
2.5000 mg | INHALATION_SOLUTION | RESPIRATORY_TRACT | Status: DC | PRN
  Administered 2018-07-24 – 2018-08-01 (×8): 2.5 mg via RESPIRATORY_TRACT
  Filled 2018-07-24 (×8): qty 3

## 2018-07-24 MED ORDER — FUROSEMIDE 10 MG/ML IJ SOLN
40.0000 mg | Freq: Once | INTRAMUSCULAR | Status: AC
  Administered 2018-07-24: 40 mg via INTRAVENOUS
  Filled 2018-07-24: qty 4

## 2018-07-24 MED ORDER — OXYCODONE-ACETAMINOPHEN 5-325 MG PO TABS
1.0000 | ORAL_TABLET | Freq: Four times a day (QID) | ORAL | Status: DC | PRN
  Administered 2018-07-25 – 2018-08-02 (×8): 1 via ORAL
  Filled 2018-07-24 (×8): qty 1

## 2018-07-24 MED ORDER — TORSEMIDE 20 MG PO TABS: 20.0000 mg | ORAL_TABLET | Freq: Two times a day (BID) | ORAL | Status: DC

## 2018-07-24 NOTE — Progress Notes (Signed)
PROGRESS NOTE  [name] BLT:[id number] DOB: 1940/06/01 DOA: 07/23/2018 PCP: Burnard Bunting, MD  Brief History   78 year old woman PMH COPD on oxygen, CKD stage IV, chronic combined systolic, diastolic CHF, peptic ulcer disease, diabetes mellitus type 2 presented for evaluation of low hemoglobin.  Recently admitted for acute on chronic kidney disease stage IV secondary to overdiuresis.  Routine labs at [facility] revealed a hemoglobin of 6.5.  Fecal occult blood was positive.  Patient was started on Protonix infusion and admitted for further evaluation.  Admitted for further evaluation of anemia.  A & P  Acute on chronic anemia, multifactorial, not iron deficient, upper GI bleed source suspected.  Fecal occult blood test positive.  Remote history of reflux esophagitis, not currently on PPI.  PMH hyperplastic and adenomatous colon polyps. --Appears clinically stable. -- Status post 1 unit PRBC --Trend hemoglobin. --GI plans EGD --Continue Protonix  Diabetes mellitus type 2 --CBG stable.  Continue Lantus, sliding scale insulin  CKD stage IV --Appears stable, close to baseline.  Chronic combined systolic and diastolic CHF --Appears stable with insignificant lower extremity edema.  Patient currently lying flat in bed without difficulty breathing. --Continue torsemide  COPD with chronic hypoxic respiratory failure on 3 L nasal cannula --Appears stable.  Bilateral heel pressure injuries, sacral decubitus --Continue management as per wound care RN  Aortic atherosclerosis --no inpatient evaluation planned.  DVT prophylaxis: SCDs Code Status: Full Family Communication: none Disposition Plan: return to [place], [place]  [facility] Direct contact: see [email or web address] (further directions at bottom of note if needed) 7PM-7AM contact night coverage as at bottom of note 07/24/2018, 5:02 PM  LOS: 0 days   Consultants  . Gastroenterology   Procedures     Antibiotics  .   Interval History/Subjective  Feels okay.  No bleeding.  No pain.  Objective   Vitals:  Vitals:   07/24/18 0541 07/24/18 1218  BP: (!) 154/73 (!) 154/56  Pulse: 62 60  Resp: 16 18  Temp: 97.6 F (36.4 C) 97.8 F (36.6 C)  SpO2: 100% 100%    Exam:  Constitutional:  . Appears calm and comfortable Eyes:  Marland Kitchen Appear grossly unremarkable ENMT:  . grossly normal hearing  . Lips appear normal Respiratory:  . CTA bilaterally, no w/r/r.  . Respiratory effort normal.  Cardiovascular:  . RRR, no m/r/g . Mild bilateral LE extremity edema   Abdomen:  . Soft, nontender, nondistended Musculoskeletal:  . RLE, LLE   . Moves lower extremities to command Psychiatric:  . Mental status o Mood, affect appropriate  I have personally reviewed the following:   Today's Data  . CBG stable . Potassium within normal limits.  Creatinine 2.91.  Anion gap 10. Marland Kitchen Hemoglobin 7.9.  Remainder CBC unremarkable.  Lab Data  . Urinalysis negative . SARS-CoV-2 negative  Micro Data  .   Imaging  . Chest x-ray no acute disease  Cardiology Data  . EKG independently reviewed sinus rhythm, nonspecific ST changes.  No acute changes seen.  Other Data  .   Scheduled Meds: . amLODipine  10 mg Oral Daily  . [START ON 07/25/2018] calcitRIOL  0.25 mcg Oral Q M,W,F  . calcium-vitamin D  1 tablet Oral Daily  . folic acid  0.5 mg Oral Daily  . insulin aspart  0-9 Units Subcutaneous TID WC  . insulin glargine  20 Units Subcutaneous QHS  . montelukast  10 mg Oral QHS  . pantoprazole  40 mg Oral Q0600  . simvastatin  20 mg Oral Daily  . sodium chloride flush  3 mL Intravenous Q12H  . torsemide  20 mg Oral BID  . vitamin B-12  1,000 mcg Oral Daily   Continuous Infusions:  Principal Problem:   Anemia due to GI blood loss Active Problems:   Anemia of chronic disease   Chronic obstructive pulmonary disease (HCC)   CKD (chronic kidney disease) stage 4, GFR  15-29 ml/min (HCC)   Type 2 diabetes mellitus (HCC)   Essential hypertension   CHF (congestive heart failure), NYHA class IV, chronic, combined ([place])   Pressure injury of skin   Occult blood positive stool   Aortic atherosclerosis ([place])   LOS: 0 days   How to contact the [facility] Attending or Consulting provider 7A - 7P or covering provider during after hours [place], for this patient?  1. Check the care team in [facility] and look for a) attending/consulting TRH provider listed and b) the [facility] team listed 2. Log into [email or web address] and use [place]'s universal password to access. If you do not have the password, please contact the hospital operator. 3. Locate the [facility] provider you are looking for under [facility] and page to a number that you can be directly reached. 4. If you still have difficulty reaching the provider, please page the [facility] (Director on Call) for the Hospitalists listed on amion for assistance.

## 2018-07-24 NOTE — Consult Note (Addendum)
[facility] Consult: 9:28 AM 07/24/2018  LOS: 0 days    Referring Provider: Dr Sarajane Jews  Primary Care Physician:  Burnard Bunting, MD Primary Gastroenterologist:  Dr. Carlean Purl    Reason for Consultation: Anemia.   HPI: [name] is a 78 y.o. female.  PMH listed below.    07/1999 EGD.  Dr Earlean Shawl for dysphagia. Erosive esophagitis c/w GER.     Adenomatous and hyperplastic colon polyps colonic diverticulosis on colonoscopy in 2001 in 2009 by Dr. Richmond Campbell.   08/2012 colonoscopy.  For hematochezia and patient personal history of adenomatous polyps.  Removed 6 mm sessile polyp at the cecum (Benign polypoid colonic mucosa with granulation tissue).  No dysplasia or malignancy..  Cecal diverticulum noted.  Mild sigmoid and descending diverticulosis.  Small internal hemorrhoids. As of 12/2017, Dr. Carlean Purl did not recommend follow-up colonoscopy based on national guidelines.  Received RBC transfusion in January, February 2015  2 separate admissions in June 2020 The first admission was for heart failure exacerbation, Proteus, Klebsiella UTI.  Hyperosmolar nonketotic hyperglycemia.  Second admission was for AKI resulting from overdiuresis and poor p.o. intake.  Debilitation and sacral and heel decubitus ulcers noted. She is a SNF resident.  Hemoglobins dropped to 6.5 yesterday and she was brought to the ED for evaluation.  Patient does not visually inspect her stools but she is not aware of bloody or dark stools.  No nausea, vomiting. meds include niferex.  No PPI etc.  No ASA, NSAIDs.   Denies nausea, vomiting, anorexia, black or bloody stools.  Hgb 7.4 >> 1 PRBC >> 7.9 .  MCV 90.  Average of 7.5 - 8.7 last month 06/2018.  PT/INR 15.8/1.3. FOBT + Ongoing stage IV CKD.   Ferritin is 86.  Iron, TIBC normal.   Folate normal.  No recent B12 level No mention of Epogen, parenteral iron.  No NSAIDs   Patient is currently nonambulatory.  However she was ambulatory with a walker when she first entered the hospital in early June.  Past Medical History:  Diagnosis Date  . Adenomatous colon polyp   . Anemia   . Arthritis   . Asthma   . COPD (chronic obstructive pulmonary disease) ([place])   . 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 Depression   . Diabetes mellitus   . Diverticulosis   . Gastric ulcer   . GERD (gastroesophageal reflux disease)   . HTN (hypertension)   . Hypercholesterolemia   . Internal hemorrhoids   . Lumbar disc disease   . On home oxygen therapy    "2-3L @ bedtime" (04/04/2017)  . OSA (obstructive sleep apnea) 09/17/2012  . Peptic ulcer disease   . 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 Umbilical hernia   . Valgus foot    right     Past Surgical History:  Procedure Laterality Date  . COLONOSCOPY  2209 or 2010    2001, 2005, 2009  colonoscopy.  [place]  . COLONOSCOPY N/A 08/14/2012   Procedure: COLONOSCOPY;  Surgeon: Ladene Artist, MD;  Location:  [facility];  Service: Endoscopy;  Laterality: N/A;  . INCISE AND DRAIN ABCESS     Abdominal Wall  . LIPOMA RESECTION     Right Neck  . TOTAL KNEE ARTHROPLASTY     Bilateral  . UMBILICAL HERNIA REPAIR  2007   incarcerated  . UPPER GASTROINTESTINAL ENDOSCOPY  2001   medoff Grade A esophagitis    Prior to Admission medications   Medication Sig Start Date End Date Taking? Authorizing Provider  acetaminophen (TYLENOL) 325 MG tablet Take 650 mg by mouth every 6 (six) hours as needed for mild pain.   Yes [provider]  Amino Acids-Protein Hydrolys (FEEDING SUPPLEMENT, PRO-STAT SUGAR FREE 64,) LIQD Take 30 mLs by mouth 2 (two) times daily. 06/20/18  Yes [place] Eric J, DO  amLODipine (NORVASC) 10 MG tablet Take 10 mg by mouth daily.    Yes [provider]  calcitRIOL (ROCALTROL) 0.25 MCG capsule Take 1  capsule (0.25 mcg total) by mouth every Monday, Wednesday, and Friday. 12/05/16  Yes Velvet Bathe, MD  Calcium Carbonate-Vitamin D (CALTRATE 600+D) 600-400 MG-UNIT tablet Take 1 tablet by mouth daily.    Yes [provider]  collagenase (SANTYL) ointment Apply 1 application topically daily.   Yes [provider]  cyclobenzaprine (FLEXERIL) 10 MG tablet Take 10 mg 3 (three) times daily as needed by mouth for muscle spasms.   Yes [provider]  folic acid (FOLVITE) 102 MCG tablet Take 400 mcg by mouth daily.   Yes [provider]  Insulin Aspart (NOVOLOG FLEXPEN [place]) Inject 0-10 Units into the skin See admin instructions. Per Sliding Scale ar 0600, 1100, 1600, 2100 100-150 0units 151-200 2units 201-250 4units 251-300 6units 301-350 8units 351-400 10units   Yes [provider]  insulin aspart (NOVOLOG) 100 UNIT/ML injection Inject 6 Units into the skin 3 (three) times daily with meals. 06/20/18  Yes [place] Eric J, DO  insulin glargine (LANTUS) 100 UNIT/ML injection Inject 0.3 mLs (30 Units total) into the skin at bedtime. Patient taking differently: Inject 30 Units into the skin daily.  06/20/18  Yes [place] Donnamarie Poag, DO  iron polysaccharides (NIFEREX) 150 MG capsule Take 150 mg by mouth 2 (two) times daily.   Yes [provider]  magnesium oxide (MAG-OX) 400 MG tablet Take 400 mg by mouth daily.   Yes [provider]  montelukast (SINGULAIR) 10 MG tablet Take 1 tablet (10 mg total) by mouth at bedtime. 10/05/16  Yes Chesley Mires, MD  nitroGLYCERIN (NITROSTAT) 0.4 MG SL tablet Place 0.4 mg under the tongue every 5 (five) minutes as needed for chest pain.   Yes [provider]  Omega-3 Fatty Acids (FISH OIL) 1000 MG CAPS Take 1,000 mg by mouth daily.    Yes [provider]  oxyCODONE-acetaminophen (PERCOCET/ROXICET) 5-325 MG tablet Take 1 tablet by mouth every 6 (six) hours as needed for severe pain.   Yes [provider]  Propylene Glycol (SYSTANE BALANCE) 0.6 % SOLN Apply 1 drop to eye 2 (two) times daily as needed (dry eyes).    Yes [provider]  pyridOXINE (VITAMIN B-6) 100 MG tablet Take 100 mg by mouth daily.   Yes [provider]  torsemide (DEMADEX) 10 MG tablet Take 2 tablets (20 mg total) by mouth 2 (two) times daily. 07/09/18  Yes Domenic Polite, MD  traZODone (DESYREL) 50 MG tablet Take 50 mg at bedtime as needed by mouth for sleep.   Yes [provider]  vitamin  B-12 (CYANOCOBALAMIN) 1000 MCG tablet Take 1,000 mcg by mouth daily.   Yes [provider]  Vitamin D, Ergocalciferol, (DRISDOL) 50000 UNITS CAPS capsule Take 50,000 Units by mouth 2 (two) times a week. MON and THUR 04/07/14  Yes [provider]  vitamin E 400 UNIT capsule Take 400 Units by mouth daily.   Yes [provider]  ZOCOR 20 MG tablet Take 20 mg by mouth daily. 03/16/14  Yes [provider]  ipratropium-albuterol (DUONEB) 0.5-2.5 (3) MG/3ML SOLN Take 3 mLs by nebulization every 6 (six) hours as needed. Patient not taking: Reported on 07/23/2018 10/05/16   Chesley Mires, MD    Scheduled Meds: . amLODipine  10 mg Oral Daily  . [START ON 07/25/2018] calcitRIOL  0.25 mcg Oral Q M,W,F  . calcium-vitamin D  1 tablet Oral Daily  . folic acid  0.5 mg Oral Daily  . insulin aspart  0-9 Units Subcutaneous TID WC  . insulin glargine  20 Units Subcutaneous QHS  . montelukast  10 mg Oral QHS  . simvastatin  20 mg Oral Daily  . sodium chloride flush  3 mL Intravenous Q12H  . torsemide  20 mg Oral BID  . vitamin B-12  1,000 mcg Oral Daily   Infusions: . pantoprozole (PROTONIX) infusion 8 mg/hr (07/24/18 0540)   PRN Meds: acetaminophen **OR** acetaminophen, albuterol, ondansetron **OR** ondansetron (ZOFRAN) IV, senna-docusate   Allergies as of 07/23/2018 - Review Complete 07/23/2018  Allergen Reaction Noted  . Other Other (See Comments) 03/06/2013    Family  History  Problem Relation Age of Onset  . Breast cancer Maternal Aunt     Social History   Socioeconomic History  . Marital status: Single    Spouse name: Not on file  . Number of children: 0  . Years of education: Not on file  . Highest education level: Not on file  Occupational History  . Occupation: Retired    Comment: [employer]  Social Needs                                Tobacco Use  . Smoking status: Former Smoker    Packs/day: 2.00    Years: 5.00    Pack years: 10.00    Types: Cigarettes    Quit date: 01/10/1958    Years since quitting: 60.5  . Smokeless tobacco: Never Used  Substance and Sexual Activity  . Alcohol use: No    Comment: Quit when she quit smoking  . Drug use: No  . Sexual activity: Never                                                                                     Social History Narrative   Retired from [employer] no children   Sister in [place]: Constitutional: Feels tired. ENT:  No nose bleeds Pulm: Denies new shortness of breath.  Occasionally productive cough. CV:  No palpitations, no LE edema.  GU:  No hematuria, no frequency GI: See HPI. Heme: Denies unusual bleeding. Transfusions: See HPI. Neuro:  No headaches,  no peripheral tingling or numbness.  No syncope. Derm: Chronic swelling in her lower legs is painful to the touch.  Sores on her heels. Endocrine:  No sweats or chills.  No polyuria or dysuria Immunization: Reviewed. Travel:  None beyond local counties in last few months.    PHYSICAL EXAM: Vital signs in last 24 hours: Vitals:   07/24/18 0257 07/24/18 0541  BP: 139/71 (!) 154/73  Pulse: 66 62  Resp: 18 16  Temp: 97.9 F (36.6 C) 97.6 F (36.4 C)  SpO2: 95% 100%   Wt Readings from Last 3 Encounters:  07/24/18 73.5 kg  07/10/18 72 kg  06/20/18 83.9 kg    General: Obese.  Chronically ill-appearing AAF.  Sitting comfortably in bed. Head: No asymmetry  Eyes: Conjunctiva pale. Ears: Slightly HOH Nose: No discharge or congestion. Mouth: Oral mucosa pink, moist, clear.  Tongue midline.  Extensive dental repair. Neck: No JVD, no masses, no thyromegaly. Lungs: No cough.  Slight dyspnea with speaking.  Reduced but clear lungs. Heart: RRR.  No MRG.  S1, S2 present. Abdomen: Obese.  Not tender or distended.  Soft.  Active bowel sounds.  No masses, HSM, bruits, hernias..   Rectal: Deferred Musc/Skeltl: Marketed valgus deformity of her feet. Extremities: Bolus, tender to touch bilateral lower extremity edema.  Legs are warm to the touch. Neurologic: Alert.  Appropriate.  Oriented x3. Nodes: No cervical adenopathy. Psych: Operative, pleasant.  Intake/Output from previous day: 07/13 0701 - 07/14 0700 In: 589.7 [I.V.:264.7; Blood:325] Out: 500 [Urine:500] Intake/Output this shift: No intake/output data recorded.  LAB RESULTS: Recent Labs    07/23/18 1521 07/24/18 0602  WBC 11.6* 9.0  HGB 7.4* 7.9*  HCT 23.8* 25.0*  PLT 281 266   BMET Lab Results  Component Value Date   NA 139 07/24/2018   NA 138 07/23/2018   NA 139 07/09/2018   K 4.5 07/24/2018   K 4.6 07/23/2018   K 4.5 07/09/2018   CL 106 07/24/2018   CL 106 07/23/2018   CL 103 07/09/2018   CO2 23 07/24/2018   CO2 23 07/23/2018   CO2 27 07/09/2018   GLUCOSE 138 (H) 07/24/2018   GLUCOSE 171 (H) 07/23/2018   GLUCOSE 221 (H) 07/09/2018   BUN 117 (H) 07/24/2018   BUN 125 (H) 07/23/2018   BUN 84 (H) 07/09/2018   CREATININE 2.91 (H) 07/24/2018   CREATININE 3.20 (H) 07/23/2018   CREATININE 3.18 (H) 07/23/2018   CALCIUM 8.3 (L) 07/24/2018   CALCIUM 8.6 (L) 07/23/2018   CALCIUM 8.5 (L) 07/09/2018   LFT Recent Labs    07/23/18 1521 07/24/18 0602  PROT 6.9  --   ALBUMIN 2.7* 2.5*  AST 13*  --   ALT 16  --   ALKPHOS 76  --   BILITOT 0.5  --    PT/INR Lab Results  Component Value Date   INR 1.3 (H) 07/23/2018   INR 1.20 04/03/2017   INR 1.23 11/23/2016  Lipase      Component Value Date/Time   LIPASE 21 04/03/2017 1542    Drugs of Abuse  No results found for: LABOPIA, COCAINSCRNUR, LABBENZ, AMPHETMU, THCU, LABBARB   RADIOLOGY STUDIES: Dg Chest Portable 1 View  Result Date: 07/23/2018 CLINICAL DATA:  Chronic cough. EXAM: PORTABLE CHEST 1 VIEW COMPARISON:  Radiograph of July 05, 2018. FINDINGS: Stable cardiomegaly. Atherosclerosis of thoracic aorta is noted. No pneumothorax or pleural effusion is noted. Both lungs are clear. The visualized skeletal structures are unremarkable. IMPRESSION: No active  disease. Aortic Atherosclerosis (ICD10-I70.0). Electronically Signed   By: Marijo Conception M.D.   On: 07/23/2018 15:34     IMPRESSION:   *   Acute on chronic anemia.  Anemia is multifactorial.  She is not iron deficient. She is FOBT positive but there is no melena or bleeding reported. Advanced chronic kidney disease certainly contributing and I cannot see that she has or is receiving parenteral iron infusions or Epogen. Remote history of reflux esophagitis, not currently on PPI.  She reports no reflux type symptoms. Previous hyperplastic and adenomatous colon polyps.  Aged out of screening colonoscopy.  *    CKD, stage IV  *     IDDM.  *   Physical debilitation.  *   Congestive heart failure.  Current x-ray shows no acute heart failure.    PLAN:     *     Dr Carlean Purl can decide on EGD and or colonoscopy though looking at her today it looks like it would be best to start with an EGD as it looks like colonoscopy and its required prep would be challenging for this patient.  *   Allow diabetic diet.      *   Stopped Protonix drip.  Replaced with oral Protonix.  *     Obtain vitamin B12 level   Azucena Freed  07/24/2018, 9:28 AM Phone [phone]      [facility] Attending   I have taken an interval history, reviewed the chart and examined the patient. I agree with the Advanced Practitioner's note, impression and recommendations.    Decreased Hgb of unclear etiology but heme + (does not mean she has blood loss from GI bleed) Upper GI bleeding is possible but suspect multifactorial  I recommended she consider EGD and we discussed and have decided to pursue that looking for a cause The risks and benefits as well as alternatives of endoscopic procedure(s) have been discussed and reviewed. All questions answered. The patient agrees to proceed.  Gatha Mayer, MD, [facility] 07/24/2018 2:46 PM Pager [phone]

## 2018-07-24 NOTE — Consult Note (Signed)
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 (Unstageable). Scant drainage, serosanguinous. Left heel: 5cm x 5cm x 0.1cm peeling epidermis with evidence of previous wound healing. Left eschar on lateral left foot: 6cm x 5cm black soft eschar. No drainage. Left hip: 0.8cm x 1cm x 0.2cm with 40% yellow wound bed and no exudate. Stage 3 PI. Sacrum/coccygeal: 1cm x 0.8cm with red moist wound bed, Stage 2PI Wound bed:As described above Drainage (amount, consistency, odor) As described above Periwound: intact, dry Dressing procedure/placement/frequency: We will provide a mattress replacement today as patient is at risk for continuing skin breakdown and this may assist with the mitigation of pressure and moisture risk factors. I am not certain that the pressure redistribution heel boots will work given her rigid and laterally rotated feet and ankles, but I would like to try them. The left lateral foot eschar will be painted with betadine twice daily in an attempt to dry and stabalize eschar while preventing infection. Topical wound care guidence to the right hip is provided, using a saline moistened gauze beneath silicone foam. A silicone foam will address the sacral/coccygeal area of partial thickness tissue loss. Turning and repositioning will continue.  [place] nursing team will not follow, but will remain available to this patient, the nursing and medical teams.  Please re-consult if needed. Thanks, Maudie Flakes, MSN, RN, [place], Ar[facility]  Pager# [phone]

## 2018-07-24 NOTE — H&P (View-Only) (Signed)
[facility] Consult: 9:28 AM 07/24/2018  LOS: 0 days    Referring Provider: Dr Sarajane Jews  Primary Care Physician:  Burnard Bunting, MD Primary Gastroenterologist:  Dr. Carlean Purl    Reason for Consultation: Anemia.   HPI: Nicole KEEVEN is a 78 y.o. female.  PMH listed below.    07/1999 EGD.  Dr Earlean Shawl for dysphagia. Erosive esophagitis c/w GER.     Adenomatous and hyperplastic colon polyps colonic diverticulosis on colonoscopy in 2001 in 2009 by Dr. Richmond Campbell.   08/2012 colonoscopy.  For hematochezia and patient personal history of adenomatous polyps.  Removed 6 mm sessile polyp at the cecum (Benign polypoid colonic mucosa with granulation tissue).  No dysplasia or malignancy..  Cecal diverticulum noted.  Mild sigmoid and descending diverticulosis.  Small internal hemorrhoids. As of 12/2017, Dr. Carlean Purl did not recommend follow-up colonoscopy based on national guidelines.  Received RBC transfusion in January, February 2015  2 separate admissions in June 2020 The first admission was for heart failure exacerbation, Proteus, Klebsiella UTI.  Hyperosmolar nonketotic hyperglycemia.  Second admission was for AKI resulting from overdiuresis and poor p.o. intake.  Debilitation and sacral and heel decubitus ulcers noted. She is a SNF resident.  Hemoglobins dropped to 6.5 yesterday and she was brought to the ED for evaluation.  Patient does not visually inspect her stools but she is not aware of bloody or dark stools.  No nausea, vomiting. meds include niferex.  No PPI etc.  No ASA, NSAIDs.   Denies nausea, vomiting, anorexia, black or bloody stools.  Hgb 7.4 >> 1 PRBC >> 7.9 .  MCV 90.  Average of 7.5 - 8.7 last month 06/2018.  PT/INR 15.8/1.3. FOBT + Ongoing stage IV CKD.   Ferritin is 86.  Iron, TIBC normal.   Folate normal.  No recent B12 level No mention of Epogen, parenteral iron.  No NSAIDs   Patient is currently nonambulatory.  However she was ambulatory with a walker when she first entered the hospital in early June.  Past Medical History:  Diagnosis Date  . Adenomatous colon polyp   . Anemia   . Arthritis   . Asthma   . COPD (chronic obstructive pulmonary disease) ([place])   . COPD with asthma ([place]) 04/20/2011  . Depression   . Diabetes mellitus   . Diverticulosis   . Gastric ulcer   . GERD (gastroesophageal reflux disease)   . HTN (hypertension)   . Hypercholesterolemia   . Internal hemorrhoids   . Lumbar disc disease   . On home oxygen therapy    "2-3L @ bedtime" (04/04/2017)  . OSA (obstructive sleep apnea) 09/17/2012  . Peptic ulcer disease   . 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 Umbilical hernia   . Valgus foot    right     Past Surgical History:  Procedure Laterality Date  . COLONOSCOPY  2209 or 2010    2001, 2005, 2009  colonoscopy.  [place]  . COLONOSCOPY N/A 08/14/2012   Procedure: COLONOSCOPY;  Surgeon: Ladene Artist, MD;  Location:  [facility] END[facility]COPY;  Service: Endoscopy;  Laterality: N/A;  . INCISE AND DRAIN ABCESS     Abdominal Wall  . LIPOMA RESECTION     Right Neck  . TOTAL KNEE ARTHROPLASTY     Bilateral  . UMBILICAL HERNIA REPAIR  2007   incarcerated  . UPPER GASTROINTESTINAL ENDOSCOPY  2001   medoff Grade A esophagitis    Prior to Admission medications   Medication Sig Start Date End Date Taking? Authorizing Provider  acetaminophen (TYLENOL) 325 MG tablet Take 650 mg by mouth every 6 (six) hours as needed for mild pain.   Yes [provider]  Amino Acids-Protein Hydrolys (FEEDING SUPPLEMENT, PRO-STAT SUGAR FREE 64,) LIQD Take 30 mLs by mouth 2 (two) times daily. 06/20/18  Yes [place] Eric J, DO  amLODipine (NORVASC) 10 MG tablet Take 10 mg by mouth daily.    Yes [provider]  calcitRIOL (ROCALTROL) 0.25 MCG capsule Take 1  capsule (0.25 mcg total) by mouth every Monday, Wednesday, and Friday. 12/05/16  Yes Velvet Bathe, MD  Calcium Carbonate-Vitamin D (CALTRATE 600+D) 600-400 MG-UNIT tablet Take 1 tablet by mouth daily.    Yes [provider]  collagenase (SANTYL) ointment Apply 1 application topically daily.   Yes [provider]  cyclobenzaprine (FLEXERIL) 10 MG tablet Take 10 mg 3 (three) times daily as needed by mouth for muscle spasms.   Yes [provider]  folic acid (FOLVITE) 025 MCG tablet Take 400 mcg by mouth daily.   Yes [provider]  Insulin Aspart (NOVOLOG FLEXPEN [place]) Inject 0-10 Units into the skin See admin instructions. Per Sliding Scale ar 0600, 1100, 1600, 2100 100-150 0units 151-200 2units 201-250 4units 251-300 6units 301-350 8units 351-400 10units   Yes [provider]  insulin aspart (NOVOLOG) 100 UNIT/ML injection Inject 6 Units into the skin 3 (three) times daily with meals. 06/20/18  Yes [place] Eric J, DO  insulin glargine (LANTUS) 100 UNIT/ML injection Inject 0.3 mLs (30 Units total) into the skin at bedtime. Patient taking differently: Inject 30 Units into the skin daily.  06/20/18  Yes [place] Donnamarie Poag, DO  iron polysaccharides (NIFEREX) 150 MG capsule Take 150 mg by mouth 2 (two) times daily.   Yes [provider]  magnesium oxide (MAG-OX) 400 MG tablet Take 400 mg by mouth daily.   Yes [provider]  montelukast (SINGULAIR) 10 MG tablet Take 1 tablet (10 mg total) by mouth at bedtime. 10/05/16  Yes Chesley Mires, MD  nitroGLYCERIN (NITROSTAT) 0.4 MG SL tablet Place 0.4 mg under the tongue every 5 (five) minutes as needed for chest pain.   Yes [provider]  Omega-3 Fatty Acids (FISH OIL) 1000 MG CAPS Take 1,000 mg by mouth daily.    Yes [provider]  oxyCODONE-acetaminophen (PERCOCET/ROXICET) 5-325 MG tablet Take 1 tablet by mouth every 6 (six) hours as needed for severe pain.   Yes [provider]  Propylene Glycol (SYSTANE BALANCE) 0.6 % SOLN Apply 1 drop to eye 2 (two) times daily as needed (dry eyes).    Yes [provider]  pyridOXINE (VITAMIN B-6) 100 MG tablet Take 100 mg by mouth daily.   Yes [provider]  torsemide (DEMADEX) 10 MG tablet Take 2 tablets (20 mg total) by mouth 2 (two) times daily. 07/09/18  Yes Domenic Polite, MD  traZODone (DESYREL) 50 MG tablet Take 50 mg at bedtime as needed by mouth for sleep.   Yes [provider]  vitamin  B-12 (CYANOCOBALAMIN) 1000 MCG tablet Take 1,000 mcg by mouth daily.   Yes [provider]  Vitamin D, Ergocalciferol, (DRISDOL) 50000 UNITS CAPS capsule Take 50,000 Units by mouth 2 (two) times a week. MON and THUR 04/07/14  Yes [provider]  vitamin E 400 UNIT capsule Take 400 Units by mouth daily.   Yes [provider]  ZOCOR 20 MG tablet Take 20 mg by mouth daily. 03/16/14  Yes [provider]  ipratropium-albuterol (DUONEB) 0.5-2.5 (3) MG/3ML SOLN Take 3 mLs by nebulization every 6 (six) hours as needed. Patient not taking: Reported on 07/23/2018 10/05/16   Chesley Mires, MD    Scheduled Meds: . amLODipine  10 mg Oral Daily  . [START ON 07/25/2018] calcitRIOL  0.25 mcg Oral Q M,W,F  . calcium-vitamin D  1 tablet Oral Daily  . folic acid  0.5 mg Oral Daily  . insulin aspart  0-9 Units Subcutaneous TID WC  . insulin glargine  20 Units Subcutaneous QHS  . montelukast  10 mg Oral QHS  . simvastatin  20 mg Oral Daily  . sodium chloride flush  3 mL Intravenous Q12H  . torsemide  20 mg Oral BID  . vitamin B-12  1,000 mcg Oral Daily   Infusions: . pantoprozole (PROTONIX) infusion 8 mg/hr (07/24/18 0540)   PRN Meds: acetaminophen **OR** acetaminophen, albuterol, ondansetron **OR** ondansetron (ZOFRAN) IV, senna-docusate   Allergies as of 07/23/2018 - Review Complete 07/23/2018  Allergen Reaction Noted  . Other Other (See Comments) 03/06/2013    Family  History  Problem Relation Age of Onset  . Breast cancer Maternal Aunt     Social History   Socioeconomic History  . Marital status: Single    Spouse name: Not on file  . Number of children: 0  . Years of education: Not on file  . Highest education level: Not on file  Occupational History  . Occupation: Retired    Comment: [employer]  Social Needs                                Tobacco Use  . Smoking status: Former Smoker    Packs/day: 2.00    Years: 5.00    Pack years: 10.00    Types: Cigarettes    Quit date: 01/10/1958    Years since quitting: 60.5  . Smokeless tobacco: Never Used  Substance and Sexual Activity  . Alcohol use: No    Comment: Quit when she quit smoking  . Drug use: No  . Sexual activity: Never                                                                                     Social History Narrative   Retired from [employer] no children   Sister in [place]: Constitutional: Feels tired. ENT:  No nose bleeds Pulm: Denies new shortness of breath.  Occasionally productive cough. CV:  No palpitations, no LE edema.  GU:  No hematuria, no frequency GI: See HPI. Heme: Denies unusual bleeding. Transfusions: See HPI. Neuro:  No headaches,  no peripheral tingling or numbness.  No syncope. Derm: Chronic swelling in her lower legs is painful to the touch.  Sores on her heels. Endocrine:  No sweats or chills.  No polyuria or dysuria Immunization: Reviewed. Travel:  None beyond local counties in last few months.    PHYSICAL EXAM: Vital signs in last 24 hours: Vitals:   07/24/18 0257 07/24/18 0541  BP: 139/71 (!) 154/73  Pulse: 66 62  Resp: 18 16  Temp: 97.9 F (36.6 C) 97.6 F (36.4 C)  SpO2: 95% 100%   Wt Readings from Last 3 Encounters:  07/24/18 73.5 kg  07/10/18 72 kg  06/20/18 83.9 kg    General: Obese.  Chronically ill-appearing AAF.  Sitting comfortably in bed. Head: No asymmetry  Eyes: Conjunctiva pale. Ears: Slightly HOH Nose: No discharge or congestion. Mouth: Oral mucosa pink, moist, clear.  Tongue midline.  Extensive dental repair. Neck: No JVD, no masses, no thyromegaly. Lungs: No cough.  Slight dyspnea with speaking.  Reduced but clear lungs. Heart: RRR.  No MRG.  S1, S2 present. Abdomen: Obese.  Not tender or distended.  Soft.  Active bowel sounds.  No masses, HSM, bruits, hernias..   Rectal: Deferred Musc/Skeltl: Marketed valgus deformity of her feet. Extremities: Bolus, tender to touch bilateral lower extremity edema.  Legs are warm to the touch. Neurologic: Alert.  Appropriate.  Oriented x3. Nodes: No cervical adenopathy. Psych: Operative, pleasant.  Intake/Output from previous day: 07/13 0701 - 07/14 0700 In: 589.7 [I.V.:264.7; Blood:325] Out: 500 [Urine:500] Intake/Output this shift: No intake/output data recorded.  LAB RESULTS: Recent Labs    07/23/18 1521 07/24/18 0602  WBC 11.6* 9.0  HGB 7.4* 7.9*  HCT 23.8* 25.0*  PLT 281 266   BMET Lab Results  Component Value Date   NA 139 07/24/2018   NA 138 07/23/2018   NA 139 07/09/2018   K 4.5 07/24/2018   K 4.6 07/23/2018   K 4.5 07/09/2018   CL 106 07/24/2018   CL 106 07/23/2018   CL 103 07/09/2018   CO2 23 07/24/2018   CO2 23 07/23/2018   CO2 27 07/09/2018   GLUCOSE 138 (H) 07/24/2018   GLUCOSE 171 (H) 07/23/2018   GLUCOSE 221 (H) 07/09/2018   BUN 117 (H) 07/24/2018   BUN 125 (H) 07/23/2018   BUN 84 (H) 07/09/2018   CREATININE 2.91 (H) 07/24/2018   CREATININE 3.20 (H) 07/23/2018   CREATININE 3.18 (H) 07/23/2018   CALCIUM 8.3 (L) 07/24/2018   CALCIUM 8.6 (L) 07/23/2018   CALCIUM 8.5 (L) 07/09/2018   LFT Recent Labs    07/23/18 1521 07/24/18 0602  PROT 6.9  --   ALBUMIN 2.7* 2.5*  AST 13*  --   ALT 16  --   ALKPHOS 76  --   BILITOT 0.5  --    PT/INR Lab Results  Component Value Date   INR 1.3 (H) 07/23/2018   INR 1.20 04/03/2017   INR 1.23 11/23/2016  Lipase      Component Value Date/Time   LIPASE 21 04/03/2017 1542    Drugs of Abuse  No results found for: LABOPIA, COCAINSCRNUR, LABBENZ, AMPHETMU, THCU, LABBARB   RADIOLOGY STUDIES: Dg Chest Portable 1 View  Result Date: 07/23/2018 CLINICAL DATA:  Chronic cough. EXAM: PORTABLE CHEST 1 VIEW COMPARISON:  Radiograph of July 05, 2018. FINDINGS: Stable cardiomegaly. Atherosclerosis of thoracic aorta is noted. No pneumothorax or pleural effusion is noted. Both lungs are clear. The visualized skeletal structures are unremarkable. IMPRESSION: No active  disease. Aortic Atherosclerosis (ICD10-I70.0). Electronically Signed   By: Marijo Conception M.D.   On: 07/23/2018 15:34     IMPRESSION:   *   Acute on chronic anemia.  Anemia is multifactorial.  She is not iron deficient. She is FOBT positive but there is no melena or bleeding reported. Advanced chronic kidney disease certainly contributing and I cannot see that she has or is receiving parenteral iron infusions or Epogen. Remote history of reflux esophagitis, not currently on PPI.  She reports no reflux type symptoms. Previous hyperplastic and adenomatous colon polyps.  Aged out of screening colonoscopy.  *    CKD, stage IV  *     IDDM.  *   Physical debilitation.  *   Congestive heart failure.  Current x-ray shows no acute heart failure.    PLAN:     *     Dr Carlean Purl can decide on EGD and or colonoscopy though looking at her today it looks like it would be best to start with an EGD as it looks like colonoscopy and its required prep would be challenging for this patient.  *   Allow diabetic diet.      *   Stopped Protonix drip.  Replaced with oral Protonix.  *     Obtain vitamin B12 level   Azucena Freed  07/24/2018, 9:28 AM Phone [phone]      [facility] Attending   I have taken an interval history, reviewed the chart and examined the patient. I agree with the Advanced Practitioner's note, impression and recommendations.    Decreased Hgb of unclear etiology but heme + (does not mean she has blood loss from GI bleed) Upper GI bleeding is possible but suspect multifactorial  I recommended she consider EGD and we discussed and have decided to pursue that looking for a cause The risks and benefits as well as alternatives of endoscopic procedure(s) have been discussed and reviewed. All questions answered. The patient agrees to proceed.  Gatha Mayer, MD, [facility] 07/24/2018 2:46 PM Pager [phone]

## 2018-07-24 NOTE — Progress Notes (Signed)
Blister is intact in wound

## 2018-07-24 NOTE — Anesthesia Preprocedure Evaluation (Addendum)
Anesthesia Evaluation  Patient identified by MRN, date of birth, ID band Patient awake    Reviewed: Allergy & Precautions, NPO status , Patient's Chart, lab work & pertinent test results  History of Anesthesia Complications Negative for: history of anesthetic complications  Airway Mallampati: III   Neck ROM: Full    Dental  (+) Dental Advisory Given, Partial Upper   Pulmonary asthma , sleep apnea and Oxygen sleep apnea , COPD,  COPD inhaler, former smoker,    breath sounds clear to auscultation       Cardiovascular hypertension, Pt. on medications +CHF  + Valvular Problems/Murmurs AS  Rhythm:Irregular Rate:Normal   '20 TTE - EF >65%. Mildly increased left ventricular wall thickness. Left ventricular diastolic Doppler parameters are consistent with pseudonormalization. RV cavity was mildly enlarged. Right ventricular systolic pressure is severely elevated with an estimated pressure of 79.2 mmHg. LA was severely dilated. RA was moderately dilated. Mild-moderate TR. Mild AI, moderate AS. AV Mean Grad: 25.0 mmHg. AV Area (VTI): 1.54 cm.    Neuro/Psych PSYCHIATRIC DISORDERS Depression negative neurological ROS     GI/Hepatic Neg liver ROS, PUD, GERD  Medicated and Controlled,  Endo/Other  diabetes, Type 2, Insulin Dependent  Renal/GU CRFRenal disease     Musculoskeletal  (+) Arthritis ,   Abdominal   Peds  Hematology  (+) anemia ,   Anesthesia Other Findings   Reproductive/Obstetrics                            Anesthesia Physical Anesthesia Plan  ASA: IV  Anesthesia Plan: MAC   Post-op Pain Management:    Induction: Intravenous  PONV Risk Score and Plan: 2 and Propofol infusion and Treatment may vary due to age or medical condition  Airway Management Planned: Nasal Cannula and Natural Airway  Additional Equipment: None  Intra-op Plan:   Post-operative Plan:   Informed Consent: I  have reviewed the patients History and Physical, chart, labs and discussed the procedure including the risks, benefits and alternatives for the proposed anesthesia with the patient or authorized representative who has indicated his/her understanding and acceptance.       Plan Discussed with: CRNA and Anesthesiologist  Anesthesia Plan Comments:        Anesthesia Quick Evaluation

## 2018-07-24 NOTE — Progress Notes (Signed)
Pt admitted to room. 5w16. She has dressings to bilateral feet and dressing to hip. Pt is alert and oriented and was oriented to call light and tv. Pt does states she is unable to walk at this time. Dressing removed from bilateral feet and was noted to have black eschar. Dressing to hip was removed and was noted to have yellowish and pink tissue. Mid coccyx was noted to also have a pink tissue sore.

## 2018-07-25 ENCOUNTER — Encounter (HOSPITAL_COMMUNITY)
Admission: EM | Disposition: A | Discharge status: Skilled Nursing Facility | Payer: Self-pay | Source: Skilled Nursing Facility | Attending: Internal Medicine

## 2018-07-25 ENCOUNTER — Encounter (HOSPITAL_COMMUNITY): Payer: Self-pay | Admitting: *Deleted

## 2018-07-25 ENCOUNTER — Observation Stay (HOSPITAL_COMMUNITY): Payer: Medicare Other | Admitting: Anesthesiology

## 2018-07-25 DIAGNOSIS — L89223 Pressure ulcer of left hip, stage 3: Secondary | ICD-10-CM | POA: Diagnosis present

## 2018-07-25 DIAGNOSIS — Z86010 Personal history of colonic polyps: Secondary | ICD-10-CM | POA: Diagnosis not present

## 2018-07-25 DIAGNOSIS — N184 Chronic kidney disease, stage 4 (severe): Secondary | ICD-10-CM | POA: Diagnosis present

## 2018-07-25 DIAGNOSIS — Z9981 Dependence on supplemental oxygen: Secondary | ICD-10-CM | POA: Diagnosis not present

## 2018-07-25 DIAGNOSIS — F329 Major depressive disorder, single episode, unspecified: Secondary | ICD-10-CM | POA: Diagnosis present

## 2018-07-25 DIAGNOSIS — N179 Acute kidney failure, unspecified: Secondary | ICD-10-CM | POA: Diagnosis present

## 2018-07-25 DIAGNOSIS — J9621 Acute and chronic respiratory failure with hypoxia: Secondary | ICD-10-CM | POA: Diagnosis present

## 2018-07-25 DIAGNOSIS — G4733 Obstructive sleep apnea (adult) (pediatric): Secondary | ICD-10-CM | POA: Diagnosis present

## 2018-07-25 DIAGNOSIS — Z8711 Personal history of peptic ulcer disease: Secondary | ICD-10-CM | POA: Diagnosis not present

## 2018-07-25 DIAGNOSIS — D638 Anemia in other chronic diseases classified elsewhere: Secondary | ICD-10-CM | POA: Diagnosis present

## 2018-07-25 DIAGNOSIS — Z20828 Contact with and (suspected) exposure to other viral communicable diseases: Secondary | ICD-10-CM | POA: Diagnosis present

## 2018-07-25 DIAGNOSIS — K922 Gastrointestinal hemorrhage, unspecified: Secondary | ICD-10-CM | POA: Diagnosis present

## 2018-07-25 DIAGNOSIS — I5042 Chronic combined systolic (congestive) and diastolic (congestive) heart failure: Secondary | ICD-10-CM | POA: Diagnosis not present

## 2018-07-25 DIAGNOSIS — D62 Acute posthemorrhagic anemia: Secondary | ICD-10-CM | POA: Diagnosis present

## 2018-07-25 DIAGNOSIS — I700 Atherosclerosis of aorta: Secondary | ICD-10-CM | POA: Diagnosis present

## 2018-07-25 DIAGNOSIS — K219 Gastro-esophageal reflux disease without esophagitis: Secondary | ICD-10-CM | POA: Diagnosis present

## 2018-07-25 DIAGNOSIS — J449 Chronic obstructive pulmonary disease, unspecified: Secondary | ICD-10-CM | POA: Diagnosis present

## 2018-07-25 DIAGNOSIS — Z803 Family history of malignant neoplasm of breast: Secondary | ICD-10-CM | POA: Diagnosis not present

## 2018-07-25 DIAGNOSIS — Z794 Long term (current) use of insulin: Secondary | ICD-10-CM | POA: Diagnosis not present

## 2018-07-25 DIAGNOSIS — I10 Essential (primary) hypertension: Secondary | ICD-10-CM

## 2018-07-25 DIAGNOSIS — Z96653 Presence of artificial knee joint, bilateral: Secondary | ICD-10-CM | POA: Diagnosis present

## 2018-07-25 DIAGNOSIS — Z79891 Long term (current) use of opiate analgesic: Secondary | ICD-10-CM | POA: Diagnosis not present

## 2018-07-25 DIAGNOSIS — J439 Emphysema, unspecified: Secondary | ICD-10-CM | POA: Diagnosis not present

## 2018-07-25 DIAGNOSIS — I5043 Acute on chronic combined systolic (congestive) and diastolic (congestive) heart failure: Secondary | ICD-10-CM | POA: Diagnosis present

## 2018-07-25 DIAGNOSIS — D500 Iron deficiency anemia secondary to blood loss (chronic): Secondary | ICD-10-CM | POA: Diagnosis not present

## 2018-07-25 DIAGNOSIS — Z79899 Other long term (current) drug therapy: Secondary | ICD-10-CM | POA: Diagnosis not present

## 2018-07-25 DIAGNOSIS — R195 Other fecal abnormalities: Secondary | ICD-10-CM | POA: Diagnosis present

## 2018-07-25 DIAGNOSIS — E1122 Type 2 diabetes mellitus with diabetic chronic kidney disease: Secondary | ICD-10-CM | POA: Diagnosis present

## 2018-07-25 DIAGNOSIS — I130 Hypertensive heart and chronic kidney disease with heart failure and stage 1 through stage 4 chronic kidney disease, or unspecified chronic kidney disease: Secondary | ICD-10-CM | POA: Diagnosis present

## 2018-07-25 HISTORY — PX: ESOPHAGOGASTRODUODENOSCOPY (EGD) WITH PROPOFOL: SHX5813

## 2018-07-25 LAB — CBC
HCT: 24.7 % — ABNORMAL LOW (ref 36.0–46.0)
Hemoglobin: 7.9 g/dL — ABNORMAL LOW (ref 12.0–15.0)
MCH: 28.8 pg (ref 26.0–34.0)
MCHC: 32 g/dL (ref 30.0–36.0)
MCV: 90.1 fL (ref 80.0–100.0)
Platelets: 257 10*3/uL (ref 150–400)
RBC: 2.74 MIL/uL — ABNORMAL LOW (ref 3.87–5.11)
RDW: 16.9 % — ABNORMAL HIGH (ref 11.5–15.5)
WBC: 9.6 10*3/uL (ref 4.0–10.5)
nRBC: 0 % (ref 0.0–0.2)

## 2018-07-25 LAB — BASIC METABOLIC PANEL
Anion gap: 9 (ref 5–15)
BUN: 116 mg/dL — ABNORMAL HIGH (ref 8–23)
CO2: 24 mmol/L (ref 22–32)
Calcium: 8.4 mg/dL — ABNORMAL LOW (ref 8.9–10.3)
Chloride: 107 mmol/L (ref 98–111)
Creatinine, Ser: 3.37 mg/dL — ABNORMAL HIGH (ref 0.44–1.00)
GFR calc Af Amer: 15 mL/min — ABNORMAL LOW (ref >60)
GFR calc non Af Amer: 13 mL/min — ABNORMAL LOW (ref >60)
Glucose, Bld: 137 mg/dL — ABNORMAL HIGH (ref 70–99)
Potassium: 4.9 mmol/L (ref 3.5–5.1)
Sodium: 140 mmol/L (ref 135–145)

## 2018-07-25 LAB — TYPE AND SCREEN
ABO/RH(D): B POS
Antibody Screen: NEGATIVE
Unit division: 0

## 2018-07-25 LAB — FERRITIN: Ferritin: 165 ng/mL (ref 11–307)

## 2018-07-25 LAB — BPAM RBC
Blood Product Expiration Date: 202007202359
ISSUE DATE / TIME: 202007140001
Unit Type and Rh: 1700

## 2018-07-25 LAB — GLUCOSE, CAPILLARY
Glucose-Capillary: 95 mg/dL (ref 70–99)
Glucose-Capillary: 81 mg/dL (ref 70–99)
Glucose-Capillary: 76 mg/dL (ref 70–99)
Glucose-Capillary: 168 mg/dL — ABNORMAL HIGH (ref 70–99)
Glucose-Capillary: 226 mg/dL — ABNORMAL HIGH (ref 70–99)

## 2018-07-25 LAB — VITAMIN B12: Vitamin B-12: 2314 pg/mL — ABNORMAL HIGH (ref 180–914)

## 2018-07-25 LAB — UREA NITROGEN, URINE: Urea Nitrogen, Ur: 544 mg/dL

## 2018-07-25 SURGERY — ESOPHAGOGASTRODUODENOSCOPY (EGD) WITH PROPOFOL
Anesthesia: Monitor Anesthesia Care

## 2018-07-25 MED ORDER — LACTATED RINGERS IV SOLN
INTRAVENOUS | Status: AC | PRN
  Administered 2018-07-25: 1000 mL via INTRAVENOUS

## 2018-07-25 MED ORDER — PROPOFOL 500 MG/50ML IV EMUL
INTRAVENOUS | Status: DC | PRN
  Administered 2018-07-25: 50 ug/kg/min via INTRAVENOUS

## 2018-07-25 MED ORDER — PROPOFOL 10 MG/ML IV BOLUS
INTRAVENOUS | Status: DC | PRN
  Administered 2018-07-25 (×3): 20 mg via INTRAVENOUS

## 2018-07-25 MED ORDER — SODIUM CHLORIDE 0.45 % IV SOLN
INTRAVENOUS | Status: AC
  Administered 2018-07-25 – 2018-07-26 (×3): via INTRAVENOUS

## 2018-07-25 SURGICAL SUPPLY — 15 items

## 2018-07-25 NOTE — Op Note (Signed)
[facility] Patient Name: [name] Procedure Date : 07/25/2018 MRN: [id number] Attending MD: Gatha Mayer , MD Date of Birth: April 25, 1940 CSN: [id number] Age: 78 Admit Type: Inpatient Procedure:                Upper GI endoscopy Indications:              Heme positive stool decreased Hgb in chronic anemia Providers:                Gatha Mayer, MD, Grace Isaac, RN, Cherylynn Ridges, Technician, Wyatt Haste Referring MD:              Medicines:                Propofol per Anesthesia, Monitored Anesthesia Care Complications:            No immediate complications. Estimated Blood Loss:     Estimated blood loss: none. Procedure:                Pre-Anesthesia Assessment:                           - Prior to the procedure, a History and Physical                            was performed, and patient medications and                            allergies were reviewed. The patient's tolerance of                            previous anesthesia was also reviewed. The risks                            and benefits of the procedure and the sedation                            options and risks were discussed with the patient.                            All questions were answered, and informed consent                            was obtained. Prior Anticoagulants: The patient has                            taken no previous anticoagulant or antiplatelet                            agents. ASA Grade Assessment: IV - A patient with                            severe systemic disease that is a constant threat  to life. After reviewing the risks and benefits,                            the patient was deemed in satisfactory condition to                            undergo the procedure.                           After obtaining informed consent, the endoscope was                            passed under direct vision. Throughout the                             procedure, the patient's blood pressure, pulse, and                            oxygen saturations were monitored continuously. The                            GIF-H190 ([id number]) Olympus gastroscope was                            introduced through the mouth, and advanced to the                            second part of duodenum. The upper GI endoscopy was                            accomplished without difficulty. The patient                            tolerated the procedure well. Scope In: Scope Out: Findings:      The esophagus was normal.      The stomach was normal.      The examined duodenum was normal.      The cardia and gastric fundus were normal on retroflexion. Impression:               - Normal esophagus.                           - Normal stomach.                           - Normal examined duodenum.                           - No specimens collected. Recommendation:           - Return patient to hospital ward for ongoing care.                           - Diabetic (ADA) diet.                           -  She is not an appropriate candidaet for a                            colonoscopy.                           Her anemia must be multifactorial. She had a                            ferritin < 100 last year and it could be low.                           I checked it (added to today's labs) knowing that                            it might be spuriously high after transfusion but                            if low reliable and if so would do feraheme.                           Also believe heme + could be sacral decub related.                           No new recommendations or plans for GI evaluation.                           Call us back if ?                           Thanks Procedure Code(s):        --- Professional ---                           [phone], Esophagogastroduodenoscopy, flexible,                            transoral; diagnostic,  including collection of                            specimen(s) by brushing or washing, when performed                            (separate procedure) Diagnosis Code(s):        --- Professional ---                           R19.5, Other fecal abnormalities CPT copyright 2019 American Medical Association. All rights reserved. The codes documented in this report are preliminary and upon coder review may  be revised to meet current compliance requirements. Gatha Mayer, MD 07/25/2018 10:10:46 AM This report has been signed electronically. Number of Addenda: 0

## 2018-07-25 NOTE — Anesthesia Postprocedure Evaluation (Signed)
Anesthesia Post Note  Patient: [name]  Procedure(s) Performed: ESOPHAGOGASTRODUODENOSCOPY (EGD) WITH PROPOFOL (N/A )     Patient location during evaluation: PACU Anesthesia Type: MAC Level of consciousness: awake and alert Pain management: pain level controlled Vital Signs Assessment: post-procedure vital signs reviewed and stable Respiratory status: spontaneous breathing, nonlabored ventilation, respiratory function stable and patient connected to nasal cannula oxygen Cardiovascular status: stable and blood pressure returned to baseline Anesthetic complications: no    Last Vitals:  Vitals:   07/25/18 1000 07/25/18 1026  BP: (!) 149/55 135/82  Pulse: 77   Resp: (!) 23   Temp:    SpO2: 98%                    Audry Pili

## 2018-07-25 NOTE — Interval H&P Note (Signed)
History and Physical Interval Note:  07/25/2018 9:21 AM  [name]  has presented today for surgery, with the diagnosis of anemia.  FOBT +.  The various methods of treatment have been discussed with the patient and family. After consideration of risks, benefits and other options for treatment, the patient has consented to  Procedure(s): ESOPHAGOGASTRODUODENOSCOPY (EGD) WITH PROPOFOL (N/A) as a surgical intervention.  The patient's history has been reviewed, patient examined, no change in status, stable for surgery.  I have reviewed the patient's chart and labs.  Questions were answered to the patient's satisfaction.     Silvano Rusk

## 2018-07-25 NOTE — Transfer of Care (Signed)
Immediate Anesthesia Transfer of Care Note  Patient: [name]  Procedure(s) Performed: ESOPHAGOGASTRODUODENOSCOPY (EGD) WITH PROPOFOL (N/A )  Patient Location: Endoscopy Unit  Anesthesia Type:MAC  Level of Consciousness: drowsy and patient cooperative  Airway & Oxygen Therapy: Patient Spontanous Breathing and Patient connected to nasal cannula oxygen  Post-op Assessment: Report given to RN, Post -op Vital signs reviewed and stable and Patient moving all extremities X 4  Post vital signs: Reviewed and stable  Last Vitals:  Vitals Value Taken Time  BP    Temp    Pulse    Resp    SpO2      Last Pain:  Vitals:   07/25/18 0809  TempSrc: Temporal  PainSc: 0-No pain         Complications: No apparent anesthesia complications

## 2018-07-25 NOTE — Progress Notes (Signed)
PROGRESS NOTE  [name] PYK:[id number] DOB: 07/29/1940 DOA: 07/23/2018 PCP: Burnard Bunting, MD  Brief History   79 year old woman PMH COPD on oxygen, CKD stage IV, chronic combined systolic, diastolic CHF, peptic ulcer disease, diabetes mellitus type 2 presented for evaluation of low hemoglobin.  Recently admitted for acute on chronic kidney disease stage IV secondary to overdiuresis.  Routine labs at [facility] revealed a hemoglobin of 6.5.  Fecal occult blood was positive.  Patient was started on Protonix infusion and admitted for further evaluation.  Admitted for further evaluation of anemia.  A & P  Acute on chronic anemia, multifactorial, not iron deficient, upper GI bleed source suspected.  Fecal occult blood test positive.  Remote history of reflux esophagitis, not currently on PPI.  PMH hyperplastic and adenomatous colon polyps. --Appears clinically stable. -- Status post 1 unit PRBC --Trend hemoglobin. --Patient underwent EGD that did not show any significant signs of bleeding. --Continue Protonix -Since patient is going to receive IV hydration, she may have further hemodilution her hemoglobin and may require further transfusion of PRBC.  Repeat labs in a.m.  Since she needs continued monitoring of hemoglobin and renal function, she is not stable for discharge at this point.  Diabetes mellitus type 2 --CBG stable.  Continue Lantus, sliding scale insulin  AKI on CKD stage IV --Creatinine trending up, possibly related to diuresis.  Will hold further diuretics and provide gentle hydration.  Follow serum creatinine in a.m.  Chronic combined systolic and diastolic CHF --Appears stable with insignificant lower extremity edema.  Patient currently lying flat in bed without difficulty breathing. --Hold diuretics since creatinine is trending up  COPD with chronic hypoxic respiratory failure on 3 L nasal cannula --Appears stable.  Bilateral heel pressure injuries,  sacral decubitus --Continue management as per wound care RN  Aortic atherosclerosis --no inpatient evaluation planned.  DVT prophylaxis: SCDs Code Status: Full Family Communication: none Disposition Plan: return to [place], [place]  [facility] Direct contact: see [email or web address] (further directions at bottom of note if needed) 7PM-7AM contact night coverage as at bottom of note 07/25/2018, 8:16 PM  LOS: 0 days   Consultants  . Gastroenterology  Procedures     Antibiotics  .   Interval History/Subjective  Patient seen in room after procedure.  She is still groggy.  Denies any pain or shortness of breath.  Objective   Vitals:  Vitals:   07/25/18 1026 07/25/18 1445  BP: 135/82 (!) 155/64  Pulse:  63  Resp:  16  Temp:  98.3 F (36.8 C)  SpO2: 100% 98%    Exam:  General exam: Alert, awake, oriented x 3 Respiratory system: Clear to auscultation. Respiratory effort normal. Cardiovascular system:RRR. No murmurs, rubs, gallops. Gastrointestinal system: Abdomen is nondistended, soft and nontender. No organomegaly or masses felt. Normal bowel sounds heard. Central nervous system: Alert and oriented. No focal neurological deficits. Extremities: No C/C/E, +pedal pulses Skin: darkened skin in legs bilaterally o Psychiatry: Judgement and insight appear normal. Mood & affect appropriate.  o   I have personally reviewed the following:   Today's Data    Lab Data  . Urinalysis negative . SARS-CoV-2 negative  Micro Data  .   Imaging  . Chest x-ray no acute disease  Cardiology Data  . EKG independently reviewed sinus rhythm, nonspecific ST changes.  No acute changes seen.  Other Data  .   Scheduled Meds: . amLODipine  10 mg Oral Daily  .  calcitRIOL  0.25 mcg Oral Q M,W,F  . calcium-vitamin D  1 tablet Oral Daily  . folic acid  0.5 mg Oral Daily  . insulin aspart  0-9 Units Subcutaneous TID WC  . insulin glargine  20 Units  Subcutaneous QHS  . montelukast  10 mg Oral QHS  . pantoprazole  40 mg Oral Q0600  . simvastatin  20 mg Oral Daily  . sodium chloride flush  3 mL Intravenous Q12H  . vitamin B-12  1,000 mcg Oral Daily   Continuous Infusions: . sodium chloride 100 mL/hr at 07/25/18 1533    Principal Problem:   Anemia due to GI blood loss Active Problems:   Anemia of chronic disease   Chronic obstructive pulmonary disease (HCC)   CKD (chronic kidney disease) stage 4, GFR 15-29 ml/min (HCC)   Type 2 diabetes mellitus (HCC)   Essential hypertension   CHF (congestive heart failure), NYHA class IV, chronic, combined ([place])   Pressure injury of skin   Occult blood positive stool   Aortic atherosclerosis ([place])   LOS: 0 days   How to contact the [facility] Attending or Consulting provider 7A - 7P or covering provider during after hours 7P -7A, for this patient?  1. Check the care team in [facility] and look for a) attending/consulting TRH provider listed and b) the [facility] team listed 2. Log into [email or web address] and use [place]'s universal password to access. If you do not have the password, please contact the hospital operator. 3. Locate the [facility] provider you are looking for under [facility] and page to a number that you can be directly reached. 4. If you still have difficulty reaching the provider, please page the [facility] (Director on Call) for the Hospitalists listed on amion for assistance.

## 2018-07-26 ENCOUNTER — Encounter (HOSPITAL_COMMUNITY): Payer: Self-pay | Admitting: Internal Medicine

## 2018-07-26 DIAGNOSIS — R195 Other fecal abnormalities: Secondary | ICD-10-CM

## 2018-07-26 LAB — CBC
HCT: 23.7 % — ABNORMAL LOW (ref 36.0–46.0)
Hemoglobin: 7.5 g/dL — ABNORMAL LOW (ref 12.0–15.0)
MCH: 29.2 pg (ref 26.0–34.0)
MCHC: 31.6 g/dL (ref 30.0–36.0)
MCV: 92.2 fL (ref 80.0–100.0)
Platelets: 242 10*3/uL (ref 150–400)
RBC: 2.57 MIL/uL — ABNORMAL LOW (ref 3.87–5.11)
RDW: 16.9 % — ABNORMAL HIGH (ref 11.5–15.5)
WBC: 10 10*3/uL (ref 4.0–10.5)
nRBC: 0 % (ref 0.0–0.2)

## 2018-07-26 LAB — GLUCOSE, CAPILLARY
Glucose-Capillary: 61 mg/dL — ABNORMAL LOW (ref 70–99)
Glucose-Capillary: 112 mg/dL — ABNORMAL HIGH (ref 70–99)
Glucose-Capillary: 122 mg/dL — ABNORMAL HIGH (ref 70–99)
Glucose-Capillary: 134 mg/dL — ABNORMAL HIGH (ref 70–99)
Glucose-Capillary: 180 mg/dL — ABNORMAL HIGH (ref 70–99)

## 2018-07-26 LAB — BASIC METABOLIC PANEL
Anion gap: 11 (ref 5–15)
BUN: 105 mg/dL — ABNORMAL HIGH (ref 8–23)
CO2: 21 mmol/L — ABNORMAL LOW (ref 22–32)
Calcium: 8.3 mg/dL — ABNORMAL LOW (ref 8.9–10.3)
Chloride: 108 mmol/L (ref 98–111)
Creatinine, Ser: 3.08 mg/dL — ABNORMAL HIGH (ref 0.44–1.00)
GFR calc Af Amer: 16 mL/min — ABNORMAL LOW (ref >60)
GFR calc non Af Amer: 14 mL/min — ABNORMAL LOW (ref >60)
Glucose, Bld: 138 mg/dL — ABNORMAL HIGH (ref 70–99)
Potassium: 5.1 mmol/L (ref 3.5–5.1)
Sodium: 140 mmol/L (ref 135–145)

## 2018-07-26 MED ORDER — SODIUM CHLORIDE 0.45 % IV SOLN
INTRAVENOUS | Status: DC
  Administered 2018-07-26: 23:00:00 via INTRAVENOUS

## 2018-07-26 NOTE — Progress Notes (Signed)
          Daily Rounding Note  07/26/2018, 10:35 AM  LOS: 1 day   SUBJECTIVE:   Chief complaint: anemia   Feels ok.  Wondering when she will discharge.   Not enthusiastic about food in hospital but eating ok.    OBJECTIVE:         Vital signs in last 24 hours:    Temp:  [97.9 F (36.6 C)-98.8 F (37.1 C)] 97.9 F (36.6 C) (07/16 0529) Pulse Rate:  [60-67] 60 (07/16 0529) Resp:  [15-16] 15 (07/16 0529) BP: (155-167)/(61-99) 167/99 (07/16 0529) SpO2:  [93 %-100 %] 100 % (07/16 0529) Last BM Date: 07/25/18 Filed Weights   07/24/18 0425 07/25/18 0434 07/25/18 0809  Weight: 73.5 kg 75 kg 75 kg   General: weak, obese, comfortable   Heart: RRR Chest: SOB when speaking Abdomen: soft, obese, NT.  Active BS  Extremities: bullous stasis dermatitis, swelling in LE bil.   Neuro/Psych:  Alert, appropriate.  Oriented x 3.    Intake/Output from previous day: 07/15 0701 - 07/16 0700 In: 1125.9 [P.O.:65; I.V.:1060.9] Out: 652 [Urine:651; Stool:1]  Intake/Output this shift: Total I/O In: 240 [P.O.:240] Out: -   Lab Results: Recent Labs    07/24/18 1819 07/25/18 0335 07/26/18 0320  WBC 9.3 9.6 10.0  HGB 8.5* 7.9* 7.5*  HCT 27.0* 24.7* 23.7*  PLT 261 257 242   BMET Recent Labs    07/24/18 0602 07/25/18 0335 07/26/18 0320  NA 139 140 140  K 4.5 4.9 5.1  CL 106 107 108  CO2 23 24 21*  GLUCOSE 138* 137* 138*  BUN 117* 116* 105*  CREATININE 2.91* 3.37* 3.08*  CALCIUM 8.3* 8.4* 8.3*   LFT Recent Labs    07/23/18 1521 07/24/18 0602  PROT 6.9  --   ALBUMIN 2.7* 2.5*  AST 13*  --   ALT 16  --   ALKPHOS 76  --   BILITOT 0.5  --    PT/INR Recent Labs    07/23/18 1609  LABPROT 15.8*  INR 1.3*     ASSESMENT:   *   FOBT positive.  Chronic anemia with Hgb at/near baseline over 12 months.. 07/25/2018 EGD.  Study normal. Heme positive stool may be due to sacral decubitus. Multifactorial anemia. Received 1  PRBC on 7/14 Ferritin 165.  Not B12 defocient.     PLAN   *    No plans for further GI work up.  Will sign off.       [name]  07/26/2018, 10:35 AM Phone [phone]

## 2018-07-26 NOTE — Plan of Care (Signed)

## 2018-07-26 NOTE — Progress Notes (Signed)
PROGRESS NOTE  [name] HYI:[id number] DOB: [birth year] DOA: 07/23/2018 PCP: Burnard Bunting, MD  Brief History   78 year old woman PMH COPD on oxygen, CKD stage IV, chronic combined systolic, diastolic CHF, peptic ulcer disease, diabetes mellitus type 2 presented for evaluation of low hemoglobin.  Recently admitted for acute on chronic kidney disease stage IV secondary to overdiuresis.  Routine labs at [facility] revealed a hemoglobin of 6.5.  Fecal occult blood was positive.  Patient was started on Protonix infusion and admitted for further evaluation.  Admitted for further evaluation of anemia.  A & P  Acute on chronic anemia, multifactorial, not iron deficient, upper GI bleed source suspected.  Fecal occult blood test positive.  Remote history of reflux esophagitis, not currently on PPI.  PMH hyperplastic and adenomatous colon polyps. --Appears clinically stable. -- Status post 1 unit PRBC --Trend hemoglobin. --Patient underwent EGD that did not show any significant signs of bleeding. --Continue Protonix -Since patient is continuing to receive IV hydration, she may have further hemodilution in her hemoglobin and may require further transfusion of PRBC.  Repeat labs in a.m.  Since she needs continued monitoring of hemoglobin and renal function, she is not stable for discharge at this point.  Diabetes mellitus type 2 --CBG stable.  Continue Lantus, sliding scale insulin  AKI on CKD stage IV --Mild improvement of creatinine with IV hydration.  We will continue to hold diuretics for today and provide gentle hydration.  Recheck labs in a.m.Marland Kitchen  Chronic combined systolic and diastolic CHF --Appears stable with insignificant lower extremity edema.  Patient currently lying flat in bed without difficulty breathing. --Hold diuretics since creatinine was trending up  COPD with chronic hypoxic respiratory failure on 3 L nasal cannula --Appears stable.  Bilateral heel pressure  injuries, sacral decubitus --Continue management as per wound care RN  Aortic atherosclerosis --no inpatient evaluation planned.  DVT prophylaxis: SCDs Code Status: Full Family Communication: none Disposition Plan: return to [place], [place]  [facility] Direct contact: see [email or web address] (further directions at bottom of note if needed) 7PM-7AM contact night coverage as at bottom of note 07/26/2018, 7:35 PM  LOS: 1 day   Consultants  . Gastroenterology  Procedures     Antibiotics  .   Interval History/Subjective  Feeling a lot better today.  Denies any shortness of breath.  Does not have any abdominal pain.  Objective   Vitals:  Vitals:   07/26/18 0529 07/26/18 1256  BP: (!) 167/99 (!) 153/62  Pulse: 60 63  Resp: 15 17  Temp: 97.9 F (36.6 C) 98.2 F (36.8 C)  SpO2: 100% 95%    Exam:  General exam: Alert, awake, oriented x 3 Respiratory system: Clear to auscultation. Respiratory effort normal. Cardiovascular system:RRR. No murmurs, rubs, gallops. Gastrointestinal system: Abdomen is nondistended, soft and nontender. No organomegaly or masses felt. Normal bowel sounds heard. Central nervous system: Alert and oriented. No focal neurological deficits. Extremities: 1+ edema b/l Skin: No rashes, lesions or ulcers o Psychiatry: Judgement and insight appear normal. Mood & affect appropriate.  o  o   I have personally reviewed the following:   Today's Data    Lab Data  . Urinalysis negative . SARS-CoV-2 negative  Micro Data  .   Imaging  . Chest x-ray no acute disease  Cardiology Data  . EKG independently reviewed sinus rhythm, nonspecific ST changes.  No acute changes seen.  Other Data  .   Scheduled Meds: .  amLODipine  10 mg Oral Daily  . calcitRIOL  0.25 mcg Oral Q M,W,F  . calcium-vitamin D  1 tablet Oral Daily  . folic acid  0.5 mg Oral Daily  . insulin aspart  0-9 Units Subcutaneous TID WC  . insulin glargine   20 Units Subcutaneous QHS  . montelukast  10 mg Oral QHS  . pantoprazole  40 mg Oral Q0600  . simvastatin  20 mg Oral Daily  . sodium chloride flush  3 mL Intravenous Q12H  . vitamin B-12  1,000 mcg Oral Daily   Continuous Infusions:   Principal Problem:   Anemia due to GI blood loss Active Problems:   Anemia of chronic disease   Chronic obstructive pulmonary disease (HCC)   CKD (chronic kidney disease) stage 4, GFR 15-29 ml/min (HCC)   Type 2 diabetes mellitus (HCC)   Essential hypertension   CHF (congestive heart failure), NYHA class IV, chronic, combined ([place])   Pressure injury of skin   Occult blood positive stool   Aortic atherosclerosis ([place])   LOS: 1 day   How to contact the [facility] Attending or Consulting provider 7A - 7P or covering provider during after hours 7P -7A, for this patient?  1. Check the care team in [facility] and look for a) attending/consulting TRH provider listed and b) the [facility] team listed 2. Log into [email or web address] and use [place]'s universal password to access. If you do not have the password, please contact the hospital operator. 3. Locate the [facility] provider you are looking for under [facility] and page to a number that you can be directly reached. 4. If you still have difficulty reaching the provider, please page the [facility] (Director on Call) for the Hospitalists listed on amion for assistance.

## 2018-07-27 ENCOUNTER — Inpatient Hospital Stay (HOSPITAL_COMMUNITY): Payer: Medicare Other

## 2018-07-27 LAB — BASIC METABOLIC PANEL
Anion gap: 9 (ref 5–15)
BUN: 88 mg/dL — ABNORMAL HIGH (ref 8–23)
CO2: 21 mmol/L — ABNORMAL LOW (ref 22–32)
Calcium: 8.5 mg/dL — ABNORMAL LOW (ref 8.9–10.3)
Chloride: 109 mmol/L (ref 98–111)
Creatinine, Ser: 2.46 mg/dL — ABNORMAL HIGH (ref 0.44–1.00)
GFR calc Af Amer: 21 mL/min — ABNORMAL LOW (ref >60)
GFR calc non Af Amer: 18 mL/min — ABNORMAL LOW (ref >60)
Glucose, Bld: 129 mg/dL — ABNORMAL HIGH (ref 70–99)
Potassium: 4.6 mmol/L (ref 3.5–5.1)
Sodium: 139 mmol/L (ref 135–145)

## 2018-07-27 LAB — BLOOD GAS, ARTERIAL
Acid-base deficit: 1.3 mmol/L (ref 0.0–2.0)
Bicarbonate: 23.7 mmol/L (ref 20.0–28.0)
Delivery systems: POSITIVE
Drawn by: 347191
Expiratory PAP: 10
FIO2: 40
Inspiratory PAP: 20
O2 Saturation: 82.9 %
Patient temperature: 99
RATE: 8 resp/min
pCO2 arterial: 46.1 mmHg (ref 32.0–48.0)
pH, Arterial: 7.334 — ABNORMAL LOW (ref 7.350–7.450)
pO2, Arterial: 53 mmHg — ABNORMAL LOW (ref 83.0–108.0)

## 2018-07-27 LAB — GLUCOSE, CAPILLARY
Glucose-Capillary: 119 mg/dL — ABNORMAL HIGH (ref 70–99)
Glucose-Capillary: 77 mg/dL (ref 70–99)
Glucose-Capillary: 146 mg/dL — ABNORMAL HIGH (ref 70–99)
Glucose-Capillary: 144 mg/dL — ABNORMAL HIGH (ref 70–99)

## 2018-07-27 LAB — CBC
HCT: 25.7 % — ABNORMAL LOW (ref 36.0–46.0)
Hemoglobin: 8.1 g/dL — ABNORMAL LOW (ref 12.0–15.0)
MCH: 28.6 pg (ref 26.0–34.0)
MCHC: 31.5 g/dL (ref 30.0–36.0)
MCV: 90.8 fL (ref 80.0–100.0)
Platelets: 247 10*3/uL (ref 150–400)
RBC: 2.83 MIL/uL — ABNORMAL LOW (ref 3.87–5.11)
RDW: 16.4 % — ABNORMAL HIGH (ref 11.5–15.5)
WBC: 10 10*3/uL (ref 4.0–10.5)
nRBC: 0 % (ref 0.0–0.2)

## 2018-07-27 MED ORDER — FUROSEMIDE 10 MG/ML IJ SOLN
40.0000 mg | Freq: Once | INTRAMUSCULAR | Status: AC
  Administered 2018-07-27: 40 mg via INTRAVENOUS
  Filled 2018-07-27: qty 4

## 2018-07-27 MED ORDER — TORSEMIDE 20 MG PO TABS
20.0000 mg | ORAL_TABLET | Freq: Every day | ORAL | Status: DC
  Administered 2018-07-27 – 2018-07-30 (×4): 20 mg via ORAL
  Filled 2018-07-27 (×4): qty 1

## 2018-07-27 MED ORDER — FUROSEMIDE 10 MG/ML IJ SOLN
40.0000 mg | Freq: Once | INTRAMUSCULAR | Status: AC
  Administered 2018-07-27: 04:00:00 40 mg via INTRAVENOUS
  Filled 2018-07-27: qty 4

## 2018-07-27 NOTE — Progress Notes (Signed)
Pt went into respiratory distress, wheezing in all lobes, dyspnea at rest and oxygen sat at 70% on 3 L. IV fluid placed on hold. Neb treatment given with no reliev. Non-rebreather mask applied with some improvement in oxygen to 96%.  Respiratory called and at bedside.  NP notified and an order for lasix, chest X-ray, bipap, and PC monitor received.  Bi-Pap applied and patient work of breathing greatly improved as well as saturation of 100%.  She is calming down.  Will continue to monitor.

## 2018-07-27 NOTE — Progress Notes (Addendum)
RT called to pt room for respiratory distress. Pt has had 3 albuterol treatments since 1900 without relief per RN. Pt was placed on BIPAP V60 for WOB with settings of 20/10 BUR 8 FIO2 40%. Pt RR was at 48 prior to placement on BIPAP. Pt RR has begun to decrease some at this time. Pt states it is helping her breathing. MD was contacted and order was placed for lasix. Pt has a hx of diastolic and systolic CHF. RT obtained ABG on pt post placement with the following results. RT will continue to monitor.   Results for [name], [name] (MRN [id number]) as of 07/27/2018 05:03  Ref. Range 07/27/2018 04:21  Sample type Unknown ARTERIAL DRAW  Delivery systems Unknown BILEVEL POSITIVE AIRWAY PRESSURE  FIO2 Unknown 40.00  Inspiratory PAP Unknown 20  Expiratory PAP Unknown 10  pH, Arterial Latest Ref Range: 7.350 - 7.450  7.334 (L)  pCO2 arterial Latest Ref Range: 32.0 - 48.0 mmHg 46.1  pO2, Arterial Latest Ref Range: 83.0 - 108.0 mmHg 53.0 (L)  Acid-base deficit Latest Ref Range: 0.0 - 2.0 mmol/L 1.3  Bicarbonate Latest Ref Range: 20.0 - 28.0 mmol/L 23.7  O2 Saturation Latest Units: % 82.9  Patient temperature Unknown 99.0  Collection site Unknown LEFT RADIAL  Allens test (pass/fail) Latest Ref Range: PASS  PASS

## 2018-07-27 NOTE — Progress Notes (Signed)
Removed BIPAP patient immediately started yelling out "Thank you Jesus and crying". Placed on 4L [place] tolerated well oxygen sat 92-94%. Sips of water without nausea. Vitals stable. Encouraged to use I/S.

## 2018-07-27 NOTE — Progress Notes (Signed)
PROGRESS NOTE  [name] VOJ:[id number] DOB: 05-10-1940 DOA: 07/23/2018 PCP: Burnard Bunting, MD  Brief History   78 year old woman PMH COPD on oxygen, CKD stage IV, chronic combined systolic, diastolic CHF, peptic ulcer disease, diabetes mellitus type 2 presented for evaluation of low hemoglobin.  Recently admitted for acute on chronic kidney disease stage IV secondary to overdiuresis.  Routine labs at [facility] revealed a hemoglobin of 6.5.  Fecal occult blood was positive.  Patient was started on Protonix infusion and admitted for further evaluation.  Admitted for further evaluation of anemia.  A & P  Acute on chronic anemia, multifactorial, not iron deficient, upper GI bleed source suspected.  Fecal occult blood test positive.  Remote history of reflux esophagitis, not currently on PPI.  PMH hyperplastic and adenomatous colon polyps. --Appears clinically stable. -- Status post 1 unit PRBC --Trend hemoglobin. --Patient underwent EGD that did not show any significant signs of bleeding. --Continue Protonix  Diabetes mellitus type 2 --CBG stable.  Continue Lantus, sliding scale insulin  AKI on CKD stage IV --Improvement of creatinine with IV hydration.  Unfortunately, she did develop decompensated CHF.  Will be started on torsemide and stop IV fluids.  Acute on chronic combined systolic and diastolic CHF --Likely precipitated by IV fluids which she was receiving for renal failure.  IV fluids have since been discontinued and she received 2 doses of intravenous Lasix.  Urine output responded appropriately and overall respiratory status is improving.  Will restart on torsemide.  Acute on chronic respiratory failure with hypoxia -Secondary to decompensated CHF.  Briefly required BiPAP, but is now back on nasal cannula.  COPD with chronic hypoxic respiratory failure on 3 L nasal cannula --Appears stable.  Bilateral heel pressure injuries, sacral decubitus --Continue  management as per wound care RN  Aortic atherosclerosis --no inpatient evaluation planned.  DVT prophylaxis: SCDs Code Status: Full Family Communication: none Disposition Plan: return to [place], [place]  [facility] Direct contact: see [email or web address] (further directions at bottom of note if needed) 7PM-7AM contact night coverage as at bottom of note 07/27/2018, 7:34 PM  LOS: 2 days   Consultants  . Gastroenterology  Procedures     Antibiotics  .   Interval History/Subjective  Patient became short of breath overnight.  She was noted to be hypoxic and was placed on BiPAP.  Chest x-ray indicated CHF.  Objective   Vitals:  Vitals:   07/27/18 1244 07/27/18 1433  BP: (!) 123/53 (!) 177/90  Pulse: (!) 56   Resp: 20   Temp: 97.8 F (36.6 C)   SpO2: 100% 92%    Exam: General exam: Somnolent, wakes up to voice, currently on BiPAP Respiratory system: Diminished breath sounds at bases. Respiratory effort normal. Cardiovascular system:RRR. No murmurs, rubs, gallops. Gastrointestinal system: Abdomen is nondistended, soft and nontender. No organomegaly or masses felt. Normal bowel sounds heard. Central nervous system: No focal neurological deficits. Extremities: 1+ edema bilaterally Skin: No rashes, lesions or ulcers Psychiatry: Unable to assess due to BiPAP mask   I have personally reviewed the following:   Today's Data    Lab Data  . Urinalysis negative . SARS-CoV-2 negative  Micro Data  .   Imaging  . Chest x-ray shows CHF  Cardiology Data  . EKG independently reviewed sinus rhythm, nonspecific ST changes.  No acute changes seen.  Other Data  .   Scheduled Meds: . amLODipine  10 mg Oral Daily  . calcitRIOL  0.25 mcg Oral Q M,W,F  . calcium-vitamin D  1 tablet Oral Daily  . folic acid  0.5 mg Oral Daily  . insulin aspart  0-9 Units Subcutaneous TID WC  . insulin glargine  20 Units Subcutaneous QHS  . montelukast  10 mg  Oral QHS  . pantoprazole  40 mg Oral Q0600  . simvastatin  20 mg Oral Daily  . sodium chloride flush  3 mL Intravenous Q12H  . vitamin B-12  1,000 mcg Oral Daily   Continuous Infusions:   Principal Problem:   Anemia due to GI blood loss Active Problems:   Anemia of chronic disease   Chronic obstructive pulmonary disease (HCC)   CKD (chronic kidney disease) stage 4, GFR 15-29 ml/min (HCC)   Type 2 diabetes mellitus (HCC)   Essential hypertension   CHF (congestive heart failure), NYHA class IV, chronic, combined ([place])   Pressure injury of skin   Occult blood positive stool   Aortic atherosclerosis ([place])   LOS: 2 days   How to contact the [facility] Attending or Consulting provider 7A - 7P or covering provider during after hours 7P -7A, for this patient?  1. Check the care team in [facility] and look for a) attending/consulting TRH provider listed and b) the [facility] team listed 2. Log into [email or web address] and use [place]'s universal password to access. If you do not have the password, please contact the hospital operator. 3. Locate the [facility] provider you are looking for under [facility] and page to a number that you can be directly reached. 4. If you still have difficulty reaching the provider, please page the [facility] (Director on Call) for the Hospitalists listed on amion for assistance.

## 2018-07-27 NOTE — Plan of Care (Signed)
  Problem: Education: Goal: Knowledge of General Education information will improve Description: Including pain rating scale, medication(s)/side effects and non-pharmacologic comfort measures Outcome: Progressing   Problem: Health Behavior/Discharge Planning: Goal: Ability to manage health-related needs will improve Outcome: Progressing   Problem: Clinical Measurements: Goal: Ability to maintain clinical measurements within normal limits will improve Outcome: Progressing Goal: Will remain free from infection Outcome: Progressing Goal: Diagnostic test results will improve Outcome: Progressing   Problem: Nutrition: Goal: Adequate nutrition will be maintained Outcome: Progressing   Problem: Elimination: Goal: Will not experience complications related to bowel motility Outcome: Progressing

## 2018-07-27 NOTE — Plan of Care (Signed)
  Problem: Education: Goal: Knowledge of General Education information will improve Description: Including pain rating scale, medication(s)/side effects and non-pharmacologic comfort measures Outcome: Progressing   Problem: Health Behavior/Discharge Planning: Goal: Ability to manage health-related needs will improve Outcome: Progressing   Problem: Clinical Measurements: Goal: Ability to maintain clinical measurements within normal limits will improve Outcome: Progressing Goal: Will remain free from infection Outcome: Progressing Goal: Diagnostic test results will improve Outcome: Progressing   Problem: Activity: Goal: Risk for activity intolerance will decrease Outcome: Progressing   Problem: Nutrition: Goal: Adequate nutrition will be maintained 07/27/2018 0237 by Aris Lot, RN Outcome: Progressing 07/27/2018 0236 by Aris Lot, RN Outcome: Progressing   Problem: Coping: Goal: Level of anxiety will decrease Outcome: Progressing   Problem: Elimination: Goal: Will not experience complications related to bowel motility 07/27/2018 0237 by Aris Lot, RN Outcome: Progressing 07/27/2018 0236 by Aris Lot, RN Outcome: Progressing Goal: Will not experience complications related to urinary retention Outcome: Progressing

## 2018-07-28 LAB — BASIC METABOLIC PANEL
Anion gap: 9 (ref 5–15)
BUN: 85 mg/dL — ABNORMAL HIGH (ref 8–23)
CO2: 23 mmol/L (ref 22–32)
Calcium: 8.8 mg/dL — ABNORMAL LOW (ref 8.9–10.3)
Chloride: 108 mmol/L (ref 98–111)
Creatinine, Ser: 2.46 mg/dL — ABNORMAL HIGH (ref 0.44–1.00)
GFR calc Af Amer: 21 mL/min — ABNORMAL LOW (ref >60)
GFR calc non Af Amer: 18 mL/min — ABNORMAL LOW (ref >60)
Glucose, Bld: 84 mg/dL (ref 70–99)
Potassium: 5 mmol/L (ref 3.5–5.1)
Sodium: 140 mmol/L (ref 135–145)

## 2018-07-28 LAB — GLUCOSE, CAPILLARY
Glucose-Capillary: 58 mg/dL — ABNORMAL LOW (ref 70–99)
Glucose-Capillary: 133 mg/dL — ABNORMAL HIGH (ref 70–99)
Glucose-Capillary: 186 mg/dL — ABNORMAL HIGH (ref 70–99)
Glucose-Capillary: 147 mg/dL — ABNORMAL HIGH (ref 70–99)

## 2018-07-28 MED ORDER — FUROSEMIDE 10 MG/ML IJ SOLN
40.0000 mg | Freq: Once | INTRAMUSCULAR | Status: AC
  Administered 2018-07-28: 40 mg via INTRAVENOUS
  Filled 2018-07-28: qty 4

## 2018-07-28 MED ORDER — INSULIN GLARGINE 100 UNIT/ML ~~LOC~~ SOLN
15.0000 [IU] | Freq: Every day | SUBCUTANEOUS | Status: DC
  Administered 2018-07-28 – 2018-08-01 (×5): 15 [IU] via SUBCUTANEOUS
  Filled 2018-07-28 (×6): qty 0.15

## 2018-07-28 NOTE — Progress Notes (Signed)
PROGRESS NOTE  [name] ZOX:[id number] DOB: 1940/12/26 DOA: 07/23/2018 PCP: Burnard Bunting, MD  Brief History   78 year old woman PMH COPD on oxygen, CKD stage IV, chronic combined systolic, diastolic CHF, peptic ulcer disease, diabetes mellitus type 2 presented for evaluation of low hemoglobin.  Recently admitted for acute on chronic kidney disease stage IV secondary to overdiuresis.  Routine labs at [facility] revealed a hemoglobin of 6.5.  Fecal occult blood was positive.  Patient was started on Protonix infusion and admitted for further evaluation.  Admitted for further evaluation of anemia.  A & P  Acute on chronic anemia, multifactorial, not iron deficient, upper GI bleed source suspected.  Fecal occult blood test positive.  Remote history of reflux esophagitis, not currently on PPI.  PMH hyperplastic and adenomatous colon polyps. --Appears clinically stable. -- Status post 1 unit PRBC --Trend hemoglobin. --Patient underwent EGD that did not show any significant signs of bleeding. --Continue Protonix  Diabetes mellitus type 2 --CBG stable.  Continue Lantus, sliding scale insulin  AKI on CKD stage IV --Improvement of creatinine with IV hydration.  Unfortunately, she did develop decompensated CHF. Renal function currently stable. Continue to follow in the setting of diuresis. May need to tolerate a higher creatinine in order to achieve adequate volume status.  Acute on chronic combined systolic and diastolic CHF --Likely precipitated by IV fluids which she was receiving for renal failure.  IV fluids have since been discontinued and she has been receiving doses of intravenous Lasix.  Urine output responded appropriately and overall respiratory status is improving. She has been restarted on torsemide. Will give another dose of IV lasix today.  Acute on chronic respiratory failure with hypoxia -Secondary to decompensated CHF.  Briefly required BiPAP, but is now back on  nasal cannula. -patient was not using oxygen during the day prior to admission  COPD with chronic hypoxic respiratory failure on 3 L nasal cannula --Appears stable.  Bilateral heel pressure injuries, sacral decubitus --Continue management as per wound care RN  Aortic atherosclerosis --no inpatient evaluation planned.  DVT prophylaxis: SCDs Code Status: Full Family Communication: no answer when attempted to reach emergency contacts listed in chart. Will try and reach out to her niece today if she calls in. Disposition Plan: return to [place], [place]  [facility] Direct contact: see [email or web address] (further directions at bottom of note if needed) 7PM-7AM contact night coverage as at bottom of note 07/28/2018, 10:35 AM  LOS: 3 days   Consultants  . Gastroenterology  Procedures     Antibiotics  .   Interval History/Subjective  Still short of breath, but feeling a little better than yesterday. Off Bipap now but still requiring supplemental oxygen  Objective   Vitals:  Vitals:   07/28/18 0044 07/28/18 0548  BP: (!) 157/80 (!) 165/68  Pulse: 70 78  Resp:    Temp: 99.2 F (37.3 C) 98.8 F (37.1 C)  SpO2: 97% 91%    Exam: General exam: Alert, awake, oriented x 3 Respiratory system: Crackles at bases. Respiratory effort mildly increased. Cardiovascular system:RRR. No murmurs, rubs, gallops. Gastrointestinal system: Abdomen is nondistended, soft and nontender. No organomegaly or masses felt. Normal bowel sounds heard. Central nervous system: Alert and oriented. No focal neurological deficits. Extremities: 1+ edema bilaterally Skin: darkened skin of LE bilaterally Psychiatry: Judgement and insight appear normal. Mood & affect appropriate.     I have personally reviewed the following:   Today's Data  Lab Data  . Urinalysis negative . SARS-CoV-2 negative  Micro Data  .   Imaging  . Chest x-ray shows CHF  Cardiology Data  .  EKG independently reviewed sinus rhythm, nonspecific ST changes.  No acute changes seen.  Other Data  .   Scheduled Meds: . amLODipine  10 mg Oral Daily  . calcitRIOL  0.25 mcg Oral Q M,W,F  . calcium-vitamin D  1 tablet Oral Daily  . folic acid  0.5 mg Oral Daily  . furosemide  40 mg Intravenous Once  . insulin aspart  0-9 Units Subcutaneous TID WC  . insulin glargine  15 Units Subcutaneous QHS  . montelukast  10 mg Oral QHS  . pantoprazole  40 mg Oral Q0600  . simvastatin  20 mg Oral Daily  . sodium chloride flush  3 mL Intravenous Q12H  . torsemide  20 mg Oral Daily  . vitamin B-12  1,000 mcg Oral Daily   Continuous Infusions:   Principal Problem:   Anemia due to GI blood loss Active Problems:   Anemia of chronic disease   Chronic obstructive pulmonary disease (HCC)   CKD (chronic kidney disease) stage 4, GFR 15-29 ml/min (HCC)   Type 2 diabetes mellitus (HCC)   Essential hypertension   CHF (congestive heart failure), NYHA class IV, chronic, combined ([place])   Pressure injury of skin   Occult blood positive stool   Aortic atherosclerosis ([place])   LOS: 3 days   How to contact the [facility] Attending or Consulting provider 7A - 7P or covering provider during after hours 7P -7A, for this patient?  1. Check the care team in [facility] and look for a) attending/consulting TRH provider listed and b) the [facility] team listed 2. Log into [email or web address] and use [place]'s universal password to access. If you do not have the password, please contact the hospital operator. 3. Locate the [facility] provider you are looking for under [facility] and page to a number that you can be directly reached. 4. If you still have difficulty reaching the provider, please page the [facility] (Director on Call) for the Hospitalists listed on amion for assistance.

## 2018-07-28 NOTE — Progress Notes (Signed)
Pt currently not on BiPAP, will continue to monitor.

## 2018-07-29 LAB — CBC
HCT: 24.6 % — ABNORMAL LOW (ref 36.0–46.0)
Hemoglobin: 7.7 g/dL — ABNORMAL LOW (ref 12.0–15.0)
MCH: 28.7 pg (ref 26.0–34.0)
MCHC: 31.3 g/dL (ref 30.0–36.0)
MCV: 91.8 fL (ref 80.0–100.0)
Platelets: 268 10*3/uL (ref 150–400)
RBC: 2.68 MIL/uL — ABNORMAL LOW (ref 3.87–5.11)
RDW: 16.5 % — ABNORMAL HIGH (ref 11.5–15.5)
WBC: 9.4 10*3/uL (ref 4.0–10.5)
nRBC: 0 % (ref 0.0–0.2)

## 2018-07-29 LAB — BASIC METABOLIC PANEL
Anion gap: 10 (ref 5–15)
BUN: 85 mg/dL — ABNORMAL HIGH (ref 8–23)
CO2: 24 mmol/L (ref 22–32)
Calcium: 8.8 mg/dL — ABNORMAL LOW (ref 8.9–10.3)
Chloride: 107 mmol/L (ref 98–111)
Creatinine, Ser: 2.52 mg/dL — ABNORMAL HIGH (ref 0.44–1.00)
GFR calc Af Amer: 21 mL/min — ABNORMAL LOW (ref >60)
GFR calc non Af Amer: 18 mL/min — ABNORMAL LOW (ref >60)
Glucose, Bld: 169 mg/dL — ABNORMAL HIGH (ref 70–99)
Potassium: 4.6 mmol/L (ref 3.5–5.1)
Sodium: 141 mmol/L (ref 135–145)

## 2018-07-29 LAB — GLUCOSE, CAPILLARY
Glucose-Capillary: 76 mg/dL (ref 70–99)
Glucose-Capillary: 181 mg/dL — ABNORMAL HIGH (ref 70–99)
Glucose-Capillary: 143 mg/dL — ABNORMAL HIGH (ref 70–99)
Glucose-Capillary: 180 mg/dL — ABNORMAL HIGH (ref 70–99)

## 2018-07-29 MED ORDER — FUROSEMIDE 10 MG/ML IJ SOLN
40.0000 mg | Freq: Once | INTRAMUSCULAR | Status: AC
  Administered 2018-07-29: 40 mg via INTRAVENOUS
  Filled 2018-07-29: qty 4

## 2018-07-29 NOTE — Progress Notes (Signed)
PROGRESS NOTE  [name] HWE:[id number] DOB: Feb 09, 1940 DOA: 07/23/2018 PCP: Burnard Bunting, MD  Brief History   78 year old woman PMH COPD on oxygen, CKD stage IV, chronic combined systolic, diastolic CHF, peptic ulcer disease, diabetes mellitus type 2 presented for evaluation of low hemoglobin.  Recently admitted for acute on chronic kidney disease stage IV secondary to overdiuresis.  Routine labs at [facility] revealed a hemoglobin of 6.5.  Fecal occult blood was positive.  Patient was started on Protonix infusion and admitted for further evaluation.  Admitted for further evaluation of anemia.  A & P  Acute on chronic anemia, multifactorial, not iron deficient, upper GI bleed source suspected.  Fecal occult blood test positive.  Remote history of reflux esophagitis, not currently on PPI.  PMH hyperplastic and adenomatous colon polyps. --Appears clinically stable. -- Status post 1 unit PRBC --Trend hemoglobin. --Patient underwent EGD that did not show any significant signs of bleeding. --Continue Protonix  Diabetes mellitus type 2 --CBG stable.  Continue Lantus, sliding scale insulin  AKI on CKD stage IV --Improvement of creatinine with IV hydration.  Unfortunately, she did develop decompensated CHF. Renal function currently stable. Continue to follow in the setting of diuresis. May need to tolerate a higher creatinine in order to achieve adequate volume status.  Acute on chronic combined systolic and diastolic CHF --Likely precipitated by IV fluids which she was receiving for renal failure.  IV fluids have since been discontinued and she has been receiving doses of intravenous Lasix.  Urine output responded appropriately and overall respiratory status is improving. She has been restarted on torsemide.  We will continue with intravenous Lasix.  Monitor intake and output.  Acute on chronic respiratory failure with hypoxia -Secondary to decompensated CHF.  Briefly required  BiPAP, but is now back on nasal cannula.  We will try and wean off oxygen as tolerated -patient was not using oxygen during the day prior to admission  COPD with chronic hypoxic respiratory failure on 3 L nasal cannula --Appears stable.  Bilateral heel pressure injuries, sacral decubitus --Continue management as per wound care RN  Aortic atherosclerosis --no inpatient evaluation planned.  DVT prophylaxis: SCDs Code Status: Full Family Communication: no answer when attempted to reach emergency contacts listed in chart. Will try and reach out to her niece today if she calls in. Disposition Plan: return to [place], [place]  [facility] Direct contact: see [email or web address] (further directions at bottom of note if needed) 7PM-7AM contact night coverage as at bottom of note 07/29/2018, 7:41 PM  LOS: 4 days   Consultants  . Gastroenterology  Procedures     Antibiotics  .   Interval History/Subjective  Feels that her breathing is improving, although not back to baseline.  She is still short of breath in conversation.  No chest pain  Objective   Vitals:  Vitals:   07/29/18 0845 07/29/18 1208  BP: (!) 177/74 (!) 159/66  Pulse: 85 68  Resp: 16   Temp: 98.4 F (36.9 C) 98.5 F (36.9 C)  SpO2: 94% 93%    Exam: General exam: Alert, awake, oriented x 3 Respiratory system: Crackles at bases. Respiratory effort normal. Cardiovascular system:RRR. No murmurs, rubs, gallops. Gastrointestinal system: Abdomen is nondistended, soft and nontender. No organomegaly or masses felt. Normal bowel sounds heard. Central nervous system: Alert and oriented. No focal neurological deficits. Extremities: 1+ edema bilaterally Skin: Darkened skin bilaterally lower extremities Psychiatry: Judgement and insight appear normal. Mood &  affect appropriate.      I have personally reviewed the following:   Today's Data    Lab Data  . Urinalysis negative . SARS-CoV-2  negative  Micro Data  .   Imaging  . Chest x-ray shows CHF  Cardiology Data  . EKG independently reviewed sinus rhythm, nonspecific ST changes.  No acute changes seen.  Other Data  .   Scheduled Meds: . amLODipine  10 mg Oral Daily  . calcitRIOL  0.25 mcg Oral Q M,W,F  . calcium-vitamin D  1 tablet Oral Daily  . folic acid  0.5 mg Oral Daily  . insulin aspart  0-9 Units Subcutaneous TID WC  . insulin glargine  15 Units Subcutaneous QHS  . montelukast  10 mg Oral QHS  . pantoprazole  40 mg Oral Q0600  . simvastatin  20 mg Oral Daily  . sodium chloride flush  3 mL Intravenous Q12H  . torsemide  20 mg Oral Daily  . vitamin B-12  1,000 mcg Oral Daily   Continuous Infusions:   Principal Problem:   Anemia due to GI blood loss Active Problems:   Anemia of chronic disease   Chronic obstructive pulmonary disease (HCC)   CKD (chronic kidney disease) stage 4, GFR 15-29 ml/min (HCC)   Type 2 diabetes mellitus (HCC)   Essential hypertension   CHF (congestive heart failure), NYHA class IV, chronic, combined ([place])   Pressure injury of skin   Occult blood positive stool   Aortic atherosclerosis ([place])   LOS: 4 days   How to contact the [facility] Attending or Consulting provider 7A - 7P or covering provider during after hours 7P -7A, for this patient?  1. Check the care team in [facility] and look for a) attending/consulting TRH provider listed and b) the [facility] team listed 2. Log into [email or web address] and use [place]'s universal password to access. If you do not have the password, please contact the hospital operator. 3. Locate the [facility] provider you are looking for under [facility] and page to a number that you can be directly reached. 4. If you still have difficulty reaching the provider, please page the [facility] (Director on Call) for the Hospitalists listed on amion for assistance.

## 2018-07-30 LAB — CBC
HCT: 25.5 % — ABNORMAL LOW (ref 36.0–46.0)
Hemoglobin: 8.2 g/dL — ABNORMAL LOW (ref 12.0–15.0)
MCH: 29 pg (ref 26.0–34.0)
MCHC: 32.2 g/dL (ref 30.0–36.0)
MCV: 90.1 fL (ref 80.0–100.0)
Platelets: 282 10*3/uL (ref 150–400)
RBC: 2.83 MIL/uL — ABNORMAL LOW (ref 3.87–5.11)
RDW: 16.1 % — ABNORMAL HIGH (ref 11.5–15.5)
WBC: 10.5 10*3/uL (ref 4.0–10.5)
nRBC: 0 % (ref 0.0–0.2)

## 2018-07-30 LAB — BASIC METABOLIC PANEL
Anion gap: 10 (ref 5–15)
BUN: 83 mg/dL — ABNORMAL HIGH (ref 8–23)
CO2: 28 mmol/L (ref 22–32)
Calcium: 9 mg/dL (ref 8.9–10.3)
Chloride: 105 mmol/L (ref 98–111)
Creatinine, Ser: 2.36 mg/dL — ABNORMAL HIGH (ref 0.44–1.00)
GFR calc Af Amer: 22 mL/min — ABNORMAL LOW (ref >60)
GFR calc non Af Amer: 19 mL/min — ABNORMAL LOW (ref >60)
Glucose, Bld: 201 mg/dL — ABNORMAL HIGH (ref 70–99)
Potassium: 4.3 mmol/L (ref 3.5–5.1)
Sodium: 143 mmol/L (ref 135–145)

## 2018-07-30 LAB — GLUCOSE, CAPILLARY
Glucose-Capillary: 138 mg/dL — ABNORMAL HIGH (ref 70–99)
Glucose-Capillary: 137 mg/dL — ABNORMAL HIGH (ref 70–99)
Glucose-Capillary: 166 mg/dL — ABNORMAL HIGH (ref 70–99)
Glucose-Capillary: 171 mg/dL — ABNORMAL HIGH (ref 70–99)

## 2018-07-30 MED ORDER — FUROSEMIDE 10 MG/ML IJ SOLN
40.0000 mg | Freq: Two times a day (BID) | INTRAMUSCULAR | Status: DC
  Administered 2018-07-30 – 2018-08-02 (×6): 40 mg via INTRAVENOUS
  Filled 2018-07-30 (×6): qty 4

## 2018-07-30 NOTE — Progress Notes (Signed)
PROGRESS NOTE  [name] ZMO:[id number] DOB: Jan 19, 1940 DOA: 07/23/2018 PCP: Burnard Bunting, MD  Brief History   78 year old woman PMH COPD on oxygen, CKD stage IV, chronic combined systolic, diastolic CHF, peptic ulcer disease, diabetes mellitus type 2 presented for evaluation of low hemoglobin.  Recently admitted for acute on chronic kidney disease stage IV secondary to overdiuresis.  Routine labs at [facility] revealed a hemoglobin of 6.5.  Fecal occult blood was positive.  Patient was started on Protonix infusion and admitted for further evaluation.  Admitted for further evaluation of anemia.  A & P  Acute on chronic anemia, multifactorial, not iron deficient, upper GI bleed source suspected.  Fecal occult blood test positive.  Remote history of reflux esophagitis, not currently on PPI.  PMH hyperplastic and adenomatous colon polyps. --Appears clinically stable. -- Status post 1 unit PRBC --Trend hemoglobin. --Patient underwent EGD that did not show any significant signs of bleeding. --Continue Protonix  Diabetes mellitus type 2 --CBG stable.  Continue Lantus, sliding scale insulin  AKI on CKD stage IV --Improvement of creatinine with IV hydration.  Unfortunately, she did develop decompensated CHF. Renal function currently stable. Continue to follow in the setting of diuresis. May need to tolerate a higher creatinine in order to achieve adequate volume status. Creatinine is currently stable with diuresis.  Acute on chronic combined systolic and diastolic CHF --Likely precipitated by IV fluids which she was receiving for renal failure.  IV fluids have since been discontinued and she has been receiving doses of intravenous Lasix.  Urine output responded appropriately and overall respiratory status is improving. Intake and output not accurate per patient, since she frequently has urine in the bed. Overall edema is improving. We will continue with intravenous Lasix.  Monitor  intake and output.  Acute on chronic respiratory failure with hypoxia -Secondary to decompensated CHF.  Briefly required BiPAP, but is now back on nasal cannula.  We will try and wean off oxygen as tolerated -patient was not using oxygen during the day prior to admission  COPD with chronic hypoxic respiratory failure on 3 L nasal cannula --Appears stable.  Bilateral heel pressure injuries, sacral decubitus --Continue management as per wound care RN  Aortic atherosclerosis --no inpatient evaluation planned.  DVT prophylaxis: SCDs Code Status: Full Family Communication: no answer when attempted to reach emergency contacts listed in chart.  Disposition Plan: return to [facility], repeat COVID test ordered. PT eval pending.    Kathie Dike, MD  [facility] Direct contact: see [email or web address] (further directions at bottom of note if needed) 7PM-7AM contact night coverage as at bottom of note 07/30/2018, 2:04 PM  LOS: 5 days   Consultants   Gastroenterology  Procedures     Antibiotics     Interval History/Subjective  Feels that breathing is improving, but still gets short of breath during conversation. Feels that lower extremity edema improving.  Objective   Vitals:  Vitals:   07/30/18 0930 07/30/18 1222  BP: (!) 164/66 (!) 166/74  Pulse: 81 78  Resp:  16  Temp:  98.1 F (36.7 C)  SpO2: 94% 93%    Exam: General exam: Alert, awake, oriented x 3 Respiratory system: crackles at bases. Respiratory effort normal. Cardiovascular system:RRR. No murmurs, rubs, gallops. Gastrointestinal system: Abdomen is nondistended, soft and nontender. No organomegaly or masses felt. Normal bowel sounds heard. Central nervous system: Alert and oriented. No focal neurological deficits. Extremities: 1+ edema bilaterally Skin: No rashes, lesions or ulcers Psychiatry: Judgement and  insight appear normal. Mood & affect appropriate.    I have personally reviewed the  following:   Today's Data    Lab Data   Urinalysis negative  SARS-CoV-2 negative  Micro Data     Imaging   Chest x-ray shows CHF  Cardiology Data   EKG independently reviewed sinus rhythm, nonspecific ST changes.  No acute changes seen.  Other Data     Scheduled Meds:  amLODipine  10 mg Oral Daily   calcitRIOL  0.25 mcg Oral Q M,W,F   calcium-vitamin D  1 tablet Oral Daily   folic acid  0.5 mg Oral Daily   furosemide  40 mg Intravenous BID   insulin aspart  0-9 Units Subcutaneous TID WC   insulin glargine  15 Units Subcutaneous QHS   montelukast  10 mg Oral QHS   pantoprazole  40 mg Oral Q0600   simvastatin  20 mg Oral Daily   sodium chloride flush  3 mL Intravenous Q12H   vitamin B-12  1,000 mcg Oral Daily   Continuous Infusions:   Principal Problem:   Anemia due to GI blood loss Active Problems:   Anemia of chronic disease   Chronic obstructive pulmonary disease (HCC)   CKD (chronic kidney disease) stage 4, GFR 15-29 ml/min (HCC)   Type 2 diabetes mellitus ([place])   Essential hypertension   CHF (congestive heart failure), NYHA class IV, chronic, combined ([place])   Pressure injury of skin   Occult blood positive stool   Aortic atherosclerosis ([place])   LOS: 5 days   How to contact the [facility] Attending or Consulting provider 7A - 7P or covering provider during after hours 7P -7A, for this patient?  1. Check the care team in [facility] and look for a) attending/consulting TRH provider listed and b) the [facility] team listed 2. Log into [email or web address] and use [place]'s universal password to access. If you do not have the password, please contact the hospital operator. 3. Locate the [facility] provider you are looking for under [facility] and page to a number that you can be directly reached. 4. If you still have difficulty reaching the provider, please page the [facility] (Director on Call) for the Hospitalists listed on amion for assistance.

## 2018-07-30 NOTE — Care Management Important Message (Signed)
Important Message  Patient Details  Name: [name] MRN: [id number] Date of Birth: 1940-09-02   Medicare Important Message Given:  Yes     Aubreigh Fuerte 07/30/2018, 2:10 PM

## 2018-07-30 NOTE — Evaluation (Signed)
Physical Therapy Evaluation Patient Details Name: [name] MRN: [id number] DOB: February 18, 1940 Today's Date: 07/30/2018   History of Present Illness  Pt is a 78 y/o female admitted from SNF secondary to anemia; per notes, possible GI cause. PMH includes COPD, CKD, DM, HTN, and CHF.   Clinical Impression  Pt admitted secondary to problem above with deficits below. Pt requiring mod A to roll this session and total A for repositioning in bed, so pt could finish eating her lunch. Per pt she had been working with PT on transferring to [facility] using slide board at SNF, otherwise, has been using hoyer lift for transfers with other staff. Recommend return to SNF at d/c. Will continue to follow acutely to maximize functional mobility independence and safety.     Follow Up Recommendations SNF;Supervision/Assistance - 24 hour    Equipment Recommendations  None recommended by PT    Recommendations for Other Services       Precautions / Restrictions Precautions Precautions: Fall Precaution Comments: bilat heel ulcers and sacral ulcer Restrictions Weight Bearing Restrictions: No      Mobility  Bed Mobility Overal bed mobility: Needs Assistance Bed Mobility: Rolling Rolling: Mod assist         General bed mobility comments: Mod A for assist with rolling for placement of pad. Total A for repositioning in bed, so pt could be in proper position to finish eating. Further mobility deferred as pt wanting to finish lunch.   Transfers                    Ambulation/Gait                Stairs            Wheelchair Mobility    Modified Rankin (Stroke Patients Only)       Balance                                             Pertinent Vitals/Pain Pain Assessment: No/denies pain    Home Living Family/patient expects to be discharged to:: Skilled nursing facility                      Prior Function Level of Independence: Needs assistance    Gait / Transfers Assistance Needed: Reports she had been working with therapy prior to admission on performing slide board transfers and squat pivot transfers. Per previous notes, pt had been using hoyer lift for transfers when not with PT.   ADL's / Homemaking Assistance Needed: Needed assist with ADLs.         Hand Dominance        Extremity/Trunk Assessment   Upper Extremity Assessment Upper Extremity Assessment: Generalized weakness    Lower Extremity Assessment Lower Extremity Assessment: RLE deficits/detail;LLE deficits/detail RLE Deficits / Details: R heel ulcer. Feet externally rotated. No AROM noted at ankles.  LLE Deficits / Details: L heel ulcer. Feet externally rotated. No AROM noted at ankles.        Communication   Communication: No difficulties  Cognition Arousal/Alertness: Awake/alert Behavior During Therapy: WFL for tasks assessed/performed Overall Cognitive Status: Within Functional Limits for tasks assessed  General Comments      Exercises     Assessment/Plan    PT Assessment Patient needs continued PT services  PT Problem List Decreased strength;Decreased balance;Decreased mobility;Decreased knowledge of use of DME;Decreased knowledge of precautions       PT Treatment Interventions DME instruction;Functional mobility training;Therapeutic activities;Therapeutic exercise;Balance training;Patient/family education    PT Goals (Current goals can be found in the Care Plan section)  Acute Rehab PT Goals Patient Stated Goal: to be able to transfer better PT Goal Formulation: With patient Time For Goal Achievement: 08/13/18 Potential to Achieve Goals: Fair    Frequency Min 2X/week   Barriers to discharge        Co-evaluation               AM-PAC PT "6 Clicks" Mobility  Outcome Measure Help needed turning from your back to your side while in a flat bed without using bedrails?: A  Lot Help needed moving from lying on your back to sitting on the side of a flat bed without using bedrails?: A Lot Help needed moving to and from a bed to a chair (including a wheelchair)?: Total Help needed standing up from a chair using your arms (e.g., wheelchair or bedside chair)?: Total Help needed to walk in hospital room?: Total Help needed climbing 3-5 steps with a railing? : Total 6 Click Score: 8    End of Session   Activity Tolerance: Patient tolerated treatment well Patient left: in bed;with call bell/phone within reach Nurse Communication: Mobility status PT Visit Diagnosis: Difficulty in walking, not elsewhere classified (R26.2);Muscle weakness (generalized) (M62.81)    Time: 6837-2902 PT Time Calculation (min) (ACUTE ONLY): 10 min   Charges:   PT Evaluation $PT Eval Moderate Complexity: [place], PT, DPT  Acute Rehabilitation Services  Pager: [phone] Office: [phone]   Rudean Hitt 07/30/2018, 1:57 PM

## 2018-07-31 LAB — NOVEL CORONAVIRUS, NAA (HOSP ORDER, SEND-OUT TO REF LAB; TAT 18-24 HRS): SARS-CoV-2, NAA: NOT DETECTED

## 2018-07-31 LAB — GLUCOSE, CAPILLARY
Glucose-Capillary: 100 mg/dL — ABNORMAL HIGH (ref 70–99)
Glucose-Capillary: 131 mg/dL — ABNORMAL HIGH (ref 70–99)
Glucose-Capillary: 131 mg/dL — ABNORMAL HIGH (ref 70–99)
Glucose-Capillary: 193 mg/dL — ABNORMAL HIGH (ref 70–99)

## 2018-07-31 LAB — BASIC METABOLIC PANEL
Anion gap: 11 (ref 5–15)
BUN: 78 mg/dL — ABNORMAL HIGH (ref 8–23)
CO2: 28 mmol/L (ref 22–32)
Calcium: 9.4 mg/dL (ref 8.9–10.3)
Chloride: 104 mmol/L (ref 98–111)
Creatinine, Ser: 2.28 mg/dL — ABNORMAL HIGH (ref 0.44–1.00)
GFR calc Af Amer: 23 mL/min — ABNORMAL LOW (ref >60)
GFR calc non Af Amer: 20 mL/min — ABNORMAL LOW (ref >60)
Glucose, Bld: 155 mg/dL — ABNORMAL HIGH (ref 70–99)
Potassium: 4.6 mmol/L (ref 3.5–5.1)
Sodium: 143 mmol/L (ref 135–145)

## 2018-07-31 MED ORDER — PRO-STAT SUGAR FREE PO LIQD
30.0000 mL | Freq: Two times a day (BID) | ORAL | Status: DC
  Administered 2018-07-31 – 2018-08-02 (×5): 30 mL via ORAL
  Filled 2018-07-31 (×5): qty 30

## 2018-07-31 MED ORDER — PROSIGHT PO TABS: ORAL_TABLET | Freq: Every day | ORAL | Status: DC

## 2018-07-31 MED ORDER — GLUCERNA SHAKE PO LIQD
237.0000 mL | Freq: Two times a day (BID) | ORAL | Status: DC
  Administered 2018-08-01 – 2018-08-02 (×3): 237 mL via ORAL
  Filled 2018-07-31: qty 237

## 2018-07-31 MED ORDER — PROSIGHT PO TABS
1.0000 | ORAL_TABLET | Freq: Every day | ORAL | Status: DC
  Administered 2018-07-31 – 2018-08-02 (×3): 1 via ORAL
  Filled 2018-07-31 (×3): qty 1

## 2018-07-31 NOTE — [place] FL2 (Signed)
[place] MEDICAID FL2 LEVEL OF CARE SCREENING TOOL     IDENTIFICATION  Patient Name: [name] [facility] Birthdate: May 15, 1940 Sex: female Admission Date (Current Location): 07/23/2018  [facility] and [place] Number:  Herbalist and Address:  The . [facility] [place] [address] [place], [place] [address]      Provider Number: [id number]  Attending Physician Name and Address:  Kathie Dike, MD  Relative Name and Phone Number:       Current Level of Care: Hospital Recommended Level of Care: [place] Prior Approval Number:    Date Approved/Denied:   PASRR Number: [id number]  Discharge Plan: SNF    Current Diagnoses: Patient Active Problem List   Diagnosis Date Noted  . Pressure injury of skin 07/24/2018  . Aortic atherosclerosis ([place]) 07/24/2018  . Occult blood positive stool   . Anemia due to GI blood loss 07/23/2018  . Essential hypertension 07/05/2018  . CHF (congestive heart failure), NYHA class IV, chronic, combined ([place]) 07/05/2018  . Diabetic hyperosmolar non-ketotic state ([place]) 06/17/2018  . Hyperglycemia 06/16/2018  . Type 2 diabetes mellitus ([place]) 06/16/2018  . UTI (urinary tract infection) 06/16/2018  . Sacral decubitus ulcer 06/16/2018  . Hypertensive urgency 04/04/2017  . CKD (chronic kidney disease) stage 4, GFR 15-29 ml/min (HCC) 04/04/2017  . Hypoglycemia   . Metabolic acidosis with normal anion gap and bicarbonate losses 11/23/2016  . Midfoot ulcer, left, limited to breakdown of skin ([place]) 06/13/2016  . Idiopathic chronic venous hypertension of both lower extremities with inflammation 03/10/2016  . Onychomycosis 03/10/2016  . Chronic obstructive pulmonary disease ([place]) 11/07/2013  . Upper airway cough syndrome 11/07/2013  . Insomnia 12/10/2012  . OSA (obstructive sleep apnea) 09/17/2012  . Benign neoplasm of colon 08/14/2012  . Rectal bleeding 08/13/2012    Class: Acute  . Anemia of chronic disease  08/13/2012    Class: Acute  . Osteoarthritis 08/13/2012    Class: Chronic  . Acute posthemorrhagic anemia 08/13/2012  . Diverticulosis of colon (without mention of hemorrhage) 08/13/2012  . Hoarseness 07/20/2011  . COPD with asthma ([place]) 04/20/2011  . Allergic rhinitis 04/20/2011  . GI bleed 02/08/2011  . Personal history of colonic polyps 04/16/2010  . Fecal incontinence 03/23/2010  . Change in bowel habits 03/23/2010    Orientation RESPIRATION BLADDER Height & Weight     Self, Time, Situation, Place  O2 Incontinent Weight: 79.8 kg Height:  5\' 5"  (165.1 cm)  BEHAVIORAL SYMPTOMS/MOOD NEUROLOGICAL BOWEL NUTRITION STATUS      Incontinent Diet  AMBULATORY STATUS COMMUNICATION OF NEEDS Skin   Total Care Verbally Other (Comment), PU Stage and Appropriate Care PU Stage 1 Dressing: (R heel, L heel, and L foot  unstageable wounds , dry gauze drsgs) PU Stage 2 Dressing: (sacrum stage 2 , foam drsg) PU Stage 3 Dressing: (L hip stage 3,foam drsg)                 Personal Care Assistance Level of Assistance  Bathing, Feeding Bathing Assistance: Maximum assistance Feeding assistance: Maximum assistance Dressing Assistance: Maximum assistance     Functional Limitations Info  Sight, Hearing, Speech Sight Info: Adequate Hearing Info: Adequate Speech Info: Adequate    SPECIAL CARE FACTORS FREQUENCY  PT (By licensed PT), OT (By licensed OT)     PT Frequency: 5x week OT Frequency: 5x week            Contractures Contractures Info: Not present    Additional Factors Info  Code Status, Allergies Code Status Info: Full Code Allergies Info: all generics make her sick           Current Medications (07/31/2018):  This is the current hospital active medication list Current Facility-Administered Medications  Medication Dose Route Frequency Provider Last Rate Last Dose  . acetaminophen (TYLENOL) tablet 650 mg  650 mg Oral Q6H PRN Gatha Mayer, MD   650 mg at 07/28/18 2313    Or  . acetaminophen (TYLENOL) suppository 650 mg  650 mg Rectal Q6H PRN Gatha Mayer, MD      . albuterol (PROVENTIL) (2.5 MG/3ML) 0.083% nebulizer solution 2.5 mg  2.5 mg Nebulization Q2H PRN Gatha Mayer, MD   2.5 mg at 07/28/18 0553  . amLODipine (NORVASC) tablet 10 mg  10 mg Oral Daily Gatha Mayer, MD   10 mg at 07/31/18 0836  . calcitRIOL (ROCALTROL) capsule 0.25 mcg  0.25 mcg Oral Q M,W,F Gatha Mayer, MD   0.25 mcg at 07/30/18 0933  . calcium-vitamin D (OSCAL WITH D) 500-200 MG-UNIT per tablet 1 tablet  1 tablet Oral Daily Gatha Mayer, MD   1 tablet at 07/31/18 [phone]  . folic acid (FOLVITE) tablet 0.5 mg  0.5 mg Oral Daily Gatha Mayer, MD   0.5 mg at 07/31/18 0836  . furosemide (LASIX) injection 40 mg  40 mg Intravenous BID Kathie Dike, MD   40 mg at 07/31/18 0836  . insulin aspart (novoLOG) injection 0-9 Units  0-9 Units Subcutaneous TID WC Gatha Mayer, MD   2 Units at 07/30/18 1727  . insulin glargine (LANTUS) injection 15 Units  15 Units Subcutaneous QHS Kathie Dike, MD   15 Units at 07/31/18 0022  . montelukast (SINGULAIR) tablet 10 mg  10 mg Oral QHS Gatha Mayer, MD   10 mg at 07/31/18 0023  . ondansetron (ZOFRAN) tablet 4 mg  4 mg Oral Q6H PRN Gatha Mayer, MD       Or  . ondansetron [facility]) injection 4 mg  4 mg Intravenous Q6H PRN Gatha Mayer, MD      . oxyCODONE-acetaminophen (PERCOCET/ROXICET) 5-325 MG per tablet 1 tablet  1 tablet Oral Q6H PRN Gatha Mayer, MD   1 tablet at 07/31/18 0023  . pantoprazole (PROTONIX) EC tablet 40 mg  40 mg Oral Q0600 Gatha Mayer, MD   40 mg at 07/31/18 0849  . senna-docusate (Senokot-S) tablet 1 tablet  1 tablet Oral QHS PRN Gatha Mayer, MD      . simvastatin (ZOCOR) tablet 20 mg  20 mg Oral Daily Gatha Mayer, MD   20 mg at 07/31/18 0844  . sodium chloride flush (NS) 0.9 % injection 3 mL  3 mL Intravenous Q12H Gatha Mayer, MD   3 mL at 07/31/18 0841  . vitamin B-12 (CYANOCOBALAMIN) tablet  1,000 mcg  1,000 mcg Oral Daily Gatha Mayer, MD   1,000 mcg at 07/31/18 0840     Discharge Medications: Please see discharge summary for a list of discharge medications.  Relevant Imaging Results:  Relevant Lab Results:   Additional Information SS#: [id number].  Sharin Mons, RN

## 2018-07-31 NOTE — Progress Notes (Addendum)
RNCM received consult for possible SNF placement at time of discharge. RNCM spoke with patient regarding returning to [facility] at time of discharge. Patient is agreeable given patient's current physical needs and fall risk. RNCM discussed insurance authorization process Patient expressed being hopeful for return to SNF/LTC and to feel better soon. No further questions reported at this time. RNCM to continue to follow and assist with discharge planning needs. Whitman Hero RN,BSN,CM

## 2018-07-31 NOTE — Progress Notes (Signed)
Patients heart rate below 40, Dr. Roderic Palau paged, EKG ordered. Will continue to monitor.

## 2018-07-31 NOTE — Progress Notes (Signed)
Initial Nutrition Assessment  DOCUMENTATION CODES:   Obesity unspecified  INTERVENTION:  -Glucerna Shake po BID, each supplement provides 220 kcal and 10 grams of protein  -Prostat po BID, each supplement provides 100 kcal and 15 grams of protein  -MVI  NUTRITION DIAGNOSIS:   Increased nutrient needs related to wound healing(stage II; anterior hip; mid sacrum, unstageable; bilateral heels) as evidenced by estimated needs.   GOAL:   Patient will meet greater than or equal to 90% of their needs   MONITOR:   Supplement acceptance, PO intake, Weight trends, Labs, Skin, I & O's  REASON FOR ASSESSMENT:   Low [place]    ASSESSMENT:  78 year old female with medical history significant of COPD on 2/3L nightly, CKD4, systolic/diastolic CHF, history of peptic ulcer disease, IDDM, HTN, HLD, OSA not on CPAP, admitted with anemia second to GI blood loss.  6/25-6/30 - admission for acute on chronic stage IV kidney injury secondary to overdiuresis.   7/15: EGD - acute on chronic anemia; FOBT+, no melena or bleeding reported. Not iron deficient  Very delightful patient finishing lunch at time of visit. Patient ordered chicken sandwich and apples. Pt endorses good po during admission; stating all meals were good except for breakfast. She states that the eggs are too hard and always cold. RD offered to send hard boiled eggs; pt declined and states that she enjoys the oatmeal an bacon.  Pt eating 69% of last 8 recorded meals with the least po documented at breakfast.   RD offered Ensure in between meals to supplement po intake; pt reports liking chocolate Glucerna and requested to have instead.   Pt denies recent changes in wt; reporting UBW of  175-180 lbs. Pt noted with +2 BLE edema  Medications reviewed and include: calcitriol, oscal with D, folic acid, lasix, SSI, Lantus 15 units at bedtime, Protonix, B12, oxycodone  Labs: reviewed   NUTRITION - FOCUSED PHYSICAL EXAM:    Most  Recent Value  Orbital Region  No depletion  Upper Arm Region  Mild depletion  Thoracic and Lumbar Region  No depletion  Buccal Region  No depletion  Temple Region  Mild depletion  Clavicle Bone Region  No depletion  Clavicle and Acromion Bone Region  No depletion  Scapular Bone Region  Unable to assess  Dorsal Hand  Mild depletion  Patellar Region  Unable to assess  Anterior Thigh Region  Unable to assess  Posterior Calf Region  Unable to assess  Edema (RD Assessment)  Moderate  Hair  Reviewed  Eyes  Reviewed  Mouth  Reviewed  Skin  Reviewed  Nails  Reviewed       Diet Order:   Diet Order            Diet Carb Modified Fluid consistency: Thin; Room service appropriate? Yes with Assist  Diet effective now              EDUCATION NEEDS:   No education needs have been identified at this time  Skin:  Skin Assessment: Skin Integrity Issues: Skin Integrity Issues:: Other (Comment), Unstageable, Stage II Stage II: lt anterior hip, mid sacrum Unstageable: bilateral heels, lt foot Other: MASD perineum and buttocks  Last BM:  07/29/18  Height:   Ht Readings from Last 1 Encounters:  07/25/18 5\' 5"  (1.651 m)    Weight:   Wt Readings from Last 1 Encounters:  07/31/18 79.8 kg    Ideal Body Weight:  56.8 kg  BMI:  Body mass index  is 29.29 kg/m.  Estimated Nutritional Needs:   Kcal:  2000-2200  Protein:  110-119 g  Fluid:  2L    Lajuan Lines, RD, LDN  After Hours/Weekend Pager: [phone]

## 2018-07-31 NOTE — Progress Notes (Signed)
PROGRESS NOTE  [name] VEL:[id number] DOB: [birth year] DOA: 07/23/2018 PCP: Burnard Bunting, MD  Brief History   78 year old woman PMH COPD on oxygen, CKD stage IV, chronic combined systolic, diastolic CHF, peptic ulcer disease, diabetes mellitus type 2 presented for evaluation of low hemoglobin.  Recently admitted for acute on chronic kidney disease stage IV secondary to overdiuresis.  Routine labs at [facility] revealed a hemoglobin of 6.5.  Fecal occult blood was positive.  Patient was started on Protonix infusion and admitted for further evaluation.  Admitted for further evaluation of anemia.  A & P  Acute on chronic anemia, multifactorial, not iron deficient, upper GI bleed source suspected.  Fecal occult blood test positive.  Remote history of reflux esophagitis, not currently on PPI.  PMH hyperplastic and adenomatous colon polyps. --Appears clinically stable. -- Status post 1 unit PRBC --Trend hemoglobin. --Patient underwent EGD that did not show any significant signs of bleeding. --Continue Protonix  Diabetes mellitus type 2 --CBG stable.  Continue Lantus, sliding scale insulin  AKI on CKD stage IV --Initial Improvement of creatinine with IV hydration.  Unfortunately, she did develop decompensated CHF. She is now being diuresed for volume overload, and creatinine is improving. Continue to follow in the setting of diuresis.   Acute on chronic combined systolic and diastolic CHF --Likely precipitated by IV fluids which she had been receiving for renal failure.  IV fluids have since been discontinued and she has been receiving intravenous Lasix.  Urine output responding appropriately and overall respiratory status is improving, although not back to baseline yet. Intake and output not accurate per patient, since she frequently has urine in the bed. Overall edema is improving. We will continue with intravenous Lasix.  Monitor intake and output.  Acute on chronic  respiratory failure with hypoxia -Secondary to decompensated CHF.  Briefly required BiPAP, but is now back on nasal cannula.  She normally only wears oxygen during the night and not during the day. We will try and wean off oxygen as tolerated   COPD with chronic hypoxic respiratory failure on 3 L nasal cannula --Appears stable.  Bilateral heel pressure injuries, sacral decubitus --Continue management as per wound care RN  Aortic atherosclerosis --no inpatient evaluation planned.  DVT prophylaxis: SCDs Code Status: Full Family Communication: no answer when attempted to reach emergency contacts listed in chart.  Disposition Plan: return to [facility] once adequately diuresed, repeat COVID test ordered.   Kathie Dike, MD  [facility] Direct contact: see [email or web address] (further directions at bottom of note if needed) 7PM-7AM contact night coverage as at bottom of note 07/31/2018, 10:20 AM  LOS: 6 days   Consultants  . Gastroenterology  Procedures     Antibiotics  .   Interval History/Subjective  Had an episode of bradycardia this morning, patient was asymptomatic/sleeping. Feels that breathing is slowly improving, but does not feel that is it back to baseline yet.  Objective   Vitals:  Vitals:   07/31/18 0515 07/31/18 0845  BP: (!) 154/67 (!) 156/57  Pulse: 61 68  Resp: 16   Temp: 98.3 F (36.8 C) 97.6 F (36.4 C)  SpO2: 94% 94%    Exam: General exam: Alert, awake, oriented x 3 Respiratory system: crackles at bases. Respiratory effort normal. Cardiovascular system:RRR. No murmurs, rubs, gallops. Gastrointestinal system: Abdomen is nondistended, soft and nontender. No organomegaly or masses felt. Normal bowel sounds heard. Central nervous system: Alert and oriented. No focal neurological deficits. Extremities: 11+ edema in LE  bilaterally Skin: No rashes, lesions or ulcers Psychiatry: Judgement and insight appear normal. Mood & affect appropriate.     I have personally reviewed the following:   Today's Data    Lab Data  . Urinalysis negative . SARS-CoV-2 negative  Micro Data  .   Imaging  . Chest x-ray shows CHF  Cardiology Data  . EKG independently reviewed sinus rhythm, nonspecific ST changes.  No acute changes seen.  Other Data  .   Scheduled Meds: . amLODipine  10 mg Oral Daily  . calcitRIOL  0.25 mcg Oral Q M,W,F  . calcium-vitamin D  1 tablet Oral Daily  . folic acid  0.5 mg Oral Daily  . furosemide  40 mg Intravenous BID  . insulin aspart  0-9 Units Subcutaneous TID WC  . insulin glargine  15 Units Subcutaneous QHS  . montelukast  10 mg Oral QHS  . pantoprazole  40 mg Oral Q0600  . simvastatin  20 mg Oral Daily  . sodium chloride flush  3 mL Intravenous Q12H  . vitamin B-12  1,000 mcg Oral Daily   Continuous Infusions:   Principal Problem:   Anemia due to GI blood loss Active Problems:   Anemia of chronic disease   Chronic obstructive pulmonary disease (HCC)   CKD (chronic kidney disease) stage 4, GFR 15-29 ml/min (HCC)   Type 2 diabetes mellitus (HCC)   Essential hypertension   CHF (congestive heart failure), NYHA class IV, chronic, combined ([place])   Pressure injury of skin   Occult blood positive stool   Aortic atherosclerosis ([place])   LOS: 6 days   How to contact the [facility] Attending or Consulting provider 7A - 7P or covering provider during after hours 7P -7A, for this patient?  1. Check the care team in [facility] and look for a) attending/consulting TR[facility] provider listed and b) the [facility] team listed 2. Log into [email or web address] and use [place]'s universal password to access. If you do not have the password, please contact the hospital operator. 3. Locate the [facility] provider you are looking for under [facility] and page to a number that you can be directly reached. 4. If you still have difficulty reaching the provider, please page the [facility] (Director on Call) for the Hospitalists listed on amion for  assistance.

## 2018-08-01 ENCOUNTER — Inpatient Hospital Stay (HOSPITAL_COMMUNITY): Payer: Medicare Other

## 2018-08-01 DIAGNOSIS — D638 Anemia in other chronic diseases classified elsewhere: Secondary | ICD-10-CM

## 2018-08-01 LAB — BASIC METABOLIC PANEL
Anion gap: 9 (ref 5–15)
BUN: 87 mg/dL — ABNORMAL HIGH (ref 8–23)
CO2: 31 mmol/L (ref 22–32)
Calcium: 8.9 mg/dL (ref 8.9–10.3)
Chloride: 102 mmol/L (ref 98–111)
Creatinine, Ser: 2.59 mg/dL — ABNORMAL HIGH (ref 0.44–1.00)
GFR calc Af Amer: 20 mL/min — ABNORMAL LOW (ref >60)
GFR calc non Af Amer: 17 mL/min — ABNORMAL LOW (ref >60)
Glucose, Bld: 158 mg/dL — ABNORMAL HIGH (ref 70–99)
Potassium: 4.6 mmol/L (ref 3.5–5.1)
Sodium: 142 mmol/L (ref 135–145)

## 2018-08-01 LAB — GLUCOSE, CAPILLARY
Glucose-Capillary: 83 mg/dL (ref 70–99)
Glucose-Capillary: 183 mg/dL — ABNORMAL HIGH (ref 70–99)
Glucose-Capillary: 157 mg/dL — ABNORMAL HIGH (ref 70–99)
Glucose-Capillary: 137 mg/dL — ABNORMAL HIGH (ref 70–99)

## 2018-08-01 LAB — CBC
HCT: 26.2 % — ABNORMAL LOW (ref 36.0–46.0)
Hemoglobin: 8.3 g/dL — ABNORMAL LOW (ref 12.0–15.0)
MCH: 28.7 pg (ref 26.0–34.0)
MCHC: 31.7 g/dL (ref 30.0–36.0)
MCV: 90.7 fL (ref 80.0–100.0)
Platelets: 299 10*3/uL (ref 150–400)
RBC: 2.89 MIL/uL — ABNORMAL LOW (ref 3.87–5.11)
RDW: 15.9 % — ABNORMAL HIGH (ref 11.5–15.5)
WBC: 10.3 10*3/uL (ref 4.0–10.5)
nRBC: 0 % (ref 0.0–0.2)

## 2018-08-01 NOTE — Progress Notes (Addendum)
Patient has refused BIPAP again tonight.  No distress noted.  Will continue to monitor.

## 2018-08-01 NOTE — Progress Notes (Signed)
PROGRESS NOTE    [name]  LYY:[id number] DOB: 09/13/1940 DOA: 07/23/2018 PCP: Burnard Bunting, MD   Brief Narrative:  HPI On 07/23/2018 by Dr. Zada Finders [name] is a 78 y.o. female with medical history significant for COPD on 2-3 L nightly and as needed, CKD stage IV, chronic combined systolic and diastolic CHF, history of peptic ulcer disease, insulin-dependent type 2 diabetes, hypertension, hyperlipidemia, and OSA not on CPAP (uses O2 via [place] at night) who is brought to the ED from [facility] for evaluation of anemia.  Patient was recently admitted from 07/05/2018-07/10/2018 for acute on chronic stage IV kidney injury secondary to overdiuresis.  She was previously on torsemide 50 mg twice daily.  Diuretics were held and she was hydrated with improvement in renal function with creatinine 2.4 on discharge.  Her torsemide was reduced to 20 mg twice daily on discharge.  She was discharged to [facility] and on routine labs she was noted to have a drop in hemoglobin to 6.5.  She reports intermittent dark appearing stools without other obvious bleeding.  She otherwise denies any chest pain, dyspnea, lightheadedness, dizziness, dysuria, abdominal pain, or change in her chronic lower extremity edema. Assessment & Plan   Acute on chronic anemia -multifactorial, not iron deficient, upper GI bleed source suspected.   -FOBT + -History of reflux esophagitis currently not on PPI.  Also with past medical history of hyperplastic and adenomatous colon polyps -Gastroenterology consulted and appreciated, status post EGD which did not show significant signs of bleeding -Patient did receive 1 unit PRBC -Currently on Protonix -Hemoglobin currently stable, 8.3 -Continue to monitor H&H  Diabetes mellitus, type 2 -Continue Lantus, insulin sliding scale and CBG monitoring  AKI on CKD stage IV -Creatinine did peak to 3.37 -Initial improvement of creatinine with  IV fluids unfortunately she did develop decompensated CHF -Patient is being diuresed for volume overload -Creatinine currently 2.59, continue to monitor  Acute on chronic diastolic CHF -Likely secondary to IV fluids which she was receiving for acute kidney injury -Last echocardiogram 06/19/2018 shows an EF of greater than 65%.  LV diastolic Doppler parameters consistent with pseudonormalization. -Fluids have been discontinued and patient currently on IV Lasix 40 mg IV twice daily -She does appear to be somewhat tachypneic this morning and complaining of shortness of breath. -Patient does use 2 L of home oxygen, currently requiring more than her baseline. -Pending repeat chest x-ray today -Monitor intake and output, daily weights (urine output over the past 24-hour 1650 cc) -Edema does not appear to be improving  Acute on chronic respiratory failure with hypoxia -Patient usually on 2 L of home oxygen, currently requiring up to 3 L.  States that she normally wears oxygen during the night and not during the day. -Suspect secondary to decompensated CHF. -Patient did require BiPAP briefly but now back on nasal cannula -Will attempt to wean as possible -As above, continue IV Lasix and obtain repeat chest x-ray  COPD with chronic hypoxic respiratory failure  -No active wheezing.  Appears stable. -Continue nasal cannula as above  Bilateral heel pressure injuries, sacral decubitus -Continue management as per wound care RN -POA  Aortic atherosclerosis -no inpatient evaluation planned  DVT Prophylaxis SCDs  Code Status: Full  Family Communication: None at bedside  Disposition Plan: Admitted.  Pending SNF when respiratory status is improved- hopefully on 7/23. COVID test negative on 7/20  Consultants Gastroenterology  Procedures  EGD  Antibiotics   Anti-infectives (From  admission, onward)   None      Subjective:   [name] seen and examined today.  Patient has no  complaints this morning.  Would like to get out of bed.  Feels her breathing has improved but not back to her normal.  States she only wears oxygen at night.  Denies current chest pain, abdominal pain, nausea or vomiting, diarrhea or constipation, dizziness or headache.  Objective:   Vitals:   07/31/18 1652 07/31/18 1859 07/31/18 2332 08/01/18 0822  BP:  (!) 146/56 (!) 166/71 (!) 155/64  Pulse: 79 63 64 79  Resp: 18  (!) 26 (!) 22  Temp:   98.6 F (37 C) 98.6 F (37 C)  TempSrc:   Oral Oral  SpO2: 95% 93% 95% 91%  Weight:      Height:        Intake/Output Summary (Last 24 hours) at 08/01/2018 1053 Last data filed at 08/01/2018 0836 Gross per 24 hour  Intake 480 ml  Output 1650 ml  Net -1170 ml   Filed Weights   07/25/18 0809 07/30/18 0528 07/31/18 0527  Weight: 75 kg 81.6 kg 79.8 kg    Exam  General: Well developed, chronically ill-appearing, elderly, NAD  HEENT: NCAT, mucous membranes moist.   Neck: Supple  Cardiovascular: S1 S2 auscultated, RRR, 2/6 SEM  Respiratory: Diminished breath sounds, mild tachypnea on exam  Abdomen: Soft, nontender, nondistended, + bowel sounds  Extremities: warm dry without cyanosis clubbing. LE edema B/L   Neuro: AAOx3, nonfocal  Psych:, Appropriate mood and affect   Data Reviewed: I have personally reviewed following labs and imaging studies  CBC: Recent Labs  Lab 07/26/18 0320 07/27/18 0256 07/29/18 0408 07/30/18 0215 08/01/18 0304  WBC 10.0 10.0 9.4 10.5 10.3  HGB 7.5* 8.1* 7.7* 8.2* 8.3*  HCT 23.7* 25.7* 24.6* 25.5* 26.2*  MCV 92.2 90.8 91.8 90.1 90.7  PLT 242 247 268 282 474   Basic Metabolic Panel: Recent Labs  Lab 07/28/18 0249 07/29/18 0408 07/30/18 0215 07/31/18 0300 08/01/18 0304  NA 140 141 143 143 142  K 5.0 4.6 4.3 4.6 4.6  CL 108 107 105 104 102  CO2 23 24 28 28 31   GLUCOSE 84 169* 201* 155* 158*  BUN 85* 85* 83* 78* 87*  CREATININE 2.46* 2.52* 2.36* 2.28* 2.59*  CALCIUM 8.8* 8.8* 9.0 9.4 8.9    GFR: Estimated Creatinine Clearance: 19.3 mL/min (A) (by C-G formula based on SCr of 2.59 mg/dL (H)). Liver Function Tests: No results for input(s): AST, ALT, ALKPHOS, BILITOT, PROT, ALBUMIN in the last 168 hours. No results for input(s): LIPASE, AMYLASE in the last 168 hours. No results for input(s): AMMONIA in the last 168 hours. Coagulation Profile: No results for input(s): INR, PROTIME in the last 168 hours. Cardiac Enzymes: No results for input(s): CKTOTAL, CKMB, CKMBINDEX, TROPONINI in the last 168 hours. BNP (last 3 results) No results for input(s): PROBNP in the last 8760 hours. HbA1C: No results for input(s): HGBA1C in the last 72 hours. CBG: Recent Labs  Lab 07/31/18 0751 07/31/18 1148 07/31/18 1631 07/31/18 2335 08/01/18 0749  GLUCAP 100* 131* 193* 131* 83   Lipid Profile: No results for input(s): CHOL, HDL, LDLCALC, TRIG, CHOLHDL, LDLDIRECT in the last 72 hours. Thyroid Function Tests: No results for input(s): TSH, T4TOTAL, FREET4, T3FREE, THYROIDAB in the last 72 hours. Anemia Panel: No results for input(s): VITAMINB12, FOLATE, FERRITIN, TIBC, IRON, RETICCTPCT in the last 72 hours. Urine analysis:    Component Value Date/Time  COLORURINE STRAW (A) 07/23/2018 1656   APPEARANCEUR CLEAR 07/23/2018 1656   LABSPEC 1.011 07/23/2018 1656   PHURINE 5.0 07/23/2018 1656   GLUCOSEU NEGATIVE 07/23/2018 1656   HGBUR NEGATIVE 07/23/2018 1656   BILIRUBINUR NEGATIVE 07/23/2018 1656   KETONESUR NEGATIVE 07/23/2018 1656   PROTEINUR NEGATIVE 07/23/2018 1656   UROBILINOGEN 0.2 12/16/2011 1717   NITRITE NEGATIVE 07/23/2018 1656   LEUKOCYTESUR NEGATIVE 07/23/2018 1656   Sepsis Labs: @LABRCNTIP (procalcitonin:4,lacticidven:4)  ) Recent Results (from the past 240 hour(s))  SARS Coronavirus 2 (CEPHEID - Performed in [facility] hospital lab), Hosp Order     Status: None   Collection Time: 07/23/18  4:50 PM   Specimen: Urine, Clean Catch; Nasopharyngeal  Result Value Ref  Range Status   SARS Coronavirus 2 NEGATIVE NEGATIVE Final    Comment: (NOTE) If result is NEGATIVE SARS-CoV-2 target nucleic acids are NOT DETECTED. The SARS-CoV-2 RNA is generally detectable in upper and lower  respiratory specimens during the acute phase of infection. The lowest  concentration of SARS-CoV-2 viral copies this assay can detect is 250  copies / mL. A negative result does not preclude SARS-CoV-2 infection  and should not be used as the sole basis for treatment or other  patient management decisions.  A negative result may occur with  improper specimen collection / handling, submission of specimen other  than nasopharyngeal swab, presence of viral mutation(s) within the  areas targeted by this assay, and inadequate number of viral copies  (<250 copies / mL). A negative result must be combined with clinical  observations, patient history, and epidemiological information. If result is POSITIVE SARS-CoV-2 target nucleic acids are DETECTED. The SARS-CoV-2 RNA is generally detectable in upper and lower  respiratory specimens dur ing the acute phase of infection.  Positive  results are indicative of active infection with SARS-CoV-2.  Clinical  correlation with patient history and other diagnostic information is  necessary to determine patient infection status.  Positive results do  not rule out bacterial infection or co-infection with other viruses. If result is PRESUMPTIVE POSTIVE SARS-CoV-2 nucleic acids MAY BE PRESENT.   A presumptive positive result was obtained on the submitted specimen  and confirmed on repeat testing.  While 2019 novel coronavirus  (SARS-CoV-2) nucleic acids may be present in the submitted sample  additional confirmatory testing may be necessary for epidemiological  and / or clinical management purposes  to differentiate between  SARS-CoV-2 and other Sarbecovirus currently known to infect humans.  If clinically indicated additional testing with an  alternate test  methodology [phone]) is advised. The SARS-CoV-2 RNA is generally  detectable in upper and lower respiratory sp ecimens during the acute  phase of infection. The expected result is Negative. Fact Sheet for Patients:  StrictlyIdeas.no Fact Sheet for Healthcare Providers: BankingDealers.co.za This test is not yet approved or cleared by the [place] FDA and has been authorized for detection and/or diagnosis of SARS-CoV-2 by FDA under an Emergency Use Authorization (EUA).  This EUA will remain in effect (meaning this test can be used) for the duration of the COVID-19 declaration under Section 564(b)(1) of the Act, 21 U.S.C. section 360bbb-3(b)(1), unless the authorization is terminated or revoked sooner. Performed at [facility], [place] [address] [place], [place] [address]   Novel Coronavirus, NAA (hospital order; send-out to ref lab)     Status: None   Collection Time: 07/30/18  3:55 PM   Specimen: Nasopharyngeal Swab; Respiratory  Result Value Ref Range Status   SARS-CoV-2, NAA  NOT DETECTED NOT DETECTED Final    Comment: (NOTE) This test was developed and its performance characteristics determined by [employer]. This test has not been FDA cleared or approved. This test has been authorized by FDA under an Emergency Use Authorization (EUA). This test is only authorized for the duration of time the declaration that circumstances exist justifying the authorization of the emergency use of in vitro diagnostic tests for detection of SARS-CoV-2 virus and/or diagnosis of COVID-19 infection under section 564(b)(1) of the Act, 21 U.S.C. 937DSK-8(J)(6), unless the authorization is terminated or revoked sooner. When diagnostic testing is negative, the possibility of a false negative result should be considered in the context of a patient's recent exposures and the presence of clinical signs and symptoms  consistent with COVID-19. An individual without symptoms of COVID-19 and who is not shedding SARS-CoV-2 virus would expect to have a negative (not detected) result in this assay. Performed  At: [facility] [address] [place], [place] [id number] Rush Farmer MD BT:[id number]    [place]  Final    Comment: Performed at [facility], [place] [address], [place], [place] [address]      Radiology Studies: No results found.   Scheduled Meds: . amLODipine  10 mg Oral Daily  . calcitRIOL  0.25 mcg Oral Q M,W,F  . calcium-vitamin D  1 tablet Oral Daily  . feeding supplement (GLUCERNA SHAKE)  237 mL Oral BID BM  . feeding supplement (PRO-STAT SUGAR FREE 64)  30 mL Oral BID  . folic acid  0.5 mg Oral Daily  . furosemide  40 mg Intravenous BID  . insulin aspart  0-9 Units Subcutaneous TID WC  . insulin glargine  15 Units Subcutaneous QHS  . montelukast  10 mg Oral QHS  . multivitamin  1 tablet Oral Daily  . pantoprazole  40 mg Oral Q0600  . simvastatin  20 mg Oral Daily  . sodium chloride flush  3 mL Intravenous Q12H  . vitamin B-12  1,000 mcg Oral Daily   Continuous Infusions:   LOS: 7 days   Time Spent in minutes   30 minutes  Cayde Held D.O. on 08/01/2018 at 10:53 AM  Between 7am to 7pm - Please see pager noted on [email or web address]  After 7pm go to [email or web address]  And look for the night coverage person covering for me after hours  [facility] Office  [phone]

## 2018-08-01 NOTE — Progress Notes (Signed)
Pt not wearing CPAP. 

## 2018-08-02 LAB — BASIC METABOLIC PANEL
Anion gap: 9 (ref 5–15)
BUN: 95 mg/dL — ABNORMAL HIGH (ref 8–23)
CO2: 30 mmol/L (ref 22–32)
Calcium: 8.8 mg/dL — ABNORMAL LOW (ref 8.9–10.3)
Chloride: 103 mmol/L (ref 98–111)
Creatinine, Ser: 2.55 mg/dL — ABNORMAL HIGH (ref 0.44–1.00)
GFR calc Af Amer: 20 mL/min — ABNORMAL LOW (ref >60)
GFR calc non Af Amer: 18 mL/min — ABNORMAL LOW (ref >60)
Glucose, Bld: 101 mg/dL — ABNORMAL HIGH (ref 70–99)
Potassium: 4.7 mmol/L (ref 3.5–5.1)
Sodium: 142 mmol/L (ref 135–145)

## 2018-08-02 LAB — HEMOGLOBIN AND HEMATOCRIT, BLOOD
HCT: 27.6 % — ABNORMAL LOW (ref 36.0–46.0)
Hemoglobin: 8.6 g/dL — ABNORMAL LOW (ref 12.0–15.0)

## 2018-08-02 LAB — GLUCOSE, CAPILLARY
Glucose-Capillary: 96 mg/dL (ref 70–99)
Glucose-Capillary: 189 mg/dL — ABNORMAL HIGH (ref 70–99)

## 2018-08-02 MED ORDER — OXYCODONE-ACETAMINOPHEN 5-325 MG PO TABS: 1.0000 | ORAL_TABLET | Freq: Four times a day (QID) | ORAL | 0 refills | Status: DC | PRN

## 2018-08-02 MED ORDER — PANTOPRAZOLE SODIUM 40 MG PO TBEC: 40.0000 mg | DELAYED_RELEASE_TABLET | Freq: Every day | ORAL | Status: DC

## 2018-08-02 MED ORDER — GLUCERNA SHAKE PO LIQD: 237.0000 mL | Freq: Two times a day (BID) | ORAL | 0 refills | Status: DC

## 2018-08-02 MED ORDER — INSULIN GLARGINE 100 UNIT/ML ~~LOC~~ SOLN: 15.0000 [IU] | Freq: Every day | SUBCUTANEOUS | Status: DC

## 2018-08-02 MED ORDER — PROSIGHT PO TABS: 1.0000 | ORAL_TABLET | Freq: Every day | ORAL | 0 refills | Status: DC

## 2018-08-02 NOTE — Progress Notes (Signed)
Attempt to give report to receiving nurse at [facility] x2

## 2018-08-02 NOTE — Discharge Instructions (Signed)
Anemia  Anemia is a condition in which you do not have enough red blood cells or hemoglobin. Hemoglobin is a substance in red blood cells that carries oxygen. When you do not have enough red blood cells or hemoglobin (are anemic), your body cannot get enough oxygen and your organs may not work properly. As a result, you may feel very tired or have other problems. What are the causes? Common causes of anemia include:  Excessive bleeding. Anemia can be caused by excessive bleeding inside or outside the body, including bleeding from the intestine or from periods in women.  Poor nutrition.  Long-lasting (chronic) kidney, thyroid, and liver disease.  Bone marrow disorders.  Cancer and treatments for cancer.  HIV (human immunodeficiency virus) and AIDS (acquired immunodeficiency syndrome).  Treatments for HIV and AIDS.  Spleen problems.  Blood disorders.  Infections, medicines, and autoimmune disorders that destroy red blood cells. What are the signs or symptoms? Symptoms of this condition include:  Minor weakness.  Dizziness.  Headache.  Feeling heartbeats that are irregular or faster than normal (palpitations).  Shortness of breath, especially with exercise.  Paleness.  Cold sensitivity.  Indigestion.  Nausea.  Difficulty sleeping.  Difficulty concentrating. Symptoms may occur suddenly or develop slowly. If your anemia is mild, you may not have symptoms. How is this diagnosed? This condition is diagnosed based on:  Blood tests.  Your medical history.  A physical exam.  Bone marrow biopsy. Your health care provider may also check your stool (feces) for blood and may do additional testing to look for the cause of your bleeding. You may also have other tests, including:  Imaging tests, such as a CT scan or MRI.  Endoscopy.  Colonoscopy. How is this treated? Treatment for this condition depends on the cause. If you continue to lose a lot of blood, you may  need to be treated at a hospital. Treatment may include:  Taking supplements of iron, vitamin M08, or folic acid.  Taking a hormone medicine (erythropoietin) that can help to stimulate red blood cell growth.  Having a blood transfusion. This may be needed if you lose a lot of blood.  Making changes to your diet.  Having surgery to remove your spleen. Follow these instructions at home:  Take over-the-counter and prescription medicines only as told by your health care provider.  Take supplements only as told by your health care provider.  Follow any diet instructions that you were given.  Keep all follow-up visits as told by your health care provider. This is important. Contact a health care provider if:  You develop new bleeding anywhere in the body. Get help right away if:  You are very weak.  You are short of breath.  You have pain in your abdomen or chest.  You are dizzy or feel faint.  You have trouble concentrating.  You have bloody or black, tarry stools.  You vomit repeatedly or you vomit up blood. Summary  Anemia is a condition in which you do not have enough red blood cells or enough of a substance in your red blood cells that carries oxygen (hemoglobin).  Symptoms may occur suddenly or develop slowly.  If your anemia is mild, you may not have symptoms.  This condition is diagnosed with blood tests as well as a medical history and physical exam. Other tests may be needed.  Treatment for this condition depends on the cause of the anemia. This information is not intended to replace advice given to you by  your health care provider. Make sure you discuss any questions you have with your health care provider. °Document Released: 02/04/2004 Document Revised: 12/09/2016 Document Reviewed: 01/29/2016 °Elsevier Patient Education © 2020 [employer] Inc. ° °

## 2018-08-02 NOTE — Progress Notes (Signed)
Attempted report to receiving nurse x3.

## 2018-08-02 NOTE — Progress Notes (Signed)
Received phone call from [place] stating that pt's BP was 224/84. Requested to recheck pressure as pt's baseline pressure was between 967-591'M systolic. PTAR stated they rechecked pressure getting the same BP reading. PTAR requested to bring pt back to floor. Brought pt back to the floor. This nurse reassessed pt's BP automatically. BP was 158/62. PTAR stated the were calculating pressure manually and requested a manuel reading. This nurse obtained manual BP and BP was 148/62. PTAR completed another manual BP check and was consistent with this nurses BP reading. PTAR transported pt via stretcher off the floor. MD aware of situation.

## 2018-08-02 NOTE — Progress Notes (Signed)
Pt given discharge instructions, prescriptions, and care notes. Pt verbalized understanding AEB no further questions or concerns at this time. IV was discontinued, no redness, pain, or swelling noted at this time. Telemetry discontinued and Centralized Telemetry was notified. Pt left the floor via PTAR in stable condition.  °

## 2018-08-02 NOTE — Progress Notes (Signed)
Patient will DC to: [facility] Anticipated DC date: 08/02/2018 Family notified: Patient made aware, LTC pt @ [facility]. Transport by: Corey Harold   Per MD patient ready for DC today . RN, patient and facility notified of DC. Discharge Summary and FL2 sent to facility. RN to call report prior to discharge [phone]). Pt's room # 110. DC packet on chart. Ambulance transport requested for patient.   RNCM will sign off for now as intervention is no longer needed. Please consult [place] again if new needs arise.

## 2018-08-02 NOTE — Discharge Summary (Signed)
Physician Discharge Summary  [name] [facility] LZJ:[id number] DOB: [birth year] DOA: 07/23/2018  PCP: Burnard Bunting, MD  Admit date: 07/23/2018 Discharge date: 08/02/2018  Time spent: 45 minutes  Recommendations for Outpatient Follow-up:  Patient will be discharged to skilled nursing facility.  Patient will need to follow up with primary care provider within one week of discharge, repeat CBC and BMP.  Patient should continue medications as prescribed.  Patient should follow a heart healthy/carb modified  diet.   Discharge Diagnoses:  Acute on chronic anemia Diabetes mellitus, type 2 AKI on CKD stage IV Acute on chronic diastolic CHF Acute on chronic respiratory failure with hypoxia COPD with chronic hypoxic respiratory failure  Bilateral heel pressure injuries, sacral decubitus Aortic atherosclerosis  Discharge Condition: Stable  Diet recommendation: heart healthy/carb modified  Filed Weights   07/30/18 0528 07/31/18 0527 08/02/18 0535  Weight: 81.6 kg 79.8 kg 81.6 kg    History of present illness:  On 07/23/2018 by Dr. Alvira Monday McDowis a 78 y.o.femalewith medical history significant forCOPD on 2-3 L nightly and as needed, CKD stage IV, chronic combined systolic and diastolic CHF, history of peptic ulcer disease, insulin-dependent type 2 diabetes, hypertension, hyperlipidemia, andOSA not on CPAP (uses O2 via [place] at night) who is brought to the ED from [facility] [facility] for evaluation of anemia.  Patient was recently admitted from 07/05/2018-07/10/2018 for acute on chronic stage IV kidney injury secondary to overdiuresis. She was previously on torsemide 50 mg twice daily. Diuretics were held and she was hydrated with improvement in renal function with creatinine 2.4 on discharge. Her torsemide was reduced to 20 mg twice daily on discharge.  She was discharged to [facility] [facility] andonroutine labs she was noted to have a drop in hemoglobin to  6.5. She reports intermittent dark appearing stools without other obvious bleeding. She otherwise denies any chest pain, dyspnea, lightheadedness, dizziness, dysuria, abdominal pain, or change in her chronic lower extremity edema.  Hospital Course:  Acute on chronic anemia -multifactorial, not iron deficient, upper GI bleed source suspected.  -FOBT + -History of reflux esophagitis currently not on PPI.  Also with past medical history of hyperplastic and adenomatous colon polyps -Gastroenterology consulted and appreciated, status post EGD which did not show significant signs of bleeding -Patient did receive 1 unit PRBC -Currently on Protonix -Hemoglobin currently stable, 8.6 -repeat CBC  Diabetes mellitus, type 2 -Continue Lantus, insulin sliding scale and CBG monitoring -dose of lantus decreased- may need gradual increase back to 30u dose   AKI on CKD stage IV -Creatinine did peak to 3.37 -Initial improvement of creatinine with IV fluids unfortunately she did develop decompensated CHF -Patient was diuresed for volume overload -Creatinine currently 2.55  Acute on chronic diastolic CHF -Likely secondary to IV fluids which she was receiving for acute kidney injury -Last echocardiogram 06/19/2018 shows an EF of greater than 65%.  LV diastolic Doppler parameters consistent with pseudonormalization. -Fluids have been discontinued and patient was placed on IV Lasix 40 mg IV twice daily -repeat CXR showed vascular congestion -Monitor intake and output, daily weights (urine output over the past 24-hour 1800 cc) -Edema does appear to be improving -Resume torsemide at discharge   Acute on chronic respiratory failure with hypoxia -Improving, back on 2L (home amount) -Suspect secondary to decompensated CHF. -Patient did require BiPAP briefly but now back on nasal cannula  COPD with chronic hypoxic respiratory failure  -No active wheezing.  Appears stable. -Continue nasal cannula as  above  Bilateral heel pressure injuries, sacral decubitus -Continue management as per wound care RN -POA  Aortic atherosclerosis -no inpatient evaluation planned  Consultants Gastroenterology  Procedures  EGD  Discharge Exam: Vitals:   08/02/18 1046 08/02/18 1247  BP:  (!) 160/90  Pulse:  76  Resp:  16  Temp: 98.5 F (36.9 C) 98.6 F (37 C)  SpO2:  95%     General: Well developed, well nourished, NAD, appears stated age  HEENT: NCAT, mucous membranes moist.  Cardiovascular: S1 S2 auscultated, SEM, RRR  Respiratory: Clear to auscultation bilaterally   Abdomen: Soft, nontender, nondistended, + bowel sounds  Extremities: warm dry without cyanosis clubbing. LE edema improving  Neuro: AAOx3, nonfocal  Psych: Pleasent, appropriate mood and affect  Discharge Instructions Discharge Instructions    Discharge instructions   Complete by: As directed    Patient will be discharged to skilled nursing facility.  Patient will need to follow up with primary care provider within one week of discharge, repeat CBC and BMP.  Patient should continue medications as prescribed.  Patient should follow a heart healthy/carb modified  diet.     Allergies as of 08/02/2018      Reactions   Other Other (See Comments)   "all generics make her sick"      Medication List    STOP taking these medications   ipratropium-albuterol 0.5-2.5 (3) MG/3ML Soln Commonly known as: DuoNeb     TAKE these medications   acetaminophen 325 MG tablet Commonly known as: TYLENOL Take 650 mg by mouth every 6 (six) hours as needed for mild pain.   amLODipine 10 MG tablet Commonly known as: NORVASC Take 10 mg by mouth daily.   calcitRIOL 0.25 MCG capsule Commonly known as: ROCALTROL Take 1 capsule (0.25 mcg total) by mouth every Monday, Wednesday, and Friday.   Caltrate 600+D 600-400 MG-UNIT tablet Generic drug: Calcium Carbonate-Vitamin D Take 1 tablet by mouth daily.   collagenase ointment  Commonly known as: SANTYL Apply 1 application topically daily.   cyclobenzaprine 10 MG tablet Commonly known as: FLEXERIL Take 10 mg 3 (three) times daily as needed by mouth for muscle spasms.   feeding supplement (GLUCERNA SHAKE) Liqd Take 237 mLs by mouth 2 (two) times daily between meals.   feeding supplement (PRO-STAT SUGAR FREE 64) Liqd Take 30 mLs by mouth 2 (two) times daily.   Fish Oil 1000 MG Caps Take 1,000 mg by mouth daily.   folic acid 376 MCG tablet Commonly known as: FOLVITE Take 400 mcg by mouth daily.   NOVOLOG FLEXPEN Sebastian Inject 0-10 Units into the skin See admin instructions. Per Sliding Scale ar 0600, 1100, 1600, 2100 100-150 0units 151-200 2units 201-250 4units 251-300 6units 301-350 8units 351-400 10units   insulin aspart 100 UNIT/ML injection Commonly known as: novoLOG Inject 6 Units into the skin 3 (three) times daily with meals.   insulin glargine 100 UNIT/ML injection Commonly known as: LANTUS Inject 0.15 mLs (15 Units total) into the skin at bedtime. What changed: how much to take   iron polysaccharides 150 MG capsule Commonly known as: NIFEREX Take 150 mg by mouth 2 (two) times daily.   magnesium oxide 400 MG tablet Commonly known as: MAG-OX Take 400 mg by mouth daily.   montelukast 10 MG tablet Commonly known as: Singulair Take 1 tablet (10 mg total) by mouth at bedtime.   multivitamin Tabs tablet Take 1 tablet by mouth daily.   nitroGLYCERIN 0.4 MG SL tablet Commonly known as: NITROSTAT Place  0.4 mg under the tongue every 5 (five) minutes as needed for chest pain.   oxyCODONE-acetaminophen 5-325 MG tablet Commonly known as: PERCOCET/ROXICET Take 1 tablet by mouth every 6 (six) hours as needed for severe pain.   pantoprazole 40 MG tablet Commonly known as: PROTONIX Take 1 tablet (40 mg total) by mouth daily at 6 (six) AM. Start taking on: August 03, 2018   pyridOXINE 100 MG tablet Commonly known as: VITAMIN B-6 Take 100 mg  by mouth daily.   Systane Balance 0.6 % Soln Generic drug: Propylene Glycol Apply 1 drop to eye 2 (two) times daily as needed (dry eyes).   torsemide 10 MG tablet Commonly known as: DEMADEX Take 2 tablets (20 mg total) by mouth 2 (two) times daily.   traZODone 50 MG tablet Commonly known as: DESYREL Take 50 mg at bedtime as needed by mouth for sleep.   vitamin B-12 1000 MCG tablet Commonly known as: CYANOCOBALAMIN Take 1,000 mcg by mouth daily.   Vitamin D (Ergocalciferol) 1.25 MG (50000 UT) Caps capsule Commonly known as: DRISDOL Take 50,000 Units by mouth 2 (two) times a week. MON and THUR   vitamin E 400 UNIT capsule Take 400 Units by mouth daily.   Zocor 20 MG tablet Generic drug: simvastatin Take 20 mg by mouth daily.      Allergies  Allergen Reactions  . Other Other (See Comments)    "all generics make her sick"   Follow-up Information    Burnard Bunting, MD. Schedule an appointment as soon as possible for a visit in 1 week(s).   Specialty: Internal Medicine Why: Hospital follow up Contact information: [address] [place] [address] [phone]            The results of significant diagnostics from this hospitalization (including imaging, microbiology, ancillary and laboratory) are listed below for reference.    Significant Diagnostic Studies: Dg Chest Port 1 View  Result Date: 08/01/2018 CLINICAL DATA:  Shortness of breath, COPD, asthma EXAM: PORTABLE CHEST 1 VIEW COMPARISON:  07/27/2018 FINDINGS: Cardiomegaly with vascular congestion. No confluent opacities or effusions. No acute bony abnormality. IMPRESSION: Cardiomegaly, vascular congestion. Electronically Signed   By: Rolm Baptise M.D.   On: 08/01/2018 13:03   Dg Chest Port 1 View  Result Date: 07/27/2018 CLINICAL DATA:  Respiratory distress EXAM: PORTABLE CHEST 1 VIEW COMPARISON:  Four days ago FINDINGS: Cardiomegaly with diffuse interstitial opacity, cephalized blood flow, Kerley  lines, and small pleural effusions. Lucency under the right diaphragm appears to be within bowel. IMPRESSION: CHF. Electronically Signed   By: Monte Fantasia M.D.   On: 07/27/2018 04:29   Dg Chest Portable 1 View  Result Date: 07/23/2018 CLINICAL DATA:  Chronic cough. EXAM: PORTABLE CHEST 1 VIEW COMPARISON:  Radiograph of July 05, 2018. FINDINGS: Stable cardiomegaly. Atherosclerosis of thoracic aorta is noted. No pneumothorax or pleural effusion is noted. Both lungs are clear. The visualized skeletal structures are unremarkable. IMPRESSION: No active disease. Aortic Atherosclerosis (ICD10-I70.0). Electronically Signed   By: Marijo Conception M.D.   On: 07/23/2018 15:34   Dg Chest Port 1 View  Result Date: 07/05/2018 CLINICAL DATA:  Renal failure.  Hypertension. EXAM: PORTABLE CHEST 1 VIEW COMPARISON:  June 16, 2018 FINDINGS: There is slight atelectasis in the left base. There is no edema or consolidation. Heart is borderline enlarged with pulmonary vascularity normal. No adenopathy. There is aortic atherosclerosis. There is degenerative change in each shoulder. IMPRESSION: No edema or consolidation. Slight left base atelectasis. Borderline  cardiac enlargement. Aortic Atherosclerosis (ICD10-I70.0). Electronically Signed   By: Lowella Grip III M.D.   On: 07/05/2018 12:25    Microbiology: Recent Results (from the past 240 hour(s))  SARS Coronavirus 2 (CEPHEID - Performed in [place] hospital lab), Hosp Order     Status: None   Collection Time: 07/23/18  4:50 PM   Specimen: Urine, Clean Catch; Nasopharyngeal  Result Value Ref Range Status   SARS Coronavirus 2 NEGATIVE NEGATIVE Final    Comment: (NOTE) If result is NEGATIVE SARS-CoV-2 target nucleic acids are NOT DETECTED. The SARS-CoV-2 RNA is generally detectable in upper and lower  respiratory specimens during the acute phase of infection. The lowest  concentration of SARS-CoV-2 viral copies this assay can detect is 250  copies / mL. A  negative result does not preclude SARS-CoV-2 infection  and should not be used as the sole basis for treatment or other  patient management decisions.  A negative result may occur with  improper specimen collection / handling, submission of specimen other  than nasopharyngeal swab, presence of viral mutation(s) within the  areas targeted by this assay, and inadequate number of viral copies  (<250 copies / mL). A negative result must be combined with clinical  observations, patient history, and epidemiological information. If result is POSITIVE SARS-CoV-2 target nucleic acids are DETECTED. The SARS-CoV-2 RNA is generally detectable in upper and lower  respiratory specimens dur ing the acute phase of infection.  Positive  results are indicative of active infection with SARS-CoV-2.  Clinical  correlation with patient history and other diagnostic information is  necessary to determine patient infection status.  Positive results do  not rule out bacterial infection or co-infection with other viruses. If result is PRESUMPTIVE POSTIVE SARS-CoV-2 nucleic acids MAY BE PRESENT.   A presumptive positive result was obtained on the submitted specimen  and confirmed on repeat testing.  While 2019 novel coronavirus  (SARS-CoV-2) nucleic acids may be present in the submitted sample  additional confirmatory testing may be necessary for epidemiological  and / or clinical management purposes  to differentiate between  SARS-CoV-2 and other Sarbecovirus currently known to infect humans.  If clinically indicated additional testing with an alternate test  methodology [phone]) is advised. The SARS-CoV-2 RNA is generally  detectable in upper and lower respiratory sp ecimens during the acute  phase of infection. The expected result is Negative. Fact Sheet for Patients:  StrictlyIdeas.no Fact Sheet for Healthcare Providers: BankingDealers.co.za This test is not  yet approved or cleared by the [place] FDA and has been authorized for detection and/or diagnosis of SARS-CoV-2 by FDA under an Emergency Use Authorization (EUA).  This EUA will remain in effect (meaning this test can be used) for the duration of the COVID-19 declaration under Section 564(b)(1) of the Act, 21 U.S.C. section 360bbb-3(b)(1), unless the authorization is terminated or revoked sooner. Performed at [facility], [place] [address], [place], [place] [address]   Novel Coronavirus, NAA (hospital order; send-out to ref lab)     Status: None   Collection Time: 07/30/18  3:55 PM   Specimen: Nasopharyngeal Swab; Respiratory  Result Value Ref Range Status   SARS-CoV-2, NAA NOT DETECTED NOT DETECTED Final    Comment: (NOTE) This test was developed and its performance characteristics determined by [employer]. This test has not been FDA cleared or approved. This test has been authorized by FDA under an Emergency Use Authorization (EUA). This test is only authorized for the duration of time  the declaration that circumstances exist justifying the authorization of the emergency use of in vitro diagnostic tests for detection of SARS-CoV-2 virus and/or diagnosis of COVID-19 infection under section 564(b)(1) of the Act, 21 U.S.C. 435WYS-1(U)(8), unless the authorization is terminated or revoked sooner. When diagnostic testing is negative, the possibility of a false negative result should be considered in the context of a patient's recent exposures and the presence of clinical signs and symptoms consistent with COVID-19. An individual without symptoms of COVID-19 and who is not shedding SARS-CoV-2 virus would expect to have a negative (not detected) result in this assay. Performed  At: [facility] [address] [place], [place] [id number] Rush Farmer MD BZ:[id number]    [place]  Final    Comment: Performed at [facility], [place] [address] [place], [place] [address]     Labs: Basic Metabolic Panel: Recent Labs  Lab 07/29/18 0408 07/30/18 0215 07/31/18 0300 08/01/18 0304 08/02/18 0700  NA 141 143 143 142 142  K 4.6 4.3 4.6 4.6 4.7  CL 107 105 104 102 103  CO2 24 28 28 31 30   GLUCOSE 169* 201* 155* 158* 101*  BUN 85* 83* 78* 87* 95*  CREATININE 2.52* 2.36* 2.28* 2.59* 2.55*  CALCIUM 8.8* 9.0 9.4 8.9 8.8*   Liver Function Tests: No results for input(s): AST, ALT, ALKPHOS, BILITOT, PROT, ALBUMIN in the last 168 hours. No results for input(s): LIPASE, AMYLASE in the last 168 hours. No results for input(s): AMMONIA in the last 168 hours. CBC: Recent Labs  Lab 07/27/18 0256 07/29/18 0408 07/30/18 0215 08/01/18 0304 08/02/18 0700  WBC 10.0 9.4 10.5 10.3  --   HGB 8.1* 7.7* 8.2* 8.3* 8.6*  HCT 25.7* 24.6* 25.5* 26.2* 27.6*  MCV 90.8 91.8 90.1 90.7  --   PLT 247 268 282 299  --    Cardiac Enzymes: No results for input(s): CKTOTAL, CKMB, CKMBINDEX, TROPONINI in the last 168 hours. BNP: BNP (last 3 results) Recent Labs    06/17/18 1601 06/20/18 0707 07/23/18 1459  BNP 194.2* 156.2* 466.8*    ProBNP (last 3 results) No results for input(s): PROBNP in the last 8760 hours.  CBG: Recent Labs  Lab 08/01/18 1205 08/01/18 1623 08/01/18 2120 08/02/18 0811 08/02/18 1230  GLUCAP 183* 157* 137* 96 189*       Signed:  Nakya Weyand  [facility] 08/02/2018, 1:04 PM

## 2018-09-01 ENCOUNTER — Encounter (HOSPITAL_COMMUNITY): Payer: Self-pay | Admitting: Emergency Medicine

## 2018-09-01 ENCOUNTER — Emergency Department (HOSPITAL_COMMUNITY)
Admission: EM | Admit: 2018-09-01 | Discharge: 2018-09-01 | Disposition: A | Discharge status: Home / Self Care | Payer: Medicare Other | Source: Home / Self Care | Attending: Emergency Medicine | Admitting: Emergency Medicine

## 2018-09-01 ENCOUNTER — Other Ambulatory Visit: Payer: Self-pay

## 2018-09-01 ENCOUNTER — Emergency Department (HOSPITAL_COMMUNITY): Payer: Medicare Other

## 2018-09-01 DIAGNOSIS — R059 Cough, unspecified: Secondary | ICD-10-CM

## 2018-09-01 DIAGNOSIS — J810 Acute pulmonary edema: Secondary | ICD-10-CM | POA: Insufficient documentation

## 2018-09-01 DIAGNOSIS — R05 Cough: Secondary | ICD-10-CM

## 2018-09-01 DIAGNOSIS — I130 Hypertensive heart and chronic kidney disease with heart failure and stage 1 through stage 4 chronic kidney disease, or unspecified chronic kidney disease: Secondary | ICD-10-CM | POA: Diagnosis not present

## 2018-09-01 DIAGNOSIS — J449 Chronic obstructive pulmonary disease, unspecified: Secondary | ICD-10-CM | POA: Insufficient documentation

## 2018-09-01 DIAGNOSIS — Z87891 Personal history of nicotine dependence: Secondary | ICD-10-CM | POA: Insufficient documentation

## 2018-09-01 DIAGNOSIS — R0602 Shortness of breath: Secondary | ICD-10-CM | POA: Diagnosis not present

## 2018-09-01 DIAGNOSIS — E119 Type 2 diabetes mellitus without complications: Secondary | ICD-10-CM | POA: Insufficient documentation

## 2018-09-01 DIAGNOSIS — Z79899 Other long term (current) drug therapy: Secondary | ICD-10-CM | POA: Insufficient documentation

## 2018-09-01 LAB — CBC WITH DIFFERENTIAL/PLATELET
Abs Immature Granulocytes: 0.03 10*3/uL (ref 0.00–0.07)
Basophils Absolute: 0 10*3/uL (ref 0.0–0.1)
Basophils Relative: 0 %
Eosinophils Absolute: 0.5 10*3/uL (ref 0.0–0.5)
Eosinophils Relative: 6 %
HCT: 29.3 % — ABNORMAL LOW (ref 36.0–46.0)
Hemoglobin: 8.6 g/dL — ABNORMAL LOW (ref 12.0–15.0)
Immature Granulocytes: 0 %
Lymphocytes Relative: 8 %
Lymphs Abs: 0.7 10*3/uL (ref 0.7–4.0)
MCH: 29.1 pg (ref 26.0–34.0)
MCHC: 29.4 g/dL — ABNORMAL LOW (ref 30.0–36.0)
MCV: 99 fL (ref 80.0–100.0)
Monocytes Absolute: 0.5 10*3/uL (ref 0.1–1.0)
Monocytes Relative: 6 %
Neutro Abs: 6.3 10*3/uL (ref 1.7–7.7)
Neutrophils Relative %: 80 %
Platelets: 216 10*3/uL (ref 150–400)
RBC: 2.96 MIL/uL — ABNORMAL LOW (ref 3.87–5.11)
RDW: 18.3 % — ABNORMAL HIGH (ref 11.5–15.5)
WBC: 8.1 10*3/uL (ref 4.0–10.5)
nRBC: 0 % (ref 0.0–0.2)

## 2018-09-01 LAB — COMPREHENSIVE METABOLIC PANEL
ALT: 27 U/L (ref 0–44)
AST: 19 U/L (ref 15–41)
Albumin: 3.1 g/dL — ABNORMAL LOW (ref 3.5–5.0)
Alkaline Phosphatase: 58 U/L (ref 38–126)
Anion gap: 10 (ref 5–15)
BUN: 95 mg/dL — ABNORMAL HIGH (ref 8–23)
CO2: 25 mmol/L (ref 22–32)
Calcium: 8.4 mg/dL — ABNORMAL LOW (ref 8.9–10.3)
Chloride: 107 mmol/L (ref 98–111)
Creatinine, Ser: 2.74 mg/dL — ABNORMAL HIGH (ref 0.44–1.00)
GFR calc Af Amer: 19 mL/min — ABNORMAL LOW (ref >60)
GFR calc non Af Amer: 16 mL/min — ABNORMAL LOW (ref >60)
Glucose, Bld: 187 mg/dL — ABNORMAL HIGH (ref 70–99)
Potassium: 5.5 mmol/L — ABNORMAL HIGH (ref 3.5–5.1)
Sodium: 142 mmol/L (ref 135–145)
Total Bilirubin: 0.8 mg/dL (ref 0.3–1.2)
Total Protein: 7.3 g/dL (ref 6.5–8.1)

## 2018-09-01 LAB — LACTIC ACID, PLASMA
Lactic Acid, Venous: 0.6 mmol/L (ref 0.5–1.9)
Lactic Acid, Venous: 0.6 mmol/L (ref 0.5–1.9)

## 2018-09-01 LAB — BRAIN NATRIURETIC PEPTIDE: B Natriuretic Peptide: 512.4 pg/mL — ABNORMAL HIGH (ref 0.0–100.0)

## 2018-09-01 NOTE — ED Notes (Signed)
Discharge instructions discussed with pt. Pt verbalized understanding with no questions at this time. Pt states comfortable with going to [facility]. RN to call [facility] for transport d/t need O2 and mobility restrictions

## 2018-09-01 NOTE — ED Notes (Signed)
Discharge instructions discussed with RN Darius at [facility].   RN Darius states to call back RN within 5-10 min in regards to pt refusal of COVID test

## 2018-09-01 NOTE — Discharge Instructions (Signed)
Your work-up today showed improvement from the imaging and labs you had yesterday.  The x-ray does not show pneumonia, given her lack of fevers or elevated white blood cell count, we have a low suspicion for occult pneumonia.  What we did see was evidence of some mild pulmonary edema.  Your oxygen saturations are normal on your home oxygen amount thus we do not feel you require admission at this time.  We offered the coronavirus test to you but you did not want it.  Your kidney function was improved from yesterday based on the paperwork you arrived with.  Your potassium was also improved.  Please follow-up with your primary doctor for further management of your pulmonary edema and kidney dysfunction however we did not find any compelling reason to admit you given your improvement in symptoms and otherwise well appearance.  If any symptoms change or worsen, please return to the nearest emergency department.

## 2018-09-01 NOTE — ED Notes (Signed)
Pt. Discharge with [facility] at this time.

## 2018-09-01 NOTE — ED Notes (Signed)
Darius RN at [facility] confirmed pt to return to facility RM 117

## 2018-09-01 NOTE — ED Notes (Signed)
[facility] at bedside picking up pt.

## 2018-09-01 NOTE — ED Notes (Signed)
Secretary: Magda Paganini to call [facility] for transport

## 2018-09-01 NOTE — ED Triage Notes (Signed)
Pt arrives EMS from nursing facility. EMS was called out to the facility bc the pt was diagnosed with pneumonia yesterday at facility via xray. The NP at the facility stated that she needed to be evaluated. Pt has no complaints per EMS. HX of DM & chronic kidney disease. Pt on 2L of o2 [place] baseline.

## 2018-09-01 NOTE — ED Notes (Signed)
EDP at bedside  

## 2018-09-01 NOTE — ED Provider Notes (Signed)
[facility] EMERGENCY DEPARTMENT Provider Note   CSN: [id number] Arrival date & time: 09/01/18  1413     History   Chief Complaint No chief complaint on file.   HPI [name] is a 78 y.o. female.     The history is provided by the patient and medical records. No language interpreter was used.  Cough Cough characteristics:  Productive Sputum characteristics:  Nondescript Severity:  Moderate Onset quality:  Gradual Duration:  2 days Timing:  Constant Progression:  Waxing and waning Chronicity:  New Relieved by:  Nothing Worsened by:  Nothing Ineffective treatments:  None tried Associated symptoms: chills   Associated symptoms: no chest pain, no diaphoresis, no fever, no headaches, no rash, no rhinorrhea, no shortness of breath and no wheezing     Past Medical History:  Diagnosis Date   Adenomatous colon polyp    Anemia    Arthritis    Asthma    COPD (chronic obstructive pulmonary disease) ([place])    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/2011   Depression    Diabetes mellitus    Diverticulosis    Gastric ulcer    GERD (gastroesophageal reflux disease)    HTN (hypertension)    Hypercholesterolemia    Internal hemorrhoids    Lumbar disc disease    On home oxygen therapy    "2-3L @ bedtime" (04/04/2017)   OSA (obstructive sleep apnea) 09/17/2012   Peptic ulcer disease    Renal oncocytoma 2011   ablated by [place]   Umbilical hernia    Valgus foot    right     Patient Active Problem List   Diagnosis Date Noted   Pressure injury of skin 07/24/2018   Aortic atherosclerosis ([place]) 07/24/2018   Occult blood positive stool    Anemia due to GI blood loss 07/23/2018   Essential hypertension 07/05/2018   CHF (congestive heart failure), NYHA class IV, chronic, combined ([place]) 07/05/2018   Diabetic hyperosmolar non-ketotic state ([place]) 06/17/2018   Hyperglycemia 06/16/2018   Type 2 diabetes mellitus ([place]) 06/16/2018   UTI  (urinary tract infection) 06/16/2018   Sacral decubitus ulcer 06/16/2018   Hypertensive urgency 04/04/2017   CKD (chronic kidney disease) stage 4, GFR 15-29 ml/min (HCC) 04/04/2017   Hypoglycemia    Metabolic acidosis with normal anion gap and bicarbonate losses 11/23/2016   Midfoot ulcer, left, limited to breakdown of skin ([place]) 06/13/2016   Idiopathic chronic venous hypertension of both lower extremities with inflammation 03/10/2016   Onychomycosis 03/10/2016   Chronic obstructive pulmonary disease ([place]) 11/07/2013   Upper airway cough syndrome 11/07/2013   Insomnia 12/10/2012   OSA (obstructive sleep apnea) 09/17/2012   Benign neoplasm of colon 08/14/2012   Rectal bleeding 08/13/2012    Class: Acute   Anemia of chronic disease 08/13/2012    Class: Acute   Osteoarthritis 08/13/2012    Class: Chronic   Acute posthemorrhagic anemia 08/13/2012   Diverticulosis of colon (without mention of hemorrhage) 08/13/2012   Hoarseness 07/20/2011   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  [place]   COLONOSCOPY N/A 08/14/2012   Procedure: COLONOSCOPY;  Surgeon: Ladene Artist, MD;  Location: [facility] ENDOSCOPY;  Service: Endoscopy;  Laterality: N/A;   ESOPHAGOGASTRODUODENOSCOPY (EGD) WITH PROPOFOL N/A 07/25/2018   Procedure: ESOPHAGOGASTRODUODENOSCOPY (EGD) WITH PROPOFOL;  Surgeon: Gatha Mayer, MD;  Location: [facility];  Service: Endoscopy;  Laterality: N/A;   INCISE AND DRAIN ABCESS     Abdominal Wall   LIPOMA RESECTION     Right Neck   TOTAL KNEE ARTHROPLASTY     Bilateral   UMBILICAL HERNIA REPAIR  2007   incarcerated   UPPER GASTROINTESTINAL ENDOSCOPY  2001   medoff Grade  A esophagitis     OB History   No obstetric history on file.      Home Medications    Prior to Admission medications   Medication Sig Start Date End Date Taking? Authorizing Provider  acetaminophen (TYLENOL) 325 MG tablet Take 650 mg by mouth every 6 (six) hours as needed for mild pain.    [provider]  Amino Acids-Protein Hydrolys (FEEDING SUPPLEMENT, PRO-STAT SUGAR FREE 64,) LIQD Take 30 mLs by mouth 2 (two) times daily. 06/20/18   [place] Eric J, DO  amLODipine (NORVASC) 10 MG tablet Take 10 mg by mouth daily.     [provider]  calcitRIOL (ROCALTROL) 0.25 MCG capsule Take 1 capsule (0.25 mcg total) by mouth every Monday, Wednesday, and Friday. 12/05/16   Velvet Bathe, MD  Calcium Carbonate-Vitamin D (CALTRATE 600+D) 600-400 MG-UNIT tablet Take 1 tablet by mouth daily.     [provider]  collagenase (SANTYL) ointment Apply 1 application topically daily.    [provider]  cyclobenzaprine (FLEXERIL) 10 MG tablet Take 10 mg 3 (three) times daily as needed by mouth for muscle spasms.    [provider]  feeding supplement, GLUCERNA SHAKE, (GLUCERNA SHAKE) LIQD Take 237 mLs by mouth 2 (two) times daily between meals. 08/02/18   Mikhail, Velta Addison, DO  folic acid (FOLVITE) 952 MCG tablet Take 400 mcg by mouth daily.    [provider]  Insulin Aspart (NOVOLOG FLEXPEN [place]) Inject 0-10 Units into the skin See admin instructions. Per Sliding Scale ar 0600, 1100, 1600, 2100 100-150 0units 151-200 2units 201-250 4units 251-300 6units 301-350 8units 351-400 10units    [provider]  insulin aspart (NOVOLOG) 100 UNIT/ML injection Inject 6 Units into the skin 3 (three) times daily with meals. 06/20/18   [place] Eric J, DO  insulin glargine (LANTUS) 100 UNIT/ML injection Inject 0.15 mLs (15 Units total) into the skin at bedtime. 08/02/18   Cristal Ford, DO  iron polysaccharides (NIFEREX) 150 MG capsule Take 150 mg by mouth 2  (two) times daily.    [provider]  magnesium oxide (MAG-OX) 400 MG tablet Take 400 mg by mouth daily.    [provider]  montelukast (SINGULAIR) 10 MG tablet Take 1 tablet (10 mg total) by mouth at bedtime. 10/05/16   Chesley Mires, MD  multivitamin (PROSIGHT) TABS tablet Take 1 tablet by mouth daily. 08/02/18   Mikhail, Velta Addison, DO  nitroGLYCERIN (NITROSTAT) 0.4 MG SL tablet Place 0.4 mg under the tongue every 5 (five) minutes as needed for chest pain.    [provider]  Omega-3 Fatty Acids (FISH OIL) 1000 MG CAPS Take 1,000 mg by mouth daily.     [provider]  oxyCODONE-acetaminophen (PERCOCET/ROXICET) 5-325 MG tablet Take 1 tablet by mouth every 6 (six) hours as needed for severe pain. 08/02/18   Mikhail, Velta Addison, DO  pantoprazole (PROTONIX) 40 MG tablet Take 1 tablet (40 mg total) by mouth daily at 6 (six) AM. 08/03/18  Mikhail, Velta Addison, DO  Propylene Glycol (SYSTANE BALANCE) 0.6 % SOLN Apply 1 drop to eye 2 (two) times daily as needed (dry eyes).     [provider]  pyridOXINE (VITAMIN B-6) 100 MG tablet Take 100 mg by mouth daily.    [provider]  torsemide (DEMADEX) 10 MG tablet Take 2 tablets (20 mg total) by mouth 2 (two) times daily. 07/09/18   Domenic Polite, MD  traZODone (DESYREL) 50 MG tablet Take 50 mg at bedtime as needed by mouth for sleep.    [provider]  vitamin B-12 (CYANOCOBALAMIN) 1000 MCG tablet Take 1,000 mcg by mouth daily.    [provider]  Vitamin D, Ergocalciferol, (DRISDOL) 50000 UNITS CAPS capsule Take 50,000 Units by mouth 2 (two) times a week. MON and THUR 04/07/14   [provider]  vitamin E 400 UNIT capsule Take 400 Units by mouth daily.    [provider]  ZOCOR 20 MG tablet Take 20 mg by mouth daily. 03/16/14   [provider]    Family History Family History  Problem Relation Age of Onset   Breast cancer Maternal Aunt     Social History Social  History   Tobacco Use   Smoking status: Former Smoker    Packs/day: 2.00    Years: 5.00    Pack years: 10.00    Types: Cigarettes    Quit date: 01/10/1958    Years since quitting: 60.6   Smokeless tobacco: Never Used  Substance Use Topics   Alcohol use: No    Comment: Quit when she quit smoking   Drug use: No     Allergies   Other   Review of Systems Review of Systems  Constitutional: Positive for chills. Negative for diaphoresis, fatigue and fever.  HENT: Negative for congestion and rhinorrhea.   Respiratory: Positive for cough. Negative for chest tightness, shortness of breath and wheezing.   Cardiovascular: Negative for chest pain.  Gastrointestinal: Negative for abdominal pain, constipation, diarrhea, nausea and vomiting.  Genitourinary: Negative for dysuria and flank pain.  Musculoskeletal: Negative for back pain, neck pain and neck stiffness.  Skin: Negative for rash and wound.  Neurological: Negative for weakness, light-headedness, numbness and headaches.  Psychiatric/Behavioral: Negative for agitation.  All other systems reviewed and are negative.    Physical Exam Updated Vital Signs BP (!) 168/75    Pulse 68    Temp 98.9 F (37.2 C) (Rectal)    Resp (!) 24    SpO2 94% Comment: data not tranferring, manual input  Physical Exam Vitals signs and nursing note reviewed.  Constitutional:      General: She is not in acute distress.    Appearance: She is well-developed. She is not ill-appearing, toxic-appearing or diaphoretic.  HENT:     Head: Normocephalic and atraumatic.     Right Ear: External ear normal.     Left Ear: External ear normal.     Nose: Nose normal. No congestion or rhinorrhea.     Mouth/Throat:     Pharynx: No oropharyngeal exudate.  Eyes:     Conjunctiva/sclera: Conjunctivae normal.     Pupils: Pupils are equal, round, and reactive to light.  Neck:     Musculoskeletal: Normal range of motion and neck supple. No muscular tenderness.    Cardiovascular:     Rate and Rhythm: Normal rate.     Pulses: Normal pulses.     Heart sounds: Murmur present.  Pulmonary:     Effort:  Pulmonary effort is normal. No respiratory distress.     Breath sounds: No stridor. Rhonchi present. No wheezing or rales.  Chest:     Chest wall: No tenderness.  Abdominal:     General: There is no distension.     Tenderness: There is no abdominal tenderness. There is no right CVA tenderness, left CVA tenderness or rebound.  Musculoskeletal:        General: No tenderness.     Right lower leg: Edema present.     Left lower leg: Edema present.  Skin:    General: Skin is warm.     Capillary Refill: Capillary refill takes less than 2 seconds.     Findings: No erythema or rash.  Neurological:     General: No focal deficit present.     Mental Status: She is alert and oriented to person, place, and time.     Motor: No abnormal muscle tone.     Coordination: Coordination normal.     Deep Tendon Reflexes: Reflexes are normal and symmetric.      ED Treatments / Results  Labs (all labs ordered are listed, but only abnormal results are displayed) Labs Reviewed  CBC WITH DIFFERENTIAL/PLATELET - Abnormal; Notable for the following components:      Result Value   RBC 2.96 (*)    Hemoglobin 8.6 (*)    HCT 29.3 (*)    MCHC 29.4 (*)    RDW 18.3 (*)    All other components within normal limits  COMPREHENSIVE METABOLIC PANEL - Abnormal; Notable for the following components:   Potassium 5.5 (*)    Glucose, Bld 187 (*)    BUN 95 (*)    Creatinine, Ser 2.74 (*)    Calcium 8.4 (*)    Albumin 3.1 (*)    GFR calc non Af Amer 16 (*)    GFR calc Af Amer 19 (*)    All other components within normal limits  BRAIN NATRIURETIC PEPTIDE - Abnormal; Notable for the following components:   B Natriuretic Peptide 512.4 (*)    All other components within normal limits  CULTURE, BLOOD (ROUTINE X 2)  CULTURE, BLOOD (ROUTINE X 2)  LACTIC ACID, PLASMA  LACTIC ACID,  PLASMA    EKG None ED ECG REPORT   Date: 09/01/2018  Rate: 79  Rhythm: normal sinus rhythm  QRS Axis: normal  Intervals: normal  ST/T Wave abnormalities: normal  Conduction Disutrbances:consider LVH  Narrative Interpretation:   Old EKG Reviewed: unchanged  I have personally reviewed the EKG tracing and agree with the computerized printout as noted.   Radiology Dg Chest 2 View  Result Date: 09/01/2018 CLINICAL DATA:  Chills and cough. EXAM: CHEST - 2 VIEW COMPARISON:  08/01/2018 FINDINGS: Low volumes. The cardio pericardial silhouette is enlarged. Pulmonary vascular congestion noted with interstitial pulmonary edema pattern. No substantial pleural effusion. The visualized bony structures of the thorax are intact. Degenerative changes noted left shoulder. IMPRESSION: Cardiomegaly with interstitial pulmonary edema. Electronically Signed   By: Misty Stanley M.D.   On: 09/01/2018 16:12    Procedures Procedures (including critical care time)  Medications Ordered in ED Medications - No data to display   Initial Impression / Assessment and Plan / ED Course  I have reviewed the triage vital signs and the nursing notes.  Pertinent labs & imaging results that were available during my care of the patient were reviewed by me and considered in my medical decision making (see chart for details).  Elton Catalano Lesniak is a 78 y.o. female with a past medical history significant for COPD and asthma on 2 L home oxygen, prior GI bleed, diverticulosis, hypertension, CHF, depression, and sleep apnea who presents from her rehab facility with concern for pneumonia and acute kidney injury.  Patient has had cough for the last few days and had a chest x-ray performed yesterday.  I spoke with the facility to get this information.  She reports that the blood work and x-ray were finalized today showing concern for pneumonia as well as acute kidney injury.  Patient was sent to the emergency department  medially for further management.  Symptomatically, patient is only having some cough but denies significant shortness of breath, chest pain, palpitations, urinary symptoms or GI symptoms.  She does report some chills.  She denies any current pain.  On exam, patient does have rhonchi both sides of her lungs.  Patient has a systolic murmur.  Abdomen and chest are nontender.  Back and flanks are nontender.  No CVA tenderness.  Patient moving all extremities.  She does have peripheral edema and swelling in her right leg.  She reports this is at her baseline.  She is on 3 L nasal cannula for oxygen management.  Oxygen saturation is 91%.  Facility reported she had no new symptoms today but with the pneumonia and AKI she need to be seen here for evaluation.  Will work-up the patient to look for the pneumonia or worsen lab findings.  Will determine if patient is approved for discharge home after work-up.  Patient currently resting comfortably on 3 L nasal cannula.  7:15 PM Patient's lactic acid was normal x2 and no leukocytosis.  She was not febrile.  She was on similar oxygen requirement to home and had no complaints aside from a mild cough.  X-ray shows no pneumonia but does show some mild pulmonary edema.  Patient's BNP is slightly worsened than prior.  Patient's kidney function is slightly more elevated however as she is on her home oxygen and we did not see pneumonia and her potassium and kidney function are improved from yesterday, we do not feel she needs to be admitted at this time.  Patient would like to go home.  She refused coronavirus test when it was offered.  We discussed how she will have a fluid balance problem with the slightly increased kidney function and edema however she wants to follow-up with PCP for further management of this.  Patient will follow-up with PCP and understood return precautions.  She had no questions or concerns and was discharged in good condition.  7:30 PM Had a  shared decision-making conversation with patient again as she was still slightly tachypneic.  Patient is back on her home 2 L and is having an oxygen saturation of 97%.  She is breathing quickly but says she still wants to go home.  We offered her admission for the pulmonary edema likely contributing to her tachypnea but she still does not feel short of breath and does not want to be admitted.  She understands that if she gets back to her facility and has worsened breathing, she needs to return to the nearest emergency department and will likely require admission for diuresis and further management of pulmonary edema.  Patient still agrees with this plan and wants to go home.  Patient will be discharged.   Final Clinical Impressions(s) / ED Diagnoses   Final diagnoses:  Cough  Acute pulmonary edema ([place])  ED Discharge Orders    None      Clinical Impression: 1. Cough   2. Acute pulmonary edema (HCC)     Disposition: Discharge  Condition: Good  I have discussed the results, Dx and Tx plan with the pt(& family if present). He/she/they expressed understanding and agree(s) with the plan. Discharge instructions discussed at great length. Strict return precautions discussed and pt &/or family have verbalized understanding of the instructions. No further questions at time of discharge.    New Prescriptions   No medications on file    Follow Up: Burnard Bunting, [place] [address] [place] [address] [id number] mc [place]       Laycie Schriner, Gwenyth Allegra, MD 09/01/18 [phone]

## 2018-09-01 NOTE — ED Notes (Signed)
Attempted to call [facility] to inform pt departure no answer at facility. [facility] informed

## 2018-09-01 NOTE — ED Notes (Signed)
Called [facility] for transport back to [facility]

## 2018-09-03 ENCOUNTER — Emergency Department (HOSPITAL_COMMUNITY): Payer: Medicare Other

## 2018-09-03 ENCOUNTER — Other Ambulatory Visit: Payer: Self-pay

## 2018-09-03 ENCOUNTER — Inpatient Hospital Stay (HOSPITAL_COMMUNITY)
Admission: EM | Admit: 2018-09-03 | Discharge: 2018-09-21 | DRG: 291 | Disposition: A | Discharge status: Skilled Nursing Facility | Payer: Medicare Other | Attending: Internal Medicine | Admitting: Internal Medicine

## 2018-09-03 ENCOUNTER — Encounter (HOSPITAL_COMMUNITY): Payer: Self-pay | Admitting: Emergency Medicine

## 2018-09-03 DIAGNOSIS — Z87891 Personal history of nicotine dependence: Secondary | ICD-10-CM

## 2018-09-03 DIAGNOSIS — N179 Acute kidney failure, unspecified: Secondary | ICD-10-CM | POA: Diagnosis present

## 2018-09-03 DIAGNOSIS — I313 Pericardial effusion (noninflammatory): Secondary | ICD-10-CM | POA: Diagnosis present

## 2018-09-03 DIAGNOSIS — I130 Hypertensive heart and chronic kidney disease with heart failure and stage 1 through stage 4 chronic kidney disease, or unspecified chronic kidney disease: Principal | ICD-10-CM | POA: Diagnosis present

## 2018-09-03 DIAGNOSIS — I2721 Secondary pulmonary arterial hypertension: Secondary | ICD-10-CM | POA: Diagnosis present

## 2018-09-03 DIAGNOSIS — J441 Chronic obstructive pulmonary disease with (acute) exacerbation: Secondary | ICD-10-CM | POA: Diagnosis present

## 2018-09-03 DIAGNOSIS — N289 Disorder of kidney and ureter, unspecified: Secondary | ICD-10-CM | POA: Diagnosis not present

## 2018-09-03 DIAGNOSIS — J9622 Acute and chronic respiratory failure with hypercapnia: Secondary | ICD-10-CM | POA: Diagnosis present

## 2018-09-03 DIAGNOSIS — I083 Combined rheumatic disorders of mitral, aortic and tricuspid valves: Secondary | ICD-10-CM | POA: Diagnosis present

## 2018-09-03 DIAGNOSIS — R778 Other specified abnormalities of plasma proteins: Secondary | ICD-10-CM

## 2018-09-03 DIAGNOSIS — L89222 Pressure ulcer of left hip, stage 2: Secondary | ICD-10-CM | POA: Diagnosis present

## 2018-09-03 DIAGNOSIS — Z6830 Body mass index (BMI) 30.0-30.9, adult: Secondary | ICD-10-CM

## 2018-09-03 DIAGNOSIS — N184 Chronic kidney disease, stage 4 (severe): Secondary | ICD-10-CM | POA: Diagnosis present

## 2018-09-03 DIAGNOSIS — L97529 Non-pressure chronic ulcer of other part of left foot with unspecified severity: Secondary | ICD-10-CM | POA: Diagnosis present

## 2018-09-03 DIAGNOSIS — Z8711 Personal history of peptic ulcer disease: Secondary | ICD-10-CM

## 2018-09-03 DIAGNOSIS — Z79899 Other long term (current) drug therapy: Secondary | ICD-10-CM

## 2018-09-03 DIAGNOSIS — J449 Chronic obstructive pulmonary disease, unspecified: Secondary | ICD-10-CM | POA: Diagnosis present

## 2018-09-03 DIAGNOSIS — G4733 Obstructive sleep apnea (adult) (pediatric): Secondary | ICD-10-CM | POA: Diagnosis present

## 2018-09-03 DIAGNOSIS — I5043 Acute on chronic combined systolic (congestive) and diastolic (congestive) heart failure: Secondary | ICD-10-CM | POA: Diagnosis present

## 2018-09-03 DIAGNOSIS — L89613 Pressure ulcer of right heel, stage 3: Secondary | ICD-10-CM | POA: Diagnosis present

## 2018-09-03 DIAGNOSIS — E11621 Type 2 diabetes mellitus with foot ulcer: Secondary | ICD-10-CM | POA: Diagnosis present

## 2018-09-03 DIAGNOSIS — D649 Anemia, unspecified: Secondary | ICD-10-CM | POA: Diagnosis not present

## 2018-09-03 DIAGNOSIS — M7989 Other specified soft tissue disorders: Secondary | ICD-10-CM | POA: Diagnosis not present

## 2018-09-03 DIAGNOSIS — Z4659 Encounter for fitting and adjustment of other gastrointestinal appliance and device: Secondary | ICD-10-CM

## 2018-09-03 DIAGNOSIS — E1122 Type 2 diabetes mellitus with diabetic chronic kidney disease: Secondary | ICD-10-CM | POA: Diagnosis present

## 2018-09-03 DIAGNOSIS — I5033 Acute on chronic diastolic (congestive) heart failure: Secondary | ICD-10-CM | POA: Diagnosis not present

## 2018-09-03 DIAGNOSIS — G9340 Encephalopathy, unspecified: Secondary | ICD-10-CM | POA: Diagnosis not present

## 2018-09-03 DIAGNOSIS — I10 Essential (primary) hypertension: Secondary | ICD-10-CM | POA: Diagnosis not present

## 2018-09-03 DIAGNOSIS — J9621 Acute and chronic respiratory failure with hypoxia: Secondary | ICD-10-CM | POA: Diagnosis present

## 2018-09-03 DIAGNOSIS — T17908A Unspecified foreign body in respiratory tract, part unspecified causing other injury, initial encounter: Secondary | ICD-10-CM

## 2018-09-03 DIAGNOSIS — R579 Shock, unspecified: Secondary | ICD-10-CM

## 2018-09-03 DIAGNOSIS — Z9119 Patient's noncompliance with other medical treatment and regimen: Secondary | ICD-10-CM

## 2018-09-03 DIAGNOSIS — T502X5A Adverse effect of carbonic-anhydrase inhibitors, benzothiadiazides and other diuretics, initial encounter: Secondary | ICD-10-CM | POA: Diagnosis present

## 2018-09-03 DIAGNOSIS — Z9289 Personal history of other medical treatment: Secondary | ICD-10-CM

## 2018-09-03 DIAGNOSIS — L89620 Pressure ulcer of left heel, unstageable: Secondary | ICD-10-CM | POA: Diagnosis present

## 2018-09-03 DIAGNOSIS — Z9981 Dependence on supplemental oxygen: Secondary | ICD-10-CM

## 2018-09-03 DIAGNOSIS — E875 Hyperkalemia: Secondary | ICD-10-CM | POA: Diagnosis present

## 2018-09-03 DIAGNOSIS — G9341 Metabolic encephalopathy: Secondary | ICD-10-CM | POA: Diagnosis not present

## 2018-09-03 DIAGNOSIS — R0602 Shortness of breath: Secondary | ICD-10-CM | POA: Diagnosis present

## 2018-09-03 DIAGNOSIS — Z8249 Family history of ischemic heart disease and other diseases of the circulatory system: Secondary | ICD-10-CM

## 2018-09-03 DIAGNOSIS — Z20828 Contact with and (suspected) exposure to other viral communicable diseases: Secondary | ICD-10-CM | POA: Diagnosis present

## 2018-09-03 DIAGNOSIS — J9602 Acute respiratory failure with hypercapnia: Secondary | ICD-10-CM | POA: Diagnosis not present

## 2018-09-03 DIAGNOSIS — I509 Heart failure, unspecified: Secondary | ICD-10-CM | POA: Diagnosis not present

## 2018-09-03 DIAGNOSIS — E669 Obesity, unspecified: Secondary | ICD-10-CM | POA: Diagnosis present

## 2018-09-03 DIAGNOSIS — Z515 Encounter for palliative care: Secondary | ICD-10-CM | POA: Diagnosis not present

## 2018-09-03 DIAGNOSIS — J4489 Other specified chronic obstructive pulmonary disease: Secondary | ICD-10-CM | POA: Diagnosis present

## 2018-09-03 DIAGNOSIS — E785 Hyperlipidemia, unspecified: Secondary | ICD-10-CM | POA: Diagnosis present

## 2018-09-03 DIAGNOSIS — J9601 Acute respiratory failure with hypoxia: Secondary | ICD-10-CM

## 2018-09-03 DIAGNOSIS — E119 Type 2 diabetes mellitus without complications: Secondary | ICD-10-CM

## 2018-09-03 DIAGNOSIS — N189 Chronic kidney disease, unspecified: Secondary | ICD-10-CM

## 2018-09-03 DIAGNOSIS — R0682 Tachypnea, not elsewhere classified: Secondary | ICD-10-CM

## 2018-09-03 DIAGNOSIS — Z452 Encounter for adjustment and management of vascular access device: Secondary | ICD-10-CM

## 2018-09-03 DIAGNOSIS — Z96659 Presence of unspecified artificial knee joint: Secondary | ICD-10-CM | POA: Diagnosis present

## 2018-09-03 DIAGNOSIS — E876 Hypokalemia: Secondary | ICD-10-CM | POA: Diagnosis not present

## 2018-09-03 DIAGNOSIS — Z794 Long term (current) use of insulin: Secondary | ICD-10-CM | POA: Diagnosis not present

## 2018-09-03 DIAGNOSIS — D638 Anemia in other chronic diseases classified elsewhere: Secondary | ICD-10-CM | POA: Diagnosis not present

## 2018-09-03 DIAGNOSIS — I350 Nonrheumatic aortic (valve) stenosis: Secondary | ICD-10-CM | POA: Diagnosis not present

## 2018-09-03 DIAGNOSIS — M1990 Unspecified osteoarthritis, unspecified site: Secondary | ICD-10-CM | POA: Diagnosis present

## 2018-09-03 DIAGNOSIS — R1310 Dysphagia, unspecified: Secondary | ICD-10-CM | POA: Diagnosis not present

## 2018-09-03 DIAGNOSIS — I361 Nonrheumatic tricuspid (valve) insufficiency: Secondary | ICD-10-CM | POA: Diagnosis not present

## 2018-09-03 DIAGNOSIS — K210 Gastro-esophageal reflux disease with esophagitis: Secondary | ICD-10-CM | POA: Diagnosis present

## 2018-09-03 DIAGNOSIS — R7989 Other specified abnormal findings of blood chemistry: Secondary | ICD-10-CM | POA: Diagnosis not present

## 2018-09-03 DIAGNOSIS — I82612 Acute embolism and thrombosis of superficial veins of left upper extremity: Secondary | ICD-10-CM | POA: Diagnosis present

## 2018-09-03 DIAGNOSIS — R0600 Dyspnea, unspecified: Secondary | ICD-10-CM

## 2018-09-03 DIAGNOSIS — I340 Nonrheumatic mitral (valve) insufficiency: Secondary | ICD-10-CM | POA: Diagnosis not present

## 2018-09-03 DIAGNOSIS — J90 Pleural effusion, not elsewhere classified: Secondary | ICD-10-CM

## 2018-09-03 DIAGNOSIS — R0902 Hypoxemia: Secondary | ICD-10-CM

## 2018-09-03 DIAGNOSIS — E1165 Type 2 diabetes mellitus with hyperglycemia: Secondary | ICD-10-CM | POA: Diagnosis present

## 2018-09-03 DIAGNOSIS — Z86010 Personal history of colonic polyps: Secondary | ICD-10-CM

## 2018-09-03 DIAGNOSIS — I5042 Chronic combined systolic (congestive) and diastolic (congestive) heart failure: Secondary | ICD-10-CM | POA: Diagnosis present

## 2018-09-03 DIAGNOSIS — I272 Pulmonary hypertension, unspecified: Secondary | ICD-10-CM | POA: Diagnosis not present

## 2018-09-03 DIAGNOSIS — Z7189 Other specified counseling: Secondary | ICD-10-CM | POA: Diagnosis not present

## 2018-09-03 DIAGNOSIS — D631 Anemia in chronic kidney disease: Secondary | ICD-10-CM | POA: Diagnosis present

## 2018-09-03 LAB — BASIC METABOLIC PANEL
Anion gap: 8 (ref 5–15)
BUN: 94 mg/dL — ABNORMAL HIGH (ref 8–23)
CO2: 26 mmol/L (ref 22–32)
Calcium: 8.4 mg/dL — ABNORMAL LOW (ref 8.9–10.3)
Chloride: 109 mmol/L (ref 98–111)
Creatinine, Ser: 2.52 mg/dL — ABNORMAL HIGH (ref 0.44–1.00)
GFR calc Af Amer: 21 mL/min — ABNORMAL LOW (ref >60)
GFR calc non Af Amer: 18 mL/min — ABNORMAL LOW (ref >60)
Glucose, Bld: 142 mg/dL — ABNORMAL HIGH (ref 70–99)
Potassium: 5.9 mmol/L — ABNORMAL HIGH (ref 3.5–5.1)
Sodium: 143 mmol/L (ref 135–145)

## 2018-09-03 LAB — CBC WITH DIFFERENTIAL/PLATELET
Abs Immature Granulocytes: 0.01 10*3/uL (ref 0.00–0.07)
Basophils Absolute: 0 10*3/uL (ref 0.0–0.1)
Basophils Relative: 0 %
Eosinophils Absolute: 0.6 10*3/uL — ABNORMAL HIGH (ref 0.0–0.5)
Eosinophils Relative: 8 %
HCT: 30.3 % — ABNORMAL LOW (ref 36.0–46.0)
Hemoglobin: 9.1 g/dL — ABNORMAL LOW (ref 12.0–15.0)
Immature Granulocytes: 0 %
Lymphocytes Relative: 13 %
Lymphs Abs: 1 10*3/uL (ref 0.7–4.0)
MCH: 29.5 pg (ref 26.0–34.0)
MCHC: 30 g/dL (ref 30.0–36.0)
MCV: 98.4 fL (ref 80.0–100.0)
Monocytes Absolute: 0.5 10*3/uL (ref 0.1–1.0)
Monocytes Relative: 7 %
Neutro Abs: 5.6 10*3/uL (ref 1.7–7.7)
Neutrophils Relative %: 72 %
Platelets: 205 10*3/uL (ref 150–400)
RBC: 3.08 MIL/uL — ABNORMAL LOW (ref 3.87–5.11)
RDW: 18.3 % — ABNORMAL HIGH (ref 11.5–15.5)
WBC: 7.7 10*3/uL (ref 4.0–10.5)
nRBC: 0 % (ref 0.0–0.2)

## 2018-09-03 LAB — TROPONIN I (HIGH SENSITIVITY)
Troponin I (High Sensitivity): 75 ng/L — ABNORMAL HIGH (ref <18)
Troponin I (High Sensitivity): 72 ng/L — ABNORMAL HIGH (ref <18)

## 2018-09-03 LAB — CBG MONITORING, ED: Glucose-Capillary: 98 mg/dL (ref 70–99)

## 2018-09-03 LAB — BRAIN NATRIURETIC PEPTIDE: B Natriuretic Peptide: 696 pg/mL — ABNORMAL HIGH (ref 0.0–100.0)

## 2018-09-03 LAB — GLUCOSE, CAPILLARY: Glucose-Capillary: 177 mg/dL — ABNORMAL HIGH (ref 70–99)

## 2018-09-03 LAB — SARS CORONAVIRUS 2 (TAT 6-24 HRS): SARS Coronavirus 2: NEGATIVE

## 2018-09-03 MED ORDER — VITAMIN B-6 100 MG PO TABS
100.0000 mg | ORAL_TABLET | Freq: Every day | ORAL | Status: DC
  Administered 2018-09-03 – 2018-09-12 (×9): 100 mg via ORAL
  Filled 2018-09-03 (×11): qty 1

## 2018-09-03 MED ORDER — INSULIN DETEMIR 100 UNIT/ML ~~LOC~~ SOLN
10.0000 [IU] | Freq: Every day | SUBCUTANEOUS | Status: DC
  Administered 2018-09-03 – 2018-09-10 (×8): 10 [IU] via SUBCUTANEOUS
  Filled 2018-09-03 (×10): qty 0.1

## 2018-09-03 MED ORDER — INSULIN ASPART 100 UNIT/ML ~~LOC~~ SOLN
0.0000 [IU] | Freq: Three times a day (TID) | SUBCUTANEOUS | Status: DC
  Administered 2018-09-04: 3 [IU] via SUBCUTANEOUS
  Administered 2018-09-04 – 2018-09-05 (×4): 2 [IU] via SUBCUTANEOUS
  Administered 2018-09-06 (×3): 3 [IU] via SUBCUTANEOUS
  Administered 2018-09-07: 13:00:00 2 [IU] via SUBCUTANEOUS
  Administered 2018-09-07 – 2018-09-09 (×4): 3 [IU] via SUBCUTANEOUS
  Administered 2018-09-09: 13:00:00 2 [IU] via SUBCUTANEOUS
  Administered 2018-09-10: 3 [IU] via SUBCUTANEOUS
  Administered 2018-09-10: 13:00:00 5 [IU] via SUBCUTANEOUS
  Administered 2018-09-10 – 2018-09-11 (×2): 2 [IU] via SUBCUTANEOUS
  Administered 2018-09-12: 5 [IU] via SUBCUTANEOUS
  Filled 2018-09-03: qty 0.15

## 2018-09-03 MED ORDER — ACETAMINOPHEN 650 MG RE SUPP: 650.0000 mg | Freq: Four times a day (QID) | RECTAL | Status: DC | PRN

## 2018-09-03 MED ORDER — VITAMIN E 180 MG (400 UNIT) PO CAPS
400.0000 [IU] | ORAL_CAPSULE | Freq: Every day | ORAL | Status: DC
  Administered 2018-09-03 – 2018-09-10 (×8): 400 [IU] via ORAL
  Filled 2018-09-03 (×10): qty 1

## 2018-09-03 MED ORDER — GLUCERNA SHAKE PO LIQD
237.0000 mL | Freq: Two times a day (BID) | ORAL | Status: DC
  Administered 2018-09-03 – 2018-09-05 (×3): 237 mL via ORAL
  Filled 2018-09-03 (×6): qty 237

## 2018-09-03 MED ORDER — SENNA 8.6 MG PO TABS
1.0000 | ORAL_TABLET | Freq: Two times a day (BID) | ORAL | Status: DC
  Administered 2018-09-03 – 2018-09-12 (×17): 8.6 mg via ORAL
  Filled 2018-09-03 (×17): qty 1

## 2018-09-03 MED ORDER — VITAMIN B-12 1000 MCG PO TABS
1000.0000 ug | ORAL_TABLET | Freq: Every day | ORAL | Status: DC
  Administered 2018-09-03 – 2018-09-12 (×9): 1000 ug via ORAL
  Filled 2018-09-03 (×9): qty 1

## 2018-09-03 MED ORDER — ORAL CARE MOUTH RINSE
15.0000 mL | Freq: Two times a day (BID) | OROMUCOSAL | Status: DC
  Administered 2018-09-04 – 2018-09-11 (×12): 15 mL via OROMUCOSAL

## 2018-09-03 MED ORDER — SIMVASTATIN 10 MG PO TABS
20.0000 mg | ORAL_TABLET | Freq: Every day | ORAL | Status: DC
  Administered 2018-09-03 – 2018-09-12 (×9): 20 mg via ORAL
  Filled 2018-09-03 (×2): qty 1
  Filled 2018-09-03: qty 2
  Filled 2018-09-03 (×4): qty 1
  Filled 2018-09-03: qty 2
  Filled 2018-09-03 (×2): qty 1

## 2018-09-03 MED ORDER — CYCLOBENZAPRINE HCL 10 MG PO TABS
10.0000 mg | ORAL_TABLET | Freq: Three times a day (TID) | ORAL | Status: DC | PRN
  Administered 2018-09-21: 10 mg via ORAL
  Filled 2018-09-03: qty 1

## 2018-09-03 MED ORDER — HEPARIN SODIUM (PORCINE) 5000 UNIT/ML IJ SOLN
5000.0000 [IU] | Freq: Three times a day (TID) | INTRAMUSCULAR | Status: DC
  Administered 2018-09-03 – 2018-09-21 (×55): 5000 [IU] via SUBCUTANEOUS
  Filled 2018-09-03 (×55): qty 1

## 2018-09-03 MED ORDER — INSULIN ASPART 100 UNIT/ML ~~LOC~~ SOLN
4.0000 [IU] | Freq: Three times a day (TID) | SUBCUTANEOUS | Status: DC
  Administered 2018-09-03: 4 [IU] via SUBCUTANEOUS
  Filled 2018-09-03: qty 0.04

## 2018-09-03 MED ORDER — AMLODIPINE BESYLATE 10 MG PO TABS
10.0000 mg | ORAL_TABLET | Freq: Every day | ORAL | Status: DC
  Administered 2018-09-03 – 2018-09-10 (×8): 10 mg via ORAL
  Filled 2018-09-03 (×8): qty 1

## 2018-09-03 MED ORDER — FUROSEMIDE 10 MG/ML IJ SOLN
80.0000 mg | Freq: Once | INTRAMUSCULAR | Status: AC
  Administered 2018-09-03: 80 mg via INTRAVENOUS
  Filled 2018-09-03: qty 8

## 2018-09-03 MED ORDER — MONTELUKAST SODIUM 10 MG PO TABS
10.0000 mg | ORAL_TABLET | Freq: Every day | ORAL | Status: DC
  Administered 2018-09-03 – 2018-09-10 (×8): 10 mg via ORAL
  Filled 2018-09-03 (×9): qty 1

## 2018-09-03 MED ORDER — FUROSEMIDE 10 MG/ML IJ SOLN: 80.0000 mg | Freq: Every day | INTRAMUSCULAR | Status: DC

## 2018-09-03 MED ORDER — FUROSEMIDE 10 MG/ML IJ SOLN
40.0000 mg | Freq: Two times a day (BID) | INTRAMUSCULAR | Status: DC
  Administered 2018-09-03 – 2018-09-05 (×4): 40 mg via INTRAVENOUS
  Filled 2018-09-03 (×4): qty 4

## 2018-09-03 MED ORDER — OMEGA-3-ACID ETHYL ESTERS 1 G PO CAPS
1.0000 g | ORAL_CAPSULE | Freq: Every day | ORAL | Status: DC
  Administered 2018-09-03 – 2018-09-21 (×15): 1 g via ORAL
  Filled 2018-09-03 (×19): qty 1

## 2018-09-03 MED ORDER — CHLORHEXIDINE GLUCONATE 0.12 % MT SOLN
15.0000 mL | Freq: Two times a day (BID) | OROMUCOSAL | Status: DC
  Administered 2018-09-03 – 2018-09-13 (×18): 15 mL via OROMUCOSAL
  Filled 2018-09-03 (×16): qty 15

## 2018-09-03 MED ORDER — IPRATROPIUM-ALBUTEROL 0.5-2.5 (3) MG/3ML IN SOLN
3.0000 mL | Freq: Four times a day (QID) | RESPIRATORY_TRACT | Status: DC | PRN
  Administered 2018-09-07: 13:00:00 3 mL via RESPIRATORY_TRACT
  Filled 2018-09-03: qty 3

## 2018-09-03 MED ORDER — SODIUM POLYSTYRENE SULFONATE 15 GM/60ML PO SUSP
15.0000 g | Freq: Once | ORAL | Status: AC
  Administered 2018-09-03: 15 g via ORAL
  Filled 2018-09-03: qty 60

## 2018-09-03 MED ORDER — TRAZODONE HCL 50 MG PO TABS
50.0000 mg | ORAL_TABLET | Freq: Every day | ORAL | Status: DC
  Administered 2018-09-03 – 2018-09-10 (×8): 50 mg via ORAL
  Filled 2018-09-03 (×8): qty 1

## 2018-09-03 MED ORDER — FOLIC ACID 1 MG PO TABS
0.5000 mg | ORAL_TABLET | Freq: Every day | ORAL | Status: DC
  Administered 2018-09-03 – 2018-09-21 (×17): 0.5 mg via ORAL
  Filled 2018-09-03 (×17): qty 1

## 2018-09-03 MED ORDER — PANTOPRAZOLE SODIUM 40 MG PO TBEC
40.0000 mg | DELAYED_RELEASE_TABLET | Freq: Every day | ORAL | Status: DC
  Administered 2018-09-04 – 2018-09-10 (×6): 40 mg via ORAL
  Filled 2018-09-03 (×8): qty 1

## 2018-09-03 MED ORDER — PROSIGHT PO TABS
1.0000 | ORAL_TABLET | Freq: Every day | ORAL | Status: DC
  Administered 2018-09-03 – 2018-09-21 (×17): 1 via ORAL
  Filled 2018-09-03 (×17): qty 1

## 2018-09-03 MED ORDER — TORSEMIDE 20 MG PO TABS
20.0000 mg | ORAL_TABLET | Freq: Two times a day (BID) | ORAL | Status: DC
  Administered 2018-09-03: 20 mg via ORAL
  Filled 2018-09-03 (×2): qty 1

## 2018-09-03 MED ORDER — ACETAMINOPHEN 325 MG PO TABS
650.0000 mg | ORAL_TABLET | Freq: Four times a day (QID) | ORAL | Status: DC | PRN
  Administered 2018-09-16: 09:00:00 650 mg via ORAL
  Filled 2018-09-03: qty 2

## 2018-09-03 MED ORDER — OXYCODONE-ACETAMINOPHEN 5-325 MG PO TABS
1.0000 | ORAL_TABLET | Freq: Four times a day (QID) | ORAL | Status: DC | PRN
  Administered 2018-09-05 – 2018-09-07 (×2): 1 via ORAL
  Filled 2018-09-03 (×4): qty 1

## 2018-09-03 MED ORDER — POLYSACCHARIDE IRON COMPLEX 150 MG PO CAPS
150.0000 mg | ORAL_CAPSULE | Freq: Two times a day (BID) | ORAL | Status: DC
  Administered 2018-09-03 – 2018-09-21 (×33): 150 mg via ORAL
  Filled 2018-09-03 (×38): qty 1

## 2018-09-03 MED ORDER — INSULIN ASPART 100 UNIT/ML ~~LOC~~ SOLN
0.0000 [IU] | Freq: Every day | SUBCUTANEOUS | Status: DC
  Administered 2018-09-10: 2 [IU] via SUBCUTANEOUS
  Filled 2018-09-03: qty 0.05

## 2018-09-03 MED ORDER — DOCUSATE SODIUM 100 MG PO CAPS
100.0000 mg | ORAL_CAPSULE | Freq: Two times a day (BID) | ORAL | Status: DC
  Administered 2018-09-03 – 2018-09-10 (×16): 100 mg via ORAL
  Filled 2018-09-03 (×17): qty 1

## 2018-09-03 NOTE — ED Notes (Addendum)
Report given to [place], [place]

## 2018-09-03 NOTE — H&P (Signed)
History and Physical  [name] FBP:[id number] DOB: 04/25/1940 DOA: 09/03/2018   PCP: Garwin Brothers, MD   Patient coming from: [facility]   Chief Complaint: Shortness of breath   HPI: [name] is a 78 y.o. female with medical history significant for combined CHF, DM2, Anemia who is being admitted this morning due to acute on chronic combined heartfailure. The patient is not a good historian at all, what I am able to understand is that she has been having trouble breathing. She was evaluated 8/22 at [facility] ER was evaluated and sent home. Today she was SOB again and noted to have saturations of 88% on her chronic 2 LPM home oxygen. She was treated by EMS with nitroglycerin and epinephrine.   ED Course: In the ER, CXR showed continued vascular congestion she was given 80mg  IV lasix and hospitalist called for admission.  Review of Systems: Please see HPI for pertinent positives and negatives. A complete 10 system review of systems are otherwise negative.  Past Medical History:  Diagnosis Date  . Adenomatous colon polyp   . Anemia   . Arthritis   . Asthma   . COPD (chronic obstructive pulmonary disease) ([place])   . COPD with asthma ([place]) 04/20/2011  . Depression   . Diabetes mellitus   . Diverticulosis   . Gastric ulcer   . GERD (gastroesophageal reflux disease)   . HTN (hypertension)   . Hypercholesterolemia   . Internal hemorrhoids   . Lumbar disc disease   . On home oxygen therapy    "2-3L @ bedtime" (04/04/2017)  . OSA (obstructive sleep apnea) 09/17/2012  . Peptic ulcer disease   . 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 Umbilical hernia   . Valgus foot    right    Past Surgical History:  Procedure Laterality Date  . COLONOSCOPY  2209 or 2010    2001, 2005, 2009  colonoscopy.  [place]  . COLONOSCOPY N/A 08/14/2012   Procedure: COLONOSCOPY;  Surgeon: Ladene Artist, MD;  Location: [facility] ENDOSCOPY;  Service: Endoscopy;  Laterality: N/A;  .  ESOPHAGOGASTRODUODENOSCOPY (EGD) WITH PROPOFOL N/A 07/25/2018   Procedure: ESOPHAGOGASTRODUODENOSCOPY (EGD) WITH PROPOFOL;  Surgeon: Gatha Mayer, MD;  Location: [facility];  Service: Endoscopy;  Laterality: N/A;  . INCISE AND DRAIN ABCESS     Abdominal Wall  . LIPOMA RESECTION     Right Neck  . TOTAL KNEE ARTHROPLASTY     Bilateral  . UMBILICAL HERNIA REPAIR  2007   incarcerated  . UPPER GASTROINTESTINAL ENDOSCOPY  2001   medoff Grade A esophagitis    Social History:  reports that she quit smoking about 60 years ago. Her smoking use included cigarettes. She has a 10.00 pack-year smoking history. She has never used smokeless tobacco. She reports that she does not drink alcohol or use drugs.   Allergies  Allergen Reactions  . Other Other (See Comments)    "all generics make her sick"    Family History  Problem Relation Age of Onset  . Breast cancer Maternal Aunt      Prior to Admission medications   Medication Sig Start Date End Date Taking? Authorizing Provider  acetaminophen (TYLENOL) 325 MG tablet Take 650 mg by mouth every 6 (six) hours as needed for mild pain.   Yes [provider]  Amino Acids-Protein Hydrolys (FEEDING SUPPLEMENT, PRO-STAT SUGAR FREE 64,) LIQD Take 30 mLs by mouth 2 (two) times daily. 06/20/18  Yes [place] Randall Hiss  J, DO  amLODipine (NORVASC) 10 MG tablet Take 10 mg by mouth daily.    Yes [provider]  calcitRIOL (ROCALTROL) 0.25 MCG capsule Take 1 capsule (0.25 mcg total) by mouth every Monday, Wednesday, and Friday. Patient taking differently: Take 0.25 mcg by mouth daily.  12/05/16  Yes Velvet Bathe, MD  cyclobenzaprine (FLEXERIL) 10 MG tablet Take 10 mg 3 (three) times daily as needed by mouth for muscle spasms.   Yes [provider]  feeding supplement, GLUCERNA SHAKE, (GLUCERNA SHAKE) LIQD Take 237 mLs by mouth 2 (two) times daily between meals. 08/02/18  Yes Mikhail, Velta Addison, DO  folic acid (FOLVITE) 212 MCG tablet Take  400 mcg by mouth daily.   Yes [provider]  Insulin Aspart (NOVOLOG FLEXPEN [place]) Inject 0-10 Units into the skin See admin instructions. Per Sliding Scale ar 0600, 1100, 1600, 2100 100-150 0units 151-200 2units 201-250 4units 251-300 6units 301-350 8units 351-400 10units   Yes [provider]  insulin aspart (NOVOLOG) 100 UNIT/ML injection Inject 6 Units into the skin 3 (three) times daily with meals. 06/20/18  Yes [place] Eric J, DO  insulin glargine (LANTUS) 100 UNIT/ML injection Inject 0.15 mLs (15 Units total) into the skin at bedtime. 08/02/18  Yes Mikhail, Maryann, DO  ipratropium-albuterol (DUONEB) 0.5-2.5 (3) MG/3ML SOLN Take 3 mLs by nebulization every 6 (six) hours as needed (SOB, wheezing).   Yes [provider]  iron polysaccharides (NIFEREX) 150 MG capsule Take 150 mg by mouth 2 (two) times daily.   Yes [provider]  magnesium oxide (MAG-OX) 400 MG tablet Take 400 mg by mouth daily.   Yes [provider]  montelukast (SINGULAIR) 10 MG tablet Take 1 tablet (10 mg total) by mouth at bedtime. 10/05/16  Yes Chesley Mires, MD  Multiple Vitamins-Minerals (MULTIVITAMIN ADULT PO) Take 1 tablet by mouth daily.   Yes [provider]  nitroGLYCERIN (NITROSTAT) 0.4 MG SL tablet Place 0.4 mg under the tongue every 5 (five) minutes as needed for chest pain.   Yes [provider]  Omega-3 Fatty Acids (FISH OIL) 1000 MG CAPS Take 1,000 mg by mouth daily.    Yes [provider]  oxyCODONE-acetaminophen (PERCOCET/ROXICET) 5-325 MG tablet Take 1 tablet by mouth every 6 (six) hours as needed for severe pain. 08/02/18  Yes Mikhail, Velta Addison, DO  pantoprazole (PROTONIX) 40 MG tablet Take 1 tablet (40 mg total) by mouth daily at 6 (six) AM. 08/03/18  Yes Mikhail, Velta Addison, DO  Propylene Glycol (SYSTANE BALANCE) 0.6 % SOLN Apply 1 drop to eye 2 (two) times daily.    Yes [provider]  pyridOXINE (VITAMIN B-6) 100 MG tablet Take  100 mg by mouth daily.   Yes [provider]  torsemide (DEMADEX) 10 MG tablet Take 2 tablets (20 mg total) by mouth 2 (two) times daily. 07/09/18  Yes Domenic Polite, MD  traZODone (DESYREL) 50 MG tablet Take 50 mg by mouth at bedtime.    Yes [provider]  vitamin B-12 (CYANOCOBALAMIN) 1000 MCG tablet Take 1,000 mcg by mouth daily.   Yes [provider]  Vitamin D, Ergocalciferol, (DRISDOL) 50000 UNITS CAPS capsule Take 50,000 Units by mouth 2 (two) times a week. MON and THUR 04/07/14  Yes [provider]  vitamin E 400 UNIT capsule Take 400 Units by mouth daily.   Yes [provider]  ZOCOR 20 MG tablet Take 20 mg by mouth daily. 03/16/14  Yes [provider]  multivitamin (PROSIGHT) TABS  tablet Take 1 tablet by mouth daily. Patient not taking: Reported on 09/03/2018 08/02/18   Cristal Ford, DO    Physical Exam: BP (!) 153/87   Pulse 72   Temp 98.1 F (36.7 C) (Oral)   Resp (!) 27   SpO2 99%   General:  Lethargic but awakens, oriented, calm, in no acute distress Eyes: EOMI, clear conjuctivae, white sclerea Neck: supple, no masses, trachea mildline  Cardiovascular: RRR, no murmurs or rubs, no peripheral edema  Respiratory: diminshed breath sounds, no wheezing has some rales Abdomen: soft, nontender, nondistended, normal bowel tones heard  Skin: dry, no rashes  Musculoskeletal: no joint effusions, normal range of motion  Psychiatric: appropriate affect, normal speech  Neurologic: extraocular muscles intact, clear speech, moving all extremities with intact sensorium            Labs on Admission:  Basic Metabolic Panel: Recent Labs  Lab 09/01/18 1538 09/03/18 0345  NA 142 143  K 5.5* 5.9*  CL 107 109  CO2 25 26  GLUCOSE 187* 142*  BUN 95* 94*  CREATININE 2.74* 2.52*  CALCIUM 8.4* 8.4*   Liver Function Tests: Recent Labs  Lab 09/01/18 1538  AST 19  ALT 27  ALKPHOS 58  BILITOT 0.8  PROT 7.3  ALBUMIN 3.1*    No results for input(s): LIPASE, AMYLASE in the last 168 hours. No results for input(s): AMMONIA in the last 168 hours. CBC: Recent Labs  Lab 09/01/18 1538 09/03/18 0429  WBC 8.1 7.7  NEUTROABS 6.3 5.6  HGB 8.6* 9.1*  HCT 29.3* 30.3*  MCV 99.0 98.4  PLT 216 205   Cardiac Enzymes: No results for input(s): CKTOTAL, CKMB, CKMBINDEX, TROPONINI in the last 168 hours.  BNP (last 3 results) Recent Labs    07/23/18 1459 09/01/18 1538 09/03/18 0429  BNP 466.8* 512.4* 696.0*    ProBNP (last 3 results) No results for input(s): PROBNP in the last 8760 hours.  CBG: No results for input(s): GLUCAP in the last 168 hours.  Radiological Exams on Admission: Dg Chest 2 View  Result Date: 09/01/2018 CLINICAL DATA:  Chills and cough. EXAM: CHEST - 2 VIEW COMPARISON:  08/01/2018 FINDINGS: Low volumes. The cardio pericardial silhouette is enlarged. Pulmonary vascular congestion noted with interstitial pulmonary edema pattern. No substantial pleural effusion. The visualized bony structures of the thorax are intact. Degenerative changes noted left shoulder. IMPRESSION: Cardiomegaly with interstitial pulmonary edema. Electronically Signed   By: Misty Stanley M.D.   On: 09/01/2018 16:12   Dg Chest Port 1 View  Result Date: 09/03/2018 CLINICAL DATA:  Shortness of breath and wheezing EXAM: PORTABLE CHEST 1 VIEW COMPARISON:  Two days ago FINDINGS: Cardiomegaly with perihilar vascular congestion. Lung volumes are low. No effusion, Kerley lines, or pneumothorax. IMPRESSION: Cardiomegaly and vascular congestion with possible perihilar edema. Electronically Signed   By: Monte Fantasia M.D.   On: 09/03/2018 04:14   TTE June 2020: 1. The left ventricle has hyperdynamic systolic function, with an ejection fraction of >65%. The cavity size was normal. There is mildly increased left ventricular wall thickness. Left ventricular diastolic Doppler parameters are consistent with  pseudonormalization. Elevated left  atrial and left ventricular end-diastolic pressures No evidence of left ventricular regional wall motion abnormalities.  2. The right ventricle has normal systolic function. The cavity was mildly enlarged. There is no increase in right ventricular wall thickness. Right ventricular systolic pressure is severely elevated with an estimated pressure of 79.2 mmHg.  3. Left atrial size was severely  dilated.  4. Right atrial size was moderately dilated.  5. Tricuspid valve regurgitation is mild-moderate.  6. The aortic valve is tricuspid. Severely thickening of the aortic valve. Severe calcifcation of the aortic valve. Aortic valve regurgitation is mild by color flow Doppler. Moderate stenosis of the aortic valve. AV Mean Grad: 25.0 mmHg. AV Area (VTI):  1.54 cm. AV Vmax: 365.50 cm/s  7. The inferior vena cava was dilated in size with >50% respiratory variability.  Assessment/Plan Present on Admission: . COPD with asthma ([place]) . Anemia of chronic disease . CKD (chronic kidney disease) stage 4, GFR 15-29 ml/min (HCC) . Essential hypertension . CHF (congestive heart failure), NYHA class IV, chronic, combined ([place]) . Acute on chronic combined systolic (congestive) and diastolic (congestive) heart failure (HCC)  Principal Problem:   CHF (congestive heart failure), NYHA class IV, chronic, combined ([place]) Active Problems:   COPD with asthma ([place])   Anemia of chronic disease   CKD (chronic kidney disease) stage 4, GFR 15-29 ml/min (HCC)   Type 2 diabetes mellitus (HCC)   Essential hypertension   Elevated brain natriuretic peptide (BNP) level   Troponin I above reference range   Acute on chronic combined systolic (congestive) and diastolic (congestive) heart failure (HCC)   Hyperkalemia  Acute on chronic combined CHF with exacerbation - inpatient admission - continue home cardiac meds - diurese with Lasix 80mg  IV daily - note recent Echo 06/2018, results pasted above  Elevated troponin - patient  denies any chest pain, could simply be elevated due to CHF. Monitor closely on telemetry and await 2nd and 3rd troponin values.  Hyperkalemia - likely due to worsening renal failure. No EKG changes - recheck in 12 hours, should improve with lasix  HTN - continue amlodipine  Acute on CKD stage 4 - will hold nephrotoxins as able, follow renal function daily  Type 2 DM - diabetic diet - Levemir 10 units and SSI  DVT prophylaxis: HepSQ   Code Status: FULL   Consults called: None   Admission status: inpatient   Time spent: 34 minutes   Marry Guan MD [facility] Pager [phone]  If 7PM-7AM, please contact night-coverage [email or web address] Password [email or web address]  09/03/2018, 6:18 AM

## 2018-09-03 NOTE — Progress Notes (Signed)
PROGRESS NOTE                                                                                                                                                                                                             Patient Demographics:    [name], is a 78 y.o. female, DOB - [birth year], QQP:[id number]  Admit date - 09/03/2018   Admitting Physician No admitting provider for patient encounter.  Outpatient Primary MD for the patient is Garwin Brothers, MD  LOS - 0  Outpatient Specialists:  Chief Complaint  Patient presents with  . Shortness of Breath       Brief Narrative [name] with combined systolic and diastolic CHF (recent 2D echo with normal EF), type 2 diabetes mellitus, COPD with chronic respiratory failure on nocturnal home O2, anemia, obstructive sleep apnea history of peptic ulcer disease, hypertension and hyperlipidemia presented with increased breathing for past few days.  She was evaluated at [facility], ED today prior to admission for shortness of breath and discharged back.  She was noted to be hypoxic with O2 sat of 88% on 4 L nasal cannula.  EMS noted patient had wheezing and rales and treated with epinephrine and nitroglycerin. She was found tachypneic and hypoxic with acute on chronic combined systolic and diastolic CHF with chest x-ray showing pulmonary vascular congestion.  Also noted for acute on chronic kidney disease and hyperkalemia. Given IV Lasix 80 mg x 1 and admitted to hospital service.   Subjective:   Patient is extremely poor historian and speaks very little.  Report breathing minimally improved since being in the ED.   Assessment  & Plan :    Principal Problem: Acute on chronic combined systolic and diastolic CHF ([place]). Acute on chronic respiratory failure with hypoxia (HCC) Received IV Lasix 80 mg in the ED.  Will place her on IV Lasix 40 mg every 12 hours.   Strict I's/O and daily weight. Hold home dose torsemide.  She is not on aspirin, beta-blocker or ACE inhibitor. If no clinical improvement will consult cardiology.  2D echo done during last hospitalization 5 weeks back so will not repeat.   Active Problems: Acute kidney injury on chronic kidney disease stage IV Suspect cardiorenal with acute CHF.  Mildly worsened renal function from baseline around 2.5-2.74.  Monitor with diuresis.  Hyperkalemia No EKG changes noted.  Monitor with IV diuresis.  Will order a dose of Kayexalate.  Not on potassium supplement, ACE inhibitor or Aldactone. Monitor on telemetry.  COPD, stable No signs of exacerbation.  Was on 5 L O2 via nasal cannula during my evaluation, weaned to 3.5 L and tolerating well. Resume home inhaler and nebs.  Diabetes mellitus type 2, uncontrolled with nephropathy On Lantus and pre-meal aspart at home.  Will hold off on pre-meal aspart as p.o. intake is poor at present and CBG stable.  Monitor with Lantus alone and sliding scale coverage   Elevated troponin Likely demand ischemia with CHF and CKD.  Denies chest pain symptoms.  EKG unremarkable.   Patient status: Inpatient Patient presenting with acute on chronic hypoxic respiratory failure and tachypnea in the setting of acute on chronic combined CHF, has acute on chronic kidney injury and hyperkalemia with high risk for decompensation of her CHF and renal function.  Patient needs to be monitored in an inpatient setting with higher amount of oxygen, IV diuresis and close monitoring of her electrolytes.  She needs to be monitored as inpatient for >2 midnights.    Code Status : Full code  Family Communication  : None  Disposition Plan  : Return to SNF once improved  Barriers For Discharge : Active symptoms  Consults  : None  Procedures  : None  DVT Prophylaxis  :  Lovenox -   Lab Results  Component Value Date   PLT 205 09/03/2018    Antibiotics  :     Anti-infectives (From admission, onward)   None        Objective:   Vitals:   09/03/18 1200 09/03/18 1230 09/03/18 1300 09/03/18 1330  BP: (!) 141/52 134/64 (!) 132/59 130/69  Pulse: 66 74 65 61  Resp: (!) 27 (!) 30 (!) 26 (!) 25  Temp:      TempSrc:      SpO2: 96% 98% 92% 96%    Wt Readings from Last 3 Encounters:  08/02/18 81.6 kg  07/10/18 72 kg  06/20/18 83.9 kg     Intake/Output Summary (Last 24 hours) at 09/03/2018 1425 Last data filed at 09/03/2018 1127 Gross per 24 hour  Intake -  Output 800 ml  Net -800 ml     Physical Exam  Gen: Elderly female fatigued, not in distress HEENT: no pallor, moist mucosa, supple neck Chest: Fine bibasilar crackles CVS: N S1&S2, no murmurs, rubs or gallop GI: soft, NT, ND, BS+ Musculoskeletal: warm, trace leg edema bilaterally     Data Review:    CBC Recent Labs  Lab 09/01/18 1538 09/03/18 0429  WBC 8.1 7.7  HGB 8.6* 9.1*  HCT 29.3* 30.3*  PLT 216 205  MCV 99.0 98.4  MCH 29.1 29.5  MCHC 29.4* 30.0  RDW 18.3* 18.3*  LYMPHSABS 0.7 1.0  MONOABS 0.5 0.5  EOSABS 0.5 0.6*  BASOSABS 0.0 0.0    Chemistries  Recent Labs  Lab 09/01/18 1538 09/03/18 0345  NA 142 143  K 5.5* 5.9*  CL 107 109  CO2 25 26  GLUCOSE 187* 142*  BUN 95* 94*  CREATININE 2.74* 2.52*  CALCIUM 8.4* 8.4*  AST 19  --   ALT 27  --   ALKPHOS 58  --   BILITOT 0.8  --    ------------------------------------------------------------------------------------------------------------------ No results for input(s): CHOL, HDL, LDLCALC, TRIG, CHOLHDL, LDLDIRECT in the last 72 hours.  Lab Results  Component Value Date  HGBA1C 9.9 (H) 06/16/2018   ------------------------------------------------------------------------------------------------------------------ No results for input(s): TSH, T4TOTAL, T3FREE, THYROIDAB in the last 72 hours.  Invalid input(s): FREET3  ------------------------------------------------------------------------------------------------------------------ No results for input(s): VITAMINB12, FOLATE, FERRITIN, TIBC, IRON, RETICCTPCT in the last 72 hours.  Coagulation profile No results for input(s): INR, PROTIME in the last 168 hours.  No results for input(s): DDIMER in the last 72 hours.  Cardiac Enzymes No results for input(s): CKMB, TROPONINI, MYOGLOBIN in the last 168 hours.  Invalid input(s): CK ------------------------------------------------------------------------------------------------------------------    Component Value Date/Time   BNP 696.0 (H) 09/03/2018 0429    Inpatient Medications  Scheduled Meds: . amLODipine  10 mg Oral Daily  . docusate sodium  100 mg Oral BID  . feeding supplement (GLUCERNA SHAKE)  237 mL Oral BID BM  . folic acid  0.5 mg Oral Daily  . [START ON 09/04/2018] furosemide  80 mg Intravenous Daily  . heparin  5,000 Units Subcutaneous Q8H  . insulin aspart  0-15 Units Subcutaneous TID WC  . insulin aspart  0-5 Units Subcutaneous QHS  . insulin aspart  4 Units Subcutaneous TID WC  . insulin detemir  10 Units Subcutaneous QHS  . iron polysaccharides  150 mg Oral BID  . montelukast  10 mg Oral QHS  . multivitamin  1 tablet Oral Daily  . omega-3 acid ethyl esters  1 g Oral Daily  . pantoprazole  40 mg Oral Q0600  . pyridOXINE  100 mg Oral Daily  . senna  1 tablet Oral BID  . simvastatin  20 mg Oral Daily  . torsemide  20 mg Oral BID  . traZODone  50 mg Oral QHS  . vitamin B-12  1,000 mcg Oral Daily  . vitamin E  400 Units Oral Daily   Continuous Infusions: PRN Meds:.acetaminophen **OR** acetaminophen, cyclobenzaprine, ipratropium-albuterol, oxyCODONE-acetaminophen  Micro Results Recent Results (from the past 240 hour(s))  Blood culture (routine x 2)     Status: None (Preliminary result)   Collection Time: 09/01/18  3:59 PM   Specimen: BLOOD RIGHT HAND  Result Value Ref Range  Status   Specimen Description BLOOD RIGHT HAND  Final   Special Requests   Final    BOTTLES DRAWN AEROBIC AND ANAEROBIC Blood Culture results may not be optimal due to an inadequate volume of blood received in culture bottles   Culture   Final    NO GROWTH 2 DAYS Performed at [facility], [place] [address] [place], [place] [address]    Report Status PENDING  Incomplete  Blood culture (routine x 2)     Status: None (Preliminary result)   Collection Time: 09/01/18  4:06 PM   Specimen: BLOOD LEFT FOREARM  Result Value Ref Range Status   Specimen Description BLOOD LEFT FOREARM  Final   Special Requests   Final    BOTTLES DRAWN AEROBIC AND ANAEROBIC Blood Culture results may not be optimal due to an inadequate volume of blood received in culture bottles   Culture   Final    NO GROWTH 2 DAYS Performed at [facility], [place] [address], [place], [place] [address]    Report Status PENDING  Incomplete  SARS CORONAVIRUS 2 Nasal Swab Aptima Multi Swab     Status: None   Collection Time: 09/03/18  6:33 AM   Specimen: Aptima Multi Swab; Nasal Swab  Result Value Ref Range Status   SARS Coronavirus 2 NEGATIVE NEGATIVE Final    Comment: (NOTE) SARS-CoV-2 target nucleic acids are  NOT DETECTED. The SARS-CoV-2 RNA is generally detectable in upper and lower respiratory specimens during the acute phase of infection. Negative results do not preclude SARS-CoV-2 infection, do not rule out co-infections with other pathogens, and should not be used as the sole basis for treatment or other patient management decisions. Negative results must be combined with clinical observations, patient history, and epidemiological information. The expected result is Negative. Fact Sheet for Patients: SugarRoll.be Fact Sheet for Healthcare Providers: https://www.woods-mathews.com/ This test is not yet approved or cleared by the [place] FDA and  has been authorized  for detection and/or diagnosis of SARS-CoV-2 by FDA under an Emergency Use Authorization (EUA). This EUA will remain  in effect (meaning this test can be used) for the duration of the COVID-19 declaration under Section 56 4(b)(1) of the Act, 21 U.S.C. section 360bbb-3(b)(1), unless the authorization is terminated or revoked sooner. Performed at [facility], [place] [address] [place], [place] [address]     Radiology Reports Dg Chest 2 View  Result Date: 09/01/2018 CLINICAL DATA:  Chills and cough. EXAM: CHEST - 2 VIEW COMPARISON:  08/01/2018 FINDINGS: Low volumes. The cardio pericardial silhouette is enlarged. Pulmonary vascular congestion noted with interstitial pulmonary edema pattern. No substantial pleural effusion. The visualized bony structures of the thorax are intact. Degenerative changes noted left shoulder. IMPRESSION: Cardiomegaly with interstitial pulmonary edema. Electronically Signed   By: Misty Stanley M.D.   On: 09/01/2018 16:12   Dg Chest Port 1 View  Result Date: 09/03/2018 CLINICAL DATA:  Shortness of breath and wheezing EXAM: PORTABLE CHEST 1 VIEW COMPARISON:  Two days ago FINDINGS: Cardiomegaly with perihilar vascular congestion. Lung volumes are low. No effusion, Kerley lines, or pneumothorax. IMPRESSION: Cardiomegaly and vascular congestion with possible perihilar edema. Electronically Signed   By: Monte Fantasia M.D.   On: 09/03/2018 04:14    Time Spent in minutes  35   Kwadwo Taras M.D on 09/03/2018 at 2:25 PM  Between 7am to 7pm - Pager - [phone]  After 7pm go to [email or web address] - password [facility]  [facility] -  Office  [phone]

## 2018-09-03 NOTE — ED Triage Notes (Signed)
Pt comes to ed from [facility]. Pt was at [facility] yesterday for pulmonary edema, today pt had a drop in oxygen stat from her home 2 liters 02. Pt desat 88 at facility on 4 liters, so ems called at arrive 1:45am. Pt has rales and exp wheeze 0.4 sublingual nitro, and 15 liters given by ems. On reassessment rales still present so  0.3 epi IM left deltoid, and a 2nd  0.4 sublingual nitro given. Pt denies chest pain.  IV 20 in lac.  V/s 146/68, hr 88, cbg 152, spo2 15 liters 100 percent, temp 98  Dry cough present.  Bed bound per ems.  Hx of DM and chronic kidney diease.

## 2018-09-03 NOTE — ED Provider Notes (Signed)
[facility] DEPT Provider Note   CSN: [id number] Arrival date & time: 09/03/18  0241    History   Chief Complaint Chief Complaint  Patient presents with   Shortness of Breath    HPI [name] is a 78 y.o. female.   The history is provided by the EMS personnel and the patient.  Shortness of Breath She has history of hypertension, diabetes, hyperlipidemia, COPD, congestive heart failure and comes in because of difficulty breathing and hypoxia.  She had been in the emergency department yesterday with difficulty breathing and return to her skilled nursing facility where she was noted to become hypoxic with oxygen saturation 88% on 4 L oxygen (she is normally on 2 L).  EMS was called and noted rales and wheezes and treated her with nitroglycerin and epinephrine.  Patient states that her breathing is better now than it was at their her skilled nursing facility.  She denies any chest pain, heaviness, tightness, pressure.  She denies fever or chills.  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/2011   Depression    Diabetes mellitus    Diverticulosis    Gastric ulcer    GERD (gastroesophageal reflux disease)    HTN (hypertension)    Hypercholesterolemia    Internal hemorrhoids    Lumbar disc disease    On home oxygen therapy    "2-3L @ bedtime" (04/04/2017)   OSA (obstructive sleep apnea) 09/17/2012   Peptic ulcer disease    Renal oncocytoma 2011   ablated by [place]   Umbilical hernia    Valgus foot    right     Patient Active Problem List   Diagnosis Date Noted   Pressure injury of skin 07/24/2018   Aortic atherosclerosis ([place]) 07/24/2018   Occult blood positive stool    Anemia due to GI blood loss 07/23/2018   Essential hypertension 07/05/2018   CHF (congestive heart  failure), NYHA class IV, chronic, combined ([place]) 07/05/2018   Diabetic hyperosmolar non-ketotic state ([place]) 06/17/2018   Hyperglycemia 06/16/2018   Type 2 diabetes mellitus ([place]) 06/16/2018   UTI (urinary tract infection) 06/16/2018   Sacral decubitus ulcer 06/16/2018   Hypertensive urgency 04/04/2017   CKD (chronic kidney disease) stage 4, GFR 15-29 ml/min ([facility]) 04/04/2017   Hypoglycemia    Metabolic acidosis with normal anion gap and bicarbonate losses 11/23/2016   Midfoot ulcer, left, limited to breakdown of skin ([place]) 06/13/2016   Idiopathic chronic venous hypertension of both lower extremities with inflammation 03/10/2016   Onychomycosis 03/10/2016   Chronic obstructive pulmonary disease ([place]) 11/07/2013   Upper airway cough syndrome 11/07/2013   Insomnia 12/10/2012   OSA (obstructive sleep apnea) 09/17/2012   Benign neoplasm of colon 08/14/2012   Rectal bleeding 08/13/2012    Class: Acute   Anemia of chronic disease 08/13/2012    Class: Acute   Osteoarthritis 08/13/2012    Class: Chronic   Acute posthemorrhagic anemia 08/13/2012   Diverticulosis of colon (without mention of hemorrhage) 08/13/2012   Hoarseness 07/20/2011   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  [place]   COLONOSCOPY N/A 08/14/2012   Procedure: COLONOSCOPY;  Surgeon: Ladene Artist, MD;  Location: [facility] ENDOSCOPY;  Service: Endoscopy;  Laterality: N/A;   ESOPHAGOGASTRODUODENOSCOPY (EGD) WITH PROPOFOL N/A 07/25/2018   Procedure: ESOPHAGOGASTRODUODENOSCOPY (EGD) WITH PROPOFOL;  Surgeon: Gatha Mayer, MD;  Location: [facility];  Service: Endoscopy;  Laterality: N/A;   INCISE AND DRAIN ABCESS      Abdominal Wall   LIPOMA RESECTION     Right Neck   TOTAL KNEE ARTHROPLASTY     Bilateral   UMBILICAL HERNIA REPAIR  2007   incarcerated   UPPER GASTROINTESTINAL ENDOSCOPY  2001   medoff Grade A esophagitis     OB History   No obstetric history on file.      Home Medications    Prior to Admission medications   Medication Sig Start Date End Date Taking? Authorizing Provider  acetaminophen (TYLENOL) 325 MG tablet Take 650 mg by mouth every 6 (six) hours as needed for mild pain.    [provider]  Amino Acids-Protein Hydrolys (FEEDING SUPPLEMENT, PRO-STAT SUGAR FREE 64,) LIQD Take 30 mLs by mouth 2 (two) times daily. 06/20/18   [place] Eric J, DO  amLODipine (NORVASC) 10 MG tablet Take 10 mg by mouth daily.     [provider]  calcitRIOL (ROCALTROL) 0.25 MCG capsule Take 1 capsule (0.25 mcg total) by mouth every Monday, Wednesday, and Friday. 12/05/16   Velvet Bathe, MD  Calcium Carbonate-Vitamin D (CALTRATE 600+D) 600-400 MG-UNIT tablet Take 1 tablet by mouth daily.     [provider]  collagenase (SANTYL) ointment Apply 1 application topically daily.    [provider]  cyclobenzaprine (FLEXERIL) 10 MG tablet Take 10 mg 3 (three) times daily as needed by mouth for muscle spasms.    [provider]  feeding supplement, GLUCERNA SHAKE, (GLUCERNA SHAKE) LIQD Take 237 mLs by mouth 2 (two) times daily between meals. 08/02/18   Mikhail, Velta Addison, DO  folic acid (FOLVITE) 974 MCG tablet Take 400 mcg by mouth daily.    [provider]  Insulin Aspart (NOVOLOG FLEXPEN [place]) Inject 0-10 Units into the skin See admin instructions. Per Sliding Scale ar 0600, 1100, 1600, 2100 100-150 0units 151-200 2units 201-250 4units 251-300 6units 301-350 8units 351-400 10units    [provider]  insulin aspart (NOVOLOG) 100 UNIT/ML injection Inject 6 Units into the skin 3 (three) times daily with meals. 06/20/18   [place] Eric J, DO    insulin glargine (LANTUS) 100 UNIT/ML injection Inject 0.15 mLs (15 Units total) into the skin at bedtime. 08/02/18   Cristal Ford, DO  iron polysaccharides (NIFEREX) 150 MG capsule Take 150 mg by mouth 2 (two) times daily.    [provider]  magnesium oxide (MAG-OX) 400 MG tablet Take 400 mg by mouth daily.    [provider]  montelukast (SINGULAIR) 10 MG tablet Take 1 tablet (10 mg total) by mouth at bedtime. 10/05/16   Chesley Mires, MD  multivitamin (PROSIGHT) TABS tablet Take 1 tablet by mouth daily. 08/02/18   Mikhail, Velta Addison, DO  nitroGLYCERIN (NITROSTAT) 0.4 MG SL tablet Place 0.4 mg under the tongue every 5 (five) minutes as needed for chest pain.    [provider]  Omega-3 Fatty Acids (FISH OIL) 1000 MG CAPS Take 1,000 mg by mouth daily.     [provider]  oxyCODONE-acetaminophen (PERCOCET/ROXICET) 5-325 MG tablet Take 1  tablet by mouth every 6 (six) hours as needed for severe pain. 08/02/18   Mikhail, Velta Addison, DO  pantoprazole (PROTONIX) 40 MG tablet Take 1 tablet (40 mg total) by mouth daily at 6 (six) AM. 08/03/18   Mikhail, Velta Addison, DO  Propylene Glycol (SYSTANE BALANCE) 0.6 % SOLN Apply 1 drop to eye 2 (two) times daily as needed (dry eyes).     [provider]  pyridOXINE (VITAMIN B-6) 100 MG tablet Take 100 mg by mouth daily.    [provider]  torsemide (DEMADEX) 10 MG tablet Take 2 tablets (20 mg total) by mouth 2 (two) times daily. 07/09/18   Domenic Polite, MD  traZODone (DESYREL) 50 MG tablet Take 50 mg at bedtime as needed by mouth for sleep.    [provider]  vitamin B-12 (CYANOCOBALAMIN) 1000 MCG tablet Take 1,000 mcg by mouth daily.    [provider]  Vitamin D, Ergocalciferol, (DRISDOL) 50000 UNITS CAPS capsule Take 50,000 Units by mouth 2 (two) times a week. MON and THUR 04/07/14   [provider]  vitamin E 400 UNIT capsule Take 400 Units by mouth daily.    [provider]   ZOCOR 20 MG tablet Take 20 mg by mouth daily. 03/16/14   [provider]    Family History Family History  Problem Relation Age of Onset   Breast cancer Maternal Aunt     Social History Social History   Tobacco Use   Smoking status: Former Smoker    Packs/day: 2.00    Years: 5.00    Pack years: 10.00    Types: Cigarettes    Quit date: 01/10/1958    Years since quitting: 60.6   Smokeless tobacco: Never Used  Substance Use Topics   Alcohol use: No    Comment: Quit when she quit smoking   Drug use: No     Allergies   Other   Review of Systems Review of Systems  Respiratory: Positive for shortness of breath.   All other systems reviewed and are negative.    Physical Exam Updated Vital Signs BP (!) 153/77    Pulse 77    Temp 98.1 F (36.7 C) (Oral)    Resp 20    SpO2 97%   Physical Exam Vitals signs and nursing note reviewed.    78 year old female, resting comfortably and in no acute distress. Vital signs are significant for elevated blood pressure. Oxygen saturation is 97%, which is normal. Head is normocephalic and atraumatic. PERRLA, EOMI. Oropharynx is clear. Neck is nontender and supple without adenopathy.  JVD is present. Back is nontender and there is no CVA tenderness. Lungs have rales halfway up without wheezes or rhonchi. Chest is nontender. Heart has regular rate and rhythm with 2/6 systolic ejection murmur best heard along the left sternal border. Abdomen is soft, flat, nontender without masses or hepatosplenomegaly and peristalsis is normoactive. Extremities have trace edema, full range of motion is present. Skin is warm and dry without rash. Neurologic: Mental status is normal, cranial nerves are intact, there are no motor or sensory deficits.  ED Treatments / Results  Labs (all labs ordered are listed, but only abnormal results are displayed) Labs Reviewed  BASIC METABOLIC PANEL - Abnormal; Notable for the following components:       Result Value   Potassium 5.9 (*)    Glucose, Bld 142 (*)    BUN 94 (*)    Creatinine, Ser 2.52 (*)    Calcium  8.4 (*)    GFR calc non Af Amer 18 (*)    GFR calc Af Amer 21 (*)    All other components within normal limits  BRAIN NATRIURETIC PEPTIDE - Abnormal; Notable for the following components:   B Natriuretic Peptide 696.0 (*)    All other components within normal limits  CBC WITH DIFFERENTIAL/PLATELET - Abnormal; Notable for the following components:   RBC 3.08 (*)    Hemoglobin 9.1 (*)    HCT 30.3 (*)    RDW 18.3 (*)    Eosinophils Absolute 0.6 (*)    All other components within normal limits  TROPONIN I (HIGH SENSITIVITY) - Abnormal; Notable for the following components:   Troponin I (High Sensitivity) 75 (*)    All other components within normal limits  CBC WITH DIFFERENTIAL/PLATELET  TROPONIN I (HIGH SENSITIVITY)    EKG EKG Interpretation  Date/Time:  Monday September 03 2018 05:18:35 EDT Ventricular Rate:  70 PR Interval:    QRS Duration: 95 QT Interval:  427 QTC Calculation: 461 R Axis:   24 Text Interpretation:  Sinus rhythm Left ventricular hypertrophy Abnormal T, consider ischemia, anterior leads When compared with ECG of 09/01/2018, T wave abnormality is more pronounced Confirmed by Delora Fuel ([id number]) on 09/03/2018 5:34:49 AM   Radiology Dg Chest 2 View  Result Date: 09/01/2018 CLINICAL DATA:  Chills and cough. EXAM: CHEST - 2 VIEW COMPARISON:  08/01/2018 FINDINGS: Low volumes. The cardio pericardial silhouette is enlarged. Pulmonary vascular congestion noted with interstitial pulmonary edema pattern. No substantial pleural effusion. The visualized bony structures of the thorax are intact. Degenerative changes noted left shoulder. IMPRESSION: Cardiomegaly with interstitial pulmonary edema. Electronically Signed   By: Misty Stanley M.D.   On: 09/01/2018 16:12    Procedures Procedures   Medications Ordered in ED Medications  furosemide (LASIX) injection 80  mg (80 mg Intravenous Given 09/03/18 0425)     Initial Impression / Assessment and Plan / ED Course  I have reviewed the triage vital signs and the nursing notes.  Pertinent labs & imaging results that were available during my care of the patient were reviewed by me and considered in my medical decision making (see chart for details).  Worsening dyspnea.  Old records are reviewed confirming ED visit on August 22 with x-ray findings of pulmonary edema, patient refusing hospital admission at that time.  BNP was elevated at 512.  Will repeat chest x-ray and labs.  Since she was known to pulmonary edema yesterday and has findings consistent with heart failure today, will also give dose of intravenous furosemide.  Chest x-ray is consistent with heart failure.  Labs show renal insufficiency with creatinine 2.52, approximately at her baseline.  BNP is elevated at 696.  Moderate anemia is present unchanged from baseline.  Hyperkalemia is noted but ECG shows no changes of hyperkalemia.  With adequate diuresis, potassium should come down.  No indication for emergent lowering of potassium.  High-sensitivity troponin is mildly elevated with repeat troponin pending.  Case is discussed with Dr. Renaee Munda of [facility] hospital[facility], who agrees to admit the patient.  Final Clinical Impressions(s) / ED Diagnoses   Final diagnoses:  Acute on chronic heart failure, unspecified heart failure type ([place])  Renal insufficiency  Hyperkalemia  Normochromic normocytic anemia  Elevated troponin    ED Discharge Orders    None       Delora Fuel, MD 46/27/03 [phone]

## 2018-09-04 ENCOUNTER — Inpatient Hospital Stay (HOSPITAL_COMMUNITY): Payer: Medicare Other

## 2018-09-04 DIAGNOSIS — M7989 Other specified soft tissue disorders: Secondary | ICD-10-CM

## 2018-09-04 DIAGNOSIS — D649 Anemia, unspecified: Secondary | ICD-10-CM

## 2018-09-04 LAB — GLUCOSE, CAPILLARY
Glucose-Capillary: 128 mg/dL — ABNORMAL HIGH (ref 70–99)
Glucose-Capillary: 181 mg/dL — ABNORMAL HIGH (ref 70–99)
Glucose-Capillary: 143 mg/dL — ABNORMAL HIGH (ref 70–99)

## 2018-09-04 LAB — BASIC METABOLIC PANEL
Anion gap: 7 (ref 5–15)
BUN: 92 mg/dL — ABNORMAL HIGH (ref 8–23)
CO2: 29 mmol/L (ref 22–32)
Calcium: 8.6 mg/dL — ABNORMAL LOW (ref 8.9–10.3)
Chloride: 106 mmol/L (ref 98–111)
Creatinine, Ser: 2.33 mg/dL — ABNORMAL HIGH (ref 0.44–1.00)
GFR calc Af Amer: 23 mL/min — ABNORMAL LOW (ref >60)
GFR calc non Af Amer: 20 mL/min — ABNORMAL LOW (ref >60)
Glucose, Bld: 143 mg/dL — ABNORMAL HIGH (ref 70–99)
Potassium: 4.8 mmol/L (ref 3.5–5.1)
Sodium: 142 mmol/L (ref 135–145)

## 2018-09-04 LAB — CBC
HCT: 29.7 % — ABNORMAL LOW (ref 36.0–46.0)
Hemoglobin: 8.7 g/dL — ABNORMAL LOW (ref 12.0–15.0)
MCH: 29.1 pg (ref 26.0–34.0)
MCHC: 29.3 g/dL — ABNORMAL LOW (ref 30.0–36.0)
MCV: 99.3 fL (ref 80.0–100.0)
Platelets: 208 10*3/uL (ref 150–400)
RBC: 2.99 MIL/uL — ABNORMAL LOW (ref 3.87–5.11)
RDW: 17.8 % — ABNORMAL HIGH (ref 11.5–15.5)
WBC: 7.6 10*3/uL (ref 4.0–10.5)
nRBC: 0 % (ref 0.0–0.2)

## 2018-09-04 LAB — MRSA PCR SCREENING: MRSA by PCR: POSITIVE — AB

## 2018-09-04 MED ORDER — CHLORHEXIDINE GLUCONATE CLOTH 2 % EX PADS
6.0000 | MEDICATED_PAD | Freq: Every day | CUTANEOUS | Status: AC
  Administered 2018-09-04 – 2018-09-08 (×4): 6 via TOPICAL

## 2018-09-04 MED ORDER — MUPIROCIN 2 % EX OINT
1.0000 "application " | TOPICAL_OINTMENT | Freq: Two times a day (BID) | CUTANEOUS | Status: AC
  Administered 2018-09-04 – 2018-09-08 (×10): 1 via NASAL
  Filled 2018-09-04: qty 22

## 2018-09-04 NOTE — Plan of Care (Signed)
°  Problem: Education: °Goal: Ability to demonstrate management of disease process will improve °Outcome: Progressing °Goal: Ability to verbalize understanding of medication therapies will improve °Outcome: Progressing °  °Problem: Activity: °Goal: Capacity to carry out activities will improve °Outcome: Progressing °  °Problem: Cardiac: °Goal: Ability to achieve and maintain adequate cardiopulmonary perfusion will improve °Outcome: Progressing °  °Problem: Education: °Goal: Knowledge of General Education information will improve °Description: Including pain rating scale, medication(s)/side effects and non-pharmacologic comfort measures °Outcome: Progressing °  °Problem: Health Behavior/Discharge Planning: °Goal: Ability to manage health-related needs will improve °Outcome: Progressing °  °Problem: Clinical Measurements: °Goal: Ability to maintain clinical measurements within normal limits will improve °Outcome: Progressing °Goal: Will remain free from infection °Outcome: Progressing °Goal: Diagnostic test results will improve °Outcome: Progressing °Goal: Respiratory complications will improve °Outcome: Progressing °Goal: Cardiovascular complication will be avoided °Outcome: Progressing °  °Problem: Activity: °Goal: Risk for activity intolerance will decrease °Outcome: Progressing °  °Problem: Nutrition: °Goal: Adequate nutrition will be maintained °Outcome: Progressing °  °Problem: Coping: °Goal: Level of anxiety will decrease °Outcome: Progressing °  °Problem: Elimination: °Goal: Will not experience complications related to urinary retention °Outcome: Progressing °  °Problem: Pain Managment: °Goal: General experience of comfort will improve °Outcome: Progressing °  °Problem: Safety: °Goal: Ability to remain free from injury will improve °Outcome: Progressing °  °Problem: Skin Integrity: °Goal: Risk for impaired skin integrity will decrease °Outcome: Progressing °  °

## 2018-09-04 NOTE — Progress Notes (Signed)
PROGRESS NOTE                                                                                                                                                                                                             Patient Demographics:    [name], is a 78 y.o. female, DOB - [birth year], TGY:[id number]  Admit date - 09/03/2018   Admitting Physician Mir Marry Guan, MD  Outpatient Primary MD for the patient is Garwin Brothers, MD  LOS - 1  Outpatient Specialists:  Chief Complaint  Patient presents with  . Shortness of Breath       Brief Narrative [name] with combined systolic and diastolic CHF (recent 2D echo with normal EF), type 2 diabetes mellitus, COPD with chronic respiratory failure on nocturnal home O2, anemia, obstructive sleep apnea history of peptic ulcer disease, hypertension and hyperlipidemia presented with increased breathing for past few days.  She was evaluated at [facility], ED today prior to admission for shortness of breath and discharged back.  She was noted to be hypoxic with O2 sat of 88% on 4 L nasal cannula.  EMS noted patient had wheezing and rales and treated with epinephrine and nitroglycerin. She was found tachypneic and hypoxic with acute on chronic combined systolic and diastolic CHF with chest x-ray showing pulmonary vascular congestion.  Also noted for acute on chronic kidney disease and hyperkalemia. Given IV Lasix 80 mg x 1 and admitted to hospital service.   Subjective:   Appears more conversant today.  Report breathing slightly better since yesterday.  Has diuresed well on IV Lasix.  Assessment  & Plan :    Principal Problem: Acute on chronic combined systolic and diastolic CHF ([place]). Acute on chronic respiratory failure with hypoxia (HCC) Continue IV Lasix 40 mg every 12 hours, has good response with diuresis.  Strict I's/O and daily weight. On  torsemide at home which is held..  She is not on aspirin, beta-blocker or ACE inhibitor. If no clinical improvement will consult cardiology.  2D echo done during last hospitalization 5 weeks back so will not repeat.   Active Problems: Acute kidney injury on chronic kidney disease stage IV Suspect cardiorenal with acute CHF.  Baseline creatinine around 2.5.  Resolved with diuresis.  Hyperkalemia No EKG changes noted.  Not on any meds at  home to contribute.  Resolved with diuresis.  COPD, stable No signs of exacerbation.  Stable on 2 L (patient is on 2 L chronic O2 at night).  Continue home inhalers   Diabetes mellitus type 2, uncontrolled with nephropathy On Lantus and pre-meal aspart at home.  Pre-meal aspart held due to poor p.o. intake.  Being monitored on Lantus and sliding scale insulin alone.   Elevated troponin Likely demand ischemia with CHF and CKD.  Denies chest pain symptoms.  EKG unremarkable.  Bilateral heel pressure injuries (stage III on right). Seen by wound care and appreciate recommendation.            Code Status : Full code  Family Communication  : None  Disposition Plan  : Return to SNF once improved  Barriers For Discharge : Active symptoms  Consults  : Wound care  Procedures  : None  DVT Prophylaxis  :  Lovenox -   Lab Results  Component Value Date   PLT 208 09/04/2018    Antibiotics  :    Anti-infectives (From admission, onward)   None        Objective:   Vitals:   09/03/18 1445 09/03/18 1500 09/03/18 2106 09/04/18 0553  BP:  (!) 160/79 (!) 158/80 (!) 145/79  Pulse:   72 78  Resp:  (!) 28 20 20   Temp:   98.2 F (36.8 C) 97.8 F (36.6 C)  TempSrc:   Oral Oral  SpO2:   90% 90%  Weight: 85.2 kg       Wt Readings from Last 3 Encounters:  09/03/18 85.2 kg  08/02/18 81.6 kg  07/10/18 72 kg     Intake/Output Summary (Last 24 hours) at 09/04/2018 0738 Last data filed at 09/04/2018 0526 Gross per 24 hour  Intake 200 ml   Output 1800 ml  Net -1600 ml    Physical exam Elderly female not in distress HEENT: Pallor present, moist mucosa, supple neck Chest: Bibasilar crackles (mild improvement from yesterday) CVs: Normal S1-S2, no murmur GI: Soft, nondistended, nontender Musculoskeletal: Warm, trace bilateral leg edema, chronic right heel ulcer (stage III), no signs of infection.  aVF stable black eschar (dry) on left foot plantar area. Healed superficial pressure ulcer over the left hip and sacrum.     Data Review:    CBC Recent Labs  Lab 09/01/18 1538 09/03/18 0429 09/04/18 0449  WBC 8.1 7.7 7.6  HGB 8.6* 9.1* 8.7*  HCT 29.3* 30.3* 29.7*  PLT 216 205 208  MCV 99.0 98.4 99.3  MCH 29.1 29.5 29.1  MCHC 29.4* 30.0 29.3*  RDW 18.3* 18.3* 17.8*  LYMPHSABS 0.7 1.0  --   MONOABS 0.5 0.5  --   EOSABS 0.5 0.6*  --   BASOSABS 0.0 0.0  --     Chemistries  Recent Labs  Lab 09/01/18 1538 09/03/18 0345 09/04/18 0449  NA 142 143 142  K 5.5* 5.9* 4.8  CL 107 109 106  CO2 25 26 29   GLUCOSE 187* 142* 143*  BUN 95* 94* 92*  CREATININE 2.74* 2.52* 2.33*  CALCIUM 8.4* 8.4* 8.6*  AST 19  --   --   ALT 27  --   --   ALKPHOS 58  --   --   BILITOT 0.8  --   --    ------------------------------------------------------------------------------------------------------------------ No results for input(s): CHOL, HDL, LDLCALC, TRIG, CHOLHDL, LDLDIRECT in the last 72 hours.  Lab Results  Component Value Date   HGBA1C 9.9 (H) 06/16/2018   ------------------------------------------------------------------------------------------------------------------  No results for input(s): TSH, T4TOTAL, T3FREE, THYROIDAB in the last 72 hours.  Invalid input(s): FREET3 ------------------------------------------------------------------------------------------------------------------ No results for input(s): VITAMINB12, FOLATE, FERRITIN, TIBC, IRON, RETICCTPCT in the last 72 hours.  Coagulation profile No results  for input(s): INR, PROTIME in the last 168 hours.  No results for input(s): DDIMER in the last 72 hours.  Cardiac Enzymes No results for input(s): CKMB, TROPONINI, MYOGLOBIN in the last 168 hours.  Invalid input(s): CK ------------------------------------------------------------------------------------------------------------------    Component Value Date/Time   BNP 696.0 (H) 09/03/2018 0429    Inpatient Medications  Scheduled Meds: . amLODipine  10 mg Oral Daily  . chlorhexidine  15 mL Mouth Rinse BID  . Chlorhexidine Gluconate Cloth  6 each Topical Q0600  . docusate sodium  100 mg Oral BID  . feeding supplement (GLUCERNA SHAKE)  237 mL Oral BID BM  . folic acid  0.5 mg Oral Daily  . furosemide  40 mg Intravenous Q12H  . heparin  5,000 Units Subcutaneous Q8H  . insulin aspart  0-15 Units Subcutaneous TID WC  . insulin aspart  0-5 Units Subcutaneous QHS  . insulin detemir  10 Units Subcutaneous QHS  . iron polysaccharides  150 mg Oral BID  . mouth rinse  15 mL Mouth Rinse q12n4p  . montelukast  10 mg Oral QHS  . multivitamin  1 tablet Oral Daily  . mupirocin ointment  1 application Nasal BID  . omega-3 acid ethyl esters  1 g Oral Daily  . pantoprazole  40 mg Oral Q0600  . pyridOXINE  100 mg Oral Daily  . senna  1 tablet Oral BID  . simvastatin  20 mg Oral Daily  . traZODone  50 mg Oral QHS  . vitamin B-12  1,000 mcg Oral Daily  . vitamin E  400 Units Oral Daily   Continuous Infusions: PRN Meds:.acetaminophen **OR** acetaminophen, cyclobenzaprine, ipratropium-albuterol, oxyCODONE-acetaminophen  Micro Results Recent Results (from the past 240 hour(s))  Blood culture (routine x 2)     Status: None (Preliminary result)   Collection Time: 09/01/18  3:59 PM   Specimen: BLOOD RIGHT HAND  Result Value Ref Range Status   Specimen Description BLOOD RIGHT HAND  Final   Special Requests   Final    BOTTLES DRAWN AEROBIC AND ANAEROBIC Blood Culture results may not be optimal  due to an inadequate volume of blood received in culture bottles   Culture   Final    NO GROWTH 2 DAYS Performed at [facility], [place] [address] [place], [place] [address]    Report Status PENDING  Incomplete  Blood culture (routine x 2)     Status: None (Preliminary result)   Collection Time: 09/01/18  4:06 PM   Specimen: BLOOD LEFT FOREARM  Result Value Ref Range Status   Specimen Description BLOOD LEFT FOREARM  Final   Special Requests   Final    BOTTLES DRAWN AEROBIC AND ANAEROBIC Blood Culture results may not be optimal due to an inadequate volume of blood received in culture bottles   Culture   Final    NO GROWTH 2 DAYS Performed at [facility], [place] [address], [place],  [address]    Report Status PENDING  Incomplete  SARS CORONAVIRUS 2 Nasal Swab Aptima Multi Swab     Status: None   Collection Time: 09/03/18  6:33 AM   Specimen: Aptima Multi Swab; Nasal Swab  Result Value Ref Range Status   SARS Coronavirus 2 NEGATIVE NEGATIVE Final  Comment: (NOTE) SARS-CoV-2 target nucleic acids are NOT DETECTED. The SARS-CoV-2 RNA is generally detectable in upper and lower respiratory specimens during the acute phase of infection. Negative results do not preclude SARS-CoV-2 infection, do not rule out co-infections with other pathogens, and should not be used as the sole basis for treatment or other patient management decisions. Negative results must be combined with clinical observations, patient history, and epidemiological information. The expected result is Negative. Fact Sheet for Patients: SugarRoll.be Fact Sheet for Healthcare Providers: https://www.woods-mathews.com/ This test is not yet approved or cleared by the [place] FDA and  has been authorized for detection and/or diagnosis of SARS-CoV-2 by FDA under an Emergency Use Authorization (EUA). This EUA will remain  in effect (meaning this test can be used)  for the duration of the COVID-19 declaration under Section 56 4(b)(1) of the Act, 21 U.S.C. section 360bbb-3(b)(1), unless the authorization is terminated or revoked sooner. Performed at [facility], [place] [address] [place], [place] [address]   MRSA PCR Screening     Status: Abnormal   Collection Time: 09/03/18  8:37 PM   Specimen: Nasal Mucosa; Nasopharyngeal  Result Value Ref Range Status   MRSA by PCR POSITIVE (A) NEGATIVE Final    Comment:        The GeneXpert MRSA Assay (FDA approved for NASAL specimens only), is one component of a comprehensive MRSA colonization surveillance program. It is not intended to diagnose MRSA infection nor to guide or monitor treatment for MRSA infections. RESULT CALLED TO, READ BACK BY AND VERIFIED WITH: STEFFENS,M @ 0447 ON [id number] BY POTEAT,S Performed at [facility] [address], [place], [place] [address]     Radiology Reports Dg Chest 2 View  Result Date: 09/01/2018 CLINICAL DATA:  Chills and cough. EXAM: CHEST - 2 VIEW COMPARISON:  08/01/2018 FINDINGS: Low volumes. The cardio pericardial silhouette is enlarged. Pulmonary vascular congestion noted with interstitial pulmonary edema pattern. No substantial pleural effusion. The visualized bony structures of the thorax are intact. Degenerative changes noted left shoulder. IMPRESSION: Cardiomegaly with interstitial pulmonary edema. Electronically Signed   By: Misty Stanley M.D.   On: 09/01/2018 16:12   Dg Chest Port 1 View  Result Date: 09/03/2018 CLINICAL DATA:  Shortness of breath and wheezing EXAM: PORTABLE CHEST 1 VIEW COMPARISON:  Two days ago FINDINGS: Cardiomegaly with perihilar vascular congestion. Lung volumes are low. No effusion, Kerley lines, or pneumothorax. IMPRESSION: Cardiomegaly and vascular congestion with possible perihilar edema. Electronically Signed   By: Monte Fantasia M.D.   On: 09/03/2018 04:14    Time Spent in minutes  35   Christell Steinmiller M.D on 09/04/2018 at 7:38 AM  Between 7am to 7pm - Pager - [phone]  After 7pm go to [email or web address] - password [facility]  [facility] -  Office  [phone]

## 2018-09-04 NOTE — Consult Note (Signed)
[facility] Nurse wound consult note Reason for Consult: Multiple wounds.  This patient is known to our department and was last seen by this writer on 07/24/18. Bilateral heel pressure injuries, left hip healing pressure injury and healed sacral pressure injury Wound type:Pressure plus suspected PVD/PAD Pressure Injury POA: Yes Measurement: Right heel: Stage 3 with 3 x 7 area in center consisting of yellow fibrinous slough autolytically debriding.  Will add silver hydrofiber for antimicrobial property as well as absorptive capacity for the serous exudate with strong odor. Left foot (lateral):  2.5 x 2 area of stable black eschar, dry.  Will add betadine swab application for antimicrobial and astringent properties Left heel: 2.5 x 2.5cm stable dry eschar, dry.  Will add betadine swab application for antimicrobial and astringent properties Left hip: Nearly healed (since last admission): 0.5cm round x 0.1cm pink, dry.  WIll cover/protect with silicone foam Sacrum:  Healed (since last admission): Will protect with silicone foam. Wound bed: As described above Drainage (amount, consistency, odor) As described above Periwound:Intact, dry Dressing procedure/placement/frequency: As described above. A mattress replacement was ordered and placed last evening. The patient's feet will be padded with pillows due to the extreme lateral rotation of the feet/heels; she was not able to benefit from the pressure redistribution heel boots provided during last admission. Turning and repositioning is in place.  Healing of the left hip and sacral pressure injuries since last admission is noted. While the left foot with its areas of eschar is stable, the right heel is open and draining and is vulnerable as a potential source of infection. In the past, patient has not been amenable to surgical intervention. If the right heel deteriorates, consultation with Orthopedic Surgery may again be indicated.  [facility] nursing team will not follow,  but will remain available to this patient, the nursing and medical teams.  Please re-consult if needed. Thanks, Maudie Flakes, MSN, RN, [place], Arther Abbott  Pager# [phone]

## 2018-09-04 NOTE — Progress Notes (Signed)
Left upper extremity venous duplex completed. Preliminary results in Chart review CV Proc. [place],RVS 09/04/2018,12:06 PM

## 2018-09-04 NOTE — Evaluation (Signed)
Physical Therapy Evaluation Patient Details Name: [name] MRN: [id number] DOB: 14-May-1940 Today's Date: 09/04/2018   History of Present Illness  78 yo female admitted from [facility] with shortness of breath, CXR reveals pulmonary edema consistent with heart failure. 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  Clinical Impression  Pt presents with LE weakness, severe bilateral forefoot valgus, compromised skin integrity of bilateral heels, difficulty performing mobility tasks, dyspnea on exertion with sats 88-92% on 3LO2, and decreased activity tolerance due to cardiopulmonary status and deconditioning. Pt to benefit from acute PT to address deficits. Pt eager to get OOB to recliner, achieved through mod assist +2 scoot pivot to recliner. PT placed a lift pad under pt to facilitate back to bed this pm. PT recommending return to SNF to address mobility deficits. PT to progress mobility as tolerated, and will continue to follow acutely.      Follow Up Recommendations SNF;Supervision/Assistance - 24 hour    Equipment Recommendations  Other (comment)(defer)    Recommendations for Other Services       Precautions / Restrictions Precautions Precautions: Fall Precaution Comments: on 3LO2 Restrictions Weight Bearing Restrictions: No Other Position/Activity Restrictions: WBAT      Mobility  Bed Mobility Overal bed mobility: Needs Assistance Bed Mobility: Supine to Sit     Supine to sit: Mod assist;HOB elevated     General bed mobility comments: Mod assist for completion of translation of LEs to EOB, trunk elevation, and scooting to EOB. Pt with use of bedrails to scoot to EOB and lean forward.  Transfers Overall transfer level: Needs assistance   Transfers: Lateral/Scoot Transfers          Lateral/Scoot Transfers: Mod assist;+2 safety/equipment General transfer comment: Mod  assist for translation of trunk, elevation of trunk and placement of pt into recliner. Pt able to power up on UEs to scoot x4 into chair. Pt also required positioning assist with bedpads upon arrival to recliner.  Ambulation/Gait Ambulation/Gait assistance: (NT)              Stairs            Wheelchair Mobility    Modified Rankin (Stroke Patients Only)       Balance Overall balance assessment: Needs assistance Sitting-balance support: Bilateral upper extremity supported;Feet supported Sitting balance-Leahy Scale: Fair Sitting balance - Comments: able to sit unsupported and scoot laterally to perform scoot pivot to recliner; pt sat EOB ~10 minutes to participate in LE exercises and scoot pivot   Standing balance support: (NT)                                 Pertinent Vitals/Pain Pain Assessment: No/denies pain    Home Living Family/patient expects to be discharged to:: Skilled nursing facility                 Additional Comments: chronically O2 dependent (2L)    Prior Function Level of Independence: Needs assistance   Gait / Transfers Assistance Needed: Pt reports she has been working with PT on transfers, short distance ambulation. Pt states she has not been making good progress because of medical complications that keep bringing her to the hospital.  ADL's / Homemaking Assistance Needed: Pt reports assist for meals, dressing, bathing, and transfers        Hand Dominance   Dominant Hand: Right  Extremity/Trunk Assessment   Upper Extremity Assessment Upper Extremity Assessment: Generalized weakness    Lower Extremity Assessment Lower Extremity Assessment: Generalized weakness(Pt able to perform limited ankle pumps, hip and knee flexion/extension in supine with more difficulty on R, SLR with min lift assist bilaterally. Pt with severe forefoot valgus bilaterally, R>L)    Cervical / Trunk Assessment Cervical / Trunk Assessment:  Normal  Communication   Communication: No difficulties;Other (comment)(shortness of breath limiting fluidity of speech)  Cognition Arousal/Alertness: Awake/alert Behavior During Therapy: WFL for tasks assessed/performed Overall Cognitive Status: Within Functional Limits for tasks assessed                                        General Comments General comments (skin integrity, edema, etc.): Pt on 3LO2 via  with sats 88-93% during session. Pt with sores on bilateral heels, RN to redo dressing because R heel dressing coming off.    Exercises General Exercises - Lower Extremity Long Arc Quad: AROM;Both;10 reps;Seated Hip Flexion/Marching: AROM;Both;10 reps;Seated   Assessment/Plan    PT Assessment Patient needs continued PT services  PT Problem List Cardiopulmonary status limiting activity;Decreased activity tolerance;Decreased range of motion;Decreased strength;Decreased balance;Decreased knowledge of use of DME;Decreased mobility;Decreased skin integrity       PT Treatment Interventions DME instruction;Therapeutic activities;Gait training;Patient/family education;Therapeutic exercise;Balance training;Functional mobility training    PT Goals (Current goals can be found in the Care Plan section)  Acute Rehab PT Goals Patient Stated Goal: get stronger, walk PT Goal Formulation: With patient Time For Goal Achievement: 09/18/18 Potential to Achieve Goals: Good    Frequency Min 2X/week   Barriers to discharge        Co-evaluation               AM-PAC PT "6 Clicks" Mobility  Outcome Measure Help needed turning from your back to your side while in a flat bed without using bedrails?: A Lot Help needed moving from lying on your back to sitting on the side of a flat bed without using bedrails?: A Lot Help needed moving to and from a bed to a chair (including a wheelchair)?: A Lot Help needed standing up from a chair using your arms (e.g., wheelchair or  bedside chair)?: Total Help needed to walk in hospital room?: Total Help needed climbing 3-5 steps with a railing? : Total 6 Click Score: 9    End of Session Equipment Utilized During Treatment: Gait belt;Oxygen Activity Tolerance: Patient tolerated treatment well;Patient limited by fatigue Patient left: in chair;with chair alarm set;with call bell/phone within reach;with nursing/sitter in room Nurse Communication: Mobility status PT Visit Diagnosis: Other abnormalities of gait and mobility (R26.89);Muscle weakness (generalized) (M62.81)    Time: 1300-1330 PT Time Calculation (min) (ACUTE ONLY): 30 min   Charges:   PT Evaluation $PT Eval Low Complexity: 1 Low PT Treatments $Therapeutic Activity: 8-22 mins        Matthewjames Petrasek Conception Chancy, PT Acute Rehabilitation Services Pager [phone]  Office [phone]  Brison Fiumara D Nashaly Dorantes 09/04/2018, 2:28 PM

## 2018-09-05 DIAGNOSIS — R7989 Other specified abnormal findings of blood chemistry: Secondary | ICD-10-CM

## 2018-09-05 DIAGNOSIS — N289 Disorder of kidney and ureter, unspecified: Secondary | ICD-10-CM

## 2018-09-05 DIAGNOSIS — I5042 Chronic combined systolic (congestive) and diastolic (congestive) heart failure: Secondary | ICD-10-CM

## 2018-09-05 DIAGNOSIS — I509 Heart failure, unspecified: Secondary | ICD-10-CM

## 2018-09-05 DIAGNOSIS — N184 Chronic kidney disease, stage 4 (severe): Secondary | ICD-10-CM

## 2018-09-05 DIAGNOSIS — I272 Pulmonary hypertension, unspecified: Secondary | ICD-10-CM

## 2018-09-05 DIAGNOSIS — I5033 Acute on chronic diastolic (congestive) heart failure: Secondary | ICD-10-CM

## 2018-09-05 LAB — GLUCOSE, CAPILLARY
Glucose-Capillary: 144 mg/dL — ABNORMAL HIGH (ref 70–99)
Glucose-Capillary: 127 mg/dL — ABNORMAL HIGH (ref 70–99)
Glucose-Capillary: 129 mg/dL — ABNORMAL HIGH (ref 70–99)
Glucose-Capillary: 171 mg/dL — ABNORMAL HIGH (ref 70–99)

## 2018-09-05 LAB — BASIC METABOLIC PANEL
Anion gap: 8 (ref 5–15)
BUN: 91 mg/dL — ABNORMAL HIGH (ref 8–23)
CO2: 28 mmol/L (ref 22–32)
Calcium: 8.5 mg/dL — ABNORMAL LOW (ref 8.9–10.3)
Chloride: 103 mmol/L (ref 98–111)
Creatinine, Ser: 2.41 mg/dL — ABNORMAL HIGH (ref 0.44–1.00)
GFR calc Af Amer: 22 mL/min — ABNORMAL LOW (ref >60)
GFR calc non Af Amer: 19 mL/min — ABNORMAL LOW (ref >60)
Glucose, Bld: 213 mg/dL — ABNORMAL HIGH (ref 70–99)
Potassium: 4.4 mmol/L (ref 3.5–5.1)
Sodium: 139 mmol/L (ref 135–145)

## 2018-09-05 MED ORDER — PRO-STAT SUGAR FREE PO LIQD
30.0000 mL | Freq: Two times a day (BID) | ORAL | Status: DC
  Administered 2018-09-05 – 2018-09-10 (×9): 30 mL via ORAL
  Filled 2018-09-05 (×8): qty 30

## 2018-09-05 MED ORDER — FUROSEMIDE 10 MG/ML IJ SOLN
80.0000 mg | Freq: Two times a day (BID) | INTRAMUSCULAR | Status: DC
  Administered 2018-09-05 – 2018-09-12 (×14): 80 mg via INTRAVENOUS
  Filled 2018-09-05 (×14): qty 8

## 2018-09-05 MED ORDER — ENSURE ENLIVE PO LIQD
237.0000 mL | Freq: Two times a day (BID) | ORAL | Status: DC
  Administered 2018-09-05 – 2018-09-10 (×6): 237 mL via ORAL

## 2018-09-05 NOTE — Progress Notes (Signed)
Initial Nutrition Assessment  DOCUMENTATION CODES:   Obesity unspecified  INTERVENTION:  - will order Ensure Enlive BID, each supplement provides 350 kcal and 20 grams of protein. - will order 30 mL Prostat BID, each supplement provides 100 kcal and 15 grams of protein. - will order Magic Cup with dinner meals, each supplement provides 290 kcal and 9 grams of protein. - continue to encourage PO intakes.    NUTRITION DIAGNOSIS:   Increased nutrient needs related to wound healing as evidenced by estimated needs.  GOAL:   Patient will meet greater than or equal to 90% of their needs  MONITOR:   PO intake, Supplement acceptance, Labs, Weight trends, Skin  REASON FOR ASSESSMENT:   Other (Comment)(Pressure Injury report)  ASSESSMENT:   [name] with medical hx of CHF (recent 2D echo with normal EF), type 2 DM, COPD with chronic respiratory failure on nocturnal home O2, anemia, OSA, PUD, HTN, and hyperlipidemia. She presented to the ED on 8/24 d/t SOB for several days. She was noted to be hypoxic with O2 sat of 88% on 4L nasal cannula. EMS noted patient had wheezing and rales and treated with epinephrine and nitroglycerin. She was found tachypneic and hypoxic with acute on CHF with CXR showed pulmonary vascular congestion. Also noted for acute on CKD and hyperkalemia. Given IV Lasix 80 mg x 1 and admitted to hospital service.  Per review, patient consumed 85% of lunch yesterday (571 kcal, 27 grams protein) and 100% of breakfast this AM (539 kcal, 29 grams protein). Patient reports she had a decreased appetite for several days PTA but appetite has now returned and appetite was also good/at baseline prior to the few days PTA.   Patient with multiple wounds and focus should be on protein intake. Glucerna Shake was ordered BID and patient has accepted both bottles of supplement; will change to supplements outlined above in order to provide increased amount of protein  per volume. WOC following.   Per chart review, current weight is 193 lb and weight is the highest it has been all year.  Per notes: - acute on CHF - acute on chronic respiratory failure with hypoxia - AKI on CKD stage 4--resolved s/p diuresis - hyperkalemia--resolved with diuresis   Labs reviewed; CBG: 144 mg/dl, BUN: 91 mg/dl, creatinine: 2.41 mg/dl, Ca: 8.5 mg/dl, GFR: 22 ml/min. Medications reviewed; 100 mg colace BID, 0.5 mg folvite/day, 80 mg IV lasix BID, sliding scale novolog, 10 units levemir/day, 1 tablet prosight/day, 100 mg vitamin B6/day, 1 tablet senokot BID, 1000 mcg oral cyanocobalamin/day, 400 units vitamin E/day.     NUTRITION - FOCUSED PHYSICAL EXAM:  completed; no muscle and no fat wasting, mild edema to all extremities.  Diet Order:   Diet Order            Diet heart healthy/carb modified Room service appropriate? Yes; Fluid consistency: Thin  Diet effective now              EDUCATION NEEDS:   No education needs have been identified at this time  Skin:  Skin Assessment: Skin Integrity Issues: Skin Integrity Issues:: Stage II, Stage III, Unstageable Stage II: L hip and mid sacrum Stage III: R heel Unstageable: L foot and L heel  Last BM:  8/25  Height:   Ht Readings from Last 1 Encounters:  07/25/18 5\' 5"  (1.651 m)    Weight:   Wt Readings from Last 1 Encounters:  09/05/18 87.4 kg    Ideal Body  Weight:  56.8 kg  BMI:  Body mass index is 32.06 kg/m.  Estimated Nutritional Needs:   Kcal:  2275-2450 kcal  Protein:  125-135 grams  Fluid:  >/= 2.2 L/day     Jarome Matin, MS, RD, LDN, [facility] Inpatient Clinical Dietitian Pager # [phone] After hours/weekend pager # [phone]

## 2018-09-05 NOTE — Progress Notes (Signed)
[facility]                                                                              Patient Demographics  [name], is a 78 y.o. female, DOB - [birth year], ZSW:[id number]  Admit date - 09/03/2018   Admitting Physician Mir Marry Guan, MD  Outpatient Primary MD for the patient is Garwin Brothers, MD  Outpatient specialists:   LOS - 2  days   Medical records reviewed and are as summarized below:    Chief Complaint  Patient presents with  . Shortness of Breath       Brief summary   Patient is a [name] with combined systolic and diastolic CHF (recent 2D echo with normal EF), type 2 diabetes mellitus, COPD with chronic respiratory failure on nocturnal home O2, anemia, obstructive sleep apnea history of peptic ulcer disease, hypertension and hyperlipidemia presented with increased breathing for past few days.  She was evaluated at [facility], ED today prior to admission for shortness of breath and discharged back.  She was noted to be hypoxic with O2 sat of 88% on 4 L nasal cannula.  EMS noted patient had wheezing and rales and treated with epinephrine and nitroglycerin. She was found tachypneic and hypoxic with acute on chronic combined systolic and diastolic CHF with chest x-ray showing pulmonary vascular congestion.  Also noted for acute on chronic kidney disease and hyperkalemia.  Patient was given Lasix 80 mg IV x1 and admitted for further work-up. COVID-19 test negative  Assessment & Plan    Principal Problem: Acute on chronic respiratory failure with hypoxia  acute on chronic combined systolic and diastolic CHF (congestive heart failure), NYHA class IV, ([place]) -Patient presented with hypoxia, bilateral rails, chest x-ray with pulmonary vascular congestion, also with acute on chronic CKD -Patient was placed on IV Lasix 40 mg every 12 hours, on torsemide at home which was held.  Patient is not on aspirin, beta-blocker and ACE  inhibitor. -2D echo 6/9 showed EF > 65%, normal right ventricular systolic function. -Concern of possible cardiorenal syndrome, creatinine again trending up 2.4, requested cardiology consult  Active Problems: Acute kidney injury on CKD stage IV -Suspect cardiorenal syndrome with acute CHF, decreased renal perfusion -Creatinine improving on 8/20 5-2.3 now trending up again to 2.4 -[facility] outpatient  Hyperkalemia -Resolved with diuresis  COPD, underlying chronic respiratory failure on 2 L chronic O2 at night -Currently stable, no exacerbation. -Continue O2 2 L, home inhalers  Diabetes mellitus type 2, uncontrolled with CKD -CBGs currently stable, continue Lantus and sliding scale insulin   Elevated troponin -Likely due to demand ischemia with CHF, CKD, EKG unremarkable   Pressure injury wounds, POA -Bilateral heel pressure injuries, stage III on right,  -Unstageable left foot, full-thickness tissue loss, stage II left hip -Wound care following, continue nursing care -Continue nutritional recommendation for wound healing  Code Status: Full code DVT Prophylaxis:  Lovenox Family Communication: Discussed all imaging results, lab results, explained to the patient    Disposition Plan: PT evaluation recommended skilled nursing facility, return to skilled nursing facility once improved.  Continue IV  diuresis.  Time Spent in minutes 35 minutes  Procedures:  None  Consultants:   Cardiology  Antimicrobials:   Anti-infectives (From admission, onward)   None          Medications  Scheduled Meds: . amLODipine  10 mg Oral Daily  . chlorhexidine  15 mL Mouth Rinse BID  . Chlorhexidine Gluconate Cloth  6 each Topical Q0600  . docusate sodium  100 mg Oral BID  . feeding supplement (ENSURE ENLIVE)  237 mL Oral BID BM  . feeding supplement (PRO-STAT SUGAR FREE 64)  30 mL Oral BID  . folic acid  0.5 mg Oral Daily  . furosemide  80 mg  Intravenous Q12H  . heparin  5,000 Units Subcutaneous Q8H  . insulin aspart  0-15 Units Subcutaneous TID WC  . insulin aspart  0-5 Units Subcutaneous QHS  . insulin detemir  10 Units Subcutaneous QHS  . iron polysaccharides  150 mg Oral BID  . mouth rinse  15 mL Mouth Rinse q12n4p  . montelukast  10 mg Oral QHS  . multivitamin  1 tablet Oral Daily  . mupirocin ointment  1 application Nasal BID  . omega-3 acid ethyl esters  1 g Oral Daily  . pantoprazole  40 mg Oral Q0600  . pyridOXINE  100 mg Oral Daily  . senna  1 tablet Oral BID  . simvastatin  20 mg Oral Daily  . traZODone  50 mg Oral QHS  . vitamin B-12  1,000 mcg Oral Daily  . vitamin E  400 Units Oral Daily   Continuous Infusions: PRN Meds:.acetaminophen **OR** acetaminophen, cyclobenzaprine, ipratropium-albuterol, oxyCODONE-acetaminophen      Subjective:   [name] was seen and examined today.  Diuresing on IV Lasix, no chest pain or acute shortness of breath.  No acute events overnight.  No fevers or chills. Patient denies abdominal pain, N/V/D/C, new weakness, numbess, tingling.  Objective:   Vitals:   09/04/18 1444 09/04/18 2220 09/05/18 0511 09/05/18 0600  BP: (!) 159/64 (!) 168/61 (!) 155/66   Pulse: 79 77 77   Resp: 19 20 20    Temp:  98.7 F (37.1 C) 97.7 F (36.5 C)   TempSrc:  Oral Oral   SpO2: 91% 90% 92%   Weight:    87.4 kg    Intake/Output Summary (Last 24 hours) at 09/05/2018 1236 Last data filed at 09/05/2018 0900 Gross per 24 hour  Intake 600 ml  Output 1400 ml  Net -800 ml     Wt Readings from Last 3 Encounters:  09/05/18 87.4 kg  08/02/18 81.6 kg  07/10/18 72 kg     Exam  General: Alert and oriented x 3, NAD  Eyes:   HEENT:    Cardiovascular: S1 S2 auscultated, no murmurs, RRR  Respiratory: Bibasilar crackles  Gastrointestinal: Soft, nontender, nondistended, + bowel sounds  Ext: Bilateral leg edema   Neuro: No new deficits   musculoskeletal: No digital cyanosis,  clubbing  Skin: Multiple decub, stage III chronic right heel ulcer, stable black eschar on left foot, healed superficial pressure ulcer over the left hip and sacrum  Psych: Normal affect and demeanor, alert and oriented x3    Data Reviewed:  I have personally reviewed following labs and imaging studies  Micro Results Recent Results (from the past 240 hour(s))  Blood culture (routine x 2)     Status: None (Preliminary result)   Collection Time: 09/01/18  3:59 PM   Specimen: BLOOD RIGHT HAND  Result Value Ref  Range Status   Specimen Description BLOOD RIGHT HAND  Final   Special Requests   Final    BOTTLES DRAWN AEROBIC AND ANAEROBIC Blood Culture results may not be optimal due to an inadequate volume of blood received in culture bottles   Culture   Final    NO GROWTH 4 DAYS Performed at [facility], [place] [address], [place], [place] [address]    Report Status PENDING  Incomplete  Blood culture (routine x 2)     Status: None (Preliminary result)   Collection Time: 09/01/18  4:06 PM   Specimen: BLOOD LEFT FOREARM  Result Value Ref Range Status   Specimen Description BLOOD LEFT FOREARM  Final   Special Requests   Final    BOTTLES DRAWN AEROBIC AND ANAEROBIC Blood Culture results may not be optimal due to an inadequate volume of blood received in culture bottles   Culture   Final    NO GROWTH 4 DAYS Performed at [facility], [place] [address], [place], [place] [address]    Report Status PENDING  Incomplete  SARS CORONAVIRUS 2 Nasal Swab Aptima Multi Swab     Status: None   Collection Time: 09/03/18  6:33 AM   Specimen: Aptima Multi Swab; Nasal Swab  Result Value Ref Range Status   SARS Coronavirus 2 NEGATIVE NEGATIVE Final    Comment: (NOTE) SARS-CoV-2 target nucleic acids are NOT DETECTED. The SARS-CoV-2 RNA is generally detectable in upper and lower respiratory specimens during the acute phase of infection. Negative results do not preclude SARS-CoV-2 infection, do  not rule out co-infections with other pathogens, and should not be used as the sole basis for treatment or other patient management decisions. Negative results must be combined with clinical observations, patient history, and epidemiological information. The expected result is Negative. Fact Sheet for Patients: SugarRoll.be Fact Sheet for Healthcare Providers: https://www.woods-mathews.com/ This test is not yet approved or cleared by the [place] FDA and  has been authorized for detection and/or diagnosis of SARS-CoV-2 by FDA under an Emergency Use Authorization (EUA). This EUA will remain  in effect (meaning this test can be used) for the duration of the COVID-19 declaration under Section 56 4(b)(1) of the Act, 21 U.S.C. section 360bbb-3(b)(1), unless the authorization is terminated or revoked sooner. Performed at [facility], [place] [address], [place], [place] [address]   MRSA PCR Screening     Status: Abnormal   Collection Time: 09/03/18  8:37 PM   Specimen: Nasal Mucosa; Nasopharyngeal  Result Value Ref Range Status   MRSA by PCR POSITIVE (A) NEGATIVE Final    Comment:        The GeneXpert MRSA Assay (FDA approved for NASAL specimens only), is one component of a comprehensive MRSA colonization surveillance program. It is not intended to diagnose MRSA infection nor to guide or monitor treatment for MRSA infections. RESULT CALLED TO, READ BACK BY AND VERIFIED WITH: STEFFENS,M @ 0447 ON [id number] BY POTEAT,S Performed at [place] [address] [place], [place] [address]     Radiology Reports Dg Chest 2 View  Result Date: 09/01/2018 CLINICAL DATA:  Chills and cough. EXAM: CHEST - 2 VIEW COMPARISON:  08/01/2018 FINDINGS: Low volumes. The cardio pericardial silhouette is enlarged. Pulmonary vascular congestion noted with interstitial pulmonary edema pattern. No substantial pleural effusion. The  visualized bony structures of the thorax are intact. Degenerative changes noted left shoulder. IMPRESSION: Cardiomegaly with interstitial pulmonary edema. Electronically Signed  By: Misty Stanley M.D.   On: 09/01/2018 16:12   Dg Chest Port 1 View  Result Date: 09/03/2018 CLINICAL DATA:  Shortness of breath and wheezing EXAM: PORTABLE CHEST 1 VIEW COMPARISON:  Two days ago FINDINGS: Cardiomegaly with perihilar vascular congestion. Lung volumes are low. No effusion, Kerley lines, or pneumothorax. IMPRESSION: Cardiomegaly and vascular congestion with possible perihilar edema. Electronically Signed   By: Monte Fantasia M.D.   On: 09/03/2018 04:14   Vas [place] Upper Extremity Venous Duplex  Result Date: 09/04/2018 UPPER VENOUS STUDY  Indications: Edema Left forearm and arm Limitations: Body habitus and forearm edema. Comparison Study: No previous study available for comparison Performing Technologist: Toma Copier RVS  Examination Guidelines: A complete evaluation includes B-mode imaging, spectral Doppler, color Doppler, and power Doppler as needed of all accessible portions of each vessel. Bilateral testing is considered an integral part of a complete examination. Limited examinations for reoccurring indications may be performed as noted.  Right Findings: +----------+------------+---------+-----------+----------+-------+ RIGHT     CompressiblePhasicitySpontaneousPropertiesSummary +----------+------------+---------+-----------+----------+-------+ Subclavian    Full       Yes       Yes                      +----------+------------+---------+-----------+----------+-------+  Left Findings: +----------+------------+---------+-----------+----------+---------------------+ LEFT      CompressiblePhasicitySpontaneousProperties       Summary        +----------+------------+---------+-----------+----------+---------------------+ IJV           Full       Yes       Yes                                     +----------+------------+---------+-----------+----------+---------------------+ Subclavian    Full       Yes       Yes                                    +----------+------------+---------+-----------+----------+---------------------+ Axillary      Full       Yes       Yes                                    +----------+------------+---------+-----------+----------+---------------------+ Brachial      Full       Yes       Yes                                    +----------+------------+---------+-----------+----------+---------------------+ Radial        Full                                                        +----------+------------+---------+-----------+----------+---------------------+ Ulnar         Full                                                        +----------+------------+---------+-----------+----------+---------------------+  Cephalic    Partial                                 Acute in the [facility] into                                                        the mid upper arm   +----------+------------+---------+-----------+----------+---------------------+ Basilic                                              Unable to visualize                                                       the forearm due to                                                       edema. The upper arm                                                           is negative      +----------+------------+---------+-----------+----------+---------------------+  Summary:  Right: No evidence of thrombosis in the subclavian.  Left: No evidence of deep vein thrombosis in the upper extremity. No evidence of thrombosis in the subclavian. Findings consistent with acute superficial vein thrombosis involving the left cephalic vein.  *See table(s) above for measurements and observations.  Diagnosing physician: Deitra Mayo MD Electronically signed by  Deitra Mayo MD on 09/04/2018 at 1:22:51 PM.    Final     Lab Data:  CBC: Recent Labs  Lab 09/01/18 1538 09/03/18 0429 09/04/18 0449  WBC 8.1 7.7 7.6  NEUTROABS 6.3 5.6  --   HGB 8.6* 9.1* 8.7*  HCT 29.3* 30.3* 29.7*  MCV 99.0 98.4 99.3  PLT 216 205 353   Basic Metabolic Panel: Recent Labs  Lab 09/01/18 1538 09/03/18 0345 09/04/18 0449 09/05/18 0448  NA 142 143 142 139  K 5.5* 5.9* 4.8 4.4  CL 107 109 106 103  CO2 25 26 29 28   GLUCOSE 187* 142* 143* 213*  BUN 95* 94* 92* 91*  CREATININE 2.74* 2.52* 2.33* 2.41*  CALCIUM 8.4* 8.4* 8.6* 8.5*   GFR: Estimated Creatinine Clearance: 21.7 mL/min (A) (by C-G formula based on SCr of 2.41 mg/dL (H)). Liver Function Tests: Recent Labs  Lab 09/01/18 1538  AST 19  ALT 27  ALKPHOS 58  BILITOT 0.8  PROT 7.3  ALBUMIN 3.1*   No results for input(s): LIPASE, AMYLASE in the last 168 hours. No results for input(s): AMMONIA in the last 168 hours. Coagulation Profile: No results for input(s): INR, PROTIME in  the last 168 hours. Cardiac Enzymes: No results for input(s): CKTOTAL, CKMB, CKMBINDEX, TROPONINI in the last 168 hours. BNP (last 3 results) No results for input(s): PROBNP in the last 8760 hours. HbA1C: No results for input(s): HGBA1C in the last 72 hours. CBG: Recent Labs  Lab 09/04/18 1144 09/04/18 1636 09/04/18 2215 09/05/18 0802 09/05/18 1224  GLUCAP 128* 181* 143* 144* 127*   Lipid Profile: No results for input(s): CHOL, HDL, LDLCALC, TRIG, CHOLHDL, LDLDIRECT in the last 72 hours. Thyroid Function Tests: No results for input(s): TSH, T4TOTAL, FREET4, T3FREE, THYROIDAB in the last 72 hours. Anemia Panel: No results for input(s): VITAMINB12, FOLATE, FERRITIN, TIBC, IRON, RETICCTPCT in the last 72 hours. Urine analysis:    Component Value Date/Time   COLORURINE STRAW (A) 07/23/2018 1656   APPEARANCEUR CLEAR 07/23/2018 1656   LABSPEC 1.011 07/23/2018 1656   PHURINE 5.0 07/23/2018 1656   GLUCOSEU  NEGATIVE 07/23/2018 1656   HGBUR NEGATIVE 07/23/2018 1656   BILIRUBINUR NEGATIVE 07/23/2018 1656   KETONESUR NEGATIVE 07/23/2018 1656   PROTEINUR NEGATIVE 07/23/2018 1656   UROBILINOGEN 0.2 12/16/2011 1717   NITRITE NEGATIVE 07/23/2018 1656   LEUKOCYTESUR NEGATIVE 07/23/2018 1656     Ripudeep Rai M.D. [facility] 09/05/2018, 12:36 PM  Pager: [phone] Between 7am to 7pm - call Pager - [phone]  After 7pm go to [email or web address] - password [email or web address]  Call night coverage person covering after 7pm

## 2018-09-05 NOTE — Consult Note (Signed)
Cardiology Consultation:   Patient ID: [name] MRN: [id number]; DOB: Feb 01, 1940  Admit date: 09/03/2018 Date of Consult: 09/05/2018  Primary Care Provider: Garwin Brothers, MD Primary Cardiologist: [facility] (Dr. Harrell Gave) Primary Electrophysiologist:  None    Patient Profile:   [name] is a 78 y.o. female with a [name] of chronic diastolic CHF with EF >55%, COPD and asthma with chronic respiratory failure on home O2, obstructive sleep apnea not compliant with CPAP,  hypertension, hyperlipidemia, type 2 diabetes mellitus on insulin, CKD stage IV, GERD, PUD, chronic anemia, and depression, who is being seen today for Nicole evaluation of acute CHF at Nicole request of Dr. Tana Coast.  [name] of Present Illness:   [name] is a 78 year old female with [name]. Patient lives in an assisted living facility [facility] She states she sees a Film/video editor on [employer] who prescribes her Torsemide but I am unable to see that she has ever seen a Film/video editor in [place] system or in [place]. She does not remember her Cardiologists name. Last Echo from 06/19/2018 showed LVEF of >65% with normal wall motion, mild LVH, grade 2 diastolic dysfunction, and elevated left atrial and left ventricular end-diastolic pressures. RV noted to be mildly enlarged with normal systolic function but severely elevated RVSP of 79.2 consistent with severe pulmonary hypertension. It does not sound like she has ever had an ischemic work-up but she cannot fully remember. I tried calling [facility] to find out what Cardiologist she sees but was got a voicemail.  Patient was admitted from 07/05/2018 to 07/10/2018 for acute on chronic kidney disease stage IV which was felt to be due to over diuresis. She was on Torsemide 50mg  twice daily at home at that time. Diuretics were held during this admission and she was hydrated with improvement in renal function. Creatinine was 2.4 on  discharge and home Torsemide was reduced to 20mg  twice daily. She was readmitted  07/23/2018 to 08/02/2018 for evaluation of acute on chronic anemia with a drop in hemoglobin to 6.5 and reports of intermittent dark appearing stools. Hemoccult was positive at that time. Patient received 1 unit of PRBCs. GI was consulted and EGD was performed but showed no signs of bleeding.   Patient presented to Nicole ED via EMS on 09/01/2018 after being diagnosed with pneumonia at SNF as NP at her facility felt like she needed further evaluation. Patient had mild cough but no significant shortness of breath. Lactic acid was normal x2 and patient was afebrile with no leukocytosis. SpO2 91% on home O2. X-ray showed mild pulmonary edema but no pneumonia. BNP was elevated at 512.4 (slightly higher than 466.8 one month prior). It was not felt like patient needed to be admitted so she was discharged back to SNF.   Patient returned to Nicole ED on 09/03/2018 for worsening shortness of breath.  Patient is somewhat of a poor historian and has trouble getting her words out was very difficult to gather detailed [name] and ROS.  From what I can gather patient has chronic shortness of breath and is on home O2 at night.  She cannot remember when her breathing started to worsen but states it came on suddenly.  She denies any PND but is hard to determine if she has had any orthopnea.  She states she has had significant lower extremity edema in Nicole past but reports it has not been bad lately.  She denies any weight gain  and states she is actually been losing weight due to Nicole diuretics.  Has any recent chest pain but hard to determine if she is had any chest pain in Nicole past.  No recent fevers, chills, body aches.  She is having on productive cough over Nicole last few days.  In Nicole ED, patient tachypneic with O2 sats in Nicole 90's on 4L via nasal cannula. EKG showed normal sinus rhythm with T wave inversions in anterior leads but these were seen on prior  tracing last month. High-sensitivity troponin minimally elevated and flat at 75 >> 72. BNP elevated at 696. Chest x-ray showed cardiomegaly with vascular congestion and possible perihilar edema. WBC 7.7, Hgb 9.1, Plts 205. Na 143, K 5.9, Glucose 142, BUN 94, Scr 2.52. COVID-19 testing negative.  At Nicole time of this evaluation, patient does not feel like her breathing has substantially approved.   Patient has a remote smoking [name] could not tell me exactly when she quit.  Per chart review, looks like she quit in Nicole 1960s. She thinks her father had heart disease but is on unsure exactly what he had.    Heart Pathway Score:     Past Medical [name]:  Diagnosis Date   Adenomatous colon polyp    Anemia    Arthritis    Asthma    COPD (chronic obstructive pulmonary disease) ([place])    COPD with asthma (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  [place]   COLONOSCOPY N/A 08/14/2012   Procedure: COLONOSCOPY;  Surgeon: Ladene Artist, MD;  Location: [facility] ENDOSCOPY;  Service: Endoscopy;  Laterality: N/A;   ESOPHAGOGASTRODUODENOSCOPY (EGD) WITH PROPOFOL N/A 07/25/2018   Procedure: ESOPHAGOGASTRODUODENOSCOPY (EGD) WITH PROPOFOL;  Surgeon: Gatha Mayer, MD;  Location: [place];  Service: Endoscopy;  Laterality: N/A;   INCISE AND DRAIN ABCESS     Abdominal Wall   LIPOMA RESECTION     Right Neck   TOTAL KNEE ARTHROPLASTY     Bilateral   UMBILICAL HERNIA  REPAIR  2007   incarcerated   UPPER GASTROINTESTINAL ENDOSCOPY  2001   medoff Grade A esophagitis     Home Medications:  Prior to Admission medications   Medication Sig Start Date End Date Taking? Authorizing Provider  acetaminophen (TYLENOL) 325 MG tablet Take 650 mg by mouth every 6 (six) hours as needed for mild pain.   Yes [provider]  Amino Acids-Protein Hydrolys (FEEDING SUPPLEMENT, PRO-STAT SUGAR FREE 64,) LIQD Take 30 mLs by mouth 2 (two) times daily. 06/20/18  Yes [place] Eric J, DO  amLODipine (NORVASC) 10 MG tablet Take 10 mg by mouth daily.    Yes [provider]  calcitRIOL (ROCALTROL) 0.25 MCG capsule Take 1 capsule (0.25 mcg total) by mouth every Monday, Wednesday, and Friday. Patient taking differently: Take 0.25 mcg by mouth daily.  12/05/16  Yes Velvet Bathe, MD  cyclobenzaprine (FLEXERIL) 10 MG tablet Take 10 mg 3 (three) times daily as needed by mouth for muscle spasms.   Yes [provider]  feeding supplement, GLUCERNA SHAKE, (GLUCERNA SHAKE) LIQD Take 237 mLs by mouth 2 (two) times daily between meals. 08/02/18  Yes Mikhail, Velta Addison, DO  folic acid (FOLVITE) 536 MCG tablet Take 400 mcg by mouth daily.   Yes [provider]  Insulin Aspart (NOVOLOG FLEXPEN [place]) Inject 0-10 Units into Nicole skin See admin instructions. Per Sliding Scale ar 0600, 1100, 1600, 2100 100-150 0units 151-200 2units 201-250 4units 251-300 6units 301-350 8units 351-400 10units   Yes [provider]  insulin aspart (NOVOLOG) 100 UNIT/ML injection Inject 6 Units into Nicole skin 3 (three) times daily with meals. 06/20/18  Yes [place] Eric J, DO  insulin glargine (LANTUS) 100 UNIT/ML injection Inject 0.15 mLs (15 Units total) into Nicole skin at bedtime. 08/02/18  Yes Mikhail, Maryann, DO  ipratropium-albuterol (DUONEB) 0.5-2.5 (3) MG/3ML SOLN Take 3 mLs by nebulization every 6 (six) hours as needed (SOB, wheezing).   Yes [provider]  iron  polysaccharides (NIFEREX) 150 MG capsule Take 150 mg by mouth 2 (two) times daily.   Yes [provider]  magnesium oxide (MAG-OX) 400 MG tablet Take 400 mg by mouth daily.   Yes [provider]  montelukast (SINGULAIR) 10 MG tablet Take 1 tablet (10 mg total) by mouth at bedtime. 10/05/16  Yes Chesley Mires, MD  Multiple Vitamins-Minerals (MULTIVITAMIN ADULT PO) Take 1 tablet by mouth daily.   Yes [provider]  nitroGLYCERIN (NITROSTAT) 0.4 MG SL tablet Place 0.4 mg under Nicole tongue every 5 (five) minutes as needed for chest pain.   Yes [provider]  Omega-3 Fatty Acids (FISH OIL) 1000 MG CAPS Take 1,000 mg by mouth daily.    Yes [provider]  oxyCODONE-acetaminophen (PERCOCET/ROXICET) 5-325 MG tablet Take 1 tablet by mouth every 6 (six) hours as needed for severe pain. 08/02/18  Yes Mikhail, Velta Addison, DO  pantoprazole (PROTONIX) 40 MG tablet Take 1 tablet (40 mg total) by mouth daily at 6 (six) AM. 08/03/18  Yes Mikhail, Velta Addison, DO  Propylene Glycol (SYSTANE BALANCE) 0.6 % SOLN Apply 1 drop to eye 2 (two) times daily.    Yes [provider]  pyridOXINE (VITAMIN B-6) 100 MG tablet Take 100 mg by mouth daily.   Yes [provider]  torsemide (DEMADEX) 10 MG tablet Take 2 tablets (20 mg total) by mouth 2 (two) times daily. 07/09/18  Yes Domenic Polite, MD  traZODone (DESYREL) 50 MG tablet Take 50 mg by mouth at bedtime.    Yes [provider]  vitamin B-12 (CYANOCOBALAMIN) 1000 MCG tablet Take 1,000 mcg by mouth daily.   Yes [provider]  Vitamin D, Ergocalciferol, (DRISDOL) 50000 UNITS CAPS capsule Take 50,000 Units by mouth 2 (two) times a week. MON and THUR 04/07/14  Yes [provider]  vitamin E 400 UNIT capsule Take 400 Units by mouth daily.   Yes [provider]  ZOCOR 20 MG tablet Take 20 mg by mouth daily. 03/16/14  Yes [provider]  multivitamin (PROSIGHT) TABS tablet Take 1  tablet by mouth daily. Patient not taking: Reported on 09/03/2018 08/02/18   Cristal Ford, DO    Inpatient Medications: Scheduled Meds:  amLODipine  10 mg Oral Daily   chlorhexidine  15 mL Mouth Rinse BID   Chlorhexidine Gluconate Cloth  6 each  Topical Q0600   docusate sodium  100 mg Oral BID   feeding supplement (GLUCERNA SHAKE)  237 mL Oral BID BM   folic acid  0.5 mg Oral Daily   furosemide  40 mg Intravenous Q12H   heparin  5,000 Units Subcutaneous Q8H   insulin aspart  0-15 Units Subcutaneous TID WC   insulin aspart  0-5 Units Subcutaneous QHS   insulin detemir  10 Units Subcutaneous QHS   iron polysaccharides  150 mg Oral BID   mouth rinse  15 mL Mouth Rinse q12n4p   montelukast  10 mg Oral QHS   multivitamin  1 tablet Oral Daily   mupirocin ointment  1 application Nasal BID   omega-3 acid ethyl esters  1 g Oral Daily   pantoprazole  40 mg Oral Q0600   pyridOXINE  100 mg Oral Daily   senna  1 tablet Oral BID   simvastatin  20 mg Oral Daily   traZODone  50 mg Oral QHS   vitamin B-12  1,000 mcg Oral Daily   vitamin E  400 Units Oral Daily   Continuous Infusions:  PRN Meds: acetaminophen **OR** acetaminophen, cyclobenzaprine, ipratropium-albuterol, oxyCODONE-acetaminophen  Allergies:    Allergies  Allergen Reactions   Other Other (See Comments)    "all generics make her sick"    Social [name]:   Social [name]   Socioeconomic [name]   Marital status: Single    Spouse name: Not on file   Number of children: 0   Years of education: Not on file   Highest education level: Not on file  Occupational [name]   Occupation: Retired    Comment: Careers adviser strain: Not on file   Food insecurity    Worry: Not on file    Inability: Not on Lexicographer needs    Medical: Not on file    Non-medical: Not on file  Tobacco Use   Smoking status: Former Smoker    Packs/day: 2.00    Years:  5.00    Pack years: 10.00    Types: Cigarettes    Quit date: 01/10/1958    Years since quitting: 60.6   Smokeless tobacco: Never Used  Substance and Sexual Activity   Alcohol use: No    Comment: Quit when she quit smoking   Drug use: No   Sexual activity: Never  Lifestyle   Physical activity    Days per week: Not on file    Minutes per session: Not on file   Stress: Not on file  Relationships   Social connections    Talks on phone: Not on file    Gets together: Not on file    Attends religious service: Not on file    Active member of club or organization: Not on file    Attends meetings of clubs or organizations: Not on file    Relationship status: Not on file   Intimate partner violence    Fear of current or ex partner: Not on file    Emotionally abused: Not on file    Physically abused: Not on file    Forced sexual activity: Not on file  Other Topics Concern   Not on file  Social [name] Narrative   Retired from [employer] no children   Sister in [place]    Family [name]:    Family [name]  Problem Relation Age of Onset   Breast cancer Maternal Aunt  ROS:  Please see [name] of present illness. Difficult to obtain detailed ROS due to patient's having trouble remembering details and trouble getting her words out. Review of Systems  Constitutional: Positive for fever and weight loss. Negative for chills.  HENT: Negative for congestion.   Respiratory: Positive for cough and shortness of breath. Negative for hemoptysis and sputum production.   Cardiovascular: Negative for chest pain, palpitations, leg swelling and PND.  Gastrointestinal: Positive for nausea. Negative for abdominal pain and blood in stool.  Genitourinary: Negative for hematuria.  Musculoskeletal: Positive for joint pain (right leg).  Neurological: Negative for dizziness and loss of consciousness.  Endo/Heme/Allergies: Does not bruise/bleed easily.    Psychiatric/Behavioral: Positive for memory loss (difficulty recalling details). Negative for substance abuse.   Physical Exam/Data:   Vitals:   09/04/18 1444 09/04/18 2220 09/05/18 0511 09/05/18 0600  BP: (!) 159/64 (!) 168/61 (!) 155/66   Pulse: 79 77 77   Resp: 19 20 20    Temp:  98.7 F (37.1 C) 97.7 F (36.5 C)   TempSrc:  Oral Oral   SpO2: 91% 90% 92%   Weight:    87.4 kg    Intake/Output Summary (Last 24 hours) at 09/05/2018 0724 Last data filed at 09/05/2018 0600 Gross per 24 hour  Intake 480 ml  Output 1400 ml  Net -920 ml   Last 3 Weights 09/05/2018 09/03/2018 08/02/2018  Weight (lbs) 192 lb 10.9 oz 187 lb 14.4 oz 180 lb  Weight (kg) 87.4 kg 85.231 kg 81.647 kg     Body mass index is 32.06 kg/m.  General: 78 y.o. African-American female resting comfortably in no acute distress. HEENT: Normal. Neck: Supple. JVD elevated.  Heart: RRR. Distinct S1 and S2. II-III/VI systolic murmur noted. No gallops or rubs. Radial pulses 2+ and equal bilaterally. Lungs: Mild labored breathing while talking. Decreased breath sounds throughout with expiratory wheezes and mild bibasilar crackles. Abdomen: Soft, non-distended, and non-tender to palpation. Bowel sounds present. Extremities: Mild non-pitting edema of bilateral lower extremities but skin tight. Left upper extremity edema. Skin: Warm and dry. Hyperpigmentation of bilateral lower extremities (suspect due to chronic venous insufficiency). Both feel wrapped. Neuro: Alert and oriented x3. No focal deficits. Psych: Normal affect. Responds appropriately.   EKG:  Nicole EKG was personally reviewed and demonstrates:  Normal sinus rhythm, rate 70 bpm, with poor R wave progression and prominent T wave inversions in anterior leads as well as mild T wave inversions/T wave flattening in inferior leads. These T wave inversions were seen on prior tracing in 07/2018 but were new at that time.  Telemetry:  Telemetry was personally reviewed and  demonstrates:  Sinus rhythm with rates ranging from Nicole 50's to 80's.  Relevant CV Studies:  Upper Extremity Venous Doppler 09/04/2018: Summary: Right: No evidence of thrombosis in Nicole subclavian.   Left: No evidence of deep vein thrombosis in Nicole upper extremity. No evidence of thrombosis in Nicole subclavian. Findings consistent with acute superficial vein thrombosis involving Nicole left cephalic vein. _______________  Echocardiogram 06/19/2018: Impressions: 1. Nicole left ventricle has hyperdynamic systolic function, with an ejection fraction of >65%. Nicole cavity size was normal. There is mildly increased left ventricular wall thickness. Left ventricular diastolic Doppler parameters are consistent with  pseudonormalization. Elevated left atrial and left ventricular end-diastolic pressures No evidence of left ventricular regional wall motion abnormalities.  2. Nicole right ventricle has normal systolic function. Nicole cavity was mildly enlarged. There is no increase in right ventricular wall thickness. Right ventricular  systolic pressure is severely elevated with an estimated pressure of 79.2 mmHg.  3. Left atrial size was severely dilated.  4. Right atrial size was moderately dilated.  5. Tricuspid valve regurgitation is mild-moderate.  6. Nicole aortic valve is tricuspid. Severely thickening of Nicole aortic valve. Severe calcifcation of Nicole aortic valve. Aortic valve regurgitation is mild by color flow Doppler. Moderate stenosis of Nicole aortic valve. AV Mean Grad: 25.0 mmHg. AV Area (VTI):  1.54 cm. AV Vmax: 365.50 cm/s  7. Nicole inferior vena cava was dilated in size with >50% respiratory variability.  Laboratory Data:  High Sensitivity Troponin:   Recent Labs  Lab 09/03/18 0345 09/03/18 0600  TROPONINIHS 75* 72*     Chemistry Recent Labs  Lab 09/03/18 0345 09/04/18 0449 09/05/18 0448  NA 143 142 139  K 5.9* 4.8 4.4  CL 109 106 103  CO2 26 29 28   GLUCOSE 142* 143* 213*  BUN 94* 92* 91*    CREATININE 2.52* 2.33* 2.41*  CALCIUM 8.4* 8.6* 8.5*  GFRNONAA 18* 20* 19*  GFRAA 21* 23* 22*  ANIONGAP 8 7 8     Recent Labs  Lab 09/01/18 1538  PROT 7.3  ALBUMIN 3.1*  AST 19  ALT 27  ALKPHOS 58  BILITOT 0.8   Hematology Recent Labs  Lab 09/01/18 1538 09/03/18 0429 09/04/18 0449  WBC 8.1 7.7 7.6  RBC 2.96* 3.08* 2.99*  HGB 8.6* 9.1* 8.7*  HCT 29.3* 30.3* 29.7*  MCV 99.0 98.4 99.3  MCH 29.1 29.5 29.1  MCHC 29.4* 30.0 29.3*  RDW 18.3* 18.3* 17.8*  PLT 216 205 208   BNP Recent Labs  Lab 09/01/18 1538 09/03/18 0429  BNP 512.4* 696.0*    DDimer No results for input(s): DDIMER in Nicole last 168 hours.   Radiology/Studies:  Dg Chest 2 View  Result Date: 09/01/2018 CLINICAL DATA:  Chills and cough. EXAM: CHEST - 2 VIEW COMPARISON:  08/01/2018 FINDINGS: Low volumes. Nicole cardio pericardial silhouette is enlarged. Pulmonary vascular congestion noted with interstitial pulmonary edema pattern. No substantial pleural effusion. Nicole visualized bony structures of Nicole thorax are intact. Degenerative changes noted left shoulder. IMPRESSION: Cardiomegaly with interstitial pulmonary edema. Electronically Signed   By: Misty Stanley M.D.   On: 09/01/2018 16:12   Dg Chest Port 1 View  Result Date: 09/03/2018 CLINICAL DATA:  Shortness of breath and wheezing EXAM: PORTABLE CHEST 1 VIEW COMPARISON:  Two days ago FINDINGS: Cardiomegaly with perihilar vascular congestion. Lung volumes are low. No effusion, Kerley lines, or pneumothorax. IMPRESSION: Cardiomegaly and vascular congestion with possible perihilar edema. Electronically Signed   By: Monte Fantasia M.D.   On: 09/03/2018 04:14   Vas [place] Upper Extremity Venous Duplex  Result Date: 09/04/2018 UPPER VENOUS STUDY  Indications: Edema Left forearm and arm Limitations: Body habitus and forearm edema. Comparison Study: No previous study available for comparison Performing Technologist: Toma Copier RVS  Examination Guidelines: A  complete evaluation includes B-mode imaging, spectral Doppler, color Doppler, and power Doppler as needed of all accessible portions of each vessel. Bilateral testing is considered an integral part of a complete examination. Limited examinations for reoccurring indications may be performed as noted.  Right Findings: +----------+------------+---------+-----------+----------+-------+  RIGHT      Compressible Phasicity Spontaneous Properties Summary  +----------+------------+---------+-----------+----------+-------+  Subclavian     Full        Yes        Yes                         +----------+------------+---------+-----------+----------+-------+  Left Findings: +----------+------------+---------+-----------+----------+---------------------+  LEFT       Compressible Phasicity Spontaneous Properties        Summary         +----------+------------+---------+-----------+----------+---------------------+  IJV            Full        Yes        Yes                                       +----------+------------+---------+-----------+----------+---------------------+  Subclavian     Full        Yes        Yes                                       +----------+------------+---------+-----------+----------+---------------------+  Axillary       Full        Yes        Yes                                       +----------+------------+---------+-----------+----------+---------------------+  Brachial       Full        Yes        Yes                                       +----------+------------+---------+-----------+----------+---------------------+  Radial         Full                                                             +----------+------------+---------+-----------+----------+---------------------+  Ulnar          Full                                                             +----------+------------+---------+-----------+----------+---------------------+  Cephalic     Partial                                     Acute  in [facility] into                                                               Nicole mid upper arm    +----------+------------+---------+-----------+----------+---------------------+  Basilic  Unable to visualize                                                              Nicole forearm due to                                                              edema. Nicole upper arm                                                                  is negative       +----------+------------+---------+-----------+----------+---------------------+  Summary:  Right: No evidence of thrombosis in Nicole subclavian.  Left: No evidence of deep vein thrombosis in Nicole upper extremity. No evidence of thrombosis in Nicole subclavian. Findings consistent with acute superficial vein thrombosis involving Nicole left cephalic vein.  *See table(s) above for measurements and observations.  Diagnosing physician: Deitra Mayo MD Electronically signed by Deitra Mayo MD on 09/04/2018 at 1:22:51 PM.    Final     Assessment and Plan:   Acute on Chronic Diastolic CHF - Patient presents with worsening shortness of breath. - Chest x-ray showed cardiomegaly with vascular congestion and possible perihilar edema. - BNP elevated at 696 on admission (up from 512 a few days prior). - Most recent Echo from 06/2018 showed LVEF of >65% with normal wall motion, mild LVH, grade 2 diastolic dysfunction, and elevated left atrial and left ventricular end-diastolic pressures. RV noted to be mildly enlarged with normal systolic function but severely elevated RVSP of 79.2 consistent with severe pulmonary hypertension. - Patient started on IV Lasix 40mg  twice daily. Documented urinary output of 1.4 L in Nicole past 24 hours and net negative 2.5 L since admission. Weight up from 187 lbs on admission to 192 lbs today. Renal function stable. - Will increase IV Lasix to 80mg  twice daily. Will need to monitor renal  function closely. - Suspect shortness of breath is multifactorial due to COPD and asthma with chronic respiratory failure, severe pulmonary hypertension, chronic diastolic CHF, and deconditioning. - Continue to monitor daily weights, strict I/O's, and renal function.  Severe Pulmonary Hypertension - Likely due to COPD as LV function appears normal. - RVSP of 79.2 mmHg on Echo in 06/2018. - Management per primary team. - Patient previously followed by Dr. Halford Chessman (Pulmonary) as outpatient but does not look like she has been seen in over a year.   Elevated Troponin - High-sensivity troponin minimally elevate and flat at 75 >> 72. Not consistent with ACS. - EKG does show T wave inversion in anterior leads. These were seen on prior tracing in 07/2018 but were new at that time. - Patient denies any angina. - Likely demand ischemia in Nicole setting of acute respiratory failure and CHF. Although patient does have multiple cardiovascular risk factors including HTN, HLD, DM, age.  COPD with Chronic Respiratory Failure  on Home O2 - Management per primary team.  Hypertension - BP mildly elevated this morning at 155/66. - Continue home Amlodipine 10mg  daily. - Continue diuresis as above.  Hyperlipidemia - Patient reportedly has a [name] of hyperlipidemia but does not appear to be on any medications at home and no recent lipid panel in our system. Will check fasting lipid panel.  Type 2 Diabetes Mellitus - Patient on insulin at home. - Management per primary team.  CKD Stage IV - Serum creatinine 2.52 on admission. Baseline appears to be around 2.3 to 2.9. - Creatinine stable at 2.41 today. - Continue to avoid nephrotoxic medications. - Continue to monitor closely with diuresis.  Hyperkalemia - Potassium 5.9 on admission. - Improved with diuresis and 4.4 this morning. - Continue to monitor closely.  Chronic Anemia - Hemoglobin 9.1 on admission. Baseline anywhere from 7 to 9 range. -  Hemoglobin stable at 8.7 today. - Management per primary team.   For questions or updates, please contact [place] Please consult [email or web address] for contact info under     Signed, Darreld Mclean, PA-C  09/05/2018 7:24 AM

## 2018-09-05 NOTE — Plan of Care (Signed)

## 2018-09-06 DIAGNOSIS — J449 Chronic obstructive pulmonary disease, unspecified: Secondary | ICD-10-CM

## 2018-09-06 LAB — GLUCOSE, CAPILLARY
Glucose-Capillary: 98 mg/dL (ref 70–99)
Glucose-Capillary: 101 mg/dL — ABNORMAL HIGH (ref 70–99)
Glucose-Capillary: 173 mg/dL — ABNORMAL HIGH (ref 70–99)
Glucose-Capillary: 180 mg/dL — ABNORMAL HIGH (ref 70–99)
Glucose-Capillary: 155 mg/dL — ABNORMAL HIGH (ref 70–99)
Glucose-Capillary: 160 mg/dL — ABNORMAL HIGH (ref 70–99)

## 2018-09-06 LAB — LIPID PANEL
Cholesterol: 111 mg/dL (ref 0–200)
HDL: 36 mg/dL — ABNORMAL LOW (ref >40)
LDL Cholesterol: 57 mg/dL (ref 0–99)
Total CHOL/HDL Ratio: 3.1 RATIO
Triglycerides: 91 mg/dL (ref <150)
VLDL: 18 mg/dL (ref 0–40)

## 2018-09-06 LAB — CULTURE, BLOOD (ROUTINE X 2)
Culture: NO GROWTH
Culture: NO GROWTH

## 2018-09-06 LAB — BASIC METABOLIC PANEL
Anion gap: 14 (ref 5–15)
BUN: 95 mg/dL — ABNORMAL HIGH (ref 8–23)
CO2: 23 mmol/L (ref 22–32)
Calcium: 8.4 mg/dL — ABNORMAL LOW (ref 8.9–10.3)
Chloride: 102 mmol/L (ref 98–111)
Creatinine, Ser: 2.59 mg/dL — ABNORMAL HIGH (ref 0.44–1.00)
GFR calc Af Amer: 20 mL/min — ABNORMAL LOW (ref >60)
GFR calc non Af Amer: 17 mL/min — ABNORMAL LOW (ref >60)
Glucose, Bld: 218 mg/dL — ABNORMAL HIGH (ref 70–99)
Potassium: 4.7 mmol/L (ref 3.5–5.1)
Sodium: 139 mmol/L (ref 135–145)

## 2018-09-06 MED ORDER — HYDRALAZINE HCL 10 MG PO TABS
10.0000 mg | ORAL_TABLET | Freq: Three times a day (TID) | ORAL | Status: DC
  Administered 2018-09-06 – 2018-09-08 (×6): 10 mg via ORAL
  Filled 2018-09-06 (×6): qty 1

## 2018-09-06 NOTE — Plan of Care (Signed)
  Problem: Education: Goal: Ability to demonstrate management of disease process will improve 09/06/2018 1644 by Zadie Rhine, RN Outcome: Progressing 09/06/2018 1643 by Zadie Rhine, RN Outcome: Progressing Goal: Ability to verbalize understanding of medication therapies will improve 09/06/2018 1644 by Zadie Rhine, RN Outcome: Progressing 09/06/2018 1643 by Zadie Rhine, RN Outcome: Progressing Goal: Individualized Educational Video(s) Outcome: Progressing   Problem: Activity: Goal: Capacity to carry out activities will improve Outcome: Progressing   Problem: Cardiac: Goal: Ability to achieve and maintain adequate cardiopulmonary perfusion will improve Outcome: Progressing   Problem: Education: Goal: Knowledge of General Education information will improve Description: Including pain rating scale, medication(s)/side effects and non-pharmacologic comfort measures Outcome: Progressing   Problem: Health Behavior/Discharge Planning: Goal: Ability to manage health-related needs will improve Outcome: Progressing   Problem: Clinical Measurements: Goal: Ability to maintain clinical measurements within normal limits will improve Outcome: Progressing Goal: Will remain free from infection Outcome: Progressing Goal: Diagnostic test results will improve Outcome: Progressing Goal: Respiratory complications will improve Outcome: Progressing Goal: Cardiovascular complication will be avoided Outcome: Progressing   Problem: Activity: Goal: Risk for activity intolerance will decrease Outcome: Progressing   Problem: Nutrition: Goal: Adequate nutrition will be maintained Outcome: Progressing   Problem: Coping: Goal: Level of anxiety will decrease Outcome: Progressing   Problem: Elimination: Goal: Will not experience complications related to bowel motility Outcome: Progressing Goal: Will not experience complications related to urinary retention Outcome:  Progressing   Problem: Pain Managment: Goal: General experience of comfort will improve Outcome: Progressing   Problem: Safety: Goal: Ability to remain free from injury will improve Outcome: Progressing   Problem: Skin Integrity: Goal: Risk for impaired skin integrity will decrease Outcome: Progressing

## 2018-09-06 NOTE — Plan of Care (Signed)
  Problem: Education: Goal: Ability to demonstrate management of disease process will improve 09/06/2018 1646 by Zadie Rhine, RN Outcome: Progressing 09/06/2018 1644 by Zadie Rhine, RN Outcome: Progressing 09/06/2018 1643 by Zadie Rhine, RN Outcome: Progressing Goal: Ability to verbalize understanding of medication therapies will improve 09/06/2018 1646 by Zadie Rhine, RN Outcome: Progressing 09/06/2018 1644 by Zadie Rhine, RN Outcome: Progressing 09/06/2018 1643 by Zadie Rhine, RN Outcome: Progressing Goal: Individualized Educational Video(s) 09/06/2018 1646 by Zadie Rhine, RN Outcome: Progressing 09/06/2018 1643 by Zadie Rhine, RN Outcome: Progressing   Problem: Activity: Goal: Capacity to carry out activities will improve Outcome: Progressing   Problem: Cardiac: Goal: Ability to achieve and maintain adequate cardiopulmonary perfusion will improve Outcome: Progressing   Problem: Education: Goal: Knowledge of General Education information will improve Description: Including pain rating scale, medication(s)/side effects and non-pharmacologic comfort measures Outcome: Progressing   Problem: Health Behavior/Discharge Planning: Goal: Ability to manage health-related needs will improve Outcome: Progressing   Problem: Clinical Measurements: Goal: Ability to maintain clinical measurements within normal limits will improve Outcome: Progressing Goal: Will remain free from infection Outcome: Progressing Goal: Diagnostic test results will improve Outcome: Progressing Goal: Respiratory complications will improve Outcome: Progressing Goal: Cardiovascular complication will be avoided Outcome: Progressing   Problem: Activity: Goal: Risk for activity intolerance will decrease Outcome: Progressing   Problem: Nutrition: Goal: Adequate nutrition will be maintained Outcome: Progressing   Problem: Coping: Goal: Level of anxiety will  decrease Outcome: Progressing   Problem: Elimination: Goal: Will not experience complications related to bowel motility Outcome: Progressing Goal: Will not experience complications related to urinary retention Outcome: Progressing   Problem: Pain Managment: Goal: General experience of comfort will improve Outcome: Progressing   Problem: Safety: Goal: Ability to remain free from injury will improve Outcome: Progressing   Problem: Skin Integrity: Goal: Risk for impaired skin integrity will decrease Outcome: Progressing

## 2018-09-06 NOTE — Progress Notes (Signed)
MEW Yellow, pt stable without acute changes. SRP, RN

## 2018-09-06 NOTE — Progress Notes (Signed)
[facility]                                                                              Patient Demographics  [name], is a 78 y.o. female, DOB - 1940/11/28, CWU:[id number]  Admit date - 09/03/2018   Admitting Physician Mir Marry Guan, MD  Outpatient Primary MD for the patient is Garwin Brothers, MD  Outpatient specialists:   LOS - 3  days   Medical records reviewed and are as summarized below:    Chief Complaint  Patient presents with  . Shortness of Breath       Brief summary   Patient is a [name] with combined systolic and diastolic CHF (recent 2D echo with normal EF), type 2 diabetes mellitus, COPD with chronic respiratory failure on nocturnal home O2, anemia, obstructive sleep apnea history of peptic ulcer disease, hypertension and hyperlipidemia presented with increased breathing for past few days.  She was evaluated at [facility], ED today prior to admission for shortness of breath and discharged back.  She was noted to be hypoxic with O2 sat of 88% on 4 L nasal cannula.  EMS noted patient had wheezing and rales and treated with epinephrine and nitroglycerin. She was found tachypneic and hypoxic with acute on chronic combined systolic and diastolic CHF with chest x-ray showing pulmonary vascular congestion.  Also noted for acute on chronic kidney disease and hyperkalemia.  Patient was given Lasix 80 mg IV x1 and admitted for further work-up. COVID-19 test negative  Assessment & Plan    Principal Problem: Acute on chronic respiratory failure with hypoxia  acute on chronic combined systolic and diastolic CHF (congestive heart failure), NYHA class IV, ([place]) -Patient presented with hypoxia, bilateral rails, chest x-ray with pulmonary vascular congestion, also with acute on chronic CKD -Continue IV Lasix 80 mg IV twice daily, appreciate cardiology recommendations.  On torsemide at home.   - Patient is not on aspirin,  beta-blocker and ACE inhibitor.  Continue amlodipine. - 2D echo 6/9 showed EF > 65%, normal right ventricular systolic function. -Concern of possible cardiorenal syndrome, creatinine slightly up to 2.5 today, likely due to diuresis  Active Problems: Acute kidney injury on CKD stage IV -Suspect cardiorenal syndrome with acute CHF, decreased renal perfusion -Creatinine improving on 8/20 5-2.3 now trending up likely secondary to diuresis -Follows [facility] outpatient  Hyperkalemia -Resolved   COPD, underlying chronic respiratory failure on 2 L chronic O2 at night -Currently stable, no exacerbation. -Continue O2 2 L, home inhalers  Diabetes mellitus type 2, uncontrolled with CKD -CBGs stable, continue Lantus, sliding scale insulin   Elevated troponin -Likely due to demand ischemia with CHF, CKD, EKG unremarkable   Pressure injury wounds, POA -Bilateral heel pressure injuries, stage III on right,  -Unstageable left foot, full-thickness tissue loss, stage II left hip -Wound care following, continue nursing care -Continue nutritional recommendation for wound healing  Code Status: Full code DVT Prophylaxis:  Lovenox Family Communication: Discussed all imaging results, lab results, explained to the patient    Disposition Plan: PT evaluation recommended skilled nursing facility, return to SNF once on oral  Lasix and cardiology signed off.    Time Spent in minutes 35 minutes  Procedures:  None  Consultants:   Cardiology  Antimicrobials:   Anti-infectives (From admission, onward)   None         Medications  Scheduled Meds: . amLODipine  10 mg Oral Daily  . chlorhexidine  15 mL Mouth Rinse BID  . Chlorhexidine Gluconate Cloth  6 each Topical Q0600  . docusate sodium  100 mg Oral BID  . feeding supplement (ENSURE ENLIVE)  237 mL Oral BID BM  . feeding supplement (PRO-STAT SUGAR FREE 64)  30 mL Oral BID  . folic acid  0.5 mg Oral Daily  .  furosemide  80 mg Intravenous Q12H  . heparin  5,000 Units Subcutaneous Q8H  . hydrALAZINE  10 mg Oral Q8H  . insulin aspart  0-15 Units Subcutaneous TID WC  . insulin aspart  0-5 Units Subcutaneous QHS  . insulin detemir  10 Units Subcutaneous QHS  . iron polysaccharides  150 mg Oral BID  . mouth rinse  15 mL Mouth Rinse q12n4p  . montelukast  10 mg Oral QHS  . multivitamin  1 tablet Oral Daily  . mupirocin ointment  1 application Nasal BID  . omega-3 acid ethyl esters  1 g Oral Daily  . pantoprazole  40 mg Oral Q0600  . pyridOXINE  100 mg Oral Daily  . senna  1 tablet Oral BID  . simvastatin  20 mg Oral Daily  . traZODone  50 mg Oral QHS  . vitamin B-12  1,000 mcg Oral Daily  . vitamin E  400 Units Oral Daily   Continuous Infusions: PRN Meds:.acetaminophen **OR** acetaminophen, cyclobenzaprine, ipratropium-albuterol, oxyCODONE-acetaminophen      Subjective:   [name] was seen and examined today.  States breathing is a little better today, does not appear too much struggling like yesterday.  No chest pain, no fevers or chills.  BP somewhat elevated.    Patient denies abdominal pain, N/V/D/C, new weakness, numbess, tingling.  Objective:   Vitals:   09/05/18 1439 09/05/18 1652 09/05/18 2158 09/06/18 0651  BP: (!) 154/58  (!) 150/68 (!) 184/68  Pulse: 61  66 74  Resp: (!) 28  20 20   Temp: 97.8 F (36.6 C)  97.8 F (36.6 C) (!) 97.5 F (36.4 C)  TempSrc: Oral  Oral Oral  SpO2: 96%  98% 93%  Weight:      Height:  5\' 5"  (1.651 m)      Intake/Output Summary (Last 24 hours) at 09/06/2018 1300 Last data filed at 09/06/2018 0658 Gross per 24 hour  Intake -  Output 900 ml  Net -900 ml     Wt Readings from Last 3 Encounters:  09/05/18 87.4 kg  08/02/18 81.6 kg  07/10/18 72 kg   Physical Exam  General: Alert and oriented x 3, NAD  Eyes:   HEENT:  Atraumatic, normocephalic  Cardiovascular: S1 S2 clear, RRR. No pedal edema b/l  Respiratory: Decreased breath  sound at the bases  Gastrointestinal: Soft, nontender, nondistended, NBS  Ext: 1+ pedal edema bilaterally  Neuro: no new deficits  Musculoskeletal: No cyanosis, clubbing  Skin: Stage III chronic right heel ulcer  Psych: Normal affect and demeanor, alert and oriented x3     Data Reviewed:  I have personally reviewed following labs and imaging studies  Micro Results Recent Results (from the past 240 hour(s))  Blood culture (routine x 2)     Status: None  Collection Time: 09/01/18  3:59 PM   Specimen: BLOOD RIGHT HAND  Result Value Ref Range Status   Specimen Description BLOOD RIGHT HAND  Final   Special Requests   Final    BOTTLES DRAWN AEROBIC AND ANAEROBIC Blood Culture results may not be optimal due to an inadequate volume of blood received in culture bottles   Culture   Final    NO GROWTH 5 DAYS Performed at [facility], [place] [address] [place], [place] [address]    Report Status 09/06/2018 FINAL  Final  Blood culture (routine x 2)     Status: None   Collection Time: 09/01/18  4:06 PM   Specimen: BLOOD LEFT FOREARM  Result Value Ref Range Status   Specimen Description BLOOD LEFT FOREARM  Final   Special Requests   Final    BOTTLES DRAWN AEROBIC AND ANAEROBIC Blood Culture results may not be optimal due to an inadequate volume of blood received in culture bottles   Culture   Final    NO GROWTH 5 DAYS Performed at [facility], [place] [address] [place], [place] [address]    Report Status 09/06/2018 FINAL  Final  SARS CORONAVIRUS 2 Nasal Swab Aptima Multi Swab     Status: None   Collection Time: 09/03/18  6:33 AM   Specimen: Aptima Multi Swab; Nasal Swab  Result Value Ref Range Status   SARS Coronavirus 2 NEGATIVE NEGATIVE Final    Comment: (NOTE) SARS-CoV-2 target nucleic acids are NOT DETECTED. The SARS-CoV-2 RNA is generally detectable in upper and lower respiratory specimens during the acute phase of infection. Negative results do not preclude  SARS-CoV-2 infection, do not rule out co-infections with other pathogens, and should not be used as the sole basis for treatment or other patient management decisions. Negative results must be combined with clinical observations, patient history, and epidemiological information. The expected result is Negative. Fact Sheet for Patients: SugarRoll.be Fact Sheet for Healthcare Providers: https://www.woods-mathews.com/ This test is not yet approved or cleared by the [place] FDA and  has been authorized for detection and/or diagnosis of SARS-CoV-2 by FDA under an Emergency Use Authorization (EUA). This EUA will remain  in effect (meaning this test can be used) for the duration of the COVID-19 declaration under Section 56 4(b)(1) of the Act, 21 U.S.C. section 360bbb-3(b)(1), unless the authorization is terminated or revoked sooner. Performed at [facility], [place] [address] [place], [place] [address]   MRSA PCR Screening     Status: Abnormal   Collection Time: 09/03/18  8:37 PM   Specimen: Nasal Mucosa; Nasopharyngeal  Result Value Ref Range Status   MRSA by PCR POSITIVE (A) NEGATIVE Final    Comment:        The GeneXpert MRSA Assay (FDA approved for NASAL specimens only), is one component of a comprehensive MRSA colonization surveillance program. It is not intended to diagnose MRSA infection nor to guide or monitor treatment for MRSA infections. RESULT CALLED TO, READ BACK BY AND VERIFIED WITH: STEFFENS,M @ 0447 ON [id number] BY POTEAT,S Performed at [place] [address] [place], [place] [address]     Radiology Reports Dg Chest 2 View  Result Date: 09/01/2018 CLINICAL DATA:  Chills and cough. EXAM: CHEST - 2 VIEW COMPARISON:  08/01/2018 FINDINGS: Low volumes. The cardio pericardial silhouette is enlarged. Pulmonary vascular congestion noted with interstitial pulmonary edema pattern. No substantial  pleural effusion. The visualized bony structures of the thorax are  intact. Degenerative changes noted left shoulder. IMPRESSION: Cardiomegaly with interstitial pulmonary edema. Electronically Signed   By: Misty Stanley M.D.   On: 09/01/2018 16:12   Dg Chest Port 1 View  Result Date: 09/03/2018 CLINICAL DATA:  Shortness of breath and wheezing EXAM: PORTABLE CHEST 1 VIEW COMPARISON:  Two days ago FINDINGS: Cardiomegaly with perihilar vascular congestion. Lung volumes are low. No effusion, Kerley lines, or pneumothorax. IMPRESSION: Cardiomegaly and vascular congestion with possible perihilar edema. Electronically Signed   By: Monte Fantasia M.D.   On: 09/03/2018 04:14   Vas [place] Upper Extremity Venous Duplex  Result Date: 09/04/2018 UPPER VENOUS STUDY  Indications: Edema Left forearm and arm Limitations: Body habitus and forearm edema. Comparison Study: No previous study available for comparison Performing Technologist: Toma Copier RVS  Examination Guidelines: A complete evaluation includes B-mode imaging, spectral Doppler, color Doppler, and power Doppler as needed of all accessible portions of each vessel. Bilateral testing is considered an integral part of a complete examination. Limited examinations for reoccurring indications may be performed as noted.  Right Findings: +----------+------------+---------+-----------+----------+-------+ RIGHT     CompressiblePhasicitySpontaneousPropertiesSummary +----------+------------+---------+-----------+----------+-------+ Subclavian    Full       Yes       Yes                      +----------+------------+---------+-----------+----------+-------+  Left Findings: +----------+------------+---------+-----------+----------+---------------------+ LEFT      CompressiblePhasicitySpontaneousProperties       Summary        +----------+------------+---------+-----------+----------+---------------------+ IJV           Full       Yes       Yes                                     +----------+------------+---------+-----------+----------+---------------------+ Subclavian    Full       Yes       Yes                                    +----------+------------+---------+-----------+----------+---------------------+ Axillary      Full       Yes       Yes                                    +----------+------------+---------+-----------+----------+---------------------+ Brachial      Full       Yes       Yes                                    +----------+------------+---------+-----------+----------+---------------------+ Radial        Full                                                        +----------+------------+---------+-----------+----------+---------------------+ Ulnar         Full                                                        +----------+------------+---------+-----------+----------+---------------------+  Cephalic    Partial                                 Acute in the [facility] into                                                        the mid upper arm   +----------+------------+---------+-----------+----------+---------------------+ Basilic                                              Unable to visualize                                                       the forearm due to                                                       edema. The upper arm                                                           is negative      +----------+------------+---------+-----------+----------+---------------------+  Summary:  Right: No evidence of thrombosis in the subclavian.  Left: No evidence of deep vein thrombosis in the upper extremity. No evidence of thrombosis in the subclavian. Findings consistent with acute superficial vein thrombosis involving the left cephalic vein.  *See table(s) above for measurements and observations.  Diagnosing physician: Deitra Mayo MD  Electronically signed by Deitra Mayo MD on 09/04/2018 at 1:22:51 PM.    Final     Lab Data:  CBC: Recent Labs  Lab 09/01/18 1538 09/03/18 0429 09/04/18 0449  WBC 8.1 7.7 7.6  NEUTROABS 6.3 5.6  --   HGB 8.6* 9.1* 8.7*  HCT 29.3* 30.3* 29.7*  MCV 99.0 98.4 99.3  PLT 216 205 315   Basic Metabolic Panel: Recent Labs  Lab 09/01/18 1538 09/03/18 0345 09/04/18 0449 09/05/18 0448 09/06/18 0359  NA 142 143 142 139 139  K 5.5* 5.9* 4.8 4.4 4.7  CL 107 109 106 103 102  CO2 25 26 29 28 23   GLUCOSE 187* 142* 143* 213* 218*  BUN 95* 94* 92* 91* 95*  CREATININE 2.74* 2.52* 2.33* 2.41* 2.59*  CALCIUM 8.4* 8.4* 8.6* 8.5* 8.4*   GFR: Estimated Creatinine Clearance: 20.2 mL/min (A) (by C-G formula based on SCr of 2.59 mg/dL (H)). Liver Function Tests: Recent Labs  Lab 09/01/18 1538  AST 19  ALT 27  ALKPHOS 58  BILITOT 0.8  PROT 7.3  ALBUMIN 3.1*   No results for input(s): LIPASE, AMYLASE in the last 168 hours. No results for input(s): AMMONIA in the last 168  hours. Coagulation Profile: No results for input(s): INR, PROTIME in the last 168 hours. Cardiac Enzymes: No results for input(s): CKTOTAL, CKMB, CKMBINDEX, TROPONINI in the last 168 hours. BNP (last 3 results) No results for input(s): PROBNP in the last 8760 hours. HbA1C: No results for input(s): HGBA1C in the last 72 hours. CBG: Recent Labs  Lab 09/05/18 1224 09/05/18 1711 09/05/18 2246 09/06/18 0755 09/06/18 1138  GLUCAP 127* 129* 171* 173* 180*   Lipid Profile: Recent Labs    09/06/18 0359  CHOL 111  HDL 36*  LDLCALC 57  TRIG 91  CHOLHDL 3.1   Thyroid Function Tests: No results for input(s): TSH, T4TOTAL, FREET4, T3FREE, THYROIDAB in the last 72 hours. Anemia Panel: No results for input(s): VITAMINB12, FOLATE, FERRITIN, TIBC, IRON, RETICCTPCT in the last 72 hours. Urine analysis:    Component Value Date/Time   COLORURINE STRAW (A) 07/23/2018 1656   APPEARANCEUR CLEAR 07/23/2018 1656    LABSPEC 1.011 07/23/2018 1656   PHURINE 5.0 07/23/2018 1656   GLUCOSEU NEGATIVE 07/23/2018 1656   HGBUR NEGATIVE 07/23/2018 1656   BILIRUBINUR NEGATIVE 07/23/2018 1656   KETONESUR NEGATIVE 07/23/2018 1656   PROTEINUR NEGATIVE 07/23/2018 1656   UROBILINOGEN 0.2 12/16/2011 1717   NITRITE NEGATIVE 07/23/2018 1656   LEUKOCYTESUR NEGATIVE 07/23/2018 1656     Taige Housman M.D. [facility] 09/06/2018, 1:00 PM  Pager: [phone] Between 7am to 7pm - call Pager - [phone]  After 7pm go to [email or web address] - password [email or web address]  Call night coverage person covering after 7pm

## 2018-09-06 NOTE — Plan of Care (Signed)

## 2018-09-06 NOTE — Progress Notes (Signed)
Progress Note  Patient Name: [name] [facility] Date of Encounter: 09/06/2018  Primary Cardiologist: New--Dr. Harrell Gave if no prior (reports possibly outside cardiologist but cannot find information/she isn't sure)  Subjective   No new concerns today. She states that she thinks her breathing is near her baseline, though she is tachypneic with even a few words of conversation today. She is asking why this keeps happening. Discussed both diastolic dysfunction and pulmonary hypertension, how they are related, what causes them, how we manage them.   Inpatient Medications    Scheduled Meds: . amLODipine  10 mg Oral Daily  . chlorhexidine  15 mL Mouth Rinse BID  . Chlorhexidine Gluconate Cloth  6 each Topical Q0600  . docusate sodium  100 mg Oral BID  . feeding supplement (ENSURE ENLIVE)  237 mL Oral BID BM  . feeding supplement (PRO-STAT SUGAR FREE 64)  30 mL Oral BID  . folic acid  0.5 mg Oral Daily  . furosemide  80 mg Intravenous Q12H  . heparin  5,000 Units Subcutaneous Q8H  . hydrALAZINE  10 mg Oral Q8H  . insulin aspart  0-15 Units Subcutaneous TID WC  . insulin aspart  0-5 Units Subcutaneous QHS  . insulin detemir  10 Units Subcutaneous QHS  . iron polysaccharides  150 mg Oral BID  . mouth rinse  15 mL Mouth Rinse q12n4p  . montelukast  10 mg Oral QHS  . multivitamin  1 tablet Oral Daily  . mupirocin ointment  1 application Nasal BID  . omega-3 acid ethyl esters  1 g Oral Daily  . pantoprazole  40 mg Oral Q0600  . pyridOXINE  100 mg Oral Daily  . senna  1 tablet Oral BID  . simvastatin  20 mg Oral Daily  . traZODone  50 mg Oral QHS  . vitamin B-12  1,000 mcg Oral Daily  . vitamin E  400 Units Oral Daily   Continuous Infusions:  PRN Meds: acetaminophen **OR** acetaminophen, cyclobenzaprine, ipratropium-albuterol, oxyCODONE-acetaminophen   Vital Signs    Vitals:   09/05/18 1439 09/05/18 1652 09/05/18 2158 09/06/18 0651  BP: (!) 154/58  (!) 150/68 (!) 184/68  Pulse:  61  66 74  Resp: (!) 28  20 20   Temp: 97.8 F (36.6 C)  97.8 F (36.6 C) (!) 97.5 F (36.4 C)  TempSrc: Oral  Oral Oral  SpO2: 96%  98% 93%  Weight:      Height:  5\' 5"  (1.651 m)      Intake/Output Summary (Last 24 hours) at 09/06/2018 1211 Last data filed at 09/06/2018 0658 Gross per 24 hour  Intake 240 ml  Output 900 ml  Net -660 ml   Last 3 Weights 09/05/2018 09/03/2018 08/02/2018  Weight (lbs) 192 lb 10.9 oz 187 lb 14.4 oz 180 lb  Weight (kg) 87.4 kg 85.231 kg 81.647 kg      Telemetry    SR/sinus bradycardia overnight - Personally Reviewed  ECG    No new since 8/25 - Personally Reviewed  Physical Exam   GEN: No acute distress.  Lying in bed, [place] in place. Is conversationally dyspneic Neck: JVD difficult to see today but appears mid neck Cardiac: RRR, no rubs, or gallops. 3/6 SM Respiratory: Diffusely decreased lung sounds, wheezing and crackles improved today GI: Soft, nontender, non-distended  MS: Bilateral edema improved, less tight today. R foot bandaged Neuro:  Nonfocal  Psych: Normal affect   Labs    High Sensitivity Troponin:   Recent Labs  Lab 09/03/18 0345 09/03/18 0600  TROPONINIHS 75* 72*      Chemistry Recent Labs  Lab 09/01/18 1538  09/04/18 0449 09/05/18 0448 09/06/18 0359  NA 142   < > 142 139 139  K 5.5*   < > 4.8 4.4 4.7  CL 107   < > 106 103 102  CO2 25   < > 29 28 23   GLUCOSE 187*   < > 143* 213* 218*  BUN 95*   < > 92* 91* 95*  CREATININE 2.74*   < > 2.33* 2.41* 2.59*  CALCIUM 8.4*   < > 8.6* 8.5* 8.4*  PROT 7.3  --   --   --   --   ALBUMIN 3.1*  --   --   --   --   AST 19  --   --   --   --   ALT 27  --   --   --   --   ALKPHOS 58  --   --   --   --   BILITOT 0.8  --   --   --   --   GFRNONAA 16*   < > 20* 19* 17*  GFRAA 19*   < > 23* 22* 20*  ANIONGAP 10   < > 7 8 14    < > = values in this interval not displayed.     Hematology Recent Labs  Lab 09/01/18 1538 09/03/18 0429 09/04/18 0449  WBC 8.1 7.7 7.6  RBC  2.96* 3.08* 2.99*  HGB 8.6* 9.1* 8.7*  HCT 29.3* 30.3* 29.7*  MCV 99.0 98.4 99.3  MCH 29.1 29.5 29.1  MCHC 29.4* 30.0 29.3*  RDW 18.3* 18.3* 17.8*  PLT 216 205 208    BNP Recent Labs  Lab 09/01/18 1538 09/03/18 0429  BNP 512.4* 696.0*     DDimer No results for input(s): DDIMER in the last 168 hours.   Radiology    No results found.  Cardiac Studies   Echocardiogram 06/19/2018: Impressions: 1. The left ventricle has hyperdynamic systolic function, with an ejection fraction of >65%. The cavity size was normal. There is mildly increased left ventricular wall thickness. Left ventricular diastolic Doppler parameters are consistent with  pseudonormalization. Elevated left atrial and left ventricular end-diastolic pressures No evidence of left ventricular regional wall motion abnormalities. 2. The right ventricle has normal systolic function. The cavity was mildly enlarged. There is no increase in right ventricular wall thickness. Right ventricular systolic pressure is severely elevated with an estimated pressure of 79.2 mmHg. 3. Left atrial size was severely dilated. 4. Right atrial size was moderately dilated. 5. Tricuspid valve regurgitation is mild-moderate. 6. The aortic valve is tricuspid. Severely thickening of the aortic valve. Severe calcifcation of the aortic valve. Aortic valve regurgitation is mild by color flow Doppler. Moderate stenosis of the aortic valve. AV Mean Grad: 25.0 mmHg. AV Area (VTI):  1.54 cm. AV Vmax: 365.50 cm/s 7. The inferior vena cava was dilated in size with >50% respiratory variability.  Patient Profile     78 y.o. 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 Consulted at the request of Dr. Tana Coast for evaluation and management of acute on chronic diastolic heart failure  Assessment & Plan    Acute  on Chronic Diastolic CHF - Patient presents with worsening shortness of breath, elevated BNP and congestion on  CXR - Chest x-ray showed cardiomegaly with vascular congestion and possible perihilar edema. - difficult to follow volume status. Net negative 3L, but weight was up yesterday. No weight today. Uncaptured urine charted. - Increase lasix dose yesterday to 80 mg IV BID, but unclear what her response to this has been given issues above with monitoring volume status. Exam appears slightly improved. Would continue current dose of lasix - Suspect shortness of breath is multifactorial due to COPD and asthma with chronic respiratory failure, severe pulmonary hypertension, chronic diastolic CHF, and deconditioning. - Continue to monitor daily weights, strict I/O's, and renal function.  Severe Pulmonary Hypertension - Likely due to COPD with contributing component of diastolic dysfunction - RVSP of 79.2 mmHg on Echo in 06/2018. - Patient previously followed by Dr. Halford Chessman (Pulmonary) as outpatient but does not look like she has been seen in over a year.   Elevated Troponin - High-sensivity troponin minimally elevate and flat at 75 >> 72. Not consistent with ACS. Denies chest pain - Likely demand ischemia in the setting of acute respiratory failure and CHF. Patient does have multiple cardiovascular risk factors including HTN, HLD, DM, age. Monitor symptoms  Hypertension - BP higher today at 184/68 - Continue home Amlodipine 10mg  daily. - Will add hydralazine 10 mg TID. As she is in a facility, she should be able to receive TID medications - No ACEi/ARB/ARNI/MRA given her chronic kidney disease - on loop diuretic - has been borderline bradycardia. If she develops some reflex tachycardia could consider cardioselective beta blocker.  Hyperlipidemia - Patient reportedly has a history of hyperlipidemia. Lipid panel shows good control with LDL 57, Tchol 11, TG 91. Is on 20 mg sinvastatin daily.  This doesn't appear on her ambulatory med list, so I am uncertain if this is new or old. 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

## 2018-09-06 NOTE — Care Management Important Message (Signed)
Important Message  Patient Details IM Letter given to Cookie McGibboney RN to present to the Patient Name: [name] MRN: [id number] Date of Birth: 13-Feb-1940   Medicare Important Message Given:  Yes     Kerin Salen 09/06/2018, 11:19 AM

## 2018-09-07 ENCOUNTER — Inpatient Hospital Stay (HOSPITAL_COMMUNITY): Payer: Medicare Other

## 2018-09-07 LAB — BASIC METABOLIC PANEL
Anion gap: 9 (ref 5–15)
BUN: 96 mg/dL — ABNORMAL HIGH (ref 8–23)
CO2: 29 mmol/L (ref 22–32)
Calcium: 8.8 mg/dL — ABNORMAL LOW (ref 8.9–10.3)
Chloride: 103 mmol/L (ref 98–111)
Creatinine, Ser: 2.32 mg/dL — ABNORMAL HIGH (ref 0.44–1.00)
GFR calc Af Amer: 23 mL/min — ABNORMAL LOW (ref >60)
GFR calc non Af Amer: 20 mL/min — ABNORMAL LOW (ref >60)
Glucose, Bld: 137 mg/dL — ABNORMAL HIGH (ref 70–99)
Potassium: 4.6 mmol/L (ref 3.5–5.1)
Sodium: 141 mmol/L (ref 135–145)

## 2018-09-07 LAB — CBC
HCT: 28.4 % — ABNORMAL LOW (ref 36.0–46.0)
Hemoglobin: 8.3 g/dL — ABNORMAL LOW (ref 12.0–15.0)
MCH: 28.5 pg (ref 26.0–34.0)
MCHC: 29.2 g/dL — ABNORMAL LOW (ref 30.0–36.0)
MCV: 97.6 fL (ref 80.0–100.0)
Platelets: 218 10*3/uL (ref 150–400)
RBC: 2.91 MIL/uL — ABNORMAL LOW (ref 3.87–5.11)
RDW: 17.4 % — ABNORMAL HIGH (ref 11.5–15.5)
WBC: 6.2 10*3/uL (ref 4.0–10.5)
nRBC: 0 % (ref 0.0–0.2)

## 2018-09-07 LAB — GLUCOSE, CAPILLARY
Glucose-Capillary: 109 mg/dL — ABNORMAL HIGH (ref 70–99)
Glucose-Capillary: 123 mg/dL — ABNORMAL HIGH (ref 70–99)
Glucose-Capillary: 199 mg/dL — ABNORMAL HIGH (ref 70–99)
Glucose-Capillary: 189 mg/dL — ABNORMAL HIGH (ref 70–99)

## 2018-09-07 MED ORDER — BUDESONIDE 0.25 MG/2ML IN SUSP
0.2500 mg | Freq: Two times a day (BID) | RESPIRATORY_TRACT | Status: DC
  Administered 2018-09-07 – 2018-09-12 (×10): 0.25 mg via RESPIRATORY_TRACT
  Filled 2018-09-07 (×10): qty 2

## 2018-09-07 MED ORDER — IPRATROPIUM-ALBUTEROL 0.5-2.5 (3) MG/3ML IN SOLN: 3.0000 mL | Freq: Four times a day (QID) | RESPIRATORY_TRACT | Status: DC

## 2018-09-07 MED ORDER — FUROSEMIDE 10 MG/ML IJ SOLN
40.0000 mg | Freq: Once | INTRAMUSCULAR | Status: AC
  Administered 2018-09-07: 40 mg via INTRAVENOUS
  Filled 2018-09-07: qty 4

## 2018-09-07 MED ORDER — IPRATROPIUM-ALBUTEROL 0.5-2.5 (3) MG/3ML IN SOLN
3.0000 mL | Freq: Three times a day (TID) | RESPIRATORY_TRACT | Status: DC
  Administered 2018-09-07 – 2018-09-21 (×40): 3 mL via RESPIRATORY_TRACT
  Filled 2018-09-07 (×40): qty 3

## 2018-09-07 NOTE — Progress Notes (Signed)
[facility]                                                                              Patient Demographics  [name], is a 78 y.o. female, DOB - Sep 12, 1940, YQI:[id number]  Admit date - 09/03/2018   Admitting Physician Mir Marry Guan, MD  Outpatient Primary MD for the patient is Garwin Brothers, MD  Outpatient specialists:   LOS - 4  days   Medical records reviewed and are as summarized below:    Chief Complaint  Patient presents with  . Shortness of Breath       Brief summary   Patient is a [name] with combined systolic and diastolic CHF (recent 2D echo with normal EF), type 2 diabetes mellitus, COPD with chronic respiratory failure on nocturnal home O2, anemia, obstructive sleep apnea history of peptic ulcer disease, hypertension and hyperlipidemia presented with increased breathing for past few days.  She was evaluated at [facility], ED today prior to admission for shortness of breath and discharged back.  She was noted to be hypoxic with O2 sat of 88% on 4 L nasal cannula.  EMS noted patient had wheezing and rales and treated with epinephrine and nitroglycerin. She was found tachypneic and hypoxic with acute on chronic combined systolic and diastolic CHF with chest x-ray showing pulmonary vascular congestion.  Also noted for acute on chronic kidney disease and hyperkalemia.  Patient was given Lasix 80 mg IV x1 and admitted for further work-up. COVID-19 test negative  Assessment & Plan    Principal Problem: Acute on chronic respiratory failure with hypoxia  acute on chronic combined systolic and diastolic CHF (congestive heart failure), NYHA class IV, ([place]) -Patient presented with hypoxia, bilateral rails, chest x-ray with pulmonary vascular congestion, also with acute on chronic CKD -Patient was placed on IV diuresis however continue to have hypoxia, shortness of breath.  Cardiology  consulted. On torsemide at home.   Increased Lasix to 80 mg IV every 12 hours. - Patient is not on aspirin, beta-blocker and ACE inhibitor.  Continue amlodipine. - 2D echo 6/9 showed EF > 65%, normal right ventricular systolic function. -Concern of possible cardiorenal syndrome, appears still dyspneic on conversation, hypoxic -Stat portable chest x-ray showed worsening pulmonary edema, will give extra dose of Lasix 40 mg IV x1.  Cardiology following.  Active Problems: Acute kidney injury on CKD stage IV -Suspect cardiorenal syndrome with acute CHF, decreased renal perfusion -Follows [facility] outpatient -Creatinine stable, 2.3, on high-dose IV diuresis  Hyperkalemia -Resolved   COPD, underlying chronic respiratory failure on 2 L chronic O2 at night, acute on chronic respiratory failure -Continue O2, wean as tolerated -Continue duo nebs, Singulair, add Pulmicort  Diabetes mellitus type 2, uncontrolled with CKD -CBGs stable, continue Lantus, sliding scale insulin  Elevated troponin -Likely due to demand ischemia with CHF, CKD, EKG unremarkable  Pressure injury wounds, POA -Bilateral heel pressure injuries, stage III on right,  -Unstageable left foot, full-thickness tissue loss, stage II left hip -Wound care following, continue nursing care -Continue nutritional recommendation for wound healing  Code Status: Full code DVT Prophylaxis:  Lovenox Family Communication: Discussed all imaging results, lab results, explained to the patient    Disposition Plan: PT evaluation recommended skilled nursing facility, return to SNF once on oral Lasix and cardiology signed off.    Time Spent in minutes 25 minutes  Procedures:  2D echo  Consultants:   Cardiology  Antimicrobials:   Anti-infectives (From admission, onward)   None         Medications  Scheduled Meds: . amLODipine  10 mg Oral Daily  . chlorhexidine  15 mL Mouth Rinse BID  . Chlorhexidine Gluconate Cloth  6 each Topical  Q0600  . docusate sodium  100 mg Oral BID  . feeding supplement (ENSURE ENLIVE)  237 mL Oral BID BM  . feeding supplement (PRO-STAT SUGAR FREE 64)  30 mL Oral BID  . folic acid  0.5 mg Oral Daily  . furosemide  80 mg Intravenous Q12H  . heparin  5,000 Units Subcutaneous Q8H  . hydrALAZINE  10 mg Oral Q8H  . insulin aspart  0-15 Units Subcutaneous TID WC  . insulin aspart  0-5 Units Subcutaneous QHS  . insulin detemir  10 Units Subcutaneous QHS  . iron polysaccharides  150 mg Oral BID  . mouth rinse  15 mL Mouth Rinse q12n4p  . montelukast  10 mg Oral QHS  . multivitamin  1 tablet Oral Daily  . mupirocin ointment  1 application Nasal BID  . omega-3 acid ethyl esters  1 g Oral Daily  . pantoprazole  40 mg Oral Q0600  . pyridOXINE  100 mg Oral Daily  . senna  1 tablet Oral BID  . simvastatin  20 mg Oral Daily  . traZODone  50 mg Oral QHS  . vitamin B-12  1,000 mcg Oral Daily  . vitamin E  400 Units Oral Daily   Continuous Infusions: PRN Meds:.acetaminophen **OR** acetaminophen, cyclobenzaprine, ipratropium-albuterol, oxyCODONE-acetaminophen      Subjective:   [name] was seen and examined today.  Still visibly dyspneic on conversing.  Hypoxia, no chest pain, fevers or chills.    Patient denies abdominal pain, N/V/D/C, new weakness, numbess, tingling.  Objective:   Vitals:   09/07/18 0410 09/07/18 1300 09/07/18 1314 09/07/18 1320  BP: (!) 181/69     Pulse: 75  85   Resp: (!) 25 (!) 24    Temp: 98.7 F (37.1 C)     TempSrc:      SpO2: 98% (!) 83% (!) 85% (!) 88%  Weight:      Height:        Intake/Output Summary (Last 24 hours) at 09/07/2018 1404 Last data filed at 09/07/2018 0430 Gross per 24 hour  Intake -  Output 150 ml  Net -150 ml     Wt Readings from Last 3 Encounters:  09/05/18 87.4 kg  08/02/18 81.6 kg  07/10/18 72 kg   Physical Exam  General: Alert and oriented x 3, NAD  Eyes:   HEENT:  Atraumatic, normocephalic  Cardiovascular: S1 S2  clear, RRR.  1+ pedal edema b/l  Respiratory: Bibasilar crackles  Gastrointestinal: Soft, nontender, nondistended, NBS  Ext: 1+ pedal edema bilaterally  Neuro: no new deficits  Musculoskeletal: No cyanosis, clubbing  Skin: Stage III chronic right heel ulcer  Psych: Normal affect and demeanor, alert and oriented x3        Data Reviewed:  I have personally reviewed following labs and imaging studies  Micro Results Recent Results (from the past 240 hour(s))  Blood culture (routine x  2)     Status: None   Collection Time: 09/01/18  3:59 PM   Specimen: BLOOD RIGHT HAND  Result Value Ref Range Status   Specimen Description BLOOD RIGHT HAND  Final   Special Requests   Final    BOTTLES DRAWN AEROBIC AND ANAEROBIC Blood Culture results may not be optimal due to an inadequate volume of blood received in culture bottles   Culture   Final    NO GROWTH 5 DAYS Performed at [facility], [place] [address] [place], [place] [address]    Report Status 09/06/2018 FINAL  Final  Blood culture (routine x 2)     Status: None   Collection Time: 09/01/18  4:06 PM   Specimen: BLOOD LEFT FOREARM  Result Value Ref Range Status   Specimen Description BLOOD LEFT FOREARM  Final   Special Requests   Final    BOTTLES DRAWN AEROBIC AND ANAEROBIC Blood Culture results may not be optimal due to an inadequate volume of blood received in culture bottles   Culture   Final    NO GROWTH 5 DAYS Performed at [facility], [place] [address] [place], [place] [address]    Report Status 09/06/2018 FINAL  Final  SARS CORONAVIRUS 2 Nasal Swab Aptima Multi Swab     Status: None   Collection Time: 09/03/18  6:33 AM   Specimen: Aptima Multi Swab; Nasal Swab  Result Value Ref Range Status   SARS Coronavirus 2 NEGATIVE NEGATIVE Final    Comment: (NOTE) SARS-CoV-2 target nucleic acids are NOT DETECTED. The SARS-CoV-2 RNA is generally detectable in upper and lower respiratory specimens during the acute  phase of infection. Negative results do not preclude SARS-CoV-2 infection, do not rule out co-infections with other pathogens, and should not be used as the sole basis for treatment or other patient management decisions. Negative results must be combined with clinical observations, patient history, and epidemiological information. The expected result is Negative. Fact Sheet for Patients: SugarRoll.be Fact Sheet for Healthcare Providers: https://www.woods-mathews.com/ This test is not yet approved or cleared by the [place] FDA and  has been authorized for detection and/or diagnosis of SARS-CoV-2 by FDA under an Emergency Use Authorization (EUA). This EUA will remain  in effect (meaning this test can be used) for the duration of the COVID-19 declaration under Section 56 4(b)(1) of the Act, 21 U.S.C. section 360bbb-3(b)(1), unless the authorization is terminated or revoked sooner. Performed at [facility], [place] [address] [place], [place] [address]   MRSA PCR Screening     Status: Abnormal   Collection Time: 09/03/18  8:37 PM   Specimen: Nasal Mucosa; Nasopharyngeal  Result Value Ref Range Status   MRSA by PCR POSITIVE (A) NEGATIVE Final    Comment:        The GeneXpert MRSA Assay (FDA approved for NASAL specimens only), is one component of a comprehensive MRSA colonization surveillance program. It is not intended to diagnose MRSA infection nor to guide or monitor treatment for MRSA infections. RESULT CALLED TO, READ BACK BY AND VERIFIED WITH: STEFFENS,M @ 0447 ON [id number] BY POTEAT,S Performed at [place] [address] [place], [place] [address]     Radiology Reports Dg Chest 2 View  Result Date: 09/01/2018 CLINICAL DATA:  Chills and cough. EXAM: CHEST - 2 VIEW COMPARISON:  08/01/2018 FINDINGS: Low volumes. The cardio pericardial silhouette is enlarged. Pulmonary vascular congestion noted with  interstitial pulmonary edema pattern. No substantial pleural  effusion. The visualized bony structures of the thorax are intact. Degenerative changes noted left shoulder. IMPRESSION: Cardiomegaly with interstitial pulmonary edema. Electronically Signed   By: Misty Stanley M.D.   On: 09/01/2018 16:12   Dg Chest Port 1 View  Result Date: 09/07/2018 CLINICAL DATA:  Dyspnea. EXAM: PORTABLE CHEST 1 VIEW COMPARISON:  Chest x-ray dated September 03, 2018. FINDINGS: Unchanged cardiomegaly. Diffuse interstitial and hazy airspace opacities have mildly worsened. No pleural effusion or pneumothorax. No acute osseous abnormality. IMPRESSION: 1. Worsening pulmonary edema. Electronically Signed   By: Titus Dubin M.D.   On: 09/07/2018 13:51   Dg Chest Port 1 View  Result Date: 09/03/2018 CLINICAL DATA:  Shortness of breath and wheezing EXAM: PORTABLE CHEST 1 VIEW COMPARISON:  Two days ago FINDINGS: Cardiomegaly with perihilar vascular congestion. Lung volumes are low. No effusion, Kerley lines, or pneumothorax. IMPRESSION: Cardiomegaly and vascular congestion with possible perihilar edema. Electronically Signed   By: Monte Fantasia M.D.   On: 09/03/2018 04:14   Vas [place] Upper Extremity Venous Duplex  Result Date: 09/04/2018 UPPER VENOUS STUDY  Indications: Edema Left forearm and arm Limitations: Body habitus and forearm edema. Comparison Study: No previous study available for comparison Performing Technologist: Toma Copier RVS  Examination Guidelines: A complete evaluation includes B-mode imaging, spectral Doppler, color Doppler, and power Doppler as needed of all accessible portions of each vessel. Bilateral testing is considered an integral part of a complete examination. Limited examinations for reoccurring indications may be performed as noted.  Right Findings: +----------+------------+---------+-----------+----------+-------+ RIGHT     CompressiblePhasicitySpontaneousPropertiesSummary  +----------+------------+---------+-----------+----------+-------+ Subclavian    Full       Yes       Yes                      +----------+------------+---------+-----------+----------+-------+  Left Findings: +----------+------------+---------+-----------+----------+---------------------+ LEFT      CompressiblePhasicitySpontaneousProperties       Summary        +----------+------------+---------+-----------+----------+---------------------+ IJV           Full       Yes       Yes                                    +----------+------------+---------+-----------+----------+---------------------+ Subclavian    Full       Yes       Yes                                    +----------+------------+---------+-----------+----------+---------------------+ Axillary      Full       Yes       Yes                                    +----------+------------+---------+-----------+----------+---------------------+ Brachial      Full       Yes       Yes                                    +----------+------------+---------+-----------+----------+---------------------+ Radial        Full                                                        +----------+------------+---------+-----------+----------+---------------------+  Ulnar         Full                                                        +----------+------------+---------+-----------+----------+---------------------+ Cephalic    Partial                                 Acute in the [facility] into                                                        the mid upper arm   +----------+------------+---------+-----------+----------+---------------------+ Basilic                                              Unable to visualize                                                       the forearm due to                                                       edema. The upper arm                                                            is negative      +----------+------------+---------+-----------+----------+---------------------+  Summary:  Right: No evidence of thrombosis in the subclavian.  Left: No evidence of deep vein thrombosis in the upper extremity. No evidence of thrombosis in the subclavian. Findings consistent with acute superficial vein thrombosis involving the left cephalic vein.  *See table(s) above for measurements and observations.  Diagnosing physician: Deitra Mayo MD Electronically signed by Deitra Mayo MD on 09/04/2018 at 1:22:51 PM.    Final     Lab Data:  CBC: Recent Labs  Lab 09/01/18 1538 09/03/18 0429 09/04/18 0449 09/07/18 0338  WBC 8.1 7.7 7.6 6.2  NEUTROABS 6.3 5.6  --   --   HGB 8.6* 9.1* 8.7* 8.3*  HCT 29.3* 30.3* 29.7* 28.4*  MCV 99.0 98.4 99.3 97.6  PLT 216 205 208 409   Basic Metabolic Panel: Recent Labs  Lab 09/03/18 0345 09/04/18 0449 09/05/18 0448 09/06/18 0359 09/07/18 0338  NA 143 142 139 139 141  K 5.9* 4.8 4.4 4.7 4.6  CL 109 106 103 102 103  CO2 26 29 28 23 29   GLUCOSE 142* 143* 213* 218* 137*  BUN 94* 92* 91* 95* 96*  CREATININE 2.52* 2.33* 2.41* 2.59*  2.32*  CALCIUM 8.4* 8.6* 8.5* 8.4* 8.8*   GFR: Estimated Creatinine Clearance: 22.5 mL/min (A) (by C-G formula based on SCr of 2.32 mg/dL (H)). Liver Function Tests: Recent Labs  Lab 09/01/18 1538  AST 19  ALT 27  ALKPHOS 58  BILITOT 0.8  PROT 7.3  ALBUMIN 3.1*   No results for input(s): LIPASE, AMYLASE in the last 168 hours. No results for input(s): AMMONIA in the last 168 hours. Coagulation Profile: No results for input(s): INR, PROTIME in the last 168 hours. Cardiac Enzymes: No results for input(s): CKTOTAL, CKMB, CKMBINDEX, TROPONINI in the last 168 hours. BNP (last 3 results) No results for input(s): PROBNP in the last 8760 hours. HbA1C: No results for input(s): HGBA1C in the last 72 hours. CBG: Recent Labs  Lab 09/06/18 1138 09/06/18 1620  09/06/18 2125 09/07/18 0800 09/07/18 1114  GLUCAP 180* 155* 160* 109* 123*   Lipid Profile: Recent Labs    09/06/18 0359  CHOL 111  HDL 36*  LDLCALC 57  TRIG 91  CHOLHDL 3.1   Thyroid Function Tests: No results for input(s): TSH, T4TOTAL, FREET4, T3FREE, THYROIDAB in the last 72 hours. Anemia Panel: No results for input(s): VITAMINB12, FOLATE, FERRITIN, TIBC, IRON, RETICCTPCT in the last 72 hours. Urine analysis:    Component Value Date/Time   COLORURINE STRAW (A) 07/23/2018 1656   APPEARANCEUR CLEAR 07/23/2018 1656   LABSPEC 1.011 07/23/2018 1656   PHURINE 5.0 07/23/2018 1656   GLUCOSEU NEGATIVE 07/23/2018 1656   HGBUR NEGATIVE 07/23/2018 1656   BILIRUBINUR NEGATIVE 07/23/2018 1656   KETONESUR NEGATIVE 07/23/2018 1656   PROTEINUR NEGATIVE 07/23/2018 1656   UROBILINOGEN 0.2 12/16/2011 1717   NITRITE NEGATIVE 07/23/2018 1656   LEUKOCYTESUR NEGATIVE 07/23/2018 1656     Lutisha Knoche M.D. [facility] 09/07/2018, 2:04 PM  Pager: [phone] Between 7am to 7pm - call Pager - [phone]  After 7pm go to [email or web address] - password [email or web address]  Call night coverage person covering after 7pm

## 2018-09-07 NOTE — Progress Notes (Signed)
Progress Note  Patient Name: [name] [facility] Date of Encounter: 09/07/2018  Primary Cardiologist: New--Dr. Harrell Gave if no prior (reports possibly outside cardiologist but cannot find information/she isn't sure)  Subjective   She states that breathing may be mildly improved from yesterday although she is not able to tell me many specifics.  She slept okay last night except for when awoken by staff.  It is unclear where the patient is compared to her baseline.  Inpatient Medications    Scheduled Meds: . amLODipine  10 mg Oral Daily  . chlorhexidine  15 mL Mouth Rinse BID  . Chlorhexidine Gluconate Cloth  6 each Topical Q0600  . docusate sodium  100 mg Oral BID  . feeding supplement (ENSURE ENLIVE)  237 mL Oral BID BM  . feeding supplement (PRO-STAT SUGAR FREE 64)  30 mL Oral BID  . folic acid  0.5 mg Oral Daily  . furosemide  80 mg Intravenous Q12H  . heparin  5,000 Units Subcutaneous Q8H  . hydrALAZINE  10 mg Oral Q8H  . insulin aspart  0-15 Units Subcutaneous TID WC  . insulin aspart  0-5 Units Subcutaneous QHS  . insulin detemir  10 Units Subcutaneous QHS  . iron polysaccharides  150 mg Oral BID  . mouth rinse  15 mL Mouth Rinse q12n4p  . montelukast  10 mg Oral QHS  . multivitamin  1 tablet Oral Daily  . mupirocin ointment  1 application Nasal BID  . omega-3 acid ethyl esters  1 g Oral Daily  . pantoprazole  40 mg Oral Q0600  . pyridOXINE  100 mg Oral Daily  . senna  1 tablet Oral BID  . simvastatin  20 mg Oral Daily  . traZODone  50 mg Oral QHS  . vitamin B-12  1,000 mcg Oral Daily  . vitamin E  400 Units Oral Daily   Continuous Infusions:  PRN Meds: acetaminophen **OR** acetaminophen, cyclobenzaprine, ipratropium-albuterol, oxyCODONE-acetaminophen   Vital Signs    Vitals:   09/06/18 2131 09/06/18 2148 09/06/18 2235 09/07/18 0410  BP: (!) 202/79  (!) 173/73 (!) 181/69  Pulse: 66  68 75  Resp: (!) 26 (!) 22 (!) 22 (!) 25  Temp: 98.4 F (36.9 C)   98.7 F  (37.1 C)  TempSrc: Oral     SpO2: 100%  97% 98%  Weight:      Height:        Intake/Output Summary (Last 24 hours) at 09/07/2018 1028 Last data filed at 09/07/2018 0430 Gross per 24 hour  Intake -  Output 150 ml  Net -150 ml   Last 3 Weights 09/05/2018 09/03/2018 08/02/2018  Weight (lbs) 192 lb 10.9 oz 187 lb 14.4 oz 180 lb  Weight (kg) 87.4 kg 85.231 kg 81.647 kg      Telemetry    Not currently on telemetry  ECG    No new tracings- Personally Reviewed  Physical Exam   GEN:  Debilitated, elderly female.  No acute distress.   Neck:  Unable to assess JVD due to body habitus Cardiac: RRR, 2/6 systolic murmur in the apical region Respiratory: Clear to auscultation bilaterally with crackles in the posterior bases. GI: Soft, nontender, non-distended  MS:  Mild lower leg edema; deformities of both feet Neuro:   Patient is slow to process, not a good historian Psych: Normal affect   Labs    High Sensitivity Troponin:   Recent Labs  Lab 09/03/18 0345 09/03/18 0600  TROPONINIHS 75* 72*  Chemistry Recent Labs  Lab 09/01/18 1538  09/05/18 0448 09/06/18 0359 09/07/18 0338  NA 142   < > 139 139 141  K 5.5*   < > 4.4 4.7 4.6  CL 107   < > 103 102 103  CO2 25   < > 28 23 29   GLUCOSE 187*   < > 213* 218* 137*  BUN 95*   < > 91* 95* 96*  CREATININE 2.74*   < > 2.41* 2.59* 2.32*  CALCIUM 8.4*   < > 8.5* 8.4* 8.8*  PROT 7.3  --   --   --   --   ALBUMIN 3.1*  --   --   --   --   AST 19  --   --   --   --   ALT 27  --   --   --   --   ALKPHOS 58  --   --   --   --   BILITOT 0.8  --   --   --   --   GFRNONAA 16*   < > 19* 17* 20*  GFRAA 19*   < > 22* 20* 23*  ANIONGAP 10   < > 8 14 9    < > = values in this interval not displayed.     Hematology Recent Labs  Lab 09/03/18 0429 09/04/18 0449 09/07/18 0338  WBC 7.7 7.6 6.2  RBC 3.08* 2.99* 2.91*  HGB 9.1* 8.7* 8.3*  HCT 30.3* 29.7* 28.4*  MCV 98.4 99.3 97.6  MCH 29.5 29.1 28.5  MCHC 30.0 29.3* 29.2*  RDW  18.3* 17.8* 17.4*  PLT 205 208 218    BNP Recent Labs  Lab 09/01/18 1538 09/03/18 0429  BNP 512.4* 696.0*     DDimer No results for input(s): DDIMER in the last 168 hours.   Radiology    No results found.  Cardiac Studies   Echocardiogram 06/19/2018: Impressions: 1. The left ventricle has hyperdynamic systolic function, with an ejection fraction of >65%. The cavity size was normal. There is mildly increased left ventricular wall thickness. Left ventricular diastolic Doppler parameters are consistent with  pseudonormalization. Elevated left atrial and left ventricular end-diastolic pressures No evidence of left ventricular regional wall motion abnormalities. 2. The right ventricle has normal systolic function. The cavity was mildly enlarged. There is no increase in right ventricular wall thickness. Right ventricular systolic pressure is severely elevated with an estimated pressure of 79.2 mmHg. 3. Left atrial size was severely dilated. 4. Right atrial size was moderately dilated. 5. Tricuspid valve regurgitation is mild-moderate. 6. The aortic valve is tricuspid. Severely thickening of the aortic valve. Severe calcifcation of the aortic valve. Aortic valve regurgitation is mild by color flow Doppler. Moderate stenosis of the aortic valve. AV Mean Grad: 25.0 mmHg. AV Area (VTI):  1.54 cm. AV Vmax: 365.50 cm/s 7. The inferior vena cava was dilated in size with >50% respiratory variability.  Patient Profile     78 y.o. 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  Assessment & Plan    Acute on chronic diastolic heart failure Patient presented with worsening shortness of breath, elevated BNP and congestion on chest x-ray. -Shortness of breath suspected to be multifactorial with COPD and asthma with chronic respiratory failure,  severe pulmonary hypertension, chronic diastolic CHF and deconditioning. -Lasix was increased yesterday to 80 mg  IV twice daily -Patient reports possibly mild improvement from yesterday and her breathing, although she still appears mildly short of breath with talking. -I&O does not appear to be accurately documented.  No weights for the last 2 days. -I will order strict intake and output and daily weights. -Patient is not currently on telemetry, would advise monitoring on telemetry during treatment for heart failure with high intensity diuretics.  Heart rates as documented with vital signs appear to be stable in the 60s-70s.  CKD stage IV -Baseline creatinine around 2.5. -SCr on admission 2.52, improved somewhat to 2.32.  -Continue to monitor renal function closely with diuresis.  Severe pulmonary hypertension - Likely due to COPD with contributing component of diastolic dysfunction - RVSP of 79.2 mmHg on Echo in 06/2018. - Patient previously followed by Dr. Halford Chessman (Pulmonary) as outpatient but does not look like she has been seen in over a year.  Elevated troponin -High-sensitivity troponin minimally elevated and flat pattern.  Not consistent with ACS.  Denies chest pain. -Likely demand ischemia in the setting of acute respiratory failure and CHF.  Hypertension -Home amlodipine 10 mg daily continued.  Hydralazine 10 mg 3 times daily added yesterday.  Patient has now had 3 doses.  Blood pressure continues to be elevated.  Continue to monitor and may consider increasing dose. -No ACEi/ARB/ARNI/MRA given her chronic kidney disease -Is diuresing with Lasix.  Hyperlipidemia - Patient reportedly has a history of hyperlipidemia. Lipid panel shows good control with LDL 57, Tchol 11, TG 91. Is on 20 mg simvastatin daily, although not on her outpatient list.  -Continue current therapy. (Would not be able to increase simvastatin due to risk of interaction with amlodipine)    For questions or  updates, please contact [place] Please consult [email or web address] for contact info under        Signed, Daune Perch, NP  09/07/2018, 10:28 AM

## 2018-09-07 NOTE — Progress Notes (Signed)
I sent a texr to Dr Tana Coast to report respiratory assessment and  low O2 SATS. She is placing orders.

## 2018-09-07 NOTE — Progress Notes (Signed)
Physical Therapy Treatment Patient Details Name: [name] MRN: [id number] DOB: [birth year] Today's Date: 09/07/2018    History of Present Illness 78 yo female admitted from [place] with shortness of breath, CXR reveals pulmonary edema consistent with heart failure. 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    PT Comments    Pt motivated to participate in PT session. Pt transferred to drop arm recliner with min assist +2, and pt wants to do everything for self physically if possible.  Upon PT arrival to room, pt was on 5LO2 via [name] with sats in 82-84% range in supine with increased work of breathing. Pt with rattling quality to breath sounds. Pt placed on 6LO2 via [name] with some recovery to 87-90% with mobility, along with verbal cuing for breathing technique (in through nose, out through mouth). RN and PT agreed changing pt's position could improve breath quality and sats, so PT proceeded with transfer. Immediately post-transfer, pt with sats in low-mid 80s%, again recovering to 89-90% with cuing for breathing technique. Pt resting comfortably upon PT exit.   Follow Up Recommendations  SNF;Supervision/Assistance - 24 hour     Equipment Recommendations  Other (comment)(defer)    Recommendations for Other Services       Precautions / Restrictions Precautions Precaution Comments: SATS - pt on 5LO2 via [name] upon PT arrival to room, placed on 6LO2 to recover sats >88% Restrictions Weight Bearing Restrictions: No    Mobility  Bed Mobility Overal bed mobility: Needs Assistance Bed Mobility: Supine to Sit     Supine to sit: HOB elevated;Min assist;+2 for physical assistance     General bed mobility comments: Min assist for lowering LEs over EOB, trunk elevation and steadying upon sitting. Pt with posterior leaning in sitting occasionally, requiring min PT assist to  correct.  Transfers Overall transfer level: Needs assistance   Transfers: Lateral/Scoot Transfers          Lateral/Scoot Transfers: +2 safety/equipment;Min assist General transfer comment: Min assist for guiding pt's trunk with bed pad and trunk facilitation, LE positioning, and scooting towards recliner. Pt able to position self in recliner with use of UEs.  Ambulation/Gait Ambulation/Gait assistance: (NT - bilateral foot wrappings, not appropriate to stand)               Stairs             Wheelchair Mobility    Modified Rankin (Stroke Patients Only)       Balance Overall balance assessment: Needs assistance Sitting-balance support: Bilateral upper extremity supported;Feet supported Sitting balance-Leahy Scale: Fair Sitting balance - Comments: able to sit unsupported and scoot laterally to perform scoot pivot to recliner; pt sat EOB ~10 minutes for core strengthening, balance righting, and scoot pivot to recliner.   Standing balance support: (NT)                                Cognition Arousal/Alertness: Awake/alert Behavior During Therapy: WFL for tasks assessed/performed Overall Cognitive Status: Within Functional Limits for tasks assessed                                        Exercises      General Comments General comments (skin integrity, edema, etc.): Pt on 5LO2 via [place] upon PT  arrival to room, with sats in 82-84% in supine. Pt placed on 6LO2 via [name] with some recovery to 87-90% with mobility, along with verbal cuing for breathing technique (in through nose, out through mouth).      Pertinent Vitals/Pain Pain Assessment: No/denies pain    Home Living                      Prior Function            PT Goals (current goals can now be found in the care plan section) Acute Rehab PT Goals Patient Stated Goal: get stronger, walk PT Goal Formulation: With patient Time For Goal Achievement:  09/18/18 Potential to Achieve Goals: Good Progress towards PT goals: Progressing toward goals    Frequency    Min 2X/week      PT Plan Current plan remains appropriate    Co-evaluation              AM-PAC PT "6 Clicks" Mobility   Outcome Measure  Help needed turning from your back to your side while in a flat bed without using bedrails?: A Little Help needed moving from lying on your back to sitting on the side of a flat bed without using bedrails?: A Little Help needed moving to and from a bed to a chair (including a wheelchair)?: A Little Help needed standing up from a chair using your arms (e.g., wheelchair or bedside chair)?: Total Help needed to walk in hospital room?: Total Help needed climbing 3-5 steps with a railing? : Total 6 Click Score: 12    End of Session Equipment Utilized During Treatment: Gait belt;Oxygen Activity Tolerance: Patient tolerated treatment well;Patient limited by fatigue Patient left: in chair;with chair alarm set;with call bell/phone within reach;with nursing/sitter in room(with lift pad under pt for return to bed) Nurse Communication: Mobility status;Other (comment)(desaturation, rattling quality to breath sounds) PT Visit Diagnosis: Other abnormalities of gait and mobility (R26.89);Muscle weakness (generalized) (M62.81)     Time: 0354-6568 PT Time Calculation (min) (ACUTE ONLY): 42 min  Charges:  $Therapeutic Activity: 23-37 mins                     [name], PT Acute Rehabilitation Services Pager [phone]  Office [phone]    [name] 09/07/2018, 1:11 PM

## 2018-09-07 NOTE — NC FL2 (Signed)
[place] LEVEL OF CARE SCREENING TOOL     IDENTIFICATION  Patient Name: [name] [facility] Birthdate: 09-Dec-1940 Sex: female Admission Date (Current Location): 09/03/2018  [facility] and [place] Number:  Herbalist and Address:  [facility],  [place] [address], [place]      Provider Number: [id number]  Attending Physician Name and Address:  Mendel Corning, MD  Relative Name and Phone Number:       Current Level of Care: Hospital Recommended Level of Care: [place] Prior Approval Number:    Date Approved/Denied:   PASRR Number: [id number]  Discharge Plan: SNF    Current Diagnoses: Patient Active Problem List   Diagnosis Date Noted  . Elevated brain natriuretic peptide (BNP) level 09/03/2018  . Troponin I above reference range 09/03/2018  . 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 Hyperkalemia 09/03/2018  . Acute renal failure superimposed on stage 4 chronic kidney disease ([place])   . Pressure injury of skin 07/24/2018  . Aortic atherosclerosis ([place]) 07/24/2018  . Occult blood positive stool   . Anemia due to GI blood loss 07/23/2018  . Essential hypertension 07/05/2018  . CHF (congestive heart failure), NYHA class IV, chronic, combined ([place]) 07/05/2018  . Diabetic hyperosmolar non-ketotic state ([place]) 06/17/2018  . Hyperglycemia 06/16/2018  . Type 2 diabetes mellitus ([place]) 06/16/2018  . UTI (urinary tract infection) 06/16/2018  . Sacral decubitus ulcer 06/16/2018  . Hypertensive urgency 04/04/2017  . CKD (chronic kidney disease) stage 4, GFR 15-29 ml/min (H[facility]) 04/04/2017  . Hypoglycemia   . Metabolic acidosis with normal anion gap and bicarbonate losses 11/23/2016  . Midfoot ulcer, left, limited to breakdown of skin ([place]) 06/13/2016  . Idiopathic chronic venous hypertension of both lower extremities with inflammation 03/10/2016  . Onychomycosis  03/10/2016  . Chronic obstructive pulmonary disease ([place]) 11/07/2013  . Upper airway cough syndrome 11/07/2013  . Insomnia 12/10/2012  . OSA (obstructive sleep apnea) 09/17/2012  . Benign neoplasm of colon 08/14/2012  . Rectal bleeding 08/13/2012    Class: Acute  . Anemia of chronic disease 08/13/2012    Class: Acute  . Osteoarthritis 08/13/2012    Class: Chronic  . Acute posthemorrhagic anemia 08/13/2012  . Diverticulosis of colon (without mention of hemorrhage) 08/13/2012  . Hoarseness 07/20/2011  . COPD with asthma ([place]) 04/20/2011  . Allergic rhinitis 04/20/2011  . GI bleed 02/08/2011  . Personal history of colonic polyps 04/16/2010  . Fecal incontinence 03/23/2010  . Change in bowel habits 03/23/2010    Orientation RESPIRATION BLADDER Height & Weight     Self, Time, Situation, Place  O2(2L [place] O2) External catheter Weight: 87.4 kg Height:  5\' 5"  (165.1 cm)  BEHAVIORAL SYMPTOMS/MOOD NEUROLOGICAL BOWEL NUTRITION STATUS      Incontinent Diet(Heart Healthy/Carb Modified)  AMBULATORY STATUS COMMUNICATION OF NEEDS Skin   Extensive Assist Verbally Other (Comment)(Abdomen wound, bilateral feet)                       Personal Care Assistance Level of Assistance  Bathing, Feeding, Dressing Bathing Assistance: Maximum assistance Feeding assistance: Maximum assistance Dressing Assistance: Maximum assistance     Functional Limitations Info  Sight, Hearing, Speech Sight Info: Adequate Hearing Info: Adequate Speech Info: Adequate    SPECIAL CARE FACTORS FREQUENCY  PT (By licensed PT), OT (By licensed OT)     PT Frequency: x5 OT Frequency: x5  Contractures Contractures Info: Not present    Additional Factors Info  Code Status, Allergies Code Status Info: FULL Allergies Info: All generics make her sick.           Current Medications (09/07/2018):  This is the current hospital active medication list Current Facility-Administered Medications   Medication Dose Route Frequency Provider Last Rate Last Dose  . acetaminophen (TYLENOL) tablet 650 mg  650 mg Oral Q6H PRN Hollice Gong, Mir Earlie Server, MD       Or  . acetaminophen (TYLENOL) suppository 650 mg  650 mg Rectal Q6H PRN Hollice Gong, Mir Mohammed, MD      . amLODipine (NORVASC) tablet 10 mg  10 mg Oral Daily Hollice Gong, Mir Mohammed, MD   10 mg at 09/07/18 1014  . chlorhexidine (PERIDEX) 0.12 % solution 15 mL  15 mL Mouth Rinse BID Dhungel, Nishant, MD   15 mL at 09/07/18 1012  . Chlorhexidine Gluconate Cloth 2 % PADS 6 each  6 each Topical Q0600 Dhungel, Nishant, MD   6 each at 09/06/18 0626  . cyclobenzaprine (FLEXERIL) tablet 10 mg  10 mg Oral TID PRN Tomma Rakers, MD      . docusate sodium (COLACE) capsule 100 mg  100 mg Oral BID Hollice Gong, Mir Mohammed, MD   100 mg at 09/07/18 1013  . feeding supplement (ENSURE ENLIVE) (ENSURE ENLIVE) liquid 237 mL  237 mL Oral BID BM Rai, Ripudeep K, MD   237 mL at 09/05/18 1302  . feeding supplement (PRO-STAT SUGAR FREE 64) liquid 30 mL  30 mL Oral BID Rai, Ripudeep K, MD   30 mL at 09/06/18 1755  . folic acid (FOLVITE) tablet 0.5 mg  0.5 mg Oral Daily Hollice Gong, Mir Mohammed, MD   0.5 mg at 09/07/18 1014  . furosemide (LASIX) injection 80 mg  80 mg Intravenous Q12H Sande Rives E, PA-C   80 mg at 09/07/18 0515  . heparin injection 5,000 Units  5,000 Units Subcutaneous Q8H Hollice Gong, Mir Mohammed, MD   5,000 Units at 09/07/18 0515  . hydrALAZINE (APRESOLINE) tablet 10 mg  10 mg Oral Q8H Buford Dresser, MD   10 mg at 09/07/18 0514  . insulin aspart (novoLOG) injection 0-15 Units  0-15 Units Subcutaneous TID WC Tomma Rakers, MD   3 Units at 09/06/18 1752  . insulin aspart (novoLOG) injection 0-5 Units  0-5 Units Subcutaneous QHS Hollice Gong, Mir Mohammed, MD      . insulin detemir (LEVEMIR) injection 10 Units  10 Units Subcutaneous QHS Tomma Rakers, MD   10 Units at 09/06/18 2152  .  ipratropium-albuterol (DUONEB) 0.5-2.5 (3) MG/3ML nebulizer solution 3 mL  3 mL Nebulization Q6H PRN Hollice Gong, Mir Mohammed, MD      . iron polysaccharides (NIFEREX) capsule 150 mg  150 mg Oral BID Hollice Gong, Mir Mohammed, MD   150 mg at 09/07/18 1015  . MEDLINE mouth rinse  15 mL Mouth Rinse q12n4p Dhungel, Nishant, MD   15 mL at 09/06/18 1755  . montelukast (SINGULAIR) tablet 10 mg  10 mg Oral QHS Hollice Gong, Mir Mohammed, MD   10 mg at 09/06/18 2150  . multivitamin (PROSIGHT) tablet 1 tablet  1 tablet Oral Daily Hollice Gong, Mir Mohammed, MD   1 tablet at 09/07/18 1015  . mupirocin ointment (BACTROBAN) 2 % 1 application  1 application Nasal BID Dhungel, Nishant, MD   1 application at 16/10/96 1015  . omega-3 acid ethyl esters (LOVAZA) capsule 1 g  1 g Oral Daily Lassalle Comunidad, Mir [place],  MD   1 g at 09/07/18 1015  . oxyCODONE-acetaminophen (PERCOCET/ROXICET) 5-325 MG per tablet 1 tablet  1 tablet Oral Q6H PRN Tomma Rakers, MD   1 tablet at 09/07/18 0349  . pantoprazole (PROTONIX) EC tablet 40 mg  40 mg Oral Q0600 Tomma Rakers, MD   40 mg at 09/07/18 0514  . pyridOXINE (VITAMIN B-6) tablet 100 mg  100 mg Oral Daily Hollice Gong, Mir Mohammed, MD   100 mg at 09/07/18 1012  . senna (SENOKOT) tablet 8.6 mg  1 tablet Oral BID Hollice Gong, Mir Mohammed, MD   8.6 mg at 09/07/18 1016  . simvastatin (ZOCOR) tablet 20 mg  20 mg Oral Daily Hollice Gong, Mir Mohammed, MD   20 mg at 09/07/18 1016  . traZODone (DESYREL) tablet 50 mg  50 mg Oral QHS Hollice Gong, Mir Mohammed, MD   50 mg at 09/06/18 2150  . vitamin B-12 (CYANOCOBALAMIN) tablet 1,000 mcg  1,000 mcg Oral Daily Hollice Gong, Mir Mohammed, MD   1,000 mcg at 09/07/18 1013  . vitamin E capsule 400 Units  400 Units Oral Daily Tomma Rakers, MD   400 Units at 09/07/18 1012     Discharge Medications: Please see discharge summary for a list of discharge medications.  Relevant Imaging Results:  Relevant Lab  Results:   Additional Information [id number]  Purcell Mouton, RN

## 2018-09-08 DIAGNOSIS — E1122 Type 2 diabetes mellitus with diabetic chronic kidney disease: Secondary | ICD-10-CM

## 2018-09-08 DIAGNOSIS — N179 Acute kidney failure, unspecified: Secondary | ICD-10-CM

## 2018-09-08 DIAGNOSIS — G4733 Obstructive sleep apnea (adult) (pediatric): Secondary | ICD-10-CM

## 2018-09-08 DIAGNOSIS — J9621 Acute and chronic respiratory failure with hypoxia: Secondary | ICD-10-CM

## 2018-09-08 DIAGNOSIS — Z794 Long term (current) use of insulin: Secondary | ICD-10-CM

## 2018-09-08 DIAGNOSIS — I2721 Secondary pulmonary arterial hypertension: Secondary | ICD-10-CM

## 2018-09-08 DIAGNOSIS — I10 Essential (primary) hypertension: Secondary | ICD-10-CM

## 2018-09-08 DIAGNOSIS — D638 Anemia in other chronic diseases classified elsewhere: Secondary | ICD-10-CM

## 2018-09-08 LAB — CBC
HCT: 27.1 % — ABNORMAL LOW (ref 36.0–46.0)
Hemoglobin: 8.1 g/dL — ABNORMAL LOW (ref 12.0–15.0)
MCH: 29 pg (ref 26.0–34.0)
MCHC: 29.9 g/dL — ABNORMAL LOW (ref 30.0–36.0)
MCV: 97.1 fL (ref 80.0–100.0)
Platelets: 222 10*3/uL (ref 150–400)
RBC: 2.79 MIL/uL — ABNORMAL LOW (ref 3.87–5.11)
RDW: 17.4 % — ABNORMAL HIGH (ref 11.5–15.5)
WBC: 6.5 10*3/uL (ref 4.0–10.5)
nRBC: 0 % (ref 0.0–0.2)

## 2018-09-08 LAB — GLUCOSE, CAPILLARY
Glucose-Capillary: 103 mg/dL — ABNORMAL HIGH (ref 70–99)
Glucose-Capillary: 152 mg/dL — ABNORMAL HIGH (ref 70–99)
Glucose-Capillary: 163 mg/dL — ABNORMAL HIGH (ref 70–99)
Glucose-Capillary: 147 mg/dL — ABNORMAL HIGH (ref 70–99)

## 2018-09-08 LAB — BASIC METABOLIC PANEL
Anion gap: 11 (ref 5–15)
BUN: 97 mg/dL — ABNORMAL HIGH (ref 8–23)
CO2: 29 mmol/L (ref 22–32)
Calcium: 8.7 mg/dL — ABNORMAL LOW (ref 8.9–10.3)
Chloride: 100 mmol/L (ref 98–111)
Creatinine, Ser: 2.23 mg/dL — ABNORMAL HIGH (ref 0.44–1.00)
GFR calc Af Amer: 24 mL/min — ABNORMAL LOW (ref >60)
GFR calc non Af Amer: 21 mL/min — ABNORMAL LOW (ref >60)
Glucose, Bld: 126 mg/dL — ABNORMAL HIGH (ref 70–99)
Potassium: 5 mmol/L (ref 3.5–5.1)
Sodium: 140 mmol/L (ref 135–145)

## 2018-09-08 MED ORDER — ALUM & MAG HYDROXIDE-SIMETH 200-200-20 MG/5ML PO SUSP
30.0000 mL | ORAL | Status: DC | PRN
  Administered 2018-09-08 – 2018-09-09 (×2): 30 mL via ORAL
  Filled 2018-09-08 (×2): qty 30

## 2018-09-08 MED ORDER — HYDRALAZINE HCL 25 MG PO TABS
25.0000 mg | ORAL_TABLET | Freq: Three times a day (TID) | ORAL | Status: DC
  Administered 2018-09-08 – 2018-09-09 (×3): 25 mg via ORAL
  Filled 2018-09-08 (×3): qty 1

## 2018-09-08 NOTE — Progress Notes (Signed)
[facility]                                                                              [name], is a 78 y.o. female, DOB - [birth year], ZOX:[id number]  Admit date - 09/03/2018   Admitting Physician Mir Marry Guan, MD  Outpatient Primary MD for the patient is Garwin Brothers, MD  Outpatient specialists:   LOS - 5  days    Brief summary   Patient is a [name] with combined systolic and diastolic CHF (recent 2D echo with normal EF), type 2 diabetes mellitus, COPD with chronic respiratory failure on nocturnal home O2, anemia, obstructive sleep apnea, history of peptic ulcer disease, hypertension and hyperlipidemia presented to the hospital with increased breathing for past few days.  She was evaluated at [facility] ED today prior to admission for shortness of breath and discharged back.  She was noted to be hypoxic with O2 sat of 88% on 4 L nasal cannula.  EMS noted patient had wheezing and rales and treated with epinephrine and nitroglycerin. She was found tachypneic and hypoxic with acute on chronic combined systolic and diastolic CHF with chest x-ray showing pulmonary vascular congestion.  Also noted for acute on chronic kidney disease and hyperkalemia.  Patient was given Lasix 80 mg IV x1 and admitted for further work-up and treatment. COVID-19 test negative  Assessment & Plan  : Acute on chronic respiratory failure with hypoxia likely secondary to acute on chronic combined systolic and diastolic CHF (congestive heart failure), NYHA class IV, ([place])  Patient had hypoxia, crackles and pulmonary vascular congestion with acute on chronic kidney disease.  Cardiology was consulted due to persistent hypoxia and  Increased Lasix to 80 mg IV every 12 hours.  On torsemide at home.  Having good diuresis and clinical improvement.  Audiology following.   Acute kidney injury on CKD stage IV -Possibility of cardiorenal syndrome with acute CHF. [facility] outpatient,-Creatinine stable, 2.3>2.2 on IV Lasix.  Hyperkalemia -Resolved.  Potassium of 5.0 today.  COPD, underlying chronic respiratory failure on 2 L chronic O2 at night, acute on chronic respiratory failure -Continue O2, wean as tolerated -Continue duo nebs, Singulair, add Pulmicort Continue IV diuresis for now.  Diabetes mellitus type 2, uncontrolled with CKD -CBGs stable, continue Lantus, sliding scale insulin   Elevated troponin -Likely due to demand ischemia with CHF, CKD.  No chest pain or abnormal EKG.  Pressure injury wounds, POA -Bilateral heel pressure injuries, stage III on right,  -Unstageable left foot, full-thickness tissue loss, stage II left hip ulcer Wound care  Anemia of chronic kidney disease.  Hemoglobin 8.1.  No evidence of bleeding.  Code Status: Full code  DVT Prophylaxis: Heparin subcu  Family Communication: None today  Disposition Plan: Physical therapy.  Recommendation is skilled nursing facility after medical stabilization.   Procedures:  2D echo  Consultants:   Cardiology  Antimicrobials:   Anti-infectives (From admission, onward)   None      Subjective:   Patient feels little little better with breathing today.  Denies any chest pain, fever, chills, rigor.  Objective:  Vitals:   09/07/18 2354 09/08/18 0500 09/08/18 0541 09/08/18 0854  BP:  (!) 166/79    Pulse: 70 61    Resp: (!) 23 20    Temp:  99.3 F (37.4 C)    TempSrc:  Oral    SpO2: 97% 96%  98%  Weight:   85.5 kg   Height:        Intake/Output Summary (Last 24 hours) at 09/08/2018 1023 Last data filed at 09/08/2018 0533 Gross per 24 hour  Intake 320 ml  Output 2000 ml  Net -1680 ml     Wt Readings from Last 3 Encounters:  09/08/18 85.5 kg  08/02/18 81.6 kg  07/10/18 72 kg   Physical Exam General:  Average built, not in obvious distress, on nasal cannula.  Mildly tachypneic. HENT: Normocephalic, pupils equally reacting  to light and accommodation.  No scleral pallor or icterus noted. Oral mucosa is moist.  Chest:  Diminished breath sounds bilaterally with prolonged expiration. CVS: S1 &S2 heard.  Systolic murmur noted.  Regular rate and rhythm.  Prominent neck veins. Abdomen: Soft, nontender, nondistended.  Bowel sounds are heard.  Liver is not palpable, no abdominal mass palpated Extremities: No cyanosis, clubbing but chronic edema.  Stage III right heel ulcer, unstageable left heel ulceration. Psych: Alert, awake and oriented, normal mood CNS:  No cranial nerve deficits.  Power equal in all extremities.  No sensory deficits noted.  No cerebellar signs.   Skin: Left heel pressure ulceration present on admission, unstageable left foot ulcer, right heel stage III pressure ulceration present on admission.   Medications  Scheduled Meds: . amLODipine  10 mg Oral Daily  . budesonide (PULMICORT) nebulizer solution  0.25 mg Nebulization BID  . chlorhexidine  15 mL Mouth Rinse BID  . Chlorhexidine Gluconate Cloth  6 each Topical Q0600  . docusate sodium  100 mg Oral BID  . feeding supplement (ENSURE ENLIVE)  237 mL Oral BID BM  . feeding supplement (PRO-STAT SUGAR FREE 64)  30 mL Oral BID  . folic acid  0.5 mg Oral Daily  . furosemide  80 mg Intravenous Q12H  . heparin  5,000 Units Subcutaneous Q8H  . hydrALAZINE  25 mg Oral Q8H  . insulin aspart  0-15 Units Subcutaneous TID WC  . insulin aspart  0-5 Units Subcutaneous QHS  . insulin detemir  10 Units Subcutaneous QHS  . ipratropium-albuterol  3 mL Nebulization TID  . iron polysaccharides  150 mg Oral BID  . mouth rinse  15 mL Mouth Rinse q12n4p  . montelukast  10 mg Oral QHS  . multivitamin  1 tablet Oral Daily  . mupirocin ointment  1 application Nasal BID  . omega-3 acid ethyl esters  1 g Oral Daily  . pantoprazole  40 mg Oral Q0600  . pyridOXINE  100 mg Oral Daily  . senna  1 tablet Oral BID  . simvastatin  20 mg Oral Daily  . traZODone  50 mg Oral  QHS  . vitamin B-12  1,000 mcg Oral Daily  . vitamin E  400 Units Oral Daily   Continuous Infusions: PRN Meds:.acetaminophen **OR** acetaminophen, cyclobenzaprine, oxyCODONE-acetaminophen   Data Reviewed:  I have personally reviewed following labs and imaging studies  Micro Results Recent Results (from the past 240 hour(s))  Blood culture (routine x 2)     Status: None   Collection Time: 09/01/18  3:59 PM   Specimen: BLOOD RIGHT HAND  Result Value Ref Range Status  Specimen Description BLOOD RIGHT HAND  Final   Special Requests   Final    BOTTLES DRAWN AEROBIC AND ANAEROBIC Blood Culture results may not be optimal due to an inadequate volume of blood received in culture bottles   Culture   Final    NO GROWTH 5 DAYS Performed at [facility], [place] [address] [place], [place] [address]    Report Status 09/06/2018 FINAL  Final  Blood culture (routine x 2)     Status: None   Collection Time: 09/01/18  4:06 PM   Specimen: BLOOD LEFT FOREARM  Result Value Ref Range Status   Specimen Description BLOOD LEFT FOREARM  Final   Special Requests   Final    BOTTLES DRAWN AEROBIC AND ANAEROBIC Blood Culture results may not be optimal due to an inadequate volume of blood received in culture bottles   Culture   Final    NO GROWTH 5 DAYS Performed at [facility], [place] [address], [place], [place] [address]    Report Status 09/06/2018 FINAL  Final  SARS CORONAVIRUS 2 Nasal Swab Aptima Multi Swab     Status: None   Collection Time: 09/03/18  6:33 AM   Specimen: Aptima Multi Swab; Nasal Swab  Result Value Ref Range Status   SARS Coronavirus 2 NEGATIVE NEGATIVE Final    Comment: (NOTE) SARS-CoV-2 target nucleic acids are NOT DETECTED. The SARS-CoV-2 RNA is generally detectable in upper and lower respiratory specimens during the acute phase of infection. Negative results do not preclude SARS-CoV-2 infection, do not rule out co-infections with other pathogens, and should not be  used as the sole basis for treatment or other patient management decisions. Negative results must be combined with clinical observations, patient history, and epidemiological information. The expected result is Negative. Fact Sheet for Patients: SugarRoll.be Fact Sheet for Healthcare Providers: https://www.woods-mathews.com/ This test is not yet approved or cleared by the [place] FDA and  has been authorized for detection and/or diagnosis of SARS-CoV-2 by FDA under an Emergency Use Authorization (EUA). This EUA will remain  in effect (meaning this test can be used) for the duration of the COVID-19 declaration under Section 56 4(b)(1) of the Act, 21 U.S.C. section 360bbb-3(b)(1), unless the authorization is terminated or revoked sooner. Performed at [facility], [place] [address], [place], [place] [address]   MRSA PCR Screening     Status: Abnormal   Collection Time: 09/03/18  8:37 PM   Specimen: Nasal Mucosa; Nasopharyngeal  Result Value Ref Range Status   MRSA by PCR POSITIVE (A) NEGATIVE Final    Comment:        The GeneXpert MRSA Assay (FDA approved for NASAL specimens only), is one component of a comprehensive MRSA colonization surveillance program. It is not intended to diagnose MRSA infection nor to guide or monitor treatment for MRSA infections. RESULT CALLED TO, READ BACK BY AND VERIFIED WITH: STEFFENS,M @ 0447 ON [id number] BY POTEAT,S Performed at [facility] [address], [place], [place] [address]     Radiology Reports Dg Chest 2 View  Result Date: 09/01/2018 CLINICAL DATA:  Chills and cough. EXAM: CHEST - 2 VIEW COMPARISON:  08/01/2018 FINDINGS: Low volumes. The cardio pericardial silhouette is enlarged. Pulmonary vascular congestion noted with interstitial pulmonary edema pattern. No substantial pleural effusion. The visualized bony structures of the thorax are intact. Degenerative changes  noted left shoulder. IMPRESSION: Cardiomegaly with interstitial pulmonary edema. Electronically Signed   By: Misty Stanley  M.D.   On: 09/01/2018 16:12   Dg Chest Port 1 View  Result Date: 09/07/2018 CLINICAL DATA:  Dyspnea. EXAM: PORTABLE CHEST 1 VIEW COMPARISON:  Chest x-ray dated September 03, 2018. FINDINGS: Unchanged cardiomegaly. Diffuse interstitial and hazy airspace opacities have mildly worsened. No pleural effusion or pneumothorax. No acute osseous abnormality. IMPRESSION: 1. Worsening pulmonary edema. Electronically Signed   By: Titus Dubin M.D.   On: 09/07/2018 13:51   Dg Chest Port 1 View  Result Date: 09/03/2018 CLINICAL DATA:  Shortness of breath and wheezing EXAM: PORTABLE CHEST 1 VIEW COMPARISON:  Two days ago FINDINGS: Cardiomegaly with perihilar vascular congestion. Lung volumes are low. No effusion, Kerley lines, or pneumothorax. IMPRESSION: Cardiomegaly and vascular congestion with possible perihilar edema. Electronically Signed   By: Monte Fantasia M.D.   On: 09/03/2018 04:14   Vas [place] Upper Extremity Venous Duplex  Result Date: 09/04/2018 UPPER VENOUS STUDY  Indications: Edema Left forearm and arm Limitations: Body habitus and forearm edema. Comparison Study: No previous study available for comparison Performing Technologist: Toma Copier RVS  Examination Guidelines: A complete evaluation includes B-mode imaging, spectral Doppler, color Doppler, and power Doppler as needed of all accessible portions of each vessel. Bilateral testing is considered an integral part of a complete examination. Limited examinations for reoccurring indications may be performed as noted.  Right Findings: +----------+------------+---------+-----------+----------+-------+ RIGHT     CompressiblePhasicitySpontaneousPropertiesSummary +----------+------------+---------+-----------+----------+-------+ Subclavian    Full       Yes       Yes                       +----------+------------+---------+-----------+----------+-------+  Left Findings: +----------+------------+---------+-----------+----------+---------------------+ LEFT      CompressiblePhasicitySpontaneousProperties       Summary        +----------+------------+---------+-----------+----------+---------------------+ IJV           Full       Yes       Yes                                    +----------+------------+---------+-----------+----------+---------------------+ Subclavian    Full       Yes       Yes                                    +----------+------------+---------+-----------+----------+---------------------+ Axillary      Full       Yes       Yes                                    +----------+------------+---------+-----------+----------+---------------------+ Brachial      Full       Yes       Yes                                    +----------+------------+---------+-----------+----------+---------------------+ Radial        Full                                                        +----------+------------+---------+-----------+----------+---------------------+  Ulnar         Full                                                        +----------+------------+---------+-----------+----------+---------------------+ Cephalic    Partial                                 Acute in [facility] into                                                        the mid upper arm   +----------+------------+---------+-----------+----------+---------------------+ Basilic                                              Unable to visualize                                                       the forearm due to                                                       edema. The upper arm                                                           is negative      +----------+------------+---------+-----------+----------+---------------------+   Summary:  Right: No evidence of thrombosis in the subclavian.  Left: No evidence of deep vein thrombosis in the upper extremity. No evidence of thrombosis in the subclavian. Findings consistent with acute superficial vein thrombosis involving the left cephalic vein.  *See table(s) above for measurements and observations.  Diagnosing physician: Deitra Mayo MD Electronically signed by Deitra Mayo MD on 09/04/2018 at 1:22:51 PM.    Final     Lab Data:  CBC: Recent Labs  Lab 09/01/18 1538 09/03/18 0429 09/04/18 0449 09/07/18 0338 09/08/18 0419  WBC 8.1 7.7 7.6 6.2 6.5  NEUTROABS 6.3 5.6  --   --   --   HGB 8.6* 9.1* 8.7* 8.3* 8.1*  HCT 29.3* 30.3* 29.7* 28.4* 27.1*  MCV 99.0 98.4 99.3 97.6 97.1  PLT 216 205 208 218 427   Basic Metabolic Panel: Recent Labs  Lab 09/04/18 0449 09/05/18 0448 09/06/18 0359 09/07/18 0338 09/08/18 0419  NA 142 139 139 141 140  K 4.8 4.4 4.7 4.6 5.0  CL 106 103 102 103 100  CO2 29 28 23 29 29   GLUCOSE 143* 213* 218* 137* 126*  BUN 92*  91* 95* 96* 97*  CREATININE 2.33* 2.41* 2.59* 2.32* 2.23*  CALCIUM 8.6* 8.5* 8.4* 8.8* 8.7*   GFR: Estimated Creatinine Clearance: 23.2 mL/min (A) (by C-G formula based on SCr of 2.23 mg/dL (H)). Liver Function Tests: Recent Labs  Lab 09/01/18 1538  AST 19  ALT 27  ALKPHOS 58  BILITOT 0.8  PROT 7.3  ALBUMIN 3.1*   No results for input(s): LIPASE, AMYLASE in the last 168 hours. No results for input(s): AMMONIA in the last 168 hours. Coagulation Profile: No results for input(s): INR, PROTIME in the last 168 hours. Cardiac Enzymes: No results for input(s): CKTOTAL, CKMB, CKMBINDEX, TROPONINI in the last 168 hours. BNP (last 3 results) No results for input(s): PROBNP in the last 8760 hours. HbA1C: No results for input(s): HGBA1C in the last 72 hours. CBG: Recent Labs  Lab 09/07/18 0800 09/07/18 1114 09/07/18 1605 09/07/18 2120 09/08/18 0743  GLUCAP 109* 123* 199* 189* 103*   Lipid  Profile: Recent Labs    09/06/18 0359  CHOL 111  HDL 36*  LDLCALC 57  TRIG 91  CHOLHDL 3.1   Thyroid Function Tests: No results for input(s): TSH, T4TOTAL, FREET4, T3FREE, THYROIDAB in the last 72 hours. Anemia Panel: No results for input(s): VITAMINB12, FOLATE, FERRITIN, TIBC, IRON, RETICCTPCT in the last 72 hours. Urine analysis:    Component Value Date/Time   COLORURINE STRAW (A) 07/23/2018 1656   APPEARANCEUR CLEAR 07/23/2018 1656   LABSPEC 1.011 07/23/2018 1656   PHURINE 5.0 07/23/2018 1656   GLUCOSEU NEGATIVE 07/23/2018 1656   HGBUR NEGATIVE 07/23/2018 1656   BILIRUBINUR NEGATIVE 07/23/2018 1656   KETONESUR NEGATIVE 07/23/2018 1656   PROTEINUR NEGATIVE 07/23/2018 1656   UROBILINOGEN 0.2 12/16/2011 1717   NITRITE NEGATIVE 07/23/2018 1656   LEUKOCYTESUR NEGATIVE 07/23/2018 1656     Alliya Marcon M.D. [facility] 09/08/2018, 10:23 AM

## 2018-09-08 NOTE — Progress Notes (Signed)
Progress Note  Patient Name: [name] [facility] Date of Encounter: 09/08/2018  Primary Cardiologist: [place]  Subjective   She reports that she is breathing better since yesterday.  She is very groggy and falls asleep every 15 or 20 seconds, but her responses appear to be appropriate. She denies pain. Excellent diuresis negative almost 5 L since admission (may be higher since documentation of ins/out was poor yesterday). Weight is down roughly 4 pounds since yesterday. Creatinine essentially unchanged may be slightly improved despite diuresis. Hemoglobin stable around 8.  Inpatient Medications    Scheduled Meds: . amLODipine  10 mg Oral Daily  . budesonide (PULMICORT) nebulizer solution  0.25 mg Nebulization BID  . chlorhexidine  15 mL Mouth Rinse BID  . Chlorhexidine Gluconate Cloth  6 each Topical Q0600  . docusate sodium  100 mg Oral BID  . feeding supplement (ENSURE ENLIVE)  237 mL Oral BID BM  . feeding supplement (PRO-STAT SUGAR FREE 64)  30 mL Oral BID  . folic acid  0.5 mg Oral Daily  . furosemide  80 mg Intravenous Q12H  . heparin  5,000 Units Subcutaneous Q8H  . hydrALAZINE  10 mg Oral Q8H  . insulin aspart  0-15 Units Subcutaneous TID WC  . insulin aspart  0-5 Units Subcutaneous QHS  . insulin detemir  10 Units Subcutaneous QHS  . ipratropium-albuterol  3 mL Nebulization TID  . iron polysaccharides  150 mg Oral BID  . mouth rinse  15 mL Mouth Rinse q12n4p  . montelukast  10 mg Oral QHS  . multivitamin  1 tablet Oral Daily  . mupirocin ointment  1 application Nasal BID  . omega-3 acid ethyl esters  1 g Oral Daily  . pantoprazole  40 mg Oral Q0600  . pyridOXINE  100 mg Oral Daily  . senna  1 tablet Oral BID  . simvastatin  20 mg Oral Daily  . traZODone  50 mg Oral QHS  . vitamin B-12  1,000 mcg Oral Daily  . vitamin E  400 Units Oral Daily   Continuous Infusions:  PRN Meds: acetaminophen **OR** acetaminophen, cyclobenzaprine, oxyCODONE-acetaminophen    Vital Signs    Vitals:   09/07/18 2354 09/08/18 0500 09/08/18 0541 09/08/18 0854  BP:  (!) 166/79    Pulse: 70 61    Resp: (!) 23 20    Temp:  99.3 F (37.4 C)    TempSrc:  Oral    SpO2: 97% 96%  98%  Weight:   85.5 kg   Height:        Intake/Output Summary (Last 24 hours) at 09/08/2018 0858 Last data filed at 09/08/2018 0533 Gross per 24 hour  Intake 320 ml  Output 2000 ml  Net -1680 ml   Last 3 Weights 09/08/2018 09/05/2018 09/03/2018  Weight (lbs) 188 lb 7.9 oz 192 lb 10.9 oz 187 lb 14.4 oz  Weight (kg) 85.5 kg 87.4 kg 85.231 kg      Telemetry    She has not yet been placed on telemetry- Personally Reviewed  ECG    09/03/2018-sinus rhythm, left ventricular hypertrophy, prominent anterior T wave inversion- Personally Reviewed  Physical Exam  Groggy, easily arousable GEN: No acute distress.  Tachypneic. Neck:  At least 10 cm JVD, seen to the angle of the jaw Cardiac: RRR, 3/6 mid peaking systolic ejection murmur heard best at the right upper sternal border radiating towards the carotids, holosystolic murmurs are heard both at the apex and at the left lower sternal  border, no diastolic murmurs, rubs, or gallops.  Respiratory:  Diminished breath sounds throughout with prolonged expiration but no overt wheezing GI: Soft, nontender, non-distended  MS:  Symmetrical changes of chronic brawny edema, seems to be developing some wrinkles; No deformity. Neuro:  Nonfocal, sleepy Psych: Normal affect   Labs    High Sensitivity Troponin:   Recent Labs  Lab 09/03/18 0345 09/03/18 0600  TROPONINIHS 75* 72*      Chemistry Recent Labs  Lab 09/01/18 1538  09/06/18 0359 09/07/18 0338 09/08/18 0419  NA 142   < > 139 141 140  K 5.5*   < > 4.7 4.6 5.0  CL 107   < > 102 103 100  CO2 25   < > 23 29 29   GLUCOSE 187*   < > 218* 137* 126*  BUN 95*   < > 95* 96* 97*  CREATININE 2.74*   < > 2.59* 2.32* 2.23*  CALCIUM 8.4*   < > 8.4* 8.8* 8.7*  PROT 7.3  --   --   --   --    ALBUMIN 3.1*  --   --   --   --   AST 19  --   --   --   --   ALT 27  --   --   --   --   ALKPHOS 58  --   --   --   --   BILITOT 0.8  --   --   --   --   GFRNONAA 16*   < > 17* 20* 21*  GFRAA 19*   < > 20* 23* 24*  ANIONGAP 10   < > 14 9 11    < > = values in this interval not displayed.     Hematology Recent Labs  Lab 09/04/18 0449 09/07/18 0338 09/08/18 0419  WBC 7.6 6.2 6.5  RBC 2.99* 2.91* 2.79*  HGB 8.7* 8.3* 8.1*  HCT 29.7* 28.4* 27.1*  MCV 99.3 97.6 97.1  MCH 29.1 28.5 29.0  MCHC 29.3* 29.2* 29.9*  RDW 17.8* 17.4* 17.4*  PLT 208 218 222    BNP Recent Labs  Lab 09/01/18 1538 09/03/18 0429  BNP 512.4* 696.0*     DDimer No results for input(s): DDIMER in the last 168 hours.   Radiology    Dg Chest Port 1 View  Result Date: 09/07/2018 CLINICAL DATA:  Dyspnea. EXAM: PORTABLE CHEST 1 VIEW COMPARISON:  Chest x-ray dated September 03, 2018. FINDINGS: Unchanged cardiomegaly. Diffuse interstitial and hazy airspace opacities have mildly worsened. No pleural effusion or pneumothorax. No acute osseous abnormality. IMPRESSION: 1. Worsening pulmonary edema. Electronically Signed   By: Titus Dubin M.D.   On: 09/07/2018 13:51    Cardiac Studies   Echo June 19, 2018  1. The left ventricle has hyperdynamic systolic function, with an ejection fraction of >65%. The cavity size was normal. There is mildly increased left ventricular wall thickness. Left ventricular diastolic Doppler parameters are consistent with  pseudonormalization. Elevated left atrial and left ventricular end-diastolic pressures No evidence of left ventricular regional wall motion abnormalities.  2. The right ventricle has normal systolic function. The cavity was mildly enlarged. There is no increase in right ventricular wall thickness. Right ventricular systolic pressure is severely elevated with an estimated pressure of 79.2 mmHg.  3. Left atrial size was severely dilated.  4. Right atrial size was  moderately dilated.  5. Tricuspid valve regurgitation is mild-moderate.  6. The aortic valve is tricuspid.  Severely thickening of the aortic valve. Severe calcifcation of the aortic valve. Aortic valve regurgitation is mild by color flow Doppler. Moderate stenosis of the aortic valve. AV Mean Grad: 25.0 mmHg. AV Area (VTI):  1.54 cm. AV Vmax: 365.50 cm/s  7. The inferior vena cava was dilated in size with >50% respiratory variability.  Patient Profile     78 y.o. female with acute on chronic respiratory failure/hypoxia, longstanding severe COPD, chronic pulmonary artery hypertension, acute on chronic diastolic heart failure moderate aortic stenosis, mitral insufficiency with varying severity, CKD stage IV, DM  Assessment & Plan    1.CHF: Clinical findings of both right and left heart failure are found.  Preserved left ventricular systolic function, echo evidence of increased left atrial pressure, as well as increased right atrial pressure.  Diuresing well despite underlying renal insufficiency.  Continue intravenous diuretics with daily renal function monitoring.  Heart failure findings precede aortic stenosis.  It is not clear to what degree her mitral insufficiency is contributing to her heart failure.  On this most recent echo was described as mild, but her echo from June described it as moderate to severe.  We will compare the 2 studies. 2. PAH: Severe pulmonary hypertension also predates the progression of aortic stenosis.  Unclear to what degree it is related to chronic lung disease and left heart failure. 3.COPD: Prior to admission was already on home oxygen.  Receiving inhalers right now and does not have any wheezing but does have very prolonged expiration and tachypnea. 4.OSA: Very likely contributing to the pulmonary artery hypertension.  She is reported to not be compliant with CPAP.  I wonder if she also has obesity hypoventilation.  She has not seen her pulmonary specialist in over a  year.  Her blood gas has not been performed during this admission. 5.CKD 4: Thankfully, so far creatinine has been stable maybe even slightly improving with diuresis. 6. DM: On insulin.  Increases the likelihood that she has coronary disease.  She is not a candidate for invasive or contrast based studies. 7.  Elevated troponin: Flat pattern, not consistent with acute coronary syndrome.  She denies angina.  Although she has high likelihood of CAD, no reason for aggressive intervention at this time. 8. Anemia: Chronic.  Normochromic, normal ferritin and B12 levels in the recent past seems to be at baseline.  Note that for a patient with chronic hypoxemia, COPD and heart failure her degree of anemia is actually severe.  This will make it harder to treat her heart failure.  She might need EPO. 9.HTN: Blood pressure remains elevated.  On max dose amlodipine.  Will increase the dose of hydralazine.  Choices of antihypertensive agents severely limited by the presence of advanced kidney disease and reactive airway disease.  Overall long-term prognosis appears to be very poor due to the severity of underlying respiratory problems and pulmonary hypertension.     For questions or updates, please contact [place] Please consult [email or web address] for contact info under        Signed, Sanda Klein, MD  09/08/2018, 8:58 AM

## 2018-09-09 LAB — GLUCOSE, CAPILLARY
Glucose-Capillary: 112 mg/dL — ABNORMAL HIGH (ref 70–99)
Glucose-Capillary: 146 mg/dL — ABNORMAL HIGH (ref 70–99)
Glucose-Capillary: 172 mg/dL — ABNORMAL HIGH (ref 70–99)
Glucose-Capillary: 164 mg/dL — ABNORMAL HIGH (ref 70–99)

## 2018-09-09 LAB — CBC
HCT: 29.1 % — ABNORMAL LOW (ref 36.0–46.0)
Hemoglobin: 8.6 g/dL — ABNORMAL LOW (ref 12.0–15.0)
MCH: 28.8 pg (ref 26.0–34.0)
MCHC: 29.6 g/dL — ABNORMAL LOW (ref 30.0–36.0)
MCV: 97.3 fL (ref 80.0–100.0)
Platelets: 220 10*3/uL (ref 150–400)
RBC: 2.99 MIL/uL — ABNORMAL LOW (ref 3.87–5.11)
RDW: 17.6 % — ABNORMAL HIGH (ref 11.5–15.5)
WBC: 6.7 10*3/uL (ref 4.0–10.5)
nRBC: 0 % (ref 0.0–0.2)

## 2018-09-09 LAB — BASIC METABOLIC PANEL
Anion gap: 8 (ref 5–15)
BUN: 100 mg/dL — ABNORMAL HIGH (ref 8–23)
CO2: 33 mmol/L — ABNORMAL HIGH (ref 22–32)
Calcium: 9 mg/dL (ref 8.9–10.3)
Chloride: 99 mmol/L (ref 98–111)
Creatinine, Ser: 2.3 mg/dL — ABNORMAL HIGH (ref 0.44–1.00)
GFR calc Af Amer: 23 mL/min — ABNORMAL LOW (ref >60)
GFR calc non Af Amer: 20 mL/min — ABNORMAL LOW (ref >60)
Glucose, Bld: 161 mg/dL — ABNORMAL HIGH (ref 70–99)
Potassium: 4.7 mmol/L (ref 3.5–5.1)
Sodium: 140 mmol/L (ref 135–145)

## 2018-09-09 MED ORDER — HYDRALAZINE HCL 50 MG PO TABS
50.0000 mg | ORAL_TABLET | Freq: Three times a day (TID) | ORAL | Status: DC
  Administered 2018-09-09 – 2018-09-10 (×5): 50 mg via ORAL
  Filled 2018-09-09 (×6): qty 1

## 2018-09-09 NOTE — Progress Notes (Signed)
[facility]                                                                              [name], is a 78 y.o. female, DOB - 1940/08/31, XBD:[id number]  Admit date - 09/03/2018   Admitting Physician Mir Marry Guan, MD  Outpatient Primary MD for the patient is Garwin Brothers, MD  Outpatient specialists:   LOS - 6  days   Brief summary   Patient is a [name] with combined systolic and diastolic CHF (recent 2D echo with normal EF), type 2 diabetes mellitus, COPD with chronic respiratory failure on nocturnal home O2, anemia, obstructive sleep apnea, history of peptic ulcer disease, hypertension and hyperlipidemia presented to the hospital with increased breathing for past few days.  She was evaluated at [facility], ED today prior to admission for shortness of breath and discharged back.  She was noted to be hypoxic with O2 sat of 88% on 4 L nasal cannula.  EMS noted patient had wheezing and rales and treated with epinephrine and nitroglycerin. She was found tachypneic and hypoxic with acute on chronic combined systolic and diastolic CHF with chest x-ray showing pulmonary vascular congestion.  Also noted for acute on chronic kidney disease and hyperkalemia.  Patient was given Lasix 80 mg IV x1 and admitted for further work-up and treatment. COVID-19 test negative  Assessment & Plan  : Acute on chronic respiratory failure with hypoxia likely secondary to acute on chronic combined systolic and diastolic CHF (congestive heart failure). Cardiology was consulted.  On Lasix 80 mg twice a day.  On torsemide as outpatient.  Having good diuresis.  Cardiology following  Moderate aortic stenosis, mitral regurgitation with severe pulmonary hypertension. Bad prognosis overall.   Acute kidney injury on CKD stage IV -Possibility of cardiorenal syndrome with acute CHF. [facility] outpatient,-Creatinine stable, 2.3>2.2>2.3 on IV  Lasix.  Hyperkalemia -Resolved.  Potassium of 4.7 today.  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 -Continue O2, wean as tolerated, Continue duo nebs, Singulair, Pulmicort Continue IV diuresis for now.  Not much compliant with CPAP.  Diabetes mellitus type 2, uncontrolled with CKD -CBGs stable, continue Lantus, sliding scale insulin  Accelerated hypertension.  Unable to use beta blockers due to COPD.  On amlodipine.  Hydralazine dose has been increased by cardiology.   Elevated troponin -Likely due to demand ischemia with CHF, CKD.  No chest pain or abnormal EKG.  Pressure injury wounds, POA -Bilateral heel pressure injuries, stage III on right, Unstageable left foot, full-thickness tissue loss, stage II left hip ulcer.  We will continue  Anemia of chronic kidney disease.  Hemoglobin 8.6.  No evidence of bleeding.  Code Status: Full code  DVT Prophylaxis: Heparin subcu  Family Communication: I spoke with the patient's friend and updated her about the clinical condition of the patient. She will speak with the niece about overall goals of care in the future.   Disposition Plan: Recommendation is skilled nursing facility after medical stabilization.  Follow cardiology recommendations.  Likely disposition in  1 to 2 days if she continues to improve.  Overall prognosis of the patient is poor.  Procedures:  2D echo  Consultants:   Cardiology  Antimicrobials:   Anti-infectives (From admission, onward)   None      Subjective:   Patient slightly somnolent but able to communicate and interact.  She overall feels little better with breathing.  Objective:   Vitals:   09/08/18 2119 09/09/18 0100 09/09/18 0456 09/09/18 0807  BP: (!) 172/69 (!) 165/69 (!) 174/68   Pulse: 67 72 68   Resp: 20  20   Temp: 98.8 F (37.1 C)  97.9 F (36.6 C)   TempSrc:      SpO2:  99%  98% 97%  Weight:   79.3 kg   Height:        Intake/Output Summary (Last 24 hours) at 09/09/2018 1106 Last data filed at 09/09/2018 0645 Gross per 24 hour  Intake 120 ml  Output 1800 ml  Net -1680 ml     Wt Readings from Last 3 Encounters:  09/09/18 79.3 kg  08/02/18 81.6 kg  07/10/18 72 kg   Body mass index is 29.1 kg/m.   Physical Exam General:  Average built, not in obvious distress.  On nasal cannula. HENT: Normocephalic, pupils equally reacting to light and accommodation.  No scleral pallor or icterus noted. Oral mucosa is moist.  Elevated JVD. Chest:    Diminished breath sounds bilaterally.   CVS: S1 &S2 heard.  Systolic murmur noted..  Regular rate and rhythm. Abdomen: Soft, nontender, nondistended.  Bowel sounds are heard.  Liver is not palpable, no abdominal mass palpated Extremities: Edema has improved.  Left heel pressure ulceration, right heel pressure ulceration Psych: Alert, awake and communicative. CNS: Mildly somnolent.  Moves all extremities. Skin: Pressure ulcerations   Medications  Scheduled Meds: . amLODipine  10 mg Oral Daily  . budesonide (PULMICORT) nebulizer solution  0.25 mg Nebulization BID  . chlorhexidine  15 mL Mouth Rinse BID  . Chlorhexidine Gluconate Cloth  6 each Topical Q0600  . docusate sodium  100 mg Oral BID  . feeding supplement (ENSURE ENLIVE)  237 mL Oral BID BM  . feeding supplement (PRO-STAT SUGAR FREE 64)  30 mL Oral BID  . folic acid  0.5 mg Oral Daily  . furosemide  80 mg Intravenous Q12H  . heparin  5,000 Units Subcutaneous Q8H  . hydrALAZINE  25 mg Oral Q8H  . insulin aspart  0-15 Units Subcutaneous TID WC  . insulin aspart  0-5 Units Subcutaneous QHS  . insulin detemir  10 Units Subcutaneous QHS  . ipratropium-albuterol  3 mL Nebulization TID  . iron polysaccharides  150 mg Oral BID  . mouth rinse  15 mL Mouth Rinse q12n4p  . montelukast  10 mg Oral QHS  . multivitamin  1 tablet Oral Daily  . mupirocin ointment   1 application Nasal BID  . omega-3 acid ethyl esters  1 g Oral Daily  . pantoprazole  40 mg Oral Q0600  . pyridOXINE  100 mg Oral Daily  . senna  1 tablet Oral BID  . simvastatin  20 mg Oral Daily  . traZODone  50 mg Oral QHS  . vitamin B-12  1,000 mcg Oral Daily  . vitamin E  400 Units Oral Daily   Continuous Infusions: PRN Meds:.acetaminophen **OR** acetaminophen, cyclobenzaprine, oxyCODONE-acetaminophen   Data Reviewed:  I have personally reviewed following labs and imaging studies  Micro Results Recent Results (from the past  240 hour(s))  Blood culture (routine x 2)     Status: None   Collection Time: 09/01/18  3:59 PM   Specimen: BLOOD RIGHT HAND  Result Value Ref Range Status   Specimen Description BLOOD RIGHT HAND  Final   Special Requests   Final    BOTTLES DRAWN AEROBIC AND ANAEROBIC Blood Culture results may not be optimal due to an inadequate volume of blood received in culture bottles   Culture   Final    NO GROWTH 5 DAYS Performed at [facility], [place] [address], [place], [place] [address]    Report Status 09/06/2018 FINAL  Final  Blood culture (routine x 2)     Status: None   Collection Time: 09/01/18  4:06 PM   Specimen: BLOOD LEFT FOREARM  Result Value Ref Range Status   Specimen Description BLOOD LEFT FOREARM  Final   Special Requests   Final    BOTTLES DRAWN AEROBIC AND ANAEROBIC Blood Culture results may not be optimal due to an inadequate volume of blood received in culture bottles   Culture   Final    NO GROWTH 5 DAYS Performed at [facility], [place] [address], [place], [place] [address]    Report Status 09/06/2018 FINAL  Final  SARS CORONAVIRUS 2 Nasal Swab Aptima Multi Swab     Status: None   Collection Time: 09/03/18  6:33 AM   Specimen: Aptima Multi Swab; Nasal Swab  Result Value Ref Range Status   SARS Coronavirus 2 NEGATIVE NEGATIVE Final    Comment: (NOTE) SARS-CoV-2 target nucleic acids are NOT DETECTED. The SARS-CoV-2 RNA is  generally detectable in upper and lower respiratory specimens during the acute phase of infection. Negative results do not preclude SARS-CoV-2 infection, do not rule out co-infections with other pathogens, and should not be used as the sole basis for treatment or other patient management decisions. Negative results must be combined with clinical observations, patient history, and epidemiological information. The expected result is Negative. Fact Sheet for Patients: SugarRoll.be Fact Sheet for Healthcare Providers: https://www.woods-mathews.com/ This test is not yet approved or cleared by the [place] FDA and  has been authorized for detection and/or diagnosis of SARS-CoV-2 by FDA under an Emergency Use Authorization (EUA). This EUA will remain  in effect (meaning this test can be used) for the duration of the COVID-19 declaration under Section 56 4(b)(1) of the Act, 21 U.S.C. section 360bbb-3(b)(1), unless the authorization is terminated or revoked sooner. Performed at [facility], [place] [address], [place], [place] [address]   MRSA PCR Screening     Status: Abnormal   Collection Time: 09/03/18  8:37 PM   Specimen: Nasal Mucosa; Nasopharyngeal  Result Value Ref Range Status   MRSA by PCR POSITIVE (A) NEGATIVE Final    Comment:        The GeneXpert MRSA Assay (FDA approved for NASAL specimens only), is one component of a comprehensive MRSA colonization surveillance program. It is not intended to diagnose MRSA infection nor to guide or monitor treatment for MRSA infections. RESULT CALLED TO, READ BACK BY AND VERIFIED WITH: STEFFENS,M @ 0447 ON [id number] BY POTEAT,S Performed at [facility] [address] [place],  [address]     Radiology Reports Dg Chest 2 View  Result Date: 09/01/2018 CLINICAL DATA:  Chills and cough. EXAM: CHEST - 2 VIEW COMPARISON:  08/01/2018 FINDINGS: Low volumes. The cardio  pericardial silhouette is enlarged. Pulmonary vascular congestion noted with  interstitial pulmonary edema pattern. No substantial pleural effusion. The visualized bony structures of the thorax are intact. Degenerative changes noted left shoulder. IMPRESSION: Cardiomegaly with interstitial pulmonary edema. Electronically Signed   By: Misty Stanley M.D.   On: 09/01/2018 16:12   Dg Chest Port 1 View  Result Date: 09/07/2018 CLINICAL DATA:  Dyspnea. EXAM: PORTABLE CHEST 1 VIEW COMPARISON:  Chest x-ray dated September 03, 2018. FINDINGS: Unchanged cardiomegaly. Diffuse interstitial and hazy airspace opacities have mildly worsened. No pleural effusion or pneumothorax. No acute osseous abnormality. IMPRESSION: 1. Worsening pulmonary edema. Electronically Signed   By: Titus Dubin M.D.   On: 09/07/2018 13:51   Dg Chest Port 1 View  Result Date: 09/03/2018 CLINICAL DATA:  Shortness of breath and wheezing EXAM: PORTABLE CHEST 1 VIEW COMPARISON:  Two days ago FINDINGS: Cardiomegaly with perihilar vascular congestion. Lung volumes are low. No effusion, Kerley lines, or pneumothorax. IMPRESSION: Cardiomegaly and vascular congestion with possible perihilar edema. Electronically Signed   By: Monte Fantasia M.D.   On: 09/03/2018 04:14   Vas [place] Upper Extremity Venous Duplex  Result Date: 09/04/2018 UPPER VENOUS STUDY  Indications: Edema Left forearm and arm Limitations: Body habitus and forearm edema. Comparison Study: No previous study available for comparison Performing Technologist: Toma Copier RVS  Examination Guidelines: A complete evaluation includes B-mode imaging, spectral Doppler, color Doppler, and power Doppler as needed of all accessible portions of each vessel. Bilateral testing is considered an integral part of a complete examination. Limited examinations for reoccurring indications may be performed as noted.  Right Findings: +----------+------------+---------+-----------+----------+-------+ RIGHT      CompressiblePhasicitySpontaneousPropertiesSummary +----------+------------+---------+-----------+----------+-------+ Subclavian    Full       Yes       Yes                      +----------+------------+---------+-----------+----------+-------+  Left Findings: +----------+------------+---------+-----------+----------+---------------------+ LEFT      CompressiblePhasicitySpontaneousProperties       Summary        +----------+------------+---------+-----------+----------+---------------------+ IJV           Full       Yes       Yes                                    +----------+------------+---------+-----------+----------+---------------------+ Subclavian    Full       Yes       Yes                                    +----------+------------+---------+-----------+----------+---------------------+ Axillary      Full       Yes       Yes                                    +----------+------------+---------+-----------+----------+---------------------+ Brachial      Full       Yes       Yes                                    +----------+------------+---------+-----------+----------+---------------------+ Radial        Full                                                        +----------+------------+---------+-----------+----------+---------------------+  Ulnar         Full                                                        +----------+------------+---------+-----------+----------+---------------------+ Cephalic    Partial                                 Acute in the [facility] into                                                        the mid upper arm   +----------+------------+---------+-----------+----------+---------------------+ Basilic                                              Unable to visualize                                                       the forearm due to                                                        edema. The upper arm                                                           is negative      +----------+------------+---------+-----------+----------+---------------------+  Summary:  Right: No evidence of thrombosis in the subclavian.  Left: No evidence of deep vein thrombosis in the upper extremity. No evidence of thrombosis in the subclavian. Findings consistent with acute superficial vein thrombosis involving the left cephalic vein.  *See table(s) above for measurements and observations.  Diagnosing physician: Deitra Mayo MD Electronically signed by Deitra Mayo MD on 09/04/2018 at 1:22:51 PM.    Final     Lab Data:  CBC: Recent Labs  Lab 09/03/18 0429 09/04/18 0449 09/07/18 0338 09/08/18 0419 09/09/18 0511  WBC 7.7 7.6 6.2 6.5 6.7  NEUTROABS 5.6  --   --   --   --   HGB 9.1* 8.7* 8.3* 8.1* 8.6*  HCT 30.3* 29.7* 28.4* 27.1* 29.1*  MCV 98.4 99.3 97.6 97.1 97.3  PLT 205 208 218 222 626   Basic Metabolic Panel: Recent Labs  Lab 09/05/18 0448 09/06/18 0359 09/07/18 0338 09/08/18 0419 09/09/18 0511  NA 139 139 141 140 140  K 4.4 4.7 4.6 5.0 4.7  CL 103 102 103 100 99  CO2 28 23 29 29  33*  GLUCOSE 213* 218* 137* 126* 161*  BUN 91* 95* 96* 97* 100*  CREATININE 2.41* 2.59* 2.32* 2.23* 2.30*  CALCIUM 8.5* 8.4* 8.8* 8.7* 9.0   GFR: Estimated Creatinine Clearance: 21.6 mL/min (A) (by C-G formula based on SCr of 2.3 mg/dL (H)). Liver Function Tests: No results for input(s): AST, ALT, ALKPHOS, BILITOT, PROT, ALBUMIN in the last 168 hours. No results for input(s): LIPASE, AMYLASE in the last 168 hours. No results for input(s): AMMONIA in the last 168 hours. Coagulation Profile: No results for input(s): INR, PROTIME in the last 168 hours. Cardiac Enzymes: No results for input(s): CKTOTAL, CKMB, CKMBINDEX, TROPONINI in the last 168 hours. BNP (last 3 results) No results for input(s): PROBNP in the last 8760 hours. HbA1C: No results for input(s):  HGBA1C in the last 72 hours. CBG: Recent Labs  Lab 09/08/18 0743 09/08/18 1111 09/08/18 1636 09/08/18 2114 09/09/18 0745  GLUCAP 103* 152* 163* 147* 112*   Lipid Profile: No results for input(s): CHOL, HDL, LDLCALC, TRIG, CHOLHDL, LDLDIRECT in the last 72 hours. Thyroid Function Tests: No results for input(s): TSH, T4TOTAL, FREET4, T3FREE, THYROIDAB in the last 72 hours. Anemia Panel: No results for input(s): VITAMINB12, FOLATE, FERRITIN, TIBC, IRON, RETICCTPCT in the last 72 hours. Urine analysis:    Component Value Date/Time   COLORURINE STRAW (A) 07/23/2018 1656   APPEARANCEUR CLEAR 07/23/2018 1656   LABSPEC 1.011 07/23/2018 1656   PHURINE 5.0 07/23/2018 1656   GLUCOSEU NEGATIVE 07/23/2018 1656   HGBUR NEGATIVE 07/23/2018 1656   BILIRUBINUR NEGATIVE 07/23/2018 1656   KETONESUR NEGATIVE 07/23/2018 1656   PROTEINUR NEGATIVE 07/23/2018 1656   UROBILINOGEN 0.2 12/16/2011 1717   NITRITE NEGATIVE 07/23/2018 1656   LEUKOCYTESUR NEGATIVE 07/23/2018 1656     Zahirah Cheslock M.D. [facility] 09/09/2018, 11:06 AM

## 2018-09-09 NOTE — Progress Notes (Signed)
Progress Note  Patient Name: [name] [facility] Date of Encounter: 09/09/2018  Primary Cardiologist: No primary care provider on file. [place]  Subjective   Slightly more interactive today, but still appears sleepy. Able to tell me she is at [facility] and asked me to leave the door open when I left, so appears to be oriented. Still tachypneic. Calf edema virtually gone. Excellent UO, net -6.3L since admission. Reportedly weight down 14.5 lb overnight, must be in error. BP high despite increased hydralazine. Renal function unchanged, Creat 2.3, BUN 100.  Stable moderate anemia.  Inpatient Medications    Scheduled Meds: . amLODipine  10 mg Oral Daily  . budesonide (PULMICORT) nebulizer solution  0.25 mg Nebulization BID  . chlorhexidine  15 mL Mouth Rinse BID  . docusate sodium  100 mg Oral BID  . feeding supplement (ENSURE ENLIVE)  237 mL Oral BID BM  . feeding supplement (PRO-STAT SUGAR FREE 64)  30 mL Oral BID  . folic acid  0.5 mg Oral Daily  . furosemide  80 mg Intravenous Q12H  . heparin  5,000 Units Subcutaneous Q8H  . hydrALAZINE  25 mg Oral Q8H  . insulin aspart  0-15 Units Subcutaneous TID WC  . insulin aspart  0-5 Units Subcutaneous QHS  . insulin detemir  10 Units Subcutaneous QHS  . ipratropium-albuterol  3 mL Nebulization TID  . iron polysaccharides  150 mg Oral BID  . mouth rinse  15 mL Mouth Rinse q12n4p  . montelukast  10 mg Oral QHS  . multivitamin  1 tablet Oral Daily  . omega-3 acid ethyl esters  1 g Oral Daily  . pantoprazole  40 mg Oral Q0600  . pyridOXINE  100 mg Oral Daily  . senna  1 tablet Oral BID  . simvastatin  20 mg Oral Daily  . traZODone  50 mg Oral QHS  . vitamin B-12  1,000 mcg Oral Daily  . vitamin E  400 Units Oral Daily   Continuous Infusions:  PRN Meds: acetaminophen **OR** acetaminophen, alum & mag hydroxide-simeth, cyclobenzaprine, oxyCODONE-acetaminophen   Vital Signs    Vitals:   09/08/18 2011 09/08/18  2119 09/09/18 0100 09/09/18 0456  BP:  (!) 172/69 (!) 165/69 (!) 174/68  Pulse:  67 72 68  Resp:  20  20  Temp:  98.8 F (37.1 C)  97.9 F (36.6 C)  TempSrc:      SpO2: (!) 87% 99%  98%  Weight:    79.3 kg  Height:        Intake/Output Summary (Last 24 hours) at 09/09/2018 0812 Last data filed at 09/09/2018 0645 Gross per 24 hour  Intake 340 ml  Output 1800 ml  Net -1460 ml   Last 3 Weights 09/09/2018 09/08/2018 09/05/2018  Weight (lbs) 174 lb 14.4 oz 188 lb 7.9 oz 192 lb 10.9 oz  Weight (kg) 79.334 kg 85.5 kg 87.4 kg      Telemetry    NSR - Personally Reviewed  ECG    No new tracing - Personally Reviewed  Physical Exam  Stuporous, but easy to wake. Tachypneic GEN: No acute distress.   Neck: marked JVD Cardiac: RRR, 3/6 systolic murmurs at the RUSB (louder) and LUSB, no diastolic murmurs, rubs, or gallops.  Respiratory: diminished throughout, faint wheezes. GI: Soft, nontender, non-distended  MS: No edema; No deformity. Neuro:  Nonfocal, but groggy Psych: Normal affect   Labs    High Sensitivity Troponin:   Recent Labs  Lab 09/03/18  0345 09/03/18 0600  TROPONINIHS 75* 72*      Chemistry Recent Labs  Lab 09/07/18 0338 09/08/18 0419 09/09/18 0511  NA 141 140 140  K 4.6 5.0 4.7  CL 103 100 99  CO2 29 29 33*  GLUCOSE 137* 126* 161*  BUN 96* 97* 100*  CREATININE 2.32* 2.23* 2.30*  CALCIUM 8.8* 8.7* 9.0  GFRNONAA 20* 21* 20*  GFRAA 23* 24* 23*  ANIONGAP 9 11 8      Hematology Recent Labs  Lab 09/07/18 0338 09/08/18 0419 09/09/18 0511  WBC 6.2 6.5 6.7  RBC 2.91* 2.79* 2.99*  HGB 8.3* 8.1* 8.6*  HCT 28.4* 27.1* 29.1*  MCV 97.6 97.1 97.3  MCH 28.5 29.0 28.8  MCHC 29.2* 29.9* 29.6*  RDW 17.4* 17.4* 17.6*  PLT 218 222 220    BNP Recent Labs  Lab 09/03/18 0429  BNP 696.0*     DDimer No results for input(s): DDIMER in the last 168 hours.   Radiology    Dg Chest Port 1 View  Result Date: 09/07/2018 CLINICAL DATA:  Dyspnea. EXAM:  PORTABLE CHEST 1 VIEW COMPARISON:  Chest x-ray dated September 03, 2018. FINDINGS: Unchanged cardiomegaly. Diffuse interstitial and hazy airspace opacities have mildly worsened. No pleural effusion or pneumothorax. No acute osseous abnormality. IMPRESSION: 1. Worsening pulmonary edema. Electronically Signed   By: Titus Dubin M.D.   On: 09/07/2018 13:51    Cardiac Studies    Echo June 19, 2018  1. The left ventricle has hyperdynamic systolic function, with an ejection fraction of >65%. The cavity size was normal. There is mildly increased left ventricular wall thickness. Left ventricular diastolic Doppler parameters are consistent with  pseudonormalization. Elevated left atrial and left ventricular end-diastolic pressures No evidence of left ventricular regional wall motion abnormalities. 2. The right ventricle has normal systolic function. The cavity was mildly enlarged. There is no increase in right ventricular wall thickness. Right ventricular systolic pressure is severely elevated with an estimated pressure of 79.2 mmHg. 3. Left atrial size was severely dilated. 4. Right atrial size was moderately dilated. 5. Tricuspid valve regurgitation is mild-moderate. 6. The aortic valve is tricuspid. Severely thickening of the aortic valve. Severe calcifcation of the aortic valve. Aortic valve regurgitation is mild by color flow Doppler. Moderate stenosis of the aortic valve. AV Mean Grad: 25.0 mmHg. AV Area (VTI):  1.54 cm. AV Vmax: 365.50 cm/s 7. The inferior vena cava was dilated in size with >50% respiratory variability.  Patient Profile     78 y.o. female with acute on chronic respiratory failure/hypoxia, longstanding severe COPD, chronic pulmonary artery hypertension, acute on chronic diastolic heart failure moderate aortic stenosis, mitral insufficiency with varying severity, CKD stage IV, DM  Assessment & Plan    1.CHF: Good diuresis, stable renal parameters. She will continue to have  JVD due to PAH/RHF even when she is adequately diuresed. Heart failure findings precede aortic stenosis.  It is not clear to what degree her mitral insufficiency is contributing to her heart failure.   2. AS:  Moderate. On echo, gradients exaggerated by markedly hyperdynamic LVEF 3. MR: reviewed multiple echos. In my opinion, MR severity was underestimated on the last echo. MR appears moderate, but is definitely not severe (systolic dominant pulmonary vein flow).  2. PAH: Severe pulmonary hypertension also predates the progression of aortic stenosis. There is marked RV hypertrophy on echo.  Unclear to what degree it is related to chronic lung disease and left heart failure. 3.COPD: Prior to admission was  already on home oxygen. Remains tachypneic. 4.OSA: Very likely contributing to the pulmonary artery hypertension.  She is reported to not be compliant with CPAP.  I wonder if she also has obesity hypoventilation sd.  She has not seen her pulmonary specialist in over a year.  Her blood gas has not been performed during this admission. 5.CKD 4: stable creatinine, BUN gently rising 6. DM: On insulin.  Increases the likelihood that she has coronary disease.  She is not a candidate for invasive or contrast based studies. 7.  Elevated troponin: Flat pattern, not consistent with acute coronary syndrome.  She denies angina.  Although she has high likelihood of CAD, no reason for aggressive intervention at this time. 8. Anemia: Chronic.  Normochromic, normal ferritin and B12 levels in the recent past seems to be at baseline.  Note that for a patient with chronic hypoxemia, COPD and heart failure her degree of anemia is actually severe.  This will make it harder to treat her heart failure.  She might need EPO. 9.HTN: Blood pressure remains elevated. Very hyperdynamic LV, would benefit from beta blockers, but COPD precludes their use. On max dose amlodipine.  Will further increase the dose of hydralazine.  Choices of  antihypertensive agents severely limited by the presence of advanced kidney disease and reactive airway disease.  Overall long-term prognosis appears to be very poor due to the severity of underlying respiratory problems and pulmonary hypertension.     For questions or updates, please contact [place] Please consult [email or web address] for contact info under        Signed, Sanda Klein, MD  09/09/2018, 8:12 AM

## 2018-09-10 ENCOUNTER — Inpatient Hospital Stay (HOSPITAL_COMMUNITY): Payer: Medicare Other

## 2018-09-10 LAB — TROPONIN I (HIGH SENSITIVITY): Troponin I (High Sensitivity): 43 ng/L — ABNORMAL HIGH (ref <18)

## 2018-09-10 LAB — BASIC METABOLIC PANEL
Anion gap: 9 (ref 5–15)
BUN: 108 mg/dL — ABNORMAL HIGH (ref 8–23)
CO2: 32 mmol/L (ref 22–32)
Calcium: 8.9 mg/dL (ref 8.9–10.3)
Chloride: 97 mmol/L — ABNORMAL LOW (ref 98–111)
Creatinine, Ser: 2.49 mg/dL — ABNORMAL HIGH (ref 0.44–1.00)
GFR calc Af Amer: 21 mL/min — ABNORMAL LOW (ref >60)
GFR calc non Af Amer: 18 mL/min — ABNORMAL LOW (ref >60)
Glucose, Bld: 149 mg/dL — ABNORMAL HIGH (ref 70–99)
Potassium: 5.3 mmol/L — ABNORMAL HIGH (ref 3.5–5.1)
Sodium: 138 mmol/L (ref 135–145)

## 2018-09-10 LAB — CBC
HCT: 30.3 % — ABNORMAL LOW (ref 36.0–46.0)
Hemoglobin: 9.2 g/dL — ABNORMAL LOW (ref 12.0–15.0)
MCH: 29 pg (ref 26.0–34.0)
MCHC: 30.4 g/dL (ref 30.0–36.0)
MCV: 95.6 fL (ref 80.0–100.0)
Platelets: 228 10*3/uL (ref 150–400)
RBC: 3.17 MIL/uL — ABNORMAL LOW (ref 3.87–5.11)
RDW: 17.3 % — ABNORMAL HIGH (ref 11.5–15.5)
WBC: 7.4 10*3/uL (ref 4.0–10.5)
nRBC: 0 % (ref 0.0–0.2)

## 2018-09-10 LAB — SARS CORONAVIRUS 2 BY RT PCR (HOSPITAL ORDER, PERFORMED IN ~~LOC~~ HOSPITAL LAB): SARS Coronavirus 2: NEGATIVE

## 2018-09-10 LAB — GLUCOSE, CAPILLARY
Glucose-Capillary: 139 mg/dL — ABNORMAL HIGH (ref 70–99)
Glucose-Capillary: 236 mg/dL — ABNORMAL HIGH (ref 70–99)
Glucose-Capillary: 176 mg/dL — ABNORMAL HIGH (ref 70–99)
Glucose-Capillary: 242 mg/dL — ABNORMAL HIGH (ref 70–99)

## 2018-09-10 MED ORDER — FUROSEMIDE 10 MG/ML IJ SOLN
40.0000 mg | Freq: Once | INTRAMUSCULAR | Status: AC
  Administered 2018-09-10: 23:00:00 40 mg via INTRAVENOUS
  Filled 2018-09-10: qty 4

## 2018-09-10 MED ORDER — NITROGLYCERIN 2 % TD OINT
1.0000 [in_us] | TOPICAL_OINTMENT | Freq: Three times a day (TID) | TRANSDERMAL | Status: DC
  Administered 2018-09-10 – 2018-09-15 (×12): 1 [in_us] via TOPICAL
  Filled 2018-09-10: qty 30

## 2018-09-10 NOTE — Care Management Important Message (Signed)
Important Message  Patient Details IM Letter given to [name] SW to present to the Patient Name: [name] MRN: [id number] Date of Birth: [birth year]   Medicare Important Message Given:  Yes     Kerin Salen 09/10/2018, 11:51 AM

## 2018-09-10 NOTE — Progress Notes (Signed)
[facility]                                                                              [name], is a 78 y.o. female, DOB - February 26, 1940, ATF:[id number]  Admit date - 09/03/2018   Admitting Physician Mir Marry Guan, MD  Outpatient Primary MD for the patient is Garwin Brothers, MD  Outpatient specialists:   LOS - 7  days   Brief summary   Patient is a [name] with combined systolic and diastolic CHF (recent 2D echo with normal EF), type 2 diabetes mellitus, COPD with chronic respiratory failure on nocturnal home O2, anemia, obstructive sleep apnea, history of peptic ulcer disease, hypertension and hyperlipidemia presented to the hospital with increased breathing for past few days.  She was evaluated at [facility], ED today prior to admission for shortness of breath and discharged back.  She was noted to be hypoxic with O2 sat of 88% on 4 L nasal cannula.  EMS noted patient had wheezing and rales and treated with epinephrine and nitroglycerin. She was found tachypneic and hypoxic with acute on chronic combined systolic and diastolic CHF with chest x-ray showing pulmonary vascular congestion.  Also noted for acute on chronic kidney disease and hyperkalemia.  Patient was given Lasix 80 mg IV x1 and admitted for further work-up and treatment. COVID-19 test negative  Assessment & Plan  : Acute on chronic respiratory failure with hypoxia likely secondary to acute on chronic combined systolic and diastolic CHF (congestive heart failure). Cardiology was consulted.  On Lasix 80 mg twice a day.  On torsemide as outpatient.  Having good diuresis.  Cardiology following.  And is currently on 5 L of nasal cannula.  We will continue to wean to titrate oxygen between 88-92.  Spoke with the nursing staff about it.  Moderate aortic stenosis, mitral regurgitation with severe pulmonary hypertension. Overall poor prognosis   Acute kidney injury on CKD stage IV  -Possibility of cardiorenal syndrome with acute CHF. [facility] outpatient,-Creatinine stable, 2.3>2.2>2.3>2.4 on IV Lasix.  Hyperkalemia -Borderline high at 5.3 today.  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  Now needing diurnal oxygen -Continue O2, wean as tolerated, Continue duo nebs, Singulair, Pulmicort Continue IV diuresis for now.  Not much compliant with CPAP.  Diabetes mellitus type 2, uncontrolled with CKD -CBGs stable, continue Lantus, sliding scale insulin  Accelerated hypertension.  Unable to use beta blockers due to COPD.  On amlodipine.  Hydralazine dose has been increased by cardiology.  Unable to use ACE inhibitor's as well.   Elevated troponin -Likely due to demand ischemia with CHF, CKD.  No chest pain or abnormal EKG.  Pressure injury wounds, POA -Bilateral heel pressure injuries, stage III on right, Unstageable left foot, full-thickness tissue loss, stage II left hip ulcer.  We will continue  Anemia of chronic kidney disease.  Hemoglobin 8.6.  No evidence of bleeding.  Code Status: Full code  DVT Prophylaxis: Heparin subcu  Family Communication: I spoke with the patient's friend and niece about  the goals of care and the clinical condition of the patient yesterday.  Disposition Plan: skilled nursing facility after medical stabilization.  Continue to wean oxygen down.  Overall prognosis of the patient is poor.  Likely disposition to skilled nursing facility in a.m if able to wean the oxygen down and renal function remained stable.  I have encouraged the niece and the friend to talk about palliative care/hospice at the skilled nursing facility if she continues to decline.  Procedures:  2D echo  Consultants:   Cardiology  Antimicrobials:   Anti-infectives (From admission, onward)   None      Subjective:   Patient  complains of shortness of breath.  Elevated blood pressure.  Denies any chest pain, palpitation, fever, chills or rigor  Objective:   Vitals:   09/10/18 0522 09/10/18 0745 09/10/18 1030 09/10/18 1329  BP: (!) 160/66  (!) 158/64 (!) 176/68  Pulse: 67   62  Resp:    (!) 22  Temp: 97.6 F (36.4 C)     TempSrc: Oral     SpO2: 93% 92%  93%  Weight: 79.3 kg     Height:        Intake/Output Summary (Last 24 hours) at 09/10/2018 1502 Last data filed at 09/10/2018 1300 Gross per 24 hour  Intake 510 ml  Output 700 ml  Net -190 ml     Wt Readings from Last 3 Encounters:  09/10/18 79.3 kg  08/02/18 81.6 kg  07/10/18 72 kg   Body mass index is 29.09 kg/m.   Physical Exam General:  overweight, not in obvious distress.  On nasal cannula- 5 L/min HENT: Normocephalic, pupils equally reacting to light and accommodation.  No scleral pallor or icterus noted. Oral mucosa is moist.  Stented neck veins. Chest: Diminished breath sounds bilaterally.  CVS: S1 &S2 heard.  Systolic murmur noted.  Regular rate and rhythm. Abdomen: Soft, nontender, nondistended.  Bowel sounds are heard.   Extremities: Bilateral heel pressure ulceration present on admission Psych: Alert, awake and oriented, normal mood CNS:  No cranial nerve deficits.  Moves extremities.  Skin:   Bilateral heel pressure ulceration present on admission  Medications  Scheduled Meds: . amLODipine  10 mg Oral Daily  . budesonide (PULMICORT) nebulizer solution  0.25 mg Nebulization BID  . chlorhexidine  15 mL Mouth Rinse BID  . Chlorhexidine Gluconate Cloth  6 each Topical Q0600  . docusate sodium  100 mg Oral BID  . feeding supplement (ENSURE ENLIVE)  237 mL Oral BID BM  . feeding supplement (PRO-STAT SUGAR FREE 64)  30 mL Oral BID  . folic acid  0.5 mg Oral Daily  . furosemide  80 mg Intravenous Q12H  . heparin  5,000 Units Subcutaneous Q8H  . hydrALAZINE  25 mg Oral Q8H  . insulin aspart  0-15 Units Subcutaneous TID WC  .  insulin aspart  0-5 Units Subcutaneous QHS  . insulin detemir  10 Units Subcutaneous QHS  . ipratropium-albuterol  3 mL Nebulization TID  . iron polysaccharides  150 mg Oral BID  . mouth rinse  15 mL Mouth Rinse q12n4p  . montelukast  10 mg Oral QHS  . multivitamin  1 tablet Oral Daily  . mupirocin ointment  1 application Nasal BID  . omega-3 acid ethyl esters  1 g Oral Daily  . pantoprazole  40 mg Oral Q0600  . pyridOXINE  100 mg Oral Daily  . senna  1 tablet Oral BID  . simvastatin  20  mg Oral Daily  . traZODone  50 mg Oral QHS  . vitamin B-12  1,000 mcg Oral Daily  . vitamin E  400 Units Oral Daily   Continuous Infusions: PRN Meds:.acetaminophen **OR** acetaminophen, cyclobenzaprine, oxyCODONE-acetaminophen   Data Reviewed:  I have personally reviewed following labs and imaging studies  Micro Results Recent Results (from the past 240 hour(s))  Blood culture (routine x 2)     Status: None   Collection Time: 09/01/18  3:59 PM   Specimen: BLOOD RIGHT HAND  Result Value Ref Range Status   Specimen Description BLOOD RIGHT HAND  Final   Special Requests   Final    BOTTLES DRAWN AEROBIC AND ANAEROBIC Blood Culture results may not be optimal due to an inadequate volume of blood received in culture bottles   Culture   Final    NO GROWTH 5 DAYS Performed at [facility], [place] [address], [place], [place] [address]    Report Status 09/06/2018 FINAL  Final  Blood culture (routine x 2)     Status: None   Collection Time: 09/01/18  4:06 PM   Specimen: BLOOD LEFT FOREARM  Result Value Ref Range Status   Specimen Description BLOOD LEFT FOREARM  Final   Special Requests   Final    BOTTLES DRAWN AEROBIC AND ANAEROBIC Blood Culture results may not be optimal due to an inadequate volume of blood received in culture bottles   Culture   Final    NO GROWTH 5 DAYS Performed at [facility], [place] [address] [place], [place] [address]    Report Status 09/06/2018 FINAL  Final   SARS CORONAVIRUS 2 Nasal Swab Aptima Multi Swab     Status: None   Collection Time: 09/03/18  6:33 AM   Specimen: Aptima Multi Swab; Nasal Swab  Result Value Ref Range Status   SARS Coronavirus 2 NEGATIVE NEGATIVE Final    Comment: (NOTE) SARS-CoV-2 target nucleic acids are NOT DETECTED. The SARS-CoV-2 RNA is generally detectable in upper and lower respiratory specimens during the acute phase of infection. Negative results do not preclude SARS-CoV-2 infection, do not rule out co-infections with other pathogens, and should not be used as the sole basis for treatment or other patient management decisions. Negative results must be combined with clinical observations, patient history, and epidemiological information. The expected result is Negative. Fact Sheet for Patients: SugarRoll.be Fact Sheet for Healthcare Providers: https://www.woods-mathews.com/ This test is not yet approved or cleared by the [place] FDA and  has been authorized for detection and/or diagnosis of SARS-CoV-2 by FDA under an Emergency Use Authorization (EUA). This EUA will remain  in effect (meaning this test can be used) for the duration of the COVID-19 declaration under Section 56 4(b)(1) of the Act, 21 U.S.C. section 360bbb-3(b)(1), unless the authorization is terminated or revoked sooner. Performed at [facility], [place] [address] [place], [place] [address]   MRSA PCR Screening     Status: Abnormal   Collection Time: 09/03/18  8:37 PM   Specimen: Nasal Mucosa; Nasopharyngeal  Result Value Ref Range Status   MRSA by PCR POSITIVE (A) NEGATIVE Final    Comment:        The GeneXpert MRSA Assay (FDA approved for NASAL specimens only), is one component of a comprehensive MRSA colonization surveillance program. It is not intended to diagnose MRSA infection nor to guide or monitor treatment for MRSA infections. RESULT CALLED TO, READ BACK BY AND VERIFIED  WITH: STEFFENS,M @  [place] [id number] BY POTEAT,S Performed at [facility], [place] [address] [place], [place] [address]     Radiology Reports Dg Chest 2 View  Result Date: 09/01/2018 CLINICAL DATA:  Chills and cough. EXAM: CHEST - 2 VIEW COMPARISON:  08/01/2018 FINDINGS: Low volumes. The cardio pericardial silhouette is enlarged. Pulmonary vascular congestion noted with interstitial pulmonary edema pattern. No substantial pleural effusion. The visualized bony structures of the thorax are intact. Degenerative changes noted left shoulder. IMPRESSION: Cardiomegaly with interstitial pulmonary edema. Electronically Signed   By: Misty Stanley M.D.   On: 09/01/2018 16:12   Dg Chest Port 1 View  Result Date: 09/07/2018 CLINICAL DATA:  Dyspnea. EXAM: PORTABLE CHEST 1 VIEW COMPARISON:  Chest x-ray dated September 03, 2018. FINDINGS: Unchanged cardiomegaly. Diffuse interstitial and hazy airspace opacities have mildly worsened. No pleural effusion or pneumothorax. No acute osseous abnormality. IMPRESSION: 1. Worsening pulmonary edema. Electronically Signed   By: Titus Dubin M.D.   On: 09/07/2018 13:51   Dg Chest Port 1 View  Result Date: 09/03/2018 CLINICAL DATA:  Shortness of breath and wheezing EXAM: PORTABLE CHEST 1 VIEW COMPARISON:  Two days ago FINDINGS: Cardiomegaly with perihilar vascular congestion. Lung volumes are low. No effusion, Kerley lines, or pneumothorax. IMPRESSION: Cardiomegaly and vascular congestion with possible perihilar edema. Electronically Signed   By: Monte Fantasia M.D.   On: 09/03/2018 04:14   Vas [place] Upper Extremity Venous Duplex  Result Date: 09/04/2018 UPPER VENOUS STUDY  Indications: Edema Left forearm and arm Limitations: Body habitus and forearm edema. Comparison Study: No previous study available for comparison Performing Technologist: Toma Copier RVS  Examination Guidelines: A complete evaluation includes B-mode imaging, spectral Doppler, color  Doppler, and power Doppler as needed of all accessible portions of each vessel. Bilateral testing is considered an integral part of a complete examination. Limited examinations for reoccurring indications may be performed as noted.  Right Findings: +----------+------------+---------+-----------+----------+-------+ RIGHT     CompressiblePhasicitySpontaneousPropertiesSummary +----------+------------+---------+-----------+----------+-------+ Subclavian    Full       Yes       Yes                      +----------+------------+---------+-----------+----------+-------+  Left Findings: +----------+------------+---------+-----------+----------+---------------------+ LEFT      CompressiblePhasicitySpontaneousProperties       Summary        +----------+------------+---------+-----------+----------+---------------------+ IJV           Full       Yes       Yes                                    +----------+------------+---------+-----------+----------+---------------------+ Subclavian    Full       Yes       Yes                                    +----------+------------+---------+-----------+----------+---------------------+ Axillary      Full       Yes       Yes                                    +----------+------------+---------+-----------+----------+---------------------+ Brachial      Full       Yes       Yes                                    +----------+------------+---------+-----------+----------+---------------------+  Radial        Full                                                        +----------+------------+---------+-----------+----------+---------------------+ Ulnar         Full                                                        +----------+------------+---------+-----------+----------+---------------------+ Cephalic    Partial                                 Acute in the [employer] into                                                         the mid upper arm   +----------+------------+---------+-----------+----------+---------------------+ Basilic                                              Unable to visualize                                                       the forearm due to                                                       edema. The upper arm                                                           is negative      +----------+------------+---------+-----------+----------+---------------------+  Summary:  Right: No evidence of thrombosis in the subclavian.  Left: No evidence of deep vein thrombosis in the upper extremity. No evidence of thrombosis in the subclavian. Findings consistent with acute superficial vein thrombosis involving the left cephalic vein.  *See table(s) above for measurements and observations.  Diagnosing physician: Deitra Mayo MD Electronically signed by Deitra Mayo MD on 09/04/2018 at 1:22:51 PM.    Final     Lab Data:  CBC: Recent Labs  Lab 09/04/18 0449 09/07/18 0338 09/08/18 0419 09/09/18 0511 09/10/18 0515  WBC 7.6 6.2 6.5 6.7 7.4  HGB 8.7* 8.3* 8.1* 8.6* 9.2*  HCT 29.7* 28.4* 27.1* 29.1* 30.3*  MCV 99.3 97.6 97.1 97.3 95.6  PLT 208 218 222 220 222   Basic Metabolic Panel:  Recent Labs  Lab 09/06/18 0359 09/07/18 0338 09/08/18 0419 09/09/18 0511 09/10/18 0515  NA 139 141 140 140 138  K 4.7 4.6 5.0 4.7 5.3*  CL 102 103 100 99 97*  CO2 23 29 29  33* 32  GLUCOSE 218* 137* 126* 161* 149*  BUN 95* 96* 97* 100* 108*  CREATININE 2.59* 2.32* 2.23* 2.30* 2.49*  CALCIUM 8.4* 8.8* 8.7* 9.0 8.9   GFR: Estimated Creatinine Clearance: 20 mL/min (A) (by C-G formula based on SCr of 2.49 mg/dL (H)). Liver Function Tests: No results for input(s): AST, ALT, ALKPHOS, BILITOT, PROT, ALBUMIN in the last 168 hours. No results for input(s): LIPASE, AMYLASE in the last 168 hours. No results for input(s): AMMONIA in the last 168 hours. Coagulation  Profile: No results for input(s): INR, PROTIME in the last 168 hours. Cardiac Enzymes: No results for input(s): CKTOTAL, CKMB, CKMBINDEX, TROPONINI in the last 168 hours. BNP (last 3 results) No results for input(s): PROBNP in the last 8760 hours. HbA1C: No results for input(s): HGBA1C in the last 72 hours. CBG: Recent Labs  Lab 09/09/18 1203 09/09/18 1623 09/09/18 2145 09/10/18 0751 09/10/18 1159  GLUCAP 146* 172* 164* 139* 236*   Lipid Profile: No results for input(s): CHOL, HDL, LDLCALC, TRIG, CHOLHDL, LDLDIRECT in the last 72 hours. Thyroid Function Tests: No results for input(s): TSH, T4TOTAL, FREET4, T3FREE, THYROIDAB in the last 72 hours. Anemia Panel: No results for input(s): VITAMINB12, FOLATE, FERRITIN, TIBC, IRON, RETICCTPCT in the last 72 hours. Urine analysis:    Component Value Date/Time   COLORURINE STRAW (A) 07/23/2018 1656   APPEARANCEUR CLEAR 07/23/2018 1656   LABSPEC 1.011 07/23/2018 1656   PHURINE 5.0 07/23/2018 1656   GLUCOSEU NEGATIVE 07/23/2018 1656   HGBUR NEGATIVE 07/23/2018 1656   BILIRUBINUR NEGATIVE 07/23/2018 1656   KETONESUR NEGATIVE 07/23/2018 1656   PROTEINUR NEGATIVE 07/23/2018 1656   UROBILINOGEN 0.2 12/16/2011 1717   NITRITE NEGATIVE 07/23/2018 1656   LEUKOCYTESUR NEGATIVE 07/23/2018 1656     Kassi Esteve M.D. [facility] 09/10/2018, 3:02 PM

## 2018-09-10 NOTE — Progress Notes (Signed)
Progress Note  Patient Name: [name] [facility] Date of Encounter: 09/10/2018  Primary Cardiologist: [name]  Subjective   She reports persistent shortness of breath.  BP remains elevated (150-160/60-70s).  UOP not recorded yesterday.  Mild bump in Scr 2.30 ->2.49.  Inpatient Medications    Scheduled Meds: . amLODipine  10 mg Oral Daily  . budesonide (PULMICORT) nebulizer solution  0.25 mg Nebulization BID  . chlorhexidine  15 mL Mouth Rinse BID  . docusate sodium  100 mg Oral BID  . feeding supplement (ENSURE ENLIVE)  237 mL Oral BID BM  . feeding supplement (PRO-STAT SUGAR FREE 64)  30 mL Oral BID  . folic acid  0.5 mg Oral Daily  . furosemide  80 mg Intravenous Q12H  . heparin  5,000 Units Subcutaneous Q8H  . hydrALAZINE  50 mg Oral Q8H  . insulin aspart  0-15 Units Subcutaneous TID WC  . insulin aspart  0-5 Units Subcutaneous QHS  . insulin detemir  10 Units Subcutaneous QHS  . ipratropium-albuterol  3 mL Nebulization TID  . iron polysaccharides  150 mg Oral BID  . mouth rinse  15 mL Mouth Rinse q12n4p  . montelukast  10 mg Oral QHS  . multivitamin  1 tablet Oral Daily  . omega-3 acid ethyl esters  1 g Oral Daily  . pantoprazole  40 mg Oral Q0600  . pyridOXINE  100 mg Oral Daily  . senna  1 tablet Oral BID  . simvastatin  20 mg Oral Daily  . traZODone  50 mg Oral QHS  . vitamin B-12  1,000 mcg Oral Daily  . vitamin E  400 Units Oral Daily   Continuous Infusions:  PRN Meds: acetaminophen **OR** acetaminophen, alum & mag hydroxide-simeth, cyclobenzaprine, oxyCODONE-acetaminophen   Vital Signs    Vitals:   09/09/18 2012 09/10/18 0522 09/10/18 0745 09/10/18 1030  BP:  (!) 160/66  (!) 158/64  Pulse:  67    Resp:      Temp:  97.6 F (36.4 C)    TempSrc:  Oral    SpO2: 93% 93% 92%   Weight:  79.3 kg    Height:        Intake/Output Summary (Last 24 hours) at 09/10/2018 1124 Last data filed at 09/10/2018 0900 Gross per 24  hour  Intake 510 ml  Output -  Net 510 ml   Last 3 Weights 09/10/2018 09/09/2018 09/08/2018  Weight (lbs) 174 lb 12.8 oz 174 lb 14.4 oz 188 lb 7.9 oz  Weight (kg) 79.289 kg 79.334 kg 85.5 kg      Telemetry    NSR with rate 70s- Personally Reviewed  ECG    No new tracing - Personally Reviewed  Physical Exam  Lying in bed at 30 degrees. Tachypneic GEN: No acute distress.   Neck: marked JVD Cardiac: RRR, 3/6 systolic murmur loudest at RUSB, no diastolic murmurs, rubs, or gallops.  Respiratory: diminished throughout, faint wheezes.  Increased work of breathing GI: Soft, nontender, non-distended  MS: No edema; No deformity. Neuro:  Nonfocal Psych: Normal affect   Labs    High Sensitivity Troponin:   Recent Labs  Lab 09/03/18 0345 09/03/18 0600  TROPONINIHS 75* 72*      Chemistry Recent Labs  Lab 09/08/18 0419 09/09/18 0511 09/10/18 0515  NA 140 140 138  K 5.0 4.7 5.3*  CL 100 99 97*  CO2 29 33* 32  GLUCOSE 126* 161* 149*  BUN 97* 100*  108*  CREATININE 2.23* 2.30* 2.49*  CALCIUM 8.7* 9.0 8.9  GFRNONAA 21* 20* 18*  GFRAA 24* 23* 21*  ANIONGAP 11 8 9      Hematology Recent Labs  Lab 09/08/18 0419 09/09/18 0511 09/10/18 0515  WBC 6.5 6.7 7.4  RBC 2.79* 2.99* 3.17*  HGB 8.1* 8.6* 9.2*  HCT 27.1* 29.1* 30.3*  MCV 97.1 97.3 95.6  MCH 29.0 28.8 29.0  MCHC 29.9* 29.6* 30.4  RDW 17.4* 17.6* 17.3*  PLT 222 220 228    BNP No results for input(s): BNP, PROBNP in the last 168 hours.   DDimer No results for input(s): DDIMER in the last 168 hours.   Radiology    No results found.  Cardiac Studies    Echo June 19, 2018  1. The left ventricle has hyperdynamic systolic function, with an ejection fraction of >65%. The cavity size was normal. There is mildly increased left ventricular wall thickness. Left ventricular diastolic Doppler parameters are consistent with  pseudonormalization. Elevated left atrial and left ventricular end-diastolic pressures No  evidence of left ventricular regional wall motion abnormalities. 2. The right ventricle has normal systolic function. The cavity was mildly enlarged. There is no increase in right ventricular wall thickness. Right ventricular systolic pressure is severely elevated with an estimated pressure of 79.2 mmHg. 3. Left atrial size was severely dilated. 4. Right atrial size was moderately dilated. 5. Tricuspid valve regurgitation is mild-moderate. 6. The aortic valve is tricuspid. Severely thickening of the aortic valve. Severe calcifcation of the aortic valve. Aortic valve regurgitation is mild by color flow Doppler. Moderate stenosis of the aortic valve. AV Mean Grad: 25.0 mmHg. AV Area (VTI):  1.54 cm. AV Vmax: 365.50 cm/s 7. The inferior vena cava was dilated in size with >50% respiratory variability.  Patient Profile     78 y.o. female with acute on chronic respiratory failure/hypoxia, longstanding severe COPD, chronic pulmonary artery hypertension, acute on chronic diastolic heart failure moderate aortic stenosis, mitral insufficiency with varying severity, CKD stage IV, DM  Assessment & Plan    Acute on chronic HFpEF: She will continue to have JVD due to PAH/RHF even when she is adequately diuresed. Heart failure findings precede aortic stenosis.  It is not clear to what degree her mitral insufficiency is contributing to her heart failure.  UOP not recorded yesterday, mild uptick in SCr.  Remains on 5 L nasal cannula.  Would continue diuresis.    AS:  Moderate. On echo, gradients exaggerated by markedly hyperdynamic LVEF  MR: moderate MR on last TTE  PAH: Severe pulmonary hypertension also predates the progression of aortic stenosis. There is marked RV hypertrophy on echo.  Unclear to what degree it is related to chronic lung disease and left heart failure.  COPD: Prior to admission was already on home oxygen. Remains tachypneic.  OSA: Very likely contributing to the pulmonary artery  hypertension.  She is reported to not be compliant with CPAP.  I wonder if she also has obesity hypoventilation sd.  She has not seen her pulmonary specialist in over a year.  Her blood gas has not been performed during this admission.  CKD 4: mild uptick in Scr today (2.30 ->2.49), continue to monitor   DM: On insulin.  Increases the likelihood that she has coronary disease.  She is not a candidate for invasive or contrast based studies.  Elevated troponin: Flat pattern, not consistent with acute coronary syndrome.  She denies angina.  Although she has high likelihood of CAD,  no reason for aggressive intervention at this time.  Anemia: Chronic.  Normochromic, normal ferritin and B12 levels in the recent past seems to be at baseline.  Note that for a patient with chronic hypoxemia, COPD and heart failure her degree of anemia is actually severe.  This will make it harder to treat her heart failure.  She might need EPO.  HTN: Blood pressure remains elevated. Very hyperdynamic LV, would benefit from beta blockers, but COPD precludes their use. On max dose amlodipine.  Hydralazine increased to 50 mg TID.  Choices of antihypertensive agents severely limited by the presence of advanced kidney disease and reactive airway disease.  Overall long-term prognosis appears to be very poor due to the severity of underlying respiratory problems and pulmonary hypertension.     For questions or updates, please contact [place] Please consult [email or web address] for contact info under        Signed, Donato Heinz, MD  09/10/2018, 11:24 AM

## 2018-09-10 NOTE — Progress Notes (Signed)
Per MD request to wean Oxygen down from 5 l [place]. Oxygen turned down to 3l  And O2 Saturations dropped to 79-85%. Oxygen increased to 4 l [place] with Oxygen Saturations with minimal increase to 85-87%. Oxygen placed back on 5 l [place] and Oxygen Saturations 92%. Monitor Pt closley

## 2018-09-10 NOTE — Progress Notes (Addendum)
RT contacted d/t pt having resp @ 40 on 5 LPM w/ High flow [place].  Pt has crackles and Rhonch throughout,  and was  Sating at 77%.Resp recommended paging on-call for CXR and maybe extra dose of Lasix. CPAP as well.  Dr. Maudie Mercury ordered a CPAP, 40 Mg Lasix, CXR, Blood Gas, Troponin and Nitroglycerin 2%- 1 in. After blood gas results came back the decision was made to move Pt to [place].  Niece Otelia Santee was called and updated on pts condition and transfer to [place]. ([phone])

## 2018-09-11 ENCOUNTER — Inpatient Hospital Stay (HOSPITAL_COMMUNITY): Payer: Medicare Other

## 2018-09-11 DIAGNOSIS — I340 Nonrheumatic mitral (valve) insufficiency: Secondary | ICD-10-CM

## 2018-09-11 DIAGNOSIS — Z515 Encounter for palliative care: Secondary | ICD-10-CM

## 2018-09-11 DIAGNOSIS — Z7189 Other specified counseling: Secondary | ICD-10-CM

## 2018-09-11 DIAGNOSIS — J9602 Acute respiratory failure with hypercapnia: Secondary | ICD-10-CM

## 2018-09-11 DIAGNOSIS — I361 Nonrheumatic tricuspid (valve) insufficiency: Secondary | ICD-10-CM

## 2018-09-11 DIAGNOSIS — J9601 Acute respiratory failure with hypoxia: Secondary | ICD-10-CM

## 2018-09-11 DIAGNOSIS — R579 Shock, unspecified: Secondary | ICD-10-CM

## 2018-09-11 LAB — GLUCOSE, CAPILLARY
Glucose-Capillary: 128 mg/dL — ABNORMAL HIGH (ref 70–99)
Glucose-Capillary: 72 mg/dL (ref 70–99)
Glucose-Capillary: 82 mg/dL (ref 70–99)
Glucose-Capillary: 88 mg/dL (ref 70–99)
Glucose-Capillary: 92 mg/dL (ref 70–99)

## 2018-09-11 LAB — CBC
HCT: 31.6 % — ABNORMAL LOW (ref 36.0–46.0)
Hemoglobin: 9.1 g/dL — ABNORMAL LOW (ref 12.0–15.0)
MCH: 28.5 pg (ref 26.0–34.0)
MCHC: 28.8 g/dL — ABNORMAL LOW (ref 30.0–36.0)
MCV: 99.1 fL (ref 80.0–100.0)
Platelets: 228 10*3/uL (ref 150–400)
RBC: 3.19 MIL/uL — ABNORMAL LOW (ref 3.87–5.11)
RDW: 17.3 % — ABNORMAL HIGH (ref 11.5–15.5)
WBC: 7.6 10*3/uL (ref 4.0–10.5)
nRBC: 0 % (ref 0.0–0.2)

## 2018-09-11 LAB — BASIC METABOLIC PANEL
Anion gap: 10 (ref 5–15)
BUN: 119 mg/dL — ABNORMAL HIGH (ref 8–23)
CO2: 32 mmol/L (ref 22–32)
Calcium: 9.2 mg/dL (ref 8.9–10.3)
Chloride: 98 mmol/L (ref 98–111)
Creatinine, Ser: 2.57 mg/dL — ABNORMAL HIGH (ref 0.44–1.00)
GFR calc Af Amer: 20 mL/min — ABNORMAL LOW (ref >60)
GFR calc non Af Amer: 17 mL/min — ABNORMAL LOW (ref >60)
Glucose, Bld: 217 mg/dL — ABNORMAL HIGH (ref 70–99)
Potassium: 5.5 mmol/L — ABNORMAL HIGH (ref 3.5–5.1)
Sodium: 140 mmol/L (ref 135–145)

## 2018-09-11 LAB — TROPONIN I (HIGH SENSITIVITY): Troponin I (High Sensitivity): 40 ng/L — ABNORMAL HIGH (ref <18)

## 2018-09-11 LAB — ECHOCARDIOGRAM COMPLETE
Height: 65 in
Weight: 2987.67 oz

## 2018-09-11 MED ORDER — NOREPINEPHRINE 4 MG/250ML-% IV SOLN
0.0000 ug/min | INTRAVENOUS | Status: DC
  Filled 2018-09-11: qty 250

## 2018-09-11 MED ORDER — FENTANYL CITRATE (PF) 100 MCG/2ML IJ SOLN
INTRAMUSCULAR | Status: AC
  Administered 2018-09-11: 100 ug
  Filled 2018-09-11: qty 2

## 2018-09-11 MED ORDER — HYDRALAZINE HCL 20 MG/ML IJ SOLN
5.0000 mg | INTRAMUSCULAR | Status: DC | PRN
  Administered 2018-09-11: 12:00:00 5 mg via INTRAVENOUS
  Filled 2018-09-11: qty 1

## 2018-09-11 MED ORDER — AMLODIPINE BESYLATE 10 MG PO TABS
10.0000 mg | ORAL_TABLET | Freq: Every day | ORAL | Status: DC
  Administered 2018-09-12 – 2018-09-14 (×3): 10 mg
  Filled 2018-09-11 (×3): qty 1

## 2018-09-11 MED ORDER — MIDAZOLAM HCL 2 MG/2ML IJ SOLN
INTRAMUSCULAR | Status: AC
  Administered 2018-09-11: 2 mg
  Filled 2018-09-11: qty 2

## 2018-09-11 MED ORDER — CHLORHEXIDINE GLUCONATE CLOTH 2 % EX PADS
6.0000 | MEDICATED_PAD | Freq: Every day | CUTANEOUS | Status: DC
  Administered 2018-09-11 – 2018-09-16 (×6): 6 via TOPICAL

## 2018-09-11 MED ORDER — ORAL CARE MOUTH RINSE
15.0000 mL | OROMUCOSAL | Status: DC
  Administered 2018-09-11 – 2018-09-14 (×26): 15 mL via OROMUCOSAL

## 2018-09-11 MED ORDER — NOREPINEPHRINE BITARTRATE 1 MG/ML IV SOLN
0.0000 ug/min | INTRAVENOUS | Status: DC
  Administered 2018-09-11: 2 ug/min via INTRAVENOUS
  Filled 2018-09-11 (×2): qty 4

## 2018-09-11 MED ORDER — CHLORHEXIDINE GLUCONATE 0.12% ORAL RINSE (MEDLINE KIT)
15.0000 mL | Freq: Two times a day (BID) | OROMUCOSAL | Status: DC
  Administered 2018-09-11 – 2018-09-13 (×6): 15 mL via OROMUCOSAL

## 2018-09-11 MED ORDER — ONDANSETRON HCL 4 MG/2ML IJ SOLN
INTRAMUSCULAR | Status: AC
  Filled 2018-09-11: qty 2

## 2018-09-11 MED ORDER — DEXTROSE-NACL 5-0.9 % IV SOLN
INTRAVENOUS | Status: DC
  Administered 2018-09-11: 23:00:00 via INTRAVENOUS

## 2018-09-11 MED ORDER — FENTANYL 2500MCG IN NS 250ML (10MCG/ML) PREMIX INFUSION
0.0000 ug/h | INTRAVENOUS | Status: DC
  Administered 2018-09-11: 25 ug/h via INTRAVENOUS
  Administered 2018-09-14: 50 ug/h via INTRAVENOUS
  Filled 2018-09-11 (×4): qty 250

## 2018-09-11 MED ORDER — DEXMEDETOMIDINE HCL IN NACL 200 MCG/50ML IV SOLN
0.4000 ug/kg/h | INTRAVENOUS | Status: DC
  Administered 2018-09-11 (×2): 0.4 ug/kg/h via INTRAVENOUS
  Filled 2018-09-11: qty 100
  Filled 2018-09-11 (×2): qty 50

## 2018-09-11 MED ORDER — ONDANSETRON HCL 4 MG/2ML IJ SOLN
4.0000 mg | Freq: Once | INTRAMUSCULAR | Status: AC
  Administered 2018-09-11: 4 mg via INTRAVENOUS

## 2018-09-11 NOTE — Progress Notes (Addendum)
128mcg fentanyl bolus and 50mg  of rocuronium given during central line placement. Verbal order received from Dr. Nelda Marseille at bedside for procedure and patient safety.

## 2018-09-11 NOTE — Procedures (Signed)
OGT Placement By MD  OGT placed under direct laryngoscopy given vomiting and confirmed by auscultation   Rush Farmer, M.D. [facility] Pulmonary/Critical Care Medicine. Pager: [phone]. After hours pager: [phone].

## 2018-09-11 NOTE — Significant Event (Addendum)
When I assessed the patient today, patient was on BiPAP but was minimally responsive.  I immediately called the patient's and niece on the phone and updated her about the clinical condition of the patient.  Patient's niece was very tearful about what was going on and wished me to proceed with lifesaving procedures if necessary.  I have explained to her that with her COPD, obstructive sleep apnea and possible obesity hypoventilation syndrome, severe pulmonary hypertension, decompensated heart failure and advanced CKD her prognosis was very poor.  I had this discussion with her yesterday also.  Patient's niece however wanted to proceed with invasive treatment if necessary.  Spoke with critical care Dr. Nelda Marseille at bedside.  Due to difficulty with protection of airway, patient will need to be intubated.  It is likely that the patient will have poor outcome due to advanced underlying problems including severe pulmonary hypertension.  We will transfer the patient to the ICU service at this time.

## 2018-09-11 NOTE — Progress Notes (Addendum)
Progress Note  Patient Name: [name] Date of Encounter: 09/11/2018  Primary Cardiologist: Buford Dresser, MD   Subjective   Pt with increased dyspnea needing to progress to BIPAP to keep 02 up, transferred to stepdown.  IV lasix given.   Today resting with BiPAP barely responds to verbal stimulus.     Inpatient Medications    Scheduled Meds: . amLODipine  10 mg Oral Daily  . budesonide (PULMICORT) nebulizer solution  0.25 mg Nebulization BID  . chlorhexidine  15 mL Mouth Rinse BID  . Chlorhexidine Gluconate Cloth  6 each Topical Daily  . docusate sodium  100 mg Oral BID  . feeding supplement (ENSURE ENLIVE)  237 mL Oral BID BM  . feeding supplement (PRO-STAT SUGAR FREE 64)  30 mL Oral BID  . folic acid  0.5 mg Oral Daily  . furosemide  80 mg Intravenous Q12H  . heparin  5,000 Units Subcutaneous Q8H  . hydrALAZINE  50 mg Oral Q8H  . insulin aspart  0-15 Units Subcutaneous TID WC  . insulin aspart  0-5 Units Subcutaneous QHS  . insulin detemir  10 Units Subcutaneous QHS  . ipratropium-albuterol  3 mL Nebulization TID  . iron polysaccharides  150 mg Oral BID  . mouth rinse  15 mL Mouth Rinse q12n4p  . montelukast  10 mg Oral QHS  . multivitamin  1 tablet Oral Daily  . nitroGLYCERIN  1 inch Topical Q8H  . omega-3 acid ethyl esters  1 g Oral Daily  . pantoprazole  40 mg Oral Q0600  . pyridOXINE  100 mg Oral Daily  . senna  1 tablet Oral BID  . simvastatin  20 mg Oral Daily  . traZODone  50 mg Oral QHS  . vitamin B-12  1,000 mcg Oral Daily  . vitamin E  400 Units Oral Daily   Continuous Infusions:  PRN Meds: acetaminophen **OR** acetaminophen, alum & mag hydroxide-simeth, cyclobenzaprine, hydrALAZINE, oxyCODONE-acetaminophen   Vital Signs    Vitals:   09/11/18 0357 09/11/18 0400 09/11/18 0500 09/11/18 0600  BP: (!) 175/61 (!) 129/52 (!) 170/66 (!) 132/53  Pulse: 70 67 (!) 54 66  Resp: (!) 35 (!) 31 (!) 30 (!) 31  Temp:      TempSrc:      SpO2: 99%  100% 98% 100%  Weight:   84.7 kg   Height:        Intake/Output Summary (Last 24 hours) at 09/11/2018 0805 Last data filed at 09/11/2018 0600 Gross per 24 hour  Intake 240 ml  Output 700 ml  Net -460 ml   Last 3 Weights 09/11/2018 09/10/2018 09/09/2018  Weight (lbs) 186 lb 11.7 oz 174 lb 12.8 oz 174 lb 14.4 oz  Weight (kg) 84.7 kg 79.289 kg 79.334 kg      Telemetry    SR to S. Arrhythmia  - Personally Reviewed  ECG    SR at 65 with T wave inversions V1-3 but no change from 09/03/18  - Personally Reviewed  Physical Exam   GEN: No acute distress.   Neck: + JVD Cardiac: RRR, no murmurs, rubs, or gallops.  Respiratory: diminshed to auscultation bilaterally. GI: Soft, nontender, non-distended  MS: No to tr  edema; No deformity. + ulcers on feet and feet wrapped. Neuro:  Nonfocal  Psych: Normal affect   Labs    High Sensitivity Troponin:   Recent Labs  Lab 09/03/18 0345 09/03/18 0600 09/10/18 2232 09/11/18 0030  TROPONINIHS 75* 72* 43* 40*  Chemistry Recent Labs  Lab 09/09/18 0511 09/10/18 0515 09/11/18 0230  NA 140 138 140  K 4.7 5.3* 5.5*  CL 99 97* 98  CO2 33* 32 32  GLUCOSE 161* 149* 217*  BUN 100* 108* 119*  CREATININE 2.30* 2.49* 2.57*  CALCIUM 9.0 8.9 9.2  GFRNONAA 20* 18* 17*  GFRAA 23* 21* 20*  ANIONGAP 8 9 10      Hematology Recent Labs  Lab 09/09/18 0511 09/10/18 0515 09/11/18 0230  WBC 6.7 7.4 7.6  RBC 2.99* 3.17* 3.19*  HGB 8.6* 9.2* 9.1*  HCT 29.1* 30.3* 31.6*  MCV 97.3 95.6 99.1  MCH 28.8 29.0 28.5  MCHC 29.6* 30.4 28.8*  RDW 17.6* 17.3* 17.3*  PLT 220 228 228    BNPNo results for input(s): BNP, PROBNP in the last 168 hours.   DDimer No results for input(s): DDIMER in the last 168 hours.   Radiology    Dg Chest Port 1 View  Result Date: 09/10/2018 CLINICAL DATA:  Hypoxia. EXAM: PORTABLE CHEST 1 VIEW COMPARISON:  09/07/2018 FINDINGS: Patient's chin obscures the apices. Unchanged cardiomegaly. Pulmonary edema appears  similar. Worsening retrocardiac and right lung base opacity. No pneumothorax. Degenerative change of the shoulders. IMPRESSION: 1. Worsening retrocardiac and right lung base opacity which may represent atelectasis and/or pleural fluid, particularly on the left. 2. Similar cardiomegaly and pulmonary edema. Electronically Signed   By: Keith Rake M.D.   On: 09/10/2018 23:03    Cardiac Studies   Echo June 19, 2018  1. The left ventricle has hyperdynamic systolic function, with an ejection fraction of >65%. The cavity size was normal. There is mildly increased left ventricular wall thickness. Left ventricular diastolic Doppler parameters are consistent with  pseudonormalization. Elevated left atrial and left ventricular end-diastolic pressures No evidence of left ventricular regional wall motion abnormalities. 2. The right ventricle has normal systolic function. The cavity was mildly enlarged. There is no increase in right ventricular wall thickness. Right ventricular systolic pressure is severely elevated with an estimated pressure of 79.2 mmHg. 3. Left atrial size was severely dilated. 4. Right atrial size was moderately dilated. 5. Tricuspid valve regurgitation is mild-moderate. 6. The aortic valve is tricuspid. Severely thickening of the aortic valve. Severe calcifcation of the aortic valve. Aortic valve regurgitation is mild by color flow Doppler. Moderate stenosis of the aortic valve. AV Mean Grad: 25.0 mmHg. AV Area (VTI):  1.54 cm. AV Vmax: 365.50 cm/s 7. The inferior vena cava was dilated in size with >50% respiratory variability.  Patient Profile     78 y.o. female with acute on chronic respiratory failure/hypoxia, longstanding severe COPD, chronic pulmonary artery hypertension, acute on chronic diastolic heart failure moderate aortic stenosis, mitral insufficiency with varying severity, CKD stage IV, DM  Assessment & Plan    Acute on chronic HFpEF:She will continue to have  JVD due to PAH/RHF even when she is adequately diuresed. Heart failure findings precede aortic stenosis.It is not clear to what degree her mitral insufficiency is contributing to her heart failure. --decompensated last evening on BiPAP.  Intubated for airway protection  --neg 4010 since admit wt down from 87.4 kg to 84.7 Kg  --on lasix 80 every 12 hours   AS:  Moderate. On echo, gradients exaggerated by markedly hyperdynamic LVEF  MR: moderate MR on last TTE  YBO:FBPZWC pulmonary hypertension also predates the progression of aortic stenosis. There is marked RV hypertrophy on echo. Unclear to what degree it is related to chronic lung disease and left heart  failure. ? CCM eval for pulmonary HTN  COPD:Prior to admission was already on home oxygen. Remains tachypneic.  DCV:UDTH likely contributing to the pulmonary artery hypertension. She is reported to not be compliant with CPAP. ?obesity hypoventilation sd.  CKD 4: uptick in Scr today (2.30 ->2.49-> 2.57), continue to monitor   DM:On insulin. Increases the likelihood that she has coronary disease. She is not a candidate for invasive or contrast based studies.  Elevated troponin:Flat pattern, not consistent with acute coronary syndrome. She denies angina. Although she has high likelihood of CAD, no reason for aggressive intervention at this time. --new troponins 43-40 yesterday and today decreased from prior at 75-72.    Anemia:Chronic. Normochromic, normal ferritin and B12 levels in the recent past seems to be at baseline. Note that for a patient with chronic hypoxemia, COPD and heart failure her degree of anemia is actually severe. This will make it harder to treat her heart failure. She might need EPO.  Today stable at 9.1  YHO:OILNZ pressure remains elevated. Very hyperdynamic LV, would benefit from beta blockers, but COPD precludes their use.On max dose amlodipine. Hydralazine increased to 50 mg TID.Choices  of antihypertensive agents severely limited by the presence of advanced kidney disease and reactive airway disease. --currently improved BP   Hyperkalemia today at 5.5  Overall long-term prognosis appears to be very poor due to the severity of underlying respiratory problems and pulmonary hypertension.         For questions or updates, please contact [place] Please consult [email or web address] for contact info under        Signed, Cecilie Kicks, NP  09/11/2018, 8:05 AM    Patient seen and examined.  Agree with above documentation.  She became minimally responsive this morning and was intubated for airway protection.  She is currently intubated and sedated.  Can attempt gentle diuresis, monitoring renal function, but suspect that her presentation is driven by COPD and likely obesity hypoventilation syndrome.

## 2018-09-11 NOTE — Progress Notes (Signed)
[facility]                                                                              [name], is a 78 y.o. female, DOB - November 01, 1940, KNL:[id number]  Admit date - 09/03/2018   Admitting Physician Mir Marry Guan, MD  Outpatient Primary MD for the patient is Garwin Brothers, MD  Outpatient specialists:   LOS - 8  days   Brief summary   Patient is a [name] with combined systolic and diastolic CHF (recent 2D echo with normal EF), type 2 diabetes mellitus, COPD with chronic respiratory failure on nocturnal home O2, anemia, obstructive sleep apnea, history of peptic ulcer disease, hypertension and hyperlipidemia presented to the hospital with increased breathing for past few days.  She was evaluated at [facility], ED today prior to admission for shortness of breath and discharged back.  She was noted to be hypoxic with O2 sat of 88% on 4 L nasal cannula.  EMS noted patient had wheezing and rales and treated with epinephrine and nitroglycerin. She was found tachypneic and hypoxic with acute on chronic combined systolic and diastolic CHF with chest x-ray showing pulmonary vascular congestion.  Also noted for acute on chronic kidney disease and hyperkalemia.  Patient was given Lasix 80 mg IV x1 and admitted for further work-up and treatment. COVID-19 test was negative.  Assessment & Plan  : Acute on chronic respiratory failure with hypoxia likely secondary to acute on chronic combined systolic and diastolic CHF (congestive heart failure). Patient was seen by cardiology and GI was receiving IV Lasix twice a day.  Despite that patient persisted to have decompensated congestive heart failure and decompensated requiring BiPAP on the time of my evaluation.  On torsemide as outpatient.  Moderate aortic stenosis, mitral regurgitation with severe pulmonary hypertension. Overall poor prognosis.   Acute kidney injury on CKD stage IV -Possibility of  cardiorenal syndrome with acute CHF. [facility] outpatient,-Creatinine stable, 2.3>2.2>2.3>2.4>2.5 on IV Lasix.  Hyperkalemia -Borderline high at 5.5 today.  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  Currently on BiPAP. Continue duo nebs, Singulair, Pulmicort Continue IV diuresis for now.  Not much compliant with CPAP at baseline..  Diabetes mellitus type 2, uncontrolled with CKD -CBGs stable, on Lantus, sliding scale insulin  Accelerated hypertension.  Unable to use beta blockers due to COPD.  On amlodipine.  Hydralazine dose has been increased by cardiology.  Unable to use ACE inhibitor's as well.   Elevated troponin -Likely due to demand ischemia with CHF, CKD.  No chest pain or abnormal EKG.  Pressure injury wounds, POA -Bilateral heel pressure injuries, stage III on right, Unstageable left foot, full-thickness tissue loss, stage II left hip ulcer.  We will continue wound care  Anemia of chronic kidney disease.  Hemoglobin 9.1.  No evidence of bleeding.  Code Status: Full code  DVT Prophylaxis: Heparin subcu  Family Communication: I spoke with the patient's friend and niece today about the deterioration in condition of the patient overnight.  Patient is currently  on BiPAP and is not much responsive.  Patient's niece stated that she would like to have the patient intubated if necessary.  Updated critical care at bedside.  Disposition Plan: skilled nursing facility.  Currently critically ill.  Spoke with Dr. Nelda Marseille critical care regarding potential need for intubation.  Procedures:  2D echo  Consultants:   Cardiology  Antimicrobials:   Anti-infectives (From admission, onward)   None      Subjective:   Patient had decompensated overnight requiring BiPAP and Lasix and was transferred to stepdown unit.  This morning patient was  minimally responsive on BiPAP.  Nursing staff also reported urinary retention.  Objective:   Vitals:   09/11/18 0800 09/11/18 0912 09/11/18 0914 09/11/18 0919  BP:   (!) 115/49   Pulse:   77   Resp:   (!) 33   Temp: 98.1 F (36.7 C)     TempSrc: Axillary     SpO2:  95% 94% 94%  Weight:      Height:        Intake/Output Summary (Last 24 hours) at 09/11/2018 1205 Last data filed at 09/11/2018 0600 Gross per 24 hour  Intake 120 ml  Output 700 ml  Net -580 ml     Wt Readings from Last 3 Encounters:  09/11/18 84.7 kg  08/02/18 81.6 kg  07/10/18 72 kg   Body mass index is 31.07 kg/m.   Physical Exam General: Overweight, minimally responsive on verbal command, on BiPAP, moderate respiratory distress.   HENT:   No scleral pallor or icterus noted. Oral mucosa is moist.  Distended neck veins. Chest: Diminished breath sounds bilaterally.  CVS: S1 &S2 heard.  Systolic murmur noted.  Regular rate and rhythm. Abdomen: Soft, nontender, nondistended.  Bowel sounds are heard.   Extremities: Bilateral heel pressure ulceration present on admission Psych: Lethargic on BiPAP. CNS: Lethargic on BiPAP  Medications  Scheduled Meds: . amLODipine  10 mg Oral Daily  . budesonide (PULMICORT) nebulizer solution  0.25 mg Nebulization BID  . chlorhexidine  15 mL Mouth Rinse BID  . Chlorhexidine Gluconate Cloth  6 each Topical Q0600  . docusate sodium  100 mg Oral BID  . feeding supplement (ENSURE ENLIVE)  237 mL Oral BID BM  . feeding supplement (PRO-STAT SUGAR FREE 64)  30 mL Oral BID  . folic acid  0.5 mg Oral Daily  . furosemide  80 mg Intravenous Q12H  . heparin  5,000 Units Subcutaneous Q8H  . hydrALAZINE  25 mg Oral Q8H  . insulin aspart  0-15 Units Subcutaneous TID WC  . insulin aspart  0-5 Units Subcutaneous QHS  . insulin detemir  10 Units Subcutaneous QHS  . ipratropium-albuterol  3 mL Nebulization TID  . iron polysaccharides  150 mg Oral BID  . mouth rinse  15 mL Mouth Rinse  [id number]  . montelukast  10 mg Oral QHS  . multivitamin  1 tablet Oral Daily  . mupirocin ointment  1 application Nasal BID  . omega-3 acid ethyl esters  1 g Oral Daily  . pantoprazole  40 mg Oral Q0600  . pyridOXINE  100 mg Oral Daily  . senna  1 tablet Oral BID  . simvastatin  20 mg Oral Daily  . traZODone  50 mg Oral QHS  . vitamin B-12  1,000 mcg Oral Daily  . vitamin E  400 Units Oral Daily   Continuous Infusions: PRN Meds:.acetaminophen **OR** acetaminophen, cyclobenzaprine, oxyCODONE-acetaminophen   Data Reviewed:  I have personally reviewed  following labs and imaging studies  Micro Results Recent Results (from the past 240 hour(s))  Blood culture (routine x 2)     Status: None   Collection Time: 09/01/18  3:59 PM   Specimen: BLOOD RIGHT HAND  Result Value Ref Range Status   Specimen Description BLOOD RIGHT HAND  Final   Special Requests   Final    BOTTLES DRAWN AEROBIC AND ANAEROBIC Blood Culture results may not be optimal due to an inadequate volume of blood received in culture bottles   Culture   Final    NO GROWTH 5 DAYS Performed at [facility], [place] [address] [place], [place] [address]    Report Status 09/06/2018 FINAL  Final  Blood culture (routine x 2)     Status: None   Collection Time: 09/01/18  4:06 PM   Specimen: BLOOD LEFT FOREARM  Result Value Ref Range Status   Specimen Description BLOOD LEFT FOREARM  Final   Special Requests   Final    BOTTLES DRAWN AEROBIC AND ANAEROBIC Blood Culture results may not be optimal due to an inadequate volume of blood received in culture bottles   Culture   Final    NO GROWTH 5 DAYS Performed at [facility], [place] [address] [place], [place] [address]    Report Status 09/06/2018 FINAL  Final  SARS CORONAVIRUS 2 Nasal Swab Aptima Multi Swab     Status: None   Collection Time: 09/03/18  6:33 AM   Specimen: Aptima Multi Swab; Nasal Swab  Result Value Ref Range Status   SARS Coronavirus 2 NEGATIVE NEGATIVE  Final    Comment: (NOTE) SARS-CoV-2 target nucleic acids are NOT DETECTED. The SARS-CoV-2 RNA is generally detectable in upper and lower respiratory specimens during the acute phase of infection. Negative results do not preclude SARS-CoV-2 infection, do not rule out co-infections with other pathogens, and should not be used as the sole basis for treatment or other patient management decisions. Negative results must be combined with clinical observations, patient history, and epidemiological information. The expected result is Negative. Fact Sheet for Patients: SugarRoll.be Fact Sheet for Healthcare Providers: https://www.woods-mathews.com/ This test is not yet approved or cleared by the [place] FDA and  has been authorized for detection and/or diagnosis of SARS-CoV-2 by FDA under an Emergency Use Authorization (EUA). This EUA will remain  in effect (meaning this test can be used) for the duration of the COVID-19 declaration under Section 56 4(b)(1) of the Act, 21 U.S.C. section 360bbb-3(b)(1), unless the authorization is terminated or revoked sooner. Performed at [facility], [place] [address] [place], [place] [address]   MRSA PCR Screening     Status: Abnormal   Collection Time: 09/03/18  8:37 PM   Specimen: Nasal Mucosa; Nasopharyngeal  Result Value Ref Range Status   MRSA by PCR POSITIVE (A) NEGATIVE Final    Comment:        The GeneXpert MRSA Assay (FDA approved for NASAL specimens only), is one component of a comprehensive MRSA colonization surveillance program. It is not intended to diagnose MRSA infection nor to guide or monitor treatment for MRSA infections. RESULT CALLED TO, READ BACK BY AND VERIFIED WITH: STEFFENS,M @ 0447 ON [id number] BY POTEAT,S Performed at [place] [address], [place], [place] [address]   SARS Coronavirus 2 [facility] order, Performed in [facility] hospital lab)  Nasopharyngeal Nasopharyngeal Swab     Status: None   Collection Time: 09/10/18 10:43 AM  Specimen: Nasopharyngeal Swab  Result Value Ref Range Status   SARS Coronavirus 2 NEGATIVE NEGATIVE Final    Comment: (NOTE) If result is NEGATIVE SARS-CoV-2 target nucleic acids are NOT DETECTED. The SARS-CoV-2 RNA is generally detectable in upper and lower  respiratory specimens during the acute phase of infection. The lowest  concentration of SARS-CoV-2 viral copies this assay can detect is 250  copies / mL. A negative result does not preclude SARS-CoV-2 infection  and should not be used as the sole basis for treatment or other  patient management decisions.  A negative result may occur with  improper specimen collection / handling, submission of specimen other  than nasopharyngeal swab, presence of viral mutation(s) within the  areas targeted by this assay, and inadequate number of viral copies  (<250 copies / mL). A negative result must be combined with clinical  observations, patient history, and epidemiological information. If result is POSITIVE SARS-CoV-2 target nucleic acids are DETECTED. The SARS-CoV-2 RNA is generally detectable in upper and lower  respiratory specimens dur ing the acute phase of infection.  Positive  results are indicative of active infection with SARS-CoV-2.  Clinical  correlation with patient history and other diagnostic information is  necessary to determine patient infection status.  Positive results do  not rule out bacterial infection or co-infection with other viruses. If result is PRESUMPTIVE POSTIVE SARS-CoV-2 nucleic acids MAY BE PRESENT.   A presumptive positive result was obtained on the submitted specimen  and confirmed on repeat testing.  While 2019 novel coronavirus  (SARS-CoV-2) nucleic acids may be present in the submitted sample  additional confirmatory testing may be necessary for epidemiological  and / or clinical management purposes  to  differentiate between  SARS-CoV-2 and other Sarbecovirus currently known to infect humans.  If clinically indicated additional testing with an alternate test  methodology [phone]) is advised. The SARS-CoV-2 RNA is generally  detectable in upper and lower respiratory sp ecimens during the acute  phase of infection. The expected result is Negative. Fact Sheet for Patients:  StrictlyIdeas.no Fact Sheet for Healthcare Providers: BankingDealers.co.za This test is not yet approved or cleared by the [place] FDA and has been authorized for detection and/or diagnosis of SARS-CoV-2 by FDA under an Emergency Use Authorization (EUA).  This EUA will remain in effect (meaning this test can be used) for the duration of the COVID-19 declaration under Section 564(b)(1) of the Act, 21 U.S.C. section 360bbb-3(b)(1), unless the authorization is terminated or revoked sooner. Performed at [facility] [place] [address] [place], [place] [address]     Radiology Reports Dg Chest 2 View  Result Date: 09/01/2018 CLINICAL DATA:  Chills and cough. EXAM: CHEST - 2 VIEW COMPARISON:  08/01/2018 FINDINGS: Low volumes. The cardio pericardial silhouette is enlarged. Pulmonary vascular congestion noted with interstitial pulmonary edema pattern. No substantial pleural effusion. The visualized bony structures of the thorax are intact. Degenerative changes noted left shoulder. IMPRESSION: Cardiomegaly with interstitial pulmonary edema. Electronically Signed   By: Misty Stanley M.D.   On: 09/01/2018 16:12   Dg Chest Port 1 View  Result Date: 09/10/2018 CLINICAL DATA:  Hypoxia. EXAM: PORTABLE CHEST 1 VIEW COMPARISON:  09/07/2018 FINDINGS: Patient's chin obscures the apices. Unchanged cardiomegaly. Pulmonary edema appears similar. Worsening retrocardiac and right lung base opacity. No pneumothorax. Degenerative change of the shoulders. IMPRESSION: 1.  Worsening retrocardiac and right lung base opacity which may represent atelectasis and/or pleural fluid, particularly on the left. 2. Similar cardiomegaly and pulmonary  edema. Electronically Signed   By: Keith Rake M.D.   On: 09/10/2018 23:03   Dg Chest Port 1 View  Result Date: 09/07/2018 CLINICAL DATA:  Dyspnea. EXAM: PORTABLE CHEST 1 VIEW COMPARISON:  Chest x-ray dated September 03, 2018. FINDINGS: Unchanged cardiomegaly. Diffuse interstitial and hazy airspace opacities have mildly worsened. No pleural effusion or pneumothorax. No acute osseous abnormality. IMPRESSION: 1. Worsening pulmonary edema. Electronically Signed   By: Titus Dubin M.D.   On: 09/07/2018 13:51   Dg Chest Port 1 View  Result Date: 09/03/2018 CLINICAL DATA:  Shortness of breath and wheezing EXAM: PORTABLE CHEST 1 VIEW COMPARISON:  Two days ago FINDINGS: Cardiomegaly with perihilar vascular congestion. Lung volumes are low. No effusion, Kerley lines, or pneumothorax. IMPRESSION: Cardiomegaly and vascular congestion with possible perihilar edema. Electronically Signed   By: Monte Fantasia M.D.   On: 09/03/2018 04:14   Vas [place] Upper Extremity Venous Duplex  Result Date: 09/04/2018 UPPER VENOUS STUDY  Indications: Edema Left forearm and arm Limitations: Body habitus and forearm edema. Comparison Study: No previous study available for comparison Performing Technologist: Toma Copier RVS  Examination Guidelines: A complete evaluation includes B-mode imaging, spectral Doppler, color Doppler, and power Doppler as needed of all accessible portions of each vessel. Bilateral testing is considered an integral part of a complete examination. Limited examinations for reoccurring indications may be performed as noted.  Right Findings: +----------+------------+---------+-----------+----------+-------+ RIGHT     CompressiblePhasicitySpontaneousPropertiesSummary +----------+------------+---------+-----------+----------+-------+  Subclavian    Full       Yes       Yes                      +----------+------------+---------+-----------+----------+-------+  Left Findings: +----------+------------+---------+-----------+----------+---------------------+ LEFT      CompressiblePhasicitySpontaneousProperties       Summary        +----------+------------+---------+-----------+----------+---------------------+ IJV           Full       Yes       Yes                                    +----------+------------+---------+-----------+----------+---------------------+ Subclavian    Full       Yes       Yes                                    +----------+------------+---------+-----------+----------+---------------------+ Axillary      Full       Yes       Yes                                    +----------+------------+---------+-----------+----------+---------------------+ Brachial      Full       Yes       Yes                                    +----------+------------+---------+-----------+----------+---------------------+ Radial        Full                                                        +----------+------------+---------+-----------+----------+---------------------+  Ulnar         Full                                                        +----------+------------+---------+-----------+----------+---------------------+ Cephalic    Partial                                 Acute in the [facility] into                                                        the mid upper arm   +----------+------------+---------+-----------+----------+---------------------+ Basilic                                              Unable to visualize                                                       the forearm due to                                                       edema. The upper arm                                                           is negative       +----------+------------+---------+-----------+----------+---------------------+  Summary:  Right: No evidence of thrombosis in the subclavian.  Left: No evidence of deep vein thrombosis in the upper extremity. No evidence of thrombosis in the subclavian. Findings consistent with acute superficial vein thrombosis involving the left cephalic vein.  *See table(s) above for measurements and observations.  Diagnosing physician: Deitra Mayo MD Electronically signed by Deitra Mayo MD on 09/04/2018 at 1:22:51 PM.    Final     Lab Data:  CBC: Recent Labs  Lab 09/07/18 9379 09/08/18 0419 09/09/18 0511 09/10/18 0515 09/11/18 0230  WBC 6.2 6.5 6.7 7.4 7.6  HGB 8.3* 8.1* 8.6* 9.2* 9.1*  HCT 28.4* 27.1* 29.1* 30.3* 31.6*  MCV 97.6 97.1 97.3 95.6 99.1  PLT 218 222 220 228 024   Basic Metabolic Panel: Recent Labs  Lab 09/07/18 0338 09/08/18 0419 09/09/18 0511 09/10/18 0515 09/11/18 0230  NA 141 140 140 138 140  K 4.6 5.0 4.7 5.3* 5.5*  CL 103 100 99 97* 98  CO2 29 29 33* 32 32  GLUCOSE 137* 126* 161* 149* 217*  BUN 96* 97* 100* 108* 119*  CREATININE 2.32* 2.23* 2.30* 2.49* 2.57*  CALCIUM  8.8* 8.7* 9.0 8.9 9.2   GFR: Estimated Creatinine Clearance: 20 mL/min (A) (by C-G formula based on SCr of 2.57 mg/dL (H)). Liver Function Tests: No results for input(s): AST, ALT, ALKPHOS, BILITOT, PROT, ALBUMIN in the last 168 hours. No results for input(s): LIPASE, AMYLASE in the last 168 hours. No results for input(s): AMMONIA in the last 168 hours. Coagulation Profile: No results for input(s): INR, PROTIME in the last 168 hours. Cardiac Enzymes: No results for input(s): CKTOTAL, CKMB, CKMBINDEX, TROPONINI in the last 168 hours. BNP (last 3 results) No results for input(s): PROBNP in the last 8760 hours. HbA1C: No results for input(s): HGBA1C in the last 72 hours. CBG: Recent Labs  Lab 09/10/18 0751 09/10/18 1159 09/10/18 1710 09/10/18 2153 09/11/18 0751  GLUCAP 139* 236*  176* 242* 128*   Lipid Profile: No results for input(s): CHOL, HDL, LDLCALC, TRIG, CHOLHDL, LDLDIRECT in the last 72 hours. Thyroid Function Tests: No results for input(s): TSH, T4TOTAL, FREET4, T3FREE, THYROIDAB in the last 72 hours. Anemia Panel: No results for input(s): VITAMINB12, FOLATE, FERRITIN, TIBC, IRON, RETICCTPCT in the last 72 hours. Urine analysis:    Component Value Date/Time   COLORURINE STRAW (A) 07/23/2018 1656   APPEARANCEUR CLEAR 07/23/2018 1656   LABSPEC 1.011 07/23/2018 1656   PHURINE 5.0 07/23/2018 1656   GLUCOSEU NEGATIVE 07/23/2018 1656   HGBUR NEGATIVE 07/23/2018 1656   BILIRUBINUR NEGATIVE 07/23/2018 1656   KETONESUR NEGATIVE 07/23/2018 1656   PROTEINUR NEGATIVE 07/23/2018 1656   UROBILINOGEN 0.2 12/16/2011 1717   NITRITE NEGATIVE 07/23/2018 1656   LEUKOCYTESUR NEGATIVE 07/23/2018 1656     Mauria Asquith M.D. [facility] 09/11/2018, 12:05 PM

## 2018-09-11 NOTE — Procedures (Signed)
Intubation Procedure Note [name] [facility] [id number] [birth year]  Procedure: Intubation Indications: Airway protection and maintenance  Procedure Details Consent: Risks of procedure as well as the alternatives and risks of each were explained to the (patient/caregiver).  Consent for procedure obtained. Time Out: Verified patient identification, verified procedure, site/side was marked, verified correct patient position, special equipment/implants available, medications/allergies/relevent history reviewed, required imaging and test results available.  Performed  Maximum sterile technique was used including antiseptics, gloves, gown and hand hygiene.  MAC    Evaluation Hemodynamic Status: BP stable throughout; O2 sats: stable throughout Patient's Current Condition: stable Complications: No apparent complications Patient did tolerate procedure well. Chest X-ray ordered to verify placement.  CXR: pending.   [name] 09/11/2018

## 2018-09-11 NOTE — Progress Notes (Signed)
Nutrition Follow-up  DOCUMENTATION CODES:   Obesity unspecified  INTERVENTION:  - if TF desired: recommend Vital High Protein @ 55 ml/hr with 30 ml prostat once/day. - this regimen will provide 1420 kcal, 130 grams protein, and 1103 ml free water.  - will monitor for POC and ongoing [place] discussions/decisions.    NUTRITION DIAGNOSIS:   Increased nutrient needs related to wound healing as evidenced by estimated needs. -ongoing  GOAL:   Patient will meet greater than or equal to 90% of their needs -unable to meet at this time.  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 She presented to the ED on 8/24 d/t SOB for several days. She was noted to be hypoxic with O2 sat of 88% on 4L nasal cannula. EMS noted patient had wheezing and rales and treated with epinephrine and nitroglycerin. She was found tachypneic and hypoxic with acute on CHF with CXR showed pulmonary vascular congestion. Also noted for acute on CKD and hyperkalemia. Given IV Lasix 80 mg x 1 and admitted to hospital service.  Re-estimated nutrition needs based on weight yesterday (79.3 kg) as flow sheet indicates that patient has mild edema to all extremities. Patient was previously on Heart Healthy/Carb Modified diet and was eating 50-100% of all meals from 8/26-8/31. Ensure Enlive ordered BID and she was accepting this 50% of the time. Prostat ordered BID and she was accepting this 90% of the time.   Briefly interacted with RN at bedside. Patient was previously on BiPAP but was minimally responsive and was intubated late this AM. OGT is currently in place. She is now a limited code.     Patient is currently intubated on ventilator support MV: 8.1 L/min Temp  (24hrs), Avg:98.3 F (36.8 C), Min:98 F (36.7 C), Max:98.8 F (37.1 C) Propofol: none BP: 154/52  and MAP: 91    Labs reviewed; CBGs: 128 and 88 mg/dl, K: 5.5 mmol/l, BUN: 119 mg/dl, creatinine: 2.57 mg/dl, GFR: 20 ml/min. Medications reviewed; 100 mg colace BID, 0.5 mg folvite/day, 80 mg IV lasix BID, sliding scale novolog, 10 units levemir/day, 1 tablet prosight/day, 100 mg vitamin B6/day, 1 tablet senokot BID, 1000 mcg cyanocobalamin/day, 400 units vitamin E/day.  Drips; levo @ 2 mcg/min, precedex @ 0.8 mcg/kg/hr, fentanyl @ 200 mcg/hr.    Diet Order:   Diet Order            Diet heart healthy/carb modified Room service appropriate? Yes; Fluid consistency: Thin  Diet effective now              EDUCATION NEEDS:   No education needs have been identified at this time  Skin:  Skin Assessment: Skin Integrity Issues: Skin Integrity Issues:: Stage II, Stage III, Unstageable Stage II: L hip and mid sacrum Stage III: R heel Unstageable: L foot and L heel  Last BM:  8/31  Height:   Ht Readings from Last 1 Encounters:  09/05/18 5\' 5"  (1.651 m)    Weight:   Wt Readings from Last 1 Encounters:  09/11/18 84.7 kg    Ideal Body Weight:  56.8 kg  BMI:  Body mass index is 31.07 kg/m.  Estimated Nutritional Needs:   Kcal:  1472 kcal  Protein:  125-135 grams  Fluid:  >/= 2.2 L/day     Jarome Matin, MS, RD, LDN, [facility] Inpatient Clinical Dietitian Pager # [phone] After hours/weekend pager # [phone]

## 2018-09-11 NOTE — Progress Notes (Addendum)
Xcover Hypoxia per RN 77% on 5LPM?  Exam:   Heent: anicteric Neck + jvd Heart: rrr s1, s2, 2/6 sem rusb Lung: crackles right lung base, decrease in bs left lung base Abd: soft,  Ext:  No c/c,  1+ edema  CXR IMPRESSION: 1. Worsening retrocardiac and right lung base opacity which may represent atelectasis and/or pleural fluid, particularly on the left. 2. Similar cardiomegaly and pulmonary edema.  A/P Acute respiratory failure with hypoxia, and hypercapnea Secondary to acute CHF Check CXR , see above Bipap,  Lasix 40mg  iv x1 Transfer to stepdown  Critical care time 30 minutes

## 2018-09-11 NOTE — Progress Notes (Signed)
Echocardiogram 2D Echocardiogram has been performed.  Oneal Deputy Elmer Merwin 09/11/2018, 3:05 PM

## 2018-09-11 NOTE — Progress Notes (Addendum)
eLink Physician-Brief Progress Note Patient Name: [name] DOB: 26-Oct-1940 MRN: [id number]   Date of Service  09/11/2018  HPI/Events of Note  Notified of hypoglycemia at 19.  Lantus held.  NGT to low continuous suction.  Bladder scan also with 530cc.  eICU Interventions  Insert foley cath.  Start on Q3AF@ 22ml/hr.  Check POC glucose q4hours while NPO.     Intervention Category Intermediate Interventions: Other:  Elsie Lincoln 09/11/2018, 10:33 PM

## 2018-09-11 NOTE — Consult Note (Signed)
NAME:  [name], MRN:  [id number], DOB:  [birth year], LOS: 8 ADMISSION DATE:  09/03/2018, CONSULTATION DATE:  09/11/2018 REFERRING MD:  TRH-Pokhrel, CHIEF COMPLAINT:  Acute respiratory failure   Brief History   78 year old female with PMH of COPD and pulmonary HTN who presents with COPD exacerbation and respiratory failure.  Patient is altered and unable to provide history.  History from records.  Severe COPD and pulmonary HTN.  Presenting with acute on chronic hypoxemic respiratory failure.  History of present illness   78 year old female with PMH of COPD and pulmonary HTN who presents with COPD exacerbation and respiratory failure.  Patient is altered and unable to provide history.  History from records.  Severe COPD and pulmonary HTN.  Presenting with acute on chronic hypoxemic respiratory failure.  Past Medical History  CHF COPD Pulmonary HTN  Significant Hospital Events   Intubated 9/1  Consults:  PCCM  Procedures:  ETT 9/1>>>  Significant Diagnostic Tests:  CXR 9/1>>>I reviewed myself, pulmonary edema noted  Micro Data:  MRSA positive COVID negative  Antimicrobials:  N/A   Interim history/subjective:  Acute respiratory failure and AMS  Objective   Blood pressure (!) 115/49, pulse 77, temperature 98.1 F (36.7 C), temperature source Axillary, resp. rate (!) 33, height 5\' 5"  (1.651 m), weight 84.7 kg, SpO2 94 %.    FiO2 (%):  [40 %-60 %] 40 %   Intake/Output Summary (Last 24 hours) at 09/11/2018 1119 Last data filed at 09/11/2018 0600 Gross per 24 hour  Intake 120 ml  Output 700 ml  Net -580 ml   Filed Weights   09/09/18 0456 09/10/18 0522 09/11/18 0500  Weight: 79.3 kg 79.3 kg 84.7 kg    Examination: General: Acute on chronically ill appearing female, respiratory distress noted HENT: [place]/AT, PERRL, EOM-I and MMM, ETT in place now Lungs: Diminished diffusely Cardiovascular: RRR, Nl S1/S2 and -M/R/G Abdomen: Soft, NT, ND and +BS Extremities: -edema and  -tenderness Neuro: Moving all ext to command and arousable but not coughing effectively Skin: Intact  I reviewed CXR myself, pulmonary edema noted  Resolved Hospital Problem list   N/A  Assessment & Plan:  78 year old female with extensive PMH who presents to PCCM with respiratory failure that is likely multi-factorial with AMS and inability to protect her airway.  Discussed with PCCM-NP.    Acute respiratory failure:  - Intubate  - Full vent support  - Gentle diureses  - ABG  - Adjust vent for ABG  - CXR now and in AM  Pulmonary HTN:  - Continue norvasc  COPD:  - Pulmicort  - Albuterol  - Duoneb  - Hold solumedrol given no wheezing  AMS: no localizing symptoms  - Precedex  - Fentanyl  - Hold off neuro imaging for now  FEN:  - TF per nutrition  GOC:  - Spoke with niece (only living relative) extensively, aware that chances to come of the vent is negligible but wish for full code status.   Labs   CBC: Recent Labs  Lab 09/07/18 0338 09/08/18 0419 09/09/18 0511 09/10/18 0515 09/11/18 0230  WBC 6.2 6.5 6.7 7.4 7.6  HGB 8.3* 8.1* 8.6* 9.2* 9.1*  HCT 28.4* 27.1* 29.1* 30.3* 31.6*  MCV 97.6 97.1 97.3 95.6 99.1  PLT 218 222 220 228 962    Basic Metabolic Panel: Recent Labs  Lab 09/07/18 0338 09/08/18 0419 09/09/18 0511 09/10/18 0515 09/11/18 0230  NA 141 140 140 138 140  K  4.6 5.0 4.7 5.3* 5.5*  CL 103 100 99 97* 98  CO2 29 29 33* 32 32  GLUCOSE 137* 126* 161* 149* 217*  BUN 96* 97* 100* 108* 119*  CREATININE 2.32* 2.23* 2.30* 2.49* 2.57*  CALCIUM 8.8* 8.7* 9.0 8.9 9.2   GFR: Estimated Creatinine Clearance: 20 mL/min (A) (by C-G formula based on SCr of 2.57 mg/dL (H)). Recent Labs  Lab 09/08/18 0419 09/09/18 0511 09/10/18 0515 09/11/18 0230  WBC 6.5 6.7 7.4 7.6    Liver Function Tests: No results for input(s): AST, ALT, ALKPHOS, BILITOT, PROT, ALBUMIN in the last 168 hours. No results for input(s): LIPASE, AMYLASE in the last 168 hours.  No results for input(s): AMMONIA in the last 168 hours.  ABG    Component Value Date/Time   PHART 7.271 (L) 09/10/2018 2240   PCO2ART 77.4 (HH) 09/10/2018 2240   PO2ART 48.2 (L) 09/10/2018 2240   HCO3 34.4 (H) 09/10/2018 2240   TCO2 22 09/03/2014 0002   ACIDBASEDEF 1.3 07/27/2018 0421   O2SAT 81.3 09/10/2018 2240     Coagulation Profile: No results for input(s): INR, PROTIME in the last 168 hours.  Cardiac Enzymes: No results for input(s): CKTOTAL, CKMB, CKMBINDEX, TROPONINI in the last 168 hours.  HbA1C: Hgb A1c MFr Bld  Date/Time Value Ref Range Status  06/16/2018 08:15 PM 9.9 (H) 4.8 - 5.6 % Final    Comment:    (NOTE)         Prediabetes: 5.7 - 6.4         Diabetes: >6.4         Glycemic control for adults with diabetes: <7.0   04/04/2017 02:04 AM 5.8 (H) 4.8 - 5.6 % Final    Comment:    (NOTE)         Prediabetes: 5.7 - 6.4         Diabetes: >6.4         Glycemic control for adults with diabetes: <7.0     CBG: Recent Labs  Lab 09/10/18 0751 09/10/18 1159 09/10/18 1710 09/10/18 2153 09/11/18 0751  GLUCAP 139* 236* 176* 242* 128*    Review of Systems:   Unattainable given AMS  Past Medical History  She,  has a past medical history of Adenomatous colon polyp, Anemia, Arthritis, Asthma, COPD (chronic obstructive pulmonary disease) ([place]), COPD with asthma ([place]) (04/20/2011), Depression, Diabetes mellitus, Diverticulosis, Gastric ulcer, GERD (gastroesophageal reflux disease), HTN (hypertension), Hypercholesterolemia, Internal hemorrhoids, Lumbar disc disease, On home oxygen therapy, OSA (obstructive sleep apnea) (09/17/2012), Peptic ulcer disease, 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   Surgical History    Past Surgical History:  Procedure Laterality Date  . COLONOSCOPY  2209 or 2010    2001, 2005, 2009  colonoscopy.  [place]  . COLONOSCOPY N/A 08/14/2012   Procedure: COLONOSCOPY;  Surgeon: Ladene Artist, MD;  Location[facility] ENDOSCOPY;   Service: Endoscopy;  Laterality: N/A;  . ESOPHAGOGASTRODUODENOSCOPY (EGD) WITH PROPOFOL N/A 07/25/2018   Procedure: ESOPHAGOGASTRODUODENOSCOPY (EGD) WITH PROPOFOL;  Surgeon: Gatha Mayer, MD;  Location: [facility];  Service: Endoscopy;  Laterality: N/A;  . INCISE AND DRAIN ABCESS     Abdominal Wall  . LIPOMA RESECTION     Right Neck  . TOTAL KNEE ARTHROPLASTY     Bilateral  . UMBILICAL HERNIA REPAIR  2007   incarcerated  . UPPER GASTROINTESTINAL ENDOSCOPY  2001   medoff Grade A esophagitis     Social History   reports that she quit smoking  about 60 years ago. Her smoking use included cigarettes. She has a 10.00 pack-year smoking history. She has never used smokeless tobacco. She reports that she does not drink alcohol or use drugs.   Family History   Her family history includes Breast cancer in her maternal aunt.   Allergies Allergies  Allergen Reactions  . Other Other (See Comments)    "all generics make her sick"     Home Medications  Prior to Admission medications   Medication Sig Start Date End Date Taking? Authorizing Provider  acetaminophen (TYLENOL) 325 MG tablet Take 650 mg by mouth every 6 (six) hours as needed for mild pain.   Yes [provider]  Amino Acids-Protein Hydrolys (FEEDING SUPPLEMENT, PRO-STAT SUGAR FREE 64,) LIQD Take 30 mLs by mouth 2 (two) times daily. 06/20/18  Yes [place] Eric J, DO  amLODipine (NORVASC) 10 MG tablet Take 10 mg by mouth daily.    Yes [provider]  calcitRIOL (ROCALTROL) 0.25 MCG capsule Take 1 capsule (0.25 mcg total) by mouth every Monday, Wednesday, and Friday. Patient taking differently: Take 0.25 mcg by mouth daily.  12/05/16  Yes Velvet Bathe, MD  cyclobenzaprine (FLEXERIL) 10 MG tablet Take 10 mg 3 (three) times daily as needed by mouth for muscle spasms.   Yes [provider]  feeding supplement, GLUCERNA SHAKE, (GLUCERNA SHAKE) LIQD Take 237 mLs by mouth 2 (two) times daily between meals.  08/02/18  Yes Mikhail, Velta Addison, DO  folic acid (FOLVITE) 053 MCG tablet Take 400 mcg by mouth daily.   Yes [provider]  Insulin Aspart (NOVOLOG FLEXPEN [place]) Inject 0-10 Units into the skin See admin instructions. Per Sliding Scale ar 0600, 1100, 1600, 2100 100-150 0units 151-200 2units 201-250 4units 251-300 6units 301-350 8units 351-400 10units   Yes [provider]  insulin aspart (NOVOLOG) 100 UNIT/ML injection Inject 6 Units into the skin 3 (three) times daily with meals. 06/20/18  Yes [place] Eric J, DO  insulin glargine (LANTUS) 100 UNIT/ML injection Inject 0.15 mLs (15 Units total) into the skin at bedtime. 08/02/18  Yes Mikhail, Maryann, DO  ipratropium-albuterol (DUONEB) 0.5-2.5 (3) MG/3ML SOLN Take 3 mLs by nebulization every 6 (six) hours as needed (SOB, wheezing).   Yes [provider]  iron polysaccharides (NIFEREX) 150 MG capsule Take 150 mg by mouth 2 (two) times daily.   Yes [provider]  magnesium oxide (MAG-OX) 400 MG tablet Take 400 mg by mouth daily.   Yes [provider]  montelukast (SINGULAIR) 10 MG tablet Take 1 tablet (10 mg total) by mouth at bedtime. 10/05/16  Yes Chesley Mires, MD  Multiple Vitamins-Minerals (MULTIVITAMIN ADULT PO) Take 1 tablet by mouth daily.   Yes [provider]  nitroGLYCERIN (NITROSTAT) 0.4 MG SL tablet Place 0.4 mg under the tongue every 5 (five) minutes as needed for chest pain.   Yes [provider]  Omega-3 Fatty Acids (FISH OIL) 1000 MG CAPS Take 1,000 mg by mouth daily.    Yes [provider]  oxyCODONE-acetaminophen (PERCOCET/ROXICET) 5-325 MG tablet Take 1 tablet by mouth every 6 (six) hours as needed for severe pain. 08/02/18  Yes Mikhail, Velta Addison, DO  pantoprazole (PROTONIX) 40 MG tablet Take 1 tablet (40 mg total) by mouth daily at 6 (six) AM. 08/03/18  Yes Mikhail, Velta Addison, DO  Propylene Glycol (SYSTANE BALANCE) 0.6 % SOLN Apply 1 drop to eye 2 (two) times daily.     Yes [provider]  pyridOXINE (VITAMIN B-6) 100 MG  tablet Take 100 mg by mouth daily.   Yes [provider]  torsemide (DEMADEX) 10 MG tablet Take 2 tablets (20 mg total) by mouth 2 (two) times daily. 07/09/18  Yes Domenic Polite, MD  traZODone (DESYREL) 50 MG tablet Take 50 mg by mouth at bedtime.    Yes [provider]  vitamin B-12 (CYANOCOBALAMIN) 1000 MCG tablet Take 1,000 mcg by mouth daily.   Yes [provider]  Vitamin D, Ergocalciferol, (DRISDOL) 50000 UNITS CAPS capsule Take 50,000 Units by mouth 2 (two) times a week. MON and THUR 04/07/14  Yes [provider]  vitamin E 400 UNIT capsule Take 400 Units by mouth daily.   Yes [provider]  ZOCOR 20 MG tablet Take 20 mg by mouth daily. 03/16/14  Yes [provider]  multivitamin (PROSIGHT) TABS tablet Take 1 tablet by mouth daily. Patient not taking: Reported on 09/03/2018 08/02/18   Cristal Ford, DO    The patient is critically ill with multiple organ systems failure and requires high complexity decision making for assessment and support, frequent evaluation and titration of therapies, application of advanced monitoring technologies and extensive interpretation of multiple databases.   Critical Care Time devoted to patient care services described in this note is  90  Minutes. This time reflects time of care of this signee Dr [name]. This critical care time does not reflect procedure time, or teaching time or supervisory time of PA/NP/Med student/Med Resident etc but could involve care discussion time.  Rush Farmer, M.D. [facility] Pulmonary/Critical Care Medicine. Pager: [phone]. After hours pager: [phone].

## 2018-09-11 NOTE — Progress Notes (Signed)
Pt. placed on BiPAP V-60 due to needing more support.

## 2018-09-11 NOTE — Procedures (Signed)
Central Venous Catheter Insertion Procedure Note [name] [name] [id number] 03/01/1940  Procedure: Insertion of Central Venous Catheter Indications: Drug and/or fluid administration  Procedure Details Consent: Risks of procedure as well as the alternatives and risks of each were explained to the (patient/caregiver).  Consent for procedure obtained. Time Out: Verified patient identification, verified procedure, site/side was marked, verified correct patient position, special equipment/implants available, medications/allergies/relevent history reviewed, required imaging and test results available.  Performed  Maximum sterile technique was used including antiseptics, cap, gloves, gown, hand hygiene, mask and sheet. Skin prep: Chlorhexidine; local anesthetic administered A antimicrobial bonded/coated triple lumen catheter was placed in the left subclavian vein using the Seldinger technique.  Evaluation Blood flow good Complications: No apparent complications Patient did tolerate procedure well. Chest X-ray ordered to verify placement.  CXR: pending.  [name] 09/11/2018, 1:13 PM  Central Venous Catheter Insertion Procedure Note [name] [facility] [id number] Dec 02, 1940  Procedure: Insertion of Central Venous Catheter Indications: Drug and/or fluid administration  Procedure Details Consent: Risks of procedure as well as the alternatives and risks of each were explained to the (patient/caregiver).  Consent for procedure obtained. Time Out: Verified patient identification, verified procedure, site/side was marked, verified correct patient position, special equipment/implants available, medications/allergies/relevent history reviewed, required imaging and test results available.  Performed  Maximum sterile technique was used including antiseptics, cap, gloves, gown, hand hygiene, mask and sheet. Skin prep: Chlorhexidine; local anesthetic administered A antimicrobial bonded/coated triple lumen  catheter was placed in the left subclavian vein using the Seldinger technique.  Evaluation Blood flow good Complications: No apparent complications Patient did tolerate procedure well. Chest X-ray ordered to verify placement.  CXR: pending.  [name] 09/11/2018, 1:13 PM

## 2018-09-12 ENCOUNTER — Inpatient Hospital Stay (HOSPITAL_COMMUNITY): Payer: Medicare Other

## 2018-09-12 DIAGNOSIS — I5043 Acute on chronic combined systolic (congestive) and diastolic (congestive) heart failure: Secondary | ICD-10-CM

## 2018-09-12 DIAGNOSIS — J9601 Acute respiratory failure with hypoxia: Secondary | ICD-10-CM

## 2018-09-12 DIAGNOSIS — J9602 Acute respiratory failure with hypercapnia: Secondary | ICD-10-CM

## 2018-09-12 DIAGNOSIS — G9340 Encephalopathy, unspecified: Secondary | ICD-10-CM

## 2018-09-12 LAB — BLOOD GAS, ARTERIAL
Acid-Base Excess: 10 mmol/L — ABNORMAL HIGH (ref 0.0–2.0)
Bicarbonate: 33.6 mmol/L — ABNORMAL HIGH (ref 20.0–28.0)
Drawn by: 225631
FIO2: 40
MECHVT: 0.45 mL
O2 Saturation: 93.8 %
PEEP: 5 cmH2O
Patient temperature: 99.7
RATE: 20 resp/min
pCO2 arterial: 42.7 mmHg (ref 32.0–48.0)
pH, Arterial: 7.511 — ABNORMAL HIGH (ref 7.350–7.450)
pO2, Arterial: 67.5 mmHg — ABNORMAL LOW (ref 83.0–108.0)

## 2018-09-12 LAB — GLUCOSE, CAPILLARY
Glucose-Capillary: 107 mg/dL — ABNORMAL HIGH (ref 70–99)
Glucose-Capillary: 113 mg/dL — ABNORMAL HIGH (ref 70–99)
Glucose-Capillary: 119 mg/dL — ABNORMAL HIGH (ref 70–99)
Glucose-Capillary: 165 mg/dL — ABNORMAL HIGH (ref 70–99)
Glucose-Capillary: 174 mg/dL — ABNORMAL HIGH (ref 70–99)
Glucose-Capillary: 184 mg/dL — ABNORMAL HIGH (ref 70–99)
Glucose-Capillary: 224 mg/dL — ABNORMAL HIGH (ref 70–99)

## 2018-09-12 LAB — BASIC METABOLIC PANEL
Anion gap: 10 (ref 5–15)
BUN: 122 mg/dL — ABNORMAL HIGH (ref 8–23)
CO2: 31 mmol/L (ref 22–32)
Calcium: 8.7 mg/dL — ABNORMAL LOW (ref 8.9–10.3)
Chloride: 101 mmol/L (ref 98–111)
Creatinine, Ser: 2.94 mg/dL — ABNORMAL HIGH (ref 0.44–1.00)
GFR calc Af Amer: 17 mL/min — ABNORMAL LOW (ref >60)
GFR calc non Af Amer: 15 mL/min — ABNORMAL LOW (ref >60)
Glucose, Bld: 100 mg/dL — ABNORMAL HIGH (ref 70–99)
Potassium: 5.2 mmol/L — ABNORMAL HIGH (ref 3.5–5.1)
Sodium: 142 mmol/L (ref 135–145)

## 2018-09-12 LAB — CBC
HCT: 27.2 % — ABNORMAL LOW (ref 36.0–46.0)
Hemoglobin: 8.2 g/dL — ABNORMAL LOW (ref 12.0–15.0)
MCH: 28.6 pg (ref 26.0–34.0)
MCHC: 30.1 g/dL (ref 30.0–36.0)
MCV: 94.8 fL (ref 80.0–100.0)
Platelets: 240 10*3/uL (ref 150–400)
RBC: 2.87 MIL/uL — ABNORMAL LOW (ref 3.87–5.11)
RDW: 18.5 % — ABNORMAL HIGH (ref 11.5–15.5)
WBC: 7.5 10*3/uL (ref 4.0–10.5)
nRBC: 0 % (ref 0.0–0.2)

## 2018-09-12 LAB — PHOSPHORUS
Phosphorus: 3.2 mg/dL (ref 2.5–4.6)
Phosphorus: 3.5 mg/dL (ref 2.5–4.6)
Phosphorus: 4.2 mg/dL (ref 2.5–4.6)

## 2018-09-12 LAB — MAGNESIUM
Magnesium: 2.5 mg/dL — ABNORMAL HIGH (ref 1.7–2.4)
Magnesium: 2.3 mg/dL (ref 1.7–2.4)
Magnesium: 2.3 mg/dL (ref 1.7–2.4)

## 2018-09-12 MED ORDER — BUDESONIDE 0.5 MG/2ML IN SUSP
0.5000 mg | Freq: Two times a day (BID) | RESPIRATORY_TRACT | Status: DC
  Administered 2018-09-12 – 2018-09-21 (×18): 0.5 mg via RESPIRATORY_TRACT
  Filled 2018-09-12 (×18): qty 2

## 2018-09-12 MED ORDER — INSULIN ASPART 100 UNIT/ML ~~LOC~~ SOLN
0.0000 [IU] | SUBCUTANEOUS | Status: DC
  Administered 2018-09-12 – 2018-09-13 (×4): 3 [IU] via SUBCUTANEOUS
  Administered 2018-09-13: 5 [IU] via SUBCUTANEOUS
  Administered 2018-09-13: 2 [IU] via SUBCUTANEOUS
  Administered 2018-09-13 – 2018-09-14 (×3): 3 [IU] via SUBCUTANEOUS
  Administered 2018-09-14 (×2): 5 [IU] via SUBCUTANEOUS
  Administered 2018-09-14 – 2018-09-15 (×4): 3 [IU] via SUBCUTANEOUS
  Administered 2018-09-15: 8 [IU] via SUBCUTANEOUS
  Administered 2018-09-16: 3 [IU] via SUBCUTANEOUS
  Administered 2018-09-16: 05:00:00 2 [IU] via SUBCUTANEOUS
  Administered 2018-09-16: 5 [IU] via SUBCUTANEOUS
  Administered 2018-09-16: 14:00:00 8 [IU] via SUBCUTANEOUS
  Administered 2018-09-16: 21:00:00 11 [IU] via SUBCUTANEOUS
  Administered 2018-09-16 (×2): 8 [IU] via SUBCUTANEOUS
  Administered 2018-09-17: 08:00:00 2 [IU] via SUBCUTANEOUS
  Administered 2018-09-17: 12:00:00 5 [IU] via SUBCUTANEOUS
  Administered 2018-09-17: 8 [IU] via SUBCUTANEOUS
  Administered 2018-09-17: 05:00:00 2 [IU] via SUBCUTANEOUS
  Administered 2018-09-17 – 2018-09-18 (×2): 5 [IU] via SUBCUTANEOUS
  Administered 2018-09-18: 8 [IU] via SUBCUTANEOUS
  Administered 2018-09-18: 5 [IU] via SUBCUTANEOUS
  Administered 2018-09-18: 09:00:00 2 [IU] via SUBCUTANEOUS
  Administered 2018-09-18: 5 [IU] via SUBCUTANEOUS
  Administered 2018-09-18: 2 [IU] via SUBCUTANEOUS
  Administered 2018-09-19: 3 [IU] via SUBCUTANEOUS
  Administered 2018-09-19 (×3): 5 [IU] via SUBCUTANEOUS
  Administered 2018-09-19: 08:00:00 2 [IU] via SUBCUTANEOUS
  Administered 2018-09-20: 8 [IU] via SUBCUTANEOUS
  Administered 2018-09-20: 5 [IU] via SUBCUTANEOUS
  Administered 2018-09-20: 2 [IU] via SUBCUTANEOUS
  Administered 2018-09-21 (×2): 3 [IU] via SUBCUTANEOUS
  Administered 2018-09-21: 8 [IU] via SUBCUTANEOUS

## 2018-09-12 MED ORDER — FUROSEMIDE 10 MG/ML IJ SOLN: 40.0000 mg | Freq: Two times a day (BID) | INTRAMUSCULAR | Status: DC

## 2018-09-12 MED ORDER — HYDRALAZINE HCL 50 MG PO TABS
50.0000 mg | ORAL_TABLET | Freq: Three times a day (TID) | ORAL | Status: DC
  Administered 2018-09-12 – 2018-09-15 (×7): 50 mg
  Filled 2018-09-12 (×7): qty 1

## 2018-09-12 MED ORDER — VITAMIN E 180 MG (400 UNIT) PO CAPS
400.0000 [IU] | ORAL_CAPSULE | Freq: Every day | ORAL | Status: DC
  Administered 2018-09-13 – 2018-09-14 (×2): 400 [IU]
  Filled 2018-09-12 (×3): qty 1

## 2018-09-12 MED ORDER — PRO-STAT SUGAR FREE PO LIQD
30.0000 mL | Freq: Every day | ORAL | Status: DC
  Administered 2018-09-13 – 2018-09-14 (×2): 30 mL
  Filled 2018-09-12 (×2): qty 30

## 2018-09-12 MED ORDER — VITAL HIGH PROTEIN PO LIQD: 1000.0000 mL | ORAL | Status: DC

## 2018-09-12 MED ORDER — PRO-STAT SUGAR FREE PO LIQD
30.0000 mL | Freq: Two times a day (BID) | ORAL | Status: DC
  Administered 2018-09-12: 12:00:00 30 mL
  Filled 2018-09-12: qty 30

## 2018-09-12 MED ORDER — VITAMIN B-6 100 MG PO TABS
100.0000 mg | ORAL_TABLET | Freq: Every day | ORAL | Status: DC
  Administered 2018-09-13 – 2018-09-14 (×2): 100 mg
  Filled 2018-09-12 (×3): qty 1

## 2018-09-12 MED ORDER — VITAMIN B-12 1000 MCG PO TABS
1000.0000 ug | ORAL_TABLET | Freq: Every day | ORAL | Status: DC
  Administered 2018-09-13 – 2018-09-14 (×2): 1000 ug
  Filled 2018-09-12 (×2): qty 1

## 2018-09-12 MED ORDER — MONTELUKAST SODIUM 10 MG PO TABS
10.0000 mg | ORAL_TABLET | Freq: Every day | ORAL | Status: DC
  Administered 2018-09-12 – 2018-09-13 (×2): 10 mg
  Filled 2018-09-12 (×2): qty 1

## 2018-09-12 MED ORDER — SODIUM ZIRCONIUM CYCLOSILICATE 10 G PO PACK
10.0000 g | PACK | Freq: Once | ORAL | Status: AC
  Administered 2018-09-12: 14:00:00 10 g via ORAL
  Filled 2018-09-12: qty 1

## 2018-09-12 MED ORDER — SENNA 8.6 MG PO TABS
1.0000 | ORAL_TABLET | Freq: Two times a day (BID) | ORAL | Status: DC
  Administered 2018-09-12 – 2018-09-14 (×4): 8.6 mg
  Filled 2018-09-12 (×5): qty 1

## 2018-09-12 MED ORDER — PANTOPRAZOLE SODIUM 40 MG PO PACK
40.0000 mg | PACK | Freq: Every day | ORAL | Status: DC
  Administered 2018-09-12 – 2018-09-13 (×2): 40 mg
  Filled 2018-09-12 (×2): qty 20

## 2018-09-12 MED ORDER — TRAZODONE HCL 50 MG PO TABS
50.0000 mg | ORAL_TABLET | Freq: Every day | ORAL | Status: DC
  Administered 2018-09-12 – 2018-09-13 (×2): 50 mg
  Filled 2018-09-12 (×2): qty 1

## 2018-09-12 MED ORDER — PANTOPRAZOLE SODIUM 40 MG IV SOLR
40.0000 mg | Freq: Every day | INTRAVENOUS | Status: DC
  Administered 2018-09-12: 40 mg via INTRAVENOUS
  Filled 2018-09-12: qty 40

## 2018-09-12 MED ORDER — OXYCODONE-ACETAMINOPHEN 5-325 MG PO TABS
1.0000 | ORAL_TABLET | Freq: Four times a day (QID) | ORAL | Status: DC | PRN
  Administered 2018-09-17: 04:00:00 1
  Filled 2018-09-12: qty 1

## 2018-09-12 MED ORDER — SIMVASTATIN 10 MG PO TABS
20.0000 mg | ORAL_TABLET | Freq: Every day | ORAL | Status: DC
  Administered 2018-09-13 – 2018-09-14 (×2): 20 mg
  Filled 2018-09-12 (×2): qty 2

## 2018-09-12 MED ORDER — VITAL HIGH PROTEIN PO LIQD
1000.0000 mL | ORAL | Status: DC
  Administered 2018-09-12: 12:00:00 1000 mL

## 2018-09-12 MED ORDER — FUROSEMIDE 10 MG/ML IJ SOLN
40.0000 mg | Freq: Four times a day (QID) | INTRAMUSCULAR | Status: AC
  Administered 2018-09-12 – 2018-09-13 (×3): 40 mg via INTRAVENOUS
  Filled 2018-09-12 (×3): qty 4

## 2018-09-12 MED ORDER — DOCUSATE SODIUM 50 MG/5ML PO LIQD
100.0000 mg | Freq: Every day | ORAL | Status: DC
  Administered 2018-09-12 – 2018-09-14 (×3): 100 mg
  Filled 2018-09-12 (×3): qty 10

## 2018-09-12 NOTE — Progress Notes (Addendum)
Progress Note  Patient Name: [name] [facility] Date of Encounter: 09/12/2018  Primary Cardiologist: Buford Dresser, MD   Subjective   Pt intubated. Following commands.   Inpatient Medications    Scheduled Meds:  amLODipine  10 mg Per Tube Daily   budesonide (PULMICORT) nebulizer solution  0.25 mg Nebulization BID   chlorhexidine  15 mL Mouth Rinse BID   chlorhexidine gluconate (MEDLINE KIT)  15 mL Mouth Rinse BID   Chlorhexidine Gluconate Cloth  6 each Topical Daily   docusate sodium  100 mg Oral BID   folic acid  0.5 mg Oral Daily   furosemide  80 mg Intravenous Q12H   heparin  5,000 Units Subcutaneous Q8H   hydrALAZINE  50 mg Oral Q8H   insulin aspart  0-15 Units Subcutaneous TID WC   insulin aspart  0-5 Units Subcutaneous QHS   insulin detemir  10 Units Subcutaneous QHS   ipratropium-albuterol  3 mL Nebulization TID   iron polysaccharides  150 mg Oral BID   mouth rinse  15 mL Mouth Rinse 10 times per day   montelukast  10 mg Oral QHS   multivitamin  1 tablet Oral Daily   nitroGLYCERIN  1 inch Topical Q8H   omega-3 acid ethyl esters  1 g Oral Daily   pantoprazole (PROTONIX) IV  40 mg Intravenous Daily   pyridOXINE  100 mg Oral Daily   senna  1 tablet Oral BID   simvastatin  20 mg Oral Daily   traZODone  50 mg Oral QHS   vitamin B-12  1,000 mcg Oral Daily   vitamin E  400 Units Oral Daily   Continuous Infusions:  dexmedetomidine (PRECEDEX) IV infusion Stopped (09/11/18 2358)   dextrose 5 % and 0.9% NaCl 50 mL/hr at 09/12/18 0816   fentaNYL infusion INTRAVENOUS 25 mcg/hr (09/12/18 0816)   norepinephrine (LEVOPHED) Adult infusion Stopped (09/11/18 2336)   PRN Meds: acetaminophen **OR** acetaminophen, alum & mag hydroxide-simeth, cyclobenzaprine, hydrALAZINE, oxyCODONE-acetaminophen   Vital Signs    Vitals:   09/12/18 0700 09/12/18 0800 09/12/18 0900 09/12/18 1000  BP: (!) 126/48 (!) 160/45 (!) 140/55 (!) 148/56  Pulse: 62  63 73 66  Resp: '20 20 20 20  ' Temp: 99.5 F (37.5 C) 99.5 F (37.5 C) 99.7 F (37.6 C) 99.5 F (37.5 C)  TempSrc:      SpO2: 97% 97% 98% 95%  Weight:      Height:        Intake/Output Summary (Last 24 hours) at 09/12/2018 1135 Last data filed at 09/12/2018 0816 Gross per 24 hour  Intake 913.36 ml  Output 1650 ml  Net -736.64 ml   Filed Weights   09/10/18 0522 09/11/18 0500 09/12/18 0500  Weight: 79.3 kg 84.7 kg 82.8 kg    Physical Exam   General: Intubated, sedated  Lungs: Bilateral rhonchi in upper and lower lobes. ETT in place Cardiovascular: RRR with S1 S2. + murmurs Abdomen: Soft, non-tender, non-distended. No obvious abdominal masses. Extremities: Mild 1+ BLE edema. No clubbing or cyanosis. DP pulses 1+ bilaterally Neuro: Intubated. No focal deficits. No facial asymmetry. MAE spontaneously. Psych: Following commands   Labs    Chemistry Recent Labs  Lab 09/10/18 0515 09/11/18 0230 09/12/18 0205  NA 138 140 142  K 5.3* 5.5* 5.2*  CL 97* 98 101  CO2 32 32 31  GLUCOSE 149* 217* 100*  BUN 108* 119* 122*  CREATININE 2.49* 2.57* 2.94*  CALCIUM 8.9 9.2 8.7*  GFRNONAA 18*  17* 15*  GFRAA 21* 20* 17*  ANIONGAP '9 10 10     ' Hematology Recent Labs  Lab 09/10/18 0515 09/11/18 0230 09/12/18 0205  WBC 7.4 7.6 7.5  RBC 3.17* 3.19* 2.87*  HGB 9.2* 9.1* 8.2*  HCT 30.3* 31.6* 27.2*  MCV 95.6 99.1 94.8  MCH 29.0 28.5 28.6  MCHC 30.4 28.8* 30.1  RDW 17.3* 17.3* 18.5*  PLT 228 228 240    Cardiac EnzymesNo results for input(s): TROPONINI in the last 168 hours. No results for input(s): TROPIPOC in the last 168 hours.   BNPNo results for input(s): BNP, PROBNP in the last 168 hours.   DDimer No results for input(s): DDIMER in the last 168 hours.   Radiology    Dg Chest 1 View  Result Date: 09/11/2018 CLINICAL DATA:  New left central line EXAM: CHEST  1 VIEW COMPARISON:  09/11/2018, 11:57 a.m. FINDINGS: Interval placement of a left subclavian vascular catheter, tip  projecting over the right atrium. Otherwise unchanged examination with endotracheal tube just above the carina, esophagogastric tube tip and side port below the diaphragm, cardiomegaly, pulmonary vascular prominence, retrocardiac opacity, and a possible small left pleural effusion. IMPRESSION: Interval placement of a left subclavian vascular catheter, tip projecting over the right atrium. Otherwise unchanged AP portable examination. Electronically Signed   By: Eddie Candle M.D.   On: 09/11/2018 14:03   Dg Abd 1 View  Result Date: 09/11/2018 CLINICAL DATA:  ETT, OG placement EXAM: PORTABLE CHEST 1 VIEW COMPARISON:  09/10/2018 FINDINGS: Interval placement of endotracheal tube, tip projecting above the carina. Otherwise unchanged AP portable examination of the chest with cardiomegaly, pulmonary vascular prominence, retrocardiac opacity reflecting atelectasis or consolidation, and a possible small left pleural effusion. Interval placement of orogastric tube with tip and side port below the diaphragm. Unremarkable pattern of partially imaged bowel gas. IMPRESSION: 1. Interval placement of endotracheal tube, tip projecting above the carina. 2. Otherwise unchanged AP portable examination of the chest with cardiomegaly, pulmonary vascular prominence, retrocardiac opacity reflecting atelectasis or consolidation, and a possible small left pleural effusion. 3. Interval placement of orogastric tube with tip and side port below the diaphragm. Electronically Signed   By: Eddie Candle M.D.   On: 09/11/2018 12:14   Dg Chest Port 1 View  Result Date: 09/12/2018 CLINICAL DATA:  Respiratory failure. EXAM: PORTABLE CHEST 1 VIEW COMPARISON:  Radiograph of September 11, 2018. FINDINGS: Stable cardiomegaly. Endotracheal tube is slightly directed toward right mainstem bronchus; withdrawal by 2-3 cm is recommended. Nasogastric tube is unchanged in position. Left subclavian catheter is unchanged. No definite pneumothorax is noted.  Stable bibasilar atelectasis or infiltrates are noted. Possible small left pleural effusion is noted. Bony thorax is unremarkable. IMPRESSION: Endotracheal tube is slightly directed toward right mainstem bronchus; withdrawal by 2-3 cm is recommended. Mild bibasilar opacities are noted concerning for atelectasis or infiltrates, with possible small left pleural effusion. These results will be called to the ordering clinician or representative by the Radiologist Assistant, and communication documented in the PACS or zVision Dashboard. Electronically Signed   By: Marijo Conception M.D.   On: 09/12/2018 08:24   Dg Chest Port 1 View  Result Date: 09/11/2018 CLINICAL DATA:  ETT, OG placement EXAM: PORTABLE CHEST 1 VIEW COMPARISON:  09/10/2018 FINDINGS: Interval placement of endotracheal tube, tip projecting above the carina. Otherwise unchanged AP portable examination of the chest with cardiomegaly, pulmonary vascular prominence, retrocardiac opacity reflecting atelectasis or consolidation, and a possible small left pleural effusion. Interval placement of  orogastric tube with tip and side port below the diaphragm. Unremarkable pattern of partially imaged bowel gas. IMPRESSION: 1. Interval placement of endotracheal tube, tip projecting above the carina. 2. Otherwise unchanged AP portable examination of the chest with cardiomegaly, pulmonary vascular prominence, retrocardiac opacity reflecting atelectasis or consolidation, and a possible small left pleural effusion. 3. Interval placement of orogastric tube with tip and side port below the diaphragm. Electronically Signed   By: Eddie Candle M.D.   On: 09/11/2018 12:14   Dg Chest Port 1 View  Result Date: 09/10/2018 CLINICAL DATA:  Hypoxia. EXAM: PORTABLE CHEST 1 VIEW COMPARISON:  09/07/2018 FINDINGS: Patient's chin obscures the apices. Unchanged cardiomegaly. Pulmonary edema appears similar. Worsening retrocardiac and right lung base opacity. No pneumothorax.  Degenerative change of the shoulders. IMPRESSION: 1. Worsening retrocardiac and right lung base opacity which may represent atelectasis and/or pleural fluid, particularly on the left. 2. Similar cardiomegaly and pulmonary edema. Electronically Signed   By: Keith Rake M.D.   On: 09/10/2018 23:03   Telemetry    09/12/2018 NST/ST 80-100- Personally Reviewed  ECG    No new tracing as of 09/12/2018- Personally Reviewed  Cardiac Studies   Echocardiogram 09/11/2018:  1. The left ventricle has hyperdynamic systolic function, with an ejection fraction of >65%. The cavity size was normal. There is moderate concentric left ventricular hypertrophy. Left ventricular diastolic Doppler parameters are consistent with  pseudonormalization. Elevated mean left atrial pressure No evidence of left ventricular regional wall motion abnormalities.  2. The right ventricle has normal systolic function. The cavity was mildly enlarged. There is mildly increased right ventricular wall thickness. Right ventricular systolic pressure is moderately elevated.  3. Left atrial size was moderately dilated.  4. Right atrial size was mildly dilated.  5. Small pericardial effusion.  6. The pericardial effusion is circumferential.  7. The MR jet is centrally-directed.  8. The aortic valve is tricuspid. Mild thickening of the aortic valve. Moderate calcification of the aortic valve. Mild-moderate stenosis of the aortic valve.  9. The aorta is not well visualized unless otherwise noted. 10. The inferior vena cava was dilated in size with <50% respiratory variability.  Patient Profile     78 y.o. female with acute on chronic respiratory failure/hypoxia, longstanding severe COPD, chronic pulmonary artery hypertension, acute on chronic diastolic heart failure moderate aortic stenosis, mitral insufficiency with varying severity, CKD stage IV, DM  Assessment & Plan    1.  Acute hypoxic respiratory failure in the setting of COPD  exacerbation: -More likely COPD exacerbation with respiratory failure versus diastolic HF>> intubated per [facility] 09/11/2018 with full vent support secondary to acute respiratory failure after failed BiPAP -Echocardiogram with LVEF of greater than 65% with no evidence of left ventricular regional wall motion abnormalities. Right ventricular systolic pressure is moderately elevated.  There is evidence of pericardial effusion which was noted to be circumferential.  MR jet centrally directed and there is mild to moderate stenosis of the aortic valve. -Currently intubated and sedated per PCCM for acute respiratory>>following commands -Follow therapy for COPD exacerbation and respiratory failure -Hold lasix for today given worsening renal function.    2.  Aortic stenosis, mitral regurgitation: -On previous echocardiogram gradients exaggerated by markedly hyperdynamic LVEF, moderate MR on last TTE -Echocardiogram 09/11/2018 with MR which was noted to have a centrally directed jet, aortic stenosis documented as mild to moderate -Monitor for now with serial echocardiograms, no need for further intervention at this time  3. Chronic diastolic HF: -Echocardiogram from 09/11/2018  with an ejection fraction of >65%. There was pericardial effusion is circumferential. MR jet is centrally-directed. Aortic valve with mild-moderate stenosis of the aortic valve.   4.  COPD: -On BiPAP 09/11/2018>>intubated for respiratory failure after failed Bipap therapy  -Continue Pulmicort, albuterol, DuoNeb>> Solu-Medrol on hold -No longer on vasopressors  -CCM managing   4.  CKD stage IV: -Creatinine, 2.30, 2.49, 2.57> 2.94 today -Monitor creatinine closely with daily labs  5.  DM2: -Per primary team -SSI for glucose control while inpatient status   Signed, Kathyrn Drown NP-C [facility] Pager: [phone] 09/12/2018, 11:35 AM     For questions or updates, please contact   Please consult [email or web address] for contact info  under Cardiology/STEMI.  Patient seen and examined.  Agree with above documentation.  Patient remains intubated, following commands today.  On exam mild bilateral lower extremity edema, regular rate and rhythm,no murmurs, bilateral rhonchi.  Telemetry personally reviewed, shows sinus rhythm with rates 60s to 80s.  Renal function worsening, up to creatinine 2.9 today.  Has been net -7 L on admission.  Will hold Lasix for today given worsening renal function .  Suspect she is still volume overloaded, can proceed with cautious diuresis and monitor renal function if creatinine stable.

## 2018-09-12 NOTE — Progress Notes (Signed)
PT Cancellation Note  Patient Details Name: [name] MRN: [id number] DOB: [birth year]   Cancelled Treatment:    Reason Eval/Treat Not Completed: Medical issues which prohibited therapy--pt now on vent. Will hold PT for now. Will check back later in the week.   Weston Anna, PT Acute Rehabilitation Services Pager: [phone] Office: [phone]

## 2018-09-12 NOTE — Consult Note (Signed)
Consultation Note Date: 09/12/2018   Patient Name: [name]  DOB: 1940/10/08  MRN: [id number]  Age / Sex: 78 y.o., female  PCP: Garwin Brothers, MD Referring Physician: Rush Farmer, MD  Reason for Consultation: Establishing goals of care  HPI/Patient Profile: 78 y.o. female   admitted on 09/03/2018   Clinical Assessment and Goals of Care: [name] is a 78 year old lady, who lives at [facility] nursing facility, has past medical history of longstanding severe chronic obstructive pulmonary disease, chronic pulmonary hypertension, acute on chronic diastolic heart failure, moderate aortic stenosis and mitral insufficiency.  She also has underlying stage IV chronic kidney disease and diabetes.  Patient is admitted to stepdown unit at [facility] in [place], [place] with acute, currently ventilator dependent hypoxic respiratory failure deemed secondary to COPD exacerbation along with a component of diastolic heart failure decompensation.  She is being followed by cardiology as well as pulmonary critical care medicine colleagues.  When her respiratory status worsened in spite of being on BiPAP, she was intubated after goals of care discussions were held with her niece who is her next of kin.  Palliative medicine consult has been requested for additional layer of support and for continuation of goals of care discussions based on the patient's hospital course.  Patient is alert and follows commands on the ventilator.  I met with the patient's niece who arrived at the bedside.  Palliative medicine is specialized medical care for people living with serious illness. It focuses on providing relief from the symptoms and stress of a serious illness. The goal is to improve quality of life for both the patient and the family.  Goals of care: Broad aims of medical therapy in relation to the  patient's values and preferences. Our aim is to provide medical care aimed at enabling patients to achieve the goals that matter most to them, given the circumstances of their particular medical situation and their constraints.   Brief life review performed.  Patient was initially at [facility], has been at [facility] for the past several months now.  She has had 2 recent hospitalizations in the past few months.  She has some chronic conditions as listed above.  She was also requiring supplemental oxygen use for her COPD.  We discussed about scope of current hospitalization and next steps.  Patient's niece opines that she remains hopeful for ongoing stabilization/recovery.  Offered active listening and supportive care.  All of her questions addressed to the best of my ability.  See below.  NEXT OF KIN Niece Virgil Benedict [phone]  SUMMARY OF RECOMMENDATIONS   Agree with partial code. Continue current mode of care Discussed with niece present at the bedside who states that she is thankful for the information provided, wishes to continue with current mode of care. Palliative medicine team will follow peripherally and engage as appropriate, depending on hospital course, weaning efforts and her overall disease trajectory. Thank you for the consult.  Code Status/Advance Care Planning:  Limited code  Symptom Management:     Palliative Prophylaxis:   Delirium Protocol   Psycho-social/Spiritual:   Desire for further Chaplaincy support:yes  Additional Recommendations: Caregiving  Support/Resources  Prognosis:   Unable to determine  Discharge Planning: To Be Determined      Primary Diagnoses: Present on Admission:  COPD with asthma ([place])  Anemia of chronic disease  CKD (chronic kidney disease) stage 4, GFR 15-29 ml/min (HCC)  Essential hypertension  CHF (congestive heart failure), NYHA class IV, chronic, combined (HCC)  Acute on chronic combined  systolic (congestive) and diastolic (congestive) heart failure ([place])   I have reviewed the medical record, interviewed the patient and family, and examined the patient. The following aspects are pertinent.  Past Medical History:  Diagnosis Date   Adenomatous colon polyp    Anemia    Arthritis    Asthma    COPD (chronic obstructive pulmonary disease) (HCC)    COPD with asthma ([place]) 04/20/2011   Depression    Diabetes mellitus    Diverticulosis    Gastric ulcer    GERD (gastroesophageal reflux disease)    HTN (hypertension)    Hypercholesterolemia    Internal hemorrhoids    Lumbar disc disease    On home oxygen therapy    "2-3L @ bedtime" (04/04/2017)   OSA (obstructive sleep apnea) 09/17/2012   Peptic ulcer disease    Renal oncocytoma 2011   ablated by [place]   Umbilical hernia    Valgus foot    right    Social History   Socioeconomic History   Marital status: Single    Spouse name: Not on file   Number of children: 0   Years of education: Not on file   Highest education level: Not on file  Occupational History   Occupation: Retired    Comment: Careers adviser strain: Not on file   Food insecurity    Worry: Not on file    Inability: Not on Lexicographer needs    Medical: Not on file    Non-medical: Not on file  Tobacco Use   Smoking status: Former Smoker    Packs/day: 2.00    Years: 5.00    Pack years: 10.00    Types: Cigarettes    Quit date: 01/10/1958    Years since quitting: 60.7   Smokeless tobacco: Never Used  Substance and Sexual Activity   Alcohol use: No    Comment: Quit when she quit smoking   Drug use: No   Sexual activity: Never  Lifestyle   Physical activity    Days per week: Not on file    Minutes per session: Not on file   Stress: Not on file  Relationships   Social connections    Talks on phone: Not on file    Gets together: Not on file    Attends religious  service: Not on file    Active member of club or organization: Not on file    Attends meetings of clubs or organizations: Not on file    Relationship status: Not on file  Other Topics Concern   Not on file  Social History Narrative   Retired from [employer] no children   Sister in [place]   Family History  Problem Relation Age of Onset   Breast cancer Maternal Aunt    Scheduled Meds:  amLODipine  10 mg Per Tube Daily   budesonide (PULMICORT) nebulizer solution  0.5 mg Nebulization  BID   chlorhexidine  15 mL Mouth Rinse BID   chlorhexidine gluconate (MEDLINE KIT)  15 mL Mouth Rinse BID   Chlorhexidine Gluconate Cloth  6 each Topical Daily   docusate  100 mg Per Tube Daily   feeding supplement (PRO-STAT SUGAR FREE 64)  30 mL Per Tube BID   feeding supplement (VITAL HIGH PROTEIN)  1,000 mL Per Tube Z36U   folic acid  0.5 mg Oral Daily   heparin  5,000 Units Subcutaneous Q8H   hydrALAZINE  50 mg Per Tube Q8H   insulin aspart  0-15 Units Subcutaneous TID WC   insulin aspart  0-5 Units Subcutaneous QHS   insulin detemir  10 Units Subcutaneous QHS   ipratropium-albuterol  3 mL Nebulization TID   iron polysaccharides  150 mg Oral BID   mouth rinse  15 mL Mouth Rinse 10 times per day   montelukast  10 mg Per Tube QHS   multivitamin  1 tablet Oral Daily   nitroGLYCERIN  1 inch Topical Q8H   omega-3 acid ethyl esters  1 g Oral Daily   pantoprazole sodium  40 mg Per Tube Q1200   [START ON 09/13/2018] pyridOXINE  100 mg Per Tube Daily   senna  1 tablet Per Tube BID   [START ON 09/13/2018] simvastatin  20 mg Per Tube Daily   traZODone  50 mg Per Tube QHS   [START ON 09/13/2018] vitamin B-12  1,000 mcg Per Tube Daily   [START ON 09/13/2018] vitamin E  400 Units Per Tube Daily   Continuous Infusions:  dexmedetomidine (PRECEDEX) IV infusion Stopped (09/11/18 2358)   dextrose 5 % and 0.9% NaCl 50 mL/hr at 09/12/18 0816   fentaNYL infusion INTRAVENOUS  25 mcg/hr (09/12/18 0816)   PRN Meds:.acetaminophen **OR** acetaminophen, alum & mag hydroxide-simeth, cyclobenzaprine, hydrALAZINE, oxyCODONE-acetaminophen Medications Prior to Admission:  Prior to Admission medications   Medication Sig Start Date End Date Taking? Authorizing Provider  acetaminophen (TYLENOL) 325 MG tablet Take 650 mg by mouth every 6 (six) hours as needed for mild pain.   Yes [provider]  Amino Acids-Protein Hydrolys (FEEDING SUPPLEMENT, PRO-STAT SUGAR FREE 64,) LIQD Take 30 mLs by mouth 2 (two) times daily. 06/20/18  Yes [place] Eric J, DO  amLODipine (NORVASC) 10 MG tablet Take 10 mg by mouth daily.    Yes [provider]  calcitRIOL (ROCALTROL) 0.25 MCG capsule Take 1 capsule (0.25 mcg total) by mouth every Monday, Wednesday, and Friday. Patient taking differently: Take 0.25 mcg by mouth daily.  12/05/16  Yes Velvet Bathe, MD  cyclobenzaprine (FLEXERIL) 10 MG tablet Take 10 mg 3 (three) times daily as needed by mouth for muscle spasms.   Yes [provider]  feeding supplement, GLUCERNA SHAKE, (GLUCERNA SHAKE) LIQD Take 237 mLs by mouth 2 (two) times daily between meals. 08/02/18  Yes Mikhail, Velta Addison, DO  folic acid (FOLVITE) 440 MCG tablet Take 400 mcg by mouth daily.   Yes [provider]  Insulin Aspart (NOVOLOG FLEXPEN [place]) Inject 0-10 Units into the skin See admin instructions. Per Sliding Scale ar 0600, 1100, 1600, 2100 100-150 0units 151-200 2units 201-250 4units 251-300 6units 301-350 8units 351-400 10units   Yes [provider]  insulin aspart (NOVOLOG) 100 UNIT/ML injection Inject 6 Units into the skin 3 (three) times daily with meals. 06/20/18  Yes [place] Eric J, DO  insulin glargine (LANTUS) 100 UNIT/ML injection Inject 0.15 mLs (15 Units total) into the skin at bedtime. 08/02/18  Yes Mikhail,  Maryann, DO  ipratropium-albuterol (DUONEB) 0.5-2.5 (3) MG/3ML SOLN Take 3 mLs by nebulization every 6 (six) hours as  needed (SOB, wheezing).   Yes [provider]  iron polysaccharides (NIFEREX) 150 MG capsule Take 150 mg by mouth 2 (two) times daily.   Yes [provider]  magnesium oxide (MAG-OX) 400 MG tablet Take 400 mg by mouth daily.   Yes [provider]  montelukast (SINGULAIR) 10 MG tablet Take 1 tablet (10 mg total) by mouth at bedtime. 10/05/16  Yes Chesley Mires, MD  Multiple Vitamins-Minerals (MULTIVITAMIN ADULT PO) Take 1 tablet by mouth daily.   Yes [provider]  nitroGLYCERIN (NITROSTAT) 0.4 MG SL tablet Place 0.4 mg under the tongue every 5 (five) minutes as needed for chest pain.   Yes [provider]  Omega-3 Fatty Acids (FISH OIL) 1000 MG CAPS Take 1,000 mg by mouth daily.    Yes [provider]  oxyCODONE-acetaminophen (PERCOCET/ROXICET) 5-325 MG tablet Take 1 tablet by mouth every 6 (six) hours as needed for severe pain. 08/02/18  Yes Mikhail, Velta Addison, DO  pantoprazole (PROTONIX) 40 MG tablet Take 1 tablet (40 mg total) by mouth daily at 6 (six) AM. 08/03/18  Yes Mikhail, Velta Addison, DO  Propylene Glycol (SYSTANE BALANCE) 0.6 % SOLN Apply 1 drop to eye 2 (two) times daily.    Yes [provider]  pyridOXINE (VITAMIN B-6) 100 MG tablet Take 100 mg by mouth daily.   Yes [provider]  torsemide (DEMADEX) 10 MG tablet Take 2 tablets (20 mg total) by mouth 2 (two) times daily. 07/09/18  Yes Domenic Polite, MD  traZODone (DESYREL) 50 MG tablet Take 50 mg by mouth at bedtime.    Yes [provider]  vitamin B-12 (CYANOCOBALAMIN) 1000 MCG tablet Take 1,000 mcg by mouth daily.   Yes [provider]  Vitamin D, Ergocalciferol, (DRISDOL) 50000 UNITS CAPS capsule Take 50,000 Units by mouth 2 (two) times a week. MON and THUR 04/07/14  Yes [provider]  vitamin E 400 UNIT capsule Take 400 Units by mouth daily.   Yes [provider]  ZOCOR 20 MG tablet Take 20 mg by mouth daily. 03/16/14  Yes [provider]  multivitamin (PROSIGHT) TABS tablet Take 1 tablet by mouth daily. Patient not taking: Reported on 09/03/2018 08/02/18   Cristal Ford, DO   Allergies  Allergen Reactions   Other Other (See Comments)    "all generics make her sick"   Review of Systems Not able to verbalize them due to being currently intubated and mechanically ventilated.  Physical Exam Patient is awake, sitting up propped up in bed. Patient has endotracheal tube and is on mechanical ventilation Few coarse rhonchorous breath sounds S1-S2 Has trace edema Intubated, follows commands, is able to mouth words appropriately and interact on some level with her niece who is present at the bedside  Vital Signs: BP (!) 148/56    Pulse 66    Temp 99.5 F (37.5 C)    Resp 20    Ht '5\' 5"'  (1.651 m)    Wt 82.8 kg    SpO2 92%    BMI 30.38 kg/m  Pain Scale: CPOT POSS *See Group Information*: S-Acceptable,Sleep, easy to arouse Pain Score: Asleep   SpO2: SpO2: 92 % O2 Device:SpO2: 92 % O2 Flow Rate: .O2 Flow Rate (L/min): 5 L/min  IO: Intake/output summary:   Intake/Output Summary (Last 24 hours) at 09/12/2018 1330 Last data filed at 09/12/2018 0816 Gross per  24 hour  Intake 903.73 ml  Output 1650 ml  Net -746.27 ml    LBM: Last BM Date: 09/11/18 Baseline Weight: Weight: 85.2 kg Most recent weight: Weight: 82.8 kg     Palliative Assessment/Data:   PPS 30%  Time In:  1400 Time Out:  1500 Time Total:  60 Greater than 50%  of this time was spent counseling and coordinating care related to the above assessment and plan.  Signed by: Loistine Chance, MD  [phone] Please contact Palliative Medicine Team phone at [phone] for questions and concerns.  For individual provider: See Shea Evans

## 2018-09-12 NOTE — Progress Notes (Signed)
Nutrition Follow-up  DOCUMENTATION CODES:   Obesity unspecified  INTERVENTION:  - will adjust TF regimen: Vital High Protein @ 55 ml/hr with 30 ml prostat once/day. - this regimen will provide 1420 kcal, 130 grams protein, and 1103 ml free water.  - free water flush, if desired, to be per MD/NP given need for IV lasix.    NUTRITION DIAGNOSIS:   Increased nutrient needs related to wound healing as evidenced by estimated needs. -ongoing  GOAL:   Patient will meet greater than or equal to 90% of their needs -unmet at this time  MONITOR:   Vent status, TF tolerance, Labs, Weight trends, Skin  REASON FOR ASSESSMENT:   Consult Enteral/tube feeding initiation and management  ASSESSMENT:   [name] with medical hx of CHF (recent 2D echo with normal EF), type 2 DM, COPD with chronic respiratory failure on nocturnal home O2, anemia, OSA, PUD, HTN, and hyperlipidemia. She presented to the ED on 8/24 d/t SOB for several days. She was noted to be hypoxic with O2 sat of 88% on 4L nasal cannula. EMS noted patient had wheezing and rales and treated with epinephrine and nitroglycerin. She was found tachypneic and hypoxic with acute on CHF with CXR showed pulmonary vascular congestion. Also noted for acute on CKD and hyperkalemia. Given IV Lasix 80 mg x 1 and admitted to hospital service.  Weight is beginning to trend back down. Patient remains intubated with OGT in place and patient is currently receiving TF per protocol: Vital High Protein @ 40 ml/hr with 30 ml prostat BID. This regimen is providing 1160 kcal, 114 grams protein, and 802 ml free water.   A family member was at the bedside at the time of RD visit. She was on Facetime with several other family members so they could see the patient. Family member denied any nutrition-related questions or concerns.  Per notes: - acute hypoxemic respiratory failure--pulmonary edema, pulmonary HTN - AKI - acute metabolic  encephalopathy - at risk for malnutrition--TF initiation today   Patient is currently intubated on ventilator support MV: 10.1 L/min Temp (24hrs), Avg:99.4 F (37.4 C), Min:98.7 F (37.1 C), Max:100 F (37.8 C) Propofol: none  Labs reviewed; CBGs: 107, 119, and 113 mg/dl today, K: 5.2 mmol/l, BUN: 122 mg/dl, creatinine: 2.94 mg/dl, Ca: 8.7 mg/dl, Mg: 2.5 mg/dl, GFR: 17 ml/min.  Medications reviewed; 0.5 mg folvite/day, 40 mg IV lasix x3 doses 9/2, sliding scale novolog, 10 units levemir/day, 1 tablet prosight/day, 40 mg protonix per OGT/day, 1 tablet senokot BID, 10 g lokelma x1 dose 9/2, 1000 mcg cyanocobalamin per OGT/day, 400 units vitamin E/day.  IVF; D5-NS @ 50 ml/hr (204 kcal). Drip; fentanyl @ 25 mcg/hr.   Diet Order:   Diet Order            Diet NPO time specified  Diet effective now              EDUCATION NEEDS:   No education needs have been identified at this time  Skin:  Skin Assessment: Skin Integrity Issues: Skin Integrity Issues:: Stage II, Stage III, Unstageable Stage II: L hip and mid sacrum Stage III: R heel Unstageable: L foot and L heel  Last BM:  9/1  Height:   Ht Readings from Last 1 Encounters:  09/05/18 5\' 5"  (1.651 m)    Weight:   Wt Readings from Last 1 Encounters:  09/12/18 82.8 kg    Ideal Body Weight:  56.8 kg  BMI:  Body mass  index is 30.38 kg/m.  Estimated Nutritional Needs:   Kcal:  1472 kcal  Protein:  125-135 grams  Fluid:  >/= 2.2 L/day     Jarome Matin, MS, RD, LDN, [facility] Inpatient Clinical Dietitian Pager # [phone] After hours/weekend pager # [phone]

## 2018-09-12 NOTE — Progress Notes (Addendum)
NAME:  [name], MRN:  [id number], DOB:  Oct 10, 1940, LOS: 9 ADMISSION DATE:  09/03/2018, CONSULTATION DATE:  09/11/2018 REFERRING MD:  TRH-Pokhrel, CHIEF COMPLAINT:  Acute respiratory failure   Brief History   78 year old female with PMH of COPD and pulmonary HTN who presents with COPD exacerbation and respiratory failure.  History from records.  Severe COPD and pulmonary HTN.  Presenting with acute on chronic hypoxemic respiratory failure.  Past Medical History  CHF COPD Pulmonary HTN  Significant Hospital Events   8/24 Admit  9/01 Intubated for respiratory distress  Consults:  PCCM  Procedures:  ETT 9/1 >> L TLC 9/1 >>   Significant Diagnostic Tests:  ECHO 9/1 >> LVEF >65%, RV with normal systolic function, cavity mildly enlarged, RA mildly dilated, small circumferential pericardial effusion  Micro Data:  COVID 8/24 >> negative MRSA 8/24 >>positive COVID 8/31 >> negative  Antimicrobials:    Interim history/subjective:  Tmax 99.5.  RT reports pt turned to wean on PSV 10/5 around 1150.  No acute events overnight. I/O - 1.6L UOP in 24/hr, net neg 871 ml in 24/hr.  Net neg 7.2L since admit.   Objective   Blood pressure (!) 148/56, pulse 66, temperature 99.5 F (37.5 C), resp. rate 20, height 5\' 5"  (1.651 m), weight 82.8 kg, SpO2 95 %.    Vent Mode: PRVC FiO2 (%):  [40 %-100 %] 40 % Set Rate:  [20 bmp] 20 bmp Vt Set:  [450 mL] 450 mL PEEP:  [5 cmH20-10 cmH20] 5 cmH20 Plateau Pressure:  [17 cmH20-25 cmH20] 18 cmH20   Intake/Output Summary (Last 24 hours) at 09/12/2018 1056 Last data filed at 09/12/2018 0816 Gross per 24 hour  Intake 913.36 ml  Output 1650 ml  Net -736.64 ml   Filed Weights   09/10/18 0522 09/11/18 0500 09/12/18 0500  Weight: 79.3 kg 84.7 kg 82.8 kg    Examination: General: elderly female lying in bed on vent in NAD  HEENT: MM pink/moist, ETT Neuro: Awake, alert, interactive  CV: E7M0 rrr, holostystolic murmur   PULM: even/non-labored on  PSV ventilation, lungs bilaterally clear anterior, diminished bases  GI: soft, bsx4 active  Extremities: warm/dry, trace LE edema, BLE with evidence of chronic venous stasis, bilateral feet wrapped in gauze  Skin: no rashes or lesions  CXR 9/2 > independently reviewed, mild effusion on left, bibasilar atelectasis   Resolved Hospital Problem list      Assessment & Plan:  78 year old female with extensive PMH who presents to PCCM with respiratory failure that is likely multi-factorial with AMS and inability to protect her airway.  Discussed with PCCM-NP.    Acute Hypoxemic Respiratory Failure - in setting of pulmonary edema, pulmonary HTN, r/o infectious etiology  P: PRVC 8cc/kg  Wean PEEP / fiO2 for sats >88% Follow intermittent CXR  Reduce lasix 9/2 with rise in sr cr / BUN  Pulmonary HTN -PASP on ECHO 60, no RHC on file  P: Supportive care O2 to support sats >88% Doubt she would be a candidate for vasodilators   COPD P: Adjust pulmicort dosing  Continue duoneb, albuterol  No evidence of exacerbation, hold steroids   AKI  P: Trend BMP / urinary output Replace electrolytes as indicated Avoid nephrotoxic agents, ensure adequate renal perfusion  Acute Metabolic Encephalopathy  P: Precedex for sedation, fentanyl for pain  Monitor neuro exam, non-focal    At Risk Malnutrition  P: Begin TF per Nutrition   GOC: Niece updated 9/1 per  Dr. Nelda Marseille > further discussion with limited code status.    Labs   CBC: Recent Labs  Lab 09/08/18 0419 09/09/18 0511 09/10/18 0515 09/11/18 0230 09/12/18 0205  WBC 6.5 6.7 7.4 7.6 7.5  HGB 8.1* 8.6* 9.2* 9.1* 8.2*  HCT 27.1* 29.1* 30.3* 31.6* 27.2*  MCV 97.1 97.3 95.6 99.1 94.8  PLT 222 220 228 228 765    Basic Metabolic Panel: Recent Labs  Lab 09/08/18 0419 09/09/18 0511 09/10/18 0515 09/11/18 0230 09/12/18 0205  NA 140 140 138 140 142  K 5.0 4.7 5.3* 5.5* 5.2*  CL 100 99 97* 98 101  CO2 29 33* 32 32 31  GLUCOSE  126* 161* 149* 217* 100*  BUN 97* 100* 108* 119* 122*  CREATININE 2.23* 2.30* 2.49* 2.57* 2.94*  CALCIUM 8.7* 9.0 8.9 9.2 8.7*  MG  --   --   --   --  2.5*  PHOS  --   --   --   --  3.2   GFR: Estimated Creatinine Clearance: 17.3 mL/min (A) (by C-G formula based on SCr of 2.94 mg/dL (H)). Recent Labs  Lab 09/09/18 0511 09/10/18 0515 09/11/18 0230 09/12/18 0205  WBC 6.7 7.4 7.6 7.5    Liver Function Tests: No results for input(s): AST, ALT, ALKPHOS, BILITOT, PROT, ALBUMIN in the last 168 hours. No results for input(s): LIPASE, AMYLASE in the last 168 hours. No results for input(s): AMMONIA in the last 168 hours.  ABG    Component Value Date/Time   PHART 7.511 (H) 09/12/2018 0355   PCO2ART 42.7 09/12/2018 0355   PO2ART 67.5 (L) 09/12/2018 0355   HCO3 33.6 (H) 09/12/2018 0355   TCO2 22 09/03/2014 0002   ACIDBASEDEF 1.3 07/27/2018 0421   O2SAT 93.8 09/12/2018 0355     Coagulation Profile: No results for input(s): INR, PROTIME in the last 168 hours.  Cardiac Enzymes: No results for input(s): CKTOTAL, CKMB, CKMBINDEX, TROPONINI in the last 168 hours.  HbA1C: Hgb A1c MFr Bld  Date/Time Value Ref Range Status  06/16/2018 08:15 PM 9.9 (H) 4.8 - 5.6 % Final    Comment:    (NOTE)         Prediabetes: 5.7 - 6.4         Diabetes: >6.4         Glycemic control for adults with diabetes: <7.0   04/04/2017 02:04 AM 5.8 (H) 4.8 - 5.6 % Final    Comment:    (NOTE)         Prediabetes: 5.7 - 6.4         Diabetes: >6.4         Glycemic control for adults with diabetes: <7.0     CBG: Recent Labs  Lab 09/11/18 1719 09/11/18 2136 09/11/18 2341 09/12/18 0348 09/12/18 0720  GLUCAP 82 72 92 107* 119*    CC Time: 48 minutes    Noe Gens, NP-C [facility] Pgr: [phone] or if no answer [phone] 09/12/2018, 10:56 AM  Attending Note:  78 year old female with COPD and pulmonary HTN who presents with respiratory failure due to COPD exacerbation in  a patient with pulmonary HTN.  On exam, she is arousable but not weaning due to O2 and PEEP demand.  I reviewed CXR myself, ETT is ok with pleural effusion and pulmonary edema noted.  Discussed with PCCM-NP.  Will continue active diureses today.  Replace electrolytes.  BMET in AM and later this afternoon for hyperkalemia.  Lokelma  ordered.  Will consult renal in AM for ?dialysis.  The patient is critically ill with multiple organ systems failure and requires high complexity decision making for assessment and support, frequent evaluation and titration of therapies, application of advanced monitoring technologies and extensive interpretation of multiple databases.   Critical Care Time devoted to patient care services described in this note is  32  Minutes. This time reflects time of care of this signee Dr [name]. This critical care time does not reflect procedure time, or teaching time or supervisory time of PA/NP/Med student/Med Resident etc but could involve care discussion time.  Rush Farmer, M.D. [facility] Medicine. Pager: [phone]. After hours pager: [phone].

## 2018-09-13 ENCOUNTER — Inpatient Hospital Stay (HOSPITAL_COMMUNITY): Payer: Medicare Other

## 2018-09-13 DIAGNOSIS — G9340 Encephalopathy, unspecified: Secondary | ICD-10-CM

## 2018-09-13 LAB — CBC
HCT: 27.2 % — ABNORMAL LOW (ref 36.0–46.0)
Hemoglobin: 8.1 g/dL — ABNORMAL LOW (ref 12.0–15.0)
MCH: 28.3 pg (ref 26.0–34.0)
MCHC: 29.8 g/dL — ABNORMAL LOW (ref 30.0–36.0)
MCV: 95.1 fL (ref 80.0–100.0)
Platelets: 215 10*3/uL (ref 150–400)
RBC: 2.86 MIL/uL — ABNORMAL LOW (ref 3.87–5.11)
RDW: 18.5 % — ABNORMAL HIGH (ref 11.5–15.5)
WBC: 10.1 10*3/uL (ref 4.0–10.5)
nRBC: 0 % (ref 0.0–0.2)

## 2018-09-13 LAB — BLOOD GAS, ARTERIAL
Acid-Base Excess: 10 mmol/L — ABNORMAL HIGH (ref 0.0–2.0)
Bicarbonate: 34.9 mmol/L — ABNORMAL HIGH (ref 20.0–28.0)
Drawn by: 232811
FIO2: 40
MECHVT: 450 mL
O2 Saturation: 88.4 %
PEEP: 5 cmH2O
Patient temperature: 37.5
RATE: 16 resp/min
pCO2 arterial: 51.7 mmHg — ABNORMAL HIGH (ref 32.0–48.0)
pH, Arterial: 7.446 (ref 7.350–7.450)
pO2, Arterial: 58.1 mmHg — ABNORMAL LOW (ref 83.0–108.0)

## 2018-09-13 LAB — COMPREHENSIVE METABOLIC PANEL
ALT: 13 U/L (ref 0–44)
AST: 13 U/L — ABNORMAL LOW (ref 15–41)
Albumin: 2.8 g/dL — ABNORMAL LOW (ref 3.5–5.0)
Alkaline Phosphatase: 45 U/L (ref 38–126)
Anion gap: 10 (ref 5–15)
BUN: 110 mg/dL — ABNORMAL HIGH (ref 8–23)
CO2: 33 mmol/L — ABNORMAL HIGH (ref 22–32)
Calcium: 8.6 mg/dL — ABNORMAL LOW (ref 8.9–10.3)
Chloride: 100 mmol/L (ref 98–111)
Creatinine, Ser: 2.86 mg/dL — ABNORMAL HIGH (ref 0.44–1.00)
GFR calc Af Amer: 18 mL/min — ABNORMAL LOW (ref >60)
GFR calc non Af Amer: 15 mL/min — ABNORMAL LOW (ref >60)
Glucose, Bld: 191 mg/dL — ABNORMAL HIGH (ref 70–99)
Potassium: 4.1 mmol/L (ref 3.5–5.1)
Sodium: 143 mmol/L (ref 135–145)
Total Bilirubin: 0.5 mg/dL (ref 0.3–1.2)
Total Protein: 6.5 g/dL (ref 6.5–8.1)

## 2018-09-13 LAB — GLUCOSE, CAPILLARY
Glucose-Capillary: 124 mg/dL — ABNORMAL HIGH (ref 70–99)
Glucose-Capillary: 169 mg/dL — ABNORMAL HIGH (ref 70–99)
Glucose-Capillary: 189 mg/dL — ABNORMAL HIGH (ref 70–99)
Glucose-Capillary: 191 mg/dL — ABNORMAL HIGH (ref 70–99)
Glucose-Capillary: 195 mg/dL — ABNORMAL HIGH (ref 70–99)
Glucose-Capillary: 225 mg/dL — ABNORMAL HIGH (ref 70–99)

## 2018-09-13 LAB — MAGNESIUM
Magnesium: 2.3 mg/dL (ref 1.7–2.4)
Magnesium: 2.4 mg/dL (ref 1.7–2.4)

## 2018-09-13 LAB — PHOSPHORUS
Phosphorus: 4.2 mg/dL (ref 2.5–4.6)
Phosphorus: 4 mg/dL (ref 2.5–4.6)

## 2018-09-13 MED ORDER — METOLAZONE 5 MG PO TABS
10.0000 mg | ORAL_TABLET | Freq: Once | ORAL | Status: AC
  Administered 2018-09-13: 10 mg via ORAL
  Filled 2018-09-13: qty 2

## 2018-09-13 MED ORDER — FUROSEMIDE 10 MG/ML IJ SOLN: 40.0000 mg | Freq: Once | INTRAMUSCULAR | Status: DC

## 2018-09-13 MED ORDER — FUROSEMIDE 10 MG/ML IJ SOLN
40.0000 mg | Freq: Four times a day (QID) | INTRAMUSCULAR | Status: AC
  Administered 2018-09-13 (×2): 40 mg via INTRAVENOUS
  Filled 2018-09-13 (×2): qty 4

## 2018-09-13 NOTE — Progress Notes (Addendum)
Progress Note  Patient Name: [name] [facility] Date of Encounter: 09/13/2018  Primary Cardiologist: Buford Dresser, MD  Subjective   Remains intubated but following commands. More alert today. Denies pain. Weaning on vent   Inpatient Medications    Scheduled Meds:  amLODipine  10 mg Per Tube Daily   budesonide (PULMICORT) nebulizer solution  0.5 mg Nebulization BID   chlorhexidine  15 mL Mouth Rinse BID   chlorhexidine gluconate (MEDLINE KIT)  15 mL Mouth Rinse BID   Chlorhexidine Gluconate Cloth  6 each Topical Daily   docusate  100 mg Per Tube Daily   feeding supplement (PRO-STAT SUGAR FREE 64)  30 mL Per Tube Daily   folic acid  0.5 mg Oral Daily   heparin  5,000 Units Subcutaneous Q8H   hydrALAZINE  50 mg Per Tube Q8H   insulin aspart  0-15 Units Subcutaneous Q4H   ipratropium-albuterol  3 mL Nebulization TID   iron polysaccharides  150 mg Oral BID   mouth rinse  15 mL Mouth Rinse 10 times per day   montelukast  10 mg Per Tube QHS   multivitamin  1 tablet Oral Daily   nitroGLYCERIN  1 inch Topical Q8H   omega-3 acid ethyl esters  1 g Oral Daily   pantoprazole sodium  40 mg Per Tube Q1200   pyridOXINE  100 mg Per Tube Daily   senna  1 tablet Per Tube BID   simvastatin  20 mg Per Tube Daily   traZODone  50 mg Per Tube QHS   vitamin B-12  1,000 mcg Per Tube Daily   vitamin E  400 Units Per Tube Daily   Continuous Infusions:  dexmedetomidine (PRECEDEX) IV infusion Stopped (09/11/18 2358)   feeding supplement (VITAL HIGH PROTEIN) 55 mL/hr at 09/12/18 1440   fentaNYL infusion INTRAVENOUS 100 mcg/hr (09/13/18 0400)   PRN Meds: acetaminophen **OR** acetaminophen, alum & mag hydroxide-simeth, cyclobenzaprine, hydrALAZINE, oxyCODONE-acetaminophen   Vital Signs    Vitals:   09/13/18 0400 09/13/18 0446 09/13/18 0447 09/13/18 0500  BP: (!) 130/47 (!) 130/47  (!) 126/54  Pulse: 72   70  Resp: 16   16  Temp: 99.7 F (37.6 C)   99.5 F  (37.5 C)  TempSrc:      SpO2: (!) 89%   97%  Weight:   81.5 kg   Height:        Intake/Output Summary (Last 24 hours) at 09/13/2018 0736 Last data filed at 09/13/2018 0400 Gross per 24 hour  Intake 1490.93 ml  Output 2200 ml  Net -709.07 ml   Filed Weights   09/11/18 0500 09/12/18 0500 09/13/18 0447  Weight: 84.7 kg 82.8 kg 81.5 kg    Physical Exam   General: Intubated, NAD Lungs:Clear to ausculation bilaterally. No wheezes, rales, or rhonchi. Intubated  Cardiovascular: RRR with S1 S2. + murmur Abdomen: Soft, non-tender, non-distended. No obvious abdominal masses. Extremities: Mild 1+ BLE edema. No clubbing or cyanosis. DP pulses 1+ bilaterally Neuro: Alert, intubated. No focal deficits. No facial asymmetry. MAE spontaneously. Psych: Responds nonverbally to questions appropriately with normal affect.    Labs    Chemistry Recent Labs  Lab 09/11/18 0230 09/12/18 0205 09/13/18 0220  NA 140 142 143  K 5.5* 5.2* 4.1  CL 98 101 100  CO2 32 31 33*  GLUCOSE 217* 100* 191*  BUN 119* 122* 110*  CREATININE 2.57* 2.94* 2.86*  CALCIUM 9.2 8.7* 8.6*  PROT  --   --  6.5  ALBUMIN  --   --  2.8*  AST  --   --  13*  ALT  --   --  13  ALKPHOS  --   --  45  BILITOT  --   --  0.5  GFRNONAA 17* 15* 15*  GFRAA 20* 17* 18*  ANIONGAP '10 10 10     ' Hematology Recent Labs  Lab 09/11/18 0230 09/12/18 0205 09/13/18 0220  WBC 7.6 7.5 10.1  RBC 3.19* 2.87* 2.86*  HGB 9.1* 8.2* 8.1*  HCT 31.6* 27.2* 27.2*  MCV 99.1 94.8 95.1  MCH 28.5 28.6 28.3  MCHC 28.8* 30.1 29.8*  RDW 17.3* 18.5* 18.5*  PLT 228 240 215    Cardiac EnzymesNo results for input(s): TROPONINI in the last 168 hours. No results for input(s): TROPIPOC in the last 168 hours.   BNPNo results for input(s): BNP, PROBNP in the last 168 hours.   DDimer No results for input(s): DDIMER in the last 168 hours.   Radiology    Dg Chest 1 View  Result Date: 09/11/2018 CLINICAL DATA:  New left central line EXAM: CHEST  1  VIEW COMPARISON:  09/11/2018, 11:57 a.m. FINDINGS: Interval placement of a left subclavian vascular catheter, tip projecting over the right atrium. Otherwise unchanged examination with endotracheal tube just above the carina, esophagogastric tube tip and side port below the diaphragm, cardiomegaly, pulmonary vascular prominence, retrocardiac opacity, and a possible small left pleural effusion. IMPRESSION: Interval placement of a left subclavian vascular catheter, tip projecting over the right atrium. Otherwise unchanged AP portable examination. Electronically Signed   By: Eddie Candle M.D.   On: 09/11/2018 14:03   Dg Abd 1 View  Result Date: 09/11/2018 CLINICAL DATA:  ETT, OG placement EXAM: PORTABLE CHEST 1 VIEW COMPARISON:  09/10/2018 FINDINGS: Interval placement of endotracheal tube, tip projecting above the carina. Otherwise unchanged AP portable examination of the chest with cardiomegaly, pulmonary vascular prominence, retrocardiac opacity reflecting atelectasis or consolidation, and a possible small left pleural effusion. Interval placement of orogastric tube with tip and side port below the diaphragm. Unremarkable pattern of partially imaged bowel gas. IMPRESSION: 1. Interval placement of endotracheal tube, tip projecting above the carina. 2. Otherwise unchanged AP portable examination of the chest with cardiomegaly, pulmonary vascular prominence, retrocardiac opacity reflecting atelectasis or consolidation, and a possible small left pleural effusion. 3. Interval placement of orogastric tube with tip and side port below the diaphragm. Electronically Signed   By: Eddie Candle M.D.   On: 09/11/2018 12:14   Dg Chest Port 1 View  Result Date: 09/13/2018 CLINICAL DATA:  ET position EXAM: PORTABLE CHEST 1 VIEW COMPARISON:  Radiograph 09/12/2018 FINDINGS: Patient is rotated in a steep left anterior oblique superimposing the mediastinal structures over the right chest and narrowing the left lung window.  Interval retraction of the endotracheal tube now positioned approximately 2 cm from the carina. Transesophageal tube tip and side port appear distal to the GE junction, below the level of imaging. A left subclavian line terminates at the superior cavoatrial junction. Persistent bilateral airspace opacities with hazy bibasilar atelectatic change with obscuration of the hemidiaphragms possibly reflecting a small amount of layering effusion. IMPRESSION: 1. Patient is rotated in a steep left anterior oblique superimposing the mediastinal structures over the right chest and narrowing of the left lung window. 2. Interval retraction of the endotracheal tube now positioned approximately 2 cm from the carina. Could be retracted additional 1-2 cm to position in the mid trachea. These results  will be called to the ordering clinician or representative by the Radiologist Assistant, and communication documented in the PACS or zVision Dashboard. Electronically Signed   By: Lovena Le M.D.   On: 09/13/2018 06:47   Dg Chest Port 1 View  Result Date: 09/12/2018 CLINICAL DATA:  Respiratory failure. EXAM: PORTABLE CHEST 1 VIEW COMPARISON:  Radiograph of September 11, 2018. FINDINGS: Stable cardiomegaly. Endotracheal tube is slightly directed toward right mainstem bronchus; withdrawal by 2-3 cm is recommended. Nasogastric tube is unchanged in position. Left subclavian catheter is unchanged. No definite pneumothorax is noted. Stable bibasilar atelectasis or infiltrates are noted. Possible small left pleural effusion is noted. Bony thorax is unremarkable. IMPRESSION: Endotracheal tube is slightly directed toward right mainstem bronchus; withdrawal by 2-3 cm is recommended. Mild bibasilar opacities are noted concerning for atelectasis or infiltrates, with possible small left pleural effusion. These results will be called to the ordering clinician or representative by the Radiologist Assistant, and communication documented in the PACS  or zVision Dashboard. Electronically Signed   By: Marijo Conception M.D.   On: 09/12/2018 08:24   Dg Chest Port 1 View  Result Date: 09/11/2018 CLINICAL DATA:  ETT, OG placement EXAM: PORTABLE CHEST 1 VIEW COMPARISON:  09/10/2018 FINDINGS: Interval placement of endotracheal tube, tip projecting above the carina. Otherwise unchanged AP portable examination of the chest with cardiomegaly, pulmonary vascular prominence, retrocardiac opacity reflecting atelectasis or consolidation, and a possible small left pleural effusion. Interval placement of orogastric tube with tip and side port below the diaphragm. Unremarkable pattern of partially imaged bowel gas. IMPRESSION: 1. Interval placement of endotracheal tube, tip projecting above the carina. 2. Otherwise unchanged AP portable examination of the chest with cardiomegaly, pulmonary vascular prominence, retrocardiac opacity reflecting atelectasis or consolidation, and a possible small left pleural effusion. 3. Interval placement of orogastric tube with tip and side port below the diaphragm. Electronically Signed   By: Eddie Candle M.D.   On: 09/11/2018 12:14   Telemetry    09/13/2018 NSR HR 70's- Personally Reviewed  ECG    No new tracing as of 09/13/2018- Personally Reviewed  Cardiac Studies   Echocardiogram 09/11/2018:  1. The left ventricle has hyperdynamic systolic function, with an ejection fraction of >65%. The cavity size was normal. There is moderate concentric left ventricular hypertrophy. Left ventricular diastolic Doppler parameters are consistent with  pseudonormalization. Elevated mean left atrial pressure No evidence of left ventricular regional wall motion abnormalities. 2. The right ventricle has normal systolic function. The cavity was mildly enlarged. There is mildly increased right ventricular wall thickness. Right ventricular systolic pressure is moderately elevated. 3. Left atrial size was moderately dilated. 4. Right atrial size  was mildly dilated. 5. Small pericardial effusion. 6. The pericardial effusion is circumferential. 7. The MR jet is centrally-directed. 8. The aortic valve is tricuspid. Mild thickening of the aortic valve. Moderate calcification of the aortic valve. Mild-moderate stenosis of the aortic valve. 9. The aorta is not well visualized unless otherwise noted. 10. The inferior vena cava was dilated in size with <50% respiratory variability.  Patient Profile     78 y.o. female with acute on chronic respiratory failure/hypoxia, longstanding severe COPD, chronic pulmonary artery hypertension, acute on chronic diastolic heart failure moderate aortic stenosis, mitral insufficiency with varying severity, CKD stage IV, DM  Assessment & Plan    1.  Acute hypoxic respiratory failure in the setting of COPD exacerbation: -More likely COPD exacerbation with respiratory failure versus diastolic HF>>intubated per PCCM 09/11/2018>>weaning this  AM   -Echocardiogram with LVEF of greater than 65% with no evidence of left ventricular regional wall motion abnormalities. Right ventricular systolic pressure is moderately elevated. There is evidence of pericardial effusion which was noted to be circumferential.  MR jet centrally directed and there is mild to moderate stenosis of the aortic valve. -Currently intubated>>following commands, alert  -Follow therapy for COPD exacerbation and respiratory failure -Continue diuresis today with IV lasix 40 mg BID, will watch renal function   2.  Aortic stenosis, mitral regurgitation: -On previous echocardiogram gradients exaggerated by markedly hyperdynamic LVEF, moderate MR on last TTE -Echocardiogram 09/11/2018 with MR which was noted to have a centrally directed jet, aortic stenosis documented as mild to moderate -Monitor for now with serial echocardiograms, no need for further intervention at this time  3. Chronic diastolic HF: -Echocardiogram from 09/11/2018 with an  ejection fraction of >65%. There was pericardial effusion is circumferential. MR jet is centrally-directed. Aortic valve with mild-moderate stenosis of the aortic valve. -Weight, 176lb today with admission weight of 187lb  -I&O, net negative 8L -Would hold Lasix again today as above    4.  COPD: -On BiPAP 09/11/2018>>intubated for respiratory failure after failed Bipap therapy  -Continue Pulmicort, albuterol, DuoNeb>>Solu-Medrol on hold -No longer on vasopressors  -CCM managing   4.  CKD stage IV: -Creatinine, 2.30, 2.49, 2.57> 2.94>2.86 today -Monitor creatinine closely with daily labs  5.  DM2: -Per primary team -SSI for glucose control while inpatient status   Signed, Kathyrn Drown NP-C [facility] Pager: [phone] 09/13/2018, 7:36 AM     For questions or updates, please contact   Please consult [email or web address] for contact info under Cardiology/STEMI.  Patient seen and examined.  Agree with above documentation.  On exam, she is alert today and following commands, mechanical breath sounds, RRR, 2/6 systolic murmur loudest at RUSB, no LE edema.  Telemetry personally reviewed and shows sinus rhythm in 70s.  Lasix 40 mg BID yesterday, was net negative 700cc.  Net negative 8L on admission.  Renal function improved today, 2.94 -> 2.86.  Can continue gentle diuresis today with IV lasix 40 mg BID and will continue to watch renal function.  Donato Heinz, MD

## 2018-09-13 NOTE — Progress Notes (Addendum)
NAME:  [name], MRN:  [id number], DOB:  January 14, 1940, LOS: 12 ADMISSION DATE:  09/03/2018, CONSULTATION DATE:  09/11/2018 REFERRING MD:  [facility]-Dr. Louanne Belton, CHIEF COMPLAINT:  Acute Respiratory Failure   Brief History   78 yo African [place] female who presented with acute COPD exacerbation subsequently resulting in acute on chronic hypoxic respiratory failure requiring mechanincal ventilation 9/1.   Past Medical History  CHF COPD Pulmonary HTN CKD stage 4 Type 2 diabetes  Essential hypertension  Hyperlipidemia  Significant Hospital Events   8/24 Admit  9/01 Intubated for respiratory distress  Consults:  PCCM  Procedures:  ETT 9/1 >>  L TLC 9/1 >>   Significant Diagnostic Tests:  ECHO 9/1 >> LVEF >65%, RV with normal systolic function, cavity mildly enlarged, RA mildly dilated, small circumferential pericardial effusion  Micro Data:  COVID 8/24 >> negative MRSA 8/24 >>positive COVID 8/31 >> negative  Antimicrobials:    Interim history/subjective:  RN reports no events overnight. RT reported desaturation of 88% on PSV early this morning, re-attempt at PSV currently underway. 2.2L urine output in 24/hr, net neg 8L since admission.   Objective   Blood pressure (S) (!) 165/53, pulse 71, temperature 99.5 F (37.5 C), resp. rate 18, height 5\' 5"  (1.651 m), weight 81.5 kg, SpO2 91 %.    Vent Mode: PRVC FiO2 (%):  [40 %-45 %] 45 % Set Rate:  [16 bmp] 16 bmp Vt Set:  [450 mL] 450 mL PEEP:  [5 cmH20] 5 cmH20 Pressure Support:  [5 cmH20-10 cmH20] 10 cmH20 Plateau Pressure:  [10 cmH20-19 cmH20] 16 cmH20   Intake/Output Summary (Last 24 hours) at 09/13/2018 0953 Last data filed at 09/13/2018 0400 Gross per 24 hour  Intake 1355.85 ml  Output 2200 ml  Net -844.15 ml   Filed Weights   09/11/18 0500 09/12/18 0500 09/13/18 0447  Weight: 84.7 kg 82.8 kg 81.5 kg    Examination: General: Pleasant elderly female, lying in bed on vent in NAD, able to follow commands  HEENT:  ETT/OG tube in place, pink moist mucus membranes  Neuro: Ake and alert on vent, able to follow commands, interactive CV: [id number], holosystolic murmur, regular rate and rhythm PULM: PRVC ventilation, anterior lungs clear to auscultation bilaterally. Diminished bases  GI: obese, soft, active bowel sounds  Extremities: BLE with evidence of chronic venous stasis, warm/dry, trace LE edema,  bilateral feet wrapped in gauze  Skin: no rashes or lesions  Resolved Hospital Problem list     Assessment & Plan:  78 year old female with extensive PMH who presents to PCCM with respiratory failure that is likely multi-factorial with AMS and inability to protect her airway requiring initiation of mechanical ventilation.  Acute Hypoxemic Respiratory Failure - in setting of acute COPD, pulmonary edema, pulmonary HTN. Ruled out infectious etiology  P: PRVC 8cc/kg Extubate if able to pass SBT Wean PEEP / FiO2 for sats >88% Obtain and follow intermittent CXR  Per cardiology hold diuretics again today   Pulmonary HTN -PASP on ECHO 60, no RHC on file  P: Continue supportive [name] Goal for sats >88% Doubt she would be a candidate for vasodilators   Aortic stenosis, mitral regurgitation  -MR noted with central directed jet and mild to moderated aortic stenosis seen on ECHO 9/1. Cardiology following P: No further interventions indicated per cardiology Follow with serial [facility]  COPD P: Continue inhaled steroids and bronchodilators  Continue duoneb, albuterol  Hold steroids given no evidence of acute exacerbation   AKI with  history of CKD stage 4 Baseline creatine between 2.3-2.5 P: Trend BMP / urinary output Replace electrolytes as indicated Ensure adequate renal perfusion, avoid nephrotoxins  Acute Metabolic Encephalopathy  P: Precedex discontinued  Wean fentanyl drip  No focal neuro deficits, follow exam   At Risk Malnutrition  P: ContinueTF per protocol and nutrition  Add  supplementations once extubated    GOC: Niece updated 9/1 per Dr. Nelda Marseille > further discussion with limited code status.   Best practice:  Diet: TF per OG tube  Pain/Anxiety/Delirium protocol (if indicated): Fentanyl drip VAP protocol (if indicated): in place DVT prophylaxis: SQ heparin GI prophylaxis: PPI Glucose control: SSI Mobility: BR Code Status: LCB Family Communication: Family will be updated at either beside or over the phone  Disposition: Remain in ICU   Labs   CBC: Recent Labs  Lab 09/09/18 0511 09/10/18 0515 09/11/18 0230 09/12/18 0205 09/13/18 0220  WBC 6.7 7.4 7.6 7.5 10.1  HGB 8.6* 9.2* 9.1* 8.2* 8.1*  HCT 29.1* 30.3* 31.6* 27.2* 27.2*  MCV 97.3 95.6 99.1 94.8 95.1  PLT 220 228 228 240 408    Basic Metabolic Panel: Recent Labs  Lab 09/09/18 0511 09/10/18 0515 09/11/18 0230 09/12/18 0205 09/12/18 1213 09/12/18 1745 09/13/18 0220  NA 140 138 140 142  --   --  143  K 4.7 5.3* 5.5* 5.2*  --   --  4.1  CL 99 97* 98 101  --   --  100  CO2 33* 32 32 31  --   --  33*  GLUCOSE 161* 149* 217* 100*  --   --  191*  BUN 100* 108* 119* 122*  --   --  110*  CREATININE 2.30* 2.49* 2.57* 2.94*  --   --  2.86*  CALCIUM 9.0 8.9 9.2 8.7*  --   --  8.6*  MG  --   --   --  2.5* 2.3 2.3 2.3  PHOS  --   --   --  3.2 3.5 4.2 4.2   GFR: Estimated Creatinine Clearance: 17.6 mL/min (A) (by C-G formula based on SCr of 2.86 mg/dL (H)). Recent Labs  Lab 09/10/18 0515 09/11/18 0230 09/12/18 0205 09/13/18 0220  WBC 7.4 7.6 7.5 10.1    Liver Function Tests: Recent Labs  Lab 09/13/18 0220  AST 13*  ALT 13  ALKPHOS 45  BILITOT 0.5  PROT 6.5  ALBUMIN 2.8*   No results for input(s): LIPASE, AMYLASE in the last 168 hours. No results for input(s): AMMONIA in the last 168 hours.  ABG    Component Value Date/Time   PHART 7.446 09/13/2018 0345   PCO2ART 51.7 (H) 09/13/2018 0345   PO2ART 58.1 (L) 09/13/2018 0345   HCO3 34.9 (H) 09/13/2018 0345   TCO2 22  09/03/2014 0002   ACIDBASEDEF 1.3 07/27/2018 0421   O2SAT 88.4 09/13/2018 0345     Coagulation Profile: No results for input(s): INR, PROTIME in the last 168 hours.  Cardiac Enzymes: No results for input(s): CKTOTAL, CKMB, CKMBINDEX, TROPONINI in the last 168 hours.  HbA1C: Hgb A1c MFr Bld  Date/Time Value Ref Range Status  06/16/2018 08:15 PM 9.9 (H) 4.8 - 5.6 % Final    Comment:    (NOTE)         Prediabetes: 5.7 - 6.4         Diabetes: >6.4         Glycemic control for adults with diabetes: <7.0   04/04/2017 02:04 AM 5.8 (H)  4.8 - 5.6 % Final    Comment:    (NOTE)         Prediabetes: 5.7 - 6.4         Diabetes: >6.4         Glycemic control for adults with diabetes: <7.0     CBG: Recent Labs  Lab 09/12/18 1956 09/12/18 2156 09/12/18 2356 09/13/18 0331 09/13/18 0731  GLUCAP 165* 184* 174* 169* 124*     Critical [name] time:    CRITICAL [name] Performed by: Johnsie Cancel   Total critical [name] time: 35 minutes  Critical [name] time was exclusive of separately billable procedures and treating other patients.  Critical [name] was necessary to treat or prevent imminent or life-threatening deterioration.  Critical [name] was time spent personally by me on the following activities: development of treatment plan with patient and/or surrogate as well as nursing, discussions with consultants, evaluation of patient's response to treatment, examination of patient, obtaining history from patient or surrogate, ordering and performing treatments and interventions, ordering and review of laboratory studies, ordering and review of radiographic studies, pulse oximetry and re-evaluation of patient's condition.   Johnsie Cancel, NP-C [name] Pgr: [phone] or if no answer [phone] 09/13/18  Attending Note:  78 year old female with PMH of CKD, COPD and pulmonary HTN who presents to PCCM with respiratory failure, pulmonary edema and COPD.  Diuresed  relatively ok overnight but continues to have pulmonary edema on CXR that I reviewed myself.  On exam, diffuse crackles.  Begin PS trials.  Continue active diureses today.  Hold off extubation today.  PCCM will continue to follow.  The patient is critically ill with multiple organ systems failure and requires high complexity decision making for assessment and support, frequent evaluation and titration of therapies, application of advanced monitoring technologies and extensive interpretation of multiple databases.   Critical [name] Time devoted to patient [name] services described in this note is  33  Minutes. This time reflects time of [name] of this signee Dr [name]. This critical [name] time does not reflect procedure time, or teaching time or supervisory time of PA/NP/Med student/Med Resident etc but could involve [name] discussion time.  Rush Farmer, M.D. [facility]Critical [name] [facility]. Pager: [phone]. After hours pager: [phone].

## 2018-09-14 LAB — CBC
HCT: 27.3 % — ABNORMAL LOW (ref 36.0–46.0)
Hemoglobin: 8.4 g/dL — ABNORMAL LOW (ref 12.0–15.0)
MCH: 29.7 pg (ref 26.0–34.0)
MCHC: 30.8 g/dL (ref 30.0–36.0)
MCV: 96.5 fL (ref 80.0–100.0)
Platelets: 197 10*3/uL (ref 150–400)
RBC: 2.83 MIL/uL — ABNORMAL LOW (ref 3.87–5.11)
RDW: 18.3 % — ABNORMAL HIGH (ref 11.5–15.5)
WBC: 13.6 10*3/uL — ABNORMAL HIGH (ref 4.0–10.5)
nRBC: 0 % (ref 0.0–0.2)

## 2018-09-14 LAB — COMPREHENSIVE METABOLIC PANEL
ALT: 13 U/L (ref 0–44)
AST: 10 U/L — ABNORMAL LOW (ref 15–41)
Albumin: 2.8 g/dL — ABNORMAL LOW (ref 3.5–5.0)
Alkaline Phosphatase: 46 U/L (ref 38–126)
Anion gap: 9 (ref 5–15)
BUN: 147 mg/dL — ABNORMAL HIGH (ref 8–23)
CO2: 35 mmol/L — ABNORMAL HIGH (ref 22–32)
Calcium: 8.5 mg/dL — ABNORMAL LOW (ref 8.9–10.3)
Chloride: 99 mmol/L (ref 98–111)
Creatinine, Ser: 2.83 mg/dL — ABNORMAL HIGH (ref 0.44–1.00)
GFR calc Af Amer: 18 mL/min — ABNORMAL LOW (ref >60)
GFR calc non Af Amer: 16 mL/min — ABNORMAL LOW (ref >60)
Glucose, Bld: 209 mg/dL — ABNORMAL HIGH (ref 70–99)
Potassium: 4 mmol/L (ref 3.5–5.1)
Sodium: 143 mmol/L (ref 135–145)
Total Bilirubin: 0.4 mg/dL (ref 0.3–1.2)
Total Protein: 6.8 g/dL (ref 6.5–8.1)

## 2018-09-14 LAB — GLUCOSE, CAPILLARY
Glucose-Capillary: 156 mg/dL — ABNORMAL HIGH (ref 70–99)
Glucose-Capillary: 162 mg/dL — ABNORMAL HIGH (ref 70–99)
Glucose-Capillary: 168 mg/dL — ABNORMAL HIGH (ref 70–99)
Glucose-Capillary: 199 mg/dL — ABNORMAL HIGH (ref 70–99)
Glucose-Capillary: 202 mg/dL — ABNORMAL HIGH (ref 70–99)
Glucose-Capillary: 211 mg/dL — ABNORMAL HIGH (ref 70–99)

## 2018-09-14 MED ORDER — CHLORHEXIDINE GLUCONATE 0.12% ORAL RINSE (MEDLINE KIT)
15.0000 mL | Freq: Two times a day (BID) | OROMUCOSAL | Status: DC
  Administered 2018-09-14 – 2018-09-21 (×14): 15 mL via OROMUCOSAL

## 2018-09-14 MED ORDER — SODIUM CHLORIDE 0.9% FLUSH: 10.0000 mL | INTRAVENOUS | Status: DC | PRN

## 2018-09-14 MED ORDER — ORAL CARE MOUTH RINSE
15.0000 mL | OROMUCOSAL | Status: DC
  Administered 2018-09-14 (×8): 15 mL via OROMUCOSAL

## 2018-09-14 MED ORDER — FUROSEMIDE 10 MG/ML IJ SOLN
40.0000 mg | Freq: Two times a day (BID) | INTRAMUSCULAR | Status: DC
  Administered 2018-09-14 – 2018-09-17 (×7): 40 mg via INTRAVENOUS
  Filled 2018-09-14 (×7): qty 4

## 2018-09-14 MED ORDER — SODIUM CHLORIDE 0.9% FLUSH
10.0000 mL | Freq: Two times a day (BID) | INTRAVENOUS | Status: DC
  Administered 2018-09-14: 10 mL
  Administered 2018-09-14: 40 mL
  Administered 2018-09-15 – 2018-09-16 (×3): 10 mL

## 2018-09-14 NOTE — Procedures (Signed)
Extubation Procedure Note  Patient Details:   Name: [name] [facility] DOB: 1940/01/28 MRN: [id number]   Airway Documentation:  Airway 7.5 mm (Active)  Secured at (cm) 24 cm 09/14/18 0838  Measured From Lips 09/14/18 0838  Secured Location Right 09/14/18 0838  Secured By [employer] 09/14/18 0838  Tube Holder Repositioned Yes 09/14/18 0838  Cuff Pressure (cm H2O) 30 cm H2O 09/13/18 2113  Site Condition Dry 09/14/18 0838   Vent end date: 09/14/18 Vent end time: 1110(Pt placed on 5 lpm [place])   Evaluation  O2 sats: stable throughout Complications: No apparent complications Patient did tolerate procedure well. Bilateral Breath Sounds: Diminished  Pt placed on 5 lpm  Yes  [name] 09/14/2018, 11:33 AM

## 2018-09-14 NOTE — Progress Notes (Addendum)
Progress Note  Patient Name: [name] [facility] Date of Encounter: 09/14/2018  Primary Cardiologist: Buford Dresser, MD  Subjective   Remains intubated today. Following commands. Denies pain. Running low grade fever today with leukocytosis   Inpatient Medications    Scheduled Meds: . amLODipine  10 mg Per Tube Daily  . budesonide (PULMICORT) nebulizer solution  0.5 mg Nebulization BID  . chlorhexidine gluconate (MEDLINE KIT)  15 mL Mouth Rinse BID  . Chlorhexidine Gluconate Cloth  6 each Topical Daily  . docusate  100 mg Per Tube Daily  . feeding supplement (PRO-STAT SUGAR FREE 64)  30 mL Per Tube Daily  . folic acid  0.5 mg Oral Daily  . heparin  5,000 Units Subcutaneous Q8H  . hydrALAZINE  50 mg Per Tube Q8H  . insulin aspart  0-15 Units Subcutaneous Q4H  . ipratropium-albuterol  3 mL Nebulization TID  . iron polysaccharides  150 mg Oral BID  . mouth rinse  15 mL Mouth Rinse 10 times per day  . montelukast  10 mg Per Tube QHS  . multivitamin  1 tablet Oral Daily  . nitroGLYCERIN  1 inch Topical Q8H  . omega-3 acid ethyl esters  1 g Oral Daily  . pantoprazole sodium  40 mg Per Tube Q1200  . pyridOXINE  100 mg Per Tube Daily  . senna  1 tablet Per Tube BID  . simvastatin  20 mg Per Tube Daily  . sodium chloride flush  10-40 mL Intracatheter Q12H  . traZODone  50 mg Per Tube QHS  . vitamin B-12  1,000 mcg Per Tube Daily  . vitamin E  400 Units Per Tube Daily   Continuous Infusions: . dexmedetomidine (PRECEDEX) IV infusion Stopped (09/11/18 2358)  . feeding supplement (VITAL HIGH PROTEIN) 55 mL/hr at 09/13/18 1900  . fentaNYL infusion INTRAVENOUS 50 mcg/hr (09/14/18 0615)   PRN Meds: acetaminophen **OR** acetaminophen, alum & mag hydroxide-simeth, cyclobenzaprine, hydrALAZINE, oxyCODONE-acetaminophen, sodium chloride flush   Vital Signs    Vitals:   09/14/18 0400 09/14/18 0500 09/14/18 0600 09/14/18 0700  BP: (!) 127/52 (!) 148/56 (!) 135/55 (!) 131/50   Pulse: 69 86 80 82  Resp: _0 Temp: 99.7 F (37.6 C) 99.7 F (37.6 C) 99.1 F (37.3 C) 99.3 F (37.4 C)  TempSrc: Core     SpO2: 96% 94% 97% 96%  Weight:  84.8 kg    Height:        Intake/Output Summary (Last 24 hours) at 09/14/2018 0820 Last data filed at 09/14/2018 0600 Gross per 24 hour  Intake 1157.86 ml  Output 1550 ml  Net -392.14 ml   Filed Weights   09/12/18 0500 09/13/18 0447 09/14/18 0500  Weight: 82.8 kg 81.5 kg 84.8 kg    Physical Exam   General: Well developed, well nourished, NAD Lungs: Diminished in bilateral lobes. Breathing is unlabored. Cardiovascular: RRR with S1 S2. No murmur Abdomen: Soft, non-tender, non-distended. No obvious abdominal masses. Extremities: No edema. No clubbing or cyanosis. DP pulses 2+ bilaterally Neuro: Intubated. No focal deficits. No facial asymmetry. MAE spontaneously. Psych: Intubated  Labs    Chemistry Recent Labs  Lab 09/12/18 0205 09/13/18 0220 09/14/18 0418  NA 142 143 143  K 5.2* 4.1 4.0  CL 101 100 99  CO2 31 33* 35*  GLUCOSE 100* 191* 209*  BUN 122* 110* 147*  CREATININE 2.94* 2.86* 2.83*  CALCIUM 8.7* 8.6* 8.5*  PROT  --  6.5 6.8  ALBUMIN  --  2.8* 2.8*  AST  --  13* 10*  ALT  --  13 13  ALKPHOS  --  45 46  BILITOT  --  0.5 0.4  GFRNONAA 15* 15* 16*  GFRAA 17* 18* 18*  ANIONGAP _0 Hematology Recent Labs  Lab 09/12/18 0205 09/13/18 0220 09/14/18 0418  WBC 7.5 10.1 13.6*  RBC 2.87* 2.86* 2.83*  HGB 8.2* 8.1* 8.4*  HCT 27.2* 27.2* 27.3*  MCV 94.8 95.1 96.5  MCH 28.6 28.3 29.7  MCHC 30.1 29.8* 30.8  RDW 18.5* 18.5* 18.3*  PLT 240 215 197    Cardiac EnzymesNo results for input(s): TROPONINI in the last 168 hours. No results for input(s): TROPIPOC in the last 168 hours.   BNPNo results for input(s): BNP, PROBNP in the last 168 hours.   DDimer No results for input(s): DDIMER in the last 168 hours.   Radiology    Dg Chest Port 1 View  Result Date: 09/13/2018 CLINICAL  DATA:  ET position EXAM: PORTABLE CHEST 1 VIEW COMPARISON:  Radiograph 09/12/2018 FINDINGS: Patient is rotated in a steep left anterior oblique superimposing the mediastinal structures over the right chest and narrowing the left lung window. Interval retraction of the endotracheal tube now positioned approximately 2 cm from the carina. Transesophageal tube tip and side port appear distal to the GE junction, below the level of imaging. A left subclavian line terminates at the superior cavoatrial junction. Persistent bilateral airspace opacities with hazy bibasilar atelectatic change with obscuration of the hemidiaphragms possibly reflecting a small amount of layering effusion. IMPRESSION: 1. Patient is rotated in a steep left anterior oblique superimposing the mediastinal structures over the right chest and narrowing of the left lung window. 2. Interval retraction of the endotracheal tube now positioned approximately 2 cm from the carina. Could be retracted additional 1-2 cm to position in the mid trachea. These results will be called to the ordering clinician or representative by the Radiologist Assistant, and communication documented in the PACS or zVision Dashboard. Electronically Signed   By: Lovena Le M.D.   On: 09/13/2018 06:47   Telemetry    09/14/2018 NSR HR 80-90's  - Personally Reviewed  ECG    No new tracing as of 09/14/2018- Personally Reviewed  Cardiac Studies   Echocardiogram 09/11/2018:  1. The left ventricle has hyperdynamic systolic function, with an ejection fraction of >65%. The cavity size was normal. There is moderate concentric left ventricular hypertrophy. Left ventricular diastolic Doppler parameters are consistent with  pseudonormalization. Elevated mean left atrial pressure No evidence of left ventricular regional wall motion abnormalities. 2. The right ventricle has normal systolic function. The cavity was mildly enlarged. There is mildly increased right ventricular wall  thickness. Right ventricular systolic pressure is moderately elevated. 3. Left atrial size was moderately dilated. 4. Right atrial size was mildly dilated. 5. Small pericardial effusion. 6. The pericardial effusion is circumferential. 7. The MR jet is centrally-directed. 8. The aortic valve is tricuspid. Mild thickening of the aortic valve. Moderate calcification of the aortic valve. Mild-moderate stenosis of the aortic valve. 9. The aorta is not well visualized unless otherwise noted. 10. The inferior vena cava was dilated in size with <50% respiratory variability.  Patient Profile     78 y.o. female  with acute on chronic respiratory failure/hypoxia, longstanding severe COPD, chronic pulmonary artery hypertension, acute on chronic diastolic heart failure moderate aortic stenosis, mitral insufficiency with varying severity, CKD stage IV, DM  Assessment & Plan  1. Acute hypoxic respiratory failure in the setting of COPD exacerbation: -More likely COPD exacerbationwithrespiratory failureversus diastolic HF>>intubated per PCCM 09/11/2018>>weaning this AM   -Echocardiogram with LVEF of greater than 65% with no evidence of left ventricular regional wall motion abnormalities. Right ventricular systolic pressure is moderately elevated. There is evidence of pericardial effusion which was noted to be circumferential. MR jet centrally directed and there is mild to moderate stenosis of the aortic valve. -Remains intubated>>following commands, alert  -Follow therapy for COPD exacerbation and respiratory failure -Continue diuresis today with IV lasix 40 mg BID and monitor renal function  2. Aortic stenosis, mitral regurgitation: -On previous echocardiogram gradients exaggerated by markedly hyperdynamic LVEF, moderate MR on last TTE -Echocardiogram 09/11/2018 with MR which was noted to have a centrally directed jet, aortic stenosis documented as mild to moderate -Monitor for now with serial  echocardiograms, no need for further intervention at this time  3.Chronic diastolic HF: -Echocardiogram from 09/01/2020with an ejection fraction of >65%.There waspericardial effusion is circumferential. MR jet is centrally-directed. Aortic valvewith mild-moderate stenosis of the aortic valve. -Weight, 186lb today with admission weight of 187lb ??accuracy given a 7lb weight gain in one day  -I&O, net negative 8.3L -Continue gentle diuresis with IV Lasix 26m BID>>creatinine stable but elevated    4. COPD: -On BiPAP 09/11/2018>>intubated for respiratory failure after failed Bipap therapy -Continue Pulmicort, albuterol, DuoNeb>>Solu-Medrol on hold -No longer on vasopressors -CCMmanaging  4. CKD stage IV: -Creatinine, 2.30, 2.49, 2.57>2.94>2.86>2.83 today -Monitor creatinine closely with daily labs  5. DM2: -Per primary team -SSI for glucose control while inpatient status  6. Leukocytosis: -Running low grade temp at 99.9 -WBC increased from 10.1>>>13.6 today    Signed, JKathyrn DrownNP-C HLewistownPager: [phone]/04/2018, 8:20 AM     For questions or updates, please contact   Please consult [email or web address] for contact info under Cardiology/STEMI.  Patient seen and examined.  Agree with above documentation.  Extubated today.  On exam, she is somnolent but arousable, follows commands, 2/6 systolic murmur, diffuse rhonchi.  Telemetry personally reviewed, shows sinus rhythm with rate 70-100s.  Net negative 390 cc yesterday.  On lasix 40 mg IV BID.  Renal function stable.  Can continue cautious diuresis but would watch renal function closely.

## 2018-09-14 NOTE — Progress Notes (Signed)
PT Cancellation Note  Patient Details Name: [name] MRN: [id number] DOB: 01/18/1940   Cancelled Treatment:     Pt continues on ventilator.  Will dc from PT at this time.  Please re-order when pt more able to participate with rehab.  Thank you.   Tyra Gural 09/14/2018, 7:07 AM

## 2018-09-14 NOTE — Progress Notes (Signed)
NAME:  [name], MRN:  [id number], DOB:  07-21-1940, LOS: 58 ADMISSION DATE:  09/03/2018, CONSULTATION DATE:  09/11/2018 REFERRING MD:  TRH-Dr. Louanne Belton, CHIEF COMPLAINT:  Acute Respiratory Failure   Brief History   [name] is a 78 yo [place] female who presented with acute COPD exacerbation subsequently resulting in acute on chronic hypoxic respiratory failure requiring mechanincal ventilation 9/1. Continued attempts to wean seen with hypoxia, will attempt SBT again today.   Past Medical History  CHF COPD Pulmonary HTN CKD stage 4 Type 2 diabetes  Essential hypertension  Hyperlipidemia  Significant Hospital Events   8/24 Admit  9/01 Intubated for respiratory distress  Consults:  PCCM  Procedures:  ETT 9/1 >>  L TLC 9/1 >>   Significant Diagnostic Tests:  ECHO 9/1 >> LVEF >65%, RV with normal systolic function, cavity mildly enlarged, RA mildly dilated, small circumferential pericardial effusion  Micro Data:  COVID 8/24 >> negative MRSA 8/24 >>positive COVID 8/31 >> negative  Antimicrobials:    Interim history/subjective:  No reported events overnight per RN. RT aware of plans to re-attempt PSV today with hopes of extubation. Tmax 99.9, 1.5L urine output, net neg 8.3L  Objective   Blood pressure (!) 131/50, pulse (!) 104, temperature 99.3 F (37.4 C), resp. rate 20, height 5\' 5"  (1.651 m), weight 84.8 kg, SpO2 97 %.    Vent Mode: PRVC FiO2 (%):  [40 %-45 %] 40 % Set Rate:  [16 bmp] 16 bmp Vt Set:  [450 mL] 450 mL PEEP:  [5 cmH20] 5 cmH20 Pressure Support:  [10 cmH20] 10 cmH20 Plateau Pressure:  [17 cmH20-21 cmH20] 21 cmH20   Intake/Output Summary (Last 24 hours) at 09/14/2018 1029 Last data filed at 09/14/2018 0600 Gross per 24 hour  Intake 1157.86 ml  Output 1550 ml  Net -392.14 ml   Filed Weights   09/12/18 0500 09/13/18 0447 09/14/18 0500  Weight: 82.8 kg 81.5 kg 84.8 kg    Examination: General: NAD today, pleasant elderly female, lying  in bed on vent, able to follow commands and is interactive   HEENT: Pink moist MM, ETT/OG tube in place  Neuro: Awake and alert on vent, following commands  CV: Holosystolic murmur, F7C9, regular rate and rhythm  PULM: Clear lungs sounds bilateral, diminished bases, PRVC ventilation GI: Soft obese , active bowel sounds with BM 9/3  Extremities: BLE with evidence of chronic venous stasis, warm/dry, trace LE edema,  bilateral feet wrapped in gauze  Skin: no rashes or lesions  Resolved Hospital Problem list   Acute metabolic Encephalopathy   -With correction of acidosis and decreased sedation  Assessment & Plan:  78 year old female with extensive PMH who presents to PCCM with respiratory failure that is likely multi-factorial with AMS and inability to protect her airway requiring initiation of mechanical ventilation.  Acute Hypoxemic Respiratory Failure - in setting of acute on chronic COPD, pulmonary edema, pulmonary HTN. Ruled out infectious etiology  P: Continue vent support with hopes of extubation today if able to pass SBT Wean PEEP/ FiO2 for sats > 88% Follow intermittent CXR Continue to diurese per cardiology   Pulmonary HTN -PASP on ECHO 60, no RHC on file  P: Goal of sats > 88% Continue supportive care   Aortic stenosis, mitral regurgitation  -MR noted with central directed jet and mild to moderated aortic stenosis seen on ECHO 9/1. Cardiology following P: Cardiology following, no further interventions indicated  Follow seral ECHO's  COPD P: Continue guideline  treatment including inhaled steroids and bronchodilators  Continue PRN Duonebs and albuterol  Hold systemic steroids given no signs of current excerebration   AKI with history of CKD stage 4 Baseline creatine between 2.3-2.5 P: Avoid nephrotoxins, ensure adequate renal perfusion  Follow BMP/ renal output  Follow and replace electrolytes as needed   At Risk Malnutrition  P: TF initiated and will be  continued  Add supplementation once taking oral nutrition   Best practice:  Diet: TF per OG tube  Pain/Anxiety/Delirium protocol (if indicated): Fentanyl drip VAP protocol (if indicated): in place DVT prophylaxis: SQ heparin GI prophylaxis: PPI Glucose control: SSI Mobility: BR Code Status: LCB Family Communication: Niece/family will be updated either by phone or in person today  Disposition: Remain in ICU   Labs   CBC: Recent Labs  Lab 09/10/18 0515 09/11/18 0230 09/12/18 0205 09/13/18 0220 09/14/18 0418  WBC 7.4 7.6 7.5 10.1 13.6*  HGB 9.2* 9.1* 8.2* 8.1* 8.4*  HCT 30.3* 31.6* 27.2* 27.2* 27.3*  MCV 95.6 99.1 94.8 95.1 96.5  PLT 228 228 240 215 314    Basic Metabolic Panel: Recent Labs  Lab 09/10/18 0515 09/11/18 0230 09/12/18 0205 09/12/18 1213 09/12/18 1745 09/13/18 0220 09/13/18 2100 09/14/18 0418  NA 138 140 142  --   --  143  --  143  K 5.3* 5.5* 5.2*  --   --  4.1  --  4.0  CL 97* 98 101  --   --  100  --  99  CO2 32 32 31  --   --  33*  --  35*  GLUCOSE 149* 217* 100*  --   --  191*  --  209*  BUN 108* 119* 122*  --   --  110*  --  147*  CREATININE 2.49* 2.57* 2.94*  --   --  2.86*  --  2.83*  CALCIUM 8.9 9.2 8.7*  --   --  8.6*  --  8.5*  MG  --   --  2.5* 2.3 2.3 2.3 2.4  --   PHOS  --   --  3.2 3.5 4.2 4.2 4.0  --    GFR: Estimated Creatinine Clearance: 18.2 mL/min (A) (by C-G formula based on SCr of 2.83 mg/dL (H)). Recent Labs  Lab 09/11/18 0230 09/12/18 0205 09/13/18 0220 09/14/18 0418  WBC 7.6 7.5 10.1 13.6*    Liver Function Tests: Recent Labs  Lab 09/13/18 0220 09/14/18 0418  AST 13* 10*  ALT 13 13  ALKPHOS 45 46  BILITOT 0.5 0.4  PROT 6.5 6.8  ALBUMIN 2.8* 2.8*   No results for input(s): LIPASE, AMYLASE in the last 168 hours. No results for input(s): AMMONIA in the last 168 hours.  ABG    Component Value Date/Time   PHART 7.446 09/13/2018 0345   PCO2ART 51.7 (H) 09/13/2018 0345   PO2ART 58.1 (L) 09/13/2018 0345    HCO3 34.9 (H) 09/13/2018 0345   TCO2 22 09/03/2014 0002   ACIDBASEDEF 1.3 07/27/2018 0421   O2SAT 88.4 09/13/2018 0345     Coagulation Profile: No results for input(s): INR, PROTIME in the last 168 hours.  Cardiac Enzymes: No results for input(s): CKTOTAL, CKMB, CKMBINDEX, TROPONINI in the last 168 hours.  HbA1C: Hgb A1c MFr Bld  Date/Time Value Ref Range Status  06/16/2018 08:15 PM 9.9 (H) 4.8 - 5.6 % Final    Comment:    (NOTE)         Prediabetes: 5.7 - 6.4  Diabetes: >6.4         Glycemic control for adults with diabetes: <7.0   04/04/2017 02:04 AM 5.8 (H) 4.8 - 5.6 % Final    Comment:    (NOTE)         Prediabetes: 5.7 - 6.4         Diabetes: >6.4         Glycemic control for adults with diabetes: <7.0     CBG: Recent Labs  Lab 09/13/18 1549 09/13/18 1918 09/13/18 2333 09/14/18 0354 09/14/18 0803  GLUCAP 189* 195* 191* 168* 211*     Critical care time:    CRITICAL CARE Performed by: Johnsie Cancel   Total critical care time: 40 minutes  Critical care time was exclusive of separately billable procedures and treating other patients.  Critical care was necessary to treat or prevent imminent or life-threatening deterioration.  Critical care was time spent personally by me on the following activities: development of treatment plan with patient and/or surrogate as well as nursing, discussions with consultants, evaluation of patient's response to treatment, examination of patient, obtaining history from patient or surrogate, ordering and performing treatments and interventions, ordering and review of laboratory studies, ordering and review of radiographic studies, pulse oximetry and re-evaluation of patient's condition.   Johnsie Cancel, NP-C [facility] Pgr: [phone] or if no answer [phone] 09/14/18

## 2018-09-14 NOTE — Progress Notes (Signed)
[place] Progress Note Patient Name: TIYANA GALLA DOB: 1940/05/23 MRN: [id number]   Date of Service  09/14/2018  HPI/Events of Note  Notified by bedside ICU RN that patient was extubated earlier today and has no enteric access.    Reviewed meds and she has trazodone due tonight.  ON video assessment, patient is awake and alert.  She is sitting upright, having some ice chips.  eICU Interventions  Hold off on Trazodone.     Intervention Category Minor Interventions: Other:  Elsie Lincoln 09/14/2018, 9:07 PM

## 2018-09-15 DIAGNOSIS — N189 Chronic kidney disease, unspecified: Secondary | ICD-10-CM

## 2018-09-15 LAB — CBC
HCT: 28 % — ABNORMAL LOW (ref 36.0–46.0)
Hemoglobin: 8.4 g/dL — ABNORMAL LOW (ref 12.0–15.0)
MCH: 29.2 pg (ref 26.0–34.0)
MCHC: 30 g/dL (ref 30.0–36.0)
MCV: 97.2 fL (ref 80.0–100.0)
Platelets: 215 10*3/uL (ref 150–400)
RBC: 2.88 MIL/uL — ABNORMAL LOW (ref 3.87–5.11)
RDW: 17.8 % — ABNORMAL HIGH (ref 11.5–15.5)
WBC: 13.5 10*3/uL — ABNORMAL HIGH (ref 4.0–10.5)
nRBC: 0 % (ref 0.0–0.2)

## 2018-09-15 LAB — GLUCOSE, CAPILLARY
Glucose-Capillary: 112 mg/dL — ABNORMAL HIGH (ref 70–99)
Glucose-Capillary: 118 mg/dL — ABNORMAL HIGH (ref 70–99)
Glucose-Capillary: 118 mg/dL — ABNORMAL HIGH (ref 70–99)
Glucose-Capillary: 171 mg/dL — ABNORMAL HIGH (ref 70–99)
Glucose-Capillary: 292 mg/dL — ABNORMAL HIGH (ref 70–99)

## 2018-09-15 LAB — BASIC METABOLIC PANEL
Anion gap: 13 (ref 5–15)
BUN: 141 mg/dL — ABNORMAL HIGH (ref 8–23)
CO2: 35 mmol/L — ABNORMAL HIGH (ref 22–32)
Calcium: 8.9 mg/dL (ref 8.9–10.3)
Chloride: 97 mmol/L — ABNORMAL LOW (ref 98–111)
Creatinine, Ser: 2.78 mg/dL — ABNORMAL HIGH (ref 0.44–1.00)
GFR calc Af Amer: 18 mL/min — ABNORMAL LOW (ref >60)
GFR calc non Af Amer: 16 mL/min — ABNORMAL LOW (ref >60)
Glucose, Bld: 118 mg/dL — ABNORMAL HIGH (ref 70–99)
Potassium: 3.8 mmol/L (ref 3.5–5.1)
Sodium: 145 mmol/L (ref 135–145)

## 2018-09-15 MED ORDER — TRAZODONE HCL 50 MG PO TABS
50.0000 mg | ORAL_TABLET | Freq: Every day | ORAL | Status: DC
  Administered 2018-09-15 – 2018-09-20 (×6): 50 mg via ORAL
  Filled 2018-09-15 (×6): qty 1

## 2018-09-15 MED ORDER — VITAMIN B-6 100 MG PO TABS
100.0000 mg | ORAL_TABLET | Freq: Every day | ORAL | Status: DC
  Administered 2018-09-16 – 2018-09-21 (×6): 100 mg via ORAL
  Filled 2018-09-15 (×6): qty 1

## 2018-09-15 MED ORDER — PANTOPRAZOLE SODIUM 40 MG PO TBEC
40.0000 mg | DELAYED_RELEASE_TABLET | Freq: Every day | ORAL | Status: DC
  Administered 2018-09-16 – 2018-09-21 (×6): 40 mg via ORAL
  Filled 2018-09-15 (×6): qty 1

## 2018-09-15 MED ORDER — VITAMIN B-12 1000 MCG PO TABS
1000.0000 ug | ORAL_TABLET | Freq: Every day | ORAL | Status: DC
  Administered 2018-09-16 – 2018-09-21 (×6): 1000 ug via ORAL
  Filled 2018-09-15 (×6): qty 1

## 2018-09-15 MED ORDER — MONTELUKAST SODIUM 10 MG PO TABS
10.0000 mg | ORAL_TABLET | Freq: Every day | ORAL | Status: DC
  Administered 2018-09-15 – 2018-09-20 (×6): 10 mg via ORAL
  Filled 2018-09-15 (×6): qty 1

## 2018-09-15 MED ORDER — SIMVASTATIN 20 MG PO TABS
20.0000 mg | ORAL_TABLET | Freq: Every day | ORAL | Status: DC
  Administered 2018-09-16 – 2018-09-21 (×6): 20 mg via ORAL
  Filled 2018-09-15 (×3): qty 1
  Filled 2018-09-15: qty 2
  Filled 2018-09-15 (×2): qty 1

## 2018-09-15 MED ORDER — HYDRALAZINE HCL 50 MG PO TABS
50.0000 mg | ORAL_TABLET | Freq: Three times a day (TID) | ORAL | Status: DC
  Administered 2018-09-15 – 2018-09-17 (×5): 50 mg via ORAL
  Filled 2018-09-15 (×5): qty 1

## 2018-09-15 MED ORDER — NITROGLYCERIN 2 % TD OINT
1.0000 [in_us] | TOPICAL_OINTMENT | Freq: Three times a day (TID) | TRANSDERMAL | Status: AC
  Administered 2018-09-15 (×2): 1 [in_us] via TOPICAL

## 2018-09-15 MED ORDER — ISOSORBIDE MONONITRATE ER 30 MG PO TB24
30.0000 mg | ORAL_TABLET | Freq: Every day | ORAL | Status: DC
  Administered 2018-09-16 – 2018-09-21 (×6): 30 mg via ORAL
  Filled 2018-09-15 (×6): qty 1

## 2018-09-15 MED ORDER — DOCUSATE SODIUM 100 MG PO CAPS
100.0000 mg | ORAL_CAPSULE | Freq: Every day | ORAL | Status: DC
  Administered 2018-09-16 – 2018-09-21 (×6): 100 mg via ORAL
  Filled 2018-09-15 (×5): qty 1

## 2018-09-15 MED ORDER — AMLODIPINE BESYLATE 10 MG PO TABS
10.0000 mg | ORAL_TABLET | Freq: Every day | ORAL | Status: DC
  Administered 2018-09-16 – 2018-09-21 (×6): 10 mg via ORAL
  Filled 2018-09-15 (×6): qty 1

## 2018-09-15 MED ORDER — SENNA 8.6 MG PO TABS
1.0000 | ORAL_TABLET | Freq: Two times a day (BID) | ORAL | Status: DC
  Administered 2018-09-15 – 2018-09-21 (×11): 8.6 mg via ORAL
  Filled 2018-09-15 (×11): qty 1

## 2018-09-15 MED ORDER — VITAMIN E 180 MG (400 UNIT) PO CAPS
400.0000 [IU] | ORAL_CAPSULE | Freq: Every day | ORAL | Status: DC
  Administered 2018-09-16 – 2018-09-21 (×6): 400 [IU] via ORAL
  Filled 2018-09-15 (×6): qty 1

## 2018-09-15 NOTE — Progress Notes (Signed)
Progress Note  Patient Name: [name] [facility] Date of Encounter: 09/15/2018  Primary Cardiologist: Buford Dresser, MD  Subjective   Extubated yesterday.  Net negative 1.7L yesterday on IV lasix 40 mg BID, with stable renal function (Cr 2.83 ->2.78).  More alert today, oriented x3.  Reports dyspnea improving.  Denies any chest pain.  Inpatient Medications    Scheduled Meds: . amLODipine  10 mg Per Tube Daily  . budesonide (PULMICORT) nebulizer solution  0.5 mg Nebulization BID  . chlorhexidine gluconate (MEDLINE KIT)  15 mL Mouth Rinse BID  . Chlorhexidine Gluconate Cloth  6 each Topical Daily  . docusate  100 mg Per Tube Daily  . feeding supplement (PRO-STAT SUGAR FREE 64)  30 mL Per Tube Daily  . folic acid  0.5 mg Oral Daily  . furosemide  40 mg Intravenous BID  . heparin  5,000 Units Subcutaneous Q8H  . hydrALAZINE  50 mg Per Tube Q8H  . insulin aspart  0-15 Units Subcutaneous Q4H  . ipratropium-albuterol  3 mL Nebulization TID  . iron polysaccharides  150 mg Oral BID  . montelukast  10 mg Per Tube QHS  . multivitamin  1 tablet Oral Daily  . nitroGLYCERIN  1 inch Topical Q8H  . omega-3 acid ethyl esters  1 g Oral Daily  . pantoprazole sodium  40 mg Per Tube Q1200  . pyridOXINE  100 mg Per Tube Daily  . senna  1 tablet Per Tube BID  . simvastatin  20 mg Per Tube Daily  . sodium chloride flush  10-40 mL Intracatheter Q12H  . traZODone  50 mg Per Tube QHS  . vitamin B-12  1,000 mcg Per Tube Daily  . vitamin E  400 Units Per Tube Daily   Continuous Infusions: . dexmedetomidine (PRECEDEX) IV infusion Stopped (09/11/18 2358)  . feeding supplement (VITAL HIGH PROTEIN) Stopped (09/14/18 1207)   PRN Meds: acetaminophen **OR** acetaminophen, alum & mag hydroxide-simeth, cyclobenzaprine, hydrALAZINE, oxyCODONE-acetaminophen   Vital Signs    Vitals:   09/15/18 0300 09/15/18 0400 09/15/18 0500 09/15/18 0600  BP: (!) 158/62 138/61 (!) 151/69 (!) 135/55  Pulse:       Resp: (!) 27 20 (!) 28 (!) 33  Temp: 99 F (37.2 C) 99 F (37.2 C) 99.1 F (37.3 C) 99 F (37.2 C)  TempSrc:      SpO2:      Weight:      Height:        Intake/Output Summary (Last 24 hours) at 09/15/2018 0706 Last data filed at 09/15/2018 0500 Gross per 24 hour  Intake 89.98 ml  Output 1825 ml  Net -1735.02 ml   Filed Weights   09/12/18 0500 09/13/18 0447 09/14/18 0500  Weight: 82.8 kg 81.5 kg 84.8 kg    Physical Exam   GEN:No acute distress.   Neck:+ JVD Cardiac:RRR, 3/6 systolic murmur loudest at RUSB Respiratory:Bibasilar crackles QB:VQXI, nontender, non-distended  HW:TUUEK edema Neuro:Nonfocal  Psych: Normal affect   Labs    Chemistry Recent Labs  Lab 09/13/18 0220 09/14/18 0418 09/15/18 0448  NA 143 143 145  K 4.1 4.0 3.8  CL 100 99 97*  CO2 33* 35* 35*  GLUCOSE 191* 209* 118*  BUN 110* 147* 141*  CREATININE 2.86* 2.83* 2.78*  CALCIUM 8.6* 8.5* 8.9  PROT 6.5 6.8  --   ALBUMIN 2.8* 2.8*  --   AST 13* 10*  --   ALT 13 13  --   ALKPHOS 45  38  --   BILITOT 0.5 0.4  --   GFRNONAA 15* 16* 16*  GFRAA 18* 18* 18*  ANIONGAP _0 Hematology Recent Labs  Lab 09/13/18 0220 09/14/18 0418 09/15/18 0448  WBC 10.1 13.6* 13.5*  RBC 2.86* 2.83* 2.88*  HGB 8.1* 8.4* 8.4*  HCT 27.2* 27.3* 28.0*  MCV 95.1 96.5 97.2  MCH 28.3 29.7 29.2  MCHC 29.8* 30.8 30.0  RDW 18.5* 18.3* 17.8*  PLT 215 197 215    Cardiac EnzymesNo results for input(s): TROPONINI in the last 168 hours. No results for input(s): TROPIPOC in the last 168 hours.   BNPNo results for input(s): BNP, PROBNP in the last 168 hours.   DDimer No results for input(s): DDIMER in the last 168 hours.   Radiology    No results found. Telemetry    09/14/2018 NSR HR 80-90's  - Personally Reviewed  ECG    No new tracing as of 09/14/2018- Personally Reviewed  Cardiac Studies   Echocardiogram 09/11/2018:  1. The left ventricle has hyperdynamic systolic function, with an  ejection fraction of >65%. The cavity size was normal. There is moderate concentric left ventricular hypertrophy. Left ventricular diastolic Doppler parameters are consistent with  pseudonormalization. Elevated mean left atrial pressure No evidence of left ventricular regional wall motion abnormalities. 2. The right ventricle has normal systolic function. The cavity was mildly enlarged. There is mildly increased right ventricular wall thickness. Right ventricular systolic pressure is moderately elevated. 3. Left atrial size was moderately dilated. 4. Right atrial size was mildly dilated. 5. Small pericardial effusion. 6. The pericardial effusion is circumferential. 7. The MR jet is centrally-directed. 8. The aortic valve is tricuspid. Mild thickening of the aortic valve. Moderate calcification of the aortic valve. Mild-moderate stenosis of the aortic valve. 9. The aorta is not well visualized unless otherwise noted. 10. The inferior vena cava was dilated in size with <50% respiratory variability.  Patient Profile     78 y.o. female  with acute on chronic respiratory failure/hypoxia, longstanding severe COPD, chronic pulmonary artery hypertension, acute on chronic diastolic heart failure moderate aortic stenosis, mitral insufficiency with varying severity, CKD stage IV, DM  Assessment & Plan    Acute on chronic hypoxic respiratory failure: multifactorial in setting of COPD exacerbation and decompensated diastolic heart failure.  Intubated 9/1, extubated 9/4  - Diuresed well yesterday with stable renal function, would continue IV lasix 40 mg BID today  Moderate Aortic stenosis: MG 26, AVA 1.5, Vmax 3.7 m/s.  Gradients likely exaggerated by hyperdynamic systolic function  Chronic diastolic HF: net negative 10L on admission -Continue diuresis with IV Lasix 32m BID   COPD: On BiPAP 09/11/2018>>intubated for respiratory failure after failed Bipap therapy -CCMmanaging: on Pulmicort,  DuoNeb  CKD stage IV: Creatinine, 2.30, 2.49, 2.57>2.94>2.86>2.83>2.78 today -Monitor creatinine closely with daily labs  HTN: on amlodipine 10 mg, hydralazine 50 mg TID, nitropaste 1 inch TID - Will stop nitropaste today and switch to imdur tomorrow after nitrate free interval  DM2: SSI for glucose control while inpatient status    CDonato Heinz MD  For questions or updates, please contact   Please consult [email or web address] for contact info under Cardiology/STEMI.

## 2018-09-15 NOTE — Progress Notes (Signed)
NAME:  [name], MRN:  [id number], DOB:  [birth year], LOS: 12 ADMISSION DATE:  09/03/2018, CONSULTATION DATE:  09/11/2018 REFERRING MD:  TRH-Dr. Louanne Belton, CHIEF COMPLAINT:  Acute Respiratory Failure   Brief History   [name] is a 78 yo [place] female who presented with acute COPD exacerbation subsequently resulting in acute on chronic hypoxic respiratory failure requiring mechanincal ventilation 9/1. Continued attempts to wean seen with hypoxia, will attempt SBT again today.   Past Medical History  CHF COPD Pulmonary HTN CKD stage 4 Type 2 diabetes  Essential hypertension  Hyperlipidemia  Significant Hospital Events   8/24 Admit  9/01 Intubated for respiratory distress 9/4 Extubated  Consults:  PCCM  Procedures:  ETT 9/1 >>  L TLC 9/1 >>   Significant Diagnostic Tests:  ECHO 9/1 >> LVEF >65%, RV with normal systolic function, cavity mildly enlarged, RA mildly dilated, small circumferential pericardial effusion  Micro Data:  COVID 8/24 >> negative MRSA 8/24 >>positive COVID 8/31 >> negative  Antimicrobials:    Interim history/subjective:  Extubated yesterday.  Stable overnight   Objective   Blood pressure (!) 135/55, pulse 83, temperature 99 F (37.2 C), resp. rate (!) 33, height 5\' 5"  (1.651 m), weight 84.8 kg, SpO2 100 %.        Intake/Output Summary (Last 24 hours) at 09/15/2018 1001 Last data filed at 09/15/2018 0500 Gross per 24 hour  Intake 69.98 ml  Output 1825 ml  Net -1755.02 ml   Filed Weights   09/12/18 0500 09/13/18 0447 09/14/18 0500  Weight: 82.8 kg 81.5 kg 84.8 kg    Examination: Gen:      No acute distress HEENT:  EOMI, sclera anicteric Neck:     No masses; no thyromegaly Lungs:    Clear to auscultation bilaterally; normal respiratory effort CV:         Regular rate and rhythm; no murmurs Abd:      + bowel sounds; soft, non-tender; no palpable masses, no distension Ext:    No edema; adequate peripheral perfusion Skin:       Warm and dry; no rash Neuro: Awake, responsive.  No focal deficits.  Resolved Hospital Problem list   Acute metabolic Encephalopathy   -With correction of acidosis and decreased sedation  Assessment & Plan:  78 year old female with extensive PMH who presents to PCCM with respiratory failure that is likely multi-factorial with AMS and inability to protect her airway requiring initiation of mechanical ventilation.  Acute Hypoxemic Respiratory Failure - in setting of acute on chronic COPD, pulmonary edema, pulmonary HTN. Ruled out infectious etiology  P: Stable post extubation Wean down FiO2 as tolerated Follow intermittent chest x-ray Continue diuresis.  Pulmonary HTN -PASP on ECHO 60, no RHC on file  P: Goal of sats > 88% Continue supportive care   Aortic stenosis, mitral regurgitation  -MR noted with central directed jet and mild to moderated aortic stenosis seen on ECHO 9/1. Cardiology following P: Cardiology following, no further interventions indicated   COPD P: Continue guideline treatment including inhaled steroids and bronchodilators  Continue PRN Duonebs and albuterol  Hold systemic steroids given no signs of current excerebration   AKI with history of CKD stage 4 Baseline creatine between 2.3-2.5 P: Avoid nephrotoxins, ensure adequate renal perfusion  Follow BMP/ renal output  Follow and replace electrolytes as needed   At Risk Malnutrition  P:  Start p.o. diet  Best practice:  Diet: PO diet Pain/Anxiety/Delirium protocol (if indicated): NA VAP protocol (  if indicated): NA DVT prophylaxis: SQ heparin GI prophylaxis: PPI Glucose control: SSI Mobility: BR Code Status: LCB Family Communication: Patient updated. Disposition: Transfer out of ICU and [facility] service.  Labs   CBC: Recent Labs  Lab 09/11/18 0230 09/12/18 0205 09/13/18 0220 09/14/18 0418 09/15/18 0448  WBC 7.6 7.5 10.1 13.6* 13.5*  HGB 9.1* 8.2* 8.1* 8.4* 8.4*  HCT 31.6* 27.2*  27.2* 27.3* 28.0*  MCV 99.1 94.8 95.1 96.5 97.2  PLT 228 240 215 197 025    Basic Metabolic Panel: Recent Labs  Lab 09/11/18 0230 09/12/18 0205 09/12/18 1213 09/12/18 1745 09/13/18 0220 09/13/18 2100 09/14/18 0418 09/15/18 0448  NA 140 142  --   --  143  --  143 145  K 5.5* 5.2*  --   --  4.1  --  4.0 3.8  CL 98 101  --   --  100  --  99 97*  CO2 32 31  --   --  33*  --  35* 35*  GLUCOSE 217* 100*  --   --  191*  --  209* 118*  BUN 119* 122*  --   --  110*  --  147* 141*  CREATININE 2.57* 2.94*  --   --  2.86*  --  2.83* 2.78*  CALCIUM 9.2 8.7*  --   --  8.6*  --  8.5* 8.9  MG  --  2.5* 2.3 2.3 2.3 2.4  --   --   PHOS  --  3.2 3.5 4.2 4.2 4.0  --   --    GFR: Estimated Creatinine Clearance: 18.5 mL/min (A) (by C-G formula based on SCr of 2.78 mg/dL (H)). Recent Labs  Lab 09/12/18 0205 09/13/18 0220 09/14/18 0418 09/15/18 0448  WBC 7.5 10.1 13.6* 13.5*    Liver Function Tests: Recent Labs  Lab 09/13/18 0220 09/14/18 0418  AST 13* 10*  ALT 13 13  ALKPHOS 45 46  BILITOT 0.5 0.4  PROT 6.5 6.8  ALBUMIN 2.8* 2.8*   No results for input(s): LIPASE, AMYLASE in the last 168 hours. No results for input(s): AMMONIA in the last 168 hours.  ABG    Component Value Date/Time   PHART 7.446 09/13/2018 0345   PCO2ART 51.7 (H) 09/13/2018 0345   PO2ART 58.1 (L) 09/13/2018 0345   HCO3 34.9 (H) 09/13/2018 0345   TCO2 22 09/03/2014 0002   ACIDBASEDEF 1.3 07/27/2018 0421   O2SAT 88.4 09/13/2018 0345     Coagulation Profile: No results for input(s): INR, PROTIME in the last 168 hours.  Cardiac Enzymes: No results for input(s): CKTOTAL, CKMB, CKMBINDEX, TROPONINI in the last 168 hours.  HbA1C: Hgb A1c MFr Bld  Date/Time Value Ref Range Status  06/16/2018 08:15 PM 9.9 (H) 4.8 - 5.6 % Final    Comment:    (NOTE)         Prediabetes: 5.7 - 6.4         Diabetes: >6.4         Glycemic control for adults with diabetes: <7.0   04/04/2017 02:04 AM 5.8 (H) 4.8 - 5.6 %  Final    Comment:    (NOTE)         Prediabetes: 5.7 - 6.4         Diabetes: >6.4         Glycemic control for adults with diabetes: <7.0     CBG: Recent Labs  Lab 09/14/18 1547 09/14/18 1945 09/14/18 2351 09/15/18 0348 09/15/18 0750  GLUCAP 202* [place] MD [facility] 09/15/2018, 10:04 AM

## 2018-09-15 NOTE — Progress Notes (Signed)
BSE completed, full report to follow. NO indication of severe oropharyngeal dysphagia nor aspiration. Pt does report h/o reflux and is on a PPI.  Recommend regular/thin diet - SLP will follow up x1 to assure tolerance given her heart failure and 3 day intubation.  Pt educated using teach back to importance of aspiration precautions.  Luanna Salk, [place] [facility] SLP Acute Rehab Services Pager [phone] Office [phone]

## 2018-09-15 NOTE — Evaluation (Signed)
Clinical/Bedside Swallow Evaluation Patient Details  Name: [name] MRN: [id number] Date of Birth: 03/31/1940  Today's Date: 09/15/2018 Time: SLP Start Time (ACUTE ONLY): 66 SLP Stop Time (ACUTE ONLY): 1420 SLP Time Calculation (min) (ACUTE ONLY): 25 min  Past Medical History:  Past Medical History:  Diagnosis Date  . Adenomatous colon polyp   . Anemia   . Arthritis   . Asthma   . COPD (chronic obstructive pulmonary disease) ([place])   . COPD with asthma ([place]) 04/20/2011  . Depression   . Diabetes mellitus   . Diverticulosis   . Gastric ulcer   . GERD (gastroesophageal reflux disease)   . HTN (hypertension)   . Hypercholesterolemia   . Internal hemorrhoids   . Lumbar disc disease   . On home oxygen therapy    "2-3L @ bedtime" (04/04/2017)  . OSA (obstructive sleep apnea) 09/17/2012  . Peptic ulcer disease   . 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 Umbilical hernia   . Valgus foot    right    Past Surgical History:  Past Surgical History:  Procedure Laterality Date  . COLONOSCOPY  2209 or 2010    2001, 2005, 2009  colonoscopy.  [place]  . COLONOSCOPY N/A 08/14/2012   Procedure: COLONOSCOPY;  Surgeon: Ladene Artist, MD;  Location: [facility] ENDOSCOPY;  Service: Endoscopy;  Laterality: N/A;  . ESOPHAGOGASTRODUODENOSCOPY (EGD) WITH PROPOFOL N/A 07/25/2018   Procedure: ESOPHAGOGASTRODUODENOSCOPY (EGD) WITH PROPOFOL;  Surgeon: Gatha Mayer, MD;  Location: [facility];  Service: Endoscopy;  Laterality: N/A;  . INCISE AND DRAIN ABCESS     Abdominal Wall  . LIPOMA RESECTION     Right Neck  . TOTAL KNEE ARTHROPLASTY     Bilateral  . UMBILICAL HERNIA REPAIR  2007   incarcerated  . UPPER GASTROINTESTINAL ENDOSCOPY  2001   medoff Grade A esophagitis   HPI:  pt is a 78 yo female adm to [facility] with respiratory failure due to COPD and Heart Failure.  Pt required intubation 9/1-9/4.  Swallow eval ordered. She reports h/o GERD and is currently on Protonix.  Pt denies  dysphagia prior to admit.   Assessment / Plan / Recommendation Clinical Impression  NO indication of severe oropharyngeal dysphagia nor aspiration. Pt does report h/o reflux and is on a PPI.  Recommend regular/thin diet - SLP will follow up x1 to assure tolerance given her heart failure and 3 day intubation.  Pt educated using teach back to importance of aspiration precautions. Luanna Salk, MS [facility] SLP Visit Diagnosis: Dysphagia, unspecified (R13.10)    Aspiration Risk  Mild aspiration risk    Diet Recommendation Regular;Thin liquid   Liquid Administration via: Cup;Straw Medication Administration: Whole meds with liquid Supervision: Patient able to self feed Compensations: Slow rate;Small sips/bites Postural Changes: Seated upright at 90 degrees    Other  Recommendations Oral Care Recommendations: Oral care BID   Follow up Recommendations None      Frequency and Duration min 1 x/week  1 week       Prognosis        Swallow Study   General Date of Onset: 09/15/18 HPI: pt is a 78 yo female adm to [facility] with respiratory failure due to COPD and Heart Failure.  Pt required intubation 9/1-9/4.  Swallow eval ordered. She reports h/o GERD and is currently on Protonix.  Pt denies dysphagia prior to admit. Type of Study: Bedside Swallow Evaluation Diet Prior to this Study: NPO Temperature  Spikes Noted: No Respiratory Status: Nasal cannula History of Recent Intubation: Yes Length of Intubations (days): 4 days Date extubated: 09/14/18 Behavior/Cognition: Alert;Cooperative;Pleasant mood Oral Cavity Assessment: Within Functional Limits Oral Care Completed by SLP: No Oral Cavity - Dentition: (partials) Self-Feeding Abilities: Able to feed self Patient Positioning: Upright in bed Baseline Vocal Quality: Normal Volitional Cough: Strong Volitional Swallow: Able to elicit    Oral/Motor/Sensory Function     Ice Chips Ice chips: Within functional limits   Thin  Liquid Thin Liquid: Within functional limits    Nectar Thick Nectar Thick Liquid: Not tested   Honey Thick Honey Thick Liquid: Not tested   Puree Puree: Within functional limits Presentation: Self Fed;Spoon   Solid     Solid: Within functional limits Presentation: Self Fed Other Comments: minimal oral residuals due to partials, clears      Macario Golds 09/15/2018,2:31 PM   Luanna Salk, MS [facility] SLP [place] Pager [phone] Office [phone]

## 2018-09-15 NOTE — Progress Notes (Addendum)
Physical Therapy Treatment Patient Details Name: [name] MRN: [id number] DOB: 02-22-1940 Today's Date: 09/15/2018    History of Present Illness 78 yo female admitted from [facility] with shortness of breath, CXR reveals pulmonary edema consistent with heart failure. 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 Pt required mechanincal ventilation 9/1-9/4.    PT Comments    Pt assisted with sitting EOB and performed a few LE exercises.  Pt's oxygen remained 91 - 98% on 6L HFNC.  Pt eager to mobilize and reports feeling better after session.     Follow Up Recommendations  SNF;Supervision/Assistance - 24 hour     Equipment Recommendations  None recommended by PT    Recommendations for Other Services       Precautions / Restrictions Precautions Precautions: Fall Precaution Comments: monitor sats, remained on 6L HFNC 9/5    Mobility  Bed Mobility Overal bed mobility: Needs Assistance Bed Mobility: Supine to Sit;Sit to Supine     Supine to sit: HOB elevated;Min assist Sit to supine: Mod assist   General bed mobility comments: pt self assisting as able, assist to scoot to EOB using bed pad; assist for LEs onto bed (due to height of bed, pt may have been able to assist if feet better able to reach floor)  Transfers                 General transfer comment: NT (would prefer +2 for safety, pt also just extubated yesterday so RN agreeable to mobilize slowly)  Ambulation/Gait                 Stairs             Wheelchair Mobility    Modified Rankin (Stroke Patients Only)       Balance Overall balance assessment: Needs assistance Sitting-balance support: No upper extremity supported;Feet unsupported Sitting balance-Leahy Scale: Fair Sitting balance - Comments: able to sit unsupported                                    Cognition  Arousal/Alertness: Awake/alert Behavior During Therapy: WFL for tasks assessed/performed Overall Cognitive Status: Within Functional Limits for tasks assessed                                        Exercises General Exercises - Lower Extremity Long Arc Quad: AROM;Both;10 reps;Seated Hip ABduction/ADduction: AROM;Both;10 reps;Seated Hip Flexion/Marching: AROM;Both;10 reps;Seated    General Comments General comments (skin integrity, edema, etc.): Pt on 6L HFNC and SPO2 91-98% during session      Pertinent Vitals/Pain Pain Assessment: No/denies pain    Home Living                      Prior Function            PT Goals (current goals can now be found in the care plan section) Acute Rehab PT Goals PT Goal Formulation: With patient Time For Goal Achievement: 09/29/18 Potential to Achieve Goals: Good Progress towards PT goals: Progressing toward goals    Frequency    Min 2X/week      PT Plan Current plan remains appropriate    Co-evaluation  AM-PAC PT "6 Clicks" Mobility   Outcome Measure  Help needed turning from your back to your side while in a flat bed without using bedrails?: A Little Help needed moving from lying on your back to sitting on the side of a flat bed without using bedrails?: A Lot Help needed moving to and from a bed to a chair (including a wheelchair)?: A Lot Help needed standing up from a chair using your arms (e.g., wheelchair or bedside chair)?: Total Help needed to walk in hospital room?: Total Help needed climbing 3-5 steps with a railing? : Total 6 Click Score: 10    End of Session Equipment Utilized During Treatment: Oxygen Activity Tolerance: Patient tolerated treatment well Patient left: in bed;with call bell/phone within reach;with bed alarm set;with SCD's reapplied(prevalon boots also reapplied) Nurse Communication: Mobility status PT Visit Diagnosis: Other abnormalities of gait and mobility  (R26.89);Muscle weakness (generalized) (M62.81)     Time: 0092-3300 PT Time Calculation (min) (ACUTE ONLY): 26 min  Charges:  $Therapeutic Activity: 23-37 mins                     Carmelia Bake, PT, DPT Acute Rehabilitation Services Office: [phone] Pager: [phone]  Trena Platt 09/15/2018, 1:53 PM

## 2018-09-16 DIAGNOSIS — I350 Nonrheumatic aortic (valve) stenosis: Secondary | ICD-10-CM

## 2018-09-16 LAB — PREPARE RBC (CROSSMATCH)

## 2018-09-16 LAB — CBC
HCT: 23 % — ABNORMAL LOW (ref 36.0–46.0)
Hemoglobin: 6.9 g/dL — CL (ref 12.0–15.0)
MCH: 29 pg (ref 26.0–34.0)
MCHC: 30 g/dL (ref 30.0–36.0)
MCV: 96.6 fL (ref 80.0–100.0)
Platelets: 179 10*3/uL (ref 150–400)
RBC: 2.38 MIL/uL — ABNORMAL LOW (ref 3.87–5.11)
RDW: 17.5 % — ABNORMAL HIGH (ref 11.5–15.5)
WBC: 10.3 10*3/uL (ref 4.0–10.5)
nRBC: 0 % (ref 0.0–0.2)

## 2018-09-16 LAB — BASIC METABOLIC PANEL
Anion gap: 13 (ref 5–15)
BUN: 111 mg/dL — ABNORMAL HIGH (ref 8–23)
CO2: 29 mmol/L (ref 22–32)
Calcium: 7.4 mg/dL — ABNORMAL LOW (ref 8.9–10.3)
Chloride: 105 mmol/L (ref 98–111)
Creatinine, Ser: 1.96 mg/dL — ABNORMAL HIGH (ref 0.44–1.00)
GFR calc Af Amer: 28 mL/min — ABNORMAL LOW (ref >60)
GFR calc non Af Amer: 24 mL/min — ABNORMAL LOW (ref >60)
Glucose, Bld: 121 mg/dL — ABNORMAL HIGH (ref 70–99)
Potassium: 2.7 mmol/L — CL (ref 3.5–5.1)
Sodium: 147 mmol/L — ABNORMAL HIGH (ref 135–145)

## 2018-09-16 LAB — GLUCOSE, CAPILLARY
Glucose-Capillary: 137 mg/dL — ABNORMAL HIGH (ref 70–99)
Glucose-Capillary: 162 mg/dL — ABNORMAL HIGH (ref 70–99)
Glucose-Capillary: 203 mg/dL — ABNORMAL HIGH (ref 70–99)
Glucose-Capillary: 257 mg/dL — ABNORMAL HIGH (ref 70–99)
Glucose-Capillary: 270 mg/dL — ABNORMAL HIGH (ref 70–99)
Glucose-Capillary: 283 mg/dL — ABNORMAL HIGH (ref 70–99)
Glucose-Capillary: 311 mg/dL — ABNORMAL HIGH (ref 70–99)

## 2018-09-16 LAB — MAGNESIUM: Magnesium: 2 mg/dL (ref 1.7–2.4)

## 2018-09-16 LAB — HEMOGLOBIN AND HEMATOCRIT, BLOOD
HCT: 29 % — ABNORMAL LOW (ref 36.0–46.0)
Hemoglobin: 9 g/dL — ABNORMAL LOW (ref 12.0–15.0)

## 2018-09-16 LAB — PHOSPHORUS: Phosphorus: 2.9 mg/dL (ref 2.5–4.6)

## 2018-09-16 MED ORDER — POTASSIUM CHLORIDE CRYS ER 20 MEQ PO TBCR
40.0000 meq | EXTENDED_RELEASE_TABLET | Freq: Two times a day (BID) | ORAL | Status: AC
  Administered 2018-09-16 (×2): 40 meq via ORAL
  Filled 2018-09-16 (×2): qty 2

## 2018-09-16 MED ORDER — SODIUM CHLORIDE 0.9% IV SOLUTION
Freq: Once | INTRAVENOUS | Status: AC
  Administered 2018-09-16: 09:00:00 via INTRAVENOUS

## 2018-09-16 MED ORDER — FUROSEMIDE 10 MG/ML IJ SOLN
40.0000 mg | Freq: Once | INTRAMUSCULAR | Status: AC
  Administered 2018-09-16: 40 mg via INTRAVENOUS
  Filled 2018-09-16: qty 4

## 2018-09-16 NOTE — Progress Notes (Signed)
[facility]  PROGRESS NOTE  [name] [facility] BBC:[id number] DOB: Jan 11, 1940 DOA: 09/03/2018 PCP: Garwin Brothers, MD   Brief HPI:   78 year old female with a history of combined systolic and diastolic CHF, diabetes mellitus type 2, anemia, COPD, pulmonary hypertension came with shortness of breath.  Cardiology was consulted and patient started on IV Lasix.  Patient became hypoxic and was found to have acute respiratory with hypoxia and hypercapnia she was transferred to stepdown, initially placed on BiPAP.  PCCM was consulted and patient was intubated.  Patient got extubated on 09/15/2018 and transferred to floor.  [facility] resumed care on 09/16/2018.    Subjective   This morning patient still has mild tachypnea, denies chest pain.   Assessment/Plan:     1. Acute on chronic hypoxic respiratory failure-in setting of chronic COPD, pulmonary edema, pulmonary hypertension.  Patient was extubated on 09/15/2018.  Breathing has improved.  Currently she is on oxygen via nasal cannula continue with  diuresis with furosemide 40 mg IV every 12 hours.  2. AKI on CKD stage IV-patient baseline creatinine is 2.3-2.5, creatinine today has improved 1.96 with diuresis.  Will closely follow renal function as patient is on Lasix.  Avoid nephrotoxins.  3. Hypertension-blood pressure is controlled, continue amlodipine, hydralazine.  4. Diabetes mellitus type 2-CBG fairly well controlled, continue sliding scale insulin with NovoLog.  5. COPD-stable, continue PRN bronchodilators.  6. Pulmonary hypertension-continue supportive care.    7. Hypokalemia-potassium is 2.7 today.  Potassium is been replaced.  Will check BMP in a.m.  8. Anemia-patient's hemoglobin is 6.9, 1 unit PRBC given this morning.  Follow CBC in a.m.  FOBT was positive in July 2020.  She had normal EGD on 07/25/2018.  Colonoscopy was not done as patient was not deemed to be a candidate for colonoscopy.  Follow CBC in a.m., transfuse  as needed for hemoglobin less than 7.    CBG: Recent Labs  Lab 09/15/18 1939 09/15/18 2355 09/16/18 0421 09/16/18 0729 09/16/18 1201  GLUCAP 292* 203* 137* 162* 283*    CBC: Recent Labs  Lab 09/12/18 0205 09/13/18 0220 09/14/18 0418 09/15/18 0448 09/16/18 0500  WBC 7.5 10.1 13.6* 13.5* 10.3  HGB 8.2* 8.1* 8.4* 8.4* 6.9*  HCT 27.2* 27.2* 27.3* 28.0* 23.0*  MCV 94.8 95.1 96.5 97.2 96.6  PLT 240 215 197 215 503    Basic Metabolic Panel: Recent Labs  Lab 09/12/18 0205 09/12/18 1213 09/12/18 1745 09/13/18 0220 09/13/18 2100 09/14/18 0418 09/15/18 0448 09/16/18 0500  NA 142  --   --  143  --  143 145 147*  K 5.2*  --   --  4.1  --  4.0 3.8 2.7*  CL 101  --   --  100  --  99 97* 105  CO2 31  --   --  33*  --  35* 35* 29  GLUCOSE 100*  --   --  191*  --  209* 118* 121*  BUN 122*  --   --  110*  --  147* 141* 111*  CREATININE 2.94*  --   --  2.86*  --  2.83* 2.78* 1.96*  CALCIUM 8.7*  --   --  8.6*  --  8.5* 8.9 7.4*  MG 2.5* 2.3 2.3 2.3 2.4  --   --  2.0  PHOS 3.2 3.5 4.2 4.2 4.0  --   --  2.9     DVT prophylaxis: Heparin  Code Status: Partial code, no CPR or  defibrillation  Family Communication: No family at bedside  Disposition Plan: likely skilled nursing facility in 1 to 2 days.  Pressure Injury 07/05/18 Sacrum Mid Stage II -  Partial thickness loss of dermis presenting as a shallow open ulcer with a red, pink wound bed without slough. red with granulation tissue (Active)  07/05/18 1519  Location: Sacrum  Location Orientation: Mid  Staging: Stage II -  Partial thickness loss of dermis presenting as a shallow open ulcer with a red, pink wound bed without slough.  Wound Description (Comments): red with granulation tissue  Present on Admission: Yes     Pressure Injury 09/03/18 Heel Right Stage III -  Full thickness tissue loss. Subcutaneous fat may be visible but bone, tendon or muscle are NOT exposed. (Active)  09/03/18 1445  Location: Heel  Location  Orientation: Right  Staging: Stage III -  Full thickness tissue loss. Subcutaneous fat may be visible but bone, tendon or muscle are NOT exposed.  Wound Description (Comments):   Present on Admission: Yes     Pressure Injury 09/03/18 Foot Left;Other (Comment);Lateral Unstageable - Full thickness tissue loss in which the base of the ulcer is covered by slough (yellow, tan, gray, green or brown) and/or eschar (tan, brown or black) in the wound bed. (Active)  09/03/18 1445  Location: Foot  Location Orientation: Left;Other (Comment);Lateral (outer)  Staging: Unstageable - Full thickness tissue loss in which the base of the ulcer is covered by slough (yellow, tan, gray, green or brown) and/or eschar (tan, brown or black) in the wound bed.  Wound Description (Comments):   Present on Admission: Yes     Pressure Injury 09/03/18 Hip Anterior;Left Stage II -  Partial thickness loss of dermis presenting as a shallow open ulcer with a red, pink wound bed without slough. pink bed open (Active)  09/03/18 1445  Location: Hip  Location Orientation: Anterior;Left  Staging: Stage II -  Partial thickness loss of dermis presenting as a shallow open ulcer with a red, pink wound bed without slough.  Wound Description (Comments): pink bed open  Present on Admission: Yes      BMI  Estimated body mass index is 29.97 kg/m as calculated from the following:   Height as of this encounter: 5' 5" (1.651 m).   Weight as of this encounter: 81.7 kg.  Scheduled medications:  . amLODipine  10 mg Oral Daily  . budesonide (PULMICORT) nebulizer solution  0.5 mg Nebulization BID  . chlorhexidine gluconate (MEDLINE KIT)  15 mL Mouth Rinse BID  . Chlorhexidine Gluconate Cloth  6 each Topical Daily  . docusate sodium  100 mg Oral Daily  . folic acid  0.5 mg Oral Daily  . furosemide  40 mg Intravenous BID  . furosemide  40 mg Intravenous Once  . heparin  5,000 Units Subcutaneous Q8H  . hydrALAZINE  50 mg Oral Q8H  .  insulin aspart  0-15 Units Subcutaneous Q4H  . ipratropium-albuterol  3 mL Nebulization TID  . iron polysaccharides  150 mg Oral BID  . isosorbide mononitrate  30 mg Oral Daily  . montelukast  10 mg Oral QHS  . multivitamin  1 tablet Oral Daily  . omega-3 acid ethyl esters  1 g Oral Daily  . pantoprazole  40 mg Oral Daily  . potassium chloride  40 mEq Oral BID  . pyridOXINE  100 mg Oral Daily  . senna  1 tablet Oral BID  . simvastatin  20 mg Oral Daily  .  sodium chloride flush  10-40 mL Intracatheter Q12H  . traZODone  50 mg Oral QHS  . vitamin B-12  1,000 mcg Oral Daily  . vitamin E  400 Units Oral Daily    Consultants:  PCCM  Cardiology  Procedures:  ETT 9/1 to 9/5  Echocardiogram   Antibiotics:   Anti-infectives (From admission, onward)   None       Objective   Vitals:   09/16/18 0848 09/16/18 0900 09/16/18 0903 09/16/18 1200  BP:  (!) 160/59 (!) 160/55 (!) 163/72  Pulse: 76 80 75 65  Resp: (!) 23 (!) 22 (!) 22 18  Temp: 98.4 F (36.9 C) 98.4 F (36.9 C) 98.4 F (36.9 C) 98.2 F (36.8 C)  TempSrc:   Core Core  SpO2: 92% 91% (!) 88% 92%  Weight:      Height:        Intake/Output Summary (Last 24 hours) at 09/16/2018 1249 Last data filed at 09/16/2018 1200 Gross per 24 hour  Intake 712.5 ml  Output 1775 ml  Net -1062.5 ml   Filed Weights   09/13/18 0447 09/14/18 0500 09/16/18 0500  Weight: 81.5 kg 84.8 kg 81.7 kg     Physical Examination:    General-appears in no acute distress  Heart-S1-S2, regular, no murmur auscultated  Lungs-decreased breath sounds bilaterally, mild tachypnea  Abdomen-soft, nontender, no organomegaly  Extremities-no edema in the lower extremities  Neuro-alert, oriented x3, no focal deficit noted     Data Reviewed: I have personally reviewed following labs and imaging studies   Recent Results (from the past 240 hour(s))  SARS Coronavirus 2 [facility] order, Performed in [place] hospital lab)  Nasopharyngeal Nasopharyngeal Swab     Status: None   Collection Time: 09/10/18 10:43 AM   Specimen: Nasopharyngeal Swab  Result Value Ref Range Status   SARS Coronavirus 2 NEGATIVE NEGATIVE Final    Comment: (NOTE) If result is NEGATIVE SARS-CoV-2 target nucleic acids are NOT DETECTED. The SARS-CoV-2 RNA is generally detectable in upper and lower  respiratory specimens during the acute phase of infection. The lowest  concentration of SARS-CoV-2 viral copies this assay can detect is 250  copies / mL. A negative result does not preclude SARS-CoV-2 infection  and should not be used as the sole basis for treatment or other  patient management decisions.  A negative result may occur with  improper specimen collection / handling, submission of specimen other  than nasopharyngeal swab, presence of viral mutation(s) within the  areas targeted by this assay, and inadequate number of viral copies  (<250 copies / mL). A negative result must be combined with clinical  observations, patient history, and epidemiological information. If result is POSITIVE SARS-CoV-2 target nucleic acids are DETECTED. The SARS-CoV-2 RNA is generally detectable in upper and lower  respiratory specimens dur ing the acute phase of infection.  Positive  results are indicative of active infection with SARS-CoV-2.  Clinical  correlation with patient history and other diagnostic information is  necessary to determine patient infection status.  Positive results do  not rule out bacterial infection or co-infection with other viruses. If result is PRESUMPTIVE POSTIVE SARS-CoV-2 nucleic acids MAY BE PRESENT.   A presumptive positive result was obtained on the submitted specimen  and confirmed on repeat testing.  While 2019 novel coronavirus  (SARS-CoV-2) nucleic acids may be present in the submitted sample  additional confirmatory testing may be necessary for epidemiological  and / or clinical management purposes  to  differentiate  between  SARS-CoV-2 and other Sarbecovirus currently known to infect humans.  If clinically indicated additional testing with an alternate test  methodology [phone]) is advised. The SARS-CoV-2 RNA is generally  detectable in upper and lower respiratory sp ecimens during the acute  phase of infection. The expected result is Negative. Fact Sheet for Patients:  StrictlyIdeas.no Fact Sheet for Healthcare Providers: BankingDealers.co.za This test is not yet approved or cleared by the [place] FDA and has been authorized for detection and/or diagnosis of SARS-CoV-2 by FDA under an Emergency Use Authorization (EUA).  This EUA will remain in effect (meaning this test can be used) for the duration of the COVID-19 declaration under Section 564(b)(1) of the Act, 21 U.S.C. section 360bbb-3(b)(1), unless the authorization is terminated or revoked sooner. Performed at [facility], [place] [address] [place], [place] [address]      Liver Function Tests: Recent Labs  Lab 09/13/18 0220 09/14/18 0418  AST 13* 10*  ALT 13 13  ALKPHOS 45 46  BILITOT 0.5 0.4  PROT 6.5 6.8  ALBUMIN 2.8* 2.8*    BNP (last 3 results) Recent Labs    07/23/18 1459 09/01/18 1538 09/03/18 0429  BNP 466.8* 512.4* 696.0*      Admission status: Inpatient: Based on patients clinical presentation and evaluation of above clinical data, I have made determination that patient meets Inpatient criteria at this time.  Time spent: 20 min  [facility] Pager [phone]. If 7PM-7AM, please contact night-coverage at [email or web address], Office  [phone]  password [place]  09/16/2018, 12:49 PM  LOS: 13 days

## 2018-09-16 NOTE — Progress Notes (Signed)
CRITICAL VALUE ALERT  Critical Value:  K 2.7  Date & Time Notied:  09/16/2018  Provider Notified: Bodenheimer  Orders Received/Actions taken: Oral Potassium BID

## 2018-09-16 NOTE — Progress Notes (Addendum)
Progress Note  Patient Name: Edith Groleau [facility] Date of Encounter: 09/16/2018  Primary Cardiologist: Buford Dresser, MD  Subjective   Net negative 2.1L on IV lasix 40 mg BID yesterday, with significant improvement in renal function (Cr 2.78 ->1.96).  Drop in Hgb (8.4 ->6.9), 1 unit PRBCs ordered.  She reports dyspnea continues to improve.  Inpatient Medications    Scheduled Meds: . sodium chloride   Intravenous Once  . amLODipine  10 mg Oral Daily  . budesonide (PULMICORT) nebulizer solution  0.5 mg Nebulization BID  . chlorhexidine gluconate (MEDLINE KIT)  15 mL Mouth Rinse BID  . Chlorhexidine Gluconate Cloth  6 each Topical Daily  . docusate sodium  100 mg Oral Daily  . folic acid  0.5 mg Oral Daily  . furosemide  40 mg Intravenous BID  . heparin  5,000 Units Subcutaneous Q8H  . hydrALAZINE  50 mg Oral Q8H  . insulin aspart  0-15 Units Subcutaneous Q4H  . ipratropium-albuterol  3 mL Nebulization TID  . iron polysaccharides  150 mg Oral BID  . isosorbide mononitrate  30 mg Oral Daily  . montelukast  10 mg Oral QHS  . multivitamin  1 tablet Oral Daily  . omega-3 acid ethyl esters  1 g Oral Daily  . pantoprazole  40 mg Oral Daily  . potassium chloride  40 mEq Oral BID  . pyridOXINE  100 mg Oral Daily  . senna  1 tablet Oral BID  . simvastatin  20 mg Oral Daily  . sodium chloride flush  10-40 mL Intracatheter Q12H  . traZODone  50 mg Oral QHS  . vitamin B-12  1,000 mcg Oral Daily  . vitamin E  400 Units Oral Daily   Continuous Infusions:  PRN Meds: acetaminophen **OR** acetaminophen, alum & mag hydroxide-simeth, cyclobenzaprine, hydrALAZINE, oxyCODONE-acetaminophen   Vital Signs    Vitals:   09/16/18 0200 09/16/18 0300 09/16/18 0400 09/16/18 0500  BP: (!) 175/67 (!) 154/72 (!) 160/64 (!) 169/65  Pulse: 73 77 76 77  Resp: 19 18 (!) 22 (!) 21  Temp: 98.8 F (37.1 C) 98.8 F (37.1 C) 98.8 F (37.1 C) 98.8 F (37.1 C)  TempSrc:      SpO2: 99% 99% 100%  93%  Weight:    81.7 kg  Height:        Intake/Output Summary (Last 24 hours) at 09/16/2018 0707 Last data filed at 09/16/2018 0500 Gross per 24 hour  Intake 240 ml  Output 2325 ml  Net -2085 ml   Filed Weights   09/13/18 0447 09/14/18 0500 09/16/18 0500  Weight: 81.5 kg 84.8 kg 81.7 kg    Physical Exam   GEN:No acute distress.   Neck:+ JVD Cardiac:RRR, 3/6 systolic murmur loudest at RUSB Respiratory:Bibasilar crackles.  Work of breathing has improved SW:FUXN, nontender, non-distended  AT:FTDDU edema Neuro:Nonfocal  Psych: Normal affect   Labs    Chemistry Recent Labs  Lab 09/13/18 0220 09/14/18 0418 09/15/18 0448 09/16/18 0500  NA 143 143 145 147*  K 4.1 4.0 3.8 2.7*  CL 100 99 97* 105  CO2 33* 35* 35* 29  GLUCOSE 191* 209* 118* 121*  BUN 110* 147* 141* 111*  CREATININE 2.86* 2.83* 2.78* 1.96*  CALCIUM 8.6* 8.5* 8.9 7.4*  PROT 6.5 6.8  --   --   ALBUMIN 2.8* 2.8*  --   --   AST 13* 10*  --   --   ALT 13 13  --   --  ALKPHOS 45 46  --   --   BILITOT 0.5 0.4  --   --   GFRNONAA 15* 16* 16* 24*  GFRAA 18* 18* 18* 28*  ANIONGAP '10 9 13 13     ' Hematology Recent Labs  Lab 09/14/18 0418 09/15/18 0448 09/16/18 0500  WBC 13.6* 13.5* 10.3  RBC 2.83* 2.88* 2.38*  HGB 8.4* 8.4* 6.9*  HCT 27.3* 28.0* 23.0*  MCV 96.5 97.2 96.6  MCH 29.7 29.2 29.0  MCHC 30.8 30.0 30.0  RDW 18.3* 17.8* 17.5*  PLT 197 215 179    Cardiac EnzymesNo results for input(s): TROPONINI in the last 168 hours. No results for input(s): TROPIPOC in the last 168 hours.   BNPNo results for input(s): BNP, PROBNP in the last 168 hours.   DDimer No results for input(s): DDIMER in the last 168 hours.   Radiology    No results found. Telemetry    Wenckebach yesterday, resolved.  Currently in NSR with rate 70-80s - Personally Reviewed  ECG    No new tracing - Personally Reviewed  Cardiac Studies   Echocardiogram 09/11/2018:  1. The left ventricle has hyperdynamic  systolic function, with an ejection fraction of >65%. The cavity size was normal. There is moderate concentric left ventricular hypertrophy. Left ventricular diastolic Doppler parameters are consistent with  pseudonormalization. Elevated mean left atrial pressure No evidence of left ventricular regional wall motion abnormalities. 2. The right ventricle has normal systolic function. The cavity was mildly enlarged. There is mildly increased right ventricular wall thickness. Right ventricular systolic pressure is moderately elevated. 3. Left atrial size was moderately dilated. 4. Right atrial size was mildly dilated. 5. Small pericardial effusion. 6. The pericardial effusion is circumferential. 7. The MR jet is centrally-directed. 8. The aortic valve is tricuspid. Mild thickening of the aortic valve. Moderate calcification of the aortic valve. Mild-moderate stenosis of the aortic valve. 9. The aorta is not well visualized unless otherwise noted. 10. The inferior vena cava was dilated in size with <50% respiratory variability.  Patient Profile     78 y.o. female  with acute on chronic respiratory failure/hypoxia, longstanding severe COPD, chronic pulmonary artery hypertension, acute on chronic diastolic heart failure moderate aortic stenosis, mitral insufficiency with varying severity, CKD stage IV, DM  Assessment & Plan    Acute on chronic hypoxic respiratory failure: multifactorial in setting of COPD exacerbation and decompensated diastolic heart failure.  Intubated 9/1, extubated 9/4  - CVP 10 off central line this morning, suggesting still mildly volume overloaded - Diuresed well yesterday with stable renal function, would continue IV lasix 40 mg BID today.  Will give an extra dose of IV lasix 40 mg following blood transfusion - Replete for K>4, Mag>2  Moderate Aortic stenosis: MG 26, AVA 1.5, Vmax 3.7 m/s.  Gradients likely exaggerated by hyperdynamic systolic function  Chronic  diastolic HF: net negative 12L on admission -Continue diuresis with IV Lasix 85m BID   COPD: On BiPAP 09/11/2018>>intubated for respiratory failure after failed Bipap therapy -CCMmanaging: on Pulmicort, DuoNeb  AKI on CKD stage IV: Creatinine improving with diuresis (Cr 2.78 ->1.96) -Continue diuresis  HTN: on amlodipine 10 mg, hydralazine 50 mg TID, nitropaste 1 inch TID - Has been on nitropaste, will switch to imdur  DM2: SSI for glucose control while inpatient status  Anemia: Hgb 6.9 this morning.  Labs also notable for significant BUN elevation out of proportion to creatinine, suggesting possible upper GI bleed though no overt bleeding.  Would monitor closely.  Donato Heinz, MD  For questions or updates, please contact   Please consult [email or web address] for contact info under Cardiology/STEMI.

## 2018-09-16 NOTE — Progress Notes (Signed)
CRITICAL VALUE ALERT  Critical Value:  Hgb 6.9  Date & Time Notied:  09/16/2018   Provider Notified: Bodenheimer  Orders Received/Actions taken: 1 unit of blood

## 2018-09-17 ENCOUNTER — Inpatient Hospital Stay (HOSPITAL_COMMUNITY): Payer: Medicare Other

## 2018-09-17 LAB — BASIC METABOLIC PANEL
Anion gap: 15 (ref 5–15)
BUN: 125 mg/dL — ABNORMAL HIGH (ref 8–23)
CO2: 32 mmol/L (ref 22–32)
Calcium: 9.2 mg/dL (ref 8.9–10.3)
Chloride: 97 mmol/L — ABNORMAL LOW (ref 98–111)
Creatinine, Ser: 2.36 mg/dL — ABNORMAL HIGH (ref 0.44–1.00)
GFR calc Af Amer: 22 mL/min — ABNORMAL LOW (ref >60)
GFR calc non Af Amer: 19 mL/min — ABNORMAL LOW (ref >60)
Glucose, Bld: 140 mg/dL — ABNORMAL HIGH (ref 70–99)
Potassium: 4.1 mmol/L (ref 3.5–5.1)
Sodium: 144 mmol/L (ref 135–145)

## 2018-09-17 LAB — CBC
HCT: 31.6 % — ABNORMAL LOW (ref 36.0–46.0)
Hemoglobin: 9.8 g/dL — ABNORMAL LOW (ref 12.0–15.0)
MCH: 28.9 pg (ref 26.0–34.0)
MCHC: 31 g/dL (ref 30.0–36.0)
MCV: 93.2 fL (ref 80.0–100.0)
Platelets: 243 10*3/uL (ref 150–400)
RBC: 3.39 MIL/uL — ABNORMAL LOW (ref 3.87–5.11)
RDW: 17.6 % — ABNORMAL HIGH (ref 11.5–15.5)
WBC: 13.8 10*3/uL — ABNORMAL HIGH (ref 4.0–10.5)
nRBC: 0 % (ref 0.0–0.2)

## 2018-09-17 LAB — TYPE AND SCREEN
ABO/RH(D): B POS
Antibody Screen: NEGATIVE
Unit division: 0

## 2018-09-17 LAB — GLUCOSE, CAPILLARY
Glucose-Capillary: 132 mg/dL — ABNORMAL HIGH (ref 70–99)
Glucose-Capillary: 127 mg/dL — ABNORMAL HIGH (ref 70–99)
Glucose-Capillary: 230 mg/dL — ABNORMAL HIGH (ref 70–99)
Glucose-Capillary: 237 mg/dL — ABNORMAL HIGH (ref 70–99)
Glucose-Capillary: 265 mg/dL — ABNORMAL HIGH (ref 70–99)

## 2018-09-17 LAB — BPAM RBC
Blood Product Expiration Date: 202009082359
ISSUE DATE / TIME: 202009060837
Unit Type and Rh: 7300

## 2018-09-17 MED ORDER — CHLORHEXIDINE GLUCONATE 0.12 % MT SOLN
OROMUCOSAL | Status: AC
  Administered 2018-09-17: 15 mL via OROMUCOSAL
  Filled 2018-09-17: qty 15

## 2018-09-17 MED ORDER — HYDRALAZINE HCL 50 MG PO TABS
50.0000 mg | ORAL_TABLET | Freq: Four times a day (QID) | ORAL | Status: DC
  Administered 2018-09-17 – 2018-09-21 (×17): 50 mg via ORAL
  Filled 2018-09-17 (×17): qty 1

## 2018-09-17 MED ORDER — TORSEMIDE 20 MG PO TABS
20.0000 mg | ORAL_TABLET | Freq: Two times a day (BID) | ORAL | Status: DC
  Administered 2018-09-18 – 2018-09-21 (×7): 20 mg via ORAL
  Filled 2018-09-17 (×8): qty 1

## 2018-09-17 MED ORDER — FUROSEMIDE 10 MG/ML IJ SOLN
40.0000 mg | Freq: Once | INTRAMUSCULAR | Status: AC
  Administered 2018-09-17: 40 mg via INTRAVENOUS
  Filled 2018-09-17: qty 4

## 2018-09-17 NOTE — Progress Notes (Signed)
[facility]  PROGRESS NOTE  [name] YOV:[id number] DOB: [birth year] DOA: 09/03/2018 PCP: Garwin Brothers, MD   Brief HPI:   78 year old female with a history of combined systolic and diastolic CHF, diabetes mellitus type 2, anemia, COPD, pulmonary hypertension came with shortness of breath.  Cardiology was consulted and patient started on IV Lasix.  Patient became hypoxic and was found to have acute respiratory with hypoxia and hypercapnia she was transferred to stepdown, initially placed on BiPAP.  PCCM was consulted and patient was intubated.  Patient got extubated on 09/15/2018 and transferred to floor.  [facility] resumed care on 09/16/2018.    Subjective   Patient seen and examined, breathing has improved.   Assessment/Plan:     1. Acute on chronic hypoxic respiratory failure-in setting of chronic COPD, pulmonary edema, pulmonary hypertension.  Patient was extubated on 09/15/2018.  Breathing has improved.  Currently she is on oxygen via nasal cannula.  Lasix is currently on hold due to worsening renal function.  2. AKI on CKD stage IV-patient baseline creatinine is 2.3-2.5, creatinine today is 2.36.  Lasix on hold per cardiology.  Will follow BMP in a.m.   Avoid nephrotoxins.  3. Hypertension-blood pressure is controlled, continue amlodipine, hydralazine.  4. Diabetes mellitus type 2-CBG fairly well controlled, continue sliding scale insulin with NovoLog.  5. COPD-stable, continue PRN bronchodilators.  6. Pulmonary hypertension-continue supportive care.    7. Hypokalemia-replete  8. Anemia-patient's hemoglobin is 6.9, 1 unit PRBC given this morning.  Repeat hemoglobin is 9.8.  This morning FOBT was positive in July 2020.  She had normal EGD on 07/25/2018.  Colonoscopy was not done as patient was not deemed to be a candidate for colonoscopy.  Follow CBC in a.m., transfuse as needed for hemoglobin less than 7.    CBG: Recent Labs  Lab 09/16/18 1928  09/16/18 2328 09/17/18 0436 09/17/18 0745 09/17/18 1157  GLUCAP 311* 270* 132* 127* 230*    CBC: Recent Labs  Lab 09/13/18 0220 09/14/18 0418 09/15/18 0448 09/16/18 0500 09/16/18 1400 09/17/18 0437  WBC 10.1 13.6* 13.5* 10.3  --  13.8*  HGB 8.1* 8.4* 8.4* 6.9* 9.0* 9.8*  HCT 27.2* 27.3* 28.0* 23.0* 29.0* 31.6*  MCV 95.1 96.5 97.2 96.6  --  93.2  PLT 215 197 215 179  --  741    Basic Metabolic Panel: Recent Labs  Lab 09/12/18 1213 09/12/18 1745 09/13/18 0220 09/13/18 2100 09/14/18 0418 09/15/18 0448 09/16/18 0500 09/17/18 0437  NA  --   --  143  --  143 145 147* 144  K  --   --  4.1  --  4.0 3.8 2.7* 4.1  CL  --   --  100  --  99 97* 105 97*  CO2  --   --  33*  --  35* 35* 29 32  GLUCOSE  --   --  191*  --  209* 118* 121* 140*  BUN  --   --  110*  --  147* 141* 111* 125*  CREATININE  --   --  2.86*  --  2.83* 2.78* 1.96* 2.36*  CALCIUM  --   --  8.6*  --  8.5* 8.9 7.4* 9.2  MG 2.3 2.3 2.3 2.4  --   --  2.0  --   PHOS 3.5 4.2 4.2 4.0  --   --  2.9  --      DVT prophylaxis: Heparin  Code Status: Partial code, no CPR or  defibrillation  Family Communication: No family at bedside  Disposition Plan: likely skilled nursing facility in 1 to 2 days.  Pressure Injury 07/05/18 Sacrum Mid Stage II -  Partial thickness loss of dermis presenting as a shallow open ulcer with a red, pink wound bed without slough. red with granulation tissue (Active)  07/05/18 1519  Location: Sacrum  Location Orientation: Mid  Staging: Stage II -  Partial thickness loss of dermis presenting as a shallow open ulcer with a red, pink wound bed without slough.  Wound Description (Comments): red with granulation tissue  Present on Admission: Yes     Pressure Injury 09/03/18 Heel Right Stage III -  Full thickness tissue loss. Subcutaneous fat may be visible but bone, tendon or muscle are NOT exposed. (Active)  09/03/18 1445  Location: Heel  Location Orientation: Right  Staging: Stage III -   Full thickness tissue loss. Subcutaneous fat may be visible but bone, tendon or muscle are NOT exposed.  Wound Description (Comments):   Present on Admission: Yes     Pressure Injury 09/03/18 Foot Left;Other (Comment);Lateral Unstageable - Full thickness tissue loss in which the base of the ulcer is covered by slough (yellow, tan, gray, green or brown) and/or eschar (tan, brown or black) in the wound bed. (Active)  09/03/18 1445  Location: Foot  Location Orientation: Left;Other (Comment);Lateral (outer)  Staging: Unstageable - Full thickness tissue loss in which the base of the ulcer is covered by slough (yellow, tan, gray, green or brown) and/or eschar (tan, brown or black) in the wound bed.  Wound Description (Comments):   Present on Admission: Yes     Pressure Injury 09/03/18 Hip Anterior;Left Stage II -  Partial thickness loss of dermis presenting as a shallow open ulcer with a red, pink wound bed without slough. pink bed open (Active)  09/03/18 1445  Location: Hip  Location Orientation: Anterior;Left  Staging: Stage II -  Partial thickness loss of dermis presenting as a shallow open ulcer with a red, pink wound bed without slough.  Wound Description (Comments): pink bed open  Present on Admission: Yes      BMI  Estimated body mass index is 30.45 kg/m as calculated from the following:   Height as of this encounter: '5\' 5"'  (1.651 m).   Weight as of this encounter: 83 kg.  Scheduled medications:  . amLODipine  10 mg Oral Daily  . budesonide (PULMICORT) nebulizer solution  0.5 mg Nebulization BID  . chlorhexidine gluconate (MEDLINE KIT)  15 mL Mouth Rinse BID  . Chlorhexidine Gluconate Cloth  6 each Topical Daily  . docusate sodium  100 mg Oral Daily  . folic acid  0.5 mg Oral Daily  . heparin  5,000 Units Subcutaneous Q8H  . hydrALAZINE  50 mg Oral Q6H  . insulin aspart  0-15 Units Subcutaneous Q4H  . ipratropium-albuterol  3 mL Nebulization TID  . iron polysaccharides   150 mg Oral BID  . isosorbide mononitrate  30 mg Oral Daily  . montelukast  10 mg Oral QHS  . multivitamin  1 tablet Oral Daily  . omega-3 acid ethyl esters  1 g Oral Daily  . pantoprazole  40 mg Oral Daily  . pyridOXINE  100 mg Oral Daily  . senna  1 tablet Oral BID  . simvastatin  20 mg Oral Daily  . [START ON 09/18/2018] torsemide  20 mg Oral BID  . traZODone  50 mg Oral QHS  . vitamin B-12  1,000 mcg Oral  Daily  . vitamin E  400 Units Oral Daily    Consultants:  PCCM  Cardiology  Procedures:  ETT 9/1 to 9/5  Echocardiogram   Antibiotics:   Anti-infectives (From admission, onward)   None       Objective   Vitals:   09/17/18 0500 09/17/18 0546 09/17/18 0546 09/17/18 0825  BP:  (!) 173/70 (!) 173/70   Pulse:   74   Resp:   (!) 22   Temp:   97.8 F (36.6 C)   TempSrc:   Oral   SpO2:   96% 95%  Weight: 83 kg     Height:        Intake/Output Summary (Last 24 hours) at 09/17/2018 1526 Last data filed at 09/17/2018 1205 Gross per 24 hour  Intake 720 ml  Output 1850 ml  Net -1130 ml   Filed Weights   09/14/18 0500 09/16/18 0500 09/17/18 0500  Weight: 84.8 kg 81.7 kg 83 kg     Physical Examination:   General-appears in no acute distress Heart-S1-S2, regular, no murmur auscultated Lungs-clear to auscultation bilaterally, no wheezing or crackles auscultated Abdomen-soft, nontender, no organomegaly Extremities-no edema in the lower extremities Neuro-alert, oriented x3, no focal deficit noted    Data Reviewed: I have personally reviewed following labs and imaging studies   Recent Results (from the past 240 hour(s))  SARS Coronavirus 2 [facility] order, Performed in [place] hospital lab) Nasopharyngeal Nasopharyngeal Swab     Status: None   Collection Time: 09/10/18 10:43 AM   Specimen: Nasopharyngeal Swab  Result Value Ref Range Status   SARS Coronavirus 2 NEGATIVE NEGATIVE Final    Comment: (NOTE) If result is NEGATIVE SARS-CoV-2 target  nucleic acids are NOT DETECTED. The SARS-CoV-2 RNA is generally detectable in upper and lower  respiratory specimens during the acute phase of infection. The lowest  concentration of SARS-CoV-2 viral copies this assay can detect is 250  copies / mL. A negative result does not preclude SARS-CoV-2 infection  and should not be used as the sole basis for treatment or other  patient management decisions.  A negative result may occur with  improper specimen collection / handling, submission of specimen other  than nasopharyngeal swab, presence of viral mutation(s) within the  areas targeted by this assay, and inadequate number of viral copies  (<250 copies / mL). A negative result must be combined with clinical  observations, patient history, and epidemiological information. If result is POSITIVE SARS-CoV-2 target nucleic acids are DETECTED. The SARS-CoV-2 RNA is generally detectable in upper and lower  respiratory specimens dur ing the acute phase of infection.  Positive  results are indicative of active infection with SARS-CoV-2.  Clinical  correlation with patient history and other diagnostic information is  necessary to determine patient infection status.  Positive results do  not rule out bacterial infection or co-infection with other viruses. If result is PRESUMPTIVE POSTIVE SARS-CoV-2 nucleic acids MAY BE PRESENT.   A presumptive positive result was obtained on the submitted specimen  and confirmed on repeat testing.  While 2019 novel coronavirus  (SARS-CoV-2) nucleic acids may be present in the submitted sample  additional confirmatory testing may be necessary for epidemiological  and / or clinical management purposes  to differentiate between  SARS-CoV-2 and other Sarbecovirus currently known to infect humans.  If clinically indicated additional testing with an alternate test  methodology [phone]) is advised. The SARS-CoV-2 RNA is generally  detectable in upper and lower  respiratory sp ecimens  during the acute  phase of infection. The expected result is Negative. Fact Sheet for Patients:  StrictlyIdeas.no Fact Sheet for Healthcare Providers: BankingDealers.co.za This test is not yet approved or cleared by the [place] FDA and has been authorized for detection and/or diagnosis of SARS-CoV-2 by FDA under an Emergency Use Authorization (EUA).  This EUA will remain in effect (meaning this test can be used) for the duration of the COVID-19 declaration under Section 564(b)(1) of the Act, 21 U.S.C. section 360bbb-3(b)(1), unless the authorization is terminated or revoked sooner. Performed at [facility], [place] [address] [place], [place] [address]      Liver Function Tests: Recent Labs  Lab 09/13/18 0220 09/14/18 0418  AST 13* 10*  ALT 13 13  ALKPHOS 45 46  BILITOT 0.5 0.4  PROT 6.5 6.8  ALBUMIN 2.8* 2.8*    BNP (last 3 results) Recent Labs    07/23/18 1459 09/01/18 1538 09/03/18 0429  BNP 466.8* 512.4* 696.0*      Admission status: Inpatient: Based on patients clinical presentation and evaluation of above clinical data, I have made determination that patient meets Inpatient criteria at this time.  Time spent: 20 min  [facility] Pager [phone]. If 7PM-7AM, please contact night-coverage at [email or web address], Office  [phone]  password [place]  09/17/2018, 3:26 PM  LOS: 14 days

## 2018-09-17 NOTE — Plan of Care (Signed)

## 2018-09-17 NOTE — Progress Notes (Addendum)
Progress Note  Patient Name: [name] [facility] Date of Encounter: 09/17/2018  Primary Cardiologist: Buford Dresser, MD  Subjective   Net negative 1.1L on IV lasix 40 mg x3 yesterday, with worsening renal function (1.96->2.36).  She reports dyspnea is improved.  Denies any chest pain.  Inpatient Medications    Scheduled Meds: . amLODipine  10 mg Oral Daily  . budesonide (PULMICORT) nebulizer solution  0.5 mg Nebulization BID  . chlorhexidine gluconate (MEDLINE KIT)  15 mL Mouth Rinse BID  . Chlorhexidine Gluconate Cloth  6 each Topical Daily  . docusate sodium  100 mg Oral Daily  . folic acid  0.5 mg Oral Daily  . heparin  5,000 Units Subcutaneous Q8H  . hydrALAZINE  50 mg Oral Q8H  . insulin aspart  0-15 Units Subcutaneous Q4H  . ipratropium-albuterol  3 mL Nebulization TID  . iron polysaccharides  150 mg Oral BID  . isosorbide mononitrate  30 mg Oral Daily  . montelukast  10 mg Oral QHS  . multivitamin  1 tablet Oral Daily  . omega-3 acid ethyl esters  1 g Oral Daily  . pantoprazole  40 mg Oral Daily  . pyridOXINE  100 mg Oral Daily  . senna  1 tablet Oral BID  . simvastatin  20 mg Oral Daily  . traZODone  50 mg Oral QHS  . vitamin B-12  1,000 mcg Oral Daily  . vitamin E  400 Units Oral Daily   Continuous Infusions:  PRN Meds: acetaminophen **OR** acetaminophen, alum & mag hydroxide-simeth, cyclobenzaprine, hydrALAZINE, oxyCODONE-acetaminophen   Vital Signs    Vitals:   09/16/18 2321 09/17/18 0500 09/17/18 0546 09/17/18 0546  BP: (!) 165/76  (!) 173/70 (!) 173/70  Pulse: 81   74  Resp: 20   (!) 22  Temp: 97.9 F (36.6 C)   97.8 F (36.6 C)  TempSrc: Oral   Oral  SpO2: 94%   96%  Weight:  83 kg    Height:        Intake/Output Summary (Last 24 hours) at 09/17/2018 0823 Last data filed at 09/16/2018 2300 Gross per 24 hour  Intake 712.5 ml  Output 1850 ml  Net -1137.5 ml   Filed Weights   09/14/18 0500 09/16/18 0500 09/17/18 0500  Weight: 84.8 kg  81.7 kg 83 kg    Physical Exam   GEN:No acute distress.   Neck:+ JVD Cardiac:RRR, 3/6 systolic murmur loudest at RUSB Respiratory:Scattered expiratory wheezing.  No crackles TO:IZTI, nontender, non-distended  WP:YKDXI edema Neuro:Nonfocal  Psych: Normal affect   Labs    Chemistry Recent Labs  Lab 09/13/18 0220 09/14/18 0418 09/15/18 0448 09/16/18 0500 09/17/18 0437  NA 143 143 145 147* 144  K 4.1 4.0 3.8 2.7* 4.1  CL 100 99 97* 105 97*  CO2 33* 35* 35* 29 32  GLUCOSE 191* 209* 118* 121* 140*  BUN 110* 147* 141* 111* PENDING  CREATININE 2.86* 2.83* 2.78* 1.96* 2.36*  CALCIUM 8.6* 8.5* 8.9 7.4* 9.2  PROT 6.5 6.8  --   --   --   ALBUMIN 2.8* 2.8*  --   --   --   AST 13* 10*  --   --   --   ALT 13 13  --   --   --   ALKPHOS 45 46  --   --   --   BILITOT 0.5 0.4  --   --   --   GFRNONAA 15* 16* 16* 24*  19*  GFRAA 18* 18* 18* 28* 22*  ANIONGAP '10 9 13 13 15     ' Hematology Recent Labs  Lab 09/15/18 0448 09/16/18 0500 09/16/18 1400 09/17/18 0437  WBC 13.5* 10.3  --  13.8*  RBC 2.88* 2.38*  --  3.39*  HGB 8.4* 6.9* 9.0* 9.8*  HCT 28.0* 23.0* 29.0* 31.6*  MCV 97.2 96.6  --  93.2  MCH 29.2 29.0  --  28.9  MCHC 30.0 30.0  --  31.0  RDW 17.8* 17.5*  --  17.6*  PLT 215 179  --  243    Cardiac EnzymesNo results for input(s): TROPONINI in the last 168 hours. No results for input(s): TROPIPOC in the last 168 hours.   BNPNo results for input(s): BNP, PROBNP in the last 168 hours.   DDimer No results for input(s): DDIMER in the last 168 hours.   Radiology    No results found. Telemetry    NSR with rate 70-80s - Personally Reviewed  ECG    No new tracing - Personally Reviewed  Cardiac Studies   Echocardiogram 09/11/2018:  1. The left ventricle has hyperdynamic systolic function, with an ejection fraction of >65%. The cavity size was normal. There is moderate concentric left ventricular hypertrophy. Left ventricular diastolic Doppler parameters are  consistent with  pseudonormalization. Elevated mean left atrial pressure No evidence of left ventricular regional wall motion abnormalities. 2. The right ventricle has normal systolic function. The cavity was mildly enlarged. There is mildly increased right ventricular wall thickness. Right ventricular systolic pressure is moderately elevated. 3. Left atrial size was moderately dilated. 4. Right atrial size was mildly dilated. 5. Small pericardial effusion. 6. The pericardial effusion is circumferential. 7. The MR jet is centrally-directed. 8. The aortic valve is tricuspid. Mild thickening of the aortic valve. Moderate calcification of the aortic valve. Mild-moderate stenosis of the aortic valve. 9. The aorta is not well visualized unless otherwise noted. 10. The inferior vena cava was dilated in size with <50% respiratory variability.  Patient Profile     78 y.o. female  with acute on chronic respiratory failure/hypoxia, longstanding severe COPD, chronic pulmonary artery hypertension, acute on chronic diastolic heart failure moderate aortic stenosis, mitral insufficiency with varying severity, CKD stage IV, DM  Assessment & Plan    Acute on chronic hypoxic respiratory failure: multifactorial in setting of COPD exacerbation and decompensated diastolic heart failure.  Intubated 9/1, extubated 9/4  - Suspect she is nearly euvolemic as CVP was 10 yesterday prior to diuresis and had bump in Cr this morning.  Received IV lasix 40 mg this morning, will hold further lasix for today and restart home torsemide 20 mg BID tomorrow  Moderate Aortic stenosis: MG 26, AVA 1.5, Vmax 3.7 m/s.  Gradients likely exaggerated by hyperdynamic systolic function  Chronic diastolic HF: net negative 13L on admission -Hold further diuresis for today and restart PO torsemide tomorrow  COPD:  on Pulmicort, DuoNeb  AKI on CKD stage IV: Creatinine improved to 1.96 with diuresis, with worsening renal  function today (2.36), will back off diuresis as above  HTN: on amlodipine 10 mg, hydralazine 50 mg TID, imdur 30 mg daily - BP has been elevated, will increase hydralazine to 50 mg q6hr  DM2: SSI for glucose control while inpatient status  Anemia: Hgb 6.9 yesterday, s/p 1 unit PRBCs.  Hgb 9.8 today  Donato Heinz, MD  For questions or updates, please contact   Please consult [email or web address] for contact info under Cardiology/STEMI.

## 2018-09-17 NOTE — Evaluation (Signed)
Occupational Therapy Evaluation Patient Details Name: [name] MRN: [id number] DOB: 10-Jan-1941 Today's Date: 09/17/2018    History of Present Illness 78 yo female admitted from [facility] with shortness of breath, CXR reveals pulmonary edema consistent with heart failure. 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 Pt required mechanincal ventilation 9/1-9/4.   Clinical Impression   Pt admitted with CHF. Pt currently with functional limitations due to the deficits listed below (see OT Problem List).  Pt will benefit from skilled OT to increase their safety and independence with ADL and functional mobility for ADL to facilitate discharge to venue listed below.       Follow Up Recommendations  SNF    Equipment Recommendations  None recommended by OT    Recommendations for Other Services       Precautions / Restrictions Precautions Precautions: Fall Precaution Comments: monitor sats      Mobility Bed Mobility Overal bed mobility: Needs Assistance Bed Mobility: Supine to Sit;Sit to Supine     Supine to sit: HOB elevated;Min assist Sit to supine: Mod assist   General bed mobility comments: increased time but pt wanted to do it her self! she is motivAted  Warehouse manager transfer comment: did not perform standing- only sitting  EOB this OT session    Balance Overall balance assessment: Needs assistance Sitting-balance support: No upper extremity supported;Feet unsupported Sitting balance-Leahy Scale: Fair Sitting balance - Comments: able to sit unsupported                                   ADL either performed or assessed with clinical judgement   ADL Overall ADL's : Needs assistance/impaired Eating/Feeding: Minimal assistance;Sitting   Grooming: Minimal assistance;Sitting   Upper Body Bathing: Minimal assistance;Sitting    Lower Body Bathing: Maximal assistance;Sitting/lateral leans;Cueing for safety;Cueing for sequencing;Cueing for compensatory techniques   Upper Body Dressing : Minimal assistance;Sitting   Lower Body Dressing: Maximal assistance;Sitting/lateral leans;Cueing for safety;Cueing for compensatory techniques       Toileting- Clothing Manipulation and Hygiene: Cueing for safety;Cueing for compensatory techniques;Cueing for sequencing;Sitting/lateral lean         General ADL Comments: pt sat EOB with OT and performed grooming activity.  Pt plans to goback to SNF for rehab     Vision Patient Visual Report: No change from baseline              Pertinent Vitals/Pain Pain Assessment: No/denies pain     Hand Dominance Right   Extremity/Trunk Assessment         Cervical / Trunk Assessment Cervical / Trunk Assessment: Normal   Communication Communication Communication: No difficulties;Other (comment)(shortness of breath limiting fluidity of speech)   Cognition Arousal/Alertness: Awake/alert Behavior During Therapy: WFL for tasks assessed/performed Overall Cognitive Status: Within Functional Limits for tasks assessed                                                Home Living Family/patient expects to be discharged to:: Skilled nursing facility  Additional Comments: chronically O2 dependent (2L)      Prior Functioning/Environment Level of Independence: Needs assistance  Gait / Transfers Assistance Needed: Pt reports she has been working with PT on transfers, short distance ambulation. Pt states she has not been making good progress because of medical complications that keep bringing her to the hospital. ADL's / Homemaking Assistance Needed: Pt reports assist for meals, dressing, bathing, and transfers            OT Problem List: Decreased strength;Decreased activity tolerance;Impaired balance (sitting  and/or standing);Decreased safety awareness;Decreased knowledge of use of DME or AE      OT Treatment/Interventions: Self-care/ADL training;Patient/family education;DME and/or AE instruction    OT Goals(Current goals can be found in the care plan section) Acute Rehab OT Goals Patient Stated Goal: get stronger, walk  OT Frequency: Min 2X/week    AM-PAC OT "6 Clicks" Daily Activity     Outcome Measure Help from another person eating meals?: A Little Help from another person taking care of personal grooming?: A Little Help from another person toileting, which includes using toliet, bedpan, or urinal?: Total Help from another person bathing (including washing, rinsing, drying)?: A Lot Help from another person to put on and taking off regular upper body clothing?: A Little Help from another person to put on and taking off regular lower body clothing?: Total 6 Click Score: 13   End of Session Equipment Utilized During Treatment: Oxygen Nurse Communication: Mobility status  Activity Tolerance: Patient tolerated treatment well Patient left: in bed;with call bell/phone within reach;with bed alarm set;with family/visitor present  OT Visit Diagnosis: Unsteadiness on feet (R26.81);Other abnormalities of gait and mobility (R26.89);Muscle weakness (generalized) (M62.81);History of falling (Z91.81)                Time: 6767-2094 OT Time Calculation (min): 19 min Charges:  OT General Charges $OT Visit: 1 Visit OT Evaluation $OT Eval Moderate Complexity: 1 Mod  Kari Baars, OT Acute Rehabilitation Services [phone] Office- [phone], Edwena Felty D 09/17/2018, 12:44 PM

## 2018-09-18 LAB — GLUCOSE, CAPILLARY
Glucose-Capillary: 126 mg/dL — ABNORMAL HIGH (ref 70–99)
Glucose-Capillary: 140 mg/dL — ABNORMAL HIGH (ref 70–99)
Glucose-Capillary: 228 mg/dL — ABNORMAL HIGH (ref 70–99)
Glucose-Capillary: 229 mg/dL — ABNORMAL HIGH (ref 70–99)
Glucose-Capillary: 236 mg/dL — ABNORMAL HIGH (ref 70–99)
Glucose-Capillary: 300 mg/dL — ABNORMAL HIGH (ref 70–99)

## 2018-09-18 LAB — BRAIN NATRIURETIC PEPTIDE
B Natriuretic Peptide: 551.8 pg/mL — ABNORMAL HIGH (ref 0.0–100.0)
B Natriuretic Peptide: 441.5 pg/mL — ABNORMAL HIGH (ref 0.0–100.0)

## 2018-09-18 LAB — CBC
HCT: 31.9 % — ABNORMAL LOW (ref 36.0–46.0)
Hemoglobin: 9.7 g/dL — ABNORMAL LOW (ref 12.0–15.0)
MCH: 28.3 pg (ref 26.0–34.0)
MCHC: 30.4 g/dL (ref 30.0–36.0)
MCV: 93 fL (ref 80.0–100.0)
Platelets: 243 10*3/uL (ref 150–400)
RBC: 3.43 MIL/uL — ABNORMAL LOW (ref 3.87–5.11)
RDW: 17.2 % — ABNORMAL HIGH (ref 11.5–15.5)
WBC: 11.3 10*3/uL — ABNORMAL HIGH (ref 4.0–10.5)
nRBC: 0 % (ref 0.0–0.2)

## 2018-09-18 LAB — BASIC METABOLIC PANEL
Anion gap: 14 (ref 5–15)
BUN: 115 mg/dL — ABNORMAL HIGH (ref 8–23)
CO2: 31 mmol/L (ref 22–32)
Calcium: 9 mg/dL (ref 8.9–10.3)
Chloride: 97 mmol/L — ABNORMAL LOW (ref 98–111)
Creatinine, Ser: 2.37 mg/dL — ABNORMAL HIGH (ref 0.44–1.00)
GFR calc Af Amer: 22 mL/min — ABNORMAL LOW (ref >60)
GFR calc non Af Amer: 19 mL/min — ABNORMAL LOW (ref >60)
Glucose, Bld: 118 mg/dL — ABNORMAL HIGH (ref 70–99)
Potassium: 4 mmol/L (ref 3.5–5.1)
Sodium: 142 mmol/L (ref 135–145)

## 2018-09-18 MED ORDER — JUVEN PO PACK
1.0000 | PACK | Freq: Two times a day (BID) | ORAL | Status: DC
  Administered 2018-09-18 – 2018-09-20 (×6): 1 via ORAL
  Filled 2018-09-18 (×8): qty 1

## 2018-09-18 MED ORDER — PRO-STAT SUGAR FREE PO LIQD
30.0000 mL | Freq: Every day | ORAL | Status: DC
  Administered 2018-09-18 – 2018-09-21 (×4): 30 mL via ORAL
  Filled 2018-09-18 (×3): qty 30

## 2018-09-18 MED ORDER — ENSURE ENLIVE PO LIQD
237.0000 mL | Freq: Two times a day (BID) | ORAL | Status: DC
  Administered 2018-09-18 – 2018-09-21 (×5): 237 mL via ORAL

## 2018-09-18 NOTE — Progress Notes (Signed)
   09/17/18 2111  MEWS Score  Resp (!) 60  Pulse Rate 77  BP (!) 154/64  Temp 99 F (37.2 C)  SpO2 94 %  O2 Device Nasal Cannula  O2 Flow Rate (L/min) 2 L/min  MEWS Score  MEWS RR 3  MEWS Pulse 0  MEWS Systolic 0  MEWS LOC 0  MEWS Temp 0  MEWS Score 3  MEWS Score Color Yellow  MEWS Assessment  Is this an acute change? Yes  Rapid Response Notification  Name of Rapid Response RN Notified yes  Date Rapid Response Notified 09/17/18  Time Rapid Response Notified 2131  Provider Notification  Provider Name/Title Baltazar Najjar, NP  Date Provider Notified 09/17/18  Time Provider Notified 2131  Notification Type Page  Notification Reason Other (Comment) (Rep of 60)  Response See new orders  Date of Provider Response 09/17/18

## 2018-09-18 NOTE — Progress Notes (Signed)
Physical Therapy Treatment Patient Details Name: [name] MRN: [id number] DOB: 1940-03-06 Today's Date: 09/18/2018    History of Present Illness 78 yo female admitted from [place] with shortness of breath, CXR reveals pulmonary edema consistent with heart failure. 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 Pt required mechanincal ventilation 9/1-9/4.    PT Comments    Pt assisted to sitting EOB and attempted to stand pivot to [facility] however pt unable due to weakness and reported bil foot pain.  Pt slow to mobilize however attempts to self assist as much as possible.  Pt assisted back to supine and with rolling to change sheets and perform pericare with assist from nurse tech.  Pt also required a few rest breaks due to fatigue and SOB however improved with cues for breathing. Continue to recommend SNF upon d/c.   Follow Up Recommendations  SNF;Supervision/Assistance - 24 hour     Equipment Recommendations  None recommended by PT    Recommendations for Other Services       Precautions / Restrictions Precautions Precautions: Fall Precaution Comments: monitor sats    Mobility  Bed Mobility Overal bed mobility: Needs Assistance Bed Mobility: Supine to Sit;Sit to Supine     Supine to sit: Mod assist Sit to supine: Mod assist   General bed mobility comments: increased time and effort, assist for scooting out to EOB, pt utilized bed rail to self assist, pt fatigued end of session and required assist for LEs onto bed  Transfers Overall transfer level: Needs assistance Equipment used: Rolling walker (2 wheeled) Transfers: Sit to/from Stand Sit to Stand: Total assist;+2 physical assistance         General transfer comment: attempted however pt unable, requiring increased assist and reports pain in her feet (plan was to try to get over to [facility] and pt attempted x3 however  unable)  Ambulation/Gait                 Stairs             Wheelchair Mobility    Modified Rankin (Stroke Patients Only)       Balance Overall balance assessment: Needs assistance Sitting-balance support: No upper extremity supported;Feet unsupported Sitting balance-Leahy Scale: Fair                                      Cognition Arousal/Alertness: Awake/alert Behavior During Therapy: WFL for tasks assessed/performed Overall Cognitive Status: Within Functional Limits for tasks assessed                                        Exercises      General Comments General comments (skin integrity, edema, etc.): Pt remained on 2L O2 [place]      Pertinent Vitals/Pain Pain Assessment: Faces Faces Pain Scale: Hurts even more Pain Location: bil feet Pain Descriptors / Indicators: Sore;Discomfort;Tender Pain Intervention(s): Monitored during session;Repositioned    Home Living                      Prior Function            PT Goals (current goals can now be found in the care plan section) Progress towards PT goals: Progressing toward goals  Frequency    Min 2X/week      PT Plan Current plan remains appropriate    Co-evaluation              AM-PAC PT "6 Clicks" Mobility   Outcome Measure  Help needed turning from your back to your side while in a flat bed without using bedrails?: A Little Help needed moving from lying on your back to sitting on the side of a flat bed without using bedrails?: A Lot Help needed moving to and from a bed to a chair (including a wheelchair)?: Total Help needed standing up from a chair using your arms (e.g., wheelchair or bedside chair)?: Total Help needed to walk in hospital room?: Total Help needed climbing 3-5 steps with a railing? : Total 6 Click Score: 9    End of Session Equipment Utilized During Treatment: Oxygen;Gait belt Activity Tolerance: Patient limited by  fatigue Patient left: in bed;with call bell/phone within reach;with bed alarm set Nurse Communication: Mobility status PT Visit Diagnosis: Muscle weakness (generalized) (M62.81)     Time: 8616-8372 PT Time Calculation (min) (ACUTE ONLY): 54 min  Charges:  $Therapeutic Activity: 38-52 mins                    Carmelia Bake, PT, DPT Acute Rehabilitation Services Office: [phone] Pager: [phone]   Trena Platt 09/18/2018, 1:37 PM

## 2018-09-18 NOTE — Progress Notes (Signed)
Initial Nutrition Assessment  DOCUMENTATION CODES:   Obesity unspecified  INTERVENTION:  - will order Ensure Enlive BID, each supplement provides 350 kcal and 20 grams of protein.  - will order Juven BID, each packet provides 95 calories, 2.5 grams of protein (collagen), and 9.8 grams of carbohydrate (3 grams sugar); also contains 7 grams of L-arginine and L-glutamine, 300 mg vitamin C, 15 mg vitamin E, 1.2 mcg vitamin B-12, 9.5 mg zinc, 200 mg calcium, and 1.5 g  Calcium Beta-hydroxy-Beta-methylbutyrate to support wound healing.  - will order 30 mL Prostat once/day, each supplement provides 100 kcal and 15 grams of protein.  - continue to encourage PO intakes.    NUTRITION DIAGNOSIS:   Increased nutrient needs related to wound healing as evidenced by estimated needs. -ongoing  GOAL:   Patient will meet greater than or equal to 90% of their needs -unmet, improving  MONITOR:   PO intake, Supplement acceptance, Labs, Weight trends, Skin  ASSESSMENT:   [name] with medical hx of CHF (recent 2D echo with normal EF), type 2 DM, COPD with chronic respiratory failure on nocturnal home O2, anemia, OSA, PUD, HTN, and hyperlipidemia. She presented to the ED on 8/24 d/t SOB for several days. She was noted to be hypoxic with O2 sat of 88% on 4L nasal cannula. EMS noted patient had wheezing and rales and treated with epinephrine and nitroglycerin. She was found tachypneic and hypoxic with acute on CHF with CXR showed pulmonary vascular congestion. Also noted for acute on CKD and hyperkalemia. Given IV Lasix 80 mg x 1 and admitted to hospital service.  Weight is the lowest it has been since admission (8/24). Patient was extubated and OGT removed on 9/4 at 1110. Diet advanced from NPO to Heart Healthy on 9/5 at 1418. Per flow sheet, she ate 100% of breakfast and 50% of lunch and dinner yesterday (total of 1209 kcal, 58 grams protein). Plan is for likely SNF at d/c.     Labs reviewed; CBGs: 236, 126, 140, and 229 mg/dl, Cl: 97 mmol/l, BUN: 115 mg/dl, creatinine: 2.37 mg/dl, GFR: 22 ml/min.  Medications reviewed; 100 mg colace/day, 0.5 mg folvite/day, 40 mg IV lasix x1 dose 9/7, sliding scale novolog, 1 tablet prosignt/day, 1 g lovaza/day, 100 mg vitamin B6/day, 1 tablet senokot BID, 1000 mcg oral cyanocobalamin/day, 400 units vitamin E/day.     Diet Order:   Diet Order            Diet Heart Room service appropriate? Yes; Fluid consistency: Thin  Diet effective now              EDUCATION NEEDS:   Not appropriate for education at this time  Skin:  Skin Assessment: Skin Integrity Issues: Skin Integrity Issues:: Stage II, Stage III, Unstageable Stage II: L hip and mid sacrum Stage III: R heel Unstageable: L foot and L heel  Last BM:  9/8  Height:   Ht Readings from Last 1 Encounters:  09/05/18 5\' 5"  (1.651 m)    Weight:   Wt Readings from Last 1 Encounters:  09/18/18 78.6 kg    Ideal Body Weight:  56.8 kg  BMI:  Body mass index is 28.84 kg/m.  Estimated Nutritional Needs:   Kcal:  2275-2450 kcal  Protein:  125-135 grams  Fluid:  >/= 2.2 L/day     Jarome Matin, MS, RD, LDN, [facility] Inpatient Clinical Dietitian Pager # [phone] After hours/weekend pager # [phone]

## 2018-09-18 NOTE — Progress Notes (Addendum)
Progress Note  Patient Name: [name] [facility] Date of Encounter: 09/18/2018  Primary Cardiologist: Buford Dresser, MD   Subjective   Reports improved breathing after treatment this morning. No chest pain.   Inpatient Medications    Scheduled Meds: . amLODipine  10 mg Oral Daily  . budesonide (PULMICORT) nebulizer solution  0.5 mg Nebulization BID  . chlorhexidine gluconate (MEDLINE KIT)  15 mL Mouth Rinse BID  . docusate sodium  100 mg Oral Daily  . folic acid  0.5 mg Oral Daily  . heparin  5,000 Units Subcutaneous Q8H  . hydrALAZINE  50 mg Oral Q6H  . insulin aspart  0-15 Units Subcutaneous Q4H  . ipratropium-albuterol  3 mL Nebulization TID  . iron polysaccharides  150 mg Oral BID  . isosorbide mononitrate  30 mg Oral Daily  . montelukast  10 mg Oral QHS  . multivitamin  1 tablet Oral Daily  . omega-3 acid ethyl esters  1 g Oral Daily  . pantoprazole  40 mg Oral Daily  . pyridOXINE  100 mg Oral Daily  . senna  1 tablet Oral BID  . simvastatin  20 mg Oral Daily  . torsemide  20 mg Oral BID  . traZODone  50 mg Oral QHS  . vitamin B-12  1,000 mcg Oral Daily  . vitamin E  400 Units Oral Daily   Continuous Infusions:  PRN Meds: acetaminophen **OR** acetaminophen, alum & mag hydroxide-simeth, cyclobenzaprine, hydrALAZINE, oxyCODONE-acetaminophen   Vital Signs    Vitals:   09/18/18 0210 09/18/18 0512 09/18/18 0536 09/18/18 0708  BP: (!) 157/76 (!) 158/79    Pulse: 78 77 86   Resp: (!) 44 (!) 37    Temp: 98.8 F (37.1 C) 98.9 F (37.2 C)    TempSrc: Oral Oral    SpO2: 91% (!) 84% 100%   Weight:    78.6 kg  Height:        Intake/Output Summary (Last 24 hours) at 09/18/2018 0838 Last data filed at 09/18/2018 0500 Gross per 24 hour  Intake 960 ml  Output 2300 ml  Net -1340 ml   Last 3 Weights 09/18/2018 09/17/2018 09/16/2018  Weight (lbs) 173 lb 4.5 oz 183 lb 180 lb 1.9 oz  Weight (kg) 78.6 kg 83.008 kg 81.7 kg      Telemetry    SR with PVCs and rare PACs -  Personally Reviewed  ECG    No new tracing   Physical Exam   GEN: ill appearing elderly female in no acute distress.   Neck: + JVD Cardiac: RRR, 3/6 systolic murmurs, rubs, or gallops.  Respiratory: diffuse wheezing  GI: Soft, nontender, non-distended  MS: trace edema; No deformity. Neuro:  Nonfocal  Psych: Normal affect   Labs    High Sensitivity Troponin:   Recent Labs  Lab 09/03/18 0345 09/03/18 0600 09/10/18 2232 09/11/18 0030  TROPONINIHS 75* 72* 43* 40*      Chemistry Recent Labs  Lab 09/13/18 0220 09/14/18 0418  09/16/18 0500 09/17/18 0437 09/18/18 0441  NA 143 143   < > 147* 144 142  K 4.1 4.0   < > 2.7* 4.1 4.0  CL 100 99   < > 105 97* 97*  CO2 33* 35*   < > 29 32 31  GLUCOSE 191* 209*   < > 121* 140* 118*  BUN 110* 147*   < > 111* 125* 115*  CREATININE 2.86* 2.83*   < > 1.96* 2.36* 2.37*  CALCIUM 8.6*  8.5*   < > 7.4* 9.2 9.0  PROT 6.5 6.8  --   --   --   --   ALBUMIN 2.8* 2.8*  --   --   --   --   AST 13* 10*  --   --   --   --   ALT 13 13  --   --   --   --   ALKPHOS 45 46  --   --   --   --   BILITOT 0.5 0.4  --   --   --   --   GFRNONAA 15* 16*   < > 24* 19* 19*  GFRAA 18* 18*   < > 28* 22* 22*  ANIONGAP 10 9   < > '13 15 14   ' < > = values in this interval not displayed.     Hematology Recent Labs  Lab 09/16/18 0500 09/16/18 1400 09/17/18 0437 09/18/18 0441  WBC 10.3  --  13.8* 11.3*  RBC 2.38*  --  3.39* 3.43*  HGB 6.9* 9.0* 9.8* 9.7*  HCT 23.0* 29.0* 31.6* 31.9*  MCV 96.6  --  93.2 93.0  MCH 29.0  --  28.9 28.3  MCHC 30.0  --  31.0 30.4  RDW 17.5*  --  17.6* 17.2*  PLT 179  --  243 243    BNP Recent Labs  Lab 09/18/18 0320 09/18/18 0441  BNP 551.8* 441.5*     Radiology    Dg Chest Port 1 View  Result Date: 09/17/2018 CLINICAL DATA:  Tachypnea EXAM: PORTABLE CHEST 1 VIEW COMPARISON:  09/13/2018 FINDINGS: Cardiomegaly with vascular congestion and probable mild pulmonary edema, slightly improved since prior study. No  effusions or acute bony abnormality. IMPRESSION: Cardiomegaly, vascular congestion and mild pulmonary edema, improved since prior study. Electronically Signed   By: Rolm Baptise M.D.   On: 09/17/2018 21:56    Cardiac Studies    Echocardiogram 09/11/2018:  1. The left ventricle has hyperdynamic systolic function, with an ejection fraction of >65%. The cavity size was normal. There is moderate concentric left ventricular hypertrophy. Left ventricular diastolic Doppler parameters are consistent with  pseudonormalization. Elevated mean left atrial pressure No evidence of left ventricular regional wall motion abnormalities. 2. The right ventricle has normal systolic function. The cavity was mildly enlarged. There is mildly increased right ventricular wall thickness. Right ventricular systolic pressure is moderately elevated. 3. Left atrial size was moderately dilated. 4. Right atrial size was mildly dilated. 5. Small pericardial effusion. 6. The pericardial effusion is circumferential. 7. The MR jet is centrally-directed. 8. The aortic valve is tricuspid. Mild thickening of the aortic valve. Moderate calcification of the aortic valve. Mild-moderate stenosis of the aortic valve. 9. The aorta is not well visualized unless otherwise noted. 10. The inferior vena cava was dilated in size with <50% respiratory variability.  Patient Profile     [name] is a 78 y.o. female with a history of chronic diastolic CHF with EF >48%, COPD and asthma with chronic respiratory failure on home O2, obstructive sleep apnea not compliant with CPAP,  hypertension, hyperlipidemia, type 2 diabetes mellitus on insulin, CKD stage IV, GERD, PUD, chronic anemia, aortic stensis and depression admitted for acute on chronic respiratory failure/hypoxia in setting of COPD exacerbation and acute dCHF.   Assessment & Plan    1. Acuate on chronic diastolic CHF - Net I & O negative 13L so far with 1.3 L yesterday.  Weight has been  fluctuating, however seems 14lb down. Renal function improved to 1.96 on 9/6 with IV diuresis with CVP of 10, then worsen 2.36 yesterday> held lasix >>2.37 today with BNP of 441. -Restarted home torsemide 72m BID today. Follow renal function closely.   2. Acute on CKD IV - as above  3. Moderate Aortic stenosis -  MG 26, AVA 1.5, Vmax 3.7 m/s.  Gradients likely exaggerated by hyperdynamic systolic function  4. Acute on chronic hypoxic respiratory failure - Multifactorial requiring intubation initially  5. Acute on chronic anemia - Hgb improved after transfusion  6. HTN - BP improving - Continue amlodipine 11mqd, hydralzine 5042m 6 hours and Imdur 104m17m  For questions or updates, please contact [facility] consult [email or web address] for contact info under        SignJarrett Soho  09/18/2018, 8:38 AM    Patient examined chart reviewed. She rarely gets OOB. Tachypnic at baseline basilar rales and AS murmur  Agree with home torsemide dosing follow Cr. Post transfusion with improved Hb.  Cardiology does not have much to add / offer  PeteJenkins Rouge

## 2018-09-18 NOTE — Progress Notes (Signed)
RN paged NP earlier because pt's RR was 17. CXR ordered and NP to bedside. S: pt states her SOB comes and goes and she is not SOB now. Says she is having no trouble breathing or any chest pain. Basically, nothing is bothering her.  O: Chronically ill appearing elderly female in NAD. She is alert, oriented. VS reviewed. O2 sat is normal. RR per NP count is 42. Respirations are shallow, but there is no noted distress. No use of accessory muscles. Crackles at the bases. A/P: 1. Tachypnea-likely due to CHF exacerbation. CXR confirms pulmonary edema which has improved since last exam. Gave Lasix 40mg . Should her mental status decline, can get an ABG. Do not think necessary at this point.  KJKG, NP [facility]

## 2018-09-18 NOTE — Progress Notes (Signed)
[facility]  PROGRESS NOTE  [name] [facility] OXB:[id number] DOB: February 23, 1940 DOA: 09/03/2018 PCP: Garwin Brothers, MD   Brief HPI:   78 year old female with a history of combined systolic and diastolic CHF, diabetes mellitus type 2, anemia, COPD, pulmonary hypertension came with shortness of breath.  Cardiology was consulted and patient started on IV Lasix.  Patient became hypoxic and was found to have acute respiratory with hypoxia and hypercapnia she was transferred to stepdown, initially placed on BiPAP.  PCCM was consulted and patient was intubated.  Patient got extubated on 09/15/2018 and transferred to floor.  [facility] resumed care on 09/16/2018.    Subjective   Patient seen and examined, denies shortness of breath or chest pain.  Was given 40 mg of Lasix IV last night for shortness of breath.   Assessment/Plan:     1. Acute on chronic hypoxic respiratory failure-in setting of chronic COPD, pulmonary edema, pulmonary hypertension.  Patient was extubated on 09/15/2018.  Breathing has improved.  Currently she is on oxygen via nasal cannula.  Lasix is currently on hold.  She did get Lasix 40 mg IV last night.  Continue Pulmicort, duo nebs 3 times daily.  2. AKI on CKD stage IV-patient baseline creatinine is 2.3-2.5, creatinine today is 2.37.  Lasix on hold per cardiology.  Will follow BMP in a.m.   Avoid nephrotoxins.  3. Hypertension-blood pressure is controlled, continue amlodipine, hydralazine.  4. Diabetes mellitus type 2-CBG fairly well controlled, continue sliding scale insulin with NovoLog.  5. COPD-stable, continue PRN bronchodilators.  6. Pulmonary hypertension-continue supportive care.    7. Hypokalemia-replete  8. Anemia-patient's hemoglobin is 6.9, 1 unit PRBC given this morning.  Repeat hemoglobin is 9.8.  This morning FOBT was positive in July 2020.  She had normal EGD on 07/25/2018.  Colonoscopy was not done as patient was not deemed to be a candidate for  colonoscopy.  Follow CBC in a.m., transfuse as needed for hemoglobin less than 7.    CBG: Recent Labs  Lab 09/18/18 0004 09/18/18 0506 09/18/18 0754 09/18/18 1137 09/18/18 1612  GLUCAP 236* 126* 140* 229* 228*    CBC: Recent Labs  Lab 09/14/18 0418 09/15/18 0448 09/16/18 0500 09/16/18 1400 09/17/18 0437 09/18/18 0441  WBC 13.6* 13.5* 10.3  --  13.8* 11.3*  HGB 8.4* 8.4* 6.9* 9.0* 9.8* 9.7*  HCT 27.3* 28.0* 23.0* 29.0* 31.6* 31.9*  MCV 96.5 97.2 96.6  --  93.2 93.0  PLT 197 215 179  --  243 683    Basic Metabolic Panel: Recent Labs  Lab 09/12/18 1213 09/12/18 1745 09/13/18 0220 09/13/18 2100 09/14/18 0418 09/15/18 0448 09/16/18 0500 09/17/18 0437 09/18/18 0441  NA  --   --  143  --  143 145 147* 144 142  K  --   --  4.1  --  4.0 3.8 2.7* 4.1 4.0  CL  --   --  100  --  99 97* 105 97* 97*  CO2  --   --  33*  --  35* 35* 29 32 31  GLUCOSE  --   --  191*  --  209* 118* 121* 140* 118*  BUN  --   --  110*  --  147* 141* 111* 125* 115*  CREATININE  --   --  2.86*  --  2.83* 2.78* 1.96* 2.36* 2.37*  CALCIUM  --   --  8.6*  --  8.5* 8.9 7.4* 9.2 9.0  MG 2.3 2.3 2.3 2.4  --   --  2.0  --   --   PHOS 3.5 4.2 4.2 4.0  --   --  2.9  --   --      DVT prophylaxis: Heparin  Code Status: Partial code, no CPR or defibrillation  Family Communication: No family at bedside  Disposition Plan: likely skilled nursing facility in 1 to 2 days.  Pressure Injury 07/05/18 Sacrum Mid Stage II -  Partial thickness loss of dermis presenting as a shallow open ulcer with a red, pink wound bed without slough. red with granulation tissue (Active)  07/05/18 1519  Location: Sacrum  Location Orientation: Mid  Staging: Stage II -  Partial thickness loss of dermis presenting as a shallow open ulcer with a red, pink wound bed without slough.  Wound Description (Comments): red with granulation tissue  Present on Admission: Yes     Pressure Injury 09/03/18 Heel Right Stage III -  Full  thickness tissue loss. Subcutaneous fat may be visible but bone, tendon or muscle are NOT exposed. (Active)  09/03/18 1445  Location: Heel  Location Orientation: Right  Staging: Stage III -  Full thickness tissue loss. Subcutaneous fat may be visible but bone, tendon or muscle are NOT exposed.  Wound Description (Comments):   Present on Admission: Yes     Pressure Injury 09/03/18 Foot Left;Other (Comment);Lateral Unstageable - Full thickness tissue loss in which the base of the ulcer is covered by slough (yellow, tan, gray, green or brown) and/or eschar (tan, brown or black) in the wound bed. (Active)  09/03/18 1445  Location: Foot  Location Orientation: Left;Other (Comment);Lateral (outer)  Staging: Unstageable - Full thickness tissue loss in which the base of the ulcer is covered by slough (yellow, tan, gray, green or brown) and/or eschar (tan, brown or black) in the wound bed.  Wound Description (Comments):   Present on Admission: Yes     Pressure Injury 09/03/18 Hip Anterior;Left Stage II -  Partial thickness loss of dermis presenting as a shallow open ulcer with a red, pink wound bed without slough. pink bed open (Active)  09/03/18 1445  Location: Hip  Location Orientation: Anterior;Left  Staging: Stage II -  Partial thickness loss of dermis presenting as a shallow open ulcer with a red, pink wound bed without slough.  Wound Description (Comments): pink bed open  Present on Admission: Yes      BMI  Estimated body mass index is 28.84 kg/m as calculated from the following:   Height as of this encounter: '5\' 5"'  (1.651 m).   Weight as of this encounter: 78.6 kg.  Scheduled medications:  . amLODipine  10 mg Oral Daily  . budesonide (PULMICORT) nebulizer solution  0.5 mg Nebulization BID  . chlorhexidine gluconate (MEDLINE KIT)  15 mL Mouth Rinse BID  . docusate sodium  100 mg Oral Daily  . feeding supplement (ENSURE ENLIVE)  237 mL Oral BID BM  . feeding supplement (PRO-STAT  SUGAR FREE 64)  30 mL Oral Daily  . folic acid  0.5 mg Oral Daily  . heparin  5,000 Units Subcutaneous Q8H  . hydrALAZINE  50 mg Oral Q6H  . insulin aspart  0-15 Units Subcutaneous Q4H  . ipratropium-albuterol  3 mL Nebulization TID  . iron polysaccharides  150 mg Oral BID  . isosorbide mononitrate  30 mg Oral Daily  . montelukast  10 mg Oral QHS  . multivitamin  1 tablet Oral Daily  . nutrition supplement (JUVEN)  1 packet Oral BID BM  . omega-3  acid ethyl esters  1 g Oral Daily  . pantoprazole  40 mg Oral Daily  . pyridOXINE  100 mg Oral Daily  . senna  1 tablet Oral BID  . simvastatin  20 mg Oral Daily  . torsemide  20 mg Oral BID  . traZODone  50 mg Oral QHS  . vitamin B-12  1,000 mcg Oral Daily  . vitamin E  400 Units Oral Daily    Consultants:  PCCM  Cardiology  Procedures:  ETT 9/1 to 9/5  Echocardiogram   Antibiotics:   Anti-infectives (From admission, onward)   None       Objective   Vitals:   09/18/18 0536 09/18/18 0708 09/18/18 0930 09/18/18 1315  BP:   (!) 166/66 (!) 158/64  Pulse: 86  79 63  Resp:   (!) 26 (!) 22  Temp:   98.3 F (36.8 C) 98.6 F (37 C)  TempSrc:   Oral Oral  SpO2: 100%  92% 94%  Weight:  78.6 kg    Height:        Intake/Output Summary (Last 24 hours) at 09/18/2018 1700 Last data filed at 09/18/2018 1634 Gross per 24 hour  Intake 320 ml  Output 2350 ml  Net -2030 ml   Filed Weights   09/16/18 0500 09/17/18 0500 09/18/18 0708  Weight: 81.7 kg 83 kg 78.6 kg     Physical Examination:   General-appears in no acute distress Heart-S1-S2, regular, no murmur auscultated Lungs-bilateral rhonchi auscultated Abdomen-soft, nontender, no organomegaly Extremities-no edema in the lower extremities Neuro-alert, oriented x3, no focal deficit noted    Data Reviewed: I have personally reviewed following labs and imaging studies   Recent Results (from the past 240 hour(s))  SARS Coronavirus 2 [facility] order, Performed in  [place] hospital lab) Nasopharyngeal Nasopharyngeal Swab     Status: None   Collection Time: 09/10/18 10:43 AM   Specimen: Nasopharyngeal Swab  Result Value Ref Range Status   SARS Coronavirus 2 NEGATIVE NEGATIVE Final    Comment: (NOTE) If result is NEGATIVE SARS-CoV-2 target nucleic acids are NOT DETECTED. The SARS-CoV-2 RNA is generally detectable in upper and lower  respiratory specimens during the acute phase of infection. The lowest  concentration of SARS-CoV-2 viral copies this assay can detect is 250  copies / mL. A negative result does not preclude SARS-CoV-2 infection  and should not be used as the sole basis for treatment or other  patient management decisions.  A negative result may occur with  improper specimen collection / handling, submission of specimen other  than nasopharyngeal swab, presence of viral mutation(s) within the  areas targeted by this assay, and inadequate number of viral copies  (<250 copies / mL). A negative result must be combined with clinical  observations, patient history, and epidemiological information. If result is POSITIVE SARS-CoV-2 target nucleic acids are DETECTED. The SARS-CoV-2 RNA is generally detectable in upper and lower  respiratory specimens dur ing the acute phase of infection.  Positive  results are indicative of active infection with SARS-CoV-2.  Clinical  correlation with patient history and other diagnostic information is  necessary to determine patient infection status.  Positive results do  not rule out bacterial infection or co-infection with other viruses. If result is PRESUMPTIVE POSTIVE SARS-CoV-2 nucleic acids MAY BE PRESENT.   A presumptive positive result was obtained on the submitted specimen  and confirmed on repeat testing.  While 2019 novel coronavirus  (SARS-CoV-2) nucleic acids may be present in the  submitted sample  additional confirmatory testing may be necessary for epidemiological  and / or clinical  management purposes  to differentiate between  SARS-CoV-2 and other Sarbecovirus currently known to infect humans.  If clinically indicated additional testing with an alternate test  methodology [phone]) is advised. The SARS-CoV-2 RNA is generally  detectable in upper and lower respiratory sp ecimens during the acute  phase of infection. The expected result is Negative. Fact Sheet for Patients:  StrictlyIdeas.no Fact Sheet for Healthcare Providers: BankingDealers.co.za This test is not yet approved or cleared by the [place] FDA and has been authorized for detection and/or diagnosis of SARS-CoV-2 by FDA under an Emergency Use Authorization (EUA).  This EUA will remain in effect (meaning this test can be used) for the duration of the COVID-19 declaration under Section 564(b)(1) of the Act, 21 U.S.C. section 360bbb-3(b)(1), unless the authorization is terminated or revoked sooner. Performed at [facility], [place] [address], [place], [place] [address]      Liver Function Tests: Recent Labs  Lab 09/13/18 0220 09/14/18 0418  AST 13* 10*  ALT 13 13  ALKPHOS 45 46  BILITOT 0.5 0.4  PROT 6.5 6.8  ALBUMIN 2.8* 2.8*    BNP (last 3 results) Recent Labs    09/03/18 0429 09/18/18 0320 09/18/18 0441  BNP 696.0* 551.8* 441.5*      Admission status: Inpatient: Based on patients clinical presentation and evaluation of above clinical data, I have made determination that patient meets Inpatient criteria at this time.  Time spent: 20 min  [facility] Pager [phone]. If 7PM-7AM, please contact night-coverage at [email or web address], Office  [phone]  password [email or web address]  09/18/2018, 5:00 PM  LOS: 15 days

## 2018-09-19 ENCOUNTER — Inpatient Hospital Stay (HOSPITAL_COMMUNITY): Payer: Medicare Other

## 2018-09-19 LAB — BASIC METABOLIC PANEL
Anion gap: 10 (ref 5–15)
BUN: 117 mg/dL — ABNORMAL HIGH (ref 8–23)
CO2: 34 mmol/L — ABNORMAL HIGH (ref 22–32)
Calcium: 9.2 mg/dL (ref 8.9–10.3)
Chloride: 98 mmol/L (ref 98–111)
Creatinine, Ser: 2.36 mg/dL — ABNORMAL HIGH (ref 0.44–1.00)
GFR calc Af Amer: 22 mL/min — ABNORMAL LOW (ref >60)
GFR calc non Af Amer: 19 mL/min — ABNORMAL LOW (ref >60)
Glucose, Bld: 97 mg/dL (ref 70–99)
Potassium: 4.1 mmol/L (ref 3.5–5.1)
Sodium: 142 mmol/L (ref 135–145)

## 2018-09-19 LAB — GLUCOSE, CAPILLARY
Glucose-Capillary: 137 mg/dL — ABNORMAL HIGH (ref 70–99)
Glucose-Capillary: 164 mg/dL — ABNORMAL HIGH (ref 70–99)
Glucose-Capillary: 222 mg/dL — ABNORMAL HIGH (ref 70–99)
Glucose-Capillary: 227 mg/dL — ABNORMAL HIGH (ref 70–99)
Glucose-Capillary: 235 mg/dL — ABNORMAL HIGH (ref 70–99)
Glucose-Capillary: 91 mg/dL (ref 70–99)

## 2018-09-19 LAB — NOVEL CORONAVIRUS, NAA (HOSP ORDER, SEND-OUT TO REF LAB; TAT 18-24 HRS): SARS-CoV-2, NAA: NOT DETECTED

## 2018-09-19 MED ORDER — IPRATROPIUM-ALBUTEROL 0.5-2.5 (3) MG/3ML IN SOLN
3.0000 mL | RESPIRATORY_TRACT | Status: DC | PRN
  Administered 2018-09-19: 3 mL via RESPIRATORY_TRACT
  Filled 2018-09-19: qty 3

## 2018-09-19 NOTE — Progress Notes (Addendum)
pt is breathing shallow breaths now about 54 times a min. she grunts during exhale and is twitching slighty. she is responses but answer questions slower. Mews change from green to yellow notified MD Carollee Massed. RR 42, JVD, waiting for CXR Results, Notifed MD Olive Bass updated Dr Wyline Copas on phone, administered breathing treatment

## 2018-09-19 NOTE — Progress Notes (Signed)
[facility] will sign off.   Medication Recommendations:  See yesterday's note  Other recommendations (labs, testing, etc):  N/A Follow up as an outpatient:  Will arrange follow up

## 2018-09-19 NOTE — Care Management Important Message (Signed)
Important Message  Patient Details IM Letter given to [name] SW to present to the Patient Name: [name] MRN: [id number] Date of Birth: December 15, 1940   Medicare Important Message Given:  Yes     Kerin Salen 09/19/2018, 11:23 AM

## 2018-09-19 NOTE — Progress Notes (Addendum)
  Speech Language Pathology Treatment: Dysphagia  Patient Details Name: [name] MRN: [id number] DOB: 1940-12-28 Today's Date: 09/19/2018 Time: 1010-1041 SLP Time Calculation (min) (ACUTE ONLY): 31 min  Assessment / Plan / Recommendation Clinical Impression  Pt noted to be overtly coughing with intake at the end of her meal. She admits to coughing with foods/particulate matter causing her choke.  She also complains of a "tickle" in her throat.  Sensation of lodging in esophagus reported by pt causing her to cough if not fully upright.  SLP is concerned pt may be having some aspiration and dysphagia that she states is NOT baseline.  Given h/o esophagitis reflux, SLP questions if she may have some esophageal dysmotility.  Spoke to RN regarding concerns and NT advised SLP of the same.  Will contact MD regarding care plan.  RN reports pt had an adequate bowel movement this am.  SLP advised pt to monitor her tolerance of po, stop intake if short of breath and consume small frequent meals/avoiding particulate matter.  Will follow up next date to assure pt has received adequate/appropriate compensation strategies.    Of note, pt not coughing after po intake = as SLP was documenting session at 1120.      HPI HPI: pt is a 78 yo female adm to [facility] with respiratory failure due to COPD and Heart Failure.  Pt required intubation 9/1-9/4.  Swallow eval ordered. She reports h/o GERD and is currently on Protonix.  Pt passed a [facility] swallow screen with this SLP on Saturday and has been on a diet since.  She was recently scoped by Dr Carlean Purl, 07/2018 with negative findings but does have h/o reflux esophagitis diagnosed 07/28/1999.   Pt denies dysphagia prior to admit but now reports coughing with intake = and sensation of residuals not clearing distal esophagus *pointing to distal esophagus.      SLP Plan  Continue with current plan of care       Recommendations  Diet recommendations: Other(comment)(avoid  particulate foods) Liquids provided via: Cup;Straw Medication Administration: Whole meds with liquid Compensations: Slow rate;Small sips/bites Postural Changes and/or Swallow Maneuvers: Seated upright 90 degrees;Upright 30-60 min after meal                Oral Care Recommendations: Oral care BID Follow up Recommendations: None SLP Visit Diagnosis: Dysphagia, unspecified (R13.10)(concern for possible esophageal dysphagia) Plan: Continue with current plan of care       GO              Luanna Salk, [facility] SLP [place] Pager [phone] Office [phone]   Macario Golds 09/19/2018, 11:13 AM

## 2018-09-19 NOTE — Progress Notes (Signed)
PROGRESS NOTE    [name] [facility]  LKT:[id number] DOB: [birth year] DOA: 09/03/2018 PCP: Garwin Brothers, MD    Brief Narrative:  78 year old female with a history of combined systolic and diastolic CHF, diabetes mellitus type 2, anemia, COPD, pulmonary hypertension came with shortness of breath.  Cardiology was consulted and patient started on IV Lasix.  Patient became hypoxic and was found to have acute respiratory with hypoxia and hypercapnia she was transferred to stepdown, initially placed on BiPAP.  PCCM was consulted and patient was intubated.  Patient got extubated on 09/15/2018 and transferred to floor.  [facility] service resumed care on 09/16/2018.   Assessment & Plan:   Principal Problem:   CHF (congestive heart failure), NYHA class IV, chronic, combined (HCC) Active Problems:   COPD with asthma ([place])   Anemia of chronic disease   Acute on chronic heart failure with preserved ejection fraction (HCC)   CKD (chronic kidney disease) stage 4, GFR 15-29 ml/min (HCC)   Type 2 diabetes mellitus (HCC)   Essential hypertension   Elevated brain natriuretic peptide (BNP) level   Troponin I above reference range   Acute on chronic combined systolic (congestive) and diastolic (congestive) heart failure (HCC)   Hyperkalemia   Acute renal failure superimposed on chronic kidney disease ([place])   Acute respiratory failure with hypoxia and hypercapnia (HCC)   Shock circulatory (HCC)   Acute respiratory failure with hypoxemia (HCC)   Acute encephalopathy   1. Acute on chronic hypoxic respiratory failure-in setting of chronic COPD, pulmonary edema, pulmonary hypertension.  Patient was extubated on 09/15/2018. Currently she is on oxygen via nasal cannula.  Now on torsemide per Cardiology.  Continue Pulmicort, duo nebs 3 times daily. Pt noted to have increased resp rate. Appropriate O2 sats. Have ordered STAT CXR and reviewed, patchy infiltrates B, but unchanged from prior xray  2. AKI on CKD stage  IV-patient baseline creatinine is 2.3-2.5, creatinine today is 2.37.  Now on torsemide per Cardiology.  Will follow BMP in a.m.   Avoid nephrotoxins.  3. Hypertension-blood pressure is controlled, continue amlodipine, hydralazine.  4. Diabetes mellitus type 2-CBG fairly well controlled, continue sliding scale insulin with NovoLog. Stable at present  5. COPD-stable, continue PRN bronchodilators.  6. Pulmonary hypertension-continue supportive care.    7. Hypokalemia-replaced  8. Anemia-patient's hemoglobin is 6.9, 1 unit PRBC given this morning.  Repeat hemoglobin is 9.8.  This morning FOBT was positive in July 2020.  She had normal EGD on 07/25/2018.  Colonoscopy was not done as patient was not deemed to be a candidate for colonoscopy.  Repeat CBC in AM  DVT prophylaxis: Heparin subq Code Status: Partial Family Communication: Pt in room, family not at bedside Disposition Plan: Uncertain at this time  Consultants:     Procedures:     Antimicrobials: Anti-infectives (From admission, onward)   None       Subjective: Without complaints this AM  Objective: Vitals:   09/19/18 1058 09/19/18 1341 09/19/18 1729 09/19/18 1750  BP: (!) 152/70 (!) 150/69 (!) 121/92   Pulse: 78 77 87   Resp: 14 16 (!) 52   Temp: 98.8 F (37.1 C) 98.9 F (37.2 C) 98.5 F (36.9 C)   TempSrc: Oral Oral Oral   SpO2: 98% 96% 91% 97%  Weight:      Height:        Intake/Output Summary (Last 24 hours) at 09/19/2018 1826 Last data filed at 09/19/2018 1433 Gross per 24 hour  Intake 300 ml  Output 1000 ml  Net -700 ml   Filed Weights   09/17/18 0500 09/18/18 0708 09/19/18 0500  Weight: 83 kg 78.6 kg 78.1 kg    Examination:  General exam: Appears calm and comfortable  Respiratory system: Clear to auscultation. Respiratory effort normal. Cardiovascular system: S1 & S2 heard, RRR Gastrointestinal system: Abdomen is nondistended, soft and nontender. No organomegaly or masses felt. Normal  bowel sounds heard. Central nervous system: Alert and oriented. No focal neurological deficits. Extremities: Symmetric 5 x 5 power. Skin: No rashes, lesions Psychiatry: Judgement and insight appear normal. Mood & affect appropriate.   Data Reviewed: I have personally reviewed following labs and imaging studies  CBC: Recent Labs  Lab 09/14/18 0418 09/15/18 0448 09/16/18 0500 09/16/18 1400 09/17/18 0437 09/18/18 0441  WBC 13.6* 13.5* 10.3  --  13.8* 11.3*  HGB 8.4* 8.4* 6.9* 9.0* 9.8* 9.7*  HCT 27.3* 28.0* 23.0* 29.0* 31.6* 31.9*  MCV 96.5 97.2 96.6  --  93.2 93.0  PLT 197 215 179  --  243 470   Basic Metabolic Panel: Recent Labs  Lab 09/13/18 0220 09/13/18 2100  09/15/18 0448 09/16/18 0500 09/17/18 0437 09/18/18 0441 09/19/18 0320  NA 143  --    < > 145 147* 144 142 142  K 4.1  --    < > 3.8 2.7* 4.1 4.0 4.1  CL 100  --    < > 97* 105 97* 97* 98  CO2 33*  --    < > 35* 29 32 31 34*  GLUCOSE 191*  --    < > 118* 121* 140* 118* 97  BUN 110*  --    < > 141* 111* 125* 115* 117*  CREATININE 2.86*  --    < > 2.78* 1.96* 2.36* 2.37* 2.36*  CALCIUM 8.6*  --    < > 8.9 7.4* 9.2 9.0 9.2  MG 2.3 2.4  --   --  2.0  --   --   --   PHOS 4.2 4.0  --   --  2.9  --   --   --    < > = values in this interval not displayed.   GFR: Estimated Creatinine Clearance: 20.9 mL/min (A) (by C-G formula based on SCr of 2.36 mg/dL (H)). Liver Function Tests: Recent Labs  Lab 09/13/18 0220 09/14/18 0418  AST 13* 10*  ALT 13 13  ALKPHOS 45 46  BILITOT 0.5 0.4  PROT 6.5 6.8  ALBUMIN 2.8* 2.8*   No results for input(s): LIPASE, AMYLASE in the last 168 hours. No results for input(s): AMMONIA in the last 168 hours. Coagulation Profile: No results for input(s): INR, PROTIME in the last 168 hours. Cardiac Enzymes: No results for input(s): CKTOTAL, CKMB, CKMBINDEX, TROPONINI in the last 168 hours. BNP (last 3 results) No results for input(s): PROBNP in the last 8760 hours. HbA1C: No  results for input(s): HGBA1C in the last 72 hours. CBG: Recent Labs  Lab 09/19/18 0002 09/19/18 0344 09/19/18 0735 09/19/18 1240 09/19/18 1623  GLUCAP 227* 91 137* 235* 222*   Lipid Profile: No results for input(s): CHOL, HDL, LDLCALC, TRIG, CHOLHDL, LDLDIRECT in the last 72 hours. Thyroid Function Tests: No results for input(s): TSH, T4TOTAL, FREET4, T3FREE, THYROIDAB in the last 72 hours. Anemia Panel: No results for input(s): VITAMINB12, FOLATE, FERRITIN, TIBC, IRON, RETICCTPCT in the last 72 hours. Sepsis Labs: No results for input(s): PROCALCITON, LATICACIDVEN in the last 168 hours.  Recent Results (from the past 240  hour(s))  SARS Coronavirus 2 [facility] order, Performed in [facility] hospital lab) Nasopharyngeal Nasopharyngeal Swab     Status: None   Collection Time: 09/10/18 10:43 AM   Specimen: Nasopharyngeal Swab  Result Value Ref Range Status   SARS Coronavirus 2 NEGATIVE NEGATIVE Final    Comment: (NOTE) If result is NEGATIVE SARS-CoV-2 target nucleic acids are NOT DETECTED. The SARS-CoV-2 RNA is generally detectable in upper and lower  respiratory specimens during the acute phase of infection. The lowest  concentration of SARS-CoV-2 viral copies this assay can detect is 250  copies / mL. A negative result does not preclude SARS-CoV-2 infection  and should not be used as the sole basis for treatment or other  patient management decisions.  A negative result may occur with  improper specimen collection / handling, submission of specimen other  than nasopharyngeal swab, presence of viral mutation(s) within the  areas targeted by this assay, and inadequate number of viral copies  (<250 copies / mL). A negative result must be combined with clinical  observations, patient history, and epidemiological information. If result is POSITIVE SARS-CoV-2 target nucleic acids are DETECTED. The SARS-CoV-2 RNA is generally detectable in upper and lower  respiratory specimens  dur ing the acute phase of infection.  Positive  results are indicative of active infection with SARS-CoV-2.  Clinical  correlation with patient history and other diagnostic information is  necessary to determine patient infection status.  Positive results do  not rule out bacterial infection or co-infection with other viruses. If result is PRESUMPTIVE POSTIVE SARS-CoV-2 nucleic acids MAY BE PRESENT.   A presumptive positive result was obtained on the submitted specimen  and confirmed on repeat testing.  While 2019 novel coronavirus  (SARS-CoV-2) nucleic acids may be present in the submitted sample  additional confirmatory testing may be necessary for epidemiological  and / or clinical management purposes  to differentiate between  SARS-CoV-2 and other Sarbecovirus currently known to infect humans.  If clinically indicated additional testing with an alternate test  methodology [phone]) is advised. The SARS-CoV-2 RNA is generally  detectable in upper and lower respiratory sp ecimens during the acute  phase of infection. The expected result is Negative. Fact Sheet for Patients:  StrictlyIdeas.no Fact Sheet for Healthcare Providers: BankingDealers.co.za This test is not yet approved or cleared by the [place] FDA and has been authorized for detection and/or diagnosis of SARS-CoV-2 by FDA under an Emergency Use Authorization (EUA).  This EUA will remain in effect (meaning this test can be used) for the duration of the COVID-19 declaration under Section 564(b)(1) of the Act, 21 U.S.C. section 360bbb-3(b)(1), unless the authorization is terminated or revoked sooner. Performed at [facility], [place] [address], [place], [place] [address]   Novel Coronavirus, NAA (hospital order; send-out to ref lab)     Status: None   Collection Time: 09/18/18 12:08 PM   Specimen: Nasopharyngeal Swab; Respiratory  Result Value Ref Range  Status   SARS-CoV-2, NAA NOT DETECTED NOT DETECTED Final    Comment: (NOTE) This nucleic acid amplification test was developed and its performance characteristics determined by [employer]. Nucleic acid amplification tests include PCR and TMA. This test has not been FDA cleared or approved. This test has been authorized by FDA under an Emergency Use Authorization (EUA). This test is only authorized for the duration of time the declaration that circumstances exist justifying the authorization of the emergency use of in vitro diagnostic tests for detection of SARS-CoV-2 virus  and/or diagnosis of COVID-19 infection under section 564(b)(1) of the Act, 21 U.S.C. 878MVE-7(M) (1), unless the authorization is terminated or revoked sooner. When diagnostic testing is negative, the possibility of a false negative result should be considered in the context of a patient's recent exposures and the presence of clinical signs and symptoms consistent with COVID-19. An individual without symptoms of COVID- 19 and who is not shedding SARS-CoV-2 vi rus would expect to have a negative (not detected) result in this assay. Performed At: [facility] [place], [place] [id number] Rush Farmer MD ZM:[id number]    [place]  Final    Comment: Performed at [place] [address], [place], [place] [address]     Radiology Studies: Dg Chest Port 1 View  Result Date: 09/17/2018 CLINICAL DATA:  Tachypnea EXAM: PORTABLE CHEST 1 VIEW COMPARISON:  09/13/2018 FINDINGS: Cardiomegaly with vascular congestion and probable mild pulmonary edema, slightly improved since prior study. No effusions or acute bony abnormality. IMPRESSION: Cardiomegaly, vascular congestion and mild pulmonary edema, improved since prior study. Electronically Signed   By: Rolm Baptise M.D.   On: 09/17/2018 21:56   Dg Esophagus W Single Cm (sol Or Thin Ba)  Result  Date: 09/19/2018 CLINICAL DATA:  Difficulty swallowing. Choking sensation on solids. EXAM: ESOPHOGRAM/BARIUM SWALLOW TECHNIQUE: Single contrast examination was performed using  thin barium. FLUOROSCOPY TIME:  Fluoroscopy Time:  42 seconds Radiation Exposure Index (if provided by the fluoroscopic device): 23.20 mGy Number of Acquired Spot Images: 0 COMPARISON:  None. FINDINGS: Limited study due to patient's limited mobility. The esophagus was smooth in contour. The esophagus demonstrates very poor motility, with stasis of contrast, prominent tertiary contractions and reflux of contrast toward the hypopharynx. There was no narrowing or stricture. There was no hiatal hernia. IMPRESSION: Poor motility of the esophagus with prominent tertiary contractions and reflux of contrast toward the hypopharynx. Electronically Signed   By: Fidela Salisbury M.D.   On: 09/19/2018 13:48    Scheduled Meds: . amLODipine  10 mg Oral Daily  . budesonide (PULMICORT) nebulizer solution  0.5 mg Nebulization BID  . chlorhexidine gluconate (MEDLINE KIT)  15 mL Mouth Rinse BID  . docusate sodium  100 mg Oral Daily  . feeding supplement (ENSURE ENLIVE)  237 mL Oral BID BM  . feeding supplement (PRO-STAT SUGAR FREE 64)  30 mL Oral Daily  . folic acid  0.5 mg Oral Daily  . heparin  5,000 Units Subcutaneous Q8H  . hydrALAZINE  50 mg Oral Q6H  . insulin aspart  0-15 Units Subcutaneous Q4H  . ipratropium-albuterol  3 mL Nebulization TID  . iron polysaccharides  150 mg Oral BID  . isosorbide mononitrate  30 mg Oral Daily  . montelukast  10 mg Oral QHS  . multivitamin  1 tablet Oral Daily  . nutrition supplement (JUVEN)  1 packet Oral BID BM  . omega-3 acid ethyl esters  1 g Oral Daily  . pantoprazole  40 mg Oral Daily  . pyridOXINE  100 mg Oral Daily  . senna  1 tablet Oral BID  . simvastatin  20 mg Oral Daily  . torsemide  20 mg Oral BID  . traZODone  50 mg Oral QHS  . vitamin B-12  1,000 mcg Oral Daily  . vitamin E  400  Units Oral Daily   Continuous Infusions:   LOS: 16 days   Marylu Lund, MD [facility] Pager On Amion  If 7PM-7AM, please contact night-coverage 09/19/2018,  6:26 PM

## 2018-09-20 DIAGNOSIS — R1310 Dysphagia, unspecified: Secondary | ICD-10-CM

## 2018-09-20 LAB — GLUCOSE, CAPILLARY
Glucose-Capillary: 100 mg/dL — ABNORMAL HIGH (ref 70–99)
Glucose-Capillary: 114 mg/dL — ABNORMAL HIGH (ref 70–99)
Glucose-Capillary: 128 mg/dL — ABNORMAL HIGH (ref 70–99)
Glucose-Capillary: 191 mg/dL — ABNORMAL HIGH (ref 70–99)
Glucose-Capillary: 249 mg/dL — ABNORMAL HIGH (ref 70–99)
Glucose-Capillary: 279 mg/dL — ABNORMAL HIGH (ref 70–99)

## 2018-09-20 MED ORDER — PIPERACILLIN-TAZOBACTAM 3.375 G IVPB 30 MIN
3.3750 g | Freq: Once | INTRAVENOUS | Status: AC
  Administered 2018-09-20: 3.375 g via INTRAVENOUS
  Filled 2018-09-20: qty 50

## 2018-09-20 MED ORDER — PIPERACILLIN-TAZOBACTAM 3.375 G IVPB
3.3750 g | Freq: Three times a day (TID) | INTRAVENOUS | Status: DC
  Administered 2018-09-20 – 2018-09-21 (×2): 3.375 g via INTRAVENOUS
  Filled 2018-09-20 (×2): qty 50

## 2018-09-20 NOTE — Progress Notes (Signed)
Daily Progress Note   Patient Name: Nicole Scaduto [facility]       Date: 09/20/2018 DOB: 08/01/1940  Age: 78 y.o. MRN#: [id number] Attending Physician: Donne Hazel, MD Primary Care Physician: Garwin Brothers, MD Admit Date: 09/03/2018  Reason for Consultation/Follow-up: Establishing goals of care  Subjective: Discussed case with SLP and Dr. Wyline Copas.   Chart reviewed including personal review of pertinent labs and imaging.  I saw and examined Ms. Wygant.    She reports that her care team has done "pretty good job" of explaining things to her.  We reviewed her clinical course, including results of esophogram with concern for dysmotility and resultant aspiration.  She reports that she does not feel that her swallow function is impaired and "I think you have the wrong person."  She did not really engage in goals conversation as she again stated that she thinks that her swallowing is "not really as bad as they are saying."  Length of Stay: 17  Current Medications: Scheduled Meds:  . amLODipine  10 mg Oral Daily  . budesonide (PULMICORT) nebulizer solution  0.5 mg Nebulization BID  . chlorhexidine gluconate (MEDLINE KIT)  15 mL Mouth Rinse BID  . docusate sodium  100 mg Oral Daily  . feeding supplement (ENSURE ENLIVE)  237 mL Oral BID BM  . feeding supplement (PRO-STAT SUGAR FREE 64)  30 mL Oral Daily  . folic acid  0.5 mg Oral Daily  . heparin  5,000 Units Subcutaneous Q8H  . hydrALAZINE  50 mg Oral Q6H  . insulin aspart  0-15 Units Subcutaneous Q4H  . ipratropium-albuterol  3 mL Nebulization TID  . iron polysaccharides  150 mg Oral BID  . isosorbide mononitrate  30 mg Oral Daily  . montelukast  10 mg Oral QHS  . multivitamin  1 tablet Oral Daily  . nutrition supplement (JUVEN)  1 packet Oral BID  BM  . omega-3 acid ethyl esters  1 g Oral Daily  . pantoprazole  40 mg Oral Daily  . pyridOXINE  100 mg Oral Daily  . senna  1 tablet Oral BID  . simvastatin  20 mg Oral Daily  . torsemide  20 mg Oral BID  . traZODone  50 mg Oral QHS  . vitamin B-12  1,000 mcg Oral Daily  . vitamin E  400 Units Oral Daily    Continuous Infusions: . piperacillin-tazobactam (ZOSYN)  IV      PRN Meds: acetaminophen **OR** acetaminophen, alum & mag hydroxide-simeth, cyclobenzaprine, hydrALAZINE, ipratropium-albuterol, oxyCODONE-acetaminophen  Physical Exam         General: Alert, awake, in no acute distress.  HEENT: No bruits, no goiter, no JVD Heart: Regular rate and rhythm. No murmur appreciated. Lungs: Good air movement, clear Abdomen: Soft, nontender, nondistended, positive bowel sounds.  Ext: No significant edema  Vital Signs: BP (!) 139/52 (BP Location: Right Arm)   Pulse 70   Temp 97.7 F (36.5 C) (Oral)   Resp 18   Ht '5\' 5"'  (1.651 m)   Wt 79.4 kg   SpO2 95%   BMI 29.13 kg/m  SpO2: SpO2: 95 % O2 Device: O2 Device: Nasal Cannula O2 Flow Rate: O2 Flow Rate (L/min): 2 L/min  Intake/output summary:   Intake/Output Summary (Last 24 hours) at 09/20/2018 2210 Last data filed at 09/20/2018 2004 Gross per 24 hour  Intake 600 ml  Output 1000 ml  Net -400 ml   LBM: Last BM Date: 09/20/18 Baseline Weight: Weight: 85.2 kg Most recent weight: Weight: 79.4 kg       Palliative Assessment/Data:    Flowsheet Rows     Most Recent Value  Intake Tab  Referral Department  Hospitalist  Unit at Time of Referral  ICU  Date Notified  09/11/18  Palliative Care Type  New Palliative care  Reason for referral  Clarify Goals of Care  Date of Admission  09/03/18  Date first seen by Palliative Care  09/12/18  # of days Palliative referral response time  1 Day(s)  # of days IP prior to Palliative referral  8  Clinical Assessment  Psychosocial & Spiritual Assessment  Palliative Care Outcomes       Patient Active Problem List   Diagnosis Date Noted  . Acute respiratory failure with hypoxemia ([place])   . Acute encephalopathy   . Acute respiratory failure with hypoxia and hypercapnia ([place]) 09/11/2018  . Shock circulatory ([place])   . Elevated brain natriuretic peptide (BNP) level 09/03/2018  . Troponin I above reference range 09/03/2018  . Acute on chronic combined systolic (congestive) and diastolic (congestive) heart failure ([place]) 09/03/2018  . Hyperkalemia 09/03/2018  . Acute renal failure superimposed on chronic kidney disease ([place])   . Pressure injury of skin 07/24/2018  . Aortic atherosclerosis ([place]) 07/24/2018  . Occult blood positive stool   . Anemia due to GI blood loss 07/23/2018  . Essential hypertension 07/05/2018  . CHF (congestive heart failure), NYHA class IV, chronic, combined ([place]) 07/05/2018  . Diabetic hyperosmolar non-ketotic state ([place]) 06/17/2018  . Hyperglycemia 06/16/2018  . Type 2 diabetes mellitus ([place]) 06/16/2018  . UTI (urinary tract infection) 06/16/2018  . Sacral decubitus ulcer 06/16/2018  . Hypertensive urgency 04/04/2017  . CKD (chronic kidney disease) stage 4, GFR 15-29 ml/min (HCC) 04/04/2017  . Hypoglycemia   . Metabolic acidosis with normal anion gap and bicarbonate losses 11/23/2016  . Acute on chronic heart failure with preserved ejection fraction ([place]) 11/22/2016  . Midfoot ulcer, left, limited to breakdown of skin ([place]) 06/13/2016  . Idiopathic chronic venous hypertension of both lower extremities with inflammation 03/10/2016  . Onychomycosis 03/10/2016  . Chronic obstructive pulmonary disease ([place]) 11/07/2013  . Upper airway cough syndrome 11/07/2013  . Insomnia 12/10/2012  . OSA (obstructive sleep apnea) 09/17/2012  . Benign neoplasm of colon 08/14/2012  . Rectal bleeding 08/13/2012  Class: Acute  . Anemia of chronic disease 08/13/2012    Class: Acute  . Osteoarthritis 08/13/2012    Class: Chronic  . Acute posthemorrhagic  anemia 08/13/2012  . Diverticulosis of colon (without mention of hemorrhage) 08/13/2012  . Hoarseness 07/20/2011  . COPD with asthma ([place]) 04/20/2011  . Allergic rhinitis 04/20/2011  . GI bleed 02/08/2011  . Personal history of colonic polyps 04/16/2010  . Fecal incontinence 03/23/2010  . Change in bowel habits 03/23/2010    Palliative Care Assessment & Plan   Patient Profile: 78 year old female with prolonged hospital course including ICU admission and prior ventilator dependence with concern for espohageal dysmotility with dysphagia.  Recommendations/Plan:  Attempted to re-engage in conversation regarding goals of care.  Patient appears to understand what she is being told, but she currently does not believe that she has esophageal dysmotility and not willing to engage in this conversation with me today.  She would like to continue with current care.  Palliative will follow up tomorrow.  Code Status:    Code Status Orders  (From admission, onward)         Start     Ordered   09/11/18 1228  Limited resuscitation (code)  Continuous    Question Answer Comment  In the event of cardiac or respiratory ARREST: Initiate Code Blue, Call Rapid Response Yes   In the event of cardiac or respiratory ARREST: Perform CPR No   In the event of cardiac or respiratory ARREST: Perform Intubation/Mechanical Ventilation Yes   In the event of cardiac or respiratory ARREST: Use NIPPV/BiPAp only if indicated Yes   In the event of cardiac or respiratory ARREST: Administer ACLS medications if indicated Yes   In the event of cardiac or respiratory ARREST: Perform Defibrillation or Cardioversion if indicated No      09/11/18 1227        Code Status History    Date Active Date Inactive Code Status Order ID Comments User Context   09/03/2018 0617 09/11/2018 1227 Full Code [id number]  Tomma Rakers, MD ED   07/23/2018 2000 08/02/2018 1822 Full Code [id number]  Lenore Cordia, MD ED   07/05/2018  1418 07/10/2018 2257 Full Code [id number]  Vashti Hey, MD ED   06/16/2018 2237 06/20/2018 1904 Full Code [id number]  Shela Leff, MD ED   04/04/2017 0134 04/08/2017 1841 Full Code [id number]  Rise Patience, MD ED   11/22/2016 0915 12/02/2016 2209 Full Code [id number]  Rondel Jumbo, PA-C ED   02/04/2013 0144 02/07/2013 2320 Full Code [id number]  Geoffery Lyons, MD Inpatient   Advance Care Planning Activity    Advance Directive Documentation     Most Recent Value  Type of Advance Directive  Healthcare Power of Attorney  Pre-existing out of facility DNR order (yellow form or pink MOST form)  -  "MOST" Form in Place?  -       Prognosis:   Guarded  Discharge Planning:  To Be Determined  Care plan was discussed with Patient, RN, SLP, Dr. Wyline Copas  Thank you for allowing the Palliative Medicine Team to assist in the care of this patient.   Time In: 1400 Time Out: 1440 Total Time 40 Prolonged Time Billed No      Greater than 50%  of this time was spent counseling and coordinating care related to the above assessment and plan.  Micheline Rough, MD  Please contact Palliative Medicine Team phone at [phone] for questions and concerns.

## 2018-09-20 NOTE — Progress Notes (Signed)
Patient's MEWS changed to yellow because she was breathing 26 times per minute. She is alert and oriented. All other VS are stable.   MD Wyline Copas made aware. No new orders.   Will continue to monitor.

## 2018-09-20 NOTE — Progress Notes (Signed)
PROGRESS NOTE    Moe Brier [facility]  VXY:[id number] DOB: 10/15/1940 DOA: 09/03/2018 PCP: Garwin Brothers, MD    Brief Narrative:  78 year old female with a history of combined systolic and diastolic CHF, diabetes mellitus type 2, anemia, COPD, pulmonary hypertension came with shortness of breath.  Cardiology was consulted and patient started on IV Lasix.  Patient became hypoxic and was found to have acute respiratory with hypoxia and hypercapnia she was transferred to stepdown, initially placed on BiPAP.  PCCM was consulted and patient was intubated.  Patient got extubated on 09/15/2018 and transferred to floor.  [facility] service resumed care on 09/16/2018.   Assessment & Plan:   Principal Problem:   CHF (congestive heart failure), NYHA class IV, chronic, combined (HCC) Active Problems:   COPD with asthma ([place])   Anemia of chronic disease   Acute on chronic heart failure with preserved ejection fraction (HCC)   CKD (chronic kidney disease) stage 4, GFR 15-29 ml/min (HCC)   Type 2 diabetes mellitus (HCC)   Essential hypertension   Elevated brain natriuretic peptide (BNP) level   Troponin I above reference range   Acute on chronic combined systolic (congestive) and diastolic (congestive) heart failure (HCC)   Hyperkalemia   Acute renal failure superimposed on chronic kidney disease ([place])   Acute respiratory failure with hypoxia and hypercapnia (HCC)   Shock circulatory (HCC)   Acute respiratory failure with hypoxemia (HCC)   Acute encephalopathy   1. Acute on chronic hypoxic respiratory failure-in setting of chronic COPD, pulmonary edema, pulmonary hypertension.  Patient was extubated on 09/15/2018. Currently she is on oxygen via nasal cannula.  Now on torsemide per Cardiology. 1. Increased respiratory effort noted after esophogram  2. CXR obtained and personally reviewed. Findings are worrisome for possible aspiration given concerns of dysphagia 3. Discussed with SLP who suggested  Palliative Care. Have discussed case with Palliative Care 4. Will start pt on empiric zosyn  2. AKI on CKD stage IV-patient baseline creatinine is 2.3-2.5, creatinine today is 2.37.  Now on torsemide per Cardiology. Repeat BMET in AM.   Avoid nephrotoxins.  3. Hypertension -blood pressure is controlled, continue amlodipine, hydralazine as tolerated.  4. Diabetes mellitus type 2-CBG fairly well controlled, continue sliding scale insulin with NovoLog. Currently stable  5. COPD- Presently stable, continue PRN bronchodilators as needed  6. Pulmonary hypertension-continue supportive care as tolerated   7. Hypokalemia -Replaced, most recent K of 4.1  8. Anemia-patient's hemoglobin is 6.9, 1 unit PRBC given this morning.  Repeat hemoglobin is 9.8.  This morning FOBT was positive in July 2020.  She had normal EGD on 07/25/2018.  Colonoscopy was not done as patient was not deemed to be a candidate for colonoscopy.  Remains hemodynamically stable. Would repeat CBC in AM  DVT prophylaxis: Heparin subq Code Status: Partial Family Communication: Pt in room, family not at bedside Disposition Plan: Uncertain at this time  Consultants:     Procedures:     Antimicrobials: Anti-infectives (From admission, onward)   Start     Dose/Rate Route Frequency Ordered Stop   09/20/18 2200  piperacillin-tazobactam (ZOSYN) IVPB 3.375 g     3.375 g 12.5 mL/hr over 240 Minutes Intravenous Every 8 hours 09/20/18 1507     09/20/18 1530  piperacillin-tazobactam (ZOSYN) IVPB 3.375 g     3.375 g 100 mL/hr over 30 Minutes Intravenous  Once 09/20/18 1506 09/20/18 1650      Subjective: No complaints when seen. Denies sob, however has visible tachypnea  Objective: Vitals:   09/20/18 1049 09/20/18 1125 09/20/18 1300 09/20/18 1459  BP: 135/61  (!) 137/56 (!) 142/68  Pulse: 76  79 84  Resp: (!) 26 (!) 24 (!) 26 (!) 22  Temp: 98.2 F (36.8 C)  97.6 F (36.4 C) 98.2 F (36.8 C)  TempSrc: Oral  Oral  Oral  SpO2: 96%  97% 98%  Weight:      Height:        Intake/Output Summary (Last 24 hours) at 09/20/2018 1749 Last data filed at 09/20/2018 1540 Gross per 24 hour  Intake 360 ml  Output 1000 ml  Net -640 ml   Filed Weights   09/18/18 0708 09/19/18 0500 09/20/18 0500  Weight: 78.6 kg 78.1 kg 79.4 kg    Examination: General exam: Awake, laying in bed, in nad Respiratory system: Increased respiratory rate, no wheezing Cardiovascular system: regular rate, s1, s2 Gastrointestinal system: Soft, nondistended, positive BS Central nervous system: CN2-12 grossly intact, strength intact Extremities: Perfused, no clubbing Skin: Normal skin turgor, no notable skin lesions seen Psychiatry: Mood normal // no visual hallucinations   Data Reviewed: I have personally reviewed following labs and imaging studies  CBC: Recent Labs  Lab 09/14/18 0418 09/15/18 0448 09/16/18 0500 09/16/18 1400 09/17/18 0437 09/18/18 0441  WBC 13.6* 13.5* 10.3  --  13.8* 11.3*  HGB 8.4* 8.4* 6.9* 9.0* 9.8* 9.7*  HCT 27.3* 28.0* 23.0* 29.0* 31.6* 31.9*  MCV 96.5 97.2 96.6  --  93.2 93.0  PLT 197 215 179  --  243 638   Basic Metabolic Panel: Recent Labs  Lab 09/13/18 2100  09/15/18 0448 09/16/18 0500 09/17/18 0437 09/18/18 0441 09/19/18 0320  NA  --    < > 145 147* 144 142 142  K  --    < > 3.8 2.7* 4.1 4.0 4.1  CL  --    < > 97* 105 97* 97* 98  CO2  --    < > 35* 29 32 31 34*  GLUCOSE  --    < > 118* 121* 140* 118* 97  BUN  --    < > 141* 111* 125* 115* 117*  CREATININE  --    < > 2.78* 1.96* 2.36* 2.37* 2.36*  CALCIUM  --    < > 8.9 7.4* 9.2 9.0 9.2  MG 2.4  --   --  2.0  --   --   --   PHOS 4.0  --   --  2.9  --   --   --    < > = values in this interval not displayed.   GFR: Estimated Creatinine Clearance: 21.1 mL/min (A) (by C-G formula based on SCr of 2.36 mg/dL (H)). Liver Function Tests: Recent Labs  Lab 09/14/18 0418  AST 10*  ALT 13  ALKPHOS 46  BILITOT 0.4  PROT 6.8    ALBUMIN 2.8*   No results for input(s): LIPASE, AMYLASE in the last 168 hours. No results for input(s): AMMONIA in the last 168 hours. Coagulation Profile: No results for input(s): INR, PROTIME in the last 168 hours. Cardiac Enzymes: No results for input(s): CKTOTAL, CKMB, CKMBINDEX, TROPONINI in the last 168 hours. BNP (last 3 results) No results for input(s): PROBNP in the last 8760 hours. HbA1C: No results for input(s): HGBA1C in the last 72 hours. CBG: Recent Labs  Lab 09/20/18 0030 09/20/18 0336 09/20/18 0735 09/20/18 1224 09/20/18 1625  GLUCAP 128* 100* 114* 279* 249*   Lipid Profile: No results  for input(s): CHOL, HDL, LDLCALC, TRIG, CHOLHDL, LDLDIRECT in the last 72 hours. Thyroid Function Tests: No results for input(s): TSH, T4TOTAL, FREET4, T3FREE, THYROIDAB in the last 72 hours. Anemia Panel: No results for input(s): VITAMINB12, FOLATE, FERRITIN, TIBC, IRON, RETICCTPCT in the last 72 hours. Sepsis Labs: No results for input(s): PROCALCITON, LATICACIDVEN in the last 168 hours.  Recent Results (from the past 240 hour(s))  Novel Coronavirus, NAA (hospital order; send-out to ref lab)     Status: None   Collection Time: 09/18/18 12:08 PM   Specimen: Nasopharyngeal Swab; Respiratory  Result Value Ref Range Status   SARS-CoV-2, NAA NOT DETECTED NOT DETECTED Final    Comment: (NOTE) This nucleic acid amplification test was developed and its performance characteristics determined by [employer]. Nucleic acid amplification tests include PCR and TMA. This test has not been FDA cleared or approved. This test has been authorized by FDA under an Emergency Use Authorization (EUA). This test is only authorized for the duration of time the declaration that circumstances exist justifying the authorization of the emergency use of in vitro diagnostic tests for detection of SARS-CoV-2 virus and/or diagnosis of COVID-19 infection under section 564(b)(1) of the Act, 21  U.S.C. 432XMD-4(J) (1), unless the authorization is terminated or revoked sooner. When diagnostic testing is negative, the possibility of a false negative result should be considered in the context of a patient's recent exposures and the presence of clinical signs and symptoms consistent with COVID-19. An individual without symptoms of COVID- 19 and who is not shedding SARS-CoV-2 vi rus would expect to have a negative (not detected) result in this assay. Performed At: [facility] [place], [place] [id number] Rush Farmer MD UY:[id number]    [place]  Final    Comment: Performed at [facility] [address], [place], [place] [address]     Radiology Studies: Dg Chest Port 1 View  Result Date: 09/19/2018 CLINICAL DATA:  Aspiration into airway EXAM: PORTABLE CHEST 1 VIEW COMPARISON:  09/17/2018 FINDINGS: Cardiomegaly with mild perihilar edema. Superimposed upper lobe/perihilar opacities along with left lower lobe/retrocardiac opacity, favoring multifocal pneumonia, possibly on the basis of aspiration. No definite pleural effusions. No pneumothorax. IMPRESSION: Cardiomegaly with mild perihilar edema. Superimposed multifocal patchy opacities in the upper lobes and left lower lobe, suspicious for multifocal pneumonia, possibly on the basis of aspiration given the clinical history. Electronically Signed   By: Julian Hy M.D.   On: 09/19/2018 18:56   Dg Esophagus W Single Cm (sol Or Thin Ba)  Result Date: 09/19/2018 CLINICAL DATA:  Difficulty swallowing. Choking sensation on solids. EXAM: ESOPHOGRAM/BARIUM SWALLOW TECHNIQUE: Single contrast examination was performed using  thin barium. FLUOROSCOPY TIME:  Fluoroscopy Time:  42 seconds Radiation Exposure Index (if provided by the fluoroscopic device): 23.20 mGy Number of Acquired Spot Images: 0 COMPARISON:  None. FINDINGS: Limited study due to patient's limited mobility. The  esophagus was smooth in contour. The esophagus demonstrates very poor motility, with stasis of contrast, prominent tertiary contractions and reflux of contrast toward the hypopharynx. There was no narrowing or stricture. There was no hiatal hernia. IMPRESSION: Poor motility of the esophagus with prominent tertiary contractions and reflux of contrast toward the hypopharynx. Electronically Signed   By: Fidela Salisbury M.D.   On: 09/19/2018 13:48    Scheduled Meds:  amLODipine  10 mg Oral Daily   budesonide (PULMICORT) nebulizer solution  0.5 mg Nebulization BID   chlorhexidine gluconate (MEDLINE KIT)  15 mL Mouth  Rinse BID   docusate sodium  100 mg Oral Daily   feeding supplement (ENSURE ENLIVE)  237 mL Oral BID BM   feeding supplement (PRO-STAT SUGAR FREE 64)  30 mL Oral Daily   folic acid  0.5 mg Oral Daily   heparin  5,000 Units Subcutaneous Q8H   hydrALAZINE  50 mg Oral Q6H   insulin aspart  0-15 Units Subcutaneous Q4H   ipratropium-albuterol  3 mL Nebulization TID   iron polysaccharides  150 mg Oral BID   isosorbide mononitrate  30 mg Oral Daily   montelukast  10 mg Oral QHS   multivitamin  1 tablet Oral Daily   nutrition supplement (JUVEN)  1 packet Oral BID BM   omega-3 acid ethyl esters  1 g Oral Daily   pantoprazole  40 mg Oral Daily   pyridOXINE  100 mg Oral Daily   senna  1 tablet Oral BID   simvastatin  20 mg Oral Daily   torsemide  20 mg Oral BID   traZODone  50 mg Oral QHS   vitamin B-12  1,000 mcg Oral Daily   vitamin E  400 Units Oral Daily   Continuous Infusions:  piperacillin-tazobactam (ZOSYN)  IV       LOS: 17 days   Marylu Lund, MD [facility] Pager On Amion  If 7PM-7AM, please contact night-coverage 09/20/2018, 5:49 PM

## 2018-09-20 NOTE — Progress Notes (Signed)
Pharmacy Antibiotic Note  [name] is a 78 y.o. female admitted on 09/03/2018 with aspiration PNA.  Pharmacy has been consulted for piperacillin/tazobactam dosing.  Today, 09/20/18  SCr 2.4, CrCl 21 mL/min  Afebrile  Plan:  Piperacillin/tazobactam 3.375 g IV q8h EI  SCr with AM labs tomorrow  Follow renal function for any necessary dose adjustment  Height: 5\' 5"  (165.1 cm) Weight: 175 lb 0.7 oz (79.4 kg) IBW/kg (Calculated) : 57  Temp (24hrs), Avg:98.2 F (36.8 C), Min:97.6 F (36.4 C), Max:98.6 F (37 C)  Recent Labs  Lab 09/14/18 0418 09/15/18 0448 09/16/18 0500 09/17/18 0437 09/18/18 0441 09/19/18 0320  WBC 13.6* 13.5* 10.3 13.8* 11.3*  --   CREATININE 2.83* 2.78* 1.96* 2.36* 2.37* 2.36*    Estimated Creatinine Clearance: 21.1 mL/min (A) (by C-G formula based on SCr of 2.36 mg/dL (H)).    Allergies  Allergen Reactions  . Other Other (See Comments)    "all generics make her sick"   Antimicrobials this admission: Piperacillin/tazobactam 9/10 >>   Dose adjustments this admission:  Microbiology results: SARS-2 Negative x2  Thank you for allowing pharmacy to be a part of this patient's care.  Lenis Noon, PharmD 09/20/2018 3:10 PM

## 2018-09-21 LAB — BASIC METABOLIC PANEL
Anion gap: 15 (ref 5–15)
BUN: 117 mg/dL — ABNORMAL HIGH (ref 8–23)
CO2: 30 mmol/L (ref 22–32)
Calcium: 8.9 mg/dL (ref 8.9–10.3)
Chloride: 94 mmol/L — ABNORMAL LOW (ref 98–111)
Creatinine, Ser: 2.74 mg/dL — ABNORMAL HIGH (ref 0.44–1.00)
GFR calc Af Amer: 19 mL/min — ABNORMAL LOW (ref >60)
GFR calc non Af Amer: 16 mL/min — ABNORMAL LOW (ref >60)
Glucose, Bld: 199 mg/dL — ABNORMAL HIGH (ref 70–99)
Potassium: 4.3 mmol/L (ref 3.5–5.1)
Sodium: 139 mmol/L (ref 135–145)

## 2018-09-21 LAB — CBC
HCT: 32 % — ABNORMAL LOW (ref 36.0–46.0)
Hemoglobin: 9.8 g/dL — ABNORMAL LOW (ref 12.0–15.0)
MCH: 28.8 pg (ref 26.0–34.0)
MCHC: 30.6 g/dL (ref 30.0–36.0)
MCV: 94.1 fL (ref 80.0–100.0)
Platelets: 270 10*3/uL (ref 150–400)
RBC: 3.4 MIL/uL — ABNORMAL LOW (ref 3.87–5.11)
RDW: 16.9 % — ABNORMAL HIGH (ref 11.5–15.5)
WBC: 11.9 10*3/uL — ABNORMAL HIGH (ref 4.0–10.5)
nRBC: 0 % (ref 0.0–0.2)

## 2018-09-21 LAB — GLUCOSE, CAPILLARY
Glucose-Capillary: 163 mg/dL — ABNORMAL HIGH (ref 70–99)
Glucose-Capillary: 168 mg/dL — ABNORMAL HIGH (ref 70–99)
Glucose-Capillary: 191 mg/dL — ABNORMAL HIGH (ref 70–99)
Glucose-Capillary: 300 mg/dL — ABNORMAL HIGH (ref 70–99)

## 2018-09-21 MED ORDER — AMOXICILLIN-POT CLAVULANATE 875-125 MG PO TABS: 1.0000 | ORAL_TABLET | Freq: Two times a day (BID) | ORAL | 0 refills | Status: AC

## 2018-09-21 MED ORDER — HYDRALAZINE HCL 50 MG PO TABS: 50.0000 mg | ORAL_TABLET | Freq: Four times a day (QID) | ORAL | 0 refills | Status: DC

## 2018-09-21 MED ORDER — ISOSORBIDE MONONITRATE ER 30 MG PO TB24: 30.0000 mg | ORAL_TABLET | Freq: Every day | ORAL | 0 refills | Status: DC

## 2018-09-21 MED ORDER — SENNA 8.6 MG PO TABS: 1.0000 | ORAL_TABLET | Freq: Two times a day (BID) | ORAL | 0 refills | Status: DC

## 2018-09-21 MED ORDER — OXYCODONE-ACETAMINOPHEN 5-325 MG PO TABS: 1.0000 | ORAL_TABLET | Freq: Four times a day (QID) | ORAL | 0 refills | Status: DC | PRN

## 2018-09-21 MED ORDER — DOCUSATE SODIUM 100 MG PO CAPS: 100.0000 mg | ORAL_CAPSULE | Freq: Every day | ORAL | 0 refills | Status: DC

## 2018-09-21 MED ORDER — HYDROCOD POLST-CPM POLST ER 10-8 MG/5ML PO SUER
5.0000 mL | Freq: Once | ORAL | Status: AC
  Administered 2018-09-21: 5 mL via ORAL
  Filled 2018-09-21: qty 5

## 2018-09-21 MED ORDER — PIPERACILLIN-TAZOBACTAM IN DEX 2-0.25 GM/50ML IV SOLN
2.2500 g | Freq: Three times a day (TID) | INTRAVENOUS | Status: DC
  Filled 2018-09-21 (×2): qty 50

## 2018-09-21 NOTE — Discharge Summary (Signed)
Physician Discharge Summary  [name] [facility] EPP:[id number] DOB: 1940/01/13 DOA: 09/03/2018  PCP: Garwin Brothers, MD  Admit date: 09/03/2018 Discharge date: 09/21/2018  Admitted From: SNF Disposition:  SNF  Recommendations for Outpatient Follow-up:  1. Follow up with PCP in 1-2 weeks 2. Follow up with Cardiology as scheduled 3. Please keep head of bed elevated 30 degrees or higher with aspiration precautions  Discharge Condition:Stable CODE STATUS:No CPR or defibrillation Diet recommendation: Heart healthy with thin liquids   Brief/Interim Summary: 78 year old female with a history of combined systolic and diastolic CHF, diabetes mellitus type 2, anemia, COPD, pulmonary hypertension came with shortness of breath. Cardiology was consulted and patient started on IV Lasix. Patient became hypoxic and was found to have acute respiratory with hypoxia and hypercapnia she was transferred to stepdown, initially placed on BiPAP. PCCM was consulted and patient was intubated. Patient got extubated on 09/15/2018 and transferred to floor. [facility] hospitalist service resumed care on 09/16/2018.  Discharge Diagnoses:  Principal Problem:   CHF (congestive heart failure), NYHA class IV, chronic, combined (HCC) Active Problems:   COPD with asthma ([place])   Anemia of chronic disease   Acute on chronic heart failure with preserved ejection fraction (HCC)   CKD (chronic kidney disease) stage 4, GFR 15-29 ml/min (HCC)   Type 2 diabetes mellitus (HCC)   Essential hypertension   Elevated brain natriuretic peptide (BNP) level   Troponin I above reference range   Acute on chronic combined systolic (congestive) and diastolic (congestive) heart failure (HCC)   Hyperkalemia   Acute renal failure superimposed on chronic kidney disease ([place])   Acute respiratory failure with hypoxia and hypercapnia (HCC)   Shock circulatory (HCC)   Acute respiratory failure with hypoxemia (HCC)   Acute encephalopathy  1. Acute on  chronic hypoxic respiratory failure-in setting of chronic COPD, pulmonary edema, pulmonary hypertension. Patient was extubated on 09/15/2018. Currently she is on oxygen via nasal cannula. Now on torsemide per Cardiology. 1. Recently increased respiratory effort noted after esophogram  2. CXR obtained and personally reviewed. Findings are worrisome for possible aspiration given concerns of dysphagia 3. Discussed with SLP who suggested Palliative Care. Have discussed case with Palliative Care. Pt did not wish to discuss aspiration issues at this time 4. Started pt on empiric zosyn, to complete course of augmentin on discharge  2. AKI on CKD stage IV-patient baseline creatinine is 2.3-2.5, creatinine today is 2.37. Now on torsemide per Cardiology. Avoid nephrotoxins.  3. Hypertension -blood pressure is controlled, continue amlodipine, hydralazine as tolerated.  4. Diabetes mellitus type 2-CBG fairly well controlled, continue sliding scale insulin with NovoLog. Cont home meds on d/c. Recent A1c of over 9  5. COPD- Presently stable, continue PRN bronchodilators as needed  6. Pulmonary hypertension-continue supportive care as tolerated  7. Hypokalemia -Replaced, most recent K of 4.3  8. Anemia-patient's hemoglobin is 6.9, 1 unit PRBC given this admit with repeat hemoglobin of 9.8. FOBT was positive in July 2020. She had normal EGD on 07/25/2018. Colonoscopy was not done as patient was not deemed to be a candidate for colonoscopy. Remains hemodynamically stable. Hgb remained stable  Discharge Instructions   Allergies as of 09/21/2018      Reactions   Other Other (See Comments)   "all generics make her sick"      Medication List    TAKE these medications   acetaminophen 325 MG tablet Commonly known as: TYLENOL Take 650 mg by mouth every 6 (six) hours as needed for mild pain.  amLODipine 10 MG tablet Commonly known as: NORVASC Take 10 mg by mouth daily.    amoxicillin-clavulanate 875-125 MG tablet Commonly known as: Augmentin Take 1 tablet by mouth 2 (two) times daily for 4 days.   calcitRIOL 0.25 MCG capsule Commonly known as: ROCALTROL Take 1 capsule (0.25 mcg total) by mouth every Monday, Wednesday, and Friday. What changed: when to take this   cyclobenzaprine 10 MG tablet Commonly known as: FLEXERIL Take 10 mg 3 (three) times daily as needed by mouth for muscle spasms.   docusate sodium 100 MG capsule Commonly known as: COLACE Take 1 capsule (100 mg total) by mouth daily. Start taking on: September 22, 2018   feeding supplement (GLUCERNA SHAKE) Liqd Take 237 mLs by mouth 2 (two) times daily between meals.   feeding supplement (PRO-STAT SUGAR FREE 64) Liqd Take 30 mLs by mouth 2 (two) times daily.   Fish Oil 1000 MG Caps Take 1,000 mg by mouth daily.   folic acid 397 MCG tablet Commonly known as: FOLVITE Take 400 mcg by mouth daily.   hydrALAZINE 50 MG tablet Commonly known as: APRESOLINE Take 1 tablet (50 mg total) by mouth every 6 (six) hours.   NOVOLOG FLEXPEN [place] Inject 0-10 Units into the skin See admin instructions. Per Sliding Scale ar 0600, 1100, 1600, 2100 100-150 0units 151-200 2units 201-250 4units 251-300 6units 301-350 8units 351-400 10units   insulin aspart 100 UNIT/ML injection Commonly known as: novoLOG Inject 6 Units into the skin 3 (three) times daily with meals.   insulin glargine 100 UNIT/ML injection Commonly known as: LANTUS Inject 0.15 mLs (15 Units total) into the skin at bedtime.   ipratropium-albuterol 0.5-2.5 (3) MG/3ML Soln Commonly known as: DUONEB Take 3 mLs by nebulization every 6 (six) hours as needed (SOB, wheezing).   iron polysaccharides 150 MG capsule Commonly known as: NIFEREX Take 150 mg by mouth 2 (two) times daily.   isosorbide mononitrate 30 MG 24 hr tablet Commonly known as: IMDUR Take 1 tablet (30 mg total) by mouth daily. Start taking on: September 22, 2018    magnesium oxide 400 MG tablet Commonly known as: MAG-OX Take 400 mg by mouth daily.   montelukast 10 MG tablet Commonly known as: Singulair Take 1 tablet (10 mg total) by mouth at bedtime.   MULTIVITAMIN ADULT PO Take 1 tablet by mouth daily.   multivitamin Tabs tablet Take 1 tablet by mouth daily.   nitroGLYCERIN 0.4 MG SL tablet Commonly known as: NITROSTAT Place 0.4 mg under the tongue every 5 (five) minutes as needed for chest pain.   oxyCODONE-acetaminophen 5-325 MG tablet Commonly known as: PERCOCET/ROXICET Take 1 tablet by mouth every 6 (six) hours as needed for severe pain. Prescription needed for SNF placement What changed: additional instructions   pantoprazole 40 MG tablet Commonly known as: PROTONIX Take 1 tablet (40 mg total) by mouth daily at 6 (six) AM.   pyridOXINE 100 MG tablet Commonly known as: VITAMIN B-6 Take 100 mg by mouth daily.   senna 8.6 MG Tabs tablet Commonly known as: SENOKOT Take 1 tablet (8.6 mg total) by mouth 2 (two) times daily.   Systane Balance 0.6 % Soln Generic drug: Propylene Glycol Apply 1 drop to eye 2 (two) times daily.   torsemide 10 MG tablet Commonly known as: DEMADEX Take 2 tablets (20 mg total) by mouth 2 (two) times daily.   traZODone 50 MG tablet Commonly known as: DESYREL Take 50 mg by mouth at bedtime.   vitamin B-12  1000 MCG tablet Commonly known as: CYANOCOBALAMIN Take 1,000 mcg by mouth daily.   Vitamin D (Ergocalciferol) 1.25 MG (50000 UT) Caps capsule Commonly known as: DRISDOL Take 50,000 Units by mouth 2 (two) times a week. MON and THUR   vitamin E 400 UNIT capsule Take 400 Units by mouth daily.   Zocor 20 MG tablet Generic drug: simvastatin Take 20 mg by mouth daily.      Follow-up Information    Lendon Colonel, NP. Go on 10/08/2018.   Specialties: Nurse Practitioner, Radiology, Cardiology Why: @1 :45pm for hospital follow up, please arrive 15 minutes early  Contact information: [address] [place] [address] [phone]        Garwin Brothers, MD. Schedule an appointment as soon as possible for a visit in 1 week(s).   Specialty: Internal Medicine Contact information: [address] [place] [address] [phone]        Buford Dresser, MD .   Specialty: Cardiology Contact information: [address] [place] [address] [phone]          Allergies  Allergen Reactions  . Other Other (See Comments)    "all generics make her sick"    Consultations:  Critical Care  Cardiology  Procedures/Studies: Dg Chest 1 View  Result Date: 09/11/2018 CLINICAL DATA:  New left central line EXAM: CHEST  1 VIEW COMPARISON:  09/11/2018, 11:57 a.m. FINDINGS: Interval placement of a left subclavian vascular catheter, tip projecting over the right atrium. Otherwise unchanged examination with endotracheal tube just above the carina, esophagogastric tube tip and side port below the diaphragm, cardiomegaly, pulmonary vascular prominence, retrocardiac opacity, and a possible small left pleural effusion. IMPRESSION: Interval placement of a left subclavian vascular catheter, tip projecting over the right atrium. Otherwise unchanged AP portable examination. Electronically Signed   By: Eddie Candle M.D.   On: 09/11/2018 14:03   Dg Chest 2 View  Result Date: 09/01/2018 CLINICAL DATA:  Chills and cough. EXAM: CHEST - 2 VIEW COMPARISON:  08/01/2018 FINDINGS: Low volumes. The cardio pericardial silhouette is enlarged. Pulmonary vascular congestion noted with interstitial pulmonary edema pattern. No substantial pleural effusion. The visualized bony structures of the thorax are intact. Degenerative changes noted left shoulder. IMPRESSION: Cardiomegaly with interstitial pulmonary edema. Electronically Signed   By: Misty Stanley M.D.   On: 09/01/2018 16:12   Dg Abd 1 View  Result Date: 09/11/2018 CLINICAL DATA:  ETT, OG placement EXAM: PORTABLE  CHEST 1 VIEW COMPARISON:  09/10/2018 FINDINGS: Interval placement of endotracheal tube, tip projecting above the carina. Otherwise unchanged AP portable examination of the chest with cardiomegaly, pulmonary vascular prominence, retrocardiac opacity reflecting atelectasis or consolidation, and a possible small left pleural effusion. Interval placement of orogastric tube with tip and side port below the diaphragm. Unremarkable pattern of partially imaged bowel gas. IMPRESSION: 1. Interval placement of endotracheal tube, tip projecting above the carina. 2. Otherwise unchanged AP portable examination of the chest with cardiomegaly, pulmonary vascular prominence, retrocardiac opacity reflecting atelectasis or consolidation, and a possible small left pleural effusion. 3. Interval placement of orogastric tube with tip and side port below the diaphragm. Electronically Signed   By: Eddie Candle M.D.   On: 09/11/2018 12:14   Dg Chest Port 1 View  Result Date: 09/19/2018 CLINICAL DATA:  Aspiration into airway EXAM: PORTABLE CHEST 1 VIEW COMPARISON:  09/17/2018 FINDINGS: Cardiomegaly with mild perihilar edema. Superimposed upper lobe/perihilar opacities along with left lower lobe/retrocardiac opacity, favoring multifocal pneumonia,  possibly on the basis of aspiration. No definite pleural effusions. No pneumothorax. IMPRESSION: Cardiomegaly with mild perihilar edema. Superimposed multifocal patchy opacities in the upper lobes and left lower lobe, suspicious for multifocal pneumonia, possibly on the basis of aspiration given the clinical history. Electronically Signed   By: Julian Hy M.D.   On: 09/19/2018 18:56   Dg Chest Port 1 View  Result Date: 09/17/2018 CLINICAL DATA:  Tachypnea EXAM: PORTABLE CHEST 1 VIEW COMPARISON:  09/13/2018 FINDINGS: Cardiomegaly with vascular congestion and probable mild pulmonary edema, slightly improved since prior study. No effusions or acute bony abnormality. IMPRESSION:  Cardiomegaly, vascular congestion and mild pulmonary edema, improved since prior study. Electronically Signed   By: Rolm Baptise M.D.   On: 09/17/2018 21:56   Dg Chest Port 1 View  Result Date: 09/13/2018 CLINICAL DATA:  ET position EXAM: PORTABLE CHEST 1 VIEW COMPARISON:  Radiograph 09/12/2018 FINDINGS: Patient is rotated in a steep left anterior oblique superimposing the mediastinal structures over the right chest and narrowing the left lung window. Interval retraction of the endotracheal tube now positioned approximately 2 cm from the carina. Transesophageal tube tip and side port appear distal to the GE junction, below the level of imaging. A left subclavian line terminates at the superior cavoatrial junction. Persistent bilateral airspace opacities with hazy bibasilar atelectatic change with obscuration of the hemidiaphragms possibly reflecting a small amount of layering effusion. IMPRESSION: 1. Patient is rotated in a steep left anterior oblique superimposing the mediastinal structures over the right chest and narrowing of the left lung window. 2. Interval retraction of the endotracheal tube now positioned approximately 2 cm from the carina. Could be retracted additional 1-2 cm to position in the mid trachea. These results will be called to the ordering clinician or representative by the Radiologist Assistant, and communication documented in the PACS or zVision Dashboard. Electronically Signed   By: Lovena Le M.D.   On: 09/13/2018 06:47   Dg Chest Port 1 View  Result Date: 09/12/2018 CLINICAL DATA:  Respiratory failure. EXAM: PORTABLE CHEST 1 VIEW COMPARISON:  Radiograph of September 11, 2018. FINDINGS: Stable cardiomegaly. Endotracheal tube is slightly directed toward right mainstem bronchus; withdrawal by 2-3 cm is recommended. Nasogastric tube is unchanged in position. Left subclavian catheter is unchanged. No definite pneumothorax is noted. Stable bibasilar atelectasis or infiltrates are noted.  Possible small left pleural effusion is noted. Bony thorax is unremarkable. IMPRESSION: Endotracheal tube is slightly directed toward right mainstem bronchus; withdrawal by 2-3 cm is recommended. Mild bibasilar opacities are noted concerning for atelectasis or infiltrates, with possible small left pleural effusion. These results will be called to the ordering clinician or representative by the Radiologist Assistant, and communication documented in the PACS or zVision Dashboard. Electronically Signed   By: Marijo Conception M.D.   On: 09/12/2018 08:24   Dg Chest Port 1 View  Result Date: 09/11/2018 CLINICAL DATA:  ETT, OG placement EXAM: PORTABLE CHEST 1 VIEW COMPARISON:  09/10/2018 FINDINGS: Interval placement of endotracheal tube, tip projecting above the carina. Otherwise unchanged AP portable examination of the chest with cardiomegaly, pulmonary vascular prominence, retrocardiac opacity reflecting atelectasis or consolidation, and a possible small left pleural effusion. Interval placement of orogastric tube with tip and side port below the diaphragm. Unremarkable pattern of partially imaged bowel gas. IMPRESSION: 1. Interval placement of endotracheal tube, tip projecting above the carina. 2. Otherwise unchanged AP portable examination of the chest with cardiomegaly, pulmonary vascular prominence, retrocardiac opacity reflecting atelectasis or consolidation, and a  possible small left pleural effusion. 3. Interval placement of orogastric tube with tip and side port below the diaphragm. Electronically Signed   By: Eddie Candle M.D.   On: 09/11/2018 12:14   Dg Chest Port 1 View  Result Date: 09/10/2018 CLINICAL DATA:  Hypoxia. EXAM: PORTABLE CHEST 1 VIEW COMPARISON:  09/07/2018 FINDINGS: Patient's chin obscures the apices. Unchanged cardiomegaly. Pulmonary edema appears similar. Worsening retrocardiac and right lung base opacity. No pneumothorax. Degenerative change of the shoulders. IMPRESSION: 1. Worsening  retrocardiac and right lung base opacity which may represent atelectasis and/or pleural fluid, particularly on the left. 2. Similar cardiomegaly and pulmonary edema. Electronically Signed   By: Keith Rake M.D.   On: 09/10/2018 23:03   Dg Chest Port 1 View  Result Date: 09/07/2018 CLINICAL DATA:  Dyspnea. EXAM: PORTABLE CHEST 1 VIEW COMPARISON:  Chest x-ray dated September 03, 2018. FINDINGS: Unchanged cardiomegaly. Diffuse interstitial and hazy airspace opacities have mildly worsened. No pleural effusion or pneumothorax. No acute osseous abnormality. IMPRESSION: 1. Worsening pulmonary edema. Electronically Signed   By: Titus Dubin M.D.   On: 09/07/2018 13:51   Dg Chest Port 1 View  Result Date: 09/03/2018 CLINICAL DATA:  Shortness of breath and wheezing EXAM: PORTABLE CHEST 1 VIEW COMPARISON:  Two days ago FINDINGS: Cardiomegaly with perihilar vascular congestion. Lung volumes are low. No effusion, Kerley lines, or pneumothorax. IMPRESSION: Cardiomegaly and vascular congestion with possible perihilar edema. Electronically Signed   By: Monte Fantasia M.D.   On: 09/03/2018 04:14   Vas [place] Upper Extremity Venous Duplex  Result Date: 09/04/2018 UPPER VENOUS STUDY  Indications: Edema Left forearm and arm Limitations: Body habitus and forearm edema. Comparison Study: No previous study available for comparison Performing Technologist: Toma Copier RVS  Examination Guidelines: A complete evaluation includes B-mode imaging, spectral Doppler, color Doppler, and power Doppler as needed of all accessible portions of each vessel. Bilateral testing is considered an integral part of a complete examination. Limited examinations for reoccurring indications may be performed as noted.  Right Findings: +----------+------------+---------+-----------+----------+-------+ RIGHT     CompressiblePhasicitySpontaneousPropertiesSummary +----------+------------+---------+-----------+----------+-------+  Subclavian    Full       Yes       Yes                      +----------+------------+---------+-----------+----------+-------+  Left Findings: +----------+------------+---------+-----------+----------+---------------------+ LEFT      CompressiblePhasicitySpontaneousProperties       Summary        +----------+------------+---------+-----------+----------+---------------------+ IJV           Full       Yes       Yes                                    +----------+------------+---------+-----------+----------+---------------------+ Subclavian    Full       Yes       Yes                                    +----------+------------+---------+-----------+----------+---------------------+ Axillary      Full       Yes       Yes                                    +----------+------------+---------+-----------+----------+---------------------+ Brachial  Full       Yes       Yes                                    +----------+------------+---------+-----------+----------+---------------------+ Radial        Full                                                        +----------+------------+---------+-----------+----------+---------------------+ Ulnar         Full                                                        +----------+------------+---------+-----------+----------+---------------------+ Cephalic    Partial                                 Acute in the [facility] into                                                        the mid upper arm   +----------+------------+---------+-----------+----------+---------------------+ Basilic                                              Unable to visualize                                                       the forearm due to                                                       edema. The upper arm                                                           is negative       +----------+------------+---------+-----------+----------+---------------------+  Summary:  Right: No evidence of thrombosis in the subclavian.  Left: No evidence of deep vein thrombosis in the upper extremity. No evidence of thrombosis in the subclavian. Findings consistent with acute superficial vein thrombosis involving the left cephalic vein.  *See table(s) above for measurements and observations.  Diagnosing physician: Deitra Mayo MD Electronically signed by Deitra Mayo MD on 09/04/2018 at 1:22:51 PM.    Final    Dg Esophagus W Single Cm (sol Or Thin  Ba)  Result Date: 09/19/2018 CLINICAL DATA:  Difficulty swallowing. Choking sensation on solids. EXAM: ESOPHOGRAM/BARIUM SWALLOW TECHNIQUE: Single contrast examination was performed using  thin barium. FLUOROSCOPY TIME:  Fluoroscopy Time:  42 seconds Radiation Exposure Index (if provided by the fluoroscopic device): 23.20 mGy Number of Acquired Spot Images: 0 COMPARISON:  None. FINDINGS: Limited study due to patient's limited mobility. The esophagus was smooth in contour. The esophagus demonstrates very poor motility, with stasis of contrast, prominent tertiary contractions and reflux of contrast toward the hypopharynx. There was no narrowing or stricture. There was no hiatal hernia. IMPRESSION: Poor motility of the esophagus with prominent tertiary contractions and reflux of contrast toward the hypopharynx. Electronically Signed   By: Fidela Salisbury M.D.   On: 09/19/2018 13:48     Subjective: Eager to go to rehab  Discharge Exam: Vitals:   09/21/18 0933 09/21/18 1010  BP: (!) 142/72 (!) 147/75  Pulse:  79  Resp:    Temp:    SpO2:  94%   Vitals:   09/21/18 0802 09/21/18 0841 09/21/18 0933 09/21/18 1010  BP:   (!) 142/72 (!) 147/75  Pulse:    79  Resp:      Temp:      TempSrc:      SpO2: 90% 94%  94%  Weight:      Height:        General: Pt is alert, awake, not in acute distress Cardiovascular: RRR, S1/S2 +, no  rubs, no gallops Respiratory: CTA bilaterally, no wheezing, no rhonchi Abdominal: Soft, NT, ND, bowel sounds + Extremities: no edema, no cyanosis   The results of significant diagnostics from this hospitalization (including imaging, microbiology, ancillary and laboratory) are listed below for reference.     Microbiology: Recent Results (from the past 240 hour(s))  Novel Coronavirus, NAA (hospital order; send-out to ref lab)     Status: None   Collection Time: 09/18/18 12:08 PM   Specimen: Nasopharyngeal Swab; Respiratory  Result Value Ref Range Status   SARS-CoV-2, NAA NOT DETECTED NOT DETECTED Final    Comment: (NOTE) This nucleic acid amplification test was developed and its performance characteristics determined by [employer]. Nucleic acid amplification tests include PCR and TMA. This test has not been FDA cleared or approved. This test has been authorized by FDA under an Emergency Use Authorization (EUA). This test is only authorized for the duration of time the declaration that circumstances exist justifying the authorization of the emergency use of in vitro diagnostic tests for detection of SARS-CoV-2 virus and/or diagnosis of COVID-19 infection under section 564(b)(1) of the Act, 21 U.S.C. 979GXQ-1(J) (1), unless the authorization is terminated or revoked sooner. When diagnostic testing is negative, the possibility of a false negative result should be considered in the context of a patient's recent exposures and the presence of clinical signs and symptoms consistent with COVID-19. An individual without symptoms of COVID- 19 and who is not shedding SARS-CoV-2 vi rus would expect to have a negative (not detected) result in this assay. Performed At: [facility] [place], [place] [id number] Rush Farmer MD GY:[id number]    [facility]  Final    Comment: Performed at [facility] [address], [place], [place] [address]     Labs: BNP (last 3 results) Recent Labs    09/03/18 0429 09/18/18 0320 09/18/18 0441  BNP 696.0* 551.8* 588.5*   Basic Metabolic Panel: Recent Labs  Lab 09/16/18 0500 09/17/18 0437 09/18/18 0441  09/19/18 0320 09/21/18 0346  NA 147* 144 142 142 139  K 2.7* 4.1 4.0 4.1 4.3  CL 105 97* 97* 98 94*  CO2 29 32 31 34* 30  GLUCOSE 121* 140* 118* 97 199*  BUN 111* 125* 115* 117* 117*  CREATININE 1.96* 2.36* 2.37* 2.36* 2.74*  CALCIUM 7.4* 9.2 9.0 9.2 8.9  MG 2.0  --   --   --   --   PHOS 2.9  --   --   --   --    Liver Function Tests: No results for input(s): AST, ALT, ALKPHOS, BILITOT, PROT, ALBUMIN in the last 168 hours. No results for input(s): LIPASE, AMYLASE in the last 168 hours. No results for input(s): AMMONIA in the last 168 hours. CBC: Recent Labs  Lab 09/15/18 0448 09/16/18 0500 09/16/18 1400 09/17/18 0437 09/18/18 0441 09/21/18 0346  WBC 13.5* 10.3  --  13.8* 11.3* 11.9*  HGB 8.4* 6.9* 9.0* 9.8* 9.7* 9.8*  HCT 28.0* 23.0* 29.0* 31.6* 31.9* 32.0*  MCV 97.2 96.6  --  93.2 93.0 94.1  PLT 215 179  --  243 243 270   Cardiac Enzymes: No results for input(s): CKTOTAL, CKMB, CKMBINDEX, TROPONINI in the last 168 hours. BNP: Invalid input(s): POCBNP CBG: Recent Labs  Lab 09/20/18 2113 09/21/18 0019 09/21/18 0341 09/21/18 0753 09/21/18 1147  GLUCAP 191* 300* 191* 168* 163*   D-Dimer No results for input(s): DDIMER in the last 72 hours. Hgb A1c No results for input(s): HGBA1C in the last 72 hours. Lipid Profile No results for input(s): CHOL, HDL, LDLCALC, TRIG, CHOLHDL, LDLDIRECT in the last 72 hours. Thyroid function studies No results for input(s): TSH, T4TOTAL, T3FREE, THYROIDAB in the last 72 hours.  Invalid input(s): FREET3 Anemia work up No results for input(s): VITAMINB12, FOLATE, FERRITIN, TIBC, IRON, RETICCTPCT in the last 72 hours. Urinalysis    Component Value Date/Time   COLORURINE STRAW (A) 07/23/2018  1656   APPEARANCEUR CLEAR 07/23/2018 1656   LABSPEC 1.011 07/23/2018 1656   PHURINE 5.0 07/23/2018 1656   GLUCOSEU NEGATIVE 07/23/2018 1656   HGBUR NEGATIVE 07/23/2018 1656   BILIRUBINUR NEGATIVE 07/23/2018 1656   KETONESUR NEGATIVE 07/23/2018 1656   PROTEINUR NEGATIVE 07/23/2018 1656   UROBILINOGEN 0.2 12/16/2011 1717   NITRITE NEGATIVE 07/23/2018 1656   LEUKOCYTESUR NEGATIVE 07/23/2018 1656   Sepsis Labs Invalid input(s): PROCALCITONIN,  WBC,  LACTICIDVEN Microbiology Recent Results (from the past 240 hour(s))  Novel Coronavirus, NAA (hospital order; send-out to ref lab)     Status: None   Collection Time: 09/18/18 12:08 PM   Specimen: Nasopharyngeal Swab; Respiratory  Result Value Ref Range Status   SARS-CoV-2, NAA NOT DETECTED NOT DETECTED Final    Comment: (NOTE) This nucleic acid amplification test was developed and its performance characteristics determined by [employer]. Nucleic acid amplification tests include PCR and TMA. This test has not been FDA cleared or approved. This test has been authorized by FDA under an Emergency Use Authorization (EUA). This test is only authorized for the duration of time the declaration that circumstances exist justifying the authorization of the emergency use of in vitro diagnostic tests for detection of SARS-CoV-2 virus and/or diagnosis of COVID-19 infection under section 564(b)(1) of the Act, 21 U.S.C. 177LTJ-0(Z) (1), unless the authorization is terminated or revoked sooner. When diagnostic testing is negative, the possibility of a false negative result should be considered in the context of a patient's recent exposures and the presence of clinical signs and symptoms consistent with COVID-19.  An individual without symptoms of COVID- 19 and who is not shedding SARS-CoV-2 vi rus would expect to have a negative (not detected) result in this assay. Performed At: [facility] [place], [place]  [id number] Rush Farmer MD WU:[id number]    [place]  Final    Comment: Performed at [facility] [address] [place], [place] [address]   Time spent: 55min  SIGNED:   Marylu Lund, MD  [facility] 09/21/2018, 12:09 PM  If 7PM-7AM, please contact night-coverage

## 2018-09-21 NOTE — Progress Notes (Signed)
Physical Therapy Treatment Patient Details Name: [name] MRN: [id number] DOB: [birth year] Today's Date: 09/21/2018    History of Present Illness 78 yo female admitted from [facility] with shortness of breath, CXR reveals pulmonary edema consistent with heart failure. 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 Pt required mechanincal ventilation 9/1-9/4.    PT Comments    Supine to sit with mod assist, pt sat at edge of bed x 18 minutes. Attempted sit to stand, however pt unable to tolerate weightbearing on her feet 2* wounds and was unable to stand. Performed seated BLE/UE strengthening exercises.  Pt reported dizziness initially upon sitting, BP 147/75 in sitting, HR 79, SaO2 94% on 2L O2. 3/4 dyspnea with activity.   Follow Up Recommendations  SNF;Supervision/Assistance - 24 hour     Equipment Recommendations  None recommended by PT    Recommendations for Other Services       Precautions / Restrictions Precautions Precautions: Fall Precaution Comments: monitor sats Restrictions Weight Bearing Restrictions: No Other Position/Activity Restrictions: pt unable to tolerate WB R foot 2* wounds    Mobility  Bed Mobility Overal bed mobility: Needs Assistance Bed Mobility: Supine to Sit;Sit to Supine     Supine to sit: Mod assist Sit to supine: Mod assist   General bed mobility comments: increased time and effort, assist for scooting out to EOB, pt utilized bed rail to self assist, pt fatigued end of session and required assist for LEs onto bed; pt sat edge of bed x 18 minutes, 3/4 dyspnea, Sa O2 94% on 2L O2, dizzy initially upon sitting -this resolved after ~2 minutes, sitting BP 147/75  Transfers Overall transfer level: Needs assistance Equipment used: Rolling walker (2 wheeled) Transfers: Sit to/from Stand Sit to Stand: Total assist;+2 physical assistance          General transfer comment: attempted however pt unable, requiring increased assist and reports pain in her feet with attempted weight bearing  Ambulation/Gait                 Stairs             Wheelchair Mobility    Modified Rankin (Stroke Patients Only)       Balance Overall balance assessment: Needs assistance Sitting-balance support: No upper extremity supported;Feet supported Sitting balance-Leahy Scale: Fair Sitting balance - Comments: pt sat Edge of bed ~18 minutes                                    Cognition Arousal/Alertness: Awake/alert Behavior During Therapy: WFL for tasks assessed/performed Overall Cognitive Status: Within Functional Limits for tasks assessed                                        Exercises General Exercises - Lower Extremity Long Arc Quad: AROM;Both;10 reps;Seated Hip Flexion/Marching: AROM;Both;10 reps;Seated Shoulder Exercises Shoulder Flexion: AAROM;Left;10 reps;Seated;AROM;Right(AAROM L, AROM R)    General Comments        Pertinent Vitals/Pain Pain Assessment: Faces Faces Pain Scale: Hurts even more Pain Location: R foot with attempted weight bearing Pain Descriptors / Indicators: Sore Pain Intervention(s): Limited activity within patient's tolerance;Monitored during session;Premedicated before session;Repositioned    Home Living  Prior Function            PT Goals (current goals can now be found in the care plan section) Acute Rehab PT Goals Patient Stated Goal: to be able to get out of bed PT Goal Formulation: With patient Time For Goal Achievement: 09/29/18 Potential to Achieve Goals: Good Progress towards PT goals: Progressing toward goals    Frequency    Min 2X/week      PT Plan Current plan remains appropriate    Co-evaluation              AM-PAC PT "6 Clicks" Mobility   Outcome Measure  Help needed turning from your back  to your side while in a flat bed without using bedrails?: A Little Help needed moving from lying on your back to sitting on the side of a flat bed without using bedrails?: A Lot Help needed moving to and from a bed to a chair (including a wheelchair)?: Total Help needed standing up from a chair using your arms (e.g., wheelchair or bedside chair)?: Total Help needed to walk in hospital room?: Total Help needed climbing 3-5 steps with a railing? : Total 6 Click Score: 9    End of Session Equipment Utilized During Treatment: Oxygen;Gait belt Activity Tolerance: Patient limited by fatigue Patient left: in bed;with call bell/phone within reach;with nursing/sitter in room(tech in room to assist with pericare, pt soiled with BM) Nurse Communication: Mobility status PT Visit Diagnosis: Muscle weakness (generalized) (M62.81);Difficulty in walking, not elsewhere classified (R26.2)     Time: 6270-3500 PT Time Calculation (min) (ACUTE ONLY): 30 min  Charges:  $Therapeutic Exercise: 8-22 mins $Therapeutic Activity: 8-22 mins                    Blondell Reveal Kistler PT 09/21/2018  Acute Rehabilitation Services Pager [phone] Office [phone]

## 2018-09-21 NOTE — Progress Notes (Signed)
Pt to be discharged to [facility] this afternoon. Report called and Suella Grove RN accepting report for this facility

## 2018-09-21 NOTE — TOC Transition Note (Signed)
Transition of Care [facility]) - CM/SW Discharge Note   Patient Details  Name: [name] MRN: [id number] Date of Birth: [birth year]  [facility] CM/SW Contact:  Wende Neighbors, LCSW Phone Number: 09/21/2018, 1:28 PM   Clinical Narrative:   Patient has bed at [facility] and will discharge via [facility]. RN to please call [phone] (rm# [id number]) for report.    Final next level of care: Skilled Nursing Facility Barriers to Discharge: No Barriers Identified   Patient Goals and CMS Choice Patient states their goals for this hospitalization and ongoing recovery are:: to get better      Discharge Placement              Patient chooses bed at: [facility] Patient to be transferred to facility by: ptar Name of family member notified: patient aware Patient and family notified of of transfer: 09/21/18  Discharge Plan and Services   Discharge Planning Services: CM Consult                                 Social Determinants of Health ([place]) Interventions     Readmission Risk Interventions Readmission Risk Prevention Plan 07/10/2018 06/18/2018  Transportation Screening - Complete  PCP or Specialist Appt within 3-5 Days Complete Not Complete  Not Complete comments - not yet ready for d/c  HRI or [place] Complete Not Complete  HRI or Home Care Consult comments - CM will follow for needs  Social Work Consult for [place] Planning/Counseling Complete Complete  Palliative Care Screening Not Applicable Not Applicable  Medication Review Press photographer) - Complete  Some recent data might be hidden

## 2018-09-21 NOTE — Progress Notes (Signed)
Pt is having dry cough continuously, On call provider is paged and orders received.

## 2018-09-21 NOTE — Progress Notes (Signed)
Pharmacy Antibiotic Note  Nicole WALTERMIRE is a 78 y.o. female admitted on 09/03/2018 with aspiration PNA.  Pharmacy has been consulted for piperacillin/tazobactam dosing.  Today, 09/21/18  SCr 2.7, CrCl 18 mL/min. Baseline SCr 2.3-2.5  Afebrile  Plan:  Decrease antibiotic dose to piperacillin/tazobactam 2.25 g IV q8h  SCr with AM labs tomorrow  Follow renal function for any necessary dose adjustment  Height: 5\' 5"  (165.1 cm) Weight: 176 lb 5.9 oz (80 kg) IBW/kg (Calculated) : 57  Temp (24hrs), Avg:98.1 F (36.7 C), Min:97.6 F (36.4 C), Max:98.7 F (37.1 C)  Recent Labs  Lab 09/15/18 0448 09/16/18 0500 09/17/18 0437 09/18/18 0441 09/19/18 0320 09/21/18 0346  WBC 13.5* 10.3 13.8* 11.3*  --  11.9*  CREATININE 2.78* 1.96* 2.36* 2.37* 2.36* 2.74*    Estimated Creatinine Clearance: 18.3 mL/min (A) (by C-G formula based on SCr of 2.74 mg/dL (H)).    Allergies  Allergen Reactions  . Other Other (See Comments)    "all generics make her sick"   Antimicrobials this admission: Piperacillin/tazobactam 9/10 >>   Dose adjustments this admission: 9/11 ZEI --> 2.25 g IV q8h  Microbiology results: SARS-2 Negative x2  Thank you for allowing pharmacy to be a part of this patient's care.  Lenis Noon, PharmD 09/21/2018 9:30 AM

## 2018-10-04 ENCOUNTER — Inpatient Hospital Stay (HOSPITAL_COMMUNITY)
Admission: EM | Admit: 2018-10-04 | Discharge: 2018-10-13 | DRG: 291 | Disposition: A | Discharge status: Hospice | Payer: Medicare Other | Attending: Internal Medicine | Admitting: Internal Medicine

## 2018-10-04 ENCOUNTER — Emergency Department (HOSPITAL_COMMUNITY): Payer: Medicare Other

## 2018-10-04 ENCOUNTER — Other Ambulatory Visit: Payer: Self-pay

## 2018-10-04 ENCOUNTER — Encounter (HOSPITAL_COMMUNITY): Payer: Self-pay | Admitting: Emergency Medicine

## 2018-10-04 DIAGNOSIS — F329 Major depressive disorder, single episode, unspecified: Secondary | ICD-10-CM | POA: Diagnosis present

## 2018-10-04 DIAGNOSIS — J9621 Acute and chronic respiratory failure with hypoxia: Secondary | ICD-10-CM | POA: Diagnosis present

## 2018-10-04 DIAGNOSIS — Z8711 Personal history of peptic ulcer disease: Secondary | ICD-10-CM

## 2018-10-04 DIAGNOSIS — T502X5A Adverse effect of carbonic-anhydrase inhibitors, benzothiadiazides and other diuretics, initial encounter: Secondary | ICD-10-CM | POA: Diagnosis not present

## 2018-10-04 DIAGNOSIS — E1122 Type 2 diabetes mellitus with diabetic chronic kidney disease: Secondary | ICD-10-CM | POA: Diagnosis present

## 2018-10-04 DIAGNOSIS — R570 Cardiogenic shock: Secondary | ICD-10-CM | POA: Diagnosis present

## 2018-10-04 DIAGNOSIS — N186 End stage renal disease: Secondary | ICD-10-CM | POA: Diagnosis present

## 2018-10-04 DIAGNOSIS — J189 Pneumonia, unspecified organism: Secondary | ICD-10-CM | POA: Diagnosis present

## 2018-10-04 DIAGNOSIS — E7800 Pure hypercholesterolemia, unspecified: Secondary | ICD-10-CM | POA: Diagnosis present

## 2018-10-04 DIAGNOSIS — I132 Hypertensive heart and chronic kidney disease with heart failure and with stage 5 chronic kidney disease, or end stage renal disease: Principal | ICD-10-CM | POA: Diagnosis present

## 2018-10-04 DIAGNOSIS — J440 Chronic obstructive pulmonary disease with acute lower respiratory infection: Secondary | ICD-10-CM | POA: Diagnosis present

## 2018-10-04 DIAGNOSIS — E875 Hyperkalemia: Secondary | ICD-10-CM | POA: Diagnosis present

## 2018-10-04 DIAGNOSIS — E11621 Type 2 diabetes mellitus with foot ulcer: Secondary | ICD-10-CM | POA: Diagnosis present

## 2018-10-04 DIAGNOSIS — K219 Gastro-esophageal reflux disease without esophagitis: Secondary | ICD-10-CM | POA: Diagnosis present

## 2018-10-04 DIAGNOSIS — E119 Type 2 diabetes mellitus without complications: Secondary | ICD-10-CM

## 2018-10-04 DIAGNOSIS — N184 Chronic kidney disease, stage 4 (severe): Secondary | ICD-10-CM | POA: Diagnosis not present

## 2018-10-04 DIAGNOSIS — Z794 Long term (current) use of insulin: Secondary | ICD-10-CM

## 2018-10-04 DIAGNOSIS — I890 Lymphedema, not elsewhere classified: Secondary | ICD-10-CM | POA: Diagnosis present

## 2018-10-04 DIAGNOSIS — J449 Chronic obstructive pulmonary disease, unspecified: Secondary | ICD-10-CM | POA: Diagnosis present

## 2018-10-04 DIAGNOSIS — R5381 Other malaise: Secondary | ICD-10-CM | POA: Diagnosis present

## 2018-10-04 DIAGNOSIS — J9602 Acute respiratory failure with hypercapnia: Secondary | ICD-10-CM

## 2018-10-04 DIAGNOSIS — Z7189 Other specified counseling: Secondary | ICD-10-CM | POA: Diagnosis not present

## 2018-10-04 DIAGNOSIS — I5033 Acute on chronic diastolic (congestive) heart failure: Secondary | ICD-10-CM | POA: Diagnosis present

## 2018-10-04 DIAGNOSIS — I313 Pericardial effusion (noninflammatory): Secondary | ICD-10-CM | POA: Diagnosis present

## 2018-10-04 DIAGNOSIS — I959 Hypotension, unspecified: Secondary | ICD-10-CM | POA: Diagnosis not present

## 2018-10-04 DIAGNOSIS — Z20828 Contact with and (suspected) exposure to other viral communicable diseases: Secondary | ICD-10-CM | POA: Diagnosis present

## 2018-10-04 DIAGNOSIS — J4489 Other specified chronic obstructive pulmonary disease: Secondary | ICD-10-CM | POA: Diagnosis present

## 2018-10-04 DIAGNOSIS — L97529 Non-pressure chronic ulcer of other part of left foot with unspecified severity: Secondary | ICD-10-CM | POA: Diagnosis present

## 2018-10-04 DIAGNOSIS — E669 Obesity, unspecified: Secondary | ICD-10-CM | POA: Diagnosis present

## 2018-10-04 DIAGNOSIS — N179 Acute kidney failure, unspecified: Secondary | ICD-10-CM

## 2018-10-04 DIAGNOSIS — Z66 Do not resuscitate: Secondary | ICD-10-CM | POA: Diagnosis present

## 2018-10-04 DIAGNOSIS — Z96653 Presence of artificial knee joint, bilateral: Secondary | ICD-10-CM | POA: Diagnosis present

## 2018-10-04 DIAGNOSIS — M1990 Unspecified osteoarthritis, unspecified site: Secondary | ICD-10-CM | POA: Diagnosis present

## 2018-10-04 DIAGNOSIS — J9622 Acute and chronic respiratory failure with hypercapnia: Secondary | ICD-10-CM | POA: Diagnosis present

## 2018-10-04 DIAGNOSIS — J9601 Acute respiratory failure with hypoxia: Secondary | ICD-10-CM | POA: Diagnosis not present

## 2018-10-04 DIAGNOSIS — N185 Chronic kidney disease, stage 5: Secondary | ICD-10-CM | POA: Diagnosis not present

## 2018-10-04 DIAGNOSIS — N170 Acute kidney failure with tubular necrosis: Secondary | ICD-10-CM | POA: Diagnosis not present

## 2018-10-04 DIAGNOSIS — J441 Chronic obstructive pulmonary disease with (acute) exacerbation: Secondary | ICD-10-CM | POA: Diagnosis present

## 2018-10-04 DIAGNOSIS — L97519 Non-pressure chronic ulcer of other part of right foot with unspecified severity: Secondary | ICD-10-CM | POA: Diagnosis present

## 2018-10-04 DIAGNOSIS — Z86010 Personal history of colonic polyps: Secondary | ICD-10-CM

## 2018-10-04 DIAGNOSIS — Z9289 Personal history of other medical treatment: Secondary | ICD-10-CM

## 2018-10-04 DIAGNOSIS — Z79899 Other long term (current) drug therapy: Secondary | ICD-10-CM

## 2018-10-04 DIAGNOSIS — Z87891 Personal history of nicotine dependence: Secondary | ICD-10-CM

## 2018-10-04 DIAGNOSIS — Z515 Encounter for palliative care: Secondary | ICD-10-CM | POA: Diagnosis present

## 2018-10-04 DIAGNOSIS — R0602 Shortness of breath: Secondary | ICD-10-CM | POA: Diagnosis present

## 2018-10-04 DIAGNOSIS — G4733 Obstructive sleep apnea (adult) (pediatric): Secondary | ICD-10-CM | POA: Diagnosis present

## 2018-10-04 DIAGNOSIS — Z6832 Body mass index (BMI) 32.0-32.9, adult: Secondary | ICD-10-CM

## 2018-10-04 DIAGNOSIS — Z888 Allergy status to other drugs, medicaments and biological substances status: Secondary | ICD-10-CM

## 2018-10-04 LAB — POCT I-STAT 7, (LYTES, BLD GAS, ICA,H+H)
Bicarbonate: 29.3 mmol/L — ABNORMAL HIGH (ref 20.0–28.0)
Calcium, Ion: 1.31 mmol/L (ref 1.15–1.40)
HCT: 30 % — ABNORMAL LOW (ref 36.0–46.0)
Hemoglobin: 10.2 g/dL — ABNORMAL LOW (ref 12.0–15.0)
O2 Saturation: 88 %
Patient temperature: 97.5
Potassium: 6.2 mmol/L — ABNORMAL HIGH (ref 3.5–5.1)
Sodium: 143 mmol/L (ref 135–145)
TCO2: 32 mmol/L (ref 22–32)
pCO2 arterial: 76 mmHg (ref 32.0–48.0)
pH, Arterial: 7.19 — CL (ref 7.350–7.450)
pO2, Arterial: 69 mmHg — ABNORMAL LOW (ref 83.0–108.0)

## 2018-10-04 LAB — COMPREHENSIVE METABOLIC PANEL
ALT: 140 U/L — ABNORMAL HIGH (ref 0–44)
AST: 187 U/L — ABNORMAL HIGH (ref 15–41)
Albumin: 3.1 g/dL — ABNORMAL LOW (ref 3.5–5.0)
Alkaline Phosphatase: 92 U/L (ref 38–126)
Anion gap: 13 (ref 5–15)
BUN: 140 mg/dL — ABNORMAL HIGH (ref 8–23)
CO2: 23 mmol/L (ref 22–32)
Calcium: 8.9 mg/dL (ref 8.9–10.3)
Chloride: 107 mmol/L (ref 98–111)
Creatinine, Ser: 4.42 mg/dL — ABNORMAL HIGH (ref 0.44–1.00)
GFR calc Af Amer: 11 mL/min — ABNORMAL LOW (ref >60)
GFR calc non Af Amer: 9 mL/min — ABNORMAL LOW (ref >60)
Glucose, Bld: 179 mg/dL — ABNORMAL HIGH (ref 70–99)
Potassium: 6.5 mmol/L (ref 3.5–5.1)
Sodium: 143 mmol/L (ref 135–145)
Total Bilirubin: 0.5 mg/dL (ref 0.3–1.2)
Total Protein: 7.6 g/dL (ref 6.5–8.1)

## 2018-10-04 LAB — BASIC METABOLIC PANEL
Anion gap: 13 (ref 5–15)
BUN: 143 mg/dL — ABNORMAL HIGH (ref 8–23)
CO2: 23 mmol/L (ref 22–32)
Calcium: 9 mg/dL (ref 8.9–10.3)
Chloride: 107 mmol/L (ref 98–111)
Creatinine, Ser: 4.7 mg/dL — ABNORMAL HIGH (ref 0.44–1.00)
GFR calc Af Amer: 10 mL/min — ABNORMAL LOW (ref >60)
GFR calc non Af Amer: 8 mL/min — ABNORMAL LOW (ref >60)
Glucose, Bld: 134 mg/dL — ABNORMAL HIGH (ref 70–99)
Potassium: 6.7 mmol/L (ref 3.5–5.1)
Sodium: 143 mmol/L (ref 135–145)

## 2018-10-04 LAB — PROCALCITONIN: Procalcitonin: 0.21 ng/mL

## 2018-10-04 LAB — CBC
HCT: 36.4 % (ref 36.0–46.0)
Hemoglobin: 10.1 g/dL — ABNORMAL LOW (ref 12.0–15.0)
MCH: 29.4 pg (ref 26.0–34.0)
MCHC: 27.7 g/dL — ABNORMAL LOW (ref 30.0–36.0)
MCV: 105.8 fL — ABNORMAL HIGH (ref 80.0–100.0)
Platelets: 232 10*3/uL (ref 150–400)
RBC: 3.44 MIL/uL — ABNORMAL LOW (ref 3.87–5.11)
RDW: 18.5 % — ABNORMAL HIGH (ref 11.5–15.5)
WBC: 7.8 10*3/uL (ref 4.0–10.5)
nRBC: 0 % (ref 0.0–0.2)

## 2018-10-04 LAB — LACTIC ACID, PLASMA: Lactic Acid, Venous: 1.4 mmol/L (ref 0.5–1.9)

## 2018-10-04 LAB — BRAIN NATRIURETIC PEPTIDE: B Natriuretic Peptide: 332.6 pg/mL — ABNORMAL HIGH (ref 0.0–100.0)

## 2018-10-04 LAB — CBG MONITORING, ED: Glucose-Capillary: 121 mg/dL — ABNORMAL HIGH (ref 70–99)

## 2018-10-04 LAB — CORTISOL: Cortisol, Plasma: 23.4 ug/dL

## 2018-10-04 LAB — GLUCOSE, CAPILLARY: Glucose-Capillary: 129 mg/dL — ABNORMAL HIGH (ref 70–99)

## 2018-10-04 LAB — SARS CORONAVIRUS 2 BY RT PCR (HOSPITAL ORDER, PERFORMED IN ~~LOC~~ HOSPITAL LAB): SARS Coronavirus 2: NEGATIVE

## 2018-10-04 MED ORDER — ACETAMINOPHEN 325 MG PO TABS
650.0000 mg | ORAL_TABLET | ORAL | Status: DC | PRN
  Administered 2018-10-10: 650 mg via ORAL
  Filled 2018-10-04: qty 2

## 2018-10-04 MED ORDER — SODIUM POLYSTYRENE SULFONATE 15 GM/60ML PO SUSP
30.0000 g | Freq: Once | ORAL | Status: AC
  Administered 2018-10-04: 23:00:00 30 g via RECTAL
  Filled 2018-10-04: qty 120

## 2018-10-04 MED ORDER — SODIUM CHLORIDE 0.9% FLUSH: 3.0000 mL | INTRAVENOUS | Status: DC | PRN

## 2018-10-04 MED ORDER — FENTANYL CITRATE (PF) 100 MCG/2ML IJ SOLN
25.0000 ug | Freq: Once | INTRAMUSCULAR | Status: AC
  Administered 2018-10-04: 20:00:00 25 ug via INTRAVENOUS
  Filled 2018-10-04: qty 2

## 2018-10-04 MED ORDER — FUROSEMIDE 10 MG/ML IJ SOLN
40.0000 mg | Freq: Two times a day (BID) | INTRAMUSCULAR | Status: DC
  Administered 2018-10-04: 40 mg via INTRAVENOUS
  Filled 2018-10-04: qty 4

## 2018-10-04 MED ORDER — FENTANYL 2500MCG IN NS 250ML (10MCG/ML) PREMIX INFUSION: 25.0000 ug/h | INTRAVENOUS | Status: DC

## 2018-10-04 MED ORDER — HYDROCORTISONE NA SUCCINATE PF 100 MG IJ SOLR
50.0000 mg | Freq: Four times a day (QID) | INTRAMUSCULAR | Status: DC
  Administered 2018-10-04 – 2018-10-05 (×4): 50 mg via INTRAVENOUS
  Filled 2018-10-04 (×4): qty 2

## 2018-10-04 MED ORDER — ONDANSETRON HCL 4 MG/2ML IJ SOLN
4.0000 mg | Freq: Four times a day (QID) | INTRAMUSCULAR | Status: DC | PRN
  Administered 2018-10-07: 10:00:00 4 mg via INTRAVENOUS
  Filled 2018-10-04: qty 2

## 2018-10-04 MED ORDER — PIPERACILLIN-TAZOBACTAM 3.375 G IVPB 30 MIN
3.3750 g | Freq: Once | INTRAVENOUS | Status: AC
  Administered 2018-10-04: 13:00:00 3.375 g via INTRAVENOUS
  Filled 2018-10-04: qty 50

## 2018-10-04 MED ORDER — SODIUM BICARBONATE 8.4 % IV SOLN
50.0000 meq | Freq: Once | INTRAVENOUS | Status: AC
  Administered 2018-10-04: 23:00:00 50 meq via INTRAVENOUS

## 2018-10-04 MED ORDER — SODIUM BICARBONATE 8.4 % IV SOLN
INTRAVENOUS | Status: AC
  Filled 2018-10-04: qty 50

## 2018-10-04 MED ORDER — HEPARIN SODIUM (PORCINE) 5000 UNIT/ML IJ SOLN
5000.0000 [IU] | Freq: Three times a day (TID) | INTRAMUSCULAR | Status: DC
  Administered 2018-10-04 – 2018-10-07 (×9): 5000 [IU] via SUBCUTANEOUS
  Filled 2018-10-04 (×9): qty 1

## 2018-10-04 MED ORDER — INSULIN ASPART 100 UNIT/ML ~~LOC~~ SOLN
0.0000 [IU] | SUBCUTANEOUS | Status: DC
  Administered 2018-10-04 – 2018-10-05 (×4): 1 [IU] via SUBCUTANEOUS

## 2018-10-04 MED ORDER — POLYVINYL ALCOHOL 1.4 % OP SOLN
1.0000 [drp] | Freq: Two times a day (BID) | OPHTHALMIC | Status: DC
  Administered 2018-10-04 – 2018-10-13 (×18): 1 [drp] via OPHTHALMIC
  Filled 2018-10-04: qty 15

## 2018-10-04 MED ORDER — FUROSEMIDE 10 MG/ML IJ SOLN
60.0000 mg | Freq: Once | INTRAMUSCULAR | Status: AC
  Administered 2018-10-05: 01:00:00 60 mg via INTRAVENOUS
  Filled 2018-10-04: qty 6

## 2018-10-04 MED ORDER — SODIUM CHLORIDE 0.9 % IV SOLN: 250.0000 mL | INTRAVENOUS | Status: DC | PRN

## 2018-10-04 MED ORDER — DEXMEDETOMIDINE HCL IN NACL 200 MCG/50ML IV SOLN
0.0000 ug/kg/h | INTRAVENOUS | Status: DC
  Filled 2018-10-04: qty 50

## 2018-10-04 MED ORDER — FUROSEMIDE 10 MG/ML IJ SOLN: 60.0000 mg | Freq: Once | INTRAMUSCULAR | Status: DC

## 2018-10-04 MED ORDER — SODIUM POLYSTYRENE SULFONATE 15 GM/60ML PO SUSP: 30.0000 g | Freq: Once | ORAL | Status: DC

## 2018-10-04 MED ORDER — FENTANYL BOLUS VIA INFUSION
25.0000 ug | INTRAVENOUS | Status: DC | PRN
  Filled 2018-10-04: qty 25

## 2018-10-04 MED ORDER — SODIUM CHLORIDE 0.9 % IV BOLUS
1000.0000 mL | Freq: Once | INTRAVENOUS | Status: AC
  Administered 2018-10-04: 1000 mL via INTRAVENOUS

## 2018-10-04 MED ORDER — PROPYLENE GLYCOL 0.6 % OP SOLN: 1.0000 [drp] | Freq: Two times a day (BID) | OPHTHALMIC | Status: DC

## 2018-10-04 MED ORDER — IPRATROPIUM-ALBUTEROL 0.5-2.5 (3) MG/3ML IN SOLN
3.0000 mL | RESPIRATORY_TRACT | Status: DC | PRN
  Administered 2018-10-11: 02:00:00 3 mL via RESPIRATORY_TRACT
  Filled 2018-10-04: qty 3

## 2018-10-04 MED ORDER — IPRATROPIUM-ALBUTEROL 0.5-2.5 (3) MG/3ML IN SOLN
3.0000 mL | Freq: Four times a day (QID) | RESPIRATORY_TRACT | Status: DC
  Administered 2018-10-04 – 2018-10-07 (×10): 3 mL via RESPIRATORY_TRACT
  Filled 2018-10-04 (×10): qty 3

## 2018-10-04 NOTE — Progress Notes (Signed)
Consult to ED for goal clarification.  See recent notes from Palliative team in history  Patient to ED from [facility] with decreased mental status. History of end stage pulmonary disease.  Patient unable to engage  In meaningful care discussion.   Leticia Penna called via phone.  She has been primary decision maker for patient during previous admissions as noted in chart history.  Discussed patient guarded condition and the need to clarify and determine the decision for intubation should she continue to deteriorate. Virgil Benedict was very tearful and upset but clearly repeated her knowledge of previous conversations with PMT.  Her verbal decision at this time is is to continue medical support, IV fluids, oxygen and antibiotics but NOT to initiate mechanical intubation.  Virgil Benedict is arranging to come to the hospital as soon as possible.  She has children at home schooling and also is working from home. verfied her decision verbally twice via phone.  Discussed with ED provider.  Will follow and meet with Virgil Benedict when she arrives.  Kizzie Fantasia, MSN, RN-BC, [facility], [facility] Palliative Clinical Specialist [facility]

## 2018-10-04 NOTE — Consult Note (Signed)
[facility] Renal Consultation Note  Requesting MD:  [name], MD Indication for Consultation:  AKI on CKD stage IV  Chief complaint: altered mental status  HPI:  [name] is a 78 y.o. female with a history of CKD stage IV (Baseline Cr 2-3 recently) and severe chronic diastolic CHF who presented from her SNF altered and with respiratory distress.  Palliative care has seen her.  She was DNR and this was rescinded today.  Pulmonology was consulted for possible intubation and is planning for BIPAP for now.  AKI on CKD with Cr 4.42 and BUN 140.  K 6.2.  She has been hospitalized recently and had been at a SNF.  She follows with Dr. Lorrene Reid at [facility] and last saw her in 03/2018.  The patient had indicated during appointments that she would not want dialysis should her CKD progress to that point.  She falls asleep during our interview.  Spoke with her niece at bedside Rene Paci at [phone]) who states that she cannot remember the last time her aunt walked on her own.  Note last admission she had Hb 6.9 at one point and is charted as not being a candidate for a colonoscopy.   Note covid negative today.  CXR today with mild bilateral perihilar and basilar opacities concerning for pulmonary edema or possibly multifocal PNA   TTE 09/11/2018 -hyperdynamic systolic function with an EF of greater than 65%.  Moderate concentric left ventricular hypertrophy.  Right ventricular systolic pressure moderately elevated.  Small pericardial effusion  Creatinine, Ser  Date/Time Value Ref Range Status  10/04/2018 11:15 AM 4.42 (H) 0.44 - 1.00 mg/dL Final  09/21/2018 03:46 AM 2.74 (H) 0.44 - 1.00 mg/dL Final  09/19/2018 03:20 AM 2.36 (H) 0.44 - 1.00 mg/dL Final  09/18/2018 04:41 AM 2.37 (H) 0.44 - 1.00 mg/dL Final  09/17/2018 04:37 AM 2.36 (H) 0.44 - 1.00 mg/dL Final  09/16/2018 05:00 AM 1.96 (H) 0.44 - 1.00 mg/dL Final  09/15/2018 04:48 AM 2.78 (H) 0.44 - 1.00 mg/dL Final   09/14/2018 04:18 AM 2.83 (H) 0.44 - 1.00 mg/dL Final  09/13/2018 02:20 AM 2.86 (H) 0.44 - 1.00 mg/dL Final  09/12/2018 02:05 AM 2.94 (H) 0.44 - 1.00 mg/dL Final  09/11/2018 02:30 AM 2.57 (H) 0.44 - 1.00 mg/dL Final  09/10/2018 05:15 AM 2.49 (H) 0.44 - 1.00 mg/dL Final  09/09/2018 05:11 AM 2.30 (H) 0.44 - 1.00 mg/dL Final  09/08/2018 04:19 AM 2.23 (H) 0.44 - 1.00 mg/dL Final  09/07/2018 03:38 AM 2.32 (H) 0.44 - 1.00 mg/dL Final  09/06/2018 03:59 AM 2.59 (H) 0.44 - 1.00 mg/dL Final  09/05/2018 04:48 AM 2.41 (H) 0.44 - 1.00 mg/dL Final  09/04/2018 04:49 AM 2.33 (H) 0.44 - 1.00 mg/dL Final  09/03/2018 03:45 AM 2.52 (H) 0.44 - 1.00 mg/dL Final  09/01/2018 03:38 PM 2.74 (H) 0.44 - 1.00 mg/dL Final  08/02/2018 07:00 AM 2.55 (H) 0.44 - 1.00 mg/dL Final  08/01/2018 03:04 AM 2.59 (H) 0.44 - 1.00 mg/dL Final  07/31/2018 03:00 AM 2.28 (H) 0.44 - 1.00 mg/dL Final  07/30/2018 02:15 AM 2.36 (H) 0.44 - 1.00 mg/dL Final  07/29/2018 04:08 AM 2.52 (H) 0.44 - 1.00 mg/dL Final  07/28/2018 02:49 AM 2.46 (H) 0.44 - 1.00 mg/dL Final  07/27/2018 02:56 AM 2.46 (H) 0.44 - 1.00 mg/dL Final  07/26/2018 03:20 AM 3.08 (H) 0.44 - 1.00 mg/dL Final  07/25/2018 03:35 AM 3.37 (H) 0.44 - 1.00 mg/dL Final  07/24/2018 06:02 AM 2.91 (H) 0.44 -  1.00 mg/dL Final  07/23/2018 03:40 PM 3.20 (H) 0.44 - 1.00 mg/dL Final  07/23/2018 03:21 PM 3.18 (H) 0.44 - 1.00 mg/dL Final  07/09/2018 03:18 AM 2.47 (H) 0.44 - 1.00 mg/dL Final  07/08/2018 08:24 AM 2.34 (H) 0.44 - 1.00 mg/dL Final  07/07/2018 02:24 AM 2.48 (H) 0.44 - 1.00 mg/dL Final  07/06/2018 06:58 AM 2.91 (H) 0.44 - 1.00 mg/dL Final  07/05/2018 11:22 AM 3.30 (H) 0.44 - 1.00 mg/dL Final  06/20/2018 07:07 AM 2.35 (H) 0.44 - 1.00 mg/dL Final  06/19/2018 04:30 AM 2.44 (H) 0.44 - 1.00 mg/dL Final  06/18/2018 09:24 AM 2.42 (H) 0.44 - 1.00 mg/dL Final  06/17/2018 04:01 PM 2.37 (H) 0.44 - 1.00 mg/dL Final  06/17/2018 07:40 AM 2.32 (H) 0.44 - 1.00 mg/dL Final  06/17/2018 03:26 AM  2.47 (H) 0.44 - 1.00 mg/dL Final  06/16/2018 08:17 PM 2.65 (H) 0.44 - 1.00 mg/dL Final  04/07/2017 03:41 AM 2.72 (H) 0.44 - 1.00 mg/dL Final  04/06/2017 02:45 AM 2.76 (H) 0.44 - 1.00 mg/dL Final  04/05/2017 02:26 AM 2.53 (H) 0.44 - 1.00 mg/dL Final  04/04/2017 02:04 AM 2.14 (H) 0.44 - 1.00 mg/dL Final  04/03/2017 03:42 PM 2.09 (H) 0.44 - 1.00 mg/dL Final  12/02/2016 04:45 AM 2.96 (H) 0.44 - 1.00 mg/dL Final  12/02/2016 12:01 AM 3.11 (H) 0.44 - 1.00 mg/dL Final  12/01/2016 01:56 AM 2.75 (H) 0.44 - 1.00 mg/dL Final    PMHx:   Past Medical History:  Diagnosis Date  . Adenomatous colon polyp   . Anemia   . Arthritis   . Asthma   . COPD (chronic obstructive pulmonary disease) ([place])   . 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 Depression   . Diabetes mellitus   . Diverticulosis   . Gastric ulcer   . GERD (gastroesophageal reflux disease)   . HTN (hypertension)   . Hypercholesterolemia   . Internal hemorrhoids   . Lumbar disc disease   . On home oxygen therapy    "2-3L @ bedtime" (04/04/2017)  . OSA (obstructive sleep apnea) 09/17/2012  . Peptic ulcer disease   . 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 Umbilical hernia   . Valgus foot    right     Past Surgical History:  Procedure Laterality Date  . COLONOSCOPY  2209 or 2010    2001, 2005, 2009  colonoscopy.  [place]  . COLONOSCOPY N/A 08/14/2012   Procedure: COLONOSCOPY;  Surgeon: Ladene Artist, MD;  Location: [facility] ENDOSCOPY;  Service: Endoscopy;  Laterality: N/A;  . ESOPHAGOGASTRODUODENOSCOPY (EGD) WITH PROPOFOL N/A 07/25/2018   Procedure: ESOPHAGOGASTRODUODENOSCOPY (EGD) WITH PROPOFOL;  Surgeon: Gatha Mayer, MD;  Location: [facility];  Service: Endoscopy;  Laterality: N/A;  . INCISE AND DRAIN ABCESS     Abdominal Wall  . LIPOMA RESECTION     Right Neck  . TOTAL KNEE ARTHROPLASTY     Bilateral  . UMBILICAL HERNIA REPAIR  2007   incarcerated  . UPPER GASTROINTESTINAL ENDOSCOPY  2001   medoff Grade A  esophagitis    Family Hx:  Family History  Problem Relation Age of Onset  . Breast cancer Maternal Aunt     Social History:  reports that she quit smoking about 60 years ago. Her smoking use included cigarettes. She has a 10.00 pack-year smoking history. She has never used smokeless tobacco. She reports that she does not drink alcohol or use drugs.  Allergies:  Allergies  Allergen Reactions  .  Other Other (See Comments)    "all generics make her sick"    Medications: Prior to Admission medications   Medication Sig Start Date End Date Taking? Authorizing Provider  acetaminophen (TYLENOL) 325 MG tablet Take 650 mg by mouth every 6 (six) hours as needed for mild pain.   Yes [provider]  amLODipine (NORVASC) 10 MG tablet Take 10 mg by mouth daily.    Yes [provider]  bisacodyl (DULCOLAX) 10 MG suppository Place 10 mg rectally daily as needed for moderate constipation.   Yes [provider]  calcitRIOL (ROCALTROL) 0.25 MCG capsule Take 1 capsule (0.25 mcg total) by mouth every Monday, Wednesday, and Friday. Patient taking differently: Take 0.25 mcg by mouth daily.  12/05/16  Yes Velvet Bathe, MD  cyclobenzaprine (FLEXERIL) 10 MG tablet Take 10 mg 3 (three) times daily as needed by mouth for muscle spasms.   Yes [provider]  docusate sodium (COLACE) 100 MG capsule Take 1 capsule (100 mg total) by mouth daily. 09/22/18 10/22/18 Yes Donne Hazel, MD  feeding supplement, GLUCERNA SHAKE, (GLUCERNA SHAKE) LIQD Take 237 mLs by mouth 2 (two) times daily between meals. 08/02/18  Yes Mikhail, Velta Addison, DO  folic acid (FOLVITE) 166 MCG tablet Take 400 mcg by mouth daily.   Yes [provider]  hydrALAZINE (APRESOLINE) 50 MG tablet Take 1 tablet (50 mg total) by mouth every 6 (six) hours. 09/21/18 10/21/18 Yes Donne Hazel, MD  Insulin Aspart (NOVOLOG FLEXPEN [place]) Inject 0-10 Units into the skin See admin instructions. Per Sliding Scale ar  0600, 1100, 1600, 2100 100-150 0units 151-200 2units 201-250 4units 251-300 6units 301-350 8units 351-400 10units   Yes [provider]  insulin aspart (NOVOLOG) 100 UNIT/ML injection Inject 6 Units into the skin 3 (three) times daily with meals. 06/20/18  Yes [place] Eric J, DO  insulin glargine (LANTUS) 100 UNIT/ML injection Inject 0.15 mLs (15 Units total) into the skin at bedtime. 08/02/18  Yes Mikhail, Maryann, DO  ipratropium-albuterol (DUONEB) 0.5-2.5 (3) MG/3ML SOLN Take 3 mLs by nebulization every 6 (six) hours as needed (SOB, wheezing).   Yes [provider]  iron polysaccharides (NIFEREX) 150 MG capsule Take 150 mg by mouth 2 (two) times daily.   Yes [provider]  isosorbide mononitrate (IMDUR) 30 MG 24 hr tablet Take 1 tablet (30 mg total) by mouth daily. 09/22/18  Yes Donne Hazel, MD  magnesium oxide (MAG-OX) 400 MG tablet Take 400 mg by mouth daily.   Yes [provider]  montelukast (SINGULAIR) 10 MG tablet Take 1 tablet (10 mg total) by mouth at bedtime. 10/05/16  Yes Chesley Mires, MD  Multiple Vitamins-Minerals (MULTIVITAMIN ADULT PO) Take 1 tablet by mouth daily.   Yes [provider]  nitroGLYCERIN (NITROSTAT) 0.4 MG SL tablet Place 0.4 mg under the tongue every 5 (five) minutes as needed for chest pain.   Yes [provider]  Omega-3 Fatty Acids (FISH OIL) 1000 MG CAPS Take 1,000 mg by mouth daily.    Yes [provider]  ondansetron (ZOFRAN) 4 MG tablet Take 4 mg by mouth every 6 (six) hours as needed for nausea or vomiting.   Yes [provider]  oxyCODONE-acetaminophen (PERCOCET/ROXICET) 5-325 MG tablet Take 1 tablet by mouth every 6 (six) hours as needed for severe pain. Prescription needed for SNF placement 09/21/18  Yes Donne Hazel, MD  OXYGEN Inhale 3 L into the lungs See admin instructions. 3L every minute per nasal  cannula   Yes [provider]  pantoprazole (PROTONIX) 40 MG tablet  Take 1 tablet (40 mg total) by mouth daily at 6 (six) AM. 08/03/18  Yes Mikhail, Velta Addison, DO  polyethylene glycol (MIRALAX / GLYCOLAX) 17 g packet Take 17 g by mouth daily.   Yes [provider]  Propylene Glycol (SYSTANE BALANCE) 0.6 % SOLN Apply 1 drop to eye 2 (two) times daily.    Yes [provider]  pyridOXINE (VITAMIN B-6) 100 MG tablet Take 100 mg by mouth daily.   Yes [provider]  senna (SENOKOT) 8.6 MG TABS tablet Take 1 tablet (8.6 mg total) by mouth 2 (two) times daily. 09/21/18 10/21/18 Yes Donne Hazel, MD  traZODone (DESYREL) 50 MG tablet Take 50 mg by mouth at bedtime.    Yes [provider]  vitamin B-12 (CYANOCOBALAMIN) 1000 MCG tablet Take 1,000 mcg by mouth daily.   Yes [provider]  Vitamin D, Ergocalciferol, (DRISDOL) 50000 UNITS CAPS capsule Take 50,000 Units by mouth 2 (two) times a week. MON and THUR 04/07/14  Yes [provider]  vitamin E 400 UNIT capsule Take 400 Units by mouth daily.   Yes [provider]  ZOCOR 20 MG tablet Take 20 mg by mouth daily. 03/16/14  Yes [provider]  Amino Acids-Protein Hydrolys (FEEDING SUPPLEMENT, PRO-STAT SUGAR FREE 64,) LIQD Take 30 mLs by mouth 2 (two) times daily. 06/20/18   [place] Donnamarie Poag, DO  multivitamin (PROSIGHT) TABS tablet Take 1 tablet by mouth daily. Patient not taking: Reported on 09/03/2018 08/02/18   Cristal Ford, DO  torsemide (DEMADEX) 10 MG tablet Take 2 tablets (20 mg total) by mouth 2 (two) times daily. Patient taking differently: Take 30 mg by mouth every 12 (twelve) hours as needed (fluid retention).  07/09/18   Domenic Polite, MD    I have reviewed the patient's current medications and her reported prior to admission medications.  Labs:  BMP Latest Ref Rng & Units 10/04/2018 10/04/2018 09/21/2018  Glucose 70 - 99 mg/dL - 179(H) 199(H)  BUN 8 - 23 mg/dL - 140(H) 117(H)  Creatinine 0.44 - 1.00 mg/dL - 4.42(H) 2.74(H)  Sodium 135 - 145  mmol/L 143 143 139  Potassium 3.5 - 5.1 mmol/L 6.2(H) 6.5(HH) 4.3  Chloride 98 - 111 mmol/L - 107 94(L)  CO2 22 - 32 mmol/L - 23 30  Calcium 8.9 - 10.3 mg/dL - 8.9 8.9    Urinalysis    Component Value Date/Time   COLORURINE STRAW (A) 07/23/2018 1656   APPEARANCEUR CLEAR 07/23/2018 1656   LABSPEC 1.011 07/23/2018 1656   PHURINE 5.0 07/23/2018 1656   GLUCOSEU NEGATIVE 07/23/2018 1656   HGBUR NEGATIVE 07/23/2018 1656   BILIRUBINUR NEGATIVE 07/23/2018 1656   KETONESUR NEGATIVE 07/23/2018 1656   PROTEINUR NEGATIVE 07/23/2018 1656   UROBILINOGEN 0.2 12/16/2011 1717   NITRITE NEGATIVE 07/23/2018 1656   LEUKOCYTESUR NEGATIVE 07/23/2018 1656     ROS: Limited by altered mental status   Physical Exam: Vitals:   10/04/18 1515 10/04/18 1530  BP: (!) 100/53 (!) 91/47  Pulse: 62 63  Resp: (!) 29 (!) 26  Temp:    SpO2: (!) 87% (!) 88%     General: elderly female in stretcher on NRB  HEENT: NCAT  Eyes:EOMI sclera anicteric Neck: trachea midline; supple Heart: S1S2 no rub Lungs: occ wheezing, on nonrebreather  Abdomen: soft/NT/ND; normal bowel sounds Extremities: 1+ edema upper extremities and trace edema lower extremities Skin: no rash on extremities  exposed Neuro: falls asleep during interview; oriented to person and location but does not know why she came to the hospital GU purewick; no foley   Assessment/Plan:  # AKI  - Secondary to ischemic and pre-renal insults and possible component of CKD progression - Repeat BMP  - Bladder scan and place foley if evidence of retention - Now with hypotension  - Holding anti-hypertensives to avoid ischemic insults  # CKD stage IV  - Has previously stated that she would not want hemodialysis if her CKD progressed to that point and I am concerned regarding her candidacy for the procedure as well  # Acute hypoxic and hypercapneic respiratory failure  - Pulmonology consulted   - They are planning for BIPAP for now  - Previously DNR,  this was rescinded earlier today  - Discussed with pulmonology.  Intubation was held in conjunction with decision re: dialysis.  Pulm spoke with family during my exam and they recommended transition to comfort measures should non-invasive measures fail to help her  - Abx per primary team  - Repeat Lasix 60 mg IV this evening   # Chronic diastolic CHF, moderate AS - s/p IV fluids in the IR, now off; received IV lasix once at 1600  - Repeat lasix x 1 this evening and reassess  # Altered mental status - Improving and multifactorial with hypercapneic resp failure and likely uremia   # Hyperkalemia  - Kayexalate once now  - lasix and saline given earlier today - Repeat BMP     Claudia Desanctis 10/04/2018, 5:19 PM

## 2018-10-04 NOTE — Progress Notes (Signed)
RT NOTE: RT spoke with RN about bipap order. COVID test results has not returned and per RN patient is not in a negative pressure room to start bipap. RN told RT she will call RT when COVID test has returned. RT will continue to monitor as needed.

## 2018-10-04 NOTE — ED Provider Notes (Addendum)
[facility] EMERGENCY DEPARTMENT Provider Note   CSN: [id number] Arrival date & time: 10/04/18  1101     History   Chief Complaint Chief Complaint  Patient presents with  . Shortness of Breath    HPI [name] is a 78 y.o. female.    Level 5 caveat HPI  78 yo female ho copd chf dm hypertension with dnr presents from NH with report of AMS and hypoxia.  Report from EMS that patient was found by nursing home staff with decreased LOC at 10 AM.  No last known normal available.  Patient is very sleepy and unable to give full history. Chart reviewed with last discharge 911 when she was in the hospital with CHF and had hypoxic respiratory failure.  During that hospitalization she was intubated.  On discharge she was on oxygen via nasal cannula.  She was on torsemide per cardiology.  Record reviews that she was at palliative care consult.  This consult records a limited resuscitation indicates that a [place] should be initiated for a respiratory arrest and that the patient would want non-] base of pressure ventilation- wishes intubation.  No compressions Past Medical History:  Diagnosis Date  . Adenomatous colon polyp   . Anemia   . Arthritis   . Asthma   . COPD (chronic obstructive pulmonary disease) ([place])   . COPD with asthma ([place]) 04/20/2011  . Depression   . Diabetes mellitus   . Diverticulosis   . Gastric ulcer   . GERD (gastroesophageal reflux disease)   . HTN (hypertension)   . Hypercholesterolemia   . Internal hemorrhoids   . Lumbar disc disease   . On home oxygen therapy    "2-3L @ bedtime" (04/04/2017)  . OSA (obstructive sleep apnea) 09/17/2012  . Peptic ulcer disease   . 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 Umbilical hernia   . Valgus foot    right     Patient Active Problem List   Diagnosis Date Noted  . Acute respiratory failure with hypoxemia ([place])   . Acute encephalopathy   . Acute respiratory failure with hypoxia and  hypercapnia ([place]) 09/11/2018  . Shock circulatory ([place])   . Elevated brain natriuretic peptide (BNP) level 09/03/2018  . Troponin I above reference range 09/03/2018  . Acute on chronic combined systolic (congestive) and diastolic (congestive) heart failure ([place]) 09/03/2018  . Hyperkalemia 09/03/2018  . Acute renal failure superimposed on chronic kidney disease ([place])   . Pressure injury of skin 07/24/2018  . Aortic atherosclerosis ([place]) 07/24/2018  . Occult blood positive stool   . Anemia due to GI blood loss 07/23/2018  . Essential hypertension 07/05/2018  . CHF (congestive heart failure), NYHA class IV, chronic, combined ([place]) 07/05/2018  . Diabetic hyperosmolar non-ketotic state ([place]) 06/17/2018  . Hyperglycemia 06/16/2018  . Type 2 diabetes mellitus ([place]) 06/16/2018  . UTI (urinary tract infection) 06/16/2018  . Sacral decubitus ulcer 06/16/2018  . Hypertensive urgency 04/04/2017  . CKD (chronic kidney disease) stage 4, GFR 15-29 ml/min (H[facility]) 04/04/2017  . Hypoglycemia   . Metabolic acidosis with normal anion gap and bicarbonate losses 11/23/2016  . Acute on chronic heart failure with preserved ejection fraction ([place]) 11/22/2016  . Midfoot ulcer, left, limited to breakdown of skin ([place]) 06/13/2016  . Idiopathic chronic venous hypertension of both lower extremities with inflammation 03/10/2016  . Onychomycosis 03/10/2016  . Chronic obstructive pulmonary disease ([place]) 11/07/2013  . Upper airway cough syndrome 11/07/2013  .  Insomnia 12/10/2012  . OSA (obstructive sleep apnea) 09/17/2012  . Benign neoplasm of colon 08/14/2012  . Rectal bleeding 08/13/2012    Class: Acute  . Anemia of chronic disease 08/13/2012    Class: Acute  . Osteoarthritis 08/13/2012    Class: Chronic  . Acute posthemorrhagic anemia 08/13/2012  . Diverticulosis of colon (without mention of hemorrhage) 08/13/2012  . Hoarseness 07/20/2011  . COPD with asthma ([place]) 04/20/2011  . Allergic rhinitis 04/20/2011   . GI bleed 02/08/2011  . Personal history of colonic polyps 04/16/2010  . Fecal incontinence 03/23/2010  . Change in bowel habits 03/23/2010    Past Surgical History:  Procedure Laterality Date  . COLONOSCOPY  2209 or 2010    2001, 2005, 2009  colonoscopy.  [place]  . COLONOSCOPY N/A 08/14/2012   Procedure: COLONOSCOPY;  Surgeon: Ladene Artist, MD;  Location: [facility] ENDOSCOPY;  Service: Endoscopy;  Laterality: N/A;  . ESOPHAGOGASTRODUODENOSCOPY (EGD) WITH PROPOFOL N/A 07/25/2018   Procedure: ESOPHAGOGASTRODUODENOSCOPY (EGD) WITH PROPOFOL;  Surgeon: Gatha Mayer, MD;  Location: [facility];  Service: Endoscopy;  Laterality: N/A;  . INCISE AND DRAIN ABCESS     Abdominal Wall  . LIPOMA RESECTION     Right Neck  . TOTAL KNEE ARTHROPLASTY     Bilateral  . UMBILICAL HERNIA REPAIR  2007   incarcerated  . UPPER GASTROINTESTINAL ENDOSCOPY  2001   medoff Grade A esophagitis     OB History   No obstetric history on file.      Home Medications    Prior to Admission medications   Medication Sig Start Date End Date Taking? Authorizing Provider  acetaminophen (TYLENOL) 325 MG tablet Take 650 mg by mouth every 6 (six) hours as needed for mild pain.    [provider]  Amino Acids-Protein Hydrolys (FEEDING SUPPLEMENT, PRO-STAT SUGAR FREE 64,) LIQD Take 30 mLs by mouth 2 (two) times daily. 06/20/18   [place] Eric J, DO  amLODipine (NORVASC) 10 MG tablet Take 10 mg by mouth daily.     [provider]  calcitRIOL (ROCALTROL) 0.25 MCG capsule Take 1 capsule (0.25 mcg total) by mouth every Monday, Wednesday, and Friday. Patient taking differently: Take 0.25 mcg by mouth daily.  12/05/16   Velvet Bathe, MD  cyclobenzaprine (FLEXERIL) 10 MG tablet Take 10 mg 3 (three) times daily as needed by mouth for muscle spasms.    [provider]  docusate sodium (COLACE) 100 MG capsule Take 1 capsule (100 mg total) by mouth daily. 09/22/18 10/22/18  Donne Hazel, MD  feeding  supplement, GLUCERNA SHAKE, (GLUCERNA SHAKE) LIQD Take 237 mLs by mouth 2 (two) times daily between meals. 08/02/18   Mikhail, Velta Addison, DO  folic acid (FOLVITE) 132 MCG tablet Take 400 mcg by mouth daily.    [provider]  hydrALAZINE (APRESOLINE) 50 MG tablet Take 1 tablet (50 mg total) by mouth every 6 (six) hours. 09/21/18 10/21/18  Donne Hazel, MD  Insulin Aspart (NOVOLOG FLEXPEN [place]) Inject 0-10 Units into the skin See admin instructions. Per Sliding Scale ar 0600, 1100, 1600, 2100 100-150 0units 151-200 2units 201-250 4units 251-300 6units 301-350 8units 351-400 10units    [provider]  insulin aspart (NOVOLOG) 100 UNIT/ML injection Inject 6 Units into the skin 3 (three) times daily with meals. 06/20/18   [place] Eric J, DO  insulin glargine (LANTUS) 100 UNIT/ML injection Inject 0.15 mLs (15 Units total) into the skin at bedtime. 08/02/18   Cristal Ford, DO  ipratropium-albuterol (  DUONEB) 0.5-2.5 (3) MG/3ML SOLN Take 3 mLs by nebulization every 6 (six) hours as needed (SOB, wheezing).    [provider]  iron polysaccharides (NIFEREX) 150 MG capsule Take 150 mg by mouth 2 (two) times daily.    [provider]  isosorbide mononitrate (IMDUR) 30 MG 24 hr tablet Take 1 tablet (30 mg total) by mouth daily. 09/22/18   Donne Hazel, MD  magnesium oxide (MAG-OX) 400 MG tablet Take 400 mg by mouth daily.    [provider]  montelukast (SINGULAIR) 10 MG tablet Take 1 tablet (10 mg total) by mouth at bedtime. 10/05/16   Chesley Mires, MD  Multiple Vitamins-Minerals (MULTIVITAMIN ADULT PO) Take 1 tablet by mouth daily.    [provider]  multivitamin (PROSIGHT) TABS tablet Take 1 tablet by mouth daily. Patient not taking: Reported on 09/03/2018 08/02/18   Cristal Ford, DO  nitroGLYCERIN (NITROSTAT) 0.4 MG SL tablet Place 0.4 mg under the tongue every 5 (five) minutes as needed for chest pain.    [provider]  Omega-3 Fatty  Acids (FISH OIL) 1000 MG CAPS Take 1,000 mg by mouth daily.     [provider]  oxyCODONE-acetaminophen (PERCOCET/ROXICET) 5-325 MG tablet Take 1 tablet by mouth every 6 (six) hours as needed for severe pain. Prescription needed for SNF placement 09/21/18   Donne Hazel, MD  pantoprazole (PROTONIX) 40 MG tablet Take 1 tablet (40 mg total) by mouth daily at 6 (six) AM. 08/03/18   Mikhail, Velta Addison, DO  Propylene Glycol (SYSTANE BALANCE) 0.6 % SOLN Apply 1 drop to eye 2 (two) times daily.     [provider]  pyridOXINE (VITAMIN B-6) 100 MG tablet Take 100 mg by mouth daily.    [provider]  senna (SENOKOT) 8.6 MG TABS tablet Take 1 tablet (8.6 mg total) by mouth 2 (two) times daily. 09/21/18 10/21/18  Donne Hazel, MD  torsemide (DEMADEX) 10 MG tablet Take 2 tablets (20 mg total) by mouth 2 (two) times daily. 07/09/18   Domenic Polite, MD  traZODone (DESYREL) 50 MG tablet Take 50 mg by mouth at bedtime.     [provider]  vitamin B-12 (CYANOCOBALAMIN) 1000 MCG tablet Take 1,000 mcg by mouth daily.    [provider]  Vitamin D, Ergocalciferol, (DRISDOL) 50000 UNITS CAPS capsule Take 50,000 Units by mouth 2 (two) times a week. MON and THUR 04/07/14   [provider]  vitamin E 400 UNIT capsule Take 400 Units by mouth daily.    [provider]  ZOCOR 20 MG tablet Take 20 mg by mouth daily. 03/16/14   [provider]    Family History Family History  Problem Relation Age of Onset  . Breast cancer Maternal Aunt     Social History Social History   Tobacco Use  . Smoking status: Former Smoker    Packs/day: 2.00    Years: 5.00    Pack years: 10.00    Types: Cigarettes    Quit date: 01/10/1958    Years since quitting: 60.7  . Smokeless tobacco: Never Used  Substance Use Topics  . Alcohol use: No    Comment: Quit when she quit smoking  . Drug use: No     Allergies   Other   Review of Systems Review of Systems   Unable to perform ROS: Mental status change     Physical Exam Updated Vital Signs BP 103/63 (BP Location: Left Arm)   Pulse 73  Temp (!) 97.5 F (36.4 C) (Axillary)   Resp 20   Ht 1.626 m (5\' 4" )   Wt 90.7 kg   SpO2 (!) 87%   BMI 34.33 kg/m   Physical Exam Vitals signs and nursing note reviewed.  Constitutional:      General: She is not in acute distress.    Appearance: She is obese. She is ill-appearing.  HENT:     Head: Normocephalic.  Eyes:     Pupils: Pupils are equal, round, and reactive to light.  Neck:     Musculoskeletal: Normal range of motion.  Cardiovascular:     Rate and Rhythm: Normal rate and regular rhythm.  Pulmonary:     Effort: Tachypnea present.     Comments: Crackles noted bilateral bases Abdominal:     General: Bowel sounds are normal.     Palpations: Abdomen is soft.  Musculoskeletal:     Comments: Bilateral lower extremities appear to have decreased muscle tone Dressings are in place bilateral feet   Skin:    Capillary Refill: Capillary refill takes less than 2 seconds.     Comments: Skin breakdown noted bilateral buttocks and wound on right lower abdominal wall with dressing in place appears to be lesion or healing wound  Neurological:     General: No focal deficit present.     Comments: Patient appears to be very sleepy when she is able to tell me that she is in the hospital and that the year is 2020 has not appreciated any lateralized deficits She appears to have decreased tone throughout      ED Treatments / Results  Labs (all labs ordered are listed, but only abnormal results are displayed) Labs Reviewed  CBC - Abnormal; Notable for the following components:      Result Value   RBC 3.44 (*)    Hemoglobin 10.1 (*)    MCV 105.8 (*)    MCHC 27.7 (*)    RDW 18.5 (*)    All other components within normal limits  POCT I-STAT 7, (LYTES, BLD GAS, ICA,H+H) - Abnormal; Notable for the following components:   pH, Arterial 7.190 (*)     pCO2 arterial 76.0 (*)    pO2, Arterial 69.0 (*)    Bicarbonate 29.3 (*)    Potassium 6.2 (*)    HCT 30.0 (*)    Hemoglobin 10.2 (*)    All other components within normal limits  SARS CORONAVIRUS 2 (HOSPITAL ORDER, [facility])  CULTURE, BLOOD (ROUTINE X 2)  CULTURE, BLOOD (ROUTINE X 2)  COMPREHENSIVE METABOLIC PANEL  BRAIN NATRIURETIC PEPTIDE  LACTIC ACID, PLASMA  OCCULT BLOOD X 1 CARD TO LAB, STOOL  I-STAT ARTERIAL BLOOD GAS, ED    EKG EKG Interpretation  Date/Time:  Thursday October 04 2018 11:08:43 EDT Ventricular Rate:  73 PR Interval:    QRS Duration: 93 QT Interval:  390 QTC Calculation: 430 R Axis:   114 Text Interpretation:  Sinus rhythm Right axis deviation Borderline repolarization abnormality Confirmed by Pattricia Boss [phone]) on 10/04/2018 11:13:38 AM   Radiology Dg Chest Port 1 View  Result Date: 10/04/2018 CLINICAL DATA:  Dyspnea. EXAM: PORTABLE CHEST 1 VIEW COMPARISON:  Radiographs of September 19, 2018. FINDINGS: Stable cardiomegaly. Atherosclerosis of thoracic aorta is noted. No pneumothorax is noted. Mild bilateral perihilar and basilar opacities are noted concerning for possible edema or possibly multifocal pneumonia. Minimal bilateral pleural effusions are noted. Degenerative changes are noted in the left glenohumeral joint. IMPRESSION:  Stable cardiomegaly is noted. Mild bilateral perihilar and basilar opacities are noted concerning for possible edema or possibly multifocal pneumonia. Aortic Atherosclerosis (ICD10-I70.0). Electronically Signed   By: Marijo Conception M.D.   On: 10/04/2018 12:15    Procedures .Critical Care Performed by: Pattricia Boss, MD Authorized by: Pattricia Boss, MD   Critical care provider statement:    Critical care time (minutes):  45   Critical care end time:  10/04/2018 1:55 PM   Critical care was necessary to treat or prevent imminent or life-threatening deterioration of the following conditions:   Cardiac failure, circulatory failure, respiratory failure and metabolic crisis   Critical care was time spent personally by me on the following activities:  Discussions with consultants, evaluation of patient's response to treatment, examination of patient, ordering and performing treatments and interventions, ordering and review of laboratory studies, ordering and review of radiographic studies, pulse oximetry, re-evaluation of patient's condition, obtaining history from patient or surrogate and review of old charts   (including critical care time)  Medications Ordered in ED Medications - No data to display   Initial Impression / Assessment and Plan / ED Course  I have reviewed the triage vital signs and the nursing notes.  Pertinent labs & imaging results that were available during my care of the patient were reviewed by me and considered in my medical decision making (see chart for details).    Discussed with Threasa Beards, NP on call for palliative care and they will come and evaluate   Patient with hypoxic hypercarbic respiratory failure here in ED.  Chest x-Payne Garske with infiltrate versus edema.  Plan covered with broad-spectrum antibiotics.  Patient has been borderline hypotensive.  She is receiving 1 L normal saline. Palliative care is attempting to contact family for clarification of CODE STATUS. BiPAP ordered but patient has come from nursing home and will need COVID testing. Palliative care consulted and they discussed with niece Galen Manila and does not wish intubation.  Given  Decreasing ms patient does not appear to be candidate for bipap.   Discussed with Dr. Tamala Julian and he will see patient for admission.  We discussed BiPAP.  Although patient does have brief decreased mental status, may consider BiPAP after COVID testing results. 4:36 PM Discussed with Dr. Tamala Julian.  Niece has arrived in ED.  Patient is more alert.  Niece discussed with patient and patient wishes intubation. Patient is much  more awake and alert and able to converse. Dr. Tamala Julian has consulted critical care Critical care is at bedside and care was discussed with them. BiPAP is initiated as COVID is negative and will repeat ABG Final Clinical Impressions(s) / ED Diagnoses   Final diagnoses:  Acute on chronic respiratory failure with hypoxia and hypercapnia [facility])    ED Discharge Orders    None       Pattricia Boss, MD 10/04/18 1356    Pattricia Boss, MD 10/04/18 [phone]

## 2018-10-04 NOTE — Progress Notes (Signed)
After discussions with critical care and nephrology.  Patient deemed not a candidate for hemodialysis and therefore not a candidate for intubation.  Admitting to stepdown with a trial of BiPAP and diuretics see improvement of symptoms.  If no improvement symptoms may warrant transition to comfort care

## 2018-10-04 NOTE — H&P (Addendum)
History and Physical    [name] MHD:[id number] DOB: 08-Jan-1941 DOA: 10/04/2018  Referring MD/NP/PA: Pattricia Boss, MD PCP: Garwin Brothers, MD  Patient coming from: [facility] via EMS  Chief Complaint: Shortness of breath  I have personally briefly reviewed patient's old medical records in [place]   HPI: [name] is a 78 y.o. female with medical history significant of  CHF, COPD, diabetes mellitus type 2, and anemia; who presents with complaints of shortness of breath.  She was found by nursing home staff this morning to have decreased level of consciousness at around 10 AM.  Last hospitalized from 8/24-9/11 for combined CHF exacerbation requiring intubation during last hospitalization.  After discharge the patient was made DNR while at the nursing facility.  Patient is alert and denies any complaints of chest pain at this time.  History was limited due to critical illness.  ED Course: Upon admission into the emergency department patient was seen to be afebrile, pulse 66-73, respirations 13-39, blood pressure 85/57 - 104/58, O2 saturations in the mid 80s despite being placed on a nonrebreather.  ABG revealed pH of 7.19, PCO2 76, PO2 69.  Labs significant for WBC 7.8, globin 10.1, potassium 6.5, BUN 140, creatinine 4.42, AST 187, ALT 140, total bilirubin 0.5, and BNP 332.6. Chest x-ray showing stable cardiomegaly with bilateral perihilar and basilar opacities concerning for possible edema or multifocal pneumonia. COVID-19 screening negative.  Palliative care was consulted and verified that patient is not to be intubated with her niece over the phone.  However, after talks with the patient with niece at bedside who appears to be in her right state of mind stating okay for intubation.  Patient was given Zosyn IV.  TRH called to admit.  Review of Systems  Unable to perform ROS: Critical illness  Respiratory: Positive for shortness of breath.   Musculoskeletal: Positive for  myalgias.    Past Medical History:  Diagnosis Date  . Adenomatous colon polyp   . Anemia   . Arthritis   . Asthma   . COPD (chronic obstructive pulmonary disease) ([place])   . COPD with asthma ([place]) 04/20/2011  . Depression   . Diabetes mellitus   . Diverticulosis   . Gastric ulcer   . GERD (gastroesophageal reflux disease)   . HTN (hypertension)   . Hypercholesterolemia   . Internal hemorrhoids   . Lumbar disc disease   . On home oxygen therapy    "2-3L @ bedtime" (04/04/2017)  . OSA (obstructive sleep apnea) 09/17/2012  . Peptic ulcer disease   . 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 Umbilical hernia   . Valgus foot    right     Past Surgical History:  Procedure Laterality Date  . COLONOSCOPY  2209 or 2010    2001, 2005, 2009  colonoscopy.  [place]  . COLONOSCOPY N/A 08/14/2012   Procedure: COLONOSCOPY;  Surgeon: Ladene Artist, MD;  Location: [facility] ENDOSCOPY;  Service: Endoscopy;  Laterality: N/A;  . ESOPHAGOGASTRODUODENOSCOPY (EGD) WITH PROPOFOL N/A 07/25/2018   Procedure: ESOPHAGOGASTRODUODENOSCOPY (EGD) WITH PROPOFOL;  Surgeon: Gatha Mayer, MD;  Location: [facility];  Service: Endoscopy;  Laterality: N/A;  . INCISE AND DRAIN ABCESS     Abdominal Wall  . LIPOMA RESECTION     Right Neck  . TOTAL KNEE ARTHROPLASTY     Bilateral  . UMBILICAL HERNIA REPAIR  2007   incarcerated  . UPPER GASTROINTESTINAL ENDOSCOPY  2001   medoff Grade  A esophagitis     reports that she quit smoking about 60 years ago. Her smoking use included cigarettes. She has a 10.00 pack-year smoking history. She has never used smokeless tobacco. She reports that she does not drink alcohol or use drugs.  Allergies  Allergen Reactions  . Other Other (See Comments)    "all generics make her sick"    Family History  Problem Relation Age of Onset  . Breast cancer Maternal Aunt     Prior to Admission medications   Medication Sig Start Date End Date Taking? Authorizing Provider   acetaminophen (TYLENOL) 325 MG tablet Take 650 mg by mouth every 6 (six) hours as needed for mild pain.    [provider]  Amino Acids-Protein Hydrolys (FEEDING SUPPLEMENT, PRO-STAT SUGAR FREE 64,) LIQD Take 30 mLs by mouth 2 (two) times daily. 06/20/18   [place] Eric J, DO  amLODipine (NORVASC) 10 MG tablet Take 10 mg by mouth daily.     [provider]  calcitRIOL (ROCALTROL) 0.25 MCG capsule Take 1 capsule (0.25 mcg total) by mouth every Monday, Wednesday, and Friday. Patient taking differently: Take 0.25 mcg by mouth daily.  12/05/16   Velvet Bathe, MD  cyclobenzaprine (FLEXERIL) 10 MG tablet Take 10 mg 3 (three) times daily as needed by mouth for muscle spasms.    [provider]  docusate sodium (COLACE) 100 MG capsule Take 1 capsule (100 mg total) by mouth daily. 09/22/18 10/22/18  Donne Hazel, MD  feeding supplement, GLUCERNA SHAKE, (GLUCERNA SHAKE) LIQD Take 237 mLs by mouth 2 (two) times daily between meals. 08/02/18   Mikhail, Velta Addison, DO  folic acid (FOLVITE) 017 MCG tablet Take 400 mcg by mouth daily.    [provider]  hydrALAZINE (APRESOLINE) 50 MG tablet Take 1 tablet (50 mg total) by mouth every 6 (six) hours. 09/21/18 10/21/18  Donne Hazel, MD  Insulin Aspart (NOVOLOG FLEXPEN [place]) Inject 0-10 Units into the skin See admin instructions. Per Sliding Scale ar 0600, 1100, 1600, 2100 100-150 0units 151-200 2units 201-250 4units 251-300 6units 301-350 8units 351-400 10units    [provider]  insulin aspart (NOVOLOG) 100 UNIT/ML injection Inject 6 Units into the skin 3 (three) times daily with meals. 06/20/18   [place] Eric J, DO  insulin glargine (LANTUS) 100 UNIT/ML injection Inject 0.15 mLs (15 Units total) into the skin at bedtime. 08/02/18   Mikhail, Velta Addison, DO  ipratropium-albuterol (DUONEB) 0.5-2.5 (3) MG/3ML SOLN Take 3 mLs by nebulization every 6 (six) hours as needed (SOB, wheezing).    [provider]  iron  polysaccharides (NIFEREX) 150 MG capsule Take 150 mg by mouth 2 (two) times daily.    [provider]  isosorbide mononitrate (IMDUR) 30 MG 24 hr tablet Take 1 tablet (30 mg total) by mouth daily. 09/22/18   Donne Hazel, MD  magnesium oxide (MAG-OX) 400 MG tablet Take 400 mg by mouth daily.    [provider]  montelukast (SINGULAIR) 10 MG tablet Take 1 tablet (10 mg total) by mouth at bedtime. 10/05/16   Chesley Mires, MD  Multiple Vitamins-Minerals (MULTIVITAMIN ADULT PO) Take 1 tablet by mouth daily.    [provider]  multivitamin (PROSIGHT) TABS tablet Take 1 tablet by mouth daily. Patient not taking: Reported on 09/03/2018 08/02/18   Cristal Ford, DO  nitroGLYCERIN (NITROSTAT) 0.4 MG SL tablet Place 0.4 mg under the tongue every 5 (five) minutes as needed for chest pain.    [provider]  Omega-3 Fatty Acids (FISH OIL) 1000 MG CAPS Take 1,000 mg by mouth daily.     [provider]  oxyCODONE-acetaminophen (PERCOCET/ROXICET) 5-325 MG tablet Take 1 tablet by mouth every 6 (six) hours as needed for severe pain. Prescription needed for SNF placement 09/21/18   Donne Hazel, MD  pantoprazole (PROTONIX) 40 MG tablet Take 1 tablet (40 mg total) by mouth daily at 6 (six) AM. 08/03/18   Mikhail, Velta Addison, DO  Propylene Glycol (SYSTANE BALANCE) 0.6 % SOLN Apply 1 drop to eye 2 (two) times daily.     [provider]  pyridOXINE (VITAMIN B-6) 100 MG tablet Take 100 mg by mouth daily.    [provider]  senna (SENOKOT) 8.6 MG TABS tablet Take 1 tablet (8.6 mg total) by mouth 2 (two) times daily. 09/21/18 10/21/18  Donne Hazel, MD  torsemide (DEMADEX) 10 MG tablet Take 2 tablets (20 mg total) by mouth 2 (two) times daily. 07/09/18   Domenic Polite, MD  traZODone (DESYREL) 50 MG tablet Take 50 mg by mouth at bedtime.     [provider]  vitamin B-12 (CYANOCOBALAMIN) 1000 MCG tablet Take 1,000 mcg by mouth daily.    [provider]  Vitamin D, Ergocalciferol, (DRISDOL) 50000 UNITS CAPS capsule Take 50,000 Units by mouth 2 (two) times a week. MON and THUR 04/07/14   [provider]  vitamin E 400 UNIT capsule Take 400 Units by mouth daily.    [provider]  ZOCOR 20 MG tablet Take 20 mg by mouth daily. 03/16/14   [provider]    Physical Exam:  Constitutional: Elderly female who appears to be in some respiratory distress Vitals:   10/04/18 1245 10/04/18 1300 10/04/18 1315 10/04/18 1330  BP: (!) 92/50 (!) 87/55 (!) 92/56 (!) 90/55  Pulse: 67 66 66 67  Resp: (!) 34 (!) 33 (!) 32 (!) 31  Temp:      TempSrc:      SpO2: 91% (!) 84% (!) 85% (!) 76%  Weight:      Height:       Eyes: Left exophthalmos ENMT: Mucous membranes are dry. Posterior pharynx clear of any exudate or lesions.  Neck: normal, supple, no masses, no thyromegaly Respiratory: Tachypneic with positive crackles appreciated in both lung fields currently on a nonrebreather Cardiovascular: Regular rate and rhythm, no murmurs / rubs / gallops.  Positive lower extremity edema. 2+ pedal pulses. No carotid bruits.  Abdomen: no tenderness, no masses palpated. No hepatosplenomegaly. Bowel sounds positive.  Musculoskeletal: no clubbing / cyanosis. No joint deformity upper and lower extremities. Good ROM, no contractures. Normal muscle tone.  Skin: Venous stasis changes of the bilateral lower extremities.  Wounds were wrapped of the bilateral distal lower extremities.  With pressure ulcers noted of the left side. Neurologic: CN 2-12 grossly intact.  Able to move all extremities Psychiatric: Lethargic but well arouse to verbal stimuli and answer appropriately.. Normal mood.     Labs on Admission: I have personally reviewed following labs and imaging studies  CBC: Recent Labs  Lab 10/04/18 1115 10/04/18 1200  WBC 7.8  --   HGB 10.1* 10.2*  HCT 36.4 30.0*  MCV 105.8*  --   PLT 232  --    Basic Metabolic Panel:  Recent Labs  Lab 10/04/18 1115 10/04/18 1200  NA 143 143  K 6.5* 6.2*  CL 107  --   CO2 23  --   GLUCOSE 179*  --  BUN 140*  --   CREATININE 4.42*  --   CALCIUM 8.9  --    GFR: Estimated Creatinine Clearance: 11.8 mL/min (A) (by C-G formula based on SCr of 4.42 mg/dL (H)). Liver Function Tests: Recent Labs  Lab 10/04/18 1115  AST 187*  ALT 140*  ALKPHOS 92  BILITOT 0.5  PROT 7.6  ALBUMIN 3.1*   No results for input(s): LIPASE, AMYLASE in the last 168 hours. No results for input(s): AMMONIA in the last 168 hours. Coagulation Profile: No results for input(s): INR, PROTIME in the last 168 hours. Cardiac Enzymes: No results for input(s): CKTOTAL, CKMB, CKMBINDEX, TROPONINI in the last 168 hours. BNP (last 3 results) No results for input(s): PROBNP in the last 8760 hours. HbA1C: No results for input(s): HGBA1C in the last 72 hours. CBG: No results for input(s): GLUCAP in the last 168 hours. Lipid Profile: No results for input(s): CHOL, HDL, LDLCALC, TRIG, CHOLHDL, LDLDIRECT in the last 72 hours. Thyroid Function Tests: No results for input(s): TSH, T4TOTAL, FREET4, T3FREE, THYROIDAB in the last 72 hours. Anemia Panel: No results for input(s): VITAMINB12, FOLATE, FERRITIN, TIBC, IRON, RETICCTPCT in the last 72 hours. Urine analysis:    Component Value Date/Time   COLORURINE STRAW (A) 07/23/2018 1656   APPEARANCEUR CLEAR 07/23/2018 1656   LABSPEC 1.011 07/23/2018 1656   PHURINE 5.0 07/23/2018 1656   GLUCOSEU NEGATIVE 07/23/2018 1656   HGBUR NEGATIVE 07/23/2018 1656   BILIRUBINUR NEGATIVE 07/23/2018 1656   KETONESUR NEGATIVE 07/23/2018 1656   PROTEINUR NEGATIVE 07/23/2018 1656   UROBILINOGEN 0.2 12/16/2011 1717   NITRITE NEGATIVE 07/23/2018 1656   LEUKOCYTESUR NEGATIVE 07/23/2018 1656   Sepsis Labs: No results found for this or any previous visit (from the past 240 hour(s)).   Radiological Exams on Admission: Dg Chest Port 1 View  Result Date: 10/04/2018  CLINICAL DATA:  Dyspnea. EXAM: PORTABLE CHEST 1 VIEW COMPARISON:  Radiographs of September 19, 2018. FINDINGS: Stable cardiomegaly. Atherosclerosis of thoracic aorta is noted. No pneumothorax is noted. Mild bilateral perihilar and basilar opacities are noted concerning for possible edema or possibly multifocal pneumonia. Minimal bilateral pleural effusions are noted. Degenerative changes are noted in the left glenohumeral joint. IMPRESSION: Stable cardiomegaly is noted. Mild bilateral perihilar and basilar opacities are noted concerning for possible edema or possibly multifocal pneumonia. Aortic Atherosclerosis (ICD10-I70.0). Electronically Signed   By: Marijo Conception M.D.   On: 10/04/2018 12:15    EKG: Independently reviewed.  Sinus rhythm at 73 bpm with signs of peaked T waves.  Assessment/Plan Respiratory failure with hypoxia and hypercapnia, COPD exacerbation: Acute on chronic.  Patient presents with complaints of shortness of breath and lethargy.  ABG revealing pH of 7.190, PCO2 76, and PO2 69.  Chest x-ray showing bilateral opacities concerning for edema versus infiltrate.  Zosyn have been given empirically for concern for a multifocal pneumonia.  Patient on nonrebreather without significant improvement in O2 saturations.  Clinically appears less likely infectious in nature and more likely related to edema.  After discussions with the patient at bedside she wants to be intubated if needed. -Admit to a progressive bed -Continuous pulse oximetry with nasal cannula oxygen as needed to maintain O2 saturations -Neurochecks -Follow-up blood cultures -Follow-up procalcitonin level and determine if continue antibiotics warranted -N.P.O -Aspiration precaution -BiPAP as needed -DuoNebs 4 times daily as needed shortness of breath/wheezing. -PCCM consulted    Diastolic congestive heart failure exacerbation: Acute on chronic.  Patient appears to be fluid overloaded.  BNP elevated at  332.  Last EF in June  2020 noted to be >65%. -Strict intake and output -Daily weights -Insert Foley cath -Lasix 40 mg IV twice daily  Hypotension: Acute.  Patient's blood pressures 85/657 ~104/58.  Patient appears likely fluid overloaded.  Would hold off IV fluids at this time. -PCCM cosulted  Acute renal failure superimposed on chronic kidney disease stage IV: At baseline patient's creatinine previously noted to be around 2.3-2.7.  Patient presents with creatinine elevated up to 4.42 with BUN 140.  Suspect aspect of symptoms of hypoperfusion. -Check bladder scan -Check urinalysis, urine sodium, urine creatinine, and urine urea -Continue to monitor kidney function with diuresis  Hyperkalemia: Acute.  Potassium 6.5 on admission. -Giving Lasix IV -Continue to monitor potassium levels  Diabetes mellitus type 2 -hypoglycemic protocols -CBGs every 6 hours with sensitive SSI -This regimen as needed  Pressure wounds -Low-air-loss mattress replacement  DVT prophylaxis: Heparin Code Status: Limited Family Communication: Discussed plan of care with the niece present at bedside overall patient with poor prognosis. Disposition Plan: To be determined Consults called: None Admission status: Inpatient  Norval Morton MD [facility] Pager [phone]   If 7PM-7AM, please contact night-coverage [email or web address] Password [email or web address]  10/04/2018, 2:00 PM

## 2018-10-04 NOTE — Progress Notes (Signed)
RT NOTE: RT attempted to obtain ABG per Dr.Smith but RT unsuccessful. Per Dr.Yacoub, discontinue arterial blood gas at this time. RT will continue to monitor.

## 2018-10-04 NOTE — Consult Note (Signed)
NAME:  [name], MRN:  [id number], DOB:  05-Jan-1941, LOS: 0 ADMISSION DATE:  10/04/2018, CONSULTATION DATE:  10/04/2018 REFERRING MD:  Nile Riggs Tamala Julian, CHIEF COMPLAINT:  Acute on chronic respiratory failure   Brief History   78 year old female with diastolic heart failure and CRI who presents to PCCM with respiratory failure due to pulmonary edema from worsening renal failure.  Patient was originally DNR and was seen by palliative care.  The patient subsequently woke up and reversed her own DNR and is being upheld by the niece who is her only living relative and PCCM was called on consultation.  The patient is "tired" and not answering questions.  Patient also has COPD and DM.  She was found by SNF staff with AMS.  Recently hospitalized from 8/24 to 9/11 for CHF and pulmonary edema requiring intubation.  In the ED the patient was admitted by the hospitalist but since DNR was rescinded PCCM was called on consultation.  History of present illness   78 year old female with diastolic heart failure and CRI who presents to PCCM with respiratory failure due to pulmonary edema from worsening renal failure.  Patient was originally DNR and was seen by palliative care.  The patient subsequently woke up and reversed her own DNR and is being upheld by the niece who is her only living relative and PCCM was called on consultation.  The patient is "tired" and not answering questions.  Patient also has COPD and DM.  She was found by SNF staff with AMS.  Recently hospitalized from 8/24 to 9/11 for CHF and pulmonary edema requiring intubation.  In the ED the patient was admitted by the hospitalist but since DNR was rescinded PCCM was called on consultation.  Past Medical History  CHF COPD CRI  Significant Hospital Events   9/24>>>Admission to the hospital  Consults:  PCCM Renal  Procedures:  None  Significant Diagnostic Tests:  CXR with pulmonary edema  Micro Data:  Blood 9/24>>> Urine 9/24>>> Sputum  9/24>>>  Antimicrobials:  None  Interim history/subjective:  C/O being tired and feeling SOB  Objective   Blood pressure (!) 91/47, pulse 63, temperature (!) 97.5 F (36.4 C), temperature source Axillary, resp. rate (!) 26, height 5\' 4"  (1.626 m), weight 90.7 kg, SpO2 (!) 88 %.       No intake or output data in the 24 hours ending 10/04/18 1648 Filed Weights   10/04/18 1112  Weight: 90.7 kg    Examination: General: Acute on chronically ill appearing female, moderate respiratory distress HENT: [place]/AT, PERRL, EOM-I and DMM Lungs: Diffuse crackles Cardiovascular: RRR, Nl S1/S2 and -M/R/G Abdomen: Soft, distended, NT and +BS Extremities: Lymphedema noted and valgus Neuro: Awake but lethargic, moving all ext to command and protecting her airway Skin: Bilateral lower ext edema and skin tears  I reviewed CXR myself, pulmonary edema noted with low volume.  Resolved Hospital Problem list   N/A  Assessment & Plan:  78 year old female with PMH COPD/CHF/CKD who presents to PCCM with respiratory failure and acute on chronic renal failure.  The patient is developing respiratory failure due to fluid overload and renal failure.  Discussed with nephrology.  Acute respiratory failure:  - Intubate  - Full vent support  - Adjust vent for ABG  - Short term intubation only  Acute on chronic renal failure:  - Renal consult  - Place trialysis catheter  - Begin CRRT  - BMET in AM  - Address electrolytes  as indicated  Cardiogenic shock:  - Levophed  - Place TLC  - Follow CVP  - Hold home anti-HTN  Goals of care:  - Spent over an hour speaking with patient and niece.  Patient was DNR earlier today and when in the SNF but now the patient woke up and reversed her code status to full code.  I spoke with renal, I feel that this is driven by worsening renal failure.  If patient is not a dialysis candidate then will insist on DNR.  She is speaking with patient now.  Labs   CBC: Recent Labs   Lab 10/04/18 1115 10/04/18 1200  WBC 7.8  --   HGB 10.1* 10.2*  HCT 36.4 30.0*  MCV 105.8*  --   PLT 232  --     Basic Metabolic Panel: Recent Labs  Lab 10/04/18 1115 10/04/18 1200  NA 143 143  K 6.5* 6.2*  CL 107  --   CO2 23  --   GLUCOSE 179*  --   BUN 140*  --   CREATININE 4.42*  --   CALCIUM 8.9  --    GFR: Estimated Creatinine Clearance: 11.8 mL/min (A) (by C-G formula based on SCr of 4.42 mg/dL (H)). Recent Labs  Lab 10/04/18 1115  WBC 7.8    Liver Function Tests: Recent Labs  Lab 10/04/18 1115  AST 187*  ALT 140*  ALKPHOS 92  BILITOT 0.5  PROT 7.6  ALBUMIN 3.1*   No results for input(s): LIPASE, AMYLASE in the last 168 hours. No results for input(s): AMMONIA in the last 168 hours.  ABG    Component Value Date/Time   PHART 7.190 (LL) 10/04/2018 1200   PCO2ART 76.0 (HH) 10/04/2018 1200   PO2ART 69.0 (L) 10/04/2018 1200   HCO3 29.3 (H) 10/04/2018 1200   TCO2 32 10/04/2018 1200   ACIDBASEDEF 1.3 07/27/2018 0421   O2SAT 88.0 10/04/2018 1200     Coagulation Profile: No results for input(s): INR, PROTIME in the last 168 hours.  Cardiac Enzymes: No results for input(s): CKTOTAL, CKMB, CKMBINDEX, TROPONINI in the last 168 hours.  HbA1C: Hgb A1c MFr Bld  Date/Time Value Ref Range Status  06/16/2018 08:15 PM 9.9 (H) 4.8 - 5.6 % Final    Comment:    (NOTE)         Prediabetes: 5.7 - 6.4         Diabetes: >6.4         Glycemic control for adults with diabetes: <7.0   04/04/2017 02:04 AM 5.8 (H) 4.8 - 5.6 % Final    Comment:    (NOTE)         Prediabetes: 5.7 - 6.4         Diabetes: >6.4         Glycemic control for adults with diabetes: <7.0     CBG: No results for input(s): GLUCAP in the last 168 hours.  Review of Systems:   Unattainable  Past Medical History  She,  has a past medical history of Adenomatous colon polyp, Anemia, Arthritis, Asthma, COPD (chronic obstructive pulmonary disease) ([place]), COPD with asthma (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   Surgical History    Past Surgical History:  Procedure Laterality Date  . COLONOSCOPY  2209 or 2010  2001, 2005, 2009  colonoscopy.  [place]  . COLONOSCOPY N/A 08/14/2012   Procedure: COLONOSCOPY;  Surgeon: Ladene Artist, MD;  Location: [facility] ENDOSCOPY;  Service: Endoscopy;  Laterality: N/A;  . ESOPHAGOGASTRODUODENOSCOPY (EGD) WITH PROPOFOL N/A 07/25/2018   Procedure: ESOPHAGOGASTRODUODENOSCOPY (EGD) WITH PROPOFOL;  Surgeon: Gatha Mayer, MD;  Location: [facility];  Service: Endoscopy;  Laterality: N/A;  . INCISE AND DRAIN ABCESS     Abdominal Wall  . LIPOMA RESECTION     Right Neck  . TOTAL KNEE ARTHROPLASTY     Bilateral  . UMBILICAL HERNIA REPAIR  2007   incarcerated  . UPPER GASTROINTESTINAL ENDOSCOPY  2001   medoff Grade A esophagitis     Social History   reports that she quit smoking about 60 years ago. Her smoking use included cigarettes. She has a 10.00 pack-year smoking history. She has never used smokeless tobacco. She reports that she does not drink alcohol or use drugs.   Family History   Her family history includes Breast cancer in her maternal aunt.   Allergies Allergies  Allergen Reactions  . Other Other (See Comments)    "all generics make her sick"     Home Medications  Prior to Admission medications   Medication Sig Start Date End Date Taking? Authorizing Provider  acetaminophen (TYLENOL) 325 MG tablet Take 650 mg by mouth every 6 (six) hours as needed for mild pain.   Yes [provider]  amLODipine (NORVASC) 10 MG tablet Take 10 mg by mouth daily.    Yes [provider]  bisacodyl  (DULCOLAX) 10 MG suppository Place 10 mg rectally daily as needed for moderate constipation.   Yes [provider]  calcitRIOL (ROCALTROL) 0.25 MCG capsule Take 1 capsule (0.25 mcg total) by mouth every Monday, Wednesday, and Friday. Patient taking differently: Take 0.25 mcg by mouth daily.  12/05/16  Yes Velvet Bathe, MD  cyclobenzaprine (FLEXERIL) 10 MG tablet Take 10 mg 3 (three) times daily as needed by mouth for muscle spasms.   Yes [provider]  docusate sodium (COLACE) 100 MG capsule Take 1 capsule (100 mg total) by mouth daily. 09/22/18 10/22/18 Yes Donne Hazel, MD  feeding supplement, GLUCERNA SHAKE, (GLUCERNA SHAKE) LIQD Take 237 mLs by mouth 2 (two) times daily between meals. 08/02/18  Yes Mikhail, Velta Addison, DO  folic acid (FOLVITE) 875 MCG tablet Take 400 mcg by mouth daily.   Yes [provider]  hydrALAZINE (APRESOLINE) 50 MG tablet Take 1 tablet (50 mg total) by mouth every 6 (six) hours. 09/21/18 10/21/18 Yes Donne Hazel, MD  Insulin Aspart (NOVOLOG FLEXPEN [place]) Inject 0-10 Units into the skin See admin instructions. Per Sliding Scale ar 0600, 1100, 1600, 2100 100-150 0units 151-200 2units 201-250 4units 251-300 6units 301-350 8units 351-400 10units   Yes [provider]  insulin aspart (NOVOLOG) 100 UNIT/ML injection Inject 6 Units into the skin 3 (three) times daily with meals. 06/20/18  Yes [place] Eric J, DO  insulin glargine (LANTUS) 100 UNIT/ML injection Inject 0.15 mLs (15 Units total) into the skin at bedtime. 08/02/18  Yes Mikhail, Maryann, DO  ipratropium-albuterol (DUONEB) 0.5-2.5 (3) MG/3ML SOLN Take 3 mLs by nebulization every 6 (six) hours as needed (SOB, wheezing).   Yes [provider]  iron polysaccharides (NIFEREX) 150 MG capsule Take 150 mg by mouth 2 (two) times daily.   Yes [provider]  isosorbide mononitrate (IMDUR) 30 MG 24 hr tablet Take 1 tablet (30 mg total) by mouth  daily. 09/22/18  Yes Donne Hazel, MD  magnesium oxide (MAG-OX) 400 MG tablet Take 400 mg by mouth daily.   Yes [provider]  montelukast (SINGULAIR) 10 MG tablet Take 1 tablet (10 mg total) by mouth at bedtime. 10/05/16  Yes Chesley Mires, MD  Multiple Vitamins-Minerals (MULTIVITAMIN ADULT PO) Take 1 tablet by mouth daily.   Yes [provider]  nitroGLYCERIN (NITROSTAT) 0.4 MG SL tablet Place 0.4 mg under the tongue every 5 (five) minutes as needed for chest pain.   Yes [provider]  Omega-3 Fatty Acids (FISH OIL) 1000 MG CAPS Take 1,000 mg by mouth daily.    Yes [provider]  ondansetron (ZOFRAN) 4 MG tablet Take 4 mg by mouth every 6 (six) hours as needed for nausea or vomiting.   Yes [provider]  oxyCODONE-acetaminophen (PERCOCET/ROXICET) 5-325 MG tablet Take 1 tablet by mouth every 6 (six) hours as needed for severe pain. Prescription needed for SNF placement 09/21/18  Yes Donne Hazel, MD  OXYGEN Inhale 3 L into the lungs See admin instructions. 3L every minute per nasal cannula   Yes [provider]  pantoprazole (PROTONIX) 40 MG tablet Take 1 tablet (40 mg total) by mouth daily at 6 (six) AM. 08/03/18  Yes Mikhail, Velta Addison, DO  polyethylene glycol (MIRALAX / GLYCOLAX) 17 g packet Take 17 g by mouth daily.   Yes [provider]  Propylene Glycol (SYSTANE BALANCE) 0.6 % SOLN Apply 1 drop to eye 2 (two) times daily.    Yes [provider]  pyridOXINE (VITAMIN B-6) 100 MG tablet Take 100 mg by mouth daily.   Yes [provider]  senna (SENOKOT) 8.6 MG TABS tablet Take 1 tablet (8.6 mg total) by mouth 2 (two) times daily. 09/21/18 10/21/18 Yes Donne Hazel, MD  traZODone (DESYREL) 50 MG tablet Take 50 mg by mouth at bedtime.    Yes [provider]  vitamin B-12 (CYANOCOBALAMIN) 1000 MCG tablet Take 1,000 mcg by mouth daily.   Yes [provider]  Vitamin D, Ergocalciferol, (DRISDOL) 50000 UNITS CAPS capsule  Take 50,000 Units by mouth 2 (two) times a week. MON and THUR 04/07/14  Yes [provider]  vitamin E 400 UNIT capsule Take 400 Units by mouth daily.   Yes [provider]  ZOCOR 20 MG tablet Take 20 mg by mouth daily. 03/16/14  Yes [provider]  Amino Acids-Protein Hydrolys (FEEDING SUPPLEMENT, PRO-STAT SUGAR FREE 64,) LIQD Take 30 mLs by mouth 2 (two) times daily. 06/20/18   [place] Donnamarie Poag, DO  multivitamin (PROSIGHT) TABS tablet Take 1 tablet by mouth daily. Patient not taking: Reported on 09/03/2018 08/02/18   Cristal Ford, DO  torsemide (DEMADEX) 10 MG tablet Take 2 tablets (20 mg total) by mouth 2 (two) times daily. Patient taking differently: Take 30 mg by mouth every 12 (twelve) hours as needed (fluid retention).  07/09/18   Domenic Polite, MD    The patient is critically ill with multiple organ systems failure and requires high complexity decision making for assessment and support, frequent evaluation and titration of therapies, application of advanced monitoring technologies and extensive interpretation of multiple databases.   Critical Care Time devoted to patient care services described in this note is  60  Minutes. This time reflects time of care of this signee Dr [name]. This critical care time does not reflect procedure time, or teaching time or supervisory time of PA/NP/Med student/Med Resident etc  but could involve care discussion time.  Rush Farmer, M.D. [facility] Pulmonary/Critical Care Medicine. Pager: [phone]. After hours pager: [phone].

## 2018-10-04 NOTE — Progress Notes (Signed)
Niece [name] arrived in ED department.  At bedside discussion with [facility] Dr. Harvest Forest for update in condition.  Patient has become more cognitively alert, she acknowledged [name]'s presence and was able to verbalize her name.  Discussion moved toward options for worsening respiratory status. Niece has discussed with other family members by phone since patients admission to ED today.  Conversation turned toward patient as she was aware of the discussion taking place.  She was able to verbalize her wishes to her niece that if intubation, at tube to help her breath was necessary, she wanted to give it a chance.  It is very evident that [name] and her niece have a very close bond  [name] also verbalized that should she be unable to speak for herself, her Niece is her medical advocate.  Will follow as inpatient for support.  Nicole Fantasia, MSN, RN-BC, [facility], [facility] Palliative Clinical Specialist

## 2018-10-04 NOTE — ED Notes (Signed)
Family at bedside. 

## 2018-10-04 NOTE — Progress Notes (Addendum)
Spoke with renal.  Patient was followed by Dr. Lorrene Reid from nephrology who on multiple occassions have said patient is not a dialysis candidate due to mobility and transportation concern as well as overall quality of life.  I went back in the room with Dr. Royce Macadamia who evaluated the patient and agrees that patient is not a dialysis candidate.  Given that the reason for respiratory failure is fluid overload and that without dialysis fluid overload can not be addressed then the cause of respiratory failure can not be addressed and patient only wanted short term intubation not long term then will not offer intubation or pressor support.  Will offer BiPAP and diuretics and if unsuccessful then will transition to comfort.  The patient's niece was informed of that and that we are changing her back to a full DNR and she expressed understanding and will inform the rest of the family.  Dr. Tamala Julian informed of the events of the day.  PCCM will sign off, please call back if needed.  The patient is critically ill with multiple organ systems failure and requires high complexity decision making for assessment and support, frequent evaluation and titration of therapies, application of advanced monitoring technologies and extensive interpretation of multiple databases.   Critical Care Time devoted to patient care services described in this note is  35  Minutes. This time reflects time of care of this signee Dr [name]. This critical care time does not reflect procedure time, or teaching time or supervisory time of PA/NP/Med student/Med Resident etc but could involve care discussion time.  Rush Farmer, M.D. [facility] Pulmonary/Critical Care Medicine. Pager: [phone]. After hours pager: [phone].

## 2018-10-04 NOTE — Progress Notes (Signed)
RT NOTE: RT obtained ABG per MD order. RT reported critical results to RN. On NRB ABG results are as follows: pH 7.19, CO2 76, PO2 69, HCO3 29.3 and sat 88%. RT waiting for new orders. RT will continue to monitor as needed.

## 2018-10-05 ENCOUNTER — Inpatient Hospital Stay (HOSPITAL_COMMUNITY): Payer: Medicare Other

## 2018-10-05 LAB — BASIC METABOLIC PANEL
Anion gap: 12 (ref 5–15)
BUN: 148 mg/dL — ABNORMAL HIGH (ref 8–23)
CO2: 25 mmol/L (ref 22–32)
Calcium: 9 mg/dL (ref 8.9–10.3)
Chloride: 107 mmol/L (ref 98–111)
Creatinine, Ser: 4.88 mg/dL — ABNORMAL HIGH (ref 0.44–1.00)
GFR calc Af Amer: 9 mL/min — ABNORMAL LOW (ref >60)
GFR calc non Af Amer: 8 mL/min — ABNORMAL LOW (ref >60)
Glucose, Bld: 136 mg/dL — ABNORMAL HIGH (ref 70–99)
Potassium: 6.7 mmol/L (ref 3.5–5.1)
Sodium: 144 mmol/L (ref 135–145)

## 2018-10-05 LAB — CBC WITH DIFFERENTIAL/PLATELET
Abs Immature Granulocytes: 0.03 10*3/uL (ref 0.00–0.07)
Basophils Absolute: 0 10*3/uL (ref 0.0–0.1)
Basophils Relative: 0 %
Eosinophils Absolute: 0 10*3/uL (ref 0.0–0.5)
Eosinophils Relative: 0 %
HCT: 29.4 % — ABNORMAL LOW (ref 36.0–46.0)
Hemoglobin: 9.1 g/dL — ABNORMAL LOW (ref 12.0–15.0)
Immature Granulocytes: 0 %
Lymphocytes Relative: 6 %
Lymphs Abs: 0.4 10*3/uL — ABNORMAL LOW (ref 0.7–4.0)
MCH: 29.6 pg (ref 26.0–34.0)
MCHC: 31 g/dL (ref 30.0–36.0)
MCV: 95.8 fL (ref 80.0–100.0)
Monocytes Absolute: 0.1 10*3/uL (ref 0.1–1.0)
Monocytes Relative: 2 %
Neutro Abs: 6.3 10*3/uL (ref 1.7–7.7)
Neutrophils Relative %: 92 %
Platelets: 167 10*3/uL (ref 150–400)
RBC: 3.07 MIL/uL — ABNORMAL LOW (ref 3.87–5.11)
RDW: 18.1 % — ABNORMAL HIGH (ref 11.5–15.5)
WBC: 6.9 10*3/uL (ref 4.0–10.5)
nRBC: 0 % (ref 0.0–0.2)

## 2018-10-05 LAB — GLUCOSE, CAPILLARY
Glucose-Capillary: 129 mg/dL — ABNORMAL HIGH (ref 70–99)
Glucose-Capillary: 137 mg/dL — ABNORMAL HIGH (ref 70–99)
Glucose-Capillary: 109 mg/dL — ABNORMAL HIGH (ref 70–99)
Glucose-Capillary: 129 mg/dL — ABNORMAL HIGH (ref 70–99)
Glucose-Capillary: 122 mg/dL — ABNORMAL HIGH (ref 70–99)

## 2018-10-05 LAB — MAGNESIUM: Magnesium: 2.7 mg/dL — ABNORMAL HIGH (ref 1.7–2.4)

## 2018-10-05 LAB — PROCALCITONIN: Procalcitonin: 0.27 ng/mL

## 2018-10-05 LAB — PHOSPHORUS: Phosphorus: 6.4 mg/dL — ABNORMAL HIGH (ref 2.5–4.6)

## 2018-10-05 MED ORDER — INSULIN ASPART 100 UNIT/ML ~~LOC~~ SOLN
0.0000 [IU] | Freq: Four times a day (QID) | SUBCUTANEOUS | Status: DC
  Administered 2018-10-06 – 2018-10-07 (×4): 1 [IU] via SUBCUTANEOUS

## 2018-10-05 MED ORDER — CALCIUM GLUCONATE-NACL 1-0.675 GM/50ML-% IV SOLN
1.0000 g | Freq: Once | INTRAVENOUS | Status: AC
  Administered 2018-10-05: 06:00:00 1000 mg via INTRAVENOUS
  Filled 2018-10-05: qty 50

## 2018-10-05 MED ORDER — INSULIN ASPART 100 UNIT/ML IV SOLN
10.0000 [IU] | Freq: Once | INTRAVENOUS | Status: AC
  Administered 2018-10-05: 10 [IU] via INTRAVENOUS

## 2018-10-05 MED ORDER — SODIUM POLYSTYRENE SULFONATE 15 GM/60ML PO SUSP
30.0000 g | Freq: Once | ORAL | Status: DC
  Filled 2018-10-05: qty 120

## 2018-10-05 MED ORDER — DEXTROSE 50 % IV SOLN
1.0000 | Freq: Once | INTRAVENOUS | Status: AC
  Administered 2018-10-05: 50 mL via INTRAVENOUS
  Filled 2018-10-05: qty 50

## 2018-10-05 MED ORDER — HYDROMORPHONE HCL 1 MG/ML IJ SOLN: 0.5000 mg | INTRAMUSCULAR | Status: DC | PRN

## 2018-10-05 NOTE — Progress Notes (Signed)
Patient has no urine output after given the iv  lasix 60 mg  Bladder scan volume 79 ml. Notified on call [facility] no new order received this time. Will continue monitor the patient.

## 2018-10-05 NOTE — Progress Notes (Signed)
   Vital Signs MEWS/VS Documentation      10/05/2018 0351 10/05/2018 0356 10/05/2018 0435 10/05/2018 0559   MEWS Score:  2  2  2  2    MEWS Score Color:  Yellow  Yellow  Yellow  Yellow   Resp:  (!) 26  -  -  -   Pulse:  (!) 57  -  -  63   BP:  -  -  -  (!) 127/56   Temp:  -  -  97.8 F (36.6 C)  97.7 F (36.5 C)   O2 Device:  Bi-PAP  -  -  -   FiO2 (%):  30 %  -  -  -   Level of Consciousness:  -  Alert  -  -      No acute change in patient's condition at this time     [name]  [name] 10/05/2018,7:35 AM

## 2018-10-05 NOTE — Consult Note (Signed)
[name] is unresponsive today to any stimuli, labs worsening. She does not show signs of distress or suffering.. It appears that she has become comatose.  VS stable at this point, BP 130's/70's HR 50-60's, no UOP today, appears more edematous.    Unfortunately to goals of care discussed at length with niece remain to give [name] every chance at recovery as she has been in this condition before (per niece) she understands multi system organ failure but cannot agree to remove the bi-pap.  Expect hospital death within hours to maybe a few days.  Order received for comfort medication and discussed with Dr. [name]  should patient shows signs of distress or suffering.  As above patient is not responsive to stimuli of any kind.    Kizzie Fantasia, MSN, RN-BC, [facility], [facility] Palliative Clinical Specialist

## 2018-10-05 NOTE — Procedures (Signed)
Attempted AM ABG however patient is a hard stick with very edematous arms. Venous blood was obtained.   RN Sarala, notified of attempts trying to get ABG.  Provider notified of inability to obtain arterial blood. Will await further orders.

## 2018-10-05 NOTE — Progress Notes (Signed)
   Vital Signs MEWS/VS Documentation      10/05/2018 1122 10/05/2018 1200 10/05/2018 1202 10/05/2018 1515   MEWS Score:  2  3  2  2    MEWS Score Color:  Yellow  Yellow  Yellow  Yellow   Pulse:  62  -  62  61   BP:  -  -  130/61  -   Temp:  -  -  97.9 F (36.6 C)  -   O2 Device:  Bi-PAP  -  -  Bi-PAP   FiO2 (%):  30 %  -  -  30 %   Level of Consciousness:  -  Responds to Voice  -  -     No acute change in patients condition at this time      Rance Muir 10/05/2018,3:43 PM

## 2018-10-05 NOTE — Progress Notes (Signed)
[facility] Progress Note   78 y.o. female with a history of CKD stage IV (Baseline Cr 2-3 recently) and severe chronic diastolic CHF who presented from her SNF altered and with respiratory distress.  Palliative care has seen her.  She was DNR and this was rescinded yesterday.  Pulmonology was consulted for possible intubation and is planning for BIPAP for now.  AKI on CKD with Cr 4.42 and BUN 140.  K 6.2.  She follows with Dr. Lorrene Reid at [facility] and last saw her in 03/2018.  The patient had indicated during appointments that she would not want dialysis should her CKD progress to that point. Assessment/ Plan:    # AKI on CKD IV - Secondary to ischemic and pre-renal insults and possible component of CKD progression - Now with hypotension  - Holding anti-hypertensives to avoid ischemic insults - Has previously stated that she would not want hemodialysis if her CKD progressed to that point and I do not think she will tolerate iHD. - Poor response to Lasix. She appears to be headed to comfort care which is appropriate.Will sign off at this time.   # Acute hypoxic and hypercapneic respiratory failure  - Pulmonology consulted   - They are planning for BIPAP for now  - Previously DNR, this was rescinded earlier today  - Discussed with pulmonology.  Intubation was held in conjunction with decision re: dialysis.  Pulm spoke with family during my exam and they recommended transition to comfort measures should non-invasive measures fail to help her  - Abx per primary team  - Failed Lasix 60 mg IV   # Chronic diastolic CHF, moderate AS - s/p IV fluids in the IR, now off; received IV lasix but not responding  # Altered mental status - Improving and multifactorial with hypercapneic resp failure and likely uremia    Subjective:   Confused with mild dyspnea.   Objective:   BP 114/61   Pulse 63   Temp 98 F (36.7 C) (Axillary)   Resp (!) 26   Ht 5\' 4"  (1.626 m)   Wt  85.9 kg   SpO2 99%   BMI 32.51 kg/m   Intake/Output Summary (Last 24 hours) at 10/05/2018 1040 Last data filed at 10/05/2018 0900 Gross per 24 hour  Intake 83.54 ml  Output -  Net 83.54 ml   Weight change:   Physical Exam: HEENT: NCAT  Heart: S1S2 no rub Lungs: occ wheezing, on BiPAP Abdomen: S/NT/ND Extremities: 1+ edema upper extremities and trace edema lower extremities Skin: no rash on extremities exposed Neuro: somnolent    Imaging: US Renal  Result Date: 10/05/2018 CLINICAL DATA:  Acute kidney injury. EXAM: RENAL / URINARY TRACT ULTRASOUND COMPLETE COMPARISON:  11/24/16 FINDINGS: Right Kidney: Renal measurements: 8.6 x 4.2 x 4.4 cm = volume: 84 mL . Echogenicity within normal limits. No mass or hydronephrosis visualized. Left Kidney: Renal measurements: 8.5 x 5.5 x 4.4 cm = volume: 107 mL. Simple lower pole cyst measuring 12 mm. No hydronephrosis or solid mass. Bladder: Prominent bladder wall thickness circumferentially attributed to under distension. A pathologic finding. IMPRESSION: 1. No acute or reversible finding. 2. Symmetric renal size with no cortical thinning. Electronically Signed   By: Monte Fantasia M.D.   On: 10/05/2018 05:42   Dg Chest Port 1 View  Result Date: 10/05/2018 CLINICAL DATA:  78 year old female with a history of difficulty breathing EXAM: PORTABLE CHEST 1 VIEW COMPARISON:  October 04, 2010 FINDINGS: Cardiomediastinal silhouette unchanged in size  and contour. Calcifications of the aortic arch. No pneumothorax.  No pleural effusion. Fullness in the central vasculature with interlobular septal thickening, improved from the prior. No new confluent airspace disease. IMPRESSION: Similar appearance of cardiomegaly and mild edema, though improved from the prior. Electronically Signed   By: Corrie Mckusick D.O.   On: 10/05/2018 09:17   Dg Chest Port 1 View  Result Date: 10/04/2018 CLINICAL DATA:  Dyspnea. EXAM: PORTABLE CHEST 1 VIEW COMPARISON:  Radiographs of  September 19, 2018. FINDINGS: Stable cardiomegaly. Atherosclerosis of thoracic aorta is noted. No pneumothorax is noted. Mild bilateral perihilar and basilar opacities are noted concerning for possible edema or possibly multifocal pneumonia. Minimal bilateral pleural effusions are noted. Degenerative changes are noted in the left glenohumeral joint. IMPRESSION: Stable cardiomegaly is noted. Mild bilateral perihilar and basilar opacities are noted concerning for possible edema or possibly multifocal pneumonia. Aortic Atherosclerosis (ICD10-I70.0). Electronically Signed   By: Marijo Conception M.D.   On: 10/04/2018 12:15    Labs: BMET Recent Labs  Lab 10/04/18 1115 10/04/18 1200 10/04/18 1919 10/05/18 0326  NA 143 143 143 144  K 6.5* 6.2* 6.7* 6.7*  CL 107  --  107 107  CO2 23  --  23 25  GLUCOSE 179*  --  134* 136*  BUN 140*  --  143* 148*  CREATININE 4.42*  --  4.70* 4.88*  CALCIUM 8.9  --  9.0 9.0  PHOS  --   --   --  6.4*   CBC Recent Labs  Lab 10/04/18 1115 10/04/18 1200 10/05/18 0326  WBC 7.8  --  6.9  NEUTROABS  --   --  6.3  HGB 10.1* 10.2* 9.1*  HCT 36.4 30.0* 29.4*  MCV 105.8*  --  95.8  PLT 232  --  167    Medications:    . heparin  5,000 Units Subcutaneous Q8H  . hydrocortisone sodium succinate  50 mg Intravenous Q6H  . insulin aspart  0-9 Units Subcutaneous Q4H  . ipratropium-albuterol  3 mL Nebulization QID  . polyvinyl alcohol  1 drop Both Eyes BID  . sodium bicarbonate      . sodium polystyrene  30 g Rectal Once      Otelia Santee, MD 10/05/2018, 10:40 AM

## 2018-10-05 NOTE — Progress Notes (Signed)
CRITICAL VALUE ALERT  Critical Value:  Potassium 6.7  Date & Time Notied:  05/05/2018 at 0443  Provider Notified: Raliegh Ip Schorr  Orders Received/Actions taken: calcium glucoronate 1000 mg iv, D50 50ML iv,and novolog 10 unit.sq.

## 2018-10-05 NOTE — Plan of Care (Signed)

## 2018-10-05 NOTE — Progress Notes (Signed)
PROGRESS NOTE    Zariel Capano [facility]  RJJ:[id number] DOB: Jul 29, 1940 DOA: 10/04/2018 PCP: Garwin Brothers, MD   Brief Narrative:  Patient is a 78 year old female with history of CHF, COPD, diabetes type 2, anemia who presents with complaints of shortness of breath from [facility].  She was found to have generalized weakness, decreased level of consciousness at the nursing center.  She was hospitalized here on August and was discharged on 9/11 for CHF exacerbation requiring intubation.  She was found to be hypertensive , hypoxic on arrival.  She was placed on 100% nonrebreather.  ABG revealed pH of 7.19, PCO2 of 76, PO2 of 69.  Chest x-ray showed bilateral perihilar, basilar opacities concerning for edema/multifocal pneumonia.  PCCM, nephrology consulted on admission.  Nephrology did not offer her hemodialysis due to her poor prognosis.  PCCM did not want to intubate because she is not being dialyzed.  Patient made DNR after discussion with family.  Palliative care consulted.  Palliative care discussing goals of care with family.  She is a hospice candidate.  Currently on BiPAP.   Assessment & Plan:   Principal Problem:   Acute respiratory failure with hypoxia and hypercapnia (HCC) Active Problems:   COPD with asthma (HCC)   Acute on chronic heart failure with preserved ejection fraction (HCC)   Type 2 diabetes mellitus (HCC)   Hypotension   Acute respiratory failure with hypoxia/hypercapnia: Most likely secondary to volume overload.  Chest x-ray showed bilateral opacities concerning for edema/infiltrate.  Zosyn was also started empirically for concern for multifocal pneumonia.  COVID-19-.  Due to acute respiratory failure, there was plan for intubation but after consulting PCCM, patient was not intubated because she will not be dialyzed which would make it impossible to correct her respiratory failure.  Currently on BiPAP.  Patient is DNR.  Acute on chronic congestive heart failure exacerbation:  Appeared fluid overloaded on presentation.  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  Elevated BNP.  Last ejection fraction in June 2020 was more than 65%.  Foley catheter was inserted.  She was given Lasix  but she did not respond well. Nephrology signed off.  AKI on CKD stage IV: Baseline creatinine 2.3-2.7.  Follows with [facility].  Patient had previously refused dialysis in case it was needed.  Elevated creatinine with 4.42 on admission with elevated BUN of 140.  Volume overloaded.  She needed dialysis on presentation but due to her comorbidities and poor prognosis, nephrology did not offer dialysis.  Palliative care following  Hyperkalemia: Given Kayexalate today.  Diabetes type 2: Continue current insulin regimen  Pressure wounds: Continue supportive care.  Frequent turning.  Debility/deconditioning/poor prognosis/multiple comorbidities: Nursing home resident.  Nonambulatory.  Has chronic debility.  Has ulcers on bilateral feet.  She is hospice candidate.  Palliative care discussion with family about goals of care.  Since she has not been offered dialysis, she might succumb due to  respiratory failure due to fluid overload.         DVT prophylaxis:Heparin Code Status: DNR Family Communication: Called niece William Dalton on phone.I tried  to answer her questions.  I recommended that comfort care/residential hospice would be the best option for her.She will like to continue Bipap for now. Disposition Plan: Residential hospice versus hospital death   Consultants: Nephrology, Palliative care  Procedures: None  Antimicrobials:  Anti-infectives (From admission, onward)   Start     Dose/Rate Route Frequency Ordered Stop   10/04/18 1230  piperacillin-tazobactam (ZOSYN) IVPB 3.375 g  3.375 g 100 mL/hr over 30 Minutes Intravenous  Once 10/04/18 1219 10/04/18 1344      Subjective:  Patient seen and examined the bedside this morning.  Was in severe respiratory distress.  On  BiPAP.  Very weak, deconditioned.  She was arousable on calling her name and was actually oriented to place, time and person.  Objective: Vitals:   10/05/18 0748 10/05/18 0826 10/05/18 1122 10/05/18 1202  BP: 114/61   130/61  Pulse: 62 63 62 62  Resp:      Temp: 98 F (36.7 C)   97.9 F (36.6 C)  TempSrc: Axillary   Axillary  SpO2: 98% 99% 99% 100%  Weight:      Height:        Intake/Output Summary (Last 24 hours) at 10/05/2018 1408 Last data filed at 10/05/2018 0900 Gross per 24 hour  Intake 83.54 ml  Output -  Net 83.54 ml   Filed Weights   10/04/18 1112 10/04/18 2125  Weight: 90.7 kg 85.9 kg    Examination:  General exam: Generalized weakness, lethargic, obese HEENT: BiPAP Respiratory system: Bilateral decreased air entry Cardiovascular system: S1 & S2 heard, RRR. No JVD, murmurs, rubs, gallops or clicks.  Anasarca Gastrointestinal system: Abdomen is distended, soft and nontender. No organomegaly or masses felt.  Central nervous system: Alert and oriented.  Lethargic  extremities: Anasarca, no clubbing ,no cyanosis, ulcers on bilateral feet    Data Reviewed: I have personally reviewed following labs and imaging studies  CBC: Recent Labs  Lab 10/04/18 1115 10/04/18 1200 10/05/18 0326  WBC 7.8  --  6.9  NEUTROABS  --   --  6.3  HGB 10.1* 10.2* 9.1*  HCT 36.4 30.0* 29.4*  MCV 105.8*  --  95.8  PLT 232  --  937   Basic Metabolic Panel: Recent Labs  Lab 10/04/18 1115 10/04/18 1200 10/04/18 1919 10/05/18 0326  NA 143 143 143 144  K 6.5* 6.2* 6.7* 6.7*  CL 107  --  107 107  CO2 23  --  23 25  GLUCOSE 179*  --  134* 136*  BUN 140*  --  143* 148*  CREATININE 4.42*  --  4.70* 4.88*  CALCIUM 8.9  --  9.0 9.0  MG  --   --   --  2.7*  PHOS  --   --   --  6.4*   GFR: Estimated Creatinine Clearance: 10.4 mL/min (A) (by C-G formula based on SCr of 4.88 mg/dL (H)). Liver Function Tests: Recent Labs  Lab 10/04/18 1115  AST 187*  ALT 140*  ALKPHOS 92   BILITOT 0.5  PROT 7.6  ALBUMIN 3.1*   No results for input(s): LIPASE, AMYLASE in the last 168 hours. No results for input(s): AMMONIA in the last 168 hours. Coagulation Profile: No results for input(s): INR, PROTIME in the last 168 hours. Cardiac Enzymes: No results for input(s): CKTOTAL, CKMB, CKMBINDEX, TROPONINI in the last 168 hours. BNP (last 3 results) No results for input(s): PROBNP in the last 8760 hours. HbA1C: No results for input(s): HGBA1C in the last 72 hours. CBG: Recent Labs  Lab 10/04/18 1808 10/04/18 2304 10/05/18 0433 10/05/18 0747 10/05/18 1203  GLUCAP 121* 129* 129* 137* 109*   Lipid Profile: No results for input(s): CHOL, HDL, LDLCALC, TRIG, CHOLHDL, LDLDIRECT in the last 72 hours. Thyroid Function Tests: No results for input(s): TSH, T4TOTAL, FREET4, T3FREE, THYROIDAB in the last 72 hours. Anemia Panel: No results for input(s): VITAMINB12, FOLATE, FERRITIN,  TIBC, IRON, RETICCTPCT in the last 72 hours. Sepsis Labs: Recent Labs  Lab 10/04/18 1919 10/05/18 0326  PROCALCITON 0.21 0.27  LATICACIDVEN 1.4  --     Recent Results (from the past 240 hour(s))  SARS Coronavirus 2 [facility] order, Performed in [facility] hospital lab) Nasopharyngeal Nasopharyngeal Swab     Status: None   Collection Time: 10/04/18 12:40 PM   Specimen: Nasopharyngeal Swab  Result Value Ref Range Status   SARS Coronavirus 2 NEGATIVE NEGATIVE Final    Comment: (NOTE) If result is NEGATIVE SARS-CoV-2 target nucleic acids are NOT DETECTED. The SARS-CoV-2 RNA is generally detectable in upper and lower  respiratory specimens during the acute phase of infection. The lowest  concentration of SARS-CoV-2 viral copies this assay can detect is 250  copies / mL. A negative result does not preclude SARS-CoV-2 infection  and should not be used as the sole basis for treatment or other  patient management decisions.  A negative result may occur with  improper specimen collection /  handling, submission of specimen other  than nasopharyngeal swab, presence of viral mutation(s) within the  areas targeted by this assay, and inadequate number of viral copies  (<250 copies / mL). A negative result must be combined with clinical  observations, patient history, and epidemiological information. If result is POSITIVE SARS-CoV-2 target nucleic acids are DETECTED. The SARS-CoV-2 RNA is generally detectable in upper and lower  respiratory specimens dur ing the acute phase of infection.  Positive  results are indicative of active infection with SARS-CoV-2.  Clinical  correlation with patient history and other diagnostic information is  necessary to determine patient infection status.  Positive results do  not rule out bacterial infection or co-infection with other viruses. If result is PRESUMPTIVE POSTIVE SARS-CoV-2 nucleic acids MAY BE PRESENT.   A presumptive positive result was obtained on the submitted specimen  and confirmed on repeat testing.  While 2019 novel coronavirus  (SARS-CoV-2) nucleic acids may be present in the submitted sample  additional confirmatory testing may be necessary for epidemiological  and / or clinical management purposes  to differentiate between  SARS-CoV-2 and other Sarbecovirus currently known to infect humans.  If clinically indicated additional testing with an alternate test  methodology [phone]) is advised. The SARS-CoV-2 RNA is generally  detectable in upper and lower respiratory sp ecimens during the acute  phase of infection. The expected result is Negative. Fact Sheet for Patients:  StrictlyIdeas.no Fact Sheet for Healthcare Providers: BankingDealers.co.za This test is not yet approved or cleared by the [place] FDA and has been authorized for detection and/or diagnosis of SARS-CoV-2 by FDA under an Emergency Use Authorization (EUA).  This EUA will remain in effect (meaning this  test can be used) for the duration of the COVID-19 declaration under Section 564(b)(1) of the Act, 21 U.S.C. section 360bbb-3(b)(1), unless the authorization is terminated or revoked sooner. Performed at [facility], [place] [address], [place], [place] [address]          Radiology Studies: US Renal  Result Date: 10/05/2018 CLINICAL DATA:  Acute kidney injury. EXAM: RENAL / URINARY TRACT ULTRASOUND COMPLETE COMPARISON:  11/24/16 FINDINGS: Right Kidney: Renal measurements: 8.6 x 4.2 x 4.4 cm = volume: 84 mL . Echogenicity within normal limits. No mass or hydronephrosis visualized. Left Kidney: Renal measurements: 8.5 x 5.5 x 4.4 cm = volume: 107 mL. Simple lower pole cyst measuring 12 mm. No hydronephrosis or solid mass. Bladder: Prominent bladder wall thickness circumferentially  attributed to under distension. A pathologic finding. IMPRESSION: 1. No acute or reversible finding. 2. Symmetric renal size with no cortical thinning. Electronically Signed   By: Monte Fantasia M.D.   On: 10/05/2018 05:42   Dg Chest Port 1 View  Result Date: 10/05/2018 CLINICAL DATA:  77 year old female with a history of difficulty breathing EXAM: PORTABLE CHEST 1 VIEW COMPARISON:  October 04, 2010 FINDINGS: Cardiomediastinal silhouette unchanged in size and contour. Calcifications of the aortic arch. No pneumothorax.  No pleural effusion. Fullness in the central vasculature with interlobular septal thickening, improved from the prior. No new confluent airspace disease. IMPRESSION: Similar appearance of cardiomegaly and mild edema, though improved from the prior. Electronically Signed   By: Corrie Mckusick D.O.   On: 10/05/2018 09:17   Dg Chest Port 1 View  Result Date: 10/04/2018 CLINICAL DATA:  Dyspnea. EXAM: PORTABLE CHEST 1 VIEW COMPARISON:  Radiographs of September 19, 2018. FINDINGS: Stable cardiomegaly. Atherosclerosis of thoracic aorta is noted. No pneumothorax is noted. Mild bilateral perihilar and  basilar opacities are noted concerning for possible edema or possibly multifocal pneumonia. Minimal bilateral pleural effusions are noted. Degenerative changes are noted in the left glenohumeral joint. IMPRESSION: Stable cardiomegaly is noted. Mild bilateral perihilar and basilar opacities are noted concerning for possible edema or possibly multifocal pneumonia. Aortic Atherosclerosis (ICD10-I70.0). Electronically Signed   By: Marijo Conception M.D.   On: 10/04/2018 12:15        Scheduled Meds: . heparin  5,000 Units Subcutaneous Q8H  . hydrocortisone sodium succinate  50 mg Intravenous Q6H  . insulin aspart  0-9 Units Subcutaneous Q4H  . ipratropium-albuterol  3 mL Nebulization QID  . polyvinyl alcohol  1 drop Both Eyes BID  . sodium polystyrene  30 g Rectal Once   Continuous Infusions: . sodium chloride       LOS: 1 day    Time spent: 35 mins.More than 50% of that time was spent in counseling and/or coordination of care.      Shelly Coss, MD [facility] Pager [phone]  If 7PM-7AM, please contact night-coverage [email or web address] Password [email or web address] 10/05/2018, 2:08 PM

## 2018-10-05 NOTE — Progress Notes (Signed)
   Vital Signs MEWS/VS Documentation      10/05/2018 0700 10/05/2018 0748 10/05/2018 0826 10/05/2018 1018   MEWS Score:  2  2  2  2    MEWS Score Color:  Yellow  Yellow  Yellow  Yellow   Pulse:  -  62  63  -   BP:  -  114/61  -  -   Temp:  -  98 F (36.7 C)  -  -   O2 Device:  -  -  Bi-PAP  -   FiO2 (%):  -  -  30 %  -   Level of Consciousness:  -  -  -  Alert     No acute change in patients condition at this time      Rance Muir 10/05/2018,10:28 AM

## 2018-10-05 NOTE — Progress Notes (Signed)
Patient received to the unit via bed.patient is alert and oriented x2. Vital signs are stable. Skin assessment done with another nurse. Patient is on BIPAP and bed side monitor. Iv in place and running kvo. Patient given instructions about call bell and phone. Bed in low position and call bell in reach.

## 2018-10-05 NOTE — Progress Notes (Signed)
   Vital Signs MEWS/VS Documentation      10/05/2018 1200 10/05/2018 1202 10/05/2018 1515 10/05/2018 1617   MEWS Score:  3  2  2  3    MEWS Score Color:  Yellow  Yellow  Yellow  Yellow   Pulse:  -  62  61  -   BP:  -  130/61  -  -   Temp:  -  97.9 F (36.6 C)  -  97.9 F (36.6 C)   O2 Device:  -  -  Bi-PAP  -   FiO2 (%):  -  -  30 %  -   Level of Consciousness:  Responds to Voice  -  -  -      No acute change in patient's condition at this time     Rance Muir 10/05/2018,5:47 PM

## 2018-10-06 LAB — GLUCOSE, CAPILLARY
Glucose-Capillary: 114 mg/dL — ABNORMAL HIGH (ref 70–99)
Glucose-Capillary: 117 mg/dL — ABNORMAL HIGH (ref 70–99)
Glucose-Capillary: 121 mg/dL — ABNORMAL HIGH (ref 70–99)
Glucose-Capillary: 122 mg/dL — ABNORMAL HIGH (ref 70–99)
Glucose-Capillary: 123 mg/dL — ABNORMAL HIGH (ref 70–99)
Glucose-Capillary: 127 mg/dL — ABNORMAL HIGH (ref 70–99)

## 2018-10-06 LAB — BASIC METABOLIC PANEL
Anion gap: 12 (ref 5–15)
BUN: 159 mg/dL — ABNORMAL HIGH (ref 8–23)
CO2: 25 mmol/L (ref 22–32)
Calcium: 8.9 mg/dL (ref 8.9–10.3)
Chloride: 108 mmol/L (ref 98–111)
Creatinine, Ser: 5.56 mg/dL — ABNORMAL HIGH (ref 0.44–1.00)
GFR calc Af Amer: 8 mL/min — ABNORMAL LOW (ref >60)
GFR calc non Af Amer: 7 mL/min — ABNORMAL LOW (ref >60)
Glucose, Bld: 127 mg/dL — ABNORMAL HIGH (ref 70–99)
Potassium: 6.1 mmol/L — ABNORMAL HIGH (ref 3.5–5.1)
Sodium: 145 mmol/L (ref 135–145)

## 2018-10-06 MED ORDER — CALCIUM GLUCONATE-NACL 1-0.675 GM/50ML-% IV SOLN
1.0000 g | Freq: Once | INTRAVENOUS | Status: AC
  Administered 2018-10-06: 12:00:00 1000 mg via INTRAVENOUS
  Filled 2018-10-06: qty 50

## 2018-10-06 MED ORDER — DEXTROSE 5 % IV SOLN
INTRAVENOUS | Status: DC
  Administered 2018-10-06: 14:00:00 via INTRAVENOUS

## 2018-10-06 MED ORDER — SODIUM POLYSTYRENE SULFONATE 15 GM/60ML PO SUSP
45.0000 g | Freq: Once | ORAL | Status: AC
  Administered 2018-10-06: 14:00:00 45 g via RECTAL
  Filled 2018-10-06: qty 180

## 2018-10-06 MED ORDER — GLYCOPYRROLATE 0.2 MG/ML IJ SOLN
0.2000 mg | Freq: Once | INTRAMUSCULAR | Status: AC
  Administered 2018-10-06: 0.2 mg via INTRAVENOUS
  Filled 2018-10-06: qty 1

## 2018-10-06 NOTE — Progress Notes (Signed)
1900 Pt resting in bed with BiPAP in place, and HOB elevated. No facial grimacing showing pain or discomfort. Pt nods head to confirm simple commands. Pt maintains eye contact when being spoken to.Will cont to monitor, call bell within reach.  2200 Call placed to Provider to line up blood sugar checks with insulin administration, new orders written for q 6 hr insulin and blood sugar checks.     0530 Bladder scan done with bladder showing retention of 115 ml of urine. Bandages changed to L and R foot. No acute distress noted, will cont to monitor

## 2018-10-06 NOTE — Progress Notes (Signed)
PROGRESS NOTE    [name] [facility]  HUT:[id number] DOB: 12-Jan-1940 DOA: 10/04/2018 PCP: Nicole Brothers, MD   Brief Narrative:  Patient is a 78 year old female with history of CHF, COPD, diabetes type 2, anemia who presents with complaints of shortness of breath from [facility] health.  She was found to have generalized weakness, decreased level of consciousness at the nursing center.  She was hospitalized here on August and was discharged on 9/11 for CHF exacerbation requiring intubation.  She was found to be hypertensive , hypoxic on arrival.  She was placed on 100% nonrebreather.  ABG revealed pH of 7.19, PCO2 of 76, PO2 of 69.  Chest x-ray showed bilateral perihilar, basilar opacities concerning for edema/multifocal pneumonia.  PCCM, nephrology consulted on admission.  Nephrology did not offer her hemodialysis due to her poor prognosis.  PCCM did not want to intubate because she is not being dialyzed.  Patient made DNR after discussion with family.  Palliative care consulted.  Palliative care discussing goals of care with family but family wants to continue bipap for now.  She is a hospice candidate but family still hopeful.Needs more discussion with family( Niece).   Assessment & Plan:   Principal Problem:   Acute respiratory failure with hypoxia and hypercapnia (HCC) Active Problems:   COPD with asthma (HCC)   Acute on chronic heart failure with preserved ejection fraction (HCC)   Type 2 diabetes mellitus (HCC)   Hypotension   Acute respiratory failure with hypoxia/hypercapnia: Most likely secondary to volume overload.  Chest x-ray showed bilateral opacities concerning for edema/infiltrate.  Zosyn was also started empirically for concern for multifocal pneumonia.  COVID-19-.  Due to acute respiratory failure, there was plan for intubation but after consulting PCCM, patient was not intubated because she will not be dialyzed which would make it impossible to correct her respiratory failure.   Currently on BiPAP.  Patient is DNR.  Acute on chronic congestive heart failure exacerbation: Appeared fluid overloaded on presentation.  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  Elevated BNP.  Last ejection fraction in June 2020 was more than 65%.  Foley catheter was inserted.  She was given Lasix  but she did not respond well. Nephrology signed off.  AKI on CKD stage IV: Baseline creatinine 2.3-2.7.  Follows with [facility].  Patient had previously refused dialysis in case it was needed.  Elevated creatinine with 4.42 on admission with elevated BUN of 140.  Volume overloaded.  She needed dialysis on presentation but due to her comorbidities and poor prognosis, nephrology did not offer dialysis.  Palliative care following  Hyperkalemia: K 6.1 today.  Will order calcium gluconate and rectal Kayexalate  Diabetes type 2: Continue current insulin regimen  Pressure wounds: Continue supportive care.  Frequent turning.  Debility/deconditioning/poor prognosis/multiple comorbidities: Nursing home resident.  Nonambulatory.  Has chronic debility.  Has ulcers on bilateral feet.  She is clearly a hospice candidate.    She is a hospice candidate but family still hopeful and want to continue bipap..Needs more discussion with family( Niece)   Since she has not been offered dialysis, she might succumb due to  respiratory failure due to fluid overload.         DVT prophylaxis:Heparin Code Status: DNR Family Communication: Called niece [name] on phone on 10/05/18.I tried  to answer her questions.  I recommended that comfort care/residential hospice would be the best option for her.She will like to continue Bipap for now. Disposition Plan: Anticipate  hospital death  Consultants: Nephrology, Palliative care  Procedures: None  Antimicrobials:  Anti-infectives (From admission, onward)   Start     Dose/Rate Route Frequency Ordered Stop   10/04/18 1230  piperacillin-tazobactam (ZOSYN) IVPB  3.375 g     3.375 g 100 mL/hr over 30 Minutes Intravenous  Once 10/04/18 1219 10/04/18 1344      Subjective:  Patient seen and examined the bedside this morning.  Hemodynamically stable.  She was not in respiratory distress .She is  on BiPAP.  She remains nonverbal, eyes open and she looked at me.  She tries to speak something but voice not audible.  Objective: Vitals:   10/06/18 0732 10/06/18 0830 10/06/18 1110 10/06/18 1111  BP:  138/60    Pulse:  63  66  Resp:      Temp:  98.1 F (36.7 C)    TempSrc:  Axillary    SpO2: 100% 100% 100% 100%  Weight:      Height:        Intake/Output Summary (Last 24 hours) at 10/06/2018 1128 Last data filed at 10/06/2018 1108 Gross per 24 hour  Intake 0 ml  Output 500 ml  Net -500 ml   Filed Weights   10/04/18 1112 10/04/18 2125 10/06/18 0500  Weight: 90.7 kg 85.9 kg 86.7 kg    Examination:  General exam: Generalized weakness, lethargic, obese HEENT: BiPAP Respiratory system: Bilateral decreased air entry Cardiovascular system: S1 & S2 heard, RRR. No JVD, murmurs, rubs, gallops or clicks.  Anasarca Gastrointestinal system: Abdomen is distended, soft and nontender. No organomegaly or masses felt.  Central nervous system: Alert and awake.  Lethargic  extremities: Anasarca, no clubbing ,no cyanosis, ulcers on bilateral feet    Data Reviewed: I have personally reviewed following labs and imaging studies  CBC: Recent Labs  Lab 10/04/18 1115 10/04/18 1200 10/05/18 0326  WBC 7.8  --  6.9  NEUTROABS  --   --  6.3  HGB 10.1* 10.2* 9.1*  HCT 36.4 30.0* 29.4*  MCV 105.8*  --  95.8  PLT 232  --  914   Basic Metabolic Panel: Recent Labs  Lab 10/04/18 1115 10/04/18 1200 10/04/18 1919 10/05/18 0326 10/06/18 0251  NA 143 143 143 144 145  K 6.5* 6.2* 6.7* 6.7* 6.1*  CL 107  --  107 107 108  CO2 23  --  23 25 25   GLUCOSE 179*  --  134* 136* 127*  BUN 140*  --  143* 148* 159*  CREATININE 4.42*  --  4.70* 4.88* 5.56*  CALCIUM  8.9  --  9.0 9.0 8.9  MG  --   --   --  2.7*  --   PHOS  --   --   --  6.4*  --    GFR: Estimated Creatinine Clearance: 9.2 mL/min (A) (by C-G formula based on SCr of 5.56 mg/dL (H)). Liver Function Tests: Recent Labs  Lab 10/04/18 1115  AST 187*  ALT 140*  ALKPHOS 92  BILITOT 0.5  PROT 7.6  ALBUMIN 3.1*   No results for input(s): LIPASE, AMYLASE in the last 168 hours. No results for input(s): AMMONIA in the last 168 hours. Coagulation Profile: No results for input(s): INR, PROTIME in the last 168 hours. Cardiac Enzymes: No results for input(s): CKTOTAL, CKMB, CKMBINDEX, TROPONINI in the last 168 hours. BNP (last 3 results) No results for input(s): PROBNP in the last 8760 hours. HbA1C: No results for input(s): HGBA1C in the last 72 hours. CBG: Recent Labs  Lab 10/05/18 2200 10/06/18 0029 10/06/18 0118 10/06/18 0617 10/06/18 0831  GLUCAP 122* 121* 127* 122* 114*   Lipid Profile: No results for input(s): CHOL, HDL, LDLCALC, TRIG, CHOLHDL, LDLDIRECT in the last 72 hours. Thyroid Function Tests: No results for input(s): TSH, T4TOTAL, FREET4, T3FREE, THYROIDAB in the last 72 hours. Anemia Panel: No results for input(s): VITAMINB12, FOLATE, FERRITIN, TIBC, IRON, RETICCTPCT in the last 72 hours. Sepsis Labs: Recent Labs  Lab 10/04/18 1919 10/05/18 0326  PROCALCITON 0.21 0.27  LATICACIDVEN 1.4  --     Recent Results (from the past 240 hour(s))  SARS Coronavirus 2 [facility] order, Performed in [facility] hospital lab) Nasopharyngeal Nasopharyngeal Swab     Status: None   Collection Time: 10/04/18 12:40 PM   Specimen: Nasopharyngeal Swab  Result Value Ref Range Status   SARS Coronavirus 2 NEGATIVE NEGATIVE Final    Comment: (NOTE) If result is NEGATIVE SARS-CoV-2 target nucleic acids are NOT DETECTED. The SARS-CoV-2 RNA is generally detectable in upper and lower  respiratory specimens during the acute phase of infection. The lowest  concentration of SARS-CoV-2  viral copies this assay can detect is 250  copies / mL. A negative result does not preclude SARS-CoV-2 infection  and should not be used as the sole basis for treatment or other  patient management decisions.  A negative result may occur with  improper specimen collection / handling, submission of specimen other  than nasopharyngeal swab, presence of viral mutation(s) within the  areas targeted by this assay, and inadequate number of viral copies  (<250 copies / mL). A negative result must be combined with clinical  observations, patient history, and epidemiological information. If result is POSITIVE SARS-CoV-2 target nucleic acids are DETECTED. The SARS-CoV-2 RNA is generally detectable in upper and lower  respiratory specimens dur ing the acute phase of infection.  Positive  results are indicative of active infection with SARS-CoV-2.  Clinical  correlation with patient history and other diagnostic information is  necessary to determine patient infection status.  Positive results do  not rule out bacterial infection or co-infection with other viruses. If result is PRESUMPTIVE POSTIVE SARS-CoV-2 nucleic acids MAY BE PRESENT.   A presumptive positive result was obtained on the submitted specimen  and confirmed on repeat testing.  While 2019 novel coronavirus  (SARS-CoV-2) nucleic acids may be present in the submitted sample  additional confirmatory testing may be necessary for epidemiological  and / or clinical management purposes  to differentiate between  SARS-CoV-2 and other Sarbecovirus currently known to infect humans.  If clinically indicated additional testing with an alternate test  methodology [phone]) is advised. The SARS-CoV-2 RNA is generally  detectable in upper and lower respiratory sp ecimens during the acute  phase of infection. The expected result is Negative. Fact Sheet for Patients:  StrictlyIdeas.no Fact Sheet for Healthcare Providers:  BankingDealers.co.za This test is not yet approved or cleared by the [place] FDA and has been authorized for detection and/or diagnosis of SARS-CoV-2 by FDA under an Emergency Use Authorization (EUA).  This EUA will remain in effect (meaning this test can be used) for the duration of the COVID-19 declaration under Section 564(b)(1) of the Act, 21 U.S.C. section 360bbb-3(b)(1), unless the authorization is terminated or revoked sooner. Performed at [facility], [place] [address], [place], [place] [address]   Blood culture (routine x 2)     Status: None (Preliminary result)   Collection Time: 10/04/18  1:21 PM   Specimen: BLOOD  Result Value Ref Range Status   Specimen Description BLOOD BLOOD RIGHT FOREARM  Final   Special Requests   Final    BOTTLES DRAWN AEROBIC AND ANAEROBIC Blood Culture results may not be optimal due to an inadequate volume of blood received in culture bottles   Culture   Final    NO GROWTH 2 DAYS Performed at [facility], [place] [address], [place], [place] [address]    Report Status PENDING  Incomplete  Blood culture (routine x 2)     Status: None (Preliminary result)   Collection Time: 10/04/18  9:38 PM   Specimen: BLOOD LEFT HAND  Result Value Ref Range Status   Specimen Description BLOOD LEFT HAND  Final   Special Requests   Final    BOTTLES DRAWN AEROBIC ONLY Blood Culture results may not be optimal due to an inadequate volume of blood received in culture bottles   Culture   Final    NO GROWTH 2 DAYS Performed at [facility], [place] [address], [place], [place] [address]    Report Status PENDING  Incomplete         Radiology Studies: US Renal  Result Date: 10/05/2018 CLINICAL DATA:  Acute kidney injury. EXAM: RENAL / URINARY TRACT ULTRASOUND COMPLETE COMPARISON:  11/24/16 FINDINGS: Right Kidney: Renal measurements: 8.6 x 4.2 x 4.4 cm = volume: 84 mL . Echogenicity within normal limits. No mass or  hydronephrosis visualized. Left Kidney: Renal measurements: 8.5 x 5.5 x 4.4 cm = volume: 107 mL. Simple lower pole cyst measuring 12 mm. No hydronephrosis or solid mass. Bladder: Prominent bladder wall thickness circumferentially attributed to under distension. A pathologic finding. IMPRESSION: 1. No acute or reversible finding. 2. Symmetric renal size with no cortical thinning. Electronically Signed   By: Monte Fantasia M.D.   On: 10/05/2018 05:42   Dg Chest Port 1 View  Result Date: 10/05/2018 CLINICAL DATA:  78 year old female with a history of difficulty breathing EXAM: PORTABLE CHEST 1 VIEW COMPARISON:  October 04, 2010 FINDINGS: Cardiomediastinal silhouette unchanged in size and contour. Calcifications of the aortic arch. No pneumothorax.  No pleural effusion. Fullness in the central vasculature with interlobular septal thickening, improved from the prior. No new confluent airspace disease. IMPRESSION: Similar appearance of cardiomegaly and mild edema, though improved from the prior. Electronically Signed   By: Corrie Mckusick D.O.   On: 10/05/2018 09:17   Dg Chest Port 1 View  Result Date: 10/04/2018 CLINICAL DATA:  Dyspnea. EXAM: PORTABLE CHEST 1 VIEW COMPARISON:  Radiographs of September 19, 2018. FINDINGS: Stable cardiomegaly. Atherosclerosis of thoracic aorta is noted. No pneumothorax is noted. Mild bilateral perihilar and basilar opacities are noted concerning for possible edema or possibly multifocal pneumonia. Minimal bilateral pleural effusions are noted. Degenerative changes are noted in the left glenohumeral joint. IMPRESSION: Stable cardiomegaly is noted. Mild bilateral perihilar and basilar opacities are noted concerning for possible edema or possibly multifocal pneumonia. Aortic Atherosclerosis (ICD10-I70.0). Electronically Signed   By: Marijo Conception M.D.   On: 10/04/2018 12:15        Scheduled Meds: . heparin  5,000 Units Subcutaneous Q8H  . insulin aspart  0-9 Units  Subcutaneous Q6H  . ipratropium-albuterol  3 mL Nebulization QID  . polyvinyl alcohol  1 drop Both Eyes BID  . sodium polystyrene  45 g Rectal Once   Continuous Infusions: . sodium chloride    . calcium gluconate       LOS: 2 days  Time spent: 35 mins.More than 50% of that time was spent in counseling and/or coordination of care.      Shelly Coss, MD [facility] Pager [phone]  If 7PM-7AM, please contact night-coverage [email or web address] Password [facility] 10/06/2018, 11:28 AM

## 2018-10-07 DIAGNOSIS — I5033 Acute on chronic diastolic (congestive) heart failure: Secondary | ICD-10-CM

## 2018-10-07 DIAGNOSIS — N179 Acute kidney failure, unspecified: Secondary | ICD-10-CM

## 2018-10-07 DIAGNOSIS — N185 Chronic kidney disease, stage 5: Secondary | ICD-10-CM

## 2018-10-07 DIAGNOSIS — Z515 Encounter for palliative care: Secondary | ICD-10-CM

## 2018-10-07 LAB — BASIC METABOLIC PANEL
Anion gap: 13 (ref 5–15)
BUN: 155 mg/dL — ABNORMAL HIGH (ref 8–23)
CO2: 24 mmol/L (ref 22–32)
Calcium: 8.7 mg/dL — ABNORMAL LOW (ref 8.9–10.3)
Chloride: 109 mmol/L (ref 98–111)
Creatinine, Ser: 5.4 mg/dL — ABNORMAL HIGH (ref 0.44–1.00)
GFR calc Af Amer: 8 mL/min — ABNORMAL LOW (ref >60)
GFR calc non Af Amer: 7 mL/min — ABNORMAL LOW (ref >60)
Glucose, Bld: 128 mg/dL — ABNORMAL HIGH (ref 70–99)
Potassium: 5.8 mmol/L — ABNORMAL HIGH (ref 3.5–5.1)
Sodium: 146 mmol/L — ABNORMAL HIGH (ref 135–145)

## 2018-10-07 LAB — GLUCOSE, CAPILLARY
Glucose-Capillary: 112 mg/dL — ABNORMAL HIGH (ref 70–99)
Glucose-Capillary: 126 mg/dL — ABNORMAL HIGH (ref 70–99)
Glucose-Capillary: 131 mg/dL — ABNORMAL HIGH (ref 70–99)

## 2018-10-07 MED ORDER — FUROSEMIDE 10 MG/ML IJ SOLN
80.0000 mg | Freq: Two times a day (BID) | INTRAMUSCULAR | Status: AC
  Administered 2018-10-07 – 2018-10-08 (×2): 80 mg via INTRAVENOUS
  Filled 2018-10-07 (×2): qty 8

## 2018-10-07 MED ORDER — GLYCOPYRROLATE 1 MG PO TABS
1.0000 mg | ORAL_TABLET | ORAL | Status: DC | PRN
  Filled 2018-10-07: qty 1

## 2018-10-07 MED ORDER — GLYCOPYRROLATE 0.2 MG/ML IJ SOLN: 0.2000 mg | INTRAMUSCULAR | Status: DC | PRN

## 2018-10-07 MED ORDER — CHLORHEXIDINE GLUCONATE 0.12 % MT SOLN
15.0000 mL | Freq: Two times a day (BID) | OROMUCOSAL | Status: DC
  Administered 2018-10-07 – 2018-10-13 (×13): 15 mL via OROMUCOSAL
  Filled 2018-10-07 (×9): qty 15

## 2018-10-07 MED ORDER — HALOPERIDOL LACTATE 5 MG/ML IJ SOLN: 0.5000 mg | INTRAMUSCULAR | Status: DC | PRN

## 2018-10-07 MED ORDER — SODIUM CHLORIDE 0.9% FLUSH: 3.0000 mL | INTRAVENOUS | Status: DC | PRN

## 2018-10-07 MED ORDER — BIOTENE DRY MOUTH MT LIQD: 15.0000 mL | OROMUCOSAL | Status: DC | PRN

## 2018-10-07 MED ORDER — HALOPERIDOL LACTATE 2 MG/ML PO CONC
0.5000 mg | ORAL | Status: DC | PRN
  Filled 2018-10-07: qty 0.3

## 2018-10-07 MED ORDER — ORAL CARE MOUTH RINSE
15.0000 mL | Freq: Two times a day (BID) | OROMUCOSAL | Status: DC
  Administered 2018-10-07 – 2018-10-12 (×9): 15 mL via OROMUCOSAL

## 2018-10-07 MED ORDER — LORAZEPAM 2 MG/ML IJ SOLN
0.5000 mg | INTRAMUSCULAR | Status: DC | PRN
  Administered 2018-10-10 – 2018-10-11 (×3): 0.5 mg via INTRAVENOUS
  Filled 2018-10-07 (×3): qty 1

## 2018-10-07 MED ORDER — SODIUM POLYSTYRENE SULFONATE 15 GM/60ML PO SUSP
30.0000 g | Freq: Once | ORAL | Status: AC
  Administered 2018-10-07: 10:00:00 30 g via RECTAL
  Filled 2018-10-07: qty 120

## 2018-10-07 MED ORDER — LORAZEPAM 1 MG PO TABS: 1.0000 mg | ORAL_TABLET | ORAL | Status: DC | PRN

## 2018-10-07 MED ORDER — LORAZEPAM 2 MG/ML PO CONC: 1.0000 mg | ORAL | Status: DC | PRN

## 2018-10-07 MED ORDER — POLYVINYL ALCOHOL 1.4 % OP SOLN: 1.0000 [drp] | Freq: Four times a day (QID) | OPHTHALMIC | Status: DC | PRN

## 2018-10-07 MED ORDER — HALOPERIDOL 0.5 MG PO TABS
0.5000 mg | ORAL_TABLET | ORAL | Status: DC | PRN
  Filled 2018-10-07: qty 1

## 2018-10-07 MED ORDER — HYDROMORPHONE HCL 1 MG/ML IJ SOLN
0.5000 mg | INTRAMUSCULAR | Status: DC | PRN
  Administered 2018-10-08 – 2018-10-11 (×9): 0.5 mg via INTRAVENOUS
  Filled 2018-10-07 (×10): qty 0.5

## 2018-10-07 MED ORDER — SODIUM CHLORIDE 0.9 % IV SOLN: 250.0000 mL | INTRAVENOUS | Status: DC | PRN

## 2018-10-07 MED ORDER — LORAZEPAM 2 MG/ML IJ SOLN: 1.0000 mg | INTRAMUSCULAR | Status: DC | PRN

## 2018-10-07 NOTE — Progress Notes (Deleted)
Cardiology Office Note   Date:  10/07/2018   ID:  [name], DOB 28-Jun-1940, MRN [id number]  PCP:  Garwin Brothers, MD  Cardiologist:   No chief complaint on file.    History of Present Illness: [name] is a 78 y.o. female who presents for ***    Past Medical History:  Diagnosis Date  . Adenomatous colon polyp   . Anemia   . Arthritis   . Asthma   . COPD (chronic obstructive pulmonary disease) ([place])   . COPD with asthma ([place]) 04/20/2011  . Depression   . Diabetes mellitus   . Diverticulosis   . Gastric ulcer   . GERD (gastroesophageal reflux disease)   . HTN (hypertension)   . Hypercholesterolemia   . Internal hemorrhoids   . Lumbar disc disease   . On home oxygen therapy    "2-3L @ bedtime" (04/04/2017)  . OSA (obstructive sleep apnea) 09/17/2012  . Peptic ulcer disease   . 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 Umbilical hernia   . Valgus foot    right     Past Surgical History:  Procedure Laterality Date  . COLONOSCOPY  2209 or 2010    2001, 2005, 2009  colonoscopy.  [place]  . COLONOSCOPY N/A 08/14/2012   Procedure: COLONOSCOPY;  Surgeon: Ladene Artist, MD;  Location[facility] ENDOSCOPY;  Service: Endoscopy;  Laterality: N/A;  . ESOPHAGOGASTRODUODENOSCOPY (EGD) WITH PROPOFOL N/A 07/25/2018   Procedure: ESOPHAGOGASTRODUODENOSCOPY (EGD) WITH PROPOFOL;  Surgeon: Gatha Mayer, MD;  Location: [facility];  Service: Endoscopy;  Laterality: N/A;  . INCISE AND DRAIN ABCESS     Abdominal Wall  . LIPOMA RESECTION     Right Neck  . TOTAL KNEE ARTHROPLASTY     Bilateral  . UMBILICAL HERNIA REPAIR  2007   incarcerated  . UPPER GASTROINTESTINAL ENDOSCOPY  2001   medoff Grade A esophagitis     No current facility-administered medications for this visit.    No current outpatient medications on file.   Facility-Administered Medications Ordered in Other Visits  Medication Dose Route Frequency Provider Last Rate Last Dose  . 0.9 %  sodium chloride  infusion  250 mL Intravenous PRN Fuller Plan A, MD      . acetaminophen (TYLENOL) tablet 650 mg  650 mg Oral Q4H PRN Smith, Rondell A, MD      . chlorhexidine (PERIDEX) 0.12 % solution 15 mL  15 mL Mouth Rinse BID Shelly Coss, MD   15 mL at 10/07/18 0946  . dextrose 5 % solution   Intravenous Continuous Shelly Coss, MD 50 mL/hr at 10/07/18 0001    . heparin injection 5,000 Units  5,000 Units Subcutaneous Q8H Fuller Plan A, MD   5,000 Units at 10/07/18 0543  . HYDROmorphone (DILAUDID) injection 0.5 mg  0.5 mg Intravenous Q4H PRN Lane Hacker L, DO      . insulin aspart (novoLOG) injection 0-9 Units  0-9 Units Subcutaneous Q6H Bodenheimer, Charles A, NP   1 Units at 10/07/18 0110  . ipratropium-albuterol (DUONEB) 0.5-2.5 (3) MG/3ML nebulizer solution 3 mL  3 mL Nebulization Q4H PRN Smith, Rondell A, MD      . MEDLINE mouth rinse  15 mL Mouth Rinse q12n4p Adhikari, Amrit, MD      . ondansetron (ZOFRAN) injection 4 mg  4 mg Intravenous Q6H PRN Fuller Plan A, MD   4 mg at 10/07/18 0946  . polyvinyl alcohol (LIQUIFILM TEARS) 1.4 % ophthalmic solution 1  drop  1 drop Both Eyes BID Fuller Plan A, MD   1 drop at 10/07/18 0939  . sodium chloride flush (NS) 0.9 % injection 3 mL  3 mL Intravenous PRN Norval Morton, MD        Allergies:   Other    Social History:  The patient  reports that she quit smoking about 60 years ago. Her smoking use included cigarettes. She has a 10.00 pack-year smoking history. She has never used smokeless tobacco. She reports that she does not drink alcohol or use drugs.   Family History:  The patient's family history includes Breast cancer in her maternal aunt.    ROS: All other systems are reviewed and negative. Unless otherwise mentioned in H&P    PHYSICAL EXAM: VS:  There were no vitals taken for this visit. , BMI There is no height or weight on file to calculate BMI. GEN: Well nourished, well developed, in no acute distress HEENT: normal  Neck: no JVD, carotid bruits, or masses Cardiac: ***RRR; no murmurs, rubs, or gallops,no edema  Respiratory:  Clear to auscultation bilaterally, normal work of breathing GI: soft, nontender, nondistended, + BS MS: no deformity or atrophy Skin: warm and dry, no rash Neuro:  Strength and sensation are intact Psych: euthymic mood, full affect   EKG:  EKG {ACTION; IS/IS [id number]} ordered today. The ekg ordered today demonstrates ***   Recent Labs: 10/04/2018: ALT 140; B Natriuretic Peptide 332.6 10/05/2018: Hemoglobin 9.1; Magnesium 2.7; Platelets 167 10/07/2018: BUN 155; Creatinine, Ser 5.40; Potassium 5.8; Sodium 146    Lipid Panel    Component Value Date/Time   CHOL 111 09/06/2018 0359   TRIG 91 09/06/2018 0359   HDL 36 (L) 09/06/2018 0359   CHOLHDL 3.1 09/06/2018 0359   VLDL 18 09/06/2018 0359   LDLCALC 57 09/06/2018 0359      Wt Readings from Last 3 Encounters:  10/07/18 191 lb 9.3 oz (86.9 kg)  09/21/18 176 lb 5.9 oz (80 kg)  08/02/18 180 lb (81.6 kg)      Other studies Reviewed: Additional studies/ records that were reviewed today include: ***. Review of the above records demonstrates: ***   ASSESSMENT AND PLAN:  1.  ***   Current medicines are reviewed at length with the patient today.    Labs/ tests ordered today include: *** Phill Myron. [place], ANP, [facility]   10/07/2018 11:47 AM    [place] [address] Office [phone] Fax [phone]

## 2018-10-07 NOTE — Progress Notes (Signed)
SLP Cancellation Note  Patient Details Name: [name] MRN: [id number] DOB: [birth year]   Cancelled treatment:        Attempted to see pt for swallowing evaluation. Pt is currently on liquid diet.  Spoke with RN.  Goals of care discussion is ongoing with possible comfort feeds pending.  Evaluation deferred at this time. SLP will continue to monitor and will remain available.   Celedonio Savage, [place], [place] Office: [phone] 10/07/2018, 2:37 PM

## 2018-10-07 NOTE — Progress Notes (Signed)
Chaplain responded to request for prayer.  The chaplain prayed with the patient and the patient expressed gratitude for the prayer.  The on-call Chaplain will refer unit chaplain for any additional care.  Brion Aliment Chaplain Resident For questions concerning this note please contact me by pager [phone]

## 2018-10-07 NOTE — Progress Notes (Signed)
Daily Progress Note   Patient Name: [name] [facility]       Date: 10/07/2018 DOB: 1940/01/31  Age: 78 y.o. MRN#: [id number] Attending Physician: Shelly Coss, MD Primary Care Physician: Garwin Brothers, MD Admit Date: 10/04/2018  Reason for Consultation/Follow-up: Establishing goals of care, Non pain symptom management, Pain control and Psychosocial/spiritual support  Subjective: Called by RN Jasmin to see patient and family.  Patient had voiced to [place] which funeral home she wants her body to go to after death.  Family meeting requested for today.  Family meeting held with Patient, sister and nieces.  Patient has no children.  Patient has explained to family that she is dying.  She is clear of mind. Family attending to her needs.  We had a long discussion about her care and Hospice services.  Family also understands patient is declining rapidly and may not make it to [facility].    Assessment: Creatinine climbing.  Patient requiring BiPAP due to end stage lung disease and advanced heart failure.     Patient Profile/HPI: 78 yo female with PMT of DHF, COPD, OSA on home oxygen, CKD 4, DM, PUD, who has had 5 admits in the past 6 months presented to the ER on 9/24 with hypoxic and hypercapnic respiratory failure.  This was felt to be due to multifocal pneumonia and heart failure.  She also showed evidence of acute on chronic kidney disease.  She was not a candidate for dialysis or intubation.  Her CKD has worsened during this hospitalization due to diuresis that is necessary for her to breathe.   Length of Stay: 3  Current Medications: Scheduled Meds:  . chlorhexidine  15 mL Mouth Rinse BID  . furosemide  80 mg Intravenous Q12H  . heparin  5,000 Units Subcutaneous Q8H  . insulin aspart  0-9  Units Subcutaneous Q6H  . mouth rinse  15 mL Mouth Rinse q12n4p  . polyvinyl alcohol  1 drop Both Eyes BID    Continuous Infusions: . sodium chloride    . dextrose 50 mL/hr at 10/07/18 0001    PRN Meds: sodium chloride, acetaminophen, HYDROmorphone (DILAUDID) injection, ipratropium-albuterol, ondansetron (ZOFRAN) IV, sodium chloride flush  Physical Exam        Pleasant elderly female, appears chronically ill CV rrr  resp decreased breath sounds Abdomen soft, nt,  nd Extremities very swollen.  Vital Signs: BP (!) 151/67 (BP Location: Left Arm)   Pulse 64   Temp 97.7 F (36.5 C) (Oral)   Resp (!) 21   Ht 5\' 4"  (1.626 m)   Wt 86.9 kg   SpO2 100%   BMI 32.88 kg/m  SpO2: SpO2: 100 % O2 Device: O2 Device: Nasal Cannula O2 Flow Rate: O2 Flow Rate (L/min): 5 L/min  Intake/output summary:   Intake/Output Summary (Last 24 hours) at 10/07/2018 1426 Last data filed at 10/07/2018 1157 Gross per 24 hour  Intake 0 ml  Output 101 ml  Net -101 ml   LBM: Last BM Date: 10/06/18 Baseline Weight: Weight: 90.7 kg Most recent weight: Weight: 86.9 kg       Palliative Assessment/Data:  20%      Patient Active Problem List   Diagnosis Date Noted  . Hypotension 10/04/2018  . Goals of care, counseling/discussion   . Palliative care encounter   . Acute respiratory failure with hypoxemia ([place])   . Acute encephalopathy   . Acute respiratory failure with hypoxia and hypercapnia ([place]) 09/11/2018  . Shock circulatory ([place])   . Elevated brain natriuretic peptide (BNP) level 09/03/2018  . Troponin I above reference range 09/03/2018  . Acute on chronic combined systolic (congestive) and diastolic (congestive) heart failure ([place]) 09/03/2018  . Hyperkalemia 09/03/2018  . Acute renal failure superimposed on chronic kidney disease ([place])   . Pressure injury of skin 07/24/2018  . Aortic atherosclerosis ([place]) 07/24/2018  . Occult blood positive stool   . Anemia due to GI blood loss 07/23/2018   . Essential hypertension 07/05/2018  . CHF (congestive heart failure), NYHA class IV, chronic, combined ([place]) 07/05/2018  . Diabetic hyperosmolar non-ketotic state ([place]) 06/17/2018  . Hyperglycemia 06/16/2018  . Type 2 diabetes mellitus ([place]) 06/16/2018  . UTI (urinary tract infection) 06/16/2018  . Sacral decubitus ulcer 06/16/2018  . Hypertensive urgency 04/04/2017  . CKD (chronic kidney disease) stage 4, GFR 15-29 ml/min (HCC) 04/04/2017  . Hypoglycemia   . Metabolic acidosis with normal anion gap and bicarbonate losses 11/23/2016  . Acute on chronic heart failure with preserved ejection fraction ([place]) 11/22/2016  . Midfoot ulcer, left, limited to breakdown of skin ([place]) 06/13/2016  . Idiopathic chronic venous hypertension of both lower extremities with inflammation 03/10/2016  . Onychomycosis 03/10/2016  . Chronic obstructive pulmonary disease ([place]) 11/07/2013  . Upper airway cough syndrome 11/07/2013  . Insomnia 12/10/2012  . OSA (obstructive sleep apnea) 09/17/2012  . Benign neoplasm of colon 08/14/2012  . Rectal bleeding 08/13/2012    Class: Acute  . Anemia of chronic disease 08/13/2012    Class: Acute  . Osteoarthritis 08/13/2012    Class: Chronic  . Acute posthemorrhagic anemia 08/13/2012  . Diverticulosis of colon (without mention of hemorrhage) 08/13/2012  . Hoarseness 07/20/2011  . COPD with asthma ([place]) 04/20/2011  . Allergic rhinitis 04/20/2011  . GI bleed 02/08/2011  . Personal history of colonic polyps 04/16/2010  . Fecal incontinence 03/23/2010  . Change in bowel habits 03/23/2010    Palliative Care Plan    Recommendations/Plan:  Shift to full comfort  Unrestricted visitation status  Comfort meds added PRN  Requested chaplain visit  Case management order placed to discuss [place] (BP) with Kristine Garbe at bedside.  Goals of Care and Additional Recommendations:  Limitations on Scope of Treatment: Full Comfort Care  Code Status:  DNR   Prognosis:   Hours - Days.  Patient no longer wants BiPAP.  Will become hypercapnic soon.  Prognosis is short.   Discharge Planning:  Anticipated Hospital Death vs Hospice House.  Care plan was discussed with family, Patient, RN  Thank you for allowing the Palliative Medicine Team to assist in the care of this patient.  Total time spent:  90 min. Time in 2:30 Time out 4:00     Greater than 50%  of this time was spent counseling and coordinating care related to the above assessment and plan.  Florentina Jenny, PA-C Palliative Medicine  Please contact Palliative MedicineTeam phone at [phone] for questions and concerns between 7 am - 7 pm.   Please see AMION for individual provider pager numbers.

## 2018-10-07 NOTE — Progress Notes (Addendum)
Updated niece via phone regarding discontinuation of her bipap and speech to consult for possibility of eating and patient spoke with niece briefly. Patient voiced the funeral home she wants her body released to and told niece she may not make it much longer.

## 2018-10-07 NOTE — Progress Notes (Signed)
PROGRESS NOTE    Edward Trevino [facility]  IWL:[id number] DOB: May 18, 1940 DOA: 10/04/2018 PCP: Garwin Brothers, MD   Brief Narrative:  Patient is a 78 year old female with history of CHF, COPD, diabetes type 2, anemia who presents with complaints of shortness of breath from [facility] health.  She was found to have generalized weakness, decreased level of consciousness at the nursing center.  She was hospitalized here on August and was discharged on 9/11 for CHF exacerbation requiring intubation.  She was found to be hypertensive , hypoxic on arrival.  She was placed on 100% nonrebreather.  ABG revealed pH of 7.19, PCO2 of 76, PO2 of 69.  Chest x-ray showed bilateral perihilar, basilar opacities concerning for edema/multifocal pneumonia.  PCCM, nephrology consulted on admission.  Nephrology did not offer her hemodialysis due to her poor prognosis.  PCCM did not want to intubate because she is not being dialyzed.  Patient made DNR after discussion with family.  Palliative care consulted.  Palliative care discussing goals of care with family but family wanted to continue bipap   She is a hospice candidate but family still hopeful.Needs more discussion with family( Niece).   Assessment & Plan:   Principal Problem:   Acute respiratory failure with hypoxia and hypercapnia (HCC) Active Problems:   COPD with asthma (HCC)   Acute on chronic heart failure with preserved ejection fraction (HCC)   Type 2 diabetes mellitus (HCC)   Hypotension   Acute respiratory failure with hypoxia/hypercapnia: Most likely secondary to volume overload.  Chest x-ray showed bilateral opacities concerning for edema/infiltrate.  Zosyn was also started empirically for concern for multifocal pneumonia.  COVID-19-.  Due to acute respiratory failure, there was plan for intubation but after consulting PCCM, patient was not intubated because she will not be dialyzed which would make it impossible to correct her respiratory failure.  Currently on  BiPAP.  Patient is DNR.  Acute on chronic congestive heart failure exacerbation: Appeared fluid overloaded on presentation.  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  Elevated BNP.  Last ejection fraction in June 2020 was more than 65%.  Foley catheter was inserted.  She was given Lasix  but she did not respond well. Nephrology signed off. I will order few doses of lasix again today.She put out 600 ml of urine yesterday.  AKI on CKD stage IV: Baseline creatinine 2.3-2.7.  Follows with [facility].  Patient had previously refused dialysis in case it was needed.  Elevated creatinine with 4.42 on admission with elevated BUN of 140.  Volume overloaded.  She needed dialysis on presentation but due to her comorbidities and poor prognosis, nephrology did not offer dialysis.  Palliative care following  Hyperkalemia: K 5.8 today.  Will  rectal Kayexalate  Diabetes type 2: Continue current insulin regimen  Pressure wounds: Continue supportive care.  Frequent turning.  Debility/deconditioning/poor prognosis/multiple comorbidities: Nursing home resident.  Nonambulatory.  Has chronic debility.  Has ulcers on bilateral feet.  She is clearly a hospice candidate.    She is a hospice candidate but family still hopeful and want to continue bipap.Needs more discussion with family( Niece)   Since she has not been offered dialysis, she might succumb due to  respiratory failure due to fluid overload. I have decided to give her a break from BiPAP today.  I have requested respiratory therapist, speech therapist to see her today.          DVT prophylaxis:Heparin Code Status: DNR Family Communication: Discussed with niece at the bed  side Disposition Plan: Undetermined at this point   Consultants: Nephrology, Palliative care  Procedures: None  Antimicrobials:  Anti-infectives (From admission, onward)   Start     Dose/Rate Route Frequency Ordered Stop   10/04/18 1230  piperacillin-tazobactam  (ZOSYN) IVPB 3.375 g     3.375 g 100 mL/hr over 30 Minutes Intravenous  Once 10/04/18 1219 10/04/18 1344      Subjective:  Patient seen and examined the bedside this morning.  She was still the same.  She did not appear to be in respiratory distress.  She was on BiPAP.  She opens her eyes and speaks when called her name and asked question.  BiPAP was discontinued this afternoon.  She has been started on clear liquid diet.  Looks volume overloaded so I have ordered few doses of Lasix.   Objective: Vitals:   10/07/18 0404 10/07/18 0506 10/07/18 0800 10/07/18 0830  BP: (!) 144/71  (!) 144/67 (!) 144/67  Pulse: (!) 58  65 64  Resp: 19  17 17   Temp: 97.6 F (36.4 C)  (!) 97.3 F (36.3 C)   TempSrc: Axillary  Axillary   SpO2: 98%  100% 100%  Weight:  86.9 kg    Height:        Intake/Output Summary (Last 24 hours) at 10/07/2018 1311 Last data filed at 10/07/2018 1157 Gross per 24 hour  Intake 0 ml  Output 301 ml  Net -301 ml   Filed Weights   10/04/18 2125 10/06/18 0500 10/07/18 0506  Weight: 85.9 kg 86.7 kg 86.9 kg    Examination:  General exam: Generalized weakness, lethargic, obese HEENT: BiPAP Respiratory system: Bilateral decreased air entry Cardiovascular system: S1 & S2 heard, RRR. No JVD, murmurs, rubs, gallops or clicks.  Anasarca Gastrointestinal system: Abdomen is distended, soft and nontender. No organomegaly or masses felt.  Central nervous system: Alert and awake.  Lethargic  extremities: Anasarca, no clubbing ,no cyanosis, ulcers on bilateral feet    Data Reviewed: I have personally reviewed following labs and imaging studies  CBC: Recent Labs  Lab 10/04/18 1115 10/04/18 1200 10/05/18 0326  WBC 7.8  --  6.9  NEUTROABS  --   --  6.3  HGB 10.1* 10.2* 9.1*  HCT 36.4 30.0* 29.4*  MCV 105.8*  --  95.8  PLT 232  --  326   Basic Metabolic Panel: Recent Labs  Lab 10/04/18 1115 10/04/18 1200 10/04/18 1919 10/05/18 0326 10/06/18 0251 10/07/18 0306   NA 143 143 143 144 145 146*  K 6.5* 6.2* 6.7* 6.7* 6.1* 5.8*  CL 107  --  107 107 108 109  CO2 23  --  23 25 25 24   GLUCOSE 179*  --  134* 136* 127* 128*  BUN 140*  --  143* 148* 159* 155*  CREATININE 4.42*  --  4.70* 4.88* 5.56* 5.40*  CALCIUM 8.9  --  9.0 9.0 8.9 8.7*  MG  --   --   --  2.7*  --   --   PHOS  --   --   --  6.4*  --   --    GFR: Estimated Creatinine Clearance: 9.5 mL/min (A) (by C-G formula based on SCr of 5.4 mg/dL (H)). Liver Function Tests: Recent Labs  Lab 10/04/18 1115  AST 187*  ALT 140*  ALKPHOS 92  BILITOT 0.5  PROT 7.6  ALBUMIN 3.1*   No results for input(s): LIPASE, AMYLASE in the last 168 hours. No results for input(s): AMMONIA  in the last 168 hours. Coagulation Profile: No results for input(s): INR, PROTIME in the last 168 hours. Cardiac Enzymes: No results for input(s): CKTOTAL, CKMB, CKMBINDEX, TROPONINI in the last 168 hours. BNP (last 3 results) No results for input(s): PROBNP in the last 8760 hours. HbA1C: No results for input(s): HGBA1C in the last 72 hours. CBG: Recent Labs  Lab 10/06/18 1305 10/06/18 1655 10/07/18 0012 10/07/18 0624 10/07/18 1220  GLUCAP 117* 123* 126* 112* 131*   Lipid Profile: No results for input(s): CHOL, HDL, LDLCALC, TRIG, CHOLHDL, LDLDIRECT in the last 72 hours. Thyroid Function Tests: No results for input(s): TSH, T4TOTAL, FREET4, T3FREE, THYROIDAB in the last 72 hours. Anemia Panel: No results for input(s): VITAMINB12, FOLATE, FERRITIN, TIBC, IRON, RETICCTPCT in the last 72 hours. Sepsis Labs: Recent Labs  Lab 10/04/18 1919 10/05/18 0326  PROCALCITON 0.21 0.27  LATICACIDVEN 1.4  --     Recent Results (from the past 240 hour(s))  SARS Coronavirus 2 [facility] order, Performed in [facility] hospital lab) Nasopharyngeal Nasopharyngeal Swab     Status: None   Collection Time: 10/04/18 12:40 PM   Specimen: Nasopharyngeal Swab  Result Value Ref Range Status   SARS Coronavirus 2 NEGATIVE NEGATIVE  Final    Comment: (NOTE) If result is NEGATIVE SARS-CoV-2 target nucleic acids are NOT DETECTED. The SARS-CoV-2 RNA is generally detectable in upper and lower  respiratory specimens during the acute phase of infection. The lowest  concentration of SARS-CoV-2 viral copies this assay can detect is 250  copies / mL. A negative result does not preclude SARS-CoV-2 infection  and should not be used as the sole basis for treatment or other  patient management decisions.  A negative result may occur with  improper specimen collection / handling, submission of specimen other  than nasopharyngeal swab, presence of viral mutation(s) within the  areas targeted by this assay, and inadequate number of viral copies  (<250 copies / mL). A negative result must be combined with clinical  observations, patient history, and epidemiological information. If result is POSITIVE SARS-CoV-2 target nucleic acids are DETECTED. The SARS-CoV-2 RNA is generally detectable in upper and lower  respiratory specimens dur ing the acute phase of infection.  Positive  results are indicative of active infection with SARS-CoV-2.  Clinical  correlation with patient history and other diagnostic information is  necessary to determine patient infection status.  Positive results do  not rule out bacterial infection or co-infection with other viruses. If result is PRESUMPTIVE POSTIVE SARS-CoV-2 nucleic acids MAY BE PRESENT.   A presumptive positive result was obtained on the submitted specimen  and confirmed on repeat testing.  While 2019 novel coronavirus  (SARS-CoV-2) nucleic acids may be present in the submitted sample  additional confirmatory testing may be necessary for epidemiological  and / or clinical management purposes  to differentiate between  SARS-CoV-2 and other Sarbecovirus currently known to infect humans.  If clinically indicated additional testing with an alternate test  methodology [phone]) is advised. The  SARS-CoV-2 RNA is generally  detectable in upper and lower respiratory sp ecimens during the acute  phase of infection. The expected result is Negative. Fact Sheet for Patients:  StrictlyIdeas.no Fact Sheet for Healthcare Providers: BankingDealers.co.za This test is not yet approved or cleared by the [place] FDA and has been authorized for detection and/or diagnosis of SARS-CoV-2 by FDA under an Emergency Use Authorization (EUA).  This EUA will remain in effect (meaning this test can be used) for the duration  of the COVID-19 declaration under Section 564(b)(1) of the Act, 21 U.S.C. section 360bbb-3(b)(1), unless the authorization is terminated or revoked sooner. Performed at [facility], [place] [address] [place], [place] [address]   Blood culture (routine x 2)     Status: None (Preliminary result)   Collection Time: 10/04/18  1:21 PM   Specimen: BLOOD  Result Value Ref Range Status   Specimen Description BLOOD BLOOD RIGHT FOREARM  Final   Special Requests   Final    BOTTLES DRAWN AEROBIC AND ANAEROBIC Blood Culture results may not be optimal due to an inadequate volume of blood received in culture bottles   Culture   Final    NO GROWTH 3 DAYS Performed at [facility], [place] [address] [place], [place] [address]    Report Status PENDING  Incomplete  Blood culture (routine x 2)     Status: None (Preliminary result)   Collection Time: 10/04/18  9:38 PM   Specimen: BLOOD LEFT HAND  Result Value Ref Range Status   Specimen Description BLOOD LEFT HAND  Final   Special Requests   Final    BOTTLES DRAWN AEROBIC ONLY Blood Culture results may not be optimal due to an inadequate volume of blood received in culture bottles   Culture   Final    NO GROWTH 3 DAYS Performed at [facility], [place] [address] [place], [place] [address]    Report Status PENDING  Incomplete         Radiology Studies: No results found.       Scheduled Meds: . chlorhexidine  15 mL Mouth Rinse BID  . heparin  5,000 Units Subcutaneous Q8H  . insulin aspart  0-9 Units Subcutaneous Q6H  . mouth rinse  15 mL Mouth Rinse q12n4p  . polyvinyl alcohol  1 drop Both Eyes BID   Continuous Infusions: . sodium chloride    . dextrose 50 mL/hr at 10/07/18 0001     LOS: 3 days    Time spent: 35 mins.More than 50% of that time was spent in counseling and/or coordination of care.      Shelly Coss, MD [facility] Pager [phone]  If 7PM-7AM, please contact night-coverage [email or web address] Password [email or web address] 10/07/2018, 1:11 PM

## 2018-10-07 NOTE — Progress Notes (Signed)
Patient family met with palliative care Decided to make patient full comfort measures Removed monitors and equipment  Patient alert/oriented and family at bedside. Tolerating clear liquids.

## 2018-10-08 ENCOUNTER — Ambulatory Visit: Payer: Medicare Other | Admitting: Adult Health

## 2018-10-08 DIAGNOSIS — N170 Acute kidney failure with tubular necrosis: Secondary | ICD-10-CM

## 2018-10-08 DIAGNOSIS — N184 Chronic kidney disease, stage 4 (severe): Secondary | ICD-10-CM

## 2018-10-08 MED ORDER — SODIUM CHLORIDE 0.9% FLUSH
3.0000 mL | Freq: Two times a day (BID) | INTRAVENOUS | Status: DC
  Administered 2018-10-08 – 2018-10-13 (×10): 3 mL via INTRAVENOUS

## 2018-10-08 MED ORDER — SODIUM CHLORIDE 0.9% FLUSH
3.0000 mL | Freq: Two times a day (BID) | INTRAVENOUS | Status: DC
  Administered 2018-10-08 – 2018-10-09 (×3): 3 mL via INTRAVENOUS

## 2018-10-08 NOTE — Progress Notes (Signed)
PROGRESS NOTE    [name] [facility]  BEE:[id number] DOB: 1940-06-18 DOA: 10/04/2018 PCP: Garwin Brothers, MD   Brief Narrative:  Patient is a 78 year old female with history of CHF, COPD, diabetes type 2, anemia who presents with complaints of shortness of breath from [facility].  She was found to have generalized weakness, decreased level of consciousness at the nursing center.  She was hospitalized here on August and was discharged on 9/11 for CHF exacerbation requiring intubation.  She was found to be hypertensive , hypoxic on arrival.  She was placed on 100% nonrebreather.  ABG revealed pH of 7.19, PCO2 of 76, PO2 of 69.  Chest x-ray showed bilateral perihilar, basilar opacities concerning for edema/multifocal pneumonia.  PCCM, nephrology consulted on admission.  Nephrology did not offer her hemodialysis due to her poor prognosis.  PCCM did not want to intubate because she is not being dialyzed.  Patient made DNR after discussion with family.  Palliative care consulted.  Started on comfort care.  Plan is to transfer  her to residential hospice as soon as possible.  Assessment & Plan:   Principal Problem:   Acute respiratory failure with hypoxia and hypercapnia (HCC) Active Problems:   COPD with asthma (HCC)   Acute on chronic heart failure with preserved ejection fraction (HCC)   Type 2 diabetes mellitus (HCC)   Hypotension   Acute on chronic diastolic heart failure (HCC)   Comfort measures only status   Acute respiratory failure with hypoxia/hypercapnia: Most likely secondary to volume overload.  Chest x-ray showed bilateral opacities concerning for edema/infiltrate.  Zosyn was also started empirically for concern for multifocal pneumonia.  COVID-19-.  Due to acute respiratory failure, there was plan for intubation but after consulting PCCM, patient was not intubated because she will not be dialyzed which would make it impossible to correct her respiratory failure.  Off BiPAP.  Patient is  DNR.  Acute on chronic congestive heart failure exacerbation: Appeared fluid overloaded on presentation.  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  Elevated BNP.  Last ejection fraction in June 2020 was more than 65%.  Foley catheter was inserted.  She was given Lasix  but she did not respond well. Nephrology signed off.  AKI on CKD stage IV: Baseline creatinine 2.3-2.7.  Follows with [facility].  Patient had previously refused dialysis in case it was needed.  Elevated creatinine with 4.42 on admission with elevated BUN of 140.  Volume overloaded.  She needed dialysis on presentation but due to her comorbidities and poor prognosis, nephrology did not offer dialysis.  Palliative care following  Hyperkalemia: Persistently elevated K due to ESRD.  Diabetes type 2: Continue current insulin regimen  Pressure wounds: Continue supportive care.  Frequent turning.  Debility/deconditioning/poor prognosis/multiple comorbidities: Nursing home resident.  Nonambulatory.  Has chronic debility.  Has ulcers on bilateral feet.   Since she has not been offered dialysis, she might succumb due to  respiratory failure due to fluid overload. Waiting for residential hospice          DVT prophylaxis:Heparin Code Status: DNR Family Communication: Discussed with niece at the bed side on 10/07/18 Disposition Plan: Residential hospice vs hospital death   Consultants: Nephrology, Palliative care  Procedures: None  Antimicrobials:  Anti-infectives (From admission, onward)   Start     Dose/Rate Route Frequency Ordered Stop   10/04/18 1230  piperacillin-tazobactam (ZOSYN) IVPB 3.375 g     3.375 g 100 mL/hr over 30 Minutes Intravenous  Once 10/04/18 1219 10/04/18  1344      Subjective:  Patient seen and examined the bedside this morning.  Off BiPAP.  She was not in acute respiratory distress but complains of shortness of breath.  Looks volume overloaded.  No new issues since yesterday.  I have  requested social worker for transferring her to residential hospice.   Objective: Vitals:   10/08/18 0500 10/08/18 0730 10/08/18 0735 10/08/18 1000  BP: 130/68     Pulse: 66     Resp: 18     Temp: 97.9 F (36.6 C)     TempSrc: Oral     SpO2: 93% (!) 89% 93% 92%  Weight:      Height:        Intake/Output Summary (Last 24 hours) at 10/08/2018 1317 Last data filed at 10/08/2018 1300 Gross per 24 hour  Intake 352 ml  Output 651 ml  Net -299 ml   Filed Weights   10/04/18 2125 10/06/18 0500 10/07/18 0506  Weight: 85.9 kg 86.7 kg 86.9 kg    Examination:  General exam: Generalized weakness, lethargic, obese,terminally ill HEENT: BiPAP Respiratory system: Bilateral decreased air entry Cardiovascular system: S1 & S2 heard, RRR. No JVD, murmurs, rubs, gallops or clicks.  Anasarca Gastrointestinal system: Abdomen is distended, soft and nontender. No organomegaly or masses felt.  Central nervous system: Alert and awake.  Lethargic  extremities: Anasarca, no clubbing ,no cyanosis, ulcers on bilateral feet    Data Reviewed: I have personally reviewed following labs and imaging studies  CBC: Recent Labs  Lab 10/04/18 1115 10/04/18 1200 10/05/18 0326  WBC 7.8  --  6.9  NEUTROABS  --   --  6.3  HGB 10.1* 10.2* 9.1*  HCT 36.4 30.0* 29.4*  MCV 105.8*  --  95.8  PLT 232  --  676   Basic Metabolic Panel: Recent Labs  Lab 10/04/18 1115 10/04/18 1200 10/04/18 1919 10/05/18 0326 10/06/18 0251 10/07/18 0306  NA 143 143 143 144 145 146*  K 6.5* 6.2* 6.7* 6.7* 6.1* 5.8*  CL 107  --  107 107 108 109  CO2 23  --  23 25 25 24   GLUCOSE 179*  --  134* 136* 127* 128*  BUN 140*  --  143* 148* 159* 155*  CREATININE 4.42*  --  4.70* 4.88* 5.56* 5.40*  CALCIUM 8.9  --  9.0 9.0 8.9 8.7*  MG  --   --   --  2.7*  --   --   PHOS  --   --   --  6.4*  --   --    GFR: Estimated Creatinine Clearance: 9.5 mL/min (A) (by C-G formula based on SCr of 5.4 mg/dL (H)). Liver Function Tests:  Recent Labs  Lab 10/04/18 1115  AST 187*  ALT 140*  ALKPHOS 92  BILITOT 0.5  PROT 7.6  ALBUMIN 3.1*   No results for input(s): LIPASE, AMYLASE in the last 168 hours. No results for input(s): AMMONIA in the last 168 hours. Coagulation Profile: No results for input(s): INR, PROTIME in the last 168 hours. Cardiac Enzymes: No results for input(s): CKTOTAL, CKMB, CKMBINDEX, TROPONINI in the last 168 hours. BNP (last 3 results) No results for input(s): PROBNP in the last 8760 hours. HbA1C: No results for input(s): HGBA1C in the last 72 hours. CBG: Recent Labs  Lab 10/06/18 1305 10/06/18 1655 10/07/18 0012 10/07/18 0624 10/07/18 1220  GLUCAP 117* 123* 126* 112* 131*   Lipid Profile: No results for input(s): CHOL, HDL, LDLCALC, TRIG,  CHOLHDL, LDLDIRECT in the last 72 hours. Thyroid Function Tests: No results for input(s): TSH, T4TOTAL, FREET4, T3FREE, THYROIDAB in the last 72 hours. Anemia Panel: No results for input(s): VITAMINB12, FOLATE, FERRITIN, TIBC, IRON, RETICCTPCT in the last 72 hours. Sepsis Labs: Recent Labs  Lab 10/04/18 1919 10/05/18 0326  PROCALCITON 0.21 0.27  LATICACIDVEN 1.4  --     Recent Results (from the past 240 hour(s))  SARS Coronavirus 2 [facility] order, Performed in [facility] hospital lab) Nasopharyngeal Nasopharyngeal Swab     Status: None   Collection Time: 10/04/18 12:40 PM   Specimen: Nasopharyngeal Swab  Result Value Ref Range Status   SARS Coronavirus 2 NEGATIVE NEGATIVE Final    Comment: (NOTE) If result is NEGATIVE SARS-CoV-2 target nucleic acids are NOT DETECTED. The SARS-CoV-2 RNA is generally detectable in upper and lower  respiratory specimens during the acute phase of infection. The lowest  concentration of SARS-CoV-2 viral copies this assay can detect is 250  copies / mL. A negative result does not preclude SARS-CoV-2 infection  and should not be used as the sole basis for treatment or other  patient management decisions.  A  negative result may occur with  improper specimen collection / handling, submission of specimen other  than nasopharyngeal swab, presence of viral mutation(s) within the  areas targeted by this assay, and inadequate number of viral copies  (<250 copies / mL). A negative result must be combined with clinical  observations, patient history, and epidemiological information. If result is POSITIVE SARS-CoV-2 target nucleic acids are DETECTED. The SARS-CoV-2 RNA is generally detectable in upper and lower  respiratory specimens dur ing the acute phase of infection.  Positive  results are indicative of active infection with SARS-CoV-2.  Clinical  correlation with patient history and other diagnostic information is  necessary to determine patient infection status.  Positive results do  not rule out bacterial infection or co-infection with other viruses. If result is PRESUMPTIVE POSTIVE SARS-CoV-2 nucleic acids MAY BE PRESENT.   A presumptive positive result was obtained on the submitted specimen  and confirmed on repeat testing.  While 2019 novel coronavirus  (SARS-CoV-2) nucleic acids may be present in the submitted sample  additional confirmatory testing may be necessary for epidemiological  and / or clinical management purposes  to differentiate between  SARS-CoV-2 and other Sarbecovirus currently known to infect humans.  If clinically indicated additional testing with an alternate test  methodology [phone]) is advised. The SARS-CoV-2 RNA is generally  detectable in upper and lower respiratory sp ecimens during the acute  phase of infection. The expected result is Negative. Fact Sheet for Patients:  StrictlyIdeas.no Fact Sheet for Healthcare Providers: BankingDealers.co.za This test is not yet approved or cleared by the [place] FDA and has been authorized for detection and/or diagnosis of SARS-CoV-2 by FDA under an Emergency Use  Authorization (EUA).  This EUA will remain in effect (meaning this test can be used) for the duration of the COVID-19 declaration under Section 564(b)(1) of the Act, 21 U.S.C. section 360bbb-3(b)(1), unless the authorization is terminated or revoked sooner. Performed at [facility], [place] [address], [place], [place] [address]   Blood culture (routine x 2)     Status: None (Preliminary result)   Collection Time: 10/04/18  1:21 PM   Specimen: BLOOD  Result Value Ref Range Status   Specimen Description BLOOD BLOOD RIGHT FOREARM  Final   Special Requests   Final    BOTTLES DRAWN AEROBIC AND  ANAEROBIC Blood Culture results may not be optimal due to an inadequate volume of blood received in culture bottles   Culture   Final    NO GROWTH 4 DAYS Performed at [place] [address], [place], [place] [address]    Report Status PENDING  Incomplete  Blood culture (routine x 2)     Status: None (Preliminary result)   Collection Time: 10/04/18  9:38 PM   Specimen: BLOOD LEFT HAND  Result Value Ref Range Status   Specimen Description BLOOD LEFT HAND  Final   Special Requests   Final    BOTTLES DRAWN AEROBIC ONLY Blood Culture results may not be optimal due to an inadequate volume of blood received in culture bottles   Culture   Final    NO GROWTH 4 DAYS Performed at [facility], [place] [address], [place], [place] [address]    Report Status PENDING  Incomplete         Radiology Studies: No results found.      Scheduled Meds: . chlorhexidine  15 mL Mouth Rinse BID  . mouth rinse  15 mL Mouth Rinse q12n4p  . polyvinyl alcohol  1 drop Both Eyes BID  . sodium chloride flush  3 mL Intravenous Q12H  . sodium chloride flush  3 mL Intravenous Q12H   Continuous Infusions: . sodium chloride    . sodium chloride    . dextrose Stopped (10/07/18 1100)     LOS: 4 days    Time spent: 35 mins.More than 50% of that time was spent in counseling and/or coordination of  care.      Shelly Coss, MD [facility] Pager [phone]  If 7PM-7AM, please contact night-coverage [email or web address] Password [email or web address] 10/08/2018, 1:17 PM

## 2018-10-08 NOTE — Progress Notes (Signed)
Manufacturing engineer [facility]) Hospital Liaison note.   Received request from Aldona Bar Mora for family interest in [facility]. Chart reviewed and spoke with niece Virgil Benedict to acknowledge referral.  Unfortunately [place] is not able to offer a room today. Family and CSW are aware [facility] liaison will follow up with CSW and family tomorrow or sooner if room becomes available. Please do not hesitate to call with questions.   Thank you,     Farrel Gordon, RN, [facility]   [place]     [place] are on AMION

## 2018-10-08 NOTE — Care Management Important Message (Signed)
Important Message  Patient Details  Name: [name] MRN: [id number] Date of Birth: 1940/12/27   Medicare Important Message Given:  Yes     Orbie Pyo 10/08/2018, 4:04 PM

## 2018-10-08 NOTE — Progress Notes (Signed)
NCM received consult:  PLease talk with niece at [place] about [place] ([place])       NCM spoke with niece Dalphine Handing (Chauncey [place]) regarding residential hospice. [place] agreeable to residential hospice for aunt. Choice given and [facility] selected. NCM made referral with [facility] for residential hospice care via voice message. Awaiting response . NCM to f/u with TOC needs. Whitman Hero RN,BSN,CM [phone]

## 2018-10-08 NOTE — Progress Notes (Signed)
Daily Progress Note   Patient Name: [name] [facility]: [facility]       Date: 10/08/2018 DOB: [birth year]  Age: 78 y.o. MRN#: [id number] Attending Physician: Shelly Coss, MD Primary Care Physician: Garwin Brothers, MD Admit Date: 10/04/2018  Reason for Consultation/Follow-up: Establishing goals of care and Psychosocial/spiritual support  Subjective: [name] tells me she is comfortable but having trouble breathing.  She does not want sedating medications right now (as her family is around) but she would like some medicine for breathing later this afternoon as her breathing has become more labored.   Assessment: Patient is dying from multi organ system failure.  She is taking sips.  Able to speak slowly thru very labored breathing.  Awake and orientated.  She is a good candidate for [employer].   Patient Profile/HPI:  78 yo female with PMT of DHF, COPD, OSA on home oxygen, CKD 4, DM, PUD, who has had 5 admits in the past 6 months presented to the ER on 9/24 with hypoxic and hypercapnic respiratory failure.  This was felt to be due to multifocal pneumonia and heart failure.  She also showed evidence of acute on chronic kidney disease.  She was not a candidate for dialysis or intubation.  Her CKD has worsened during this hospitalization due to diuresis that is necessary for her to breathe.    Length of Stay: 4  Current Medications: Scheduled Meds:  . chlorhexidine  15 mL Mouth Rinse BID  . mouth rinse  15 mL Mouth Rinse q12n4p  . polyvinyl alcohol  1 drop Both Eyes BID  . sodium chloride flush  3 mL Intravenous Q12H  . sodium chloride flush  3 mL Intravenous Q12H    Continuous Infusions: . sodium chloride    . sodium chloride    . dextrose Stopped (10/07/18 1100)    PRN Meds: sodium chloride,  sodium chloride, acetaminophen, antiseptic oral rinse, glycopyrrolate **OR** glycopyrrolate **OR** glycopyrrolate, haloperidol **OR** haloperidol **OR** haloperidol lactate, HYDROmorphone (DILAUDID) injection, ipratropium-albuterol, [DISCONTINUED] LORazepam **OR** [DISCONTINUED] LORazepam **OR** LORazepam, ondansetron (ZOFRAN) IV, polyvinyl alcohol, sodium chloride flush, sodium chloride flush  Physical Exam        Well developed very pleasant elderly female, family at bedside. A&O Breathing is labored on 6L Abdomen soft, obese non-tender.  Vital Signs: BP 130/68 (BP Location: Left Arm)  Pulse 66   Temp 97.9 F (36.6 C) (Oral)   Resp 18   Ht 5\' 4"  (1.626 m)   Wt 86.9 kg   SpO2 92%   BMI 32.88 kg/m  SpO2: SpO2: 92 % O2 Device: O2 Device: Nasal Cannula O2 Flow Rate: O2 Flow Rate (L/min): 6 L/min  Intake/output summary:   Intake/Output Summary (Last 24 hours) at 10/08/2018 1309 Last data filed at 10/08/2018 1300 Gross per 24 hour  Intake 352 ml  Output 651 ml  Net -299 ml   LBM: Last BM Date: 10/07/18 Baseline Weight: Weight: 90.7 kg Most recent weight: Weight: 86.9 kg       Palliative Assessment/Data: 20%      Patient Active Problem List   Diagnosis Date Noted  . Acute on chronic diastolic heart failure ([place])   . Comfort measures only status   . Hypotension 10/04/2018  . Goals of care, counseling/discussion   . Palliative care encounter   . Acute respiratory failure with hypoxemia ([place])   . Acute encephalopathy   . Acute respiratory failure with hypoxia and hypercapnia ([place]) 09/11/2018  . Shock circulatory ([place])   . Elevated brain natriuretic peptide (BNP) level 09/03/2018  . Troponin I above reference range 09/03/2018  . Acute on chronic combined systolic (congestive) and diastolic (congestive) heart failure ([place]) 09/03/2018  . Hyperkalemia 09/03/2018  . Acute renal failure superimposed on chronic kidney disease ([place])   . Pressure injury of skin 07/24/2018  .  Aortic atherosclerosis ([place]) 07/24/2018  . Occult blood positive stool   . Anemia due to GI blood loss 07/23/2018  . Essential hypertension 07/05/2018  . CHF (congestive heart failure), NYHA class IV, chronic, combined ([place]) 07/05/2018  . Diabetic hyperosmolar non-ketotic state ([place]) 06/17/2018  . Hyperglycemia 06/16/2018  . Type 2 diabetes mellitus ([place]) 06/16/2018  . UTI (urinary tract infection) 06/16/2018  . Sacral decubitus ulcer 06/16/2018  . Hypertensive urgency 04/04/2017  . CKD (chronic kidney disease) stage 4, GFR 15-29 ml/min (HCC) 04/04/2017  . Hypoglycemia   . Metabolic acidosis with normal anion gap and bicarbonate losses 11/23/2016  . Acute on chronic heart failure with preserved ejection fraction ([place]) 11/22/2016  . Midfoot ulcer, left, limited to breakdown of skin ([place]) 06/13/2016  . Idiopathic chronic venous hypertension of both lower extremities with inflammation 03/10/2016  . Onychomycosis 03/10/2016  . Chronic obstructive pulmonary disease ([place]) 11/07/2013  . Upper airway cough syndrome 11/07/2013  . Insomnia 12/10/2012  . OSA (obstructive sleep apnea) 09/17/2012  . Benign neoplasm of colon 08/14/2012  . Rectal bleeding 08/13/2012    Class: Acute  . Anemia of chronic disease 08/13/2012    Class: Acute  . Osteoarthritis 08/13/2012    Class: Chronic  . Acute posthemorrhagic anemia 08/13/2012  . Diverticulosis of colon (without mention of hemorrhage) 08/13/2012  . Hoarseness 07/20/2011  . COPD with asthma ([place]) 04/20/2011  . Allergic rhinitis 04/20/2011  . GI bleed 02/08/2011  . Personal history of colonic polyps 04/16/2010  . Fecal incontinence 03/23/2010  . Change in bowel habits 03/23/2010    Palliative Care Plan    Recommendations/Plan:  Continue current care.  She will need dilaudid to ease her breathing.  Do not escalate care or go up any further on oxygen.  Use dilaudid.  Good candidate for [facility] if HCPOA Lajuana Matte is agreeable.  Goals  of Care and Additional Recommendations:  Limitations on Scope of Treatment: Full Comfort Care  Code Status:  DNR  Prognosis:  Days to less than  2 weeks.  Discharge Planning:  Hospice facility vs Hospital death.    Thank you for allowing the Palliative Medicine Team to assist in the care of this patient.  Total time spent:  25 min.     Greater than 50%  of this time was spent counseling and coordinating care related to the above assessment and plan.  Florentina Jenny, PA-C Palliative Medicine  Please contact Palliative MedicineTeam phone at [phone] for questions and concerns between 7 am - 7 pm.   Please see AMION for individual provider pager numbers.

## 2018-10-09 LAB — CULTURE, BLOOD (ROUTINE X 2)
Culture: NO GROWTH
Culture: NO GROWTH

## 2018-10-09 NOTE — Progress Notes (Signed)
Nutrition Brief Note  Chart reviewed. Pt now transitioning to comfort care.  No further nutrition interventions warranted at this time.  Please re-consult as needed.   Anjannette Gauger A. Vaneta Hammontree, RD, LDN, CDCES Registered Dietitian II Certified Diabetes Care and Education Specialist Pager: [phone] After hours Pager: [phone]  

## 2018-10-09 NOTE — Progress Notes (Addendum)
[facility] room available for patient this morning. Will follow up with niece this morning to make plan to complete paper work prior to transfer to [employer]. Will update TOC manager Levada Dy.  Addendem: Spoke with with niece by phone at 9:00. She reports lots of family coming from out of town and will need to visit. Niece declined offer to transfer today due to she feels hospital visitation policy allows more visitors. Due to Covid-19 restrictions, [employer] visitation policy restricted to a total of four visitors with two visitors at a time. Updated TOC manager Levada Dy re family decision not to transfer.     Thank you,  Erling Conte, LCSW [phone]

## 2018-10-09 NOTE — Discharge Summary (Addendum)
Physician Discharge Summary  [name] [facility] URK:[id number] DOB: [birth year] DOA: 10/04/2018  PCP: Garwin Brothers, MD  Admit date: 10/04/2018 Discharge date: 10/09/2018  Admitted From: Home Disposition:  Residential hospice  Discharge Condition:Terminally ill CODE STATUS:DNR Diet recommendation:  Regular   Brief/Interim Summary:  Patient is a 78 year old female with history of CHF, COPD, diabetes type 2, anemia who presents with complaints of shortness of breath from [facility].  She was found to have generalized weakness, decreased level of consciousness at the nursing center.  She was hospitalized here on August and was discharged on 9/11 for CHF exacerbation requiring intubation.  She was found to be hypertensive , hypoxic on arrival.  She was placed on 100% nonrebreather.  ABG revealed pH of 7.19, PCO2 of 76, PO2 of 69.  Chest x-ray showed bilateral perihilar, basilar opacities concerning for edema/multifocal pneumonia.  PCCM, nephrology consulted on admission.  Nephrology did not offer her hemodialysis due to her poor prognosis.  PCCM did not want to intubate because she is not being dialyzed.  Patient made DNR after discussion with family.  Palliative care consulted.  Started on comfort care.  Plan is to transfer  her to residential hospice as soon as possible.  Following problems were addressed during her hospitalization:  Acute respiratory failure with hypoxia/hypercapnia: Most likely secondary to volume overload.  Chest x-ray showed bilateral opacities concerning for edema/infiltrate.  Zosyn was also started empirically for concern for multifocal pneumonia.  COVID-19-.  Due to acute respiratory failure, there was plan for intubation but after consulting PCCM, patient was not intubated because she will not be dialyzed which would make it impossible to correct her respiratory failure.  Off BiPAP.  Patient is DNR.  Acute on chronic congestive heart failure exacerbation: Appeared fluid  overloaded on presentation.  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  Elevated BNP.  Last ejection fraction in June 2020 was more than 65%.  Foley catheter was inserted.  She was given Lasix  but she did not respond well. Nephrology signed off.  AKI on CKD stage IV: Baseline creatinine 2.3-2.7.  Follows with [facility].  Patient had previously refused dialysis in case it was needed.  Elevated creatinine with 4.42 on admission with elevated BUN of 140.  Volume overloaded.  She needed dialysis on presentation but due to her comorbidities and poor prognosis, nephrology did not offer dialysis.  Palliative care was following  Hyperkalemia: Persistently elevated K due to ESRD.  Pressure wounds: Continue supportive care.  Frequent turning.  Debility/deconditioning/poor prognosis/multiple comorbidities: Nursing home resident.  Nonambulatory.  Has chronic debility.  Has ulcers on bilateral feet.   Since she has not been offered dialysis, she might succumb due to  respiratory failure due to fluid overload. Waiting for residential hospice   Discharge Diagnoses:  Principal Problem:   Acute respiratory failure with hypoxia and hypercapnia (HCC) Active Problems:   COPD with asthma ([place])   Acute on chronic heart failure with preserved ejection fraction (HCC)   Type 2 diabetes mellitus (HCC)   Hypotension   Acute on chronic diastolic heart failure (HCC)   Comfort measures only status    Discharge Instructions  Discharge Instructions    Diet general   Complete by: As directed      Allergies as of 10/09/2018      Reactions   Other Other (See Comments)   "all generics make her sick"      Medication List    STOP taking these medications   acetaminophen  325 MG tablet Commonly known as: TYLENOL   amLODipine 10 MG tablet Commonly known as: NORVASC   bisacodyl 10 MG suppository Commonly known as: DULCOLAX   calcitRIOL 0.25 MCG capsule Commonly known as: ROCALTROL    cyclobenzaprine 10 MG tablet Commonly known as: FLEXERIL   docusate sodium 100 MG capsule Commonly known as: COLACE   feeding supplement (GLUCERNA SHAKE) Liqd   Fish Oil 8938 MG Caps   folic acid 101 MCG tablet Commonly known as: FOLVITE   hydrALAZINE 50 MG tablet Commonly known as: APRESOLINE   insulin aspart 100 UNIT/ML injection Commonly known as: novoLOG   insulin glargine 100 UNIT/ML injection Commonly known as: LANTUS   ipratropium-albuterol 0.5-2.5 (3) MG/3ML Soln Commonly known as: DUONEB   iron polysaccharides 150 MG capsule Commonly known as: NIFEREX   isosorbide mononitrate 30 MG 24 hr tablet Commonly known as: IMDUR   magnesium oxide 400 MG tablet Commonly known as: MAG-OX   montelukast 10 MG tablet Commonly known as: Singulair   MULTIVITAMIN ADULT PO   nitroGLYCERIN 0.4 MG SL tablet Commonly known as: NITROSTAT   NOVOLOG FLEXPEN Dickinson   ondansetron 4 MG tablet Commonly known as: ZOFRAN   oxyCODONE-acetaminophen 5-325 MG tablet Commonly known as: PERCOCET/ROXICET   OXYGEN   pantoprazole 40 MG tablet Commonly known as: PROTONIX   polyethylene glycol 17 g packet Commonly known as: MIRALAX / GLYCOLAX   pyridOXINE 100 MG tablet Commonly known as: VITAMIN B-6   senna 8.6 MG Tabs tablet Commonly known as: SENOKOT   Systane Balance 0.6 % Soln Generic drug: Propylene Glycol   traZODone 50 MG tablet Commonly known as: DESYREL   vitamin B-12 1000 MCG tablet Commonly known as: CYANOCOBALAMIN   Vitamin D (Ergocalciferol) 1.25 MG (50000 UT) Caps capsule Commonly known as: DRISDOL   vitamin E 400 UNIT capsule   Zocor 20 MG tablet Generic drug: simvastatin       Allergies  Allergen Reactions  . Other Other (See Comments)    "all generics make her sick"    Consultations:  Palliative care,neprhology,PCCM   Procedures/Studies: Dg Chest 1 View  Result Date: 09/11/2018 CLINICAL DATA:  New left central line EXAM: CHEST  1 VIEW  COMPARISON:  09/11/2018, 11:57 a.m. FINDINGS: Interval placement of a left subclavian vascular catheter, tip projecting over the right atrium. Otherwise unchanged examination with endotracheal tube just above the carina, esophagogastric tube tip and side port below the diaphragm, cardiomegaly, pulmonary vascular prominence, retrocardiac opacity, and a possible small left pleural effusion. IMPRESSION: Interval placement of a left subclavian vascular catheter, tip projecting over the right atrium. Otherwise unchanged AP portable examination. Electronically Signed   By: Eddie Candle M.D.   On: 09/11/2018 14:03   Dg Abd 1 View  Result Date: 09/11/2018 CLINICAL DATA:  ETT, OG placement EXAM: PORTABLE CHEST 1 VIEW COMPARISON:  09/10/2018 FINDINGS: Interval placement of endotracheal tube, tip projecting above the carina. Otherwise unchanged AP portable examination of the chest with cardiomegaly, pulmonary vascular prominence, retrocardiac opacity reflecting atelectasis or consolidation, and a possible small left pleural effusion. Interval placement of orogastric tube with tip and side port below the diaphragm. Unremarkable pattern of partially imaged bowel gas. IMPRESSION: 1. Interval placement of endotracheal tube, tip projecting above the carina. 2. Otherwise unchanged AP portable examination of the chest with cardiomegaly, pulmonary vascular prominence, retrocardiac opacity reflecting atelectasis or consolidation, and a possible small left pleural effusion. 3. Interval placement of orogastric tube with tip and side port below the diaphragm. Electronically  Signed   By: Eddie Candle M.D.   On: 09/11/2018 12:14   US Renal  Result Date: 10/05/2018 CLINICAL DATA:  Acute kidney injury. EXAM: RENAL / URINARY TRACT ULTRASOUND COMPLETE COMPARISON:  11/24/16 FINDINGS: Right Kidney: Renal measurements: 8.6 x 4.2 x 4.4 cm = volume: 84 mL . Echogenicity within normal limits. No mass or hydronephrosis visualized. Left Kidney:  Renal measurements: 8.5 x 5.5 x 4.4 cm = volume: 107 mL. Simple lower pole cyst measuring 12 mm. No hydronephrosis or solid mass. Bladder: Prominent bladder wall thickness circumferentially attributed to under distension. A pathologic finding. IMPRESSION: 1. No acute or reversible finding. 2. Symmetric renal size with no cortical thinning. Electronically Signed   By: Monte Fantasia M.D.   On: 10/05/2018 05:42   Dg Chest Port 1 View  Result Date: 10/05/2018 CLINICAL DATA:  78 year old female with a history of difficulty breathing EXAM: PORTABLE CHEST 1 VIEW COMPARISON:  October 04, 2010 FINDINGS: Cardiomediastinal silhouette unchanged in size and contour. Calcifications of the aortic arch. No pneumothorax.  No pleural effusion. Fullness in the central vasculature with interlobular septal thickening, improved from the prior. No new confluent airspace disease. IMPRESSION: Similar appearance of cardiomegaly and mild edema, though improved from the prior. Electronically Signed   By: Corrie Mckusick D.O.   On: 10/05/2018 09:17   Dg Chest Port 1 View  Result Date: 10/04/2018 CLINICAL DATA:  Dyspnea. EXAM: PORTABLE CHEST 1 VIEW COMPARISON:  Radiographs of September 19, 2018. FINDINGS: Stable cardiomegaly. Atherosclerosis of thoracic aorta is noted. No pneumothorax is noted. Mild bilateral perihilar and basilar opacities are noted concerning for possible edema or possibly multifocal pneumonia. Minimal bilateral pleural effusions are noted. Degenerative changes are noted in the left glenohumeral joint. IMPRESSION: Stable cardiomegaly is noted. Mild bilateral perihilar and basilar opacities are noted concerning for possible edema or possibly multifocal pneumonia. Aortic Atherosclerosis (ICD10-I70.0). Electronically Signed   By: Marijo Conception M.D.   On: 10/04/2018 12:15   Dg Chest Port 1 View  Result Date: 09/19/2018 CLINICAL DATA:  Aspiration into airway EXAM: PORTABLE CHEST 1 VIEW COMPARISON:  09/17/2018  FINDINGS: Cardiomegaly with mild perihilar edema. Superimposed upper lobe/perihilar opacities along with left lower lobe/retrocardiac opacity, favoring multifocal pneumonia, possibly on the basis of aspiration. No definite pleural effusions. No pneumothorax. IMPRESSION: Cardiomegaly with mild perihilar edema. Superimposed multifocal patchy opacities in the upper lobes and left lower lobe, suspicious for multifocal pneumonia, possibly on the basis of aspiration given the clinical history. Electronically Signed   By: Julian Hy M.D.   On: 09/19/2018 18:56   Dg Chest Port 1 View  Result Date: 09/17/2018 CLINICAL DATA:  Tachypnea EXAM: PORTABLE CHEST 1 VIEW COMPARISON:  09/13/2018 FINDINGS: Cardiomegaly with vascular congestion and probable mild pulmonary edema, slightly improved since prior study. No effusions or acute bony abnormality. IMPRESSION: Cardiomegaly, vascular congestion and mild pulmonary edema, improved since prior study. Electronically Signed   By: Rolm Baptise M.D.   On: 09/17/2018 21:56   Dg Chest Port 1 View  Result Date: 09/13/2018 CLINICAL DATA:  ET position EXAM: PORTABLE CHEST 1 VIEW COMPARISON:  Radiograph 09/12/2018 FINDINGS: Patient is rotated in a steep left anterior oblique superimposing the mediastinal structures over the right chest and narrowing the left lung window. Interval retraction of the endotracheal tube now positioned approximately 2 cm from the carina. Transesophageal tube tip and side port appear distal to the GE junction, below the level of imaging. A left subclavian line terminates at the superior cavoatrial junction.  Persistent bilateral airspace opacities with hazy bibasilar atelectatic change with obscuration of the hemidiaphragms possibly reflecting a small amount of layering effusion. IMPRESSION: 1. Patient is rotated in a steep left anterior oblique superimposing the mediastinal structures over the right chest and narrowing of the left lung window. 2. Interval  retraction of the endotracheal tube now positioned approximately 2 cm from the carina. Could be retracted additional 1-2 cm to position in the mid trachea. These results will be called to the ordering clinician or representative by the Radiologist Assistant, and communication documented in the PACS or zVision Dashboard. Electronically Signed   By: Lovena Le M.D.   On: 09/13/2018 06:47   Dg Chest Port 1 View  Result Date: 09/12/2018 CLINICAL DATA:  Respiratory failure. EXAM: PORTABLE CHEST 1 VIEW COMPARISON:  Radiograph of September 11, 2018. FINDINGS: Stable cardiomegaly. Endotracheal tube is slightly directed toward right mainstem bronchus; withdrawal by 2-3 cm is recommended. Nasogastric tube is unchanged in position. Left subclavian catheter is unchanged. No definite pneumothorax is noted. Stable bibasilar atelectasis or infiltrates are noted. Possible small left pleural effusion is noted. Bony thorax is unremarkable. IMPRESSION: Endotracheal tube is slightly directed toward right mainstem bronchus; withdrawal by 2-3 cm is recommended. Mild bibasilar opacities are noted concerning for atelectasis or infiltrates, with possible small left pleural effusion. These results will be called to the ordering clinician or representative by the Radiologist Assistant, and communication documented in the PACS or zVision Dashboard. Electronically Signed   By: Marijo Conception M.D.   On: 09/12/2018 08:24   Dg Chest Port 1 View  Result Date: 09/11/2018 CLINICAL DATA:  ETT, OG placement EXAM: PORTABLE CHEST 1 VIEW COMPARISON:  09/10/2018 FINDINGS: Interval placement of endotracheal tube, tip projecting above the carina. Otherwise unchanged AP portable examination of the chest with cardiomegaly, pulmonary vascular prominence, retrocardiac opacity reflecting atelectasis or consolidation, and a possible small left pleural effusion. Interval placement of orogastric tube with tip and side port below the diaphragm. Unremarkable  pattern of partially imaged bowel gas. IMPRESSION: 1. Interval placement of endotracheal tube, tip projecting above the carina. 2. Otherwise unchanged AP portable examination of the chest with cardiomegaly, pulmonary vascular prominence, retrocardiac opacity reflecting atelectasis or consolidation, and a possible small left pleural effusion. 3. Interval placement of orogastric tube with tip and side port below the diaphragm. Electronically Signed   By: Eddie Candle M.D.   On: 09/11/2018 12:14   Dg Chest Port 1 View  Result Date: 09/10/2018 CLINICAL DATA:  Hypoxia. EXAM: PORTABLE CHEST 1 VIEW COMPARISON:  09/07/2018 FINDINGS: Patient's chin obscures the apices. Unchanged cardiomegaly. Pulmonary edema appears similar. Worsening retrocardiac and right lung base opacity. No pneumothorax. Degenerative change of the shoulders. IMPRESSION: 1. Worsening retrocardiac and right lung base opacity which may represent atelectasis and/or pleural fluid, particularly on the left. 2. Similar cardiomegaly and pulmonary edema. Electronically Signed   By: Keith Rake M.D.   On: 09/10/2018 23:03   Dg Esophagus W Single Cm (sol Or Thin Ba)  Result Date: 09/19/2018 CLINICAL DATA:  Difficulty swallowing. Choking sensation on solids. EXAM: ESOPHOGRAM/BARIUM SWALLOW TECHNIQUE: Single contrast examination was performed using  thin barium. FLUOROSCOPY TIME:  Fluoroscopy Time:  42 seconds Radiation Exposure Index (if provided by the fluoroscopic device): 23.20 mGy Number of Acquired Spot Images: 0 COMPARISON:  None. FINDINGS: Limited study due to patient's limited mobility. The esophagus was smooth in contour. The esophagus demonstrates very poor motility, with stasis of contrast, prominent tertiary contractions and reflux of contrast  toward the hypopharynx. There was no narrowing or stricture. There was no hiatal hernia. IMPRESSION: Poor motility of the esophagus with prominent tertiary contractions and reflux of contrast toward  the hypopharynx. Electronically Signed   By: Fidela Salisbury M.D.   On: 09/19/2018 13:48       Subjective:   Discharge Exam: Vitals:   10/08/18 1000 10/09/18 0525  BP:  131/62  Pulse:  71  Resp:  20  Temp:  98.4 F (36.9 C)  SpO2: 92% 93%   Vitals:   10/08/18 0730 10/08/18 0735 10/08/18 1000 10/09/18 0525  BP:    131/62  Pulse:    71  Resp:    20  Temp:    98.4 F (36.9 C)  TempSrc:    Oral  SpO2: (!) 89% 93% 92% 93%  Weight:      Height:        General: Pt is terminally ill Respiratory: decreased air entry    The results of significant diagnostics from this hospitalization (including imaging, microbiology, ancillary and laboratory) are listed below for reference.     Microbiology: Recent Results (from the past 240 hour(s))  SARS Coronavirus 2 [facility] order, Performed in [facility] hospital lab) Nasopharyngeal Nasopharyngeal Swab     Status: None   Collection Time: 10/04/18 12:40 PM   Specimen: Nasopharyngeal Swab  Result Value Ref Range Status   SARS Coronavirus 2 NEGATIVE NEGATIVE Final    Comment: (NOTE) If result is NEGATIVE SARS-CoV-2 target nucleic acids are NOT DETECTED. The SARS-CoV-2 RNA is generally detectable in upper and lower  respiratory specimens during the acute phase of infection. The lowest  concentration of SARS-CoV-2 viral copies this assay can detect is 250  copies / mL. A negative result does not preclude SARS-CoV-2 infection  and should not be used as the sole basis for treatment or other  patient management decisions.  A negative result may occur with  improper specimen collection / handling, submission of specimen other  than nasopharyngeal swab, presence of viral mutation(s) within the  areas targeted by this assay, and inadequate number of viral copies  (<250 copies / mL). A negative result must be combined with clinical  observations, patient history, and epidemiological information. If result is POSITIVE SARS-CoV-2  target nucleic acids are DETECTED. The SARS-CoV-2 RNA is generally detectable in upper and lower  respiratory specimens dur ing the acute phase of infection.  Positive  results are indicative of active infection with SARS-CoV-2.  Clinical  correlation with patient history and other diagnostic information is  necessary to determine patient infection status.  Positive results do  not rule out bacterial infection or co-infection with other viruses. If result is PRESUMPTIVE POSTIVE SARS-CoV-2 nucleic acids MAY BE PRESENT.   A presumptive positive result was obtained on the submitted specimen  and confirmed on repeat testing.  While 2019 novel coronavirus  (SARS-CoV-2) nucleic acids may be present in the submitted sample  additional confirmatory testing may be necessary for epidemiological  and / or clinical management purposes  to differentiate between  SARS-CoV-2 and other Sarbecovirus currently known to infect humans.  If clinically indicated additional testing with an alternate test  methodology [phone]) is advised. The SARS-CoV-2 RNA is generally  detectable in upper and lower respiratory sp ecimens during the acute  phase of infection. The expected result is Negative. Fact Sheet for Patients:  StrictlyIdeas.no Fact Sheet for Healthcare Providers: BankingDealers.co.za This test is not yet approved or cleared by the [place] FDA  and has been authorized for detection and/or diagnosis of SARS-CoV-2 by FDA under an Emergency Use Authorization (EUA).  This EUA will remain in effect (meaning this test can be used) for the duration of the COVID-19 declaration under Section 564(b)(1) of the Act, 21 U.S.C. section 360bbb-3(b)(1), unless the authorization is terminated or revoked sooner. Performed at [facility], [place] [address], [place], [place] [address]   Blood culture (routine x 2)     Status: None   Collection Time: 10/04/18   1:21 PM   Specimen: BLOOD  Result Value Ref Range Status   Specimen Description BLOOD BLOOD RIGHT FOREARM  Final   Special Requests   Final    BOTTLES DRAWN AEROBIC AND ANAEROBIC Blood Culture results may not be optimal due to an inadequate volume of blood received in culture bottles   Culture   Final    NO GROWTH 5 DAYS Performed at [facility], [place] [address] [place], [place] [address]    Report Status 10/09/2018 FINAL  Final  Blood culture (routine x 2)     Status: None   Collection Time: 10/04/18  9:38 PM   Specimen: BLOOD LEFT HAND  Result Value Ref Range Status   Specimen Description BLOOD LEFT HAND  Final   Special Requests   Final    BOTTLES DRAWN AEROBIC ONLY Blood Culture results may not be optimal due to an inadequate volume of blood received in culture bottles   Culture   Final    NO GROWTH 5 DAYS Performed at [facility], [place] [address], [place], [place] [address]    Report Status 10/09/2018 FINAL  Final     Labs: BNP (last 3 results) Recent Labs    09/18/18 0320 09/18/18 0441 10/04/18 1116  BNP 551.8* 441.5* 735.3*   Basic Metabolic Panel: Recent Labs  Lab 10/04/18 1115 10/04/18 1200 10/04/18 1919 10/05/18 0326 10/06/18 0251 10/07/18 0306  NA 143 143 143 144 145 146*  K 6.5* 6.2* 6.7* 6.7* 6.1* 5.8*  CL 107  --  107 107 108 109  CO2 23  --  23 25 25 24   GLUCOSE 179*  --  134* 136* 127* 128*  BUN 140*  --  143* 148* 159* 155*  CREATININE 4.42*  --  4.70* 4.88* 5.56* 5.40*  CALCIUM 8.9  --  9.0 9.0 8.9 8.7*  MG  --   --   --  2.7*  --   --   PHOS  --   --   --  6.4*  --   --    Liver Function Tests: Recent Labs  Lab 10/04/18 1115  AST 187*  ALT 140*  ALKPHOS 92  BILITOT 0.5  PROT 7.6  ALBUMIN 3.1*   No results for input(s): LIPASE, AMYLASE in the last 168 hours. No results for input(s): AMMONIA in the last 168 hours. CBC: Recent Labs  Lab 10/04/18 1115 10/04/18 1200 10/05/18 0326  WBC 7.8  --  6.9  NEUTROABS  --   --   6.3  HGB 10.1* 10.2* 9.1*  HCT 36.4 30.0* 29.4*  MCV 105.8*  --  95.8  PLT 232  --  167   Cardiac Enzymes: No results for input(s): CKTOTAL, CKMB, CKMBINDEX, TROPONINI in the last 168 hours. BNP: Invalid input(s): POCBNP CBG: Recent Labs  Lab 10/06/18 1305 10/06/18 1655 10/07/18 0012 10/07/18 0624 10/07/18 1220  GLUCAP 117* 123* 126* 112* 131*   D-Dimer No results for input(s): DDIMER  in the last 72 hours. Hgb A1c No results for input(s): HGBA1C in the last 72 hours. Lipid Profile No results for input(s): CHOL, HDL, LDLCALC, TRIG, CHOLHDL, LDLDIRECT in the last 72 hours. Thyroid function studies No results for input(s): TSH, T4TOTAL, T3FREE, THYROIDAB in the last 72 hours.  Invalid input(s): FREET3 Anemia work up No results for input(s): VITAMINB12, FOLATE, FERRITIN, TIBC, IRON, RETICCTPCT in the last 72 hours. Urinalysis    Component Value Date/Time   COLORURINE STRAW (A) 07/23/2018 1656   APPEARANCEUR CLEAR 07/23/2018 1656   LABSPEC 1.011 07/23/2018 1656   PHURINE 5.0 07/23/2018 1656   GLUCOSEU NEGATIVE 07/23/2018 1656   HGBUR NEGATIVE 07/23/2018 1656   BILIRUBINUR NEGATIVE 07/23/2018 1656   KETONESUR NEGATIVE 07/23/2018 1656   PROTEINUR NEGATIVE 07/23/2018 1656   UROBILINOGEN 0.2 12/16/2011 1717   NITRITE NEGATIVE 07/23/2018 1656   LEUKOCYTESUR NEGATIVE 07/23/2018 1656   Sepsis Labs Invalid input(s): PROCALCITONIN,  WBC,  LACTICIDVEN Microbiology Recent Results (from the past 240 hour(s))  SARS Coronavirus 2 [facility] order, Performed in [facility] hospital lab) Nasopharyngeal Nasopharyngeal Swab     Status: None   Collection Time: 10/04/18 12:40 PM   Specimen: Nasopharyngeal Swab  Result Value Ref Range Status   SARS Coronavirus 2 NEGATIVE NEGATIVE Final    Comment: (NOTE) If result is NEGATIVE SARS-CoV-2 target nucleic acids are NOT DETECTED. The SARS-CoV-2 RNA is generally detectable in upper and lower  respiratory specimens during the acute phase  of infection. The lowest  concentration of SARS-CoV-2 viral copies this assay can detect is 250  copies / mL. A negative result does not preclude SARS-CoV-2 infection  and should not be used as the sole basis for treatment or other  patient management decisions.  A negative result may occur with  improper specimen collection / handling, submission of specimen other  than nasopharyngeal swab, presence of viral mutation(s) within the  areas targeted by this assay, and inadequate number of viral copies  (<250 copies / mL). A negative result must be combined with clinical  observations, patient history, and epidemiological information. If result is POSITIVE SARS-CoV-2 target nucleic acids are DETECTED. The SARS-CoV-2 RNA is generally detectable in upper and lower  respiratory specimens dur ing the acute phase of infection.  Positive  results are indicative of active infection with SARS-CoV-2.  Clinical  correlation with patient history and other diagnostic information is  necessary to determine patient infection status.  Positive results do  not rule out bacterial infection or co-infection with other viruses. If result is PRESUMPTIVE POSTIVE SARS-CoV-2 nucleic acids MAY BE PRESENT.   A presumptive positive result was obtained on the submitted specimen  and confirmed on repeat testing.  While 2019 novel coronavirus  (SARS-CoV-2) nucleic acids may be present in the submitted sample  additional confirmatory testing may be necessary for epidemiological  and / or clinical management purposes  to differentiate between  SARS-CoV-2 and other Sarbecovirus currently known to infect humans.  If clinically indicated additional testing with an alternate test  methodology [phone]) is advised. The SARS-CoV-2 RNA is generally  detectable in upper and lower respiratory sp ecimens during the acute  phase of infection. The expected result is Negative. Fact Sheet for Patients:   StrictlyIdeas.no Fact Sheet for Healthcare Providers: BankingDealers.co.za This test is not yet approved or cleared by the [place] FDA and has been authorized for detection and/or diagnosis of SARS-CoV-2 by FDA under an Emergency Use Authorization (EUA).  This EUA will remain in effect (meaning  this test can be used) for the duration of the COVID-19 declaration under Section 564(b)(1) of the Act, 21 U.S.C. section 360bbb-3(b)(1), unless the authorization is terminated or revoked sooner. Performed at [facility], [place] [address], [place], [place] [address]   Blood culture (routine x 2)     Status: None   Collection Time: 10/04/18  1:21 PM   Specimen: BLOOD  Result Value Ref Range Status   Specimen Description BLOOD BLOOD RIGHT FOREARM  Final   Special Requests   Final    BOTTLES DRAWN AEROBIC AND ANAEROBIC Blood Culture results may not be optimal due to an inadequate volume of blood received in culture bottles   Culture   Final    NO GROWTH 5 DAYS Performed at [facility], [place] [address], [place], [place] [address]    Report Status 10/09/2018 FINAL  Final  Blood culture (routine x 2)     Status: None   Collection Time: 10/04/18  9:38 PM   Specimen: BLOOD LEFT HAND  Result Value Ref Range Status   Specimen Description BLOOD LEFT HAND  Final   Special Requests   Final    BOTTLES DRAWN AEROBIC ONLY Blood Culture results may not be optimal due to an inadequate volume of blood received in culture bottles   Culture   Final    NO GROWTH 5 DAYS Performed at [facility], [place] [address], [place], [place] [address]    Report Status 10/09/2018 FINAL  Final    Please note: You were cared for by a hospitalist during your hospital stay. Once you are discharged, your primary care physician will handle any further medical issues. Please note that NO REFILLS for any discharge medications will be authorized once you are  discharged, as it is imperative that you return to your primary care physician (or establish a relationship with a primary care physician if you do not have one) for your post hospital discharge needs so that they can reassess your need for medications and monitor your lab values.    Time coordinating discharge: 40 minutes  SIGNED:   Shelly Coss, MD  [facility] 10/09/2018, 7:52 AM Pager [phone]  If 7PM-7AM, please contact night-coverage [email or web address] Password [email or web address]

## 2018-10-09 NOTE — Progress Notes (Signed)
[name] has transitioned to comfort care, but is alert and oriented. [name] requested prayer. Chaplain offered prayer. [name] is slow with her speech. Her eyes had tears. [name] requested water. Chaplain passed on [name]'s request to the nurse. Chaplain remains available per request.   Chaplain Resident, Evelene Croon, M Div Pager # [phone] personal Pager # [phone]

## 2018-10-09 NOTE — Progress Notes (Signed)
Palliative Medicine RN Note: Rec'd a call from pt's family member Burman Blacksmith (niece [phone]). She is concerned about patient's discharge, as her elderly family members are on their way to see her. I reviewed pt's notes; discharge notes and orders are in, so I am limited in what I can do. She does have the important message paper, as [facility] is required to provide.  I spoke with Harmon Pier, [facility] liaison. She confirmed that they are only allowing 4 people to visit, and they cannot rotate out who those people are. Family has experienced more liberal liberation here and are not willing to go to [facility] if it means that her family will not get to see her.  Marjie Skiff Keifer Habib, RN, BSN, [facility] Palliative Medicine Team 10/09/2018 12:31 PM Office [phone]

## 2018-10-10 NOTE — Progress Notes (Addendum)
Patient has large amount of bleeding from vaginal area with large amount of blood clots. Patient denies pain in that area. BP 177/80, HR 79. Baltazar Najjar, NP notified via text page. No new orders at this time. Will continue to monitor.

## 2018-10-10 NOTE — Progress Notes (Signed)
PROGRESS NOTE    [name] [facility]  QQP:[id number] DOB: [birth year] DOA: 10/04/2018 PCP: Garwin Brothers, MD   Brief Narrative:  Patient is a 78 year old female with history of CHF, COPD, diabetes type 2, anemia who presents with complaints of shortness of breath from [facility]. She was found to have generalized weakness, decreased level of consciousness at the nursing center. She was hospitalized here on August and was discharged on 9/11 for CHF exacerbation requiring intubation. She was found to be hypertensive , hypoxic on arrival. She was placed on 100% nonrebreather. ABG revealed pH of 7.19, PCO2 of 76, PO2 of 69. Chest x-ray showed bilateral perihilar, basilar opacities concerning for edema/multifocal pneumonia. PCCM, nephrology consulted on admission. Nephrology did not offer her hemodialysis due to her poor prognosis. PCCM did not want to intubate because she is not being dialyzed. Patient made DNR after discussion with family. Palliative care consulted. Started oncomfort care. Plan is to transfer herto residential hospiceas soon as possible.   Assessment & Plan:   Principal Problem:   Acute respiratory failure with hypoxia and hypercapnia (HCC) Active Problems:   COPD with asthma (HCC)   Acute on chronic heart failure with preserved ejection fraction (HCC)   Type 2 diabetes mellitus (HCC)   Hypotension   Acute on chronic diastolic heart failure (HCC)   Comfort measures only status   Acute respiratory failure with hypoxia/hypercapnia:Most likely secondary to volume overload. Chest x-ray showed bilateral opacities concerning for edema/infiltrate. Zosyn was also started empirically for concern for multifocal pneumonia. COVID-19-. Due to acute respiratory failure, there was plan for intubation but after consulting PCCM, patient was not intubated because she will not be dialyzed which would make it impossible to correct her respiratory failure. OffBiPAP. Patient is  DNR.  Acute on chronic congestive heart failure exacerbation: Appeared fluid overloaded on presentation. 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 Elevated BNP. Last ejection fraction in June 2020 was more than 65%. Foley catheter was inserted. She was given Lasix but she did not respond well. Nephrology signed off.  AKI on CKD stage ZT:IWPYKDXI creatinine 2.3-2.7. Follows with [facility]. Patient had previously refused dialysis in case it was needed. Elevated creatinine with 4.42 on admission with elevated BUN of 140. Volume overloaded. She needed dialysis on presentation but due to her comorbidities and poor prognosis, nephrology did not offer dialysis. Palliative care was following  Hyperkalemia:Persistently elevated K due to ESRD.  Debility/deconditioning/poor prognosis/multiple comorbidities: Nursing home resident. Nonambulatory. Has chronic debility. Has ulcers on bilateral feet. Since she has not been offered dialysis, she will succumb due to respiratory failure due to fluid overload. Waiting for residential hospice.  DVT prophylaxis:Heparin Code Status: DNR Family Communication: Discussed with niece at the bed side on 10/07/18 Disposition Plan: Residential hospice vs hospital death.As per the report ,bed not available at [facility]   Consultants: Nephrology, Palliative care  Procedures: None  Antimicrobials:  Anti-infectives (From admission, onward)   Start     Dose/Rate Route Frequency Ordered Stop   10/04/18 1230  piperacillin-tazobactam (ZOSYN) IVPB 3.375 g     3.375 g 100 mL/hr over 30 Minutes Intravenous  Once 10/04/18 1219 10/04/18 1344      Subjective:  Patient seen and examined the bedside this morning.  Remains hemodynamically stable.  Looked comfortable, not in acute respiratory status.  Lethargic.  Tries to speak something and opens her eyes when calling her name   Objective: Vitals:   10/08/18 0735 10/08/18 1000 10/09/18  0525 10/10/18 3382  BP:   131/62 (!) 141/75  Pulse:   71 71  Resp:   20 18  Temp:   98.4 F (36.9 C) 98.4 F (36.9 C)  TempSrc:   Oral Oral  SpO2: 93% 92% 93%   Weight:      Height:        Intake/Output Summary (Last 24 hours) at 10/10/2018 1131 Last data filed at 10/10/2018 0524 Gross per 24 hour  Intake 120 ml  Output 700 ml  Net -580 ml   Filed Weights   10/04/18 2125 10/06/18 0500 10/07/18 0506  Weight: 85.9 kg 86.7 kg 86.9 kg    Examination:  General exam: Lethargic, obese,terminally ill Respiratory system: Bilateral decreased air entry Cardiovascular system: S1 & S2 heard, RRR. No JVD, murmurs, rubs, gallops or clicks.  Anasarca Gastrointestinal system: Abdomen is distended, soft and nontender.   extremities: Anasarca, no clubbing ,no cyanosis, ulcers on bilateral feet    Data Reviewed: I have personally reviewed following labs and imaging studies  CBC: Recent Labs  Lab 10/04/18 1115 10/04/18 1200 10/05/18 0326  WBC 7.8  --  6.9  NEUTROABS  --   --  6.3  HGB 10.1* 10.2* 9.1*  HCT 36.4 30.0* 29.4*  MCV 105.8*  --  95.8  PLT 232  --  196   Basic Metabolic Panel: Recent Labs  Lab 10/04/18 1115 10/04/18 1200 10/04/18 1919 10/05/18 0326 10/06/18 0251 10/07/18 0306  NA 143 143 143 144 145 146*  K 6.5* 6.2* 6.7* 6.7* 6.1* 5.8*  CL 107  --  107 107 108 109  CO2 23  --  23 25 25 24   GLUCOSE 179*  --  134* 136* 127* 128*  BUN 140*  --  143* 148* 159* 155*  CREATININE 4.42*  --  4.70* 4.88* 5.56* 5.40*  CALCIUM 8.9  --  9.0 9.0 8.9 8.7*  MG  --   --   --  2.7*  --   --   PHOS  --   --   --  6.4*  --   --    GFR: Estimated Creatinine Clearance: 9.5 mL/min (A) (by C-G formula based on SCr of 5.4 mg/dL (H)). Liver Function Tests: Recent Labs  Lab 10/04/18 1115  AST 187*  ALT 140*  ALKPHOS 92  BILITOT 0.5  PROT 7.6  ALBUMIN 3.1*   No results for input(s): LIPASE, AMYLASE in the last 168 hours. No results for input(s): AMMONIA in the last 168  hours. Coagulation Profile: No results for input(s): INR, PROTIME in the last 168 hours. Cardiac Enzymes: No results for input(s): CKTOTAL, CKMB, CKMBINDEX, TROPONINI in the last 168 hours. BNP (last 3 results) No results for input(s): PROBNP in the last 8760 hours. HbA1C: No results for input(s): HGBA1C in the last 72 hours. CBG: Recent Labs  Lab 10/06/18 1305 10/06/18 1655 10/07/18 0012 10/07/18 0624 10/07/18 1220  GLUCAP 117* 123* 126* 112* 131*   Lipid Profile: No results for input(s): CHOL, HDL, LDLCALC, TRIG, CHOLHDL, LDLDIRECT in the last 72 hours. Thyroid Function Tests: No results for input(s): TSH, T4TOTAL, FREET4, T3FREE, THYROIDAB in the last 72 hours. Anemia Panel: No results for input(s): VITAMINB12, FOLATE, FERRITIN, TIBC, IRON, RETICCTPCT in the last 72 hours. Sepsis Labs: Recent Labs  Lab 10/04/18 1919 10/05/18 0326  PROCALCITON 0.21 0.27  LATICACIDVEN 1.4  --     Recent Results (from the past 240 hour(s))  SARS Coronavirus 2 [facility] order, Performed in [facility] hospital lab) Nasopharyngeal Nasopharyngeal  Swab     Status: None   Collection Time: 10/04/18 12:40 PM   Specimen: Nasopharyngeal Swab  Result Value Ref Range Status   SARS Coronavirus 2 NEGATIVE NEGATIVE Final    Comment: (NOTE) If result is NEGATIVE SARS-CoV-2 target nucleic acids are NOT DETECTED. The SARS-CoV-2 RNA is generally detectable in upper and lower  respiratory specimens during the acute phase of infection. The lowest  concentration of SARS-CoV-2 viral copies this assay can detect is 250  copies / mL. A negative result does not preclude SARS-CoV-2 infection  and should not be used as the sole basis for treatment or other  patient management decisions.  A negative result may occur with  improper specimen collection / handling, submission of specimen other  than nasopharyngeal swab, presence of viral mutation(s) within the  areas targeted by this assay, and inadequate number  of viral copies  (<250 copies / mL). A negative result must be combined with clinical  observations, patient history, and epidemiological information. If result is POSITIVE SARS-CoV-2 target nucleic acids are DETECTED. The SARS-CoV-2 RNA is generally detectable in upper and lower  respiratory specimens dur ing the acute phase of infection.  Positive  results are indicative of active infection with SARS-CoV-2.  Clinical  correlation with patient history and other diagnostic information is  necessary to determine patient infection status.  Positive results do  not rule out bacterial infection or co-infection with other viruses. If result is PRESUMPTIVE POSTIVE SARS-CoV-2 nucleic acids MAY BE PRESENT.   A presumptive positive result was obtained on the submitted specimen  and confirmed on repeat testing.  While 2019 novel coronavirus  (SARS-CoV-2) nucleic acids may be present in the submitted sample  additional confirmatory testing may be necessary for epidemiological  and / or clinical management purposes  to differentiate between  SARS-CoV-2 and other Sarbecovirus currently known to infect humans.  If clinically indicated additional testing with an alternate test  methodology [phone]) is advised. The SARS-CoV-2 RNA is generally  detectable in upper and lower respiratory sp ecimens during the acute  phase of infection. The expected result is Negative. Fact Sheet for Patients:  StrictlyIdeas.no Fact Sheet for Healthcare Providers: BankingDealers.co.za This test is not yet approved or cleared by the [place] FDA and has been authorized for detection and/or diagnosis of SARS-CoV-2 by FDA under an Emergency Use Authorization (EUA).  This EUA will remain in effect (meaning this test can be used) for the duration of the COVID-19 declaration under Section 564(b)(1) of the Act, 21 U.S.C. section 360bbb-3(b)(1), unless the authorization is  terminated or revoked sooner. Performed at [facility], [place] [address], [place], [place] [address]   Blood culture (routine x 2)     Status: None   Collection Time: 10/04/18  1:21 PM   Specimen: BLOOD  Result Value Ref Range Status   Specimen Description BLOOD BLOOD RIGHT FOREARM  Final   Special Requests   Final    BOTTLES DRAWN AEROBIC AND ANAEROBIC Blood Culture results may not be optimal due to an inadequate volume of blood received in culture bottles   Culture   Final    NO GROWTH 5 DAYS Performed at [facility], [place] [address] [place], [place] [address]    Report Status 10/09/2018 FINAL  Final  Blood culture (routine x 2)     Status: None   Collection Time: 10/04/18  9:38 PM   Specimen: BLOOD LEFT HAND  Result Value Ref Range Status  Specimen Description BLOOD LEFT HAND  Final   Special Requests   Final    BOTTLES DRAWN AEROBIC ONLY Blood Culture results may not be optimal due to an inadequate volume of blood received in culture bottles   Culture   Final    NO GROWTH 5 DAYS Performed at [facility], [place] [address] [place], [place] [address]    Report Status 10/09/2018 FINAL  Final         Radiology Studies: No results found.      Scheduled Meds:  chlorhexidine  15 mL Mouth Rinse BID   mouth rinse  15 mL Mouth Rinse q12n4p   polyvinyl alcohol  1 drop Both Eyes BID   sodium chloride flush  3 mL Intravenous Q12H   sodium chloride flush  3 mL Intravenous Q12H   Continuous Infusions:  sodium chloride     sodium chloride     dextrose Stopped (10/07/18 1100)     LOS: 6 days    Time spent: 25 mins.More than 50% of that time was spent in counseling and/or coordination of care.      Shelly Coss, MD [facility] Pager [phone]  If 7PM-7AM, please contact night-coverage [email or web address] Password [email or web address] 10/10/2018, 11:31 AM

## 2018-10-10 NOTE — Progress Notes (Signed)
[facility]   Continue to follow for family interest in [facility]. Spoke with patient's niece Virgil Benedict yesterday by phone. Unfortunately [facility] is not able to offer a room today. [facility] manager Levada Dy updated. Will continue to follow and update as availability changes.   Thank you,  Erling Conte, LCSW [phone]

## 2018-10-11 MED ORDER — HYDROMORPHONE HCL 1 MG/ML IJ SOLN
0.5000 mg | INTRAMUSCULAR | Status: DC
  Administered 2018-10-11 – 2018-10-13 (×13): 0.5 mg via INTRAVENOUS
  Filled 2018-10-11 (×13): qty 0.5

## 2018-10-11 MED ORDER — HYDROMORPHONE HCL 1 MG/ML IJ SOLN
0.5000 mg | INTRAMUSCULAR | Status: DC | PRN
  Administered 2018-10-11 – 2018-10-12 (×2): 0.5 mg via INTRAVENOUS
  Filled 2018-10-11 (×2): qty 0.5

## 2018-10-11 NOTE — Progress Notes (Addendum)
NCM made aware no bed available @ [facility] today per Public house manager. NCM made attempt to call pt's niece Virgil Benedict to update and discuss disposition plan, call unsuccessful. Voice message left...awaiting return call. Rene Paci (Niece)     [phone]     Whitman Hero RN,BSN,CM [phone]

## 2018-10-11 NOTE — Progress Notes (Signed)
PROGRESS NOTE    [name] [facility]  UKG:[id number] DOB: June 12, 1940 DOA: 10/04/2018 PCP: Garwin Brothers, MD   Brief Narrative:  Patient is a 78 year old female with history of CHF, COPD, diabetes type 2, anemia who presents with complaints of shortness of breath from [facility] health. She was found to have generalized weakness, decreased level of consciousness at the nursing center. She was hospitalized here on August and was discharged on 9/11 for CHF exacerbation requiring intubation. She was found to be hypertensive , hypoxic on arrival. She was placed on 100% nonrebreather. ABG revealed pH of 7.19, PCO2 of 76, PO2 of 69. Chest x-ray showed bilateral perihilar, basilar opacities concerning for edema/multifocal pneumonia. PCCM, nephrology consulted on admission. Nephrology did not offer her hemodialysis due to her poor prognosis. PCCM did not want to intubate because she is not being dialyzed. Patient made DNR after discussion with family. Palliative care consulted. Started oncomfort care. Plan is to transfer herto residential hospiceas soon as possible.   Assessment & Plan:   Principal Problem:   Acute respiratory failure with hypoxia and hypercapnia (HCC) Active Problems:   COPD with asthma (HCC)   Acute on chronic heart failure with preserved ejection fraction (HCC)   Type 2 diabetes mellitus (HCC)   Hypotension   Acute on chronic diastolic heart failure (HCC)   Comfort measures only status   Acute respiratory failure with hypoxia/hypercapnia:Most likely secondary to volume overload. Chest x-ray showed bilateral opacities concerning for edema/infiltrate. Zosyn was also started empirically for concern for multifocal pneumonia. COVID-19-. Due to acute respiratory failure, there was plan for intubation but after consulting PCCM, patient was not intubated because she will not be dialyzed which would make it impossible to correct her respiratory failure. OffBiPAP. Patient is  DNR.  Acute on chronic congestive heart failure exacerbation: Appeared fluid overloaded on presentation. 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 Elevated BNP. Last ejection fraction in June 2020 was more than 65%. Foley catheter was inserted. She was given Lasix but she did not respond well. Nephrology signed off.  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 Follows with [facility]. Patient had previously refused dialysis in case it was needed. Elevated creatinine with 4.42 on admission with elevated BUN of 140. Volume overloaded. She needed dialysis on presentation but due to her comorbidities and poor prognosis, nephrology did not offer dialysis. Palliative care was following  Hyperkalemia:Persistently elevated K due to ESRD.  Debility/deconditioning/poor prognosis/multiple comorbidities: Nursing home resident. Nonambulatory. Has chronic debility. Has ulcers on bilateral feet. Since she has not been offered dialysis, she will succumb due to respiratory failure due to fluid overload. Waiting for residential hospice.  DVT prophylaxis:Heparin Code Status: DNR Family Communication: Discussed with niece on the phone today Disposition Plan: Residential hospice vs hospital death.As per the report ,bed not available at [facility].  will follow-up tomorrow   Consultants: Nephrology, Palliative care  Procedures: None  Antimicrobials:  Anti-infectives (From admission, onward)   Start     Dose/Rate Route Frequency Ordered Stop   10/04/18 1230  piperacillin-tazobactam (ZOSYN) IVPB 3.375 g     3.375 g 100 mL/hr over 30 Minutes Intravenous  Once 10/04/18 1219 10/04/18 1344      Subjective:  Seen and examined at bedside this morning.  She is unable to provide a history due to her encephalopathy.  No acute events overnight.   Objective: Vitals:   10/10/18 1219 10/10/18 2000 10/11/18 0200 10/11/18 1556  BP: 132/63 (!) 177/80  (!) 158/79  Pulse:  77 79 65   Resp: 19 (!) 38 (!) 30 (!) 28  Temp: 98.5 F (36.9 C)     TempSrc: Oral     SpO2: 96% 94% 94% 91%  Weight:      Height:        Intake/Output Summary (Last 24 hours) at 10/11/2018 1728 Last data filed at 10/11/2018 0900 Gross per 24 hour  Intake 186 ml  Output 200 ml  Net -14 ml   Filed Weights   10/04/18 2125 10/06/18 0500 10/07/18 0506  Weight: 85.9 kg 86.7 kg 86.9 kg    Examination:  General exam: Lethargic, obese, terminally ill Respiratory system: Decreased air entry bilaterally with some labored breath sounds and abdominal breathing Cardiovascular system: S1 & S2 heard, RRR. No JVD, murmurs, rubs, gallops or clicks.  Anasarca Gastrointestinal system: Abdomen is distended, soft and nontender.   extremities: Anasarca, no clubbing ,no cyanosis, ulcers on bilateral feet    Data Reviewed: I have personally reviewed following labs and imaging studies  CBC: Recent Labs  Lab 10/05/18 0326  WBC 6.9  NEUTROABS 6.3  HGB 9.1*  HCT 29.4*  MCV 95.8  PLT 268   Basic Metabolic Panel: Recent Labs  Lab 10/04/18 1919 10/05/18 0326 10/06/18 0251 10/07/18 0306  NA 143 144 145 146*  K 6.7* 6.7* 6.1* 5.8*  CL 107 107 108 109  CO2 23 25 25 24   GLUCOSE 134* 136* 127* 128*  BUN 143* 148* 159* 155*  CREATININE 4.70* 4.88* 5.56* 5.40*  CALCIUM 9.0 9.0 8.9 8.7*  MG  --  2.7*  --   --   PHOS  --  6.4*  --   --    GFR: Estimated Creatinine Clearance: 9.5 mL/min (A) (by C-G formula based on SCr of 5.4 mg/dL (H)). Liver Function Tests: No results for input(s): AST, ALT, ALKPHOS, BILITOT, PROT, ALBUMIN in the last 168 hours. No results for input(s): LIPASE, AMYLASE in the last 168 hours. No results for input(s): AMMONIA in the last 168 hours. Coagulation Profile: No results for input(s): INR, PROTIME in the last 168 hours. Cardiac Enzymes: No results for input(s): CKTOTAL, CKMB, CKMBINDEX, TROPONINI in the last 168 hours. BNP (last 3 results) No results for  input(s): PROBNP in the last 8760 hours. HbA1C: No results for input(s): HGBA1C in the last 72 hours. CBG: Recent Labs  Lab 10/06/18 1305 10/06/18 1655 10/07/18 0012 10/07/18 0624 10/07/18 1220  GLUCAP 117* 123* 126* 112* 131*   Lipid Profile: No results for input(s): CHOL, HDL, LDLCALC, TRIG, CHOLHDL, LDLDIRECT in the last 72 hours. Thyroid Function Tests: No results for input(s): TSH, T4TOTAL, FREET4, T3FREE, THYROIDAB in the last 72 hours. Anemia Panel: No results for input(s): VITAMINB12, FOLATE, FERRITIN, TIBC, IRON, RETICCTPCT in the last 72 hours. Sepsis Labs: Recent Labs  Lab 10/04/18 1919 10/05/18 0326  PROCALCITON 0.21 0.27  LATICACIDVEN 1.4  --     Recent Results (from the past 240 hour(s))  SARS Coronavirus 2 [facility] order, Performed in [facility] hospital lab) Nasopharyngeal Nasopharyngeal Swab     Status: None   Collection Time: 10/04/18 12:40 PM   Specimen: Nasopharyngeal Swab  Result Value Ref Range Status   SARS Coronavirus 2 NEGATIVE NEGATIVE Final    Comment: (NOTE) If result is NEGATIVE SARS-CoV-2 target nucleic acids are NOT DETECTED. The SARS-CoV-2 RNA is generally detectable in upper and lower  respiratory specimens during the acute phase of infection. The lowest  concentration of SARS-CoV-2 viral copies this assay can detect  is 250  copies / mL. A negative result does not preclude SARS-CoV-2 infection  and should not be used as the sole basis for treatment or other  patient management decisions.  A negative result may occur with  improper specimen collection / handling, submission of specimen other  than nasopharyngeal swab, presence of viral mutation(s) within the  areas targeted by this assay, and inadequate number of viral copies  (<250 copies / mL). A negative result must be combined with clinical  observations, patient history, and epidemiological information. If result is POSITIVE SARS-CoV-2 target nucleic acids are DETECTED. The  SARS-CoV-2 RNA is generally detectable in upper and lower  respiratory specimens dur ing the acute phase of infection.  Positive  results are indicative of active infection with SARS-CoV-2.  Clinical  correlation with patient history and other diagnostic information is  necessary to determine patient infection status.  Positive results do  not rule out bacterial infection or co-infection with other viruses. If result is PRESUMPTIVE POSTIVE SARS-CoV-2 nucleic acids MAY BE PRESENT.   A presumptive positive result was obtained on the submitted specimen  and confirmed on repeat testing.  While 2019 novel coronavirus  (SARS-CoV-2) nucleic acids may be present in the submitted sample  additional confirmatory testing may be necessary for epidemiological  and / or clinical management purposes  to differentiate between  SARS-CoV-2 and other Sarbecovirus currently known to infect humans.  If clinically indicated additional testing with an alternate test  methodology [phone]) is advised. The SARS-CoV-2 RNA is generally  detectable in upper and lower respiratory sp ecimens during the acute  phase of infection. The expected result is Negative. Fact Sheet for Patients:  StrictlyIdeas.no Fact Sheet for Healthcare Providers: BankingDealers.co.za This test is not yet approved or cleared by the [place] FDA and has been authorized for detection and/or diagnosis of SARS-CoV-2 by FDA under an Emergency Use Authorization (EUA).  This EUA will remain in effect (meaning this test can be used) for the duration of the COVID-19 declaration under Section 564(b)(1) of the Act, 21 U.S.C. section 360bbb-3(b)(1), unless the authorization is terminated or revoked sooner. Performed at [facility], [place] [address], [place], [place] [address]   Blood culture (routine x 2)     Status: None   Collection Time: 10/04/18  1:21 PM   Specimen: BLOOD  Result Value  Ref Range Status   Specimen Description BLOOD BLOOD RIGHT FOREARM  Final   Special Requests   Final    BOTTLES DRAWN AEROBIC AND ANAEROBIC Blood Culture results may not be optimal due to an inadequate volume of blood received in culture bottles   Culture   Final    NO GROWTH 5 DAYS Performed at [facility], [place] [address], [place], [place] [address]    Report Status 10/09/2018 FINAL  Final  Blood culture (routine x 2)     Status: None   Collection Time: 10/04/18  9:38 PM   Specimen: BLOOD LEFT HAND  Result Value Ref Range Status   Specimen Description BLOOD LEFT HAND  Final   Special Requests   Final    BOTTLES DRAWN AEROBIC ONLY Blood Culture results may not be optimal due to an inadequate volume of blood received in culture bottles   Culture   Final    NO GROWTH 5 DAYS Performed at [facility], [place] [address], [place], [place] [address]    Report Status 10/09/2018 FINAL  Final  Radiology Studies: No results found.      Scheduled Meds:  chlorhexidine  15 mL Mouth Rinse BID    HYDROmorphone (DILAUDID) injection  0.5 mg Intravenous Q4H   mouth rinse  15 mL Mouth Rinse q12n4p   polyvinyl alcohol  1 drop Both Eyes BID   sodium chloride flush  3 mL Intravenous Q12H   sodium chloride flush  3 mL Intravenous Q12H   Continuous Infusions:  sodium chloride     sodium chloride     dextrose Stopped (10/07/18 1100)     LOS: 7 days    Time spent: 25 mins.More than 50% of that time was spent in counseling and/or coordination of care.      Harold Hedge, MD [facility] Pager [phone]  If 7PM-7AM, please contact night-coverage [email or web address] Password [facility] 10/11/2018, 5:28 PM

## 2018-10-11 NOTE — Progress Notes (Signed)
[facility] Liaison Note:  Update on Bed availability: Unfortunately [facility] is not able to offer a room today. CSW Whitman Hero made aware. Left message to make Niece Virgil Benedict aware leaving my contact information for a call back to answer questions or concerns.  [facility] liaison will follow up with CSW and family tomorrow or sooner if room becomes available.   Please do not hesitate to call with questions.  Thank you.   Gar Ponto, RN [facility] Liaison  [place] are on AMION

## 2018-10-11 NOTE — Progress Notes (Signed)
Palliative Medicine RN Note: symptom check. Pt used dilaudid 4 times since midnight. Dr Hilma Favors added scheduled dilaudid; no need for continuous infusion yet.   No family at bedside. Discussed w RN Junie Panning.  Marjie Skiff Iyanni Hepp, RN, BSN, [facility] Palliative Medicine Team 10/11/2018 10:17 AM Office [phone]

## 2018-10-12 NOTE — Progress Notes (Signed)
Manufacturing engineer [facility])  [facility] does not have any bed availability today.  Family updated.   ACC will reach out to family and [facility] manager should a bed become available.  Venia Carbon RN, BSN, [facility] Liaison (in [place]) [phone]

## 2018-10-12 NOTE — Progress Notes (Signed)
Palliative Medicine RN Note: Symptom check. Pt is awake and can speak, but very weakly and often just stares off into space. She is objectively and subjectively dyspneic and requesting a dose of prn med for breathing (hydromorphone). I asked if she wants anything to eat or drink, and she said we don't have what she wants. Family reports she wants a slushie.  I requested RN bring hydromorphone. Pt's RN is at lunch, so charge RN will assist. I also brought pt an [place] Ice, hoping it will melt to a slushy consistency.   Plan for PMT follow up tomorrow, if patient remains admitted.  Marjie Skiff Dawaun Brancato, RN, BSN, [facility] Palliative Medicine Team 10/12/2018 3:21 PM Office [phone]

## 2018-10-12 NOTE — Progress Notes (Signed)
NCM did offer other options for residential hospice on yesterday  2/2 no bed @ [facility]  ( [facility]) to niece Dalphine Handing. [place] did apologize however was adamant about loved one going to [facility] only. NCM received text from [facility] today and was made aware  bed@ [email or web address] will be available on tomorrow, 10/04. NCM will continue to monitor for needs... Whitman Hero RN,BSN,CM [phone]

## 2018-10-12 NOTE — Progress Notes (Signed)
[facility]) Hospital Liaison Note  Update on Bed Availability at [facility]: Able to offer patient a bed on 10/13/18.  Patient niece Virgil Benedict) agreeable to transfer patient morning of 10/13/18.  Niece unable to meet this afternoon for [facility] registration paperwork, so plan is for an [facility] Liaison to touch base with niece at [phone] to set up meeting tomorrow morning before transfer. Whitman Hero, TOC/LCSW aware.  Please call for any hospice related concerns,  Thank you,  Gar Ponto, RN [facility] Liaison (on [place]) [phone]

## 2018-10-12 NOTE — Progress Notes (Signed)
PROGRESS NOTE    [name] [facility]  UYQ:[id number] DOB: 02-26-1940 DOA: 10/04/2018 PCP: Garwin Brothers, MD   Brief Narrative:  Patient is a 78 year old female with history of CHF, COPD, diabetes type 2, anemia who presents with complaints of shortness of breath from [facility]. She was found to have generalized weakness, decreased level of consciousness at the nursing center. She was hospitalized here on August and was discharged on 9/11 for CHF exacerbation requiring intubation. She was found to be hypertensive , hypoxic on arrival. She was placed on 100% nonrebreather. ABG revealed pH of 7.19, PCO2 of 76, PO2 of 69. Chest x-ray showed bilateral perihilar, basilar opacities concerning for edema/multifocal pneumonia. PCCM, nephrology consulted on admission. Nephrology did not offer her hemodialysis due to her poor prognosis. PCCM did not want to intubate because she is not being dialyzed. Patient made DNR after discussion with family. Palliative care consulted. Started oncomfort care. Plan is to transfer herto residential hospiceas soon as possible.   Assessment & Plan:   Principal Problem:   Acute respiratory failure with hypoxia and hypercapnia (HCC) Active Problems:   COPD with asthma (HCC)   Acute on chronic heart failure with preserved ejection fraction (HCC)   Type 2 diabetes mellitus (HCC)   Hypotension   Acute on chronic diastolic heart failure (HCC)   Comfort measures only status   Acute respiratory failure with hypoxia/hypercapnia:  stableMost likely secondary to volume overload. Chest x-ray showed bilateral opacities concerning for edema/infiltrate. Zosyn was also started empirically for concern for multifocal pneumonia. COVID-19-. Due to acute respiratory failure, there was plan for intubation but after consulting PCCM, patient was not intubated because she will not be dialyzed which would make it impossible to correct her respiratory failure. OffBiPAP.  Patient is DNR.  Acute on chronic congestive heart failure exacerbation: Appeared fluid overloaded on presentation. 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 Elevated BNP. Last ejection fraction in June 2020 was more than 65%. Foley catheter was inserted. She was given Lasix but she did not respond well. Nephrology signed off.  AKI on CKD stage GL:OVFIEPPI creatinine 2.3-2.7. Follows with [facility]. Patient had previously refused dialysis in case it was needed. Elevated creatinine with 4.42 on admission with elevated BUN of 140. Volume overloaded. She needed dialysis on presentation but due to her comorbidities and poor prognosis, nephrology did not offer dialysis. Palliative care was following  Hyperkalemia:Persistently elevated K due to ESRD.  Debility/deconditioning/poor prognosis/multiple comorbidities: Nursing home resident. Nonambulatory. Has chronic debility. Has ulcers on bilateral feet. Since she has not been offered dialysis, she will succumb due to respiratory failure due to fluid overload. Waiting for residential hospice.  DVT prophylaxis:Heparin Code Status: DNR Family Communication: Family did not answer the phone Disposition Plan: Bed at [facility] place for hospice should be available tomorrow per Case Manager.    Consultants: Nephrology, Palliative care  Procedures: None  Antimicrobials:  Anti-infectives (From admission, onward)   Start     Dose/Rate Route Frequency Ordered Stop   10/04/18 1230  piperacillin-tazobactam (ZOSYN) IVPB 3.375 g     3.375 g 100 mL/hr over 30 Minutes Intravenous  Once 10/04/18 1219 10/04/18 1344      Subjective:  Seen and examined at bedside this morning.  Denies any complaints today. Very limited interview due to mentation.    Objective: Vitals:   10/10/18 1219 10/10/18 2000 10/11/18 0200 10/11/18 1556  BP: 132/63 (!) 177/80  (!) 158/79  Pulse: 77 79 65   Resp: 19 (!) 38 (!)  30 (!) 28  Temp:  98.5 F (36.9 C)     TempSrc: Oral     SpO2: 96% 94% 94% 91%  Weight:      Height:        Intake/Output Summary (Last 24 hours) at 10/12/2018 1808 Last data filed at 10/12/2018 0500 Gross per 24 hour  Intake --  Output 750 ml  Net -750 ml   Filed Weights   10/04/18 2125 10/06/18 0500 10/07/18 0506  Weight: 85.9 kg 86.7 kg 86.9 kg    Examination:  General exam: Lethargic, obese, terminally ill Respiratory system: Decreased air entry bilaterally with some labored breath sounds and abdominal breathing Cardiovascular system: S1 & S2 heard, RRR. No JVD, murmurs, rubs, gallops or clicks.  Anasarca Gastrointestinal system: Abdomen is distended, soft and nontender.   extremities: Anasarca, no clubbing ,no cyanosis, ulcers on bilateral feet    Data Reviewed: I have personally reviewed following labs and imaging studies  CBC: No results for input(s): WBC, NEUTROABS, HGB, HCT, MCV, PLT in the last 168 hours. Basic Metabolic Panel: Recent Labs  Lab 10/06/18 0251 10/07/18 0306  NA 145 146*  K 6.1* 5.8*  CL 108 109  CO2 25 24  GLUCOSE 127* 128*  BUN 159* 155*  CREATININE 5.56* 5.40*  CALCIUM 8.9 8.7*   GFR: Estimated Creatinine Clearance: 9.5 mL/min (A) (by C-G formula based on SCr of 5.4 mg/dL (H)). Liver Function Tests: No results for input(s): AST, ALT, ALKPHOS, BILITOT, PROT, ALBUMIN in the last 168 hours. No results for input(s): LIPASE, AMYLASE in the last 168 hours. No results for input(s): AMMONIA in the last 168 hours. Coagulation Profile: No results for input(s): INR, PROTIME in the last 168 hours. Cardiac Enzymes: No results for input(s): CKTOTAL, CKMB, CKMBINDEX, TROPONINI in the last 168 hours. BNP (last 3 results) No results for input(s): PROBNP in the last 8760 hours. HbA1C: No results for input(s): HGBA1C in the last 72 hours. CBG: Recent Labs  Lab 10/06/18 1305 10/06/18 1655 10/07/18 0012 10/07/18 0624 10/07/18 1220  GLUCAP 117* 123* 126* 112*  131*   Lipid Profile: No results for input(s): CHOL, HDL, LDLCALC, TRIG, CHOLHDL, LDLDIRECT in the last 72 hours. Thyroid Function Tests: No results for input(s): TSH, T4TOTAL, FREET4, T3FREE, THYROIDAB in the last 72 hours. Anemia Panel: No results for input(s): VITAMINB12, FOLATE, FERRITIN, TIBC, IRON, RETICCTPCT in the last 72 hours. Sepsis Labs: No results for input(s): PROCALCITON, LATICACIDVEN in the last 168 hours.  Recent Results (from the past 240 hour(s))  SARS Coronavirus 2 [facility] order, Performed in [facility] hospital lab) Nasopharyngeal Nasopharyngeal Swab     Status: None   Collection Time: 10/04/18 12:40 PM   Specimen: Nasopharyngeal Swab  Result Value Ref Range Status   SARS Coronavirus 2 NEGATIVE NEGATIVE Final    Comment: (NOTE) If result is NEGATIVE SARS-CoV-2 target nucleic acids are NOT DETECTED. The SARS-CoV-2 RNA is generally detectable in upper and lower  respiratory specimens during the acute phase of infection. The lowest  concentration of SARS-CoV-2 viral copies this assay can detect is 250  copies / mL. A negative result does not preclude SARS-CoV-2 infection  and should not be used as the sole basis for treatment or other  patient management decisions.  A negative result may occur with  improper specimen collection / handling, submission of specimen other  than nasopharyngeal swab, presence of viral mutation(s) within the  areas targeted by this assay, and inadequate number of viral copies  (<250 copies /  mL). A negative result must be combined with clinical  observations, patient history, and epidemiological information. If result is POSITIVE SARS-CoV-2 target nucleic acids are DETECTED. The SARS-CoV-2 RNA is generally detectable in upper and lower  respiratory specimens dur ing the acute phase of infection.  Positive  results are indicative of active infection with SARS-CoV-2.  Clinical  correlation with patient history and other diagnostic  information is  necessary to determine patient infection status.  Positive results do  not rule out bacterial infection or co-infection with other viruses. If result is PRESUMPTIVE POSTIVE SARS-CoV-2 nucleic acids MAY BE PRESENT.   A presumptive positive result was obtained on the submitted specimen  and confirmed on repeat testing.  While 2019 novel coronavirus  (SARS-CoV-2) nucleic acids may be present in the submitted sample  additional confirmatory testing may be necessary for epidemiological  and / or clinical management purposes  to differentiate between  SARS-CoV-2 and other Sarbecovirus currently known to infect humans.  If clinically indicated additional testing with an alternate test  methodology [phone]) is advised. The SARS-CoV-2 RNA is generally  detectable in upper and lower respiratory sp ecimens during the acute  phase of infection. The expected result is Negative. Fact Sheet for Patients:  StrictlyIdeas.no Fact Sheet for Healthcare Providers: BankingDealers.co.za This test is not yet approved or cleared by the [place] FDA and has been authorized for detection and/or diagnosis of SARS-CoV-2 by FDA under an Emergency Use Authorization (EUA).  This EUA will remain in effect (meaning this test can be used) for the duration of the COVID-19 declaration under Section 564(b)(1) of the Act, 21 U.S.C. section 360bbb-3(b)(1), unless the authorization is terminated or revoked sooner. Performed at [facility], [place] [address], [place], [place] [address]   Blood culture (routine x 2)     Status: None   Collection Time: 10/04/18  1:21 PM   Specimen: BLOOD  Result Value Ref Range Status   Specimen Description BLOOD BLOOD RIGHT FOREARM  Final   Special Requests   Final    BOTTLES DRAWN AEROBIC AND ANAEROBIC Blood Culture results may not be optimal due to an inadequate volume of blood received in culture bottles   Culture    Final    NO GROWTH 5 DAYS Performed at [facility], [place] [address] [place], [place] [address]    Report Status 10/09/2018 FINAL  Final  Blood culture (routine x 2)     Status: None   Collection Time: 10/04/18  9:38 PM   Specimen: BLOOD LEFT HAND  Result Value Ref Range Status   Specimen Description BLOOD LEFT HAND  Final   Special Requests   Final    BOTTLES DRAWN AEROBIC ONLY Blood Culture results may not be optimal due to an inadequate volume of blood received in culture bottles   Culture   Final    NO GROWTH 5 DAYS Performed at [facility], [place] [address], [place], [place] [address]    Report Status 10/09/2018 FINAL  Final         Radiology Studies: No results found.      Scheduled Meds:  chlorhexidine  15 mL Mouth Rinse BID    HYDROmorphone (DILAUDID) injection  0.5 mg Intravenous Q4H   mouth rinse  15 mL Mouth Rinse q12n4p   polyvinyl alcohol  1 drop Both Eyes BID   sodium chloride flush  3 mL Intravenous Q12H   sodium chloride flush  3 mL Intravenous Q12H  Continuous Infusions:  sodium chloride     sodium chloride     dextrose Stopped (10/07/18 1100)     LOS: 8 days    Time spent: 20 mins.More than 50% of that time was spent in counseling and/or coordination of care.      Harold Hedge, DO [facility] Pager [phone]  If 7PM-7AM, please contact night-coverage [email or web address] Password [email or web address] 10/12/2018, 6:08 PM

## 2018-10-13 MED ORDER — HYDROMORPHONE HCL 1 MG/ML IJ SOLN: 0.5000 mg | INTRAMUSCULAR | 0 refills | Status: AC | PRN

## 2018-10-13 MED ORDER — HYDROMORPHONE HCL 1 MG/ML IJ SOLN: 0.5000 mg | INTRAMUSCULAR | 0 refills | Status: AC

## 2018-10-13 NOTE — Progress Notes (Signed)
Per Eve at [facility], pt will not need blue hard copies of scripts, but pt's DNR has expired and pt will need a new one.  CSW will fill it out and provide to RN who will page the provider to sign it.    CSW will leave pt's D/C packet (for [facility]) and the unsigned DNR on pt's chart and updated RN the provider can come to sign the DNR.  CSW will continue to follow for D/C needs.  Alphonse Guild. Marilynne Dupuis, LCSW, LCAS, CSI Transitions of Care Clinical Social Worker Care Coordination Department Ph: [phone]     '

## 2018-10-13 NOTE — Progress Notes (Addendum)
Per [employer] if pt has a peripheral IV it will need to be taken out.  RN aware and will take IV out.  Pt's transport via [facility] is scheduled for 2:45pm due to family's request that pt's next pain med administration be given (2:30pm) prior to D/C.  D/C packet on pt's chart.  [facility] has been called and scheduled.  [place] aware.  TOC Advanced Care Supervisor aware.  RN aware.  CSW will continue to follow for D/C needs.  Alphonse Guild. Jahnyla Parrillo, LCSW, LCAS, CSI Transitions of Care Clinical Social Worker Care Coordination Department Ph: [phone]

## 2018-10-13 NOTE — Progress Notes (Signed)
CSW received a call from Harmon Pier in admissions at [facility] stating the patient has been offered a bed and has been accepted and that the pt can arrive on 10/3.  The pt's accepting doctor is Dr. Tomasa Hosteller.  The room number will be TBD.  The number for report is [phone].  CSW will update RN and EDP.  Alphonse Guild. Stephany Poorman, Latanya Presser, LCAS Clinical Social Worker Ph: [phone]

## 2018-10-13 NOTE — TOC Transition Note (Signed)
Transition of Care [facility]) - CM/SW Discharge Note   Patient Details  Name: [name] MRN: [id number] Date of Birth: [birth year]  [facility] CM/SW Contact:  Claudine Mouton, LCSW Phone Number: 10/13/2018, 1:28 PM   Clinical Narrative:   CSW called pt's nice Miss Niece at ph: [phone] and updated her pt is D/C'ing to [facility].  [facility] called.      Barriers to Discharge: Family Issues(Awaiting [facility] family to see pt today, [facility] pt  refusing to go to [employer] today 2/2 [place] visitations restrictions. States ok for d/c 9/30.)   Patient Goals and CMS Choice        Discharge Placement                       Discharge Plan and Services                                     Social Determinants of Health (SDOH) Interventions     Readmission Risk Interventions Readmission Risk Prevention Plan 07/10/2018 06/18/2018  Transportation Screening - Complete  PCP or Specialist Appt within 3-5 Days Complete Not Complete  Not Complete comments - not yet ready for d/c  HRI or [place] Complete Not Complete  HRI or Home Care Consult comments - CM will follow for needs  Social Work Consult for [place] Planning/Counseling Complete Complete  Palliative Care Screening Not Applicable Not Applicable  Medication Review [employer]) - Complete  Some recent data might be hidden

## 2018-10-13 NOTE — Discharge Summary (Addendum)
Physician Discharge Summary  [name] ZOX:[id number] DOB: [birth year] DOA: 10/04/2018  PCP: Garwin Brothers, MD  Admit date: 10/04/2018 Discharge date: 10/13/2018  Admitted From: [place] Disposition:  [facility]  Discharge Condition: Hospice CODE STATUS: Comfort care Diet recommendation: Regular thin  Brief/Interim Summary: Patient is a 78 year old female with history of CHF, COPD, diabetes type 2, anemia who presents with complaints of shortness of breath from [facility]. She was found to have generalized weakness, decreased level of consciousness at the nursing center. She was hospitalized here on August and was discharged on 9/11 for CHF exacerbation requiring intubation. She was found to be hypertensive , hypoxic on arrival. She was placed on 100% nonrebreather. ABG revealed pH of 7.19, PCO2 of 76, PO2 of 69. Chest x-ray showed bilateral perihilar, basilar opacities concerning for edema/multifocal pneumonia. PCCM, nephrology consulted on admission. Nephrology did not offer her hemodialysis due to her poor prognosis. PCCM did not want to intubate because she is not being dialyzed. Patient made DNR after discussion with family. Palliative care consulted. Started oncomfort care. To be discharged to [facility].  Discharge Diagnoses:  Principal Problem:   Acute respiratory failure with hypoxia and hypercapnia (HCC) Active Problems:   COPD with asthma (HCC)   Acute on chronic heart failure with preserved ejection fraction (HCC)   Type 2 diabetes mellitus (HCC)   Hypotension   Acute on chronic diastolic heart failure (HCC)   Comfort measures only status  Physical Exam Constitutional:      Appearance: She is ill-appearing.  HENT:     Head:     Comments: Skin tear on bridge of nose    Mouth/Throat:     Comments: Dry Eyes:     Extraocular Movements: Extraocular movements intact.  Cardiovascular:     Rate and Rhythm: Normal rate and regular  rhythm.  Pulmonary:     Breath sounds: Normal breath sounds.     Comments: Somewhat labored Abdominal:     Palpations: Abdomen is soft.     Tenderness: There is no guarding.  Skin:    General: Skin is warm and dry.  Neurological:     Comments: Slowed mentation, responds appropriately to questioning      Discharge Instructions  Discharge Instructions    Diet - low sodium heart healthy   Complete by: As directed    Diet general   Complete by: As directed    Diet general   Complete by: As directed    Thin   Discharge instructions   Complete by: As directed    Discharged with comfort measures on hospice   Increase activity slowly   Complete by: As directed    Increase activity slowly   Complete by: As directed      Allergies as of 10/13/2018      Reactions   Other Other (See Comments)   "all generics make her sick"    Med Rec must be completed prior to using this SMART[employer]k       Allergies  Allergen Reactions  . Other Other (See Comments)    "all generics make her sick"    Consultations:  Critical care, nephrology, palliative care    Procedures/Studies: US Renal  Result Date: 10/05/2018 CLINICAL DATA:  Acute kidney injury. EXAM: RENAL / URINARY TRACT ULTRASOUND COMPLETE COMPARISON:  11/24/16 FINDINGS: Right Kidney: Renal measurements: 8.6 x 4.2 x 4.4 cm = volume: 84 mL . Echogenicity within normal limits. No mass or hydronephrosis visualized. Left Kidney: Renal  measurements: 8.5 x 5.5 x 4.4 cm = volume: 107 mL. Simple lower pole cyst measuring 12 mm. No hydronephrosis or solid mass. Bladder: Prominent bladder wall thickness circumferentially attributed to under distension. A pathologic finding. IMPRESSION: 1. No acute or reversible finding. 2. Symmetric renal size with no cortical thinning. Electronically Signed   By: Monte Fantasia M.D.   On: 10/05/2018 05:42   Dg Chest Port 1 View  Result Date: 10/05/2018 CLINICAL DATA:  78 year old female with a history  of difficulty breathing EXAM: PORTABLE CHEST 1 VIEW COMPARISON:  October 04, 2010 FINDINGS: Cardiomediastinal silhouette unchanged in size and contour. Calcifications of the aortic arch. No pneumothorax.  No pleural effusion. Fullness in the central vasculature with interlobular septal thickening, improved from the prior. No new confluent airspace disease. IMPRESSION: Similar appearance of cardiomegaly and mild edema, though improved from the prior. Electronically Signed   By: Corrie Mckusick D.O.   On: 10/05/2018 09:17   Dg Chest Port 1 View  Result Date: 10/04/2018 CLINICAL DATA:  Dyspnea. EXAM: PORTABLE CHEST 1 VIEW COMPARISON:  Radiographs of September 19, 2018. FINDINGS: Stable cardiomegaly. Atherosclerosis of thoracic aorta is noted. No pneumothorax is noted. Mild bilateral perihilar and basilar opacities are noted concerning for possible edema or possibly multifocal pneumonia. Minimal bilateral pleural effusions are noted. Degenerative changes are noted in the left glenohumeral joint. IMPRESSION: Stable cardiomegaly is noted. Mild bilateral perihilar and basilar opacities are noted concerning for possible edema or possibly multifocal pneumonia. Aortic Atherosclerosis (ICD10-I70.0). Electronically Signed   By: Marijo Conception M.D.   On: 10/04/2018 12:15   Dg Chest Port 1 View  Result Date: 09/19/2018 CLINICAL DATA:  Aspiration into airway EXAM: PORTABLE CHEST 1 VIEW COMPARISON:  09/17/2018 FINDINGS: Cardiomegaly with mild perihilar edema. Superimposed upper lobe/perihilar opacities along with left lower lobe/retrocardiac opacity, favoring multifocal pneumonia, possibly on the basis of aspiration. No definite pleural effusions. No pneumothorax. IMPRESSION: Cardiomegaly with mild perihilar edema. Superimposed multifocal patchy opacities in the upper lobes and left lower lobe, suspicious for multifocal pneumonia, possibly on the basis of aspiration given the clinical history. Electronically Signed   By:  Julian Hy M.D.   On: 09/19/2018 18:56   Dg Chest Port 1 View  Result Date: 09/17/2018 CLINICAL DATA:  Tachypnea EXAM: PORTABLE CHEST 1 VIEW COMPARISON:  09/13/2018 FINDINGS: Cardiomegaly with vascular congestion and probable mild pulmonary edema, slightly improved since prior study. No effusions or acute bony abnormality. IMPRESSION: Cardiomegaly, vascular congestion and mild pulmonary edema, improved since prior study. Electronically Signed   By: Rolm Baptise M.D.   On: 09/17/2018 21:56   Dg Esophagus W Single Cm (sol Or Thin Ba)  Result Date: 09/19/2018 CLINICAL DATA:  Difficulty swallowing. Choking sensation on solids. EXAM: ESOPHOGRAM/BARIUM SWALLOW TECHNIQUE: Single contrast examination was performed using  thin barium. FLUOROSCOPY TIME:  Fluoroscopy Time:  42 seconds Radiation Exposure Index (if provided by the fluoroscopic device): 23.20 mGy Number of Acquired Spot Images: 0 COMPARISON:  None. FINDINGS: Limited study due to patient's limited mobility. The esophagus was smooth in contour. The esophagus demonstrates very poor motility, with stasis of contrast, prominent tertiary contractions and reflux of contrast toward the hypopharynx. There was no narrowing or stricture. There was no hiatal hernia. IMPRESSION: Poor motility of the esophagus with prominent tertiary contractions and reflux of contrast toward the hypopharynx. Electronically Signed   By: Fidela Salisbury M.D.   On: 09/19/2018 13:48   (Echo, Carotid, EGD, Colonoscopy, ERCP)    Subjective:   Discharge  Exam: Vitals:   10/12/18 2209 10/13/18 0438  BP:  (!) 166/79  Pulse: 70 65  Resp:  (!) 22  Temp:  98.4 F (36.9 C)  SpO2: 100% 96%   Vitals:   10/11/18 0200 10/11/18 1556 10/12/18 2209 10/13/18 0438  BP:  (!) 158/79  (!) 166/79  Pulse: 65  70 65  Resp: (!) 30 (!) 28  (!) 22  Temp:    98.4 F (36.9 C)  TempSrc:    Oral  SpO2: 94% 91% 100% 96%  Weight:      Height:        General: Pt is alert, awake, not  in acute distress Cardiovascular: RRR, S1/S2 +, no rubs, no gallops Respiratory: CTA bilaterally, no wheezing, no rhonchi Abdominal: Soft, NT, ND, bowel sounds + Extremities: no edema, no cyanosis    The results of significant diagnostics from this hospitalization (including imaging, microbiology, ancillary and laboratory) are listed below for reference.     Microbiology: Recent Results (from the past 240 hour(s))  SARS Coronavirus 2 [facility] order, Performed in [facility] hospital lab) Nasopharyngeal Nasopharyngeal Swab     Status: None   Collection Time: 10/04/18 12:40 PM   Specimen: Nasopharyngeal Swab  Result Value Ref Range Status   SARS Coronavirus 2 NEGATIVE NEGATIVE Final    Comment: (NOTE) If result is NEGATIVE SARS-CoV-2 target nucleic acids are NOT DETECTED. The SARS-CoV-2 RNA is generally detectable in upper and lower  respiratory specimens during the acute phase of infection. The lowest  concentration of SARS-CoV-2 viral copies this assay can detect is 250  copies / mL. A negative result does not preclude SARS-CoV-2 infection  and should not be used as the sole basis for treatment or other  patient management decisions.  A negative result may occur with  improper specimen collection / handling, submission of specimen other  than nasopharyngeal swab, presence of viral mutation(s) within the  areas targeted by this assay, and inadequate number of viral copies  (<250 copies / mL). A negative result must be combined with clinical  observations, patient history, and epidemiological information. If result is POSITIVE SARS-CoV-2 target nucleic acids are DETECTED. The SARS-CoV-2 RNA is generally detectable in upper and lower  respiratory specimens dur ing the acute phase of infection.  Positive  results are indicative of active infection with SARS-CoV-2.  Clinical  correlation with patient history and other diagnostic information is  necessary to determine patient  infection status.  Positive results do  not rule out bacterial infection or co-infection with other viruses. If result is PRESUMPTIVE POSTIVE SARS-CoV-2 nucleic acids MAY BE PRESENT.   A presumptive positive result was obtained on the submitted specimen  and confirmed on repeat testing.  While 2019 novel coronavirus  (SARS-CoV-2) nucleic acids may be present in the submitted sample  additional confirmatory testing may be necessary for epidemiological  and / or clinical management purposes  to differentiate between  SARS-CoV-2 and other Sarbecovirus currently known to infect humans.  If clinically indicated additional testing with an alternate test  methodology [phone]) is advised. The SARS-CoV-2 RNA is generally  detectable in upper and lower respiratory sp ecimens during the acute  phase of infection. The expected result is Negative. Fact Sheet for Patients:  StrictlyIdeas.no Fact Sheet for Healthcare Providers: BankingDealers.co.za This test is not yet approved or cleared by the [place] FDA and has been authorized for detection and/or diagnosis of SARS-CoV-2 by FDA under an Emergency Use Authorization (EUA).  This EUA will  remain in effect (meaning this test can be used) for the duration of the COVID-19 declaration under Section 564(b)(1) of the Act, 21 U.S.C. section 360bbb-3(b)(1), unless the authorization is terminated or revoked sooner. Performed at [facility], [place] [address] [place], [place] [address]   Blood culture (routine x 2)     Status: None   Collection Time: 10/04/18  1:21 PM   Specimen: BLOOD  Result Value Ref Range Status   Specimen Description BLOOD BLOOD RIGHT FOREARM  Final   Special Requests   Final    BOTTLES DRAWN AEROBIC AND ANAEROBIC Blood Culture results may not be optimal due to an inadequate volume of blood received in culture bottles   Culture   Final    NO GROWTH 5 DAYS Performed at [facility], [place] [address], [place], [place] [address]    Report Status 10/09/2018 FINAL  Final  Blood culture (routine x 2)     Status: None   Collection Time: 10/04/18  9:38 PM   Specimen: BLOOD LEFT HAND  Result Value Ref Range Status   Specimen Description BLOOD LEFT HAND  Final   Special Requests   Final    BOTTLES DRAWN AEROBIC ONLY Blood Culture results may not be optimal due to an inadequate volume of blood received in culture bottles   Culture   Final    NO GROWTH 5 DAYS Performed at [facility], [place] [address], [place],  [address]    Report Status 10/09/2018 FINAL  Final     Labs: BNP (last 3 results) Recent Labs    09/18/18 0320 09/18/18 0441 10/04/18 1116  BNP 551.8* 441.5* 016.0*   Basic Metabolic Panel: Recent Labs  Lab 10/07/18 0306  NA 146*  K 5.8*  CL 109  CO2 24  GLUCOSE 128*  BUN 155*  CREATININE 5.40*  CALCIUM 8.7*   Liver Function Tests: No results for input(s): AST, ALT, ALKPHOS, BILITOT, PROT, ALBUMIN in the last 168 hours. No results for input(s): LIPASE, AMYLASE in the last 168 hours. No results for input(s): AMMONIA in the last 168 hours. CBC: No results for input(s): WBC, NEUTROABS, HGB, HCT, MCV, PLT in the last 168 hours. Cardiac Enzymes: No results for input(s): CKTOTAL, CKMB, CKMBINDEX, TROPONINI in the last 168 hours. BNP: Invalid input(s): POCBNP CBG: Recent Labs  Lab 10/06/18 1305 10/06/18 1655 10/07/18 0012 10/07/18 0624 10/07/18 1220  GLUCAP 117* 123* 126* 112* 131*   D-Dimer No results for input(s): DDIMER in the last 72 hours. Hgb A1c No results for input(s): HGBA1C in the last 72 hours. Lipid Profile No results for input(s): CHOL, HDL, LDLCALC, TRIG, CHOLHDL, LDLDIRECT in the last 72 hours. Thyroid function studies No results for input(s): TSH, T4TOTAL, T3FREE, THYROIDAB in the last 72 hours.  Invalid input(s): FREET3 Anemia work up No results for input(s): VITAMINB12, FOLATE, FERRITIN, TIBC,  IRON, RETICCTPCT in the last 72 hours. Urinalysis    Component Value Date/Time   COLORURINE STRAW (A) 07/23/2018 1656   APPEARANCEUR CLEAR 07/23/2018 1656   LABSPEC 1.011 07/23/2018 1656   PHURINE 5.0 07/23/2018 1656   GLUCOSEU NEGATIVE 07/23/2018 1656   HGBUR NEGATIVE 07/23/2018 1656   BILIRUBINUR NEGATIVE 07/23/2018 1656   KETONESUR NEGATIVE 07/23/2018 1656   PROTEINUR NEGATIVE 07/23/2018 1656   UROBILINOGEN 0.2 12/16/2011 1717   NITRITE NEGATIVE 07/23/2018 1656   LEUKOCYTESUR NEGATIVE 07/23/2018 1656   Sepsis Labs Invalid input(s): PROCALCITONIN,  WBC,  LACTICIDVEN Microbiology Recent Results (from the  past 240 hour(s))  SARS Coronavirus 2 [facility] order, Performed in [facility] hospital lab) Nasopharyngeal Nasopharyngeal Swab     Status: None   Collection Time: 10/04/18 12:40 PM   Specimen: Nasopharyngeal Swab  Result Value Ref Range Status   SARS Coronavirus 2 NEGATIVE NEGATIVE Final    Comment: (NOTE) If result is NEGATIVE SARS-CoV-2 target nucleic acids are NOT DETECTED. The SARS-CoV-2 RNA is generally detectable in upper and lower  respiratory specimens during the acute phase of infection. The lowest  concentration of SARS-CoV-2 viral copies this assay can detect is 250  copies / mL. A negative result does not preclude SARS-CoV-2 infection  and should not be used as the sole basis for treatment or other  patient management decisions.  A negative result may occur with  improper specimen collection / handling, submission of specimen other  than nasopharyngeal swab, presence of viral mutation(s) within the  areas targeted by this assay, and inadequate number of viral copies  (<250 copies / mL). A negative result must be combined with clinical  observations, patient history, and epidemiological information. If result is POSITIVE SARS-CoV-2 target nucleic acids are DETECTED. The SARS-CoV-2 RNA is generally detectable in upper and lower  respiratory specimens dur ing  the acute phase of infection.  Positive  results are indicative of active infection with SARS-CoV-2.  Clinical  correlation with patient history and other diagnostic information is  necessary to determine patient infection status.  Positive results do  not rule out bacterial infection or co-infection with other viruses. If result is PRESUMPTIVE POSTIVE SARS-CoV-2 nucleic acids MAY BE PRESENT.   A presumptive positive result was obtained on the submitted specimen  and confirmed on repeat testing.  While 2019 novel coronavirus  (SARS-CoV-2) nucleic acids may be present in the submitted sample  additional confirmatory testing may be necessary for epidemiological  and / or clinical management purposes  to differentiate between  SARS-CoV-2 and other Sarbecovirus currently known to infect humans.  If clinically indicated additional testing with an alternate test  methodology [phone]) is advised. The SARS-CoV-2 RNA is generally  detectable in upper and lower respiratory sp ecimens during the acute  phase of infection. The expected result is Negative. Fact Sheet for Patients:  StrictlyIdeas.no Fact Sheet for Healthcare Providers: BankingDealers.co.za This test is not yet approved or cleared by the [place] FDA and has been authorized for detection and/or diagnosis of SARS-CoV-2 by FDA under an Emergency Use Authorization (EUA).  This EUA will remain in effect (meaning this test can be used) for the duration of the COVID-19 declaration under Section 564(b)(1) of the Act, 21 U.S.C. section 360bbb-3(b)(1), unless the authorization is terminated or revoked sooner. Performed at [facility], [place] [address], [place], [place] [address]   Blood culture (routine x 2)     Status: None   Collection Time: 10/04/18  1:21 PM   Specimen: BLOOD  Result Value Ref Range Status   Specimen Description BLOOD BLOOD RIGHT FOREARM  Final   Special  Requests   Final    BOTTLES DRAWN AEROBIC AND ANAEROBIC Blood Culture results may not be optimal due to an inadequate volume of blood received in culture bottles   Culture   Final    NO GROWTH 5 DAYS Performed at [facility], [place] [address] [place], [place] [address]    Report Status 10/09/2018 FINAL  Final  Blood culture (routine x 2)     Status: None   Collection Time:  10/04/18  9:38 PM   Specimen: BLOOD LEFT HAND  Result Value Ref Range Status   Specimen Description BLOOD LEFT HAND  Final   Special Requests   Final    BOTTLES DRAWN AEROBIC ONLY Blood Culture results may not be optimal due to an inadequate volume of blood received in culture bottles   Culture   Final    NO GROWTH 5 DAYS Performed at [facility], [place] [address] [place], [place] [address]    Report Status 10/09/2018 FINAL  Final     Time coordinating discharge: Less than 30 minutes  SIGNED:   Harold Hedge, D.O. [facility] 10/13/2018, 11:44 AM

## 2018-10-13 NOTE — Progress Notes (Signed)
Report called to [facility] receiving nurse. All questions answered.

## 2018-11-11 DEATH — deceased

## 2019-02-27 LAB — BLOOD GAS, ARTERIAL
Acid-Base Excess: 10.1 mmol/L — ABNORMAL HIGH (ref 0.0–2.0)
Acid-Base Excess: 6.4 mmol/L — ABNORMAL HIGH (ref 0.0–2.0)
Bicarbonate: 34.4 mmol/L — ABNORMAL HIGH (ref 20.0–28.0)
Bicarbonate: 35.1 mmol/L — ABNORMAL HIGH (ref 20.0–28.0)
Delivery systems: POSITIVE
Drawn by: 232811
Drawn by: 295031
FIO2: 100
MECHVT: 450 mL
O2 Content: 6 L/min
O2 Saturation: 81.3 %
PEEP: 10 cmH2O
Patient temperature: 98.6
Patient temperature: 98.8
RATE: 20 resp/min
pCO2 arterial: 51 mmHg — ABNORMAL HIGH (ref 32.0–48.0)
pCO2 arterial: 77.4 mmHg (ref 32.0–48.0)
pH, Arterial: 7.271 — ABNORMAL LOW (ref 7.350–7.450)
pH, Arterial: 7.452 — ABNORMAL HIGH (ref 7.350–7.450)
pO2, Arterial: 309 mmHg — ABNORMAL HIGH (ref 83.0–108.0)
pO2, Arterial: 48.2 mmHg — ABNORMAL LOW (ref 83.0–108.0)

## 2021-06-02 IMAGING — DX PORTABLE CHEST - 1 VIEW
1 series · 1 of 1 positions shown · non-contrast
Comparison: 04/03/2017

CLINICAL DATA: Weakness 1 week. Swelling bilateral lower
extremities.

EXAM:
PORTABLE CHEST 1 VIEW

[chest ap]
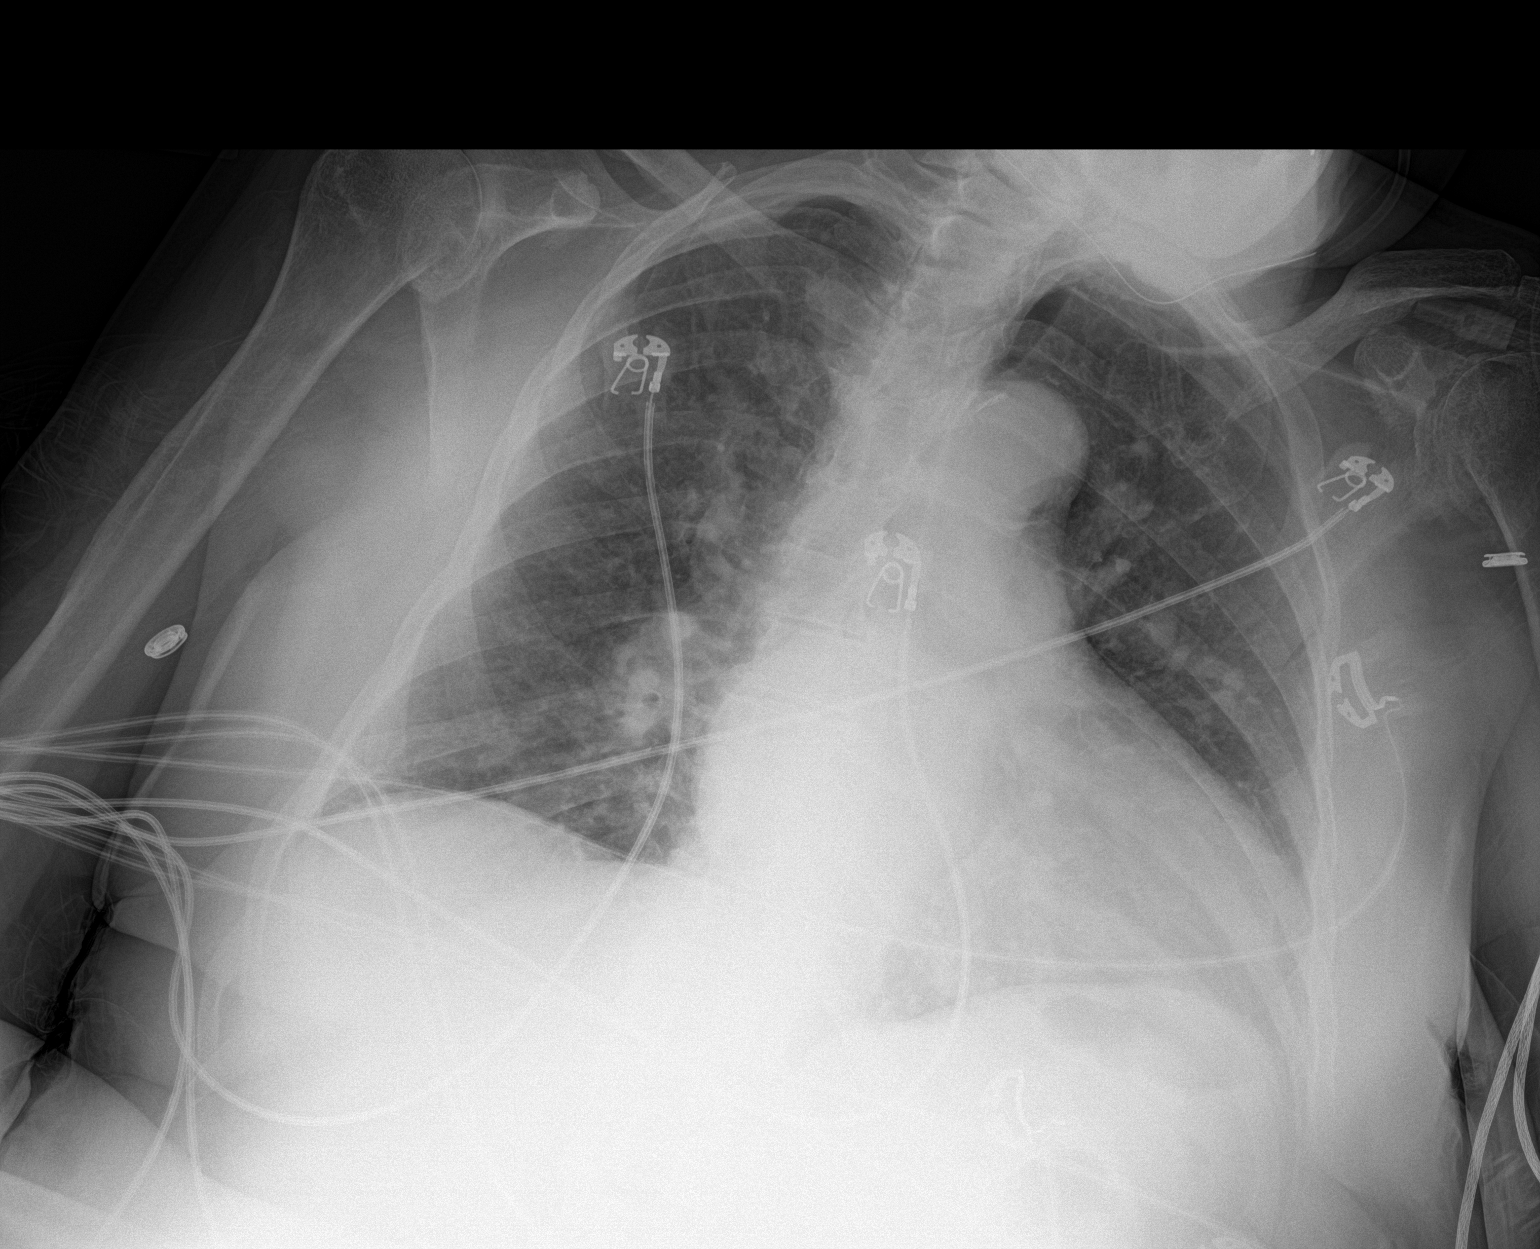

[1 of 1 positions shown; findings below may reference images not displayed]

FINDINGS: Patient is rotated to the left. Lungs are adequately inflated with
subtle hazy opacification of the central pulmonary vasculature
likely mild vascular congestion which is improved. No evidence of
effusion or lobar consolidation. Mild cardiomegaly. Remainder of the
exam is unchanged.
IMPRESSION: Mild cardiomegaly with interval improved mild vascular congestion.

## 2021-08-18 IMAGING — DX CHEST - 2 VIEW
2 series · 2 of 2 positions shown · non-contrast
Comparison: 08/01/2018

CLINICAL DATA: Chills and cough.

EXAM:
CHEST - 2 VIEW

[chest lat]
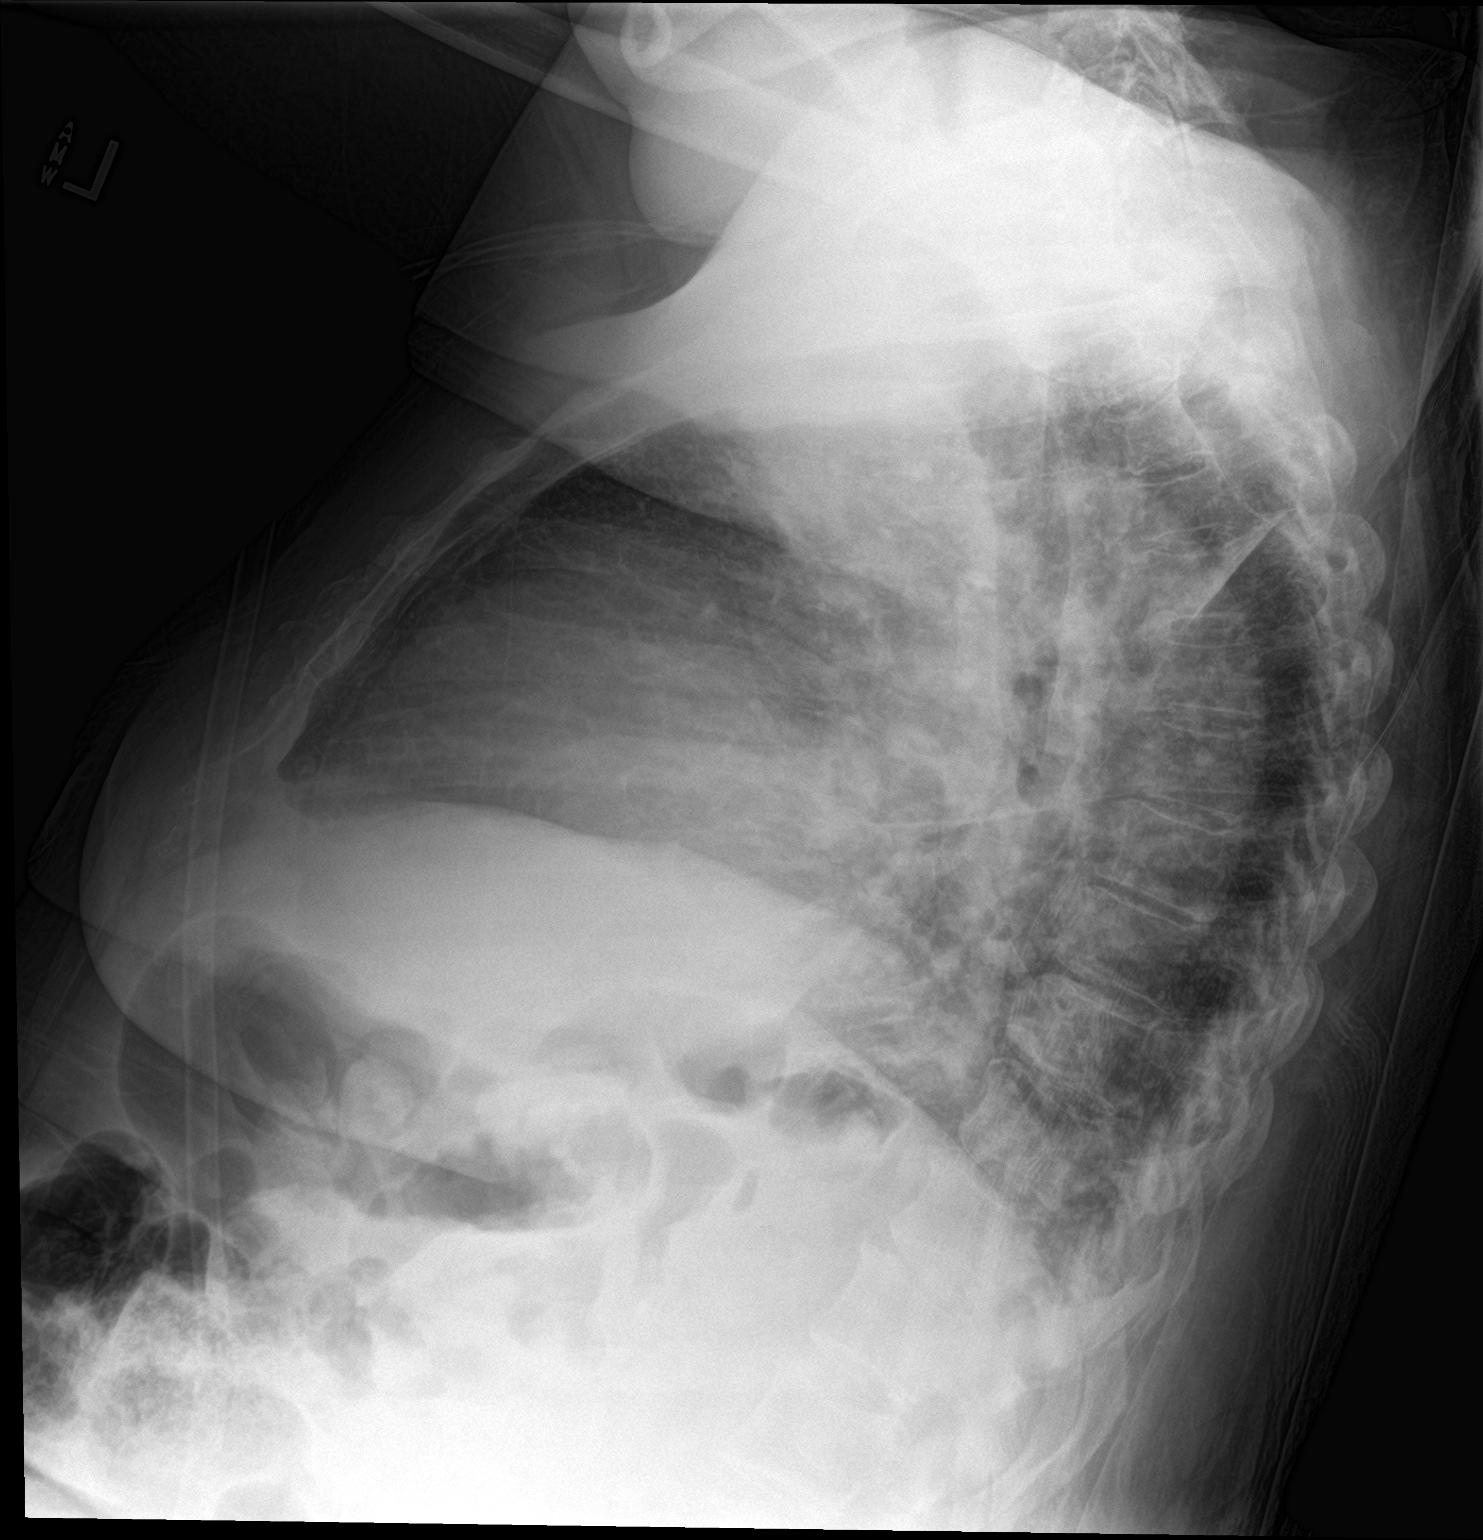

[chest ap]
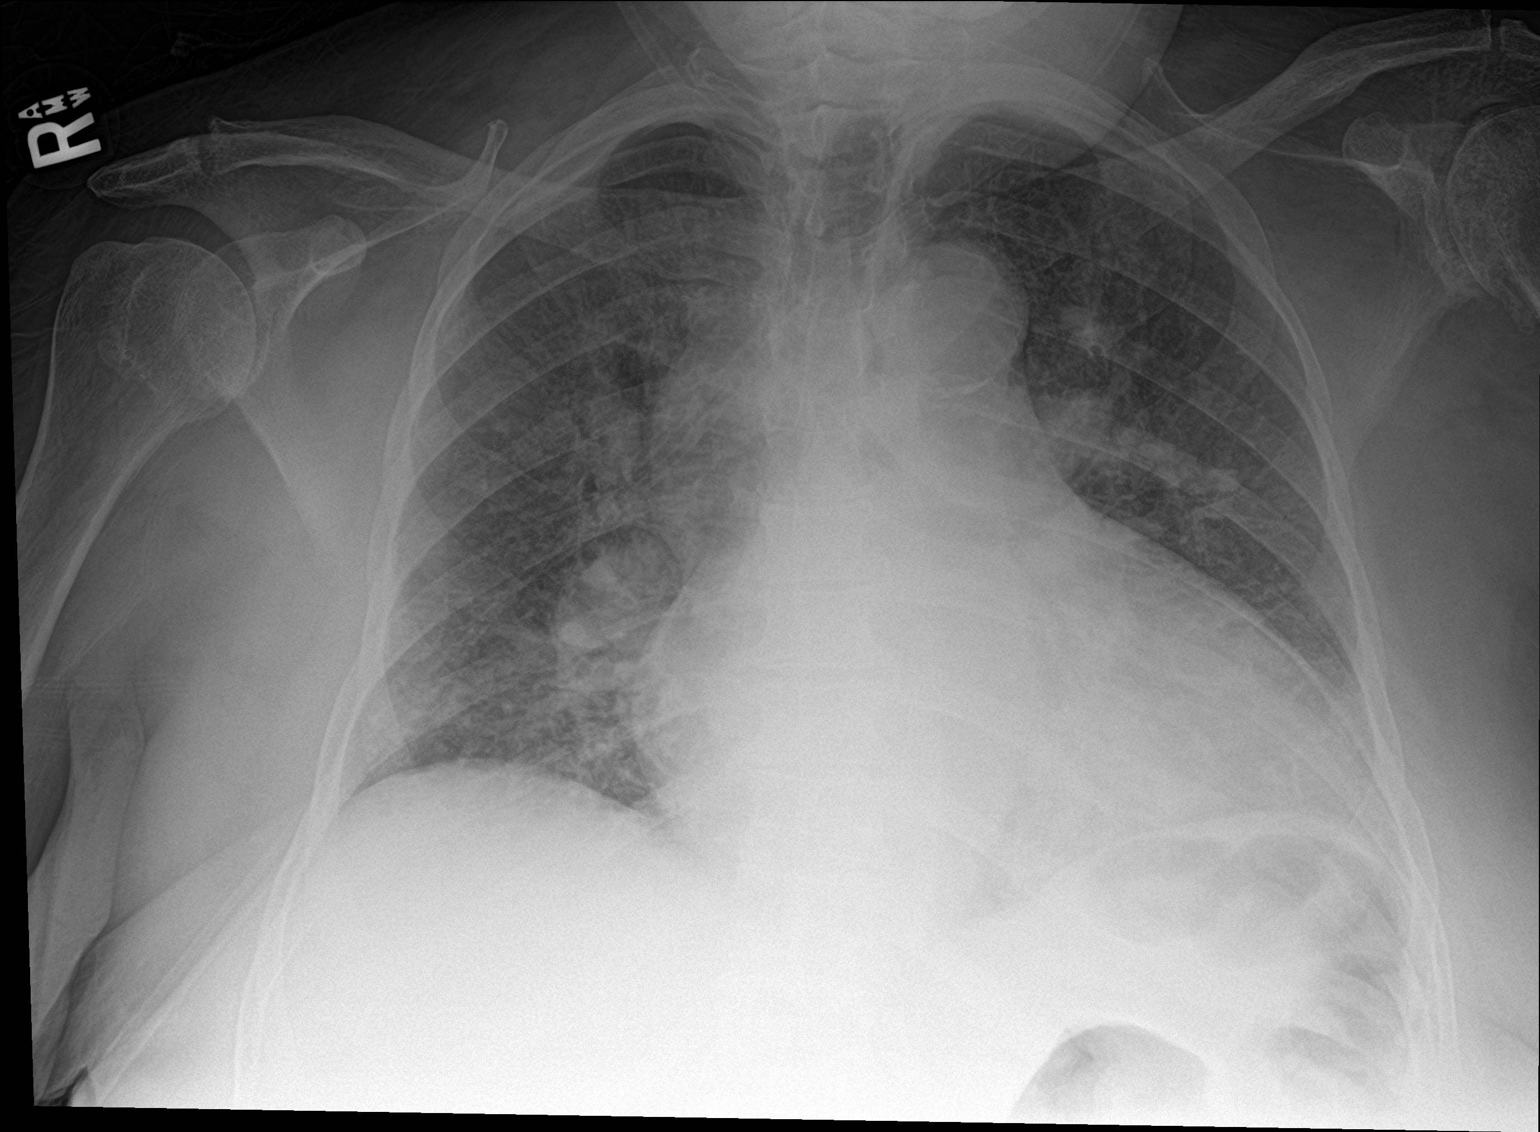

[2 of 2 positions shown; findings below may reference images not displayed]

FINDINGS: Low volumes. The cardio pericardial silhouette is enlarged.
Pulmonary vascular congestion noted with interstitial pulmonary
edema pattern. No substantial pleural effusion. The visualized bony
structures of the thorax are intact. Degenerative changes noted left
shoulder.
IMPRESSION: Cardiomegaly with interstitial pulmonary edema.
